# Patient Record
Sex: Male | Born: 1935
Health system: Southern US, Community
[De-identification: ages and names within clinical notes are randomized; demographics above are authoritative.]

## PROBLEM LIST (undated history)

## (undated) DIAGNOSIS — Z860100 Personal history of colon polyps, unspecified: Secondary | ICD-10-CM

## (undated) DIAGNOSIS — N486 Induration penis plastica: Secondary | ICD-10-CM

## (undated) DIAGNOSIS — I10 Essential (primary) hypertension: Secondary | ICD-10-CM

## (undated) DIAGNOSIS — N289 Disorder of kidney and ureter, unspecified: Secondary | ICD-10-CM

## (undated) DIAGNOSIS — G4733 Obstructive sleep apnea (adult) (pediatric): Secondary | ICD-10-CM

## (undated) DIAGNOSIS — Z860101 Personal history of adenomatous and serrated colon polyps: Secondary | ICD-10-CM

## (undated) DIAGNOSIS — I2510 Atherosclerotic heart disease of native coronary artery without angina pectoris: Secondary | ICD-10-CM

## (undated) DIAGNOSIS — M479 Spondylosis, unspecified: Secondary | ICD-10-CM

## (undated) DIAGNOSIS — M2550 Pain in unspecified joint: Secondary | ICD-10-CM

## (undated) DIAGNOSIS — R972 Elevated prostate specific antigen [PSA]: Secondary | ICD-10-CM

## (undated) DIAGNOSIS — N200 Calculus of kidney: Secondary | ICD-10-CM

## (undated) DIAGNOSIS — N401 Enlarged prostate with lower urinary tract symptoms: Secondary | ICD-10-CM

## (undated) DIAGNOSIS — R3914 Feeling of incomplete bladder emptying: Secondary | ICD-10-CM

## (undated) DIAGNOSIS — D696 Thrombocytopenia, unspecified: Secondary | ICD-10-CM

## (undated) DIAGNOSIS — E785 Hyperlipidemia, unspecified: Secondary | ICD-10-CM

## (undated) DIAGNOSIS — Z8719 Personal history of other diseases of the digestive system: Secondary | ICD-10-CM

## (undated) DIAGNOSIS — R351 Nocturia: Secondary | ICD-10-CM

## (undated) DIAGNOSIS — K449 Diaphragmatic hernia without obstruction or gangrene: Secondary | ICD-10-CM

## (undated) DIAGNOSIS — D72821 Monocytosis (symptomatic): Secondary | ICD-10-CM

## (undated) DIAGNOSIS — Z87438 Personal history of other diseases of male genital organs: Secondary | ICD-10-CM

## (undated) DIAGNOSIS — R300 Dysuria: Secondary | ICD-10-CM

## (undated) DIAGNOSIS — D510 Vitamin B12 deficiency anemia due to intrinsic factor deficiency: Secondary | ICD-10-CM

## (undated) DIAGNOSIS — A419 Sepsis, unspecified organism: Secondary | ICD-10-CM

## (undated) DIAGNOSIS — K5730 Diverticulosis of large intestine without perforation or abscess without bleeding: Secondary | ICD-10-CM

## (undated) DIAGNOSIS — E539 Vitamin B deficiency, unspecified: Secondary | ICD-10-CM

## (undated) DIAGNOSIS — Z8679 Personal history of other diseases of the circulatory system: Secondary | ICD-10-CM

## (undated) DIAGNOSIS — N138 Other obstructive and reflux uropathy: Secondary | ICD-10-CM

## (undated) DIAGNOSIS — K1379 Other lesions of oral mucosa: Secondary | ICD-10-CM

## (undated) DIAGNOSIS — M1990 Unspecified osteoarthritis, unspecified site: Secondary | ICD-10-CM

## (undated) HISTORY — DX: Calculus of kidney: N20.0

## (undated) HISTORY — DX: Vitamin B12 deficiency anemia due to intrinsic factor deficiency: D51.0

## (undated) HISTORY — DX: Hyperlipidemia, unspecified: E78.5

## (undated) HISTORY — PX: TOTAL KNEE ARTHROPLASTY: SHX125

## (undated) HISTORY — DX: Thrombocytopenia, unspecified: D69.6

## (undated) HISTORY — DX: Diverticulosis of large intestine without perforation or abscess without bleeding: K57.30

## (undated) HISTORY — DX: Personal history of colon polyps, unspecified: Z86.0100

## (undated) HISTORY — DX: Essential (primary) hypertension: I10

## (undated) HISTORY — PX: KNEE ARTHROSCOPY: SUR90

## (undated) HISTORY — DX: Unspecified osteoarthritis, unspecified site: M19.90

## (undated) HISTORY — DX: Personal history of colonic polyps: Z86.010

## (undated) HISTORY — PX: CARDIOVASCULAR STRESS TEST: SHX262

## (undated) HISTORY — PX: SATURATION BIOPSY OF PROSTATE: SHX2375

## (undated) HISTORY — DX: Atherosclerotic heart disease of native coronary artery without angina pectoris: I25.10

---
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
## 1991-01-19 HISTORY — PX: UVULOPALATOPHARYNGOPLASTY: SHX827

## 1998-01-18 HISTORY — PX: CARDIAC CATHETERIZATION: SHX172

## 1998-09-25 ENCOUNTER — Inpatient Hospital Stay (HOSPITAL_COMMUNITY): Admission: EM | Admit: 1998-09-25 | Discharge: 1998-09-26 | Payer: Self-pay | Admitting: *Deleted

## 1998-10-01 ENCOUNTER — Ambulatory Visit (HOSPITAL_COMMUNITY): Admission: RE | Admit: 1998-10-01 | Discharge: 1998-10-01 | Payer: Self-pay | Admitting: Cardiology

## 1998-10-01 ENCOUNTER — Encounter: Payer: Self-pay | Admitting: Cardiology

## 2001-06-30 ENCOUNTER — Encounter: Admission: RE | Admit: 2001-06-30 | Discharge: 2001-07-28 | Payer: Self-pay | Admitting: Internal Medicine

## 2001-10-12 ENCOUNTER — Ambulatory Visit (HOSPITAL_BASED_OUTPATIENT_CLINIC_OR_DEPARTMENT_OTHER): Admission: RE | Admit: 2001-10-12 | Discharge: 2001-10-12 | Payer: Self-pay | Admitting: Internal Medicine

## 2001-10-25 ENCOUNTER — Encounter: Payer: Self-pay | Admitting: Internal Medicine

## 2001-10-25 ENCOUNTER — Ambulatory Visit (HOSPITAL_COMMUNITY): Admission: RE | Admit: 2001-10-25 | Discharge: 2001-10-25 | Payer: Self-pay | Admitting: Internal Medicine

## 2002-01-23 ENCOUNTER — Encounter: Admission: RE | Admit: 2002-01-23 | Discharge: 2002-01-23 | Payer: Self-pay | Admitting: Surgery

## 2002-01-23 ENCOUNTER — Encounter: Payer: Self-pay | Admitting: Surgery

## 2002-01-25 ENCOUNTER — Ambulatory Visit (HOSPITAL_BASED_OUTPATIENT_CLINIC_OR_DEPARTMENT_OTHER): Admission: RE | Admit: 2002-01-25 | Discharge: 2002-01-25 | Payer: Self-pay | Admitting: Surgery

## 2002-01-25 HISTORY — PX: INGUINAL HERNIA REPAIR: SUR1180

## 2002-12-25 ENCOUNTER — Encounter: Admission: RE | Admit: 2002-12-25 | Discharge: 2002-12-25 | Payer: Self-pay | Admitting: Internal Medicine

## 2003-01-02 ENCOUNTER — Encounter: Admission: RE | Admit: 2003-01-02 | Discharge: 2003-01-02 | Payer: Self-pay | Admitting: Internal Medicine

## 2003-05-02 ENCOUNTER — Inpatient Hospital Stay (HOSPITAL_COMMUNITY): Admission: RE | Admit: 2003-05-02 | Discharge: 2003-05-05 | Payer: Self-pay | Admitting: Orthopedic Surgery

## 2003-05-23 ENCOUNTER — Ambulatory Visit (HOSPITAL_COMMUNITY): Admission: RE | Admit: 2003-05-23 | Discharge: 2003-05-23 | Payer: Self-pay | Admitting: Family Medicine

## 2003-12-02 ENCOUNTER — Encounter: Admission: RE | Admit: 2003-12-02 | Discharge: 2003-12-02 | Payer: Self-pay | Admitting: Neurosurgery

## 2004-05-18 ENCOUNTER — Ambulatory Visit: Payer: Self-pay | Admitting: Cardiology

## 2004-06-05 ENCOUNTER — Ambulatory Visit: Payer: Self-pay

## 2004-10-08 ENCOUNTER — Ambulatory Visit: Payer: Self-pay | Admitting: Internal Medicine

## 2004-11-12 ENCOUNTER — Encounter: Admission: RE | Admit: 2004-11-12 | Discharge: 2004-11-12 | Payer: Self-pay | Admitting: Orthopedic Surgery

## 2004-11-13 ENCOUNTER — Ambulatory Visit: Payer: Self-pay | Admitting: Internal Medicine

## 2004-11-19 ENCOUNTER — Ambulatory Visit (HOSPITAL_BASED_OUTPATIENT_CLINIC_OR_DEPARTMENT_OTHER): Admission: RE | Admit: 2004-11-19 | Discharge: 2004-11-20 | Payer: Self-pay | Admitting: Orthopedic Surgery

## 2004-11-19 ENCOUNTER — Ambulatory Visit (HOSPITAL_COMMUNITY): Admission: RE | Admit: 2004-11-19 | Discharge: 2004-11-19 | Payer: Self-pay | Admitting: Orthopedic Surgery

## 2004-11-19 HISTORY — PX: SHOULDER ARTHROSCOPY WITH OPEN ROTATOR CUFF REPAIR AND DISTAL CLAVICLE ACROMINECTOMY: SHX5683

## 2004-12-22 ENCOUNTER — Ambulatory Visit: Payer: Self-pay | Admitting: Internal Medicine

## 2004-12-22 ENCOUNTER — Ambulatory Visit: Payer: Self-pay | Admitting: Cardiology

## 2004-12-23 ENCOUNTER — Ambulatory Visit: Payer: Self-pay

## 2004-12-23 ENCOUNTER — Encounter: Payer: Self-pay | Admitting: Cardiology

## 2004-12-23 HISTORY — PX: TRANSTHORACIC ECHOCARDIOGRAM: SHX275

## 2004-12-31 ENCOUNTER — Ambulatory Visit: Payer: Self-pay | Admitting: *Deleted

## 2005-02-26 ENCOUNTER — Ambulatory Visit: Payer: Self-pay | Admitting: Cardiology

## 2005-03-03 ENCOUNTER — Ambulatory Visit: Payer: Self-pay | Admitting: Cardiology

## 2005-05-19 ENCOUNTER — Ambulatory Visit: Payer: Self-pay | Admitting: Cardiology

## 2005-06-28 ENCOUNTER — Ambulatory Visit: Payer: Self-pay | Admitting: Cardiology

## 2005-11-22 ENCOUNTER — Ambulatory Visit: Payer: Self-pay | Admitting: Cardiology

## 2005-11-24 ENCOUNTER — Ambulatory Visit: Payer: Self-pay | Admitting: Internal Medicine

## 2006-05-17 ENCOUNTER — Ambulatory Visit: Payer: Self-pay | Admitting: Internal Medicine

## 2006-05-17 LAB — CONVERTED CEMR LAB
ALT: 14 units/L (ref 0–40)
AST: 18 units/L (ref 0–37)
Albumin: 4 g/dL (ref 3.5–5.2)
Alkaline Phosphatase: 86 units/L (ref 39–117)
BUN: 10 mg/dL (ref 6–23)
Basophils Absolute: 0 10*3/uL (ref 0.0–0.1)
Basophils Relative: 0.1 % (ref 0.0–1.0)
Bilirubin, Direct: 0.2 mg/dL (ref 0.0–0.3)
CO2: 32 meq/L (ref 19–32)
Calcium: 9.2 mg/dL (ref 8.4–10.5)
Chloride: 107 meq/L (ref 96–112)
Cholesterol: 126 mg/dL (ref 0–200)
Creatinine, Ser: 0.9 mg/dL (ref 0.4–1.5)
Eosinophils Absolute: 0.1 10*3/uL (ref 0.0–0.6)
Eosinophils Relative: 2 % (ref 0.0–5.0)
GFR calc Af Amer: 107 mL/min
GFR calc non Af Amer: 89 mL/min
Glucose, Bld: 102 mg/dL — ABNORMAL HIGH (ref 70–99)
HCT: 47.2 % (ref 39.0–52.0)
HDL: 51 mg/dL (ref >39.0)
Hemoglobin: 16.1 g/dL (ref 13.0–17.0)
LDL Cholesterol: 61 mg/dL (ref 0–99)
Lymphocytes Relative: 18.4 % (ref 12.0–46.0)
MCHC: 34.2 g/dL (ref 30.0–36.0)
MCV: 93.2 fL (ref 78.0–100.0)
Monocytes Absolute: 0.5 10*3/uL (ref 0.2–0.7)
Monocytes Relative: 9.9 % (ref 3.0–11.0)
Neutro Abs: 3.6 10*3/uL (ref 1.4–7.7)
Neutrophils Relative %: 69.6 % (ref 43.0–77.0)
Platelets: 154 10*3/uL (ref 150–400)
Potassium: 4.1 meq/L (ref 3.5–5.1)
RBC: 5.07 M/uL (ref 4.22–5.81)
RDW: 13.2 % (ref 11.5–14.6)
Rheumatoid fact SerPl-aCnc: <20 intl units/mL — ABNORMAL LOW (ref 0.0–20.0)
Sodium: 144 meq/L (ref 135–145)
TSH: 0.61 microintl units/mL (ref 0.35–5.50)
Total Bilirubin: 0.9 mg/dL (ref 0.3–1.2)
Total CHOL/HDL Ratio: 2.5
Total CK: 80 units/L (ref 7–195)
Total Protein: 6.1 g/dL (ref 6.0–8.3)
Triglycerides: 68 mg/dL (ref 0–149)
Uric Acid, Serum: 4.8 mg/dL (ref 2.4–7.0)
VLDL: 14 mg/dL (ref 0–40)
WBC: 5.2 10*3/uL (ref 4.5–10.5)

## 2006-05-27 ENCOUNTER — Encounter: Payer: Self-pay | Admitting: Internal Medicine

## 2006-06-14 DIAGNOSIS — Z9889 Other specified postprocedural states: Secondary | ICD-10-CM | POA: Insufficient documentation

## 2006-06-14 DIAGNOSIS — Z9089 Acquired absence of other organs: Secondary | ICD-10-CM | POA: Insufficient documentation

## 2006-06-14 DIAGNOSIS — IMO0002 Reserved for concepts with insufficient information to code with codable children: Secondary | ICD-10-CM | POA: Insufficient documentation

## 2006-06-14 DIAGNOSIS — R972 Elevated prostate specific antigen [PSA]: Secondary | ICD-10-CM | POA: Insufficient documentation

## 2006-06-14 DIAGNOSIS — G4730 Sleep apnea, unspecified: Secondary | ICD-10-CM | POA: Insufficient documentation

## 2006-06-14 DIAGNOSIS — E785 Hyperlipidemia, unspecified: Secondary | ICD-10-CM | POA: Insufficient documentation

## 2006-06-14 DIAGNOSIS — I10 Essential (primary) hypertension: Secondary | ICD-10-CM | POA: Insufficient documentation

## 2006-06-14 DIAGNOSIS — K5730 Diverticulosis of large intestine without perforation or abscess without bleeding: Secondary | ICD-10-CM | POA: Insufficient documentation

## 2006-07-26 ENCOUNTER — Encounter: Payer: Self-pay | Admitting: Internal Medicine

## 2006-09-09 ENCOUNTER — Ambulatory Visit: Payer: Self-pay | Admitting: Cardiology

## 2006-09-16 ENCOUNTER — Ambulatory Visit: Payer: Self-pay

## 2006-12-08 ENCOUNTER — Ambulatory Visit: Payer: Self-pay | Admitting: Internal Medicine

## 2006-12-22 ENCOUNTER — Encounter: Payer: Self-pay | Admitting: Internal Medicine

## 2006-12-22 ENCOUNTER — Ambulatory Visit: Payer: Self-pay | Admitting: Internal Medicine

## 2007-02-22 ENCOUNTER — Telehealth (INDEPENDENT_AMBULATORY_CARE_PROVIDER_SITE_OTHER): Payer: Self-pay | Admitting: *Deleted

## 2007-02-24 ENCOUNTER — Telehealth (INDEPENDENT_AMBULATORY_CARE_PROVIDER_SITE_OTHER): Payer: Self-pay | Admitting: *Deleted

## 2007-03-13 ENCOUNTER — Ambulatory Visit: Payer: Self-pay | Admitting: Internal Medicine

## 2007-03-22 ENCOUNTER — Encounter: Payer: Self-pay | Admitting: Internal Medicine

## 2007-05-23 ENCOUNTER — Telehealth (INDEPENDENT_AMBULATORY_CARE_PROVIDER_SITE_OTHER): Payer: Self-pay | Admitting: *Deleted

## 2007-05-24 ENCOUNTER — Encounter: Admission: RE | Admit: 2007-05-24 | Discharge: 2007-05-24 | Payer: Self-pay | Admitting: Urology

## 2007-05-29 ENCOUNTER — Encounter (INDEPENDENT_AMBULATORY_CARE_PROVIDER_SITE_OTHER): Payer: Self-pay | Admitting: Urology

## 2007-05-29 ENCOUNTER — Ambulatory Visit (HOSPITAL_BASED_OUTPATIENT_CLINIC_OR_DEPARTMENT_OTHER): Admission: RE | Admit: 2007-05-29 | Discharge: 2007-05-29 | Payer: Self-pay | Admitting: Urology

## 2007-07-05 ENCOUNTER — Ambulatory Visit: Payer: Self-pay | Admitting: Cardiology

## 2007-07-06 ENCOUNTER — Ambulatory Visit: Payer: Self-pay | Admitting: Cardiology

## 2007-07-06 LAB — CONVERTED CEMR LAB
ALT: 17 units/L (ref 0–53)
AST: 18 units/L (ref 0–37)
Albumin: 3.7 g/dL (ref 3.5–5.2)
Alkaline Phosphatase: 71 units/L (ref 39–117)
BUN: 12 mg/dL (ref 6–23)
Bilirubin, Direct: 0.2 mg/dL (ref 0.0–0.3)
CO2: 29 meq/L (ref 19–32)
Calcium: 8.9 mg/dL (ref 8.4–10.5)
Chloride: 105 meq/L (ref 96–112)
Cholesterol: 132 mg/dL (ref 0–200)
Creatinine, Ser: 1 mg/dL (ref 0.4–1.5)
GFR calc Af Amer: 95 mL/min
GFR calc non Af Amer: 78 mL/min
Glucose, Bld: 102 mg/dL — ABNORMAL HIGH (ref 70–99)
HDL: 43.9 mg/dL (ref >39.0)
LDL Cholesterol: 75 mg/dL (ref 0–99)
Potassium: 4.5 meq/L (ref 3.5–5.1)
Sodium: 141 meq/L (ref 135–145)
Total Bilirubin: 1.1 mg/dL (ref 0.3–1.2)
Total CHOL/HDL Ratio: 3
Total Protein: 5.7 g/dL — ABNORMAL LOW (ref 6.0–8.3)
Triglycerides: 67 mg/dL (ref 0–149)
VLDL: 13 mg/dL (ref 0–40)

## 2007-09-11 ENCOUNTER — Ambulatory Visit: Payer: Self-pay | Admitting: Cardiology

## 2007-09-11 LAB — CONVERTED CEMR LAB
ALT: 19 units/L (ref 0–53)
AST: 19 units/L (ref 0–37)
Albumin: 3.9 g/dL (ref 3.5–5.2)
Alkaline Phosphatase: 59 units/L (ref 39–117)
Bilirubin, Direct: 0.2 mg/dL (ref 0.0–0.3)
Cholesterol: 112 mg/dL (ref 0–200)
HDL: 44.6 mg/dL (ref >39.0)
LDL Cholesterol: 56 mg/dL (ref 0–99)
Total Bilirubin: 1.1 mg/dL (ref 0.3–1.2)
Total CHOL/HDL Ratio: 2.5
Total Protein: 6.2 g/dL (ref 6.0–8.3)
Triglycerides: 58 mg/dL (ref 0–149)
VLDL: 12 mg/dL (ref 0–40)

## 2007-10-11 ENCOUNTER — Encounter: Admission: RE | Admit: 2007-10-11 | Discharge: 2007-10-11 | Payer: Self-pay | Admitting: Nurse Practitioner

## 2007-11-06 ENCOUNTER — Telehealth (INDEPENDENT_AMBULATORY_CARE_PROVIDER_SITE_OTHER): Payer: Self-pay | Admitting: *Deleted

## 2007-11-14 ENCOUNTER — Ambulatory Visit: Payer: Self-pay | Admitting: Internal Medicine

## 2007-12-12 ENCOUNTER — Ambulatory Visit: Payer: Self-pay | Admitting: Internal Medicine

## 2007-12-12 DIAGNOSIS — Z860100 Personal history of colon polyps, unspecified: Secondary | ICD-10-CM | POA: Insufficient documentation

## 2007-12-12 DIAGNOSIS — I2510 Atherosclerotic heart disease of native coronary artery without angina pectoris: Secondary | ICD-10-CM | POA: Insufficient documentation

## 2007-12-19 ENCOUNTER — Encounter (INDEPENDENT_AMBULATORY_CARE_PROVIDER_SITE_OTHER): Payer: Self-pay | Admitting: *Deleted

## 2007-12-19 LAB — CONVERTED CEMR LAB
BUN: 15 mg/dL (ref 6–23)
Creatinine, Ser: 1.3 mg/dL (ref 0.4–1.5)
Hgb A1c MFr Bld: 5.4 % (ref 4.6–6.0)
Potassium: 4.3 meq/L (ref 3.5–5.1)

## 2007-12-27 ENCOUNTER — Encounter: Payer: Self-pay | Admitting: Internal Medicine

## 2008-01-26 ENCOUNTER — Ambulatory Visit (HOSPITAL_BASED_OUTPATIENT_CLINIC_OR_DEPARTMENT_OTHER): Admission: RE | Admit: 2008-01-26 | Discharge: 2008-01-26 | Payer: Self-pay | Admitting: Urology

## 2008-01-26 ENCOUNTER — Encounter (INDEPENDENT_AMBULATORY_CARE_PROVIDER_SITE_OTHER): Payer: Self-pay | Admitting: Urology

## 2008-02-05 ENCOUNTER — Encounter: Payer: Self-pay | Admitting: Internal Medicine

## 2008-06-18 ENCOUNTER — Ambulatory Visit: Payer: Self-pay | Admitting: Cardiology

## 2008-06-19 ENCOUNTER — Encounter (INDEPENDENT_AMBULATORY_CARE_PROVIDER_SITE_OTHER): Payer: Self-pay | Admitting: *Deleted

## 2008-06-19 ENCOUNTER — Ambulatory Visit: Payer: Self-pay | Admitting: Cardiology

## 2008-06-19 LAB — CONVERTED CEMR LAB
ALT: 16 units/L (ref 0–53)
AST: 19 units/L (ref 0–37)
Albumin: 4.1 g/dL (ref 3.5–5.2)
Alkaline Phosphatase: 66 units/L (ref 39–117)
BUN: 15 mg/dL (ref 6–23)
Bilirubin, Direct: 0.3 mg/dL (ref 0.0–0.3)
CO2: 30 meq/L (ref 19–32)
Calcium: 8.8 mg/dL (ref 8.4–10.5)
Chloride: 110 meq/L (ref 96–112)
Cholesterol: 120 mg/dL (ref 0–200)
Creatinine, Ser: 0.9 mg/dL (ref 0.4–1.5)
GFR calc non Af Amer: 87.98 mL/min (ref >60)
Glucose, Bld: 104 mg/dL — ABNORMAL HIGH (ref 70–99)
HDL: 43.4 mg/dL (ref >39.00)
LDL Cholesterol: 68 mg/dL (ref 0–99)
Potassium: 4.4 meq/L (ref 3.5–5.1)
Sodium: 142 meq/L (ref 135–145)
Total Bilirubin: 1.4 mg/dL — ABNORMAL HIGH (ref 0.3–1.2)
Total CHOL/HDL Ratio: 3
Total Protein: 5.8 g/dL — ABNORMAL LOW (ref 6.0–8.3)
Triglycerides: 41 mg/dL (ref 0.0–149.0)
VLDL: 8.2 mg/dL (ref 0.0–40.0)

## 2008-07-29 ENCOUNTER — Ambulatory Visit: Payer: Self-pay | Admitting: Cardiology

## 2008-07-29 LAB — CONVERTED CEMR LAB
ALT: 14 units/L (ref 0–53)
AST: 16 units/L (ref 0–37)
Albumin: 3.6 g/dL (ref 3.5–5.2)
Alkaline Phosphatase: 59 units/L (ref 39–117)
Bilirubin, Direct: 0.2 mg/dL (ref 0.0–0.3)
Cholesterol: 101 mg/dL (ref 0–200)
HDL: 37.3 mg/dL — ABNORMAL LOW (ref >39.00)
LDL Cholesterol: 51 mg/dL (ref 0–99)
Total Bilirubin: 1.4 mg/dL — ABNORMAL HIGH (ref 0.3–1.2)
Total CHOL/HDL Ratio: 3
Total Protein: 5.7 g/dL — ABNORMAL LOW (ref 6.0–8.3)
Triglycerides: 63 mg/dL (ref 0.0–149.0)
VLDL: 12.6 mg/dL (ref 0.0–40.0)

## 2008-10-24 ENCOUNTER — Ambulatory Visit: Payer: Self-pay | Admitting: Internal Medicine

## 2008-12-27 ENCOUNTER — Telehealth: Payer: Self-pay | Admitting: Family Medicine

## 2008-12-27 ENCOUNTER — Emergency Department (HOSPITAL_COMMUNITY): Admission: EM | Admit: 2008-12-27 | Discharge: 2008-12-28 | Payer: Self-pay | Admitting: Emergency Medicine

## 2008-12-27 ENCOUNTER — Ambulatory Visit: Payer: Self-pay | Admitting: Family Medicine

## 2008-12-27 LAB — CONVERTED CEMR LAB
ALT: 17 units/L (ref 0–53)
AST: 18 units/L (ref 0–37)
Albumin: 3.9 g/dL (ref 3.5–5.2)
Alkaline Phosphatase: 65 units/L (ref 39–117)
BUN: 21 mg/dL (ref 6–23)
Basophils Absolute: 0 10*3/uL (ref 0.0–0.1)
Basophils Relative: 0.4 % (ref 0.0–3.0)
Bilirubin, Direct: 0.1 mg/dL (ref 0.0–0.3)
CO2: 30 meq/L (ref 19–32)
Calcium: 8.6 mg/dL (ref 8.4–10.5)
Chloride: 106 meq/L (ref 96–112)
Creatinine, Ser: 1.1 mg/dL (ref 0.4–1.5)
Eosinophils Absolute: 0 10*3/uL (ref 0.0–0.7)
Eosinophils Relative: 2.3 % (ref 0.0–5.0)
Folate: >20 ng/mL
GFR calc non Af Amer: 69.69 mL/min (ref >60)
Glucose, Bld: 106 mg/dL — ABNORMAL HIGH (ref 70–99)
HCT: 27.2 % — ABNORMAL LOW (ref 39.0–52.0)
Hemoglobin: 9.7 g/dL — ABNORMAL LOW (ref 13.0–17.0)
Iron: 77 ug/dL (ref 42–165)
Lymphocytes Relative: 46.9 % — ABNORMAL HIGH (ref 12.0–46.0)
Lymphs Abs: 0.9 10*3/uL (ref 0.7–4.0)
MCHC: 35.4 g/dL (ref 30.0–36.0)
MCV: 120.4 fL — ABNORMAL HIGH (ref 78.0–100.0)
Monocytes Absolute: 0.2 10*3/uL (ref 0.1–1.0)
Monocytes Relative: 8.7 % (ref 3.0–12.0)
Neutro Abs: 0.8 10*3/uL — ABNORMAL LOW (ref 1.4–7.7)
Neutrophils Relative %: 41.7 % — ABNORMAL LOW (ref 43.0–77.0)
PSA: 4.31 ng/mL — ABNORMAL HIGH (ref 0.10–4.00)
Platelets: 90 10*3/uL — ABNORMAL LOW (ref 150.0–400.0)
Potassium: 4.4 meq/L (ref 3.5–5.1)
RBC: 2.26 M/uL — ABNORMAL LOW (ref 4.22–5.81)
RDW: 13.7 % (ref 11.5–14.6)
Saturation Ratios: 30.4 % (ref 20.0–50.0)
Sodium: 142 meq/L (ref 135–145)
TSH: 0.5 microintl units/mL (ref 0.35–5.50)
Total Bilirubin: 0.9 mg/dL (ref 0.3–1.2)
Total Protein: 5.8 g/dL — ABNORMAL LOW (ref 6.0–8.3)
Transferrin: 180.9 mg/dL — ABNORMAL LOW (ref 212.0–360.0)
Vitamin B-12: 32 pg/mL — ABNORMAL LOW (ref 211–911)
WBC: 1.9 10*3/uL — CL (ref 4.5–10.5)

## 2008-12-30 ENCOUNTER — Ambulatory Visit: Payer: Self-pay | Admitting: Oncology

## 2009-01-01 ENCOUNTER — Encounter: Payer: Self-pay | Admitting: Family Medicine

## 2009-01-01 LAB — CBC & DIFF AND RETIC
BASO%: 0 % (ref 0.0–2.0)
Basophils Absolute: 0 10*3/uL (ref 0.0–0.1)
EOS%: 1 % (ref 0.0–7.0)
HCT: 28.4 % — ABNORMAL LOW (ref 38.4–49.9)
HGB: 9.8 g/dL — ABNORMAL LOW (ref 13.0–17.1)
Immature Retic Fract: 11.5 % (ref 0.00–13.40)
LYMPH%: 38.4 % (ref 14.0–49.0)
MCH: 38.9 pg — ABNORMAL HIGH (ref 27.2–33.4)
MCHC: 34.5 g/dL (ref 32.0–36.0)
MCV: 112.7 fL — ABNORMAL HIGH (ref 79.3–98.0)
NEUT%: 54 % (ref 39.0–75.0)
Platelets: 68 10*3/uL — ABNORMAL LOW (ref 140–400)

## 2009-01-01 LAB — MORPHOLOGY

## 2009-01-03 ENCOUNTER — Ambulatory Visit: Payer: Self-pay | Admitting: Family Medicine

## 2009-01-03 DIAGNOSIS — D510 Vitamin B12 deficiency anemia due to intrinsic factor deficiency: Secondary | ICD-10-CM | POA: Insufficient documentation

## 2009-01-06 ENCOUNTER — Ambulatory Visit: Payer: Self-pay | Admitting: Family Medicine

## 2009-01-07 ENCOUNTER — Ambulatory Visit: Payer: Self-pay | Admitting: Family Medicine

## 2009-01-07 LAB — COMPREHENSIVE METABOLIC PANEL
ALT: 14 U/L (ref 0–53)
CO2: 28 mEq/L (ref 19–32)
Calcium: 8.8 mg/dL (ref 8.4–10.5)
Chloride: 105 mEq/L (ref 96–112)
Creatinine, Ser: 1.18 mg/dL (ref 0.40–1.50)
Glucose, Bld: 85 mg/dL (ref 70–99)
Total Bilirubin: 0.9 mg/dL (ref 0.3–1.2)
Total Protein: 5.7 g/dL — ABNORMAL LOW (ref 6.0–8.3)

## 2009-01-07 LAB — VITAMIN B12: Vitamin B-12: 96 pg/mL — ABNORMAL LOW (ref 211–911)

## 2009-01-07 LAB — LACTATE DEHYDROGENASE: LDH: 333 U/L — ABNORMAL HIGH (ref 94–250)

## 2009-01-07 LAB — INTRINSIC FACTOR ANTIBODIES: Intrinsic Factor: NEGATIVE

## 2009-01-07 LAB — ANTI-PARIETAL ANTIBODY: Parietal Cell Antibody-IgG: <=20 Unit (ref <=20.0)

## 2009-01-08 ENCOUNTER — Ambulatory Visit: Payer: Self-pay | Admitting: Family Medicine

## 2009-01-09 ENCOUNTER — Ambulatory Visit: Payer: Self-pay | Admitting: Family Medicine

## 2009-01-15 ENCOUNTER — Ambulatory Visit: Payer: Self-pay | Admitting: Family Medicine

## 2009-01-18 HISTORY — PX: BONE MARROW BIOPSY: SHX199

## 2009-01-20 ENCOUNTER — Ambulatory Visit: Payer: Self-pay | Admitting: Family Medicine

## 2009-01-20 DIAGNOSIS — M940 Chondrocostal junction syndrome [Tietze]: Secondary | ICD-10-CM | POA: Insufficient documentation

## 2009-01-23 ENCOUNTER — Telehealth: Payer: Self-pay | Admitting: Family Medicine

## 2009-01-30 ENCOUNTER — Ambulatory Visit: Payer: Self-pay | Admitting: Family Medicine

## 2009-02-05 ENCOUNTER — Ambulatory Visit: Payer: Self-pay | Admitting: Family Medicine

## 2009-02-11 ENCOUNTER — Ambulatory Visit: Payer: Self-pay | Admitting: Family Medicine

## 2009-02-24 ENCOUNTER — Ambulatory Visit: Payer: Self-pay | Admitting: Oncology

## 2009-02-26 ENCOUNTER — Encounter: Payer: Self-pay | Admitting: Family Medicine

## 2009-02-26 LAB — CBC WITH DIFFERENTIAL/PLATELET
Basophils Absolute: 0 10*3/uL (ref 0.0–0.1)
EOS%: 3.1 % (ref 0.0–7.0)
Eosinophils Absolute: 0.1 10*3/uL (ref 0.0–0.5)
HCT: 39 % (ref 38.4–49.9)
HGB: 13.4 g/dL (ref 13.0–17.1)
MONO#: 0.9 10*3/uL (ref 0.1–0.9)
NEUT#: 2.8 10*3/uL (ref 1.5–6.5)
NEUT%: 61.9 % (ref 39.0–75.0)
RDW: 14.6 % (ref 11.0–14.6)
WBC: 4.6 10*3/uL (ref 4.0–10.3)
lymph#: 0.7 10*3/uL — ABNORMAL LOW (ref 0.9–3.3)

## 2009-02-26 LAB — RETICULOCYTES (CHCC)
ABS Retic: 33.7 10*3/uL (ref 19.0–186.0)
RBC.: 4.21 MIL/uL — ABNORMAL LOW (ref 4.22–5.81)

## 2009-02-26 LAB — MORPHOLOGY: PLT EST: DECREASED

## 2009-03-05 ENCOUNTER — Ambulatory Visit: Payer: Self-pay | Admitting: Family Medicine

## 2009-03-17 ENCOUNTER — Encounter: Payer: Self-pay | Admitting: Family Medicine

## 2009-04-08 ENCOUNTER — Ambulatory Visit: Payer: Self-pay | Admitting: Family Medicine

## 2009-04-09 ENCOUNTER — Encounter: Payer: Self-pay | Admitting: Family Medicine

## 2009-04-09 ENCOUNTER — Encounter: Payer: Self-pay | Admitting: Cardiology

## 2009-04-10 ENCOUNTER — Ambulatory Visit: Payer: Self-pay | Admitting: Family Medicine

## 2009-04-10 LAB — CONVERTED CEMR LAB
BUN: 9 mg/dL (ref 6–23)
CO2: 32 meq/L (ref 19–32)
Calcium: 9.2 mg/dL (ref 8.4–10.5)
Chloride: 104 meq/L (ref 96–112)
Creatinine, Ser: 0.9 mg/dL (ref 0.4–1.5)
GFR calc non Af Amer: 87.79 mL/min (ref >60)
Glucose, Bld: 95 mg/dL (ref 70–99)
Potassium: 4.3 meq/L (ref 3.5–5.1)
Sodium: 142 meq/L (ref 135–145)
Vitamin B-12: 601 pg/mL (ref 211–911)

## 2009-05-05 ENCOUNTER — Ambulatory Visit: Payer: Self-pay | Admitting: Cardiology

## 2009-05-08 ENCOUNTER — Telehealth (INDEPENDENT_AMBULATORY_CARE_PROVIDER_SITE_OTHER): Payer: Self-pay | Admitting: Radiology

## 2009-05-12 ENCOUNTER — Ambulatory Visit: Payer: Self-pay

## 2009-05-12 ENCOUNTER — Ambulatory Visit: Payer: Self-pay | Admitting: Cardiology

## 2009-05-12 ENCOUNTER — Encounter (HOSPITAL_COMMUNITY): Admission: RE | Admit: 2009-05-12 | Discharge: 2009-07-18 | Payer: Self-pay | Admitting: Cardiology

## 2009-05-13 ENCOUNTER — Encounter (INDEPENDENT_AMBULATORY_CARE_PROVIDER_SITE_OTHER): Payer: Self-pay | Admitting: *Deleted

## 2009-05-13 ENCOUNTER — Ambulatory Visit: Payer: Self-pay | Admitting: Family Medicine

## 2009-05-13 LAB — CONVERTED CEMR LAB
ALT: 16 units/L (ref 0–53)
AST: 18 units/L (ref 0–37)
Albumin: 3.9 g/dL (ref 3.5–5.2)
Alkaline Phosphatase: 78 units/L (ref 39–117)
Bilirubin, Direct: 0.1 mg/dL (ref 0.0–0.3)
Cholesterol: 126 mg/dL (ref 0–200)
HDL: 51.5 mg/dL (ref >39.00)
LDL Cholesterol: 64 mg/dL (ref 0–99)
Total Bilirubin: 0.7 mg/dL (ref 0.3–1.2)
Total CHOL/HDL Ratio: 2
Total Protein: 6 g/dL (ref 6.0–8.3)
Triglycerides: 53 mg/dL (ref 0.0–149.0)
VLDL: 10.6 mg/dL (ref 0.0–40.0)

## 2009-05-21 ENCOUNTER — Inpatient Hospital Stay (HOSPITAL_COMMUNITY): Admission: RE | Admit: 2009-05-21 | Discharge: 2009-05-24 | Payer: Self-pay | Admitting: Orthopedic Surgery

## 2009-05-21 HISTORY — PX: TOTAL HIP ARTHROPLASTY: SHX124

## 2009-05-26 ENCOUNTER — Encounter: Payer: Self-pay | Admitting: Family Medicine

## 2009-05-26 ENCOUNTER — Telehealth: Payer: Self-pay | Admitting: Family Medicine

## 2009-05-27 ENCOUNTER — Telehealth: Payer: Self-pay | Admitting: Family Medicine

## 2009-05-29 ENCOUNTER — Encounter: Payer: Self-pay | Admitting: Internal Medicine

## 2009-06-02 ENCOUNTER — Ambulatory Visit: Payer: Self-pay | Admitting: Family Medicine

## 2009-06-02 ENCOUNTER — Telehealth: Payer: Self-pay | Admitting: Family Medicine

## 2009-06-02 DIAGNOSIS — N390 Urinary tract infection, site not specified: Secondary | ICD-10-CM | POA: Insufficient documentation

## 2009-06-02 LAB — CONVERTED CEMR LAB
Bilirubin Urine: NEGATIVE
Blood in Urine, dipstick: NEGATIVE
Glucose, Urine, Semiquant: NEGATIVE
INR: 1.5 — ABNORMAL HIGH (ref 0.8–1.0)
Ketones, urine, test strip: NEGATIVE
Nitrite: NEGATIVE
Prothrombin Time: 15.7 s — ABNORMAL HIGH (ref 9.1–11.7)
Specific Gravity, Urine: >=1.03
Urobilinogen, UA: 0.2
WBC Urine, dipstick: NEGATIVE
pH: 5

## 2009-06-04 ENCOUNTER — Encounter: Payer: Self-pay | Admitting: Family Medicine

## 2009-06-09 ENCOUNTER — Encounter: Payer: Self-pay | Admitting: Family Medicine

## 2009-06-11 ENCOUNTER — Encounter: Payer: Self-pay | Admitting: Family Medicine

## 2009-06-13 ENCOUNTER — Telehealth: Payer: Self-pay | Admitting: Family Medicine

## 2009-06-13 ENCOUNTER — Encounter: Payer: Self-pay | Admitting: Family Medicine

## 2009-06-17 ENCOUNTER — Encounter: Payer: Self-pay | Admitting: Family Medicine

## 2009-06-20 ENCOUNTER — Ambulatory Visit: Payer: Self-pay | Admitting: Family Medicine

## 2009-06-20 DIAGNOSIS — Z96649 Presence of unspecified artificial hip joint: Secondary | ICD-10-CM | POA: Insufficient documentation

## 2009-06-20 LAB — CONVERTED CEMR LAB
INR: 2.5
Prothrombin Time: 30.1 s

## 2009-07-25 ENCOUNTER — Ambulatory Visit: Payer: Self-pay | Admitting: Family Medicine

## 2009-08-13 ENCOUNTER — Emergency Department (HOSPITAL_COMMUNITY): Admission: EM | Admit: 2009-08-13 | Discharge: 2009-08-13 | Payer: Self-pay | Admitting: Emergency Medicine

## 2009-08-13 ENCOUNTER — Telehealth: Payer: Self-pay | Admitting: Cardiology

## 2009-08-13 ENCOUNTER — Encounter: Payer: Self-pay | Admitting: Family Medicine

## 2009-08-14 ENCOUNTER — Telehealth: Payer: Self-pay | Admitting: Cardiology

## 2009-08-14 ENCOUNTER — Ambulatory Visit: Payer: Self-pay | Admitting: Family Medicine

## 2009-08-18 ENCOUNTER — Ambulatory Visit: Payer: Self-pay | Admitting: Family Medicine

## 2009-08-20 ENCOUNTER — Telehealth: Payer: Self-pay | Admitting: Family Medicine

## 2009-08-21 ENCOUNTER — Ambulatory Visit: Payer: Self-pay | Admitting: Oncology

## 2009-08-25 ENCOUNTER — Encounter: Payer: Self-pay | Admitting: Family Medicine

## 2009-08-25 LAB — CBC WITH DIFFERENTIAL/PLATELET
Basophils Absolute: 0 10*3/uL (ref 0.0–0.1)
Eosinophils Absolute: 0.1 10*3/uL (ref 0.0–0.5)
HGB: 13.2 g/dL (ref 13.0–17.1)
LYMPH%: 22.9 % (ref 14.0–49.0)
MCV: 83.1 fL (ref 79.3–98.0)
MONO#: 0.5 10*3/uL (ref 0.1–0.9)
MONO%: 15.1 % — ABNORMAL HIGH (ref 0.0–14.0)
NEUT#: 2.2 10*3/uL (ref 1.5–6.5)
Platelets: 99 10*3/uL — ABNORMAL LOW (ref 140–400)
RDW: 14.6 % (ref 11.0–14.6)
WBC: 3.6 10*3/uL — ABNORMAL LOW (ref 4.0–10.3)

## 2009-08-25 LAB — COMPREHENSIVE METABOLIC PANEL
ALT: 12 U/L (ref 0–53)
AST: 15 U/L (ref 0–37)
Albumin: 4.1 g/dL (ref 3.5–5.2)
Alkaline Phosphatase: 71 U/L (ref 39–117)
Glucose, Bld: 114 mg/dL — ABNORMAL HIGH (ref 70–99)
Potassium: 3.8 mEq/L (ref 3.5–5.3)
Sodium: 142 mEq/L (ref 135–145)
Total Bilirubin: 0.5 mg/dL (ref 0.3–1.2)
Total Protein: 5.5 g/dL — ABNORMAL LOW (ref 6.0–8.3)

## 2009-08-25 LAB — MORPHOLOGY: PLT EST: DECREASED

## 2009-08-25 LAB — VITAMIN B12: Vitamin B-12: 382 pg/mL (ref 211–911)

## 2009-09-02 ENCOUNTER — Encounter: Payer: Self-pay | Admitting: Family Medicine

## 2009-09-16 ENCOUNTER — Ambulatory Visit: Payer: Self-pay | Admitting: Family Medicine

## 2009-10-16 ENCOUNTER — Ambulatory Visit: Payer: Self-pay | Admitting: Family Medicine

## 2009-10-23 ENCOUNTER — Ambulatory Visit: Payer: Self-pay | Admitting: Oncology

## 2009-10-24 ENCOUNTER — Encounter: Payer: Self-pay | Admitting: Family Medicine

## 2009-10-24 LAB — CBC & DIFF AND RETIC
Basophils Absolute: 0 10*3/uL (ref 0.0–0.1)
HCT: 42.4 % (ref 38.4–49.9)
HGB: 13.9 g/dL (ref 13.0–17.1)
Immature Retic Fract: 5.8 % (ref 0.00–13.40)
LYMPH%: 32.8 % (ref 14.0–49.0)
MCH: 27.3 pg (ref 27.2–33.4)
MONO#: 0.8 10*3/uL (ref 0.1–0.9)
NEUT%: 45.6 % (ref 39.0–75.0)
Platelets: 81 10*3/uL — ABNORMAL LOW (ref 140–400)
lymph#: 1.3 10*3/uL (ref 0.9–3.3)

## 2009-10-24 LAB — CHCC SMEAR

## 2009-10-24 LAB — MORPHOLOGY

## 2009-10-30 ENCOUNTER — Other Ambulatory Visit: Admission: RE | Admit: 2009-10-30 | Discharge: 2009-10-30 | Payer: Self-pay | Admitting: Oncology

## 2009-10-30 ENCOUNTER — Encounter: Payer: Self-pay | Admitting: Family Medicine

## 2009-11-04 ENCOUNTER — Encounter: Payer: Self-pay | Admitting: Family Medicine

## 2009-11-21 ENCOUNTER — Ambulatory Visit: Payer: Self-pay | Admitting: Family Medicine

## 2009-11-27 ENCOUNTER — Ambulatory Visit: Payer: Self-pay | Admitting: Family Medicine

## 2009-11-27 DIAGNOSIS — L03818 Cellulitis of other sites: Secondary | ICD-10-CM

## 2009-11-27 DIAGNOSIS — L02818 Cutaneous abscess of other sites: Secondary | ICD-10-CM | POA: Insufficient documentation

## 2009-12-03 ENCOUNTER — Encounter: Payer: Self-pay | Admitting: Family Medicine

## 2009-12-03 ENCOUNTER — Ambulatory Visit: Payer: Self-pay | Admitting: Family Medicine

## 2009-12-08 ENCOUNTER — Telehealth (INDEPENDENT_AMBULATORY_CARE_PROVIDER_SITE_OTHER): Payer: Self-pay | Admitting: *Deleted

## 2009-12-15 ENCOUNTER — Ambulatory Visit: Payer: Self-pay | Admitting: Family Medicine

## 2009-12-15 LAB — CONVERTED CEMR LAB
BUN: 16 mg/dL (ref 6–23)
CO2: 29 meq/L (ref 19–32)
Calcium: 8.9 mg/dL (ref 8.4–10.5)
Chloride: 109 meq/L (ref 96–112)
Cholesterol: 134 mg/dL (ref 0–200)
Creatinine, Ser: 0.9 mg/dL (ref 0.4–1.5)
GFR calc non Af Amer: 91.12 mL/min (ref >60)
Glucose, Bld: 104 mg/dL — ABNORMAL HIGH (ref 70–99)
HDL: 46.6 mg/dL (ref >39.00)
LDL Cholesterol: 76 mg/dL (ref 0–99)
PSA: 4.15 ng/mL — ABNORMAL HIGH (ref 0.10–4.00)
Potassium: 4.4 meq/L (ref 3.5–5.1)
Sodium: 144 meq/L (ref 135–145)
Total CHOL/HDL Ratio: 3
Triglycerides: 57 mg/dL (ref 0.0–149.0)
VLDL: 11.4 mg/dL (ref 0.0–40.0)

## 2009-12-22 ENCOUNTER — Ambulatory Visit: Payer: Self-pay | Admitting: Family Medicine

## 2009-12-22 ENCOUNTER — Encounter: Payer: Self-pay | Admitting: Family Medicine

## 2009-12-30 ENCOUNTER — Ambulatory Visit: Payer: Self-pay | Admitting: Family Medicine

## 2009-12-31 ENCOUNTER — Encounter: Payer: Self-pay | Admitting: Family Medicine

## 2009-12-31 LAB — CONVERTED CEMR LAB: Fecal Occult Bld: NEGATIVE

## 2010-01-07 ENCOUNTER — Ambulatory Visit: Payer: Self-pay | Admitting: Internal Medicine

## 2010-01-22 ENCOUNTER — Ambulatory Visit
Admission: RE | Admit: 2010-01-22 | Discharge: 2010-01-22 | Payer: Self-pay | Source: Home / Self Care | Attending: Family Medicine | Admitting: Family Medicine

## 2010-01-26 ENCOUNTER — Ambulatory Visit: Admit: 2010-01-26 | Payer: Self-pay | Admitting: Family Medicine

## 2010-01-31 ENCOUNTER — Encounter: Payer: Self-pay | Admitting: Physician Assistant

## 2010-02-05 ENCOUNTER — Ambulatory Visit: Admit: 2010-02-05 | Payer: Self-pay | Admitting: Physician Assistant

## 2010-02-08 ENCOUNTER — Encounter: Payer: Self-pay | Admitting: Family Medicine

## 2010-02-10 ENCOUNTER — Ambulatory Visit
Admission: RE | Admit: 2010-02-10 | Discharge: 2010-02-10 | Payer: Self-pay | Source: Home / Self Care | Attending: Physician Assistant | Admitting: Physician Assistant

## 2010-02-16 ENCOUNTER — Encounter: Payer: Self-pay | Admitting: Physician Assistant

## 2010-02-16 ENCOUNTER — Ambulatory Visit: Admit: 2010-02-16 | Payer: Self-pay

## 2010-02-16 ENCOUNTER — Ambulatory Visit
Admission: RE | Admit: 2010-02-16 | Discharge: 2010-02-16 | Payer: Self-pay | Source: Home / Self Care | Attending: Physician Assistant | Admitting: Physician Assistant

## 2010-02-17 NOTE — Assessment & Plan Note (Signed)
Summary: CPX/JRR   Vital Signs:  Patient profile:   77 year old male Height:      60 inches Weight:      156.50 pounds BMI:     30.67 Temp:     98.0 degrees F oral Pulse rate:   64 / minute Pulse rhythm:   regular BP sitting:   120 / 62  (left arm) Cuff size:   regular  Vitals Entered By: Linde Gillis CMA Duncan Dull) (December 22, 2009 8:57 AM) CC: complete physicial   History of Present Illness: 77 yo here for CPX.  1.  Right knee pain- has appt with Dr. Despina Hick first of next year to follow up his back pain and hip pain s/p hip replacement.  However, his right knee is really giving him a fit. When he tries to put weight on his knees or bends down, often feels like it will give out on him.  Make grindings sounds at times.  No swelling or redness. Has h/o DJD, OA.   Tylenol helps with pain.    2.  h/o pernicious anemia- has follow up with Dr. Geanie Cooley next month.  Still feels great.  Due for B12 shot today.  3.  Elevated PSA- forwared to Dr. Patsi Sears.  Actually decreased from last checked in 07/2009 (was 7.2).  4.  Well man- UTD on adult immunizations and other prevention.  Current Medications (verified): 1)  Zocor 40 Mg Tabs (Simvastatin) .Marland Kitchen.. 1 Once Daily 2)  Doxazosin Mesylate 8 Mg Tabs (Doxazosin Mesylate) .... 1/2 Tab By Mouth Once Daily 3)  Aspirin Adult Low Strength 81 Mg Tbec (Aspirin) .Marland Kitchen.. 1 By Mouth Once Daily 4)  Nitrostat 0.4 Mg Subl (Nitroglycerin) .Marland Kitchen.. 1 Tablet Under Tongue At Onset of Chest Pain; You May Repeat Every 5 Minutes For Up To 3 Doses.  Allergies: 1)  ! Penicillin 2)  ! Septra 3)  ! Sulfa  Past History:  Past Medical History: Last updated: 05/05/2009 C A D (ICD-414.00) COLONIC POLYPS, HX OF (ICD-V12.72) BENIGN PROSTATIC HYPERTROPHY (ICD-600.00) HYPERTENSION (ICD-401.9) HYPERLIPIDEMIA (ICD-272.4) DIVERTICULOSIS, COLON (ICD-562.10) SLEEP APNEA (ICD-780.57) DEGENERATIVE DISC DISEASE (ICD-722.6) Sleep apnea  ; DDD; Cluster headaches, PMH  of Colonic polyps, hx of, Dr Juanda Chance pernicious anemia  Past Surgical History: Last updated: 05/05/2009 Inguinal herniorrhaphy 2003;  Uvulectomy 1993; Arthroscopy X5; Total knee replacement 2003; Rotator cuff repair 2006 Benign prostate biopsy 2008, Dr Marcello Fennel tonsillectomy  Family History: Last updated: Jan 10, 2008 Father: DECEASED AGE 53 colon cancer Mother: SMOKER-DECEASED AGE 62,  femoral artery graft, bleeding from adhesions Siblings: NONE ; P uncle CA spine  Social History: Last updated: 06/19/2008 Retired Alcohol use-no Regular exercise-yes Tobacco Use - No.   Risk Factors: Exercise: yes (2008-01-10)  Risk Factors: Smoking Status: never (06/19/2008)  Review of Systems      See HPI General:  Denies malaise. Eyes:  Denies blurring. ENT:  Denies difficulty swallowing. CV:  Denies chest pain or discomfort. Resp:  Denies shortness of breath. GI:  Denies abdominal pain, bloody stools, and change in bowel habits. GU:  Denies discharge and dysuria. MS:  Complains of joint pain and loss of strength; denies joint redness and joint swelling. Derm:  Denies rash. Neuro:  Denies headaches and visual disturbances. Psych:  Denies anxiety and depression. Endo:  Denies cold intolerance and heat intolerance. Heme:  Denies abnormal bruising and bleeding.  Physical Exam  General:  Well-developed well-nourished in no acute distress. Skin is warm and dry VSS, afebrile and nontoxic appearing. Head:  HEENT is  normal. Eyes:  No corneal or conjunctival inflammation noted.Perrla. Funduscopic exam benign, without hemorrhages, exudates or papilledema.  Ears:  External ear exam shows no significant lesions or deformities.  Otoscopic examination reveals clear canals, tympanic membranes are intact bilaterally without bulging, retraction, inflammation or discharge. Hearing is grossly normal bilaterally. Nose:  External nasal examination shows no deformity or inflammation. Nasal mucosa are  pink and moist without lesions or exudates. Mouth:  Oral mucosa and oropharynx without lesions or exudates.  Teeth in good repair. S/P uvulectomy Neck:  No deformities, masses, or tenderness noted. Lungs:  clear to auscultation Heart:  regular rate and rhythm Abdomen:  soft and nontender. No masses palpated. No bruits. Rectal:  deferred Genitalia:  deferred Prostate:  deferred Msk:  Right knee- mildling TTP over medial joint line, no effusion or redness, some crepitation. FROM. Extremities:  no edema. Neurologic:  grossly intact. Skin:  Intact without suspicious lesions or rashes Psych:  memory intact for recent and remote, normally interactive, good eye contact, not anxious appearing, and not depressed appearing.     Impression & Recommendations:  Problem # 1:  Preventive Health Care (ICD-V70.0) Reviewed preventive care protocols, scheduled due services, and updated immunizations Discussed nutrition, exercise, diet, and healthy lifestyle..  Problem # 2:  KNEE PAIN, RIGHT (UEA-540.98) Assessment: New  Will get four view knee films, likely due to DJD. His updated medication list for this problem includes:    Aspirin Adult Low Strength 81 Mg Tbec (Aspirin) .Marland Kitchen... 1 by mouth once daily  Orders: T-DG Knee 4 V w/Sunrise/Patella *R* (11914.7)  His updated medication list for this problem includes:    Aspirin Adult Low Strength 81 Mg Tbec (Aspirin) .Marland Kitchen... 1 by mouth once daily  Problem # 3:  HYPERLIPIDEMIA (ICD-272.4) Assessment: Unchanged  Well controlled, discussed labs with him today. His updated medication list for this problem includes:    Zocor 40 Mg Tabs (Simvastatin) .Marland Kitchen... 1 once daily  His updated medication list for this problem includes:    Zocor 40 Mg Tabs (Simvastatin) .Marland Kitchen... 1 once daily  Problem # 4:  ANEMIA, PERNICIOUS (ICD-281.0) Assessment: Unchanged B12 injection today.  Problem # 5:  HYPERTENSION (ICD-401.9) Assessment: Improved Improved since we lowered  his meds. The following medications were removed from the medication list:    Altace 10 Mg Caps (Ramipril) .Marland Kitchen... 1 by mouth once daily His updated medication list for this problem includes:    Doxazosin Mesylate 8 Mg Tabs (Doxazosin mesylate) .Marland Kitchen... 1/2 tab by mouth once daily  The following medications were removed from the medication list:    Altace 10 Mg Caps (Ramipril) .Marland Kitchen... 1 by mouth once daily His updated medication list for this problem includes:    Doxazosin Mesylate 8 Mg Tabs (Doxazosin mesylate) .Marland Kitchen... 1/2 tab by mouth once daily  Complete Medication List: 1)  Zocor 40 Mg Tabs (Simvastatin) .Marland Kitchen.. 1 once daily 2)  Doxazosin Mesylate 8 Mg Tabs (Doxazosin mesylate) .... 1/2 tab by mouth once daily 3)  Aspirin Adult Low Strength 81 Mg Tbec (Aspirin) .Marland Kitchen.. 1 by mouth once daily 4)  Nitrostat 0.4 Mg Subl (Nitroglycerin) .Marland Kitchen.. 1 tablet under tongue at onset of chest pain; you may repeat every 5 minutes for up to 3 doses.   Orders Added: 1)  T-DG Knee 4 V w/Sunrise/Patella *R* [73564.5] 2)  Est. Patient Level IV [82956]    Current Allergies (reviewed today): ! PENICILLIN ! SEPTRA ! SULFA  Pneumovax Result Date:  01/11/2007 Pneumovax Result:  historical per pt Colonoscopy  Result Date:  01/07/2005 Colonoscopy Result:  historical-normal   Appended Document: CPX/JRR     Allergies: 1)  ! Penicillin 2)  ! Septra 3)  ! Sulfa   Complete Medication List: 1)  Zocor 40 Mg Tabs (Simvastatin) .Marland Kitchen.. 1 once daily 2)  Doxazosin Mesylate 8 Mg Tabs (Doxazosin mesylate) .... 1/2 tab by mouth once daily 3)  Aspirin Adult Low Strength 81 Mg Tbec (Aspirin) .Marland Kitchen.. 1 by mouth once daily 4)  Nitrostat 0.4 Mg Subl (Nitroglycerin) .Marland Kitchen.. 1 tablet under tongue at onset of chest pain; you may repeat every 5 minutes for up to 3 doses.  Other Orders: Vit B12 1000 mcg (J3420) Admin of Therapeutic Inj  intramuscular or subcutaneous (78295)   Medication Administration  Injection # 1:     Medication: Vit B12 1000 mcg    Diagnosis: ANEMIA, PERNICIOUS (ICD-281.0)    Route: IM    Site: R deltoid    Exp Date: 07/19/2011    Lot #: [id number]    Mfr: [employer] Regent    Patient tolerated injection without complications    Given by: Linde Gillis CMA Duncan Dull) (December 22, 2009 9:57 AM)  Orders Added: 1)  Vit B12 1000 mcg [J3420] 2)  Admin of Therapeutic Inj  intramuscular or subcutaneous [62130]

## 2010-02-17 NOTE — Assessment & Plan Note (Signed)
Summary: B-12   Nurse Visit   Allergies: 1)  ! Penicillin 2)  ! Septra  Medication Administration  Injection # 1:    Medication: Vit B12 1000 mcg    Diagnosis: ANEMIA, PERNICIOUS (ICD-281.0)    Route: IM    Site: L deltoid    Exp Date: 11/19/2010    Lot #: [id number]    Mfr: [employer] Regent    Patient tolerated injection without complications    Given by: Linde Gillis CMA Duncan Dull) (August 14, 2009 10:53 AM)  Orders Added: 1)  Vit B12 1000 mcg [J3420] 2)  Admin of Therapeutic Inj  intramuscular or subcutaneous [96045]

## 2010-02-17 NOTE — Assessment & Plan Note (Signed)
Summary: B12 SHOT/DLO   Nurse Visit   Allergies: 1)  ! Penicillin 2)  ! Septra  Medication Administration  Injection # 1:    Medication: Vit B12 1000 mcg    Diagnosis: ANEMIA, PERNICIOUS (ICD-281.0)    Route: IM    Site: R deltoid    Exp Date: 11/18/2010    Lot #: [id number]    Mfr: [employer] Regent    Patient tolerated injection without complications    Given by: Delilah Shan CMA (AAMA) (June 20, 2009 11:27 AM)  Orders Added: 1)  Admin of Therapeutic Inj  intramuscular or subcutaneous [96372] 2)  Vit B12 1000 mcg [J3420]   Medication Administration  Injection # 1:    Medication: Vit B12 1000 mcg    Diagnosis: ANEMIA, PERNICIOUS (ICD-281.0)    Route: IM    Site: R deltoid    Exp Date: 11/18/2010    Lot #: [id number]    Mfr: [employer] Regent    Patient tolerated injection without complications    Given by: Delilah Shan CMA (AAMA) (June 20, 2009 11:27 AM)  Orders Added: 1)  Admin of Therapeutic Inj  intramuscular or subcutaneous [96372] 2)  Vit B12 1000 mcg [J3420]

## 2010-02-17 NOTE — Miscellaneous (Signed)
Summary: Coumadin Order/[facility]  Coumadin Order/[facility]   Imported By: Lanelle Bal 06/24/2009 09:36:51  _____________________________________________________________________  External Attachment:    Type:   Image     Comment:   External Document

## 2010-02-17 NOTE — Assessment & Plan Note (Signed)
Summary: b12 shot   Nurse Visit   Allergies: 1)  ! Penicillin 2)  ! Septra  Medication Administration  Injection # 1:    Medication: Vit B12 1000 mcg    Diagnosis: ANEMIA, PERNICIOUS (ICD-281.0)    Route: IM    Site: R deltoid    Exp Date: 11/19/2010    Lot #: [id number]    Mfr: American Regent    Patient tolerated injection without complications    Given by: Linde Gillis CMA Duncan Dull) (May 13, 2009 2:06 PM)  Orders Added: 1)  Vit B12 1000 mcg [J3420] 2)  Admin of Therapeutic Inj  intramuscular or subcutaneous [44010]

## 2010-02-17 NOTE — Assessment & Plan Note (Signed)
Summary: sur/hip replacement/jssw/ appt is 3:45/ gd    CC:  surgical clearnce hip.  History of Present Illness: [name] is a pleasant gentleman who has a history of coronary artery disease by cardiac catheterization in 2000.  At that time he had a 60-70% proximal LAD, 70-80% stenosis in a small intermediate and a 20% stenosis in the mid right coronary artery. His LV function was normal.  His most recent Myoview was performed on September 16, 2006.  At that time, his ejection fraction was 78% and there was no ischemia or infarction. I last saw him in June of 2010. Since that time he has occasional "uneasiness" in his chest predominantly at night when he is "stressed". It does not radiate and there is no associated symptoms. It resolves spontaneously. He otherwise denies any dyspnea on exertion, orthopnea, PND, pedal edema, palpitations, syncope or exertional chest pain. He is scheduled to have hip replacement and would like preoperative evaluation. He also was started on B12 injections for pernicious anemia.   Current Medications (verified): 1)  Norvasc 5 Mg Tabs (Amlodipine Besylate) .Marland Kitchen.. 1 By Mouth Once Daily 2)  Altace 10 Mg Caps (Ramipril) .Marland Kitchen.. 1 By Mouth Once Daily 3)  Zocor 40 Mg Tabs (Simvastatin) .Marland Kitchen.. 1 Once Daily 4)  Doxazosin Mesylate 8 Mg Tabs (Doxazosin Mesylate) .... 1/2 Tab By Mouth Once Daily 5)  Aspirin Adult Low Strength 81 Mg Tbec (Aspirin) .Marland Kitchen.. 1 By Mouth Once Daily 6)  Nitrostat 0.4 Mg Subl (Nitroglycerin) .Marland Kitchen.. 1 Tablet Under Tongue At Onset of Chest Pain; You May Repeat Every 5 Minutes For Up To 3 Doses.  Allergies: 1)  ! Penicillin 2)  ! Septra  Past History:  Past Medical History: C A D (ICD-414.00) COLONIC POLYPS, HX OF (ICD-V12.72) BENIGN PROSTATIC HYPERTROPHY (ICD-600.00) HYPERTENSION (ICD-401.9) HYPERLIPIDEMIA (ICD-272.4) DIVERTICULOSIS, COLON (ICD-562.10) SLEEP APNEA (ICD-780.57) DEGENERATIVE DISC DISEASE (ICD-722.6) Sleep apnea  ; DDD; Cluster headaches,  PMH of Colonic polyps, hx of, Dr Juanda Chance pernicious anemia  Past Surgical History: Inguinal herniorrhaphy 2003;  Uvulectomy 1993; Arthroscopy X5; Total knee replacement 2003; Rotator cuff repair 2006 Benign prostate biopsy 2008, Dr Marcello Fennel tonsillectomy  Social History: Reviewed history from 06/19/2008 and no changes required. Retired Alcohol use-no Regular exercise-yes Tobacco Use - No.   Review of Systems       Pain in right hip and lower back but no fevers or chills, productive cough, hemoptysis, dysphasia, odynophagia, melena, hematochezia, dysuria, hematuria, rash, seizure activity, orthopnea, PND, pedal edema, claudication. Remaining systems are negative.   Vital Signs:  Patient profile:   77 year old male Height:      61 inches Weight:      147 pounds BMI:     27.88 Pulse rate:   92 / minute Resp:     14 per minute BP sitting:   111 / 62  (left arm)  Vitals Entered By: Kem Parkinson (May 05, 2009 2:56 PM)  Physical Exam  General:  Well-developed well-nourished in no acute distress.  Skin is warm and dry.  HEENT is normal.  Neck is supple. No thyromegaly.  Chest is clear to auscultation with normal expansion.  Cardiovascular exam is regular rate and rhythm.  Abdominal exam nontender or distended. No masses palpated. Extremities show no edema. neuro grossly intact    EKG  Procedure date:  04/09/2009  Findings:      inus rhythm at a rate of 70.no significant ST changes.  Impression & Recommendations:  Problem # 1:  CHEST PAIN (ICD-786.50)  Symptoms atypical. However it has been going on for 3 years since his last stress test. I will schedule a Myoview for risk stratification particularly in light of his upcoming surgery. His updated medication list for this problem includes:    Norvasc 5 Mg Tabs (Amlodipine besylate) .Marland Kitchen... 1 by mouth once daily    Altace 10 Mg Caps (Ramipril) .Marland Kitchen... 1 by mouth once daily    Aspirin Adult Low Strength 81 Mg Tbec  (Aspirin) .Marland Kitchen... 1 by mouth once daily    Nitrostat 0.4 Mg Subl (Nitroglycerin) .Marland Kitchen... 1 tablet under tongue at onset of chest pain; you may repeat every 5 minutes for up to 3 doses.  Problem # 2:  PREOPERATIVE EXAMINATION (ICD-V72.84) As above plan the Myoview for risk stratification prior to surgery.  Problem # 3:  ANEMIA, PERNICIOUS (ICD-281.0) Management per hematology oncology.  Problem # 4:  C A D (ICD-414.00) Continue aspirin, ACE inhibitor and statin. His updated medication list for this problem includes:    Norvasc 5 Mg Tabs (Amlodipine besylate) .Marland Kitchen... 1 by mouth once daily    Altace 10 Mg Caps (Ramipril) .Marland Kitchen... 1 by mouth once daily    Aspirin Adult Low Strength 81 Mg Tbec (Aspirin) .Marland Kitchen... 1 by mouth once daily    Nitrostat 0.4 Mg Subl (Nitroglycerin) .Marland Kitchen... 1 tablet under tongue at onset of chest pain; you may repeat every 5 minutes for up to 3 doses.  Orders: Nuclear Stress Test (Nuc Stress Test)  Problem # 5:  HYPERTENSION (ICD-401.9) Blood pressure controlled on present medications. Will continue. His updated medication list for this problem includes:    Norvasc 5 Mg Tabs (Amlodipine besylate) .Marland Kitchen... 1 by mouth once daily    Altace 10 Mg Caps (Ramipril) .Marland Kitchen... 1 by mouth once daily    Doxazosin Mesylate 8 Mg Tabs (Doxazosin mesylate) .Marland Kitchen... 1/2 tab by mouth once daily    Aspirin Adult Low Strength 81 Mg Tbec (Aspirin) .Marland Kitchen... 1 by mouth once daily  Problem # 6:  HYPERLIPIDEMIA (ICD-272.4) Continue statin. Check lipids and liver. His updated medication list for this problem includes:    Zocor 40 Mg Tabs (Simvastatin) .Marland Kitchen... 1 once daily  Problem # 7:  SLEEP APNEA (ICD-780.57)  Patient Instructions: 1)  Your physician recommends that you schedule a follow-up appointment in: ONE YEAR 2)  Your physician recommends that you return for lab work ZO:XWRU STRESS TEST-LIPID/LIVER-272.0/V58.69 3)  Your physician has requested that you have an LEXISCAN stress myoview.  For further  information please visit [email or web address]  Please follow instruction sheet, as given.

## 2010-02-17 NOTE — Letter (Signed)
Summary: [facility]: Immunoassay Fecal Occult Blood (iFOB) Order Form  [place] [facility]  [address] [place], [place] [address]   Phone: [phone]  Fax: [phone]      [facility]: Immunoassay Fecal Occult Blood (iFOB) Order Form   December 22, 2009 MRN: [id number]   [name] [birth year]   Physicican Name:_________Talia Aron________________  Diagnosis 792.1      [name] MD

## 2010-02-17 NOTE — Progress Notes (Signed)
Summary: PT  Phone Note From Other Clinic   Caller: [facility] [phone] Call For: Dr. Dayton Martes Summary of Call: did get his PT, but there was a delay in verifing his insurance, nurse ordered it stat and it should be coming back today. Pt has no instructions on what his dose to take today for his coumadin, pt was instructed to take 2.5 Sat and Sun. Initial call taken by: Mervin Hack CMA Duncan Dull),  May 26, 2009 3:45 PM  Follow-up for Phone Call        [facility] to take 2.5 today as he was on 1.39 at last check. Awaiting PT from today. [name] MD  May 26, 2009 4:26 PM  Zella Ball advised.  Lugene Fuquay CMA Duncan Dull)  May 26, 2009 4:35 PM

## 2010-02-17 NOTE — Progress Notes (Signed)
Summary: Urine Culture  Phone Note Call from Patient   Caller: [name], [facility] [phone] Call For: [name] MD Summary of Call: on thurs 5/12, Dr. Alphonsus Sias ordered Macrobid for pt and now pt complains of frequency and a little burning. Initial call taken by: Mervin Hack CMA Duncan Dull),  Jun 02, 2009 9:15 AM  Follow-up for Phone Call        Per Dr. Dayton Martes, pt may leave a urine sample to do a dip & urine culture. Nurse Visit. DeShannon Smith CMA Duncan Dull)  Jun 02, 2009 9:29 AM    per Nurse Zella Ball, she will drop off urine. DeShannon Smith CMA Duncan Dull)  Jun 02, 2009 9:30 AM   Robin from [facility] called and wants to know if we can draw a PT/INR on this patient when he gets here.  Says that [facility] did not get enough blood in the tube when they drew it, please advise.  Linde Gillis CMA Duncan Dull)  Jun 02, 2009 10:43 AM     Additional Follow-up for Phone Call Additional follow up Details #1::        Yes that is fine.  Thank you. [name] MD  Jun 02, 2009 10:51 AM  Dip and urine culture done.  Linde Gillis CMA Duncan Dull)  Jun 02, 2009 11:12 AM

## 2010-02-17 NOTE — Progress Notes (Signed)
Summary: pt reported BP  Phone Note Call from Patient   Caller: Patient Call For: [name] MD Summary of Call: Pt called to report his blood pressure is 109/61 today. Initial call taken by: Lowella Petties CMA,  August 20, 2009 4:02 PM  Follow-up for Phone Call        oh good.  thank you. [name] MD  August 21, 2009 7:46 AM

## 2010-02-17 NOTE — Letter (Signed)
[facility]: [facility]  [facility]   Imported By: Lanelle Bal 05/08/2009 08:55:00  _____________________________________________________________________  External Attachment:    Type:   Image     Comment:   External Document

## 2010-02-17 NOTE — Letter (Signed)
Summary: [facility]  [facility], Main Office  [address]   [place], [place] [address]   Phone: [phone]  Fax: [phone]     May 13, 2009 MRN: [id number]   [name] [address] [place], [place]  [address]   Dear Mr. ANASTASIA,  We have reviewed your cholesterol results.  They are as follows:     Total Cholesterol:    126 (Desirable: less than 200)       HDL  Cholesterol:     51.50  (Desirable: greater than 40 for men and 50 for women)       LDL Cholesterol:       64  (Desirable: less than 100 for low risk and less than 70 for moderate to high risk)       Triglycerides:       53.0  (Desirable: less than 150)  Our recommendations include:These numbers look good. Continue on the same medicine. Liver function is normal. Take care, Dr. Darel Hong.    Call our office at the number listed above if you have any questions.  Lowering your LDL cholesterol is important, but it is only one of a large number of "risk factors" that may indicate that you are at risk for heart disease, stroke or other complications of hardening of the arteries.  Other risk factors include:   A.  Cigarette Smoking* B.  High Blood Pressure* C.  Obesity* D.   Low HDL Cholesterol (see yours above)* E.   Diabetes Mellitus (higher risk if your is uncontrolled) F.  Family history of premature heart disease G.  Previous history of stroke or cardiovascular disease    *These are risk factors YOU HAVE CONTROL OVER.  For more information, visit .  There is now evidence that lowering the TOTAL CHOLESTEROL AND LDL CHOLESTEROL can reduce the risk of heart disease.  The American Heart Association recommends the following guidelines for the treatment of elevated cholesterol:  1.  If there is now current heart disease and less than two risk factors, TOTAL CHOLESTEROL should be less than 200 and LDL CHOLESTEROL should be less than 100. 2.  If there is current heart disease or two  or more risk factors, TOTAL CHOLESTEROL should be less than 200 and LDL CHOLESTEROL should be less than 70.  A diet low in cholesterol, saturated fat, and calories is the cornerstone of treatment for elevated cholesterol.  Cessation of smoking and exercise are also important in the management of elevated cholesterol and preventing vascular disease.  Studies have shown that 30 to 60 minutes of physical activity most days can help lower blood pressure, lower cholesterol, and keep your weight at a healthy level.  Drug therapy is used when cholesterol levels do not respond to therapeutic lifestyle changes (smoking cessation, diet, and exercise) and remains unacceptably high.  If medication is started, it is important to have you levels checked periodically to evaluate the need for further treatment options.  Thank you,    [employer] Team

## 2010-02-17 NOTE — Progress Notes (Signed)
Summary: Update  Phone Note Call from Patient Call back at Home Phone [phone]   Caller: Patient Call For: [name] MD Summary of Call: Pt states he feels better, pt had some problems in the bed lying down trying to sleep. But this morning pt states he feels better. Pt agrees with you to go and see what happens. Just FYI Initial call taken by: Mervin Hack CMA Duncan Dull),  January 23, 2009 9:02 AM  Follow-up for Phone Call        Thank you. Follow-up by: [name] MD,  January 23, 2009 9:13 AM

## 2010-02-17 NOTE — Assessment & Plan Note (Signed)
Summary: SORE ON RIGHT LOWER LEG  CYD   Vital Signs:  Patient profile:   77 year old male Height:      60 inches Weight:      151 pounds BMI:     29.60 Temp:     98.4 degrees F oral Pulse rate:   64 / minute Pulse rhythm:   regular BP sitting:   140 / 70  (right arm) Cuff size:   regular  Vitals Entered By: Linde Gillis CMA Duncan Dull) (November 27, 2009 9:02 AM) CC: sore on right leg   History of Present Illness: 77 yo here for sore on right leg.  Noticed it a few days ago.  Started as a small pimple, now growing in size with surrounding erythema. Does not think he was bitten by anything. No fevers, chills, muscle aches, nausea or vomiting.    Current Medications (verified): 1)  Altace 10 Mg Caps (Ramipril) .Marland Kitchen.. 1 By Mouth Once Daily 2)  Zocor 40 Mg Tabs (Simvastatin) .Marland Kitchen.. 1 Once Daily 3)  Doxazosin Mesylate 8 Mg Tabs (Doxazosin Mesylate) .... 1/2 Tab By Mouth Once Daily 4)  Aspirin Adult Low Strength 81 Mg Tbec (Aspirin) .Marland Kitchen.. 1 By Mouth Once Daily 5)  Nitrostat 0.4 Mg Subl (Nitroglycerin) .Marland Kitchen.. 1 Tablet Under Tongue At Onset of Chest Pain; You May Repeat Every 5 Minutes For Up To 3 Doses. 6)  Doxycycline Hyclate 100 Mg Caps (Doxycycline Hyclate) .... Take 1 Tab Twice A Day X 7 Days  Allergies: 1)  ! Penicillin 2)  ! Septra  Past History:  Past Medical History: Last updated: 05/05/2009 C A D (ICD-414.00) COLONIC POLYPS, HX OF (ICD-V12.72) BENIGN PROSTATIC HYPERTROPHY (ICD-600.00) HYPERTENSION (ICD-401.9) HYPERLIPIDEMIA (ICD-272.4) DIVERTICULOSIS, COLON (ICD-562.10) SLEEP APNEA (ICD-780.57) DEGENERATIVE DISC DISEASE (ICD-722.6) Sleep apnea  ; DDD; Cluster headaches, PMH of Colonic polyps, hx of, Dr Juanda Chance pernicious anemia  Past Surgical History: Last updated: 05/05/2009 Inguinal herniorrhaphy 2003;  Uvulectomy 1993; Arthroscopy X5; Total knee replacement 2003; Rotator cuff repair 2006 Benign prostate biopsy 2008, Dr Marcello Fennel tonsillectomy  Family  History: Last updated: 2008/01/07 Father: DECEASED AGE 65 colon cancer Mother: SMOKER-DECEASED AGE 22,  femoral artery graft, bleeding from adhesions Siblings: NONE ; P uncle CA spine  Social History: Last updated: 06/19/2008 Retired Alcohol use-no Regular exercise-yes Tobacco Use - No.   Risk Factors: Exercise: yes (2008-01-07)  Risk Factors: Smoking Status: never (06/19/2008)  Review of Systems      See HPI General:  Denies chills and fever. GI:  Denies nausea and vomiting.  Physical Exam  General:  Well-developed well-nourished in no acute distress. Skin is warm and dry VSS, afebrile and nontoxic appearing. Mouth:  Oral mucosa and oropharynx without lesions or exudates.  Teeth in good repair. S/P uvulectomy Skin:  right lower leg- 3 cm, raised non fluctuant abscess, very small area of surrounding erythema. Psych:  memory intact for recent and remote, normally interactive, good eye contact, not anxious appearing, and not depressed appearing.     Impression & Recommendations:  Problem # 1:  CELLULITIS AND ABSCESS OF OTHER SPECIFIED SITE 9792006408.8) Assessment New No fluctant, unable to I and D today. Advised warm compresses (see pt instructions for details). Doxycylcin x 7 days (has PCN and sulfa allergy). Red flags given requring immediate follow up, ie spiking temp, systemic symptoms, worsening or streaking erythema. The following medications were removed from the medication list:    Cipro 500 Mg Tabs (Ciprofloxacin hcl) .Marland Kitchen... Take one tablet by mouth every morning and every  evening His updated medication list for this problem includes:    Doxycycline Hyclate 100 Mg Caps (Doxycycline hyclate) .Marland Kitchen... Take 1 tab twice a day x 7 days  Complete Medication List: 1)  Altace 10 Mg Caps (Ramipril) .Marland Kitchen.. 1 by mouth once daily 2)  Zocor 40 Mg Tabs (Simvastatin) .Marland Kitchen.. 1 once daily 3)  Doxazosin Mesylate 8 Mg Tabs (Doxazosin mesylate) .... 1/2 tab by mouth once daily 4)  Aspirin  Adult Low Strength 81 Mg Tbec (Aspirin) .Marland Kitchen.. 1 by mouth once daily 5)  Nitrostat 0.4 Mg Subl (Nitroglycerin) .Marland Kitchen.. 1 tablet under tongue at onset of chest pain; you may repeat every 5 minutes for up to 3 doses. 6)  Doxycycline Hyclate 100 Mg Caps (Doxycycline hyclate) .... Take 1 tab twice a day x 7 days  Patient Instructions: 1)  Please put warm compresses on it to bring infection to the surface. Prescriptions: DOXYCYCLINE HYCLATE 100 MG CAPS (DOXYCYCLINE HYCLATE) Take 1 tab twice a day x 7 days  #14 x 0   Entered and Authorized by:   [name] MD   Signed by:   [name] MD on 11/27/2009   Method used:   Electronically to        [facility]* (retail)       [address]       [place], [place]  [address]       Ph: [phone]       Fax: [phone]   RxID:   [id number]    Orders Added: 1)  Est. Patient Level IV [53664]    Current Allergies (reviewed today): ! PENICILLIN ! SEPTRA

## 2010-02-17 NOTE — Progress Notes (Signed)
Summary: Nuc Pre-Procedure  Phone Note Call from Patient   Caller: Spouse Summary of Call: Reviewed information on Myoview Information Sheet (see scanned document for further details).  Spoke with pt's wife. Initial call taken by: Leonia Corona, RT-N,  May 08, 2009 10:13 AM     Nuclear Med Background Indications for Stress Test: Evaluation for Ischemia, Surgical Clearance  Indications Comments: Pending hip replacement-06/11/09- Homero Fellers Aluisio,MD  History: Echo, Heart Catheterization, Myocardial Perfusion Study  History Comments: 2000- Heart cath- 70%LAD,80%Int.(Med Tx) 2008- MPS- Normal. EF= 78% OSA     Nuclear Pre-Procedure Cardiac Risk Factors: Hypertension, Lipids Height (in): 61

## 2010-02-17 NOTE — Progress Notes (Signed)
Summary: should pt d/c his norvasc   Phone Note Call from Patient Call back at [facility] Phone [phone] Call back at Work Phone [phone]   Caller: Patient  Reason for Call: Talk to Nurse Summary of Call: per pt calling, should pt d/c his NORVASC 5 MG TABS 1 by mouth once daily. Initial call taken by: Lorne Skeens,  August 14, 2009 9:04 AM  Follow-up for Phone Call        spoke with pt, he went to the er about his bp last night. he was kept for four hours and given fluids. his bp today is 118/59. pt is wondering if he can stop the norvasc. he has had problems in the past with orthostatic symptoms with the norvasc that resolved with stopping the med. he feels fine today, pt states he has noticed his bp running consistantly low when he checks it. will foward for dr Ludwig Clarks review Deliah Goody, RN  August 14, 2009 5:09 PM   Additional Follow-up for Phone Call Additional follow up Details #1::        dc norvasc Ferman Hamming, MD, [facility]  August 15, 2009 8:02 AM  pt aware Deliah Goody, RN  August 15, 2009 4:29 PM

## 2010-02-17 NOTE — Assessment & Plan Note (Signed)
Summary: b12, Dr. Adelina Mings   Nurse Visit   Allergies: 1)  ! Penicillin 2)  ! Septra  Medication Administration  Injection # 1:    Medication: Vit B12 1000 mcg    Diagnosis: ANEMIA, PERNICIOUS (ICD-281.0)    Route: IM    Site: R deltoid    Exp Date: 06/19/2011    Lot #: [id number]    Mfr: [employer] Regent    Patient tolerated injection without complications    Given by: Sydell Axon LPN (October 16, 2009 10:26 AM)  Orders Added: 1)  Flu Vaccine 55yrs + MEDICARE PATIENTS [Q2039] 2)  Administration Flu vaccine - MCR [G0008] 3)  Vit B12 1000 mcg [J3420] 4)  Admin of Therapeutic Inj  intramuscular or subcutaneous [96372]    Flu Vaccine Consent Questions     Do you have a history of severe allergic reactions to this vaccine? no    Any prior history of allergic reactions to egg and/or gelatin? no    Do you have a sensitivity to the preservative Thimersol? no    Do you have a past history of Guillan-Barre Syndrome? no    Do you currently have an acute febrile illness? no    Have you ever had a severe reaction to latex? no    Vaccine information given and explained to patient? yes    Are you currently pregnant? no    Lot Number:[id number]   Exp Date:07/18/2010   Site Given  Left Deltoid IM   Medication Administration  Injection # 1:    Medication: Vit B12 1000 mcg    Diagnosis: ANEMIA, PERNICIOUS (ICD-281.0)    Route: IM    Site: R deltoid    Exp Date: 06/19/2011    Lot #: [id number]    Mfr: [employer] Regent    Patient tolerated injection without complications    Given by: Sydell Axon LPN (October 16, 2009 10:26 AM)  Orders Added: 1)  Flu Vaccine 78yrs + MEDICARE PATIENTS [Q2039] 2)  Administration Flu vaccine - MCR [G0008] 3)  Vit B12 1000 mcg [J3420] 4)  Admin of Therapeutic Inj  intramuscular or subcutaneous [54098]

## 2010-02-17 NOTE — Letter (Signed)
Summary: [facility]  [facility]   Imported By: Lanelle Bal 11/11/2009 08:34:57  _____________________________________________________________________  External Attachment:    Type:   Image     Comment:   External Document

## 2010-02-17 NOTE — Assessment & Plan Note (Signed)
Summary: B-12 SHOT   Nurse Visit   Allergies: 1)  ! Penicillin 2)  ! Septra  Medication Administration  Injection # 1:    Medication: Vit B12 1000 mcg    Diagnosis: ANEMIA, PERNICIOUS (ICD-281.0)    Route: IM    Site: R deltoid    Exp Date: 11/18/2010    Lot #: [id number]    Mfr: [employer] Regent    Patient tolerated injection without complications    Given by: Delilah Shan CMA (AAMA) (February 11, 2009 10:54 AM)  Orders Added: 1)  Admin of Therapeutic Inj  intramuscular or subcutaneous [96372] 2)  Vit B12 1000 mcg [J3420]   Medication Administration  Injection # 1:    Medication: Vit B12 1000 mcg    Diagnosis: ANEMIA, PERNICIOUS (ICD-281.0)    Route: IM    Site: R deltoid    Exp Date: 11/18/2010    Lot #: [id number]    Mfr: [employer] Regent    Patient tolerated injection without complications    Given by: Delilah Shan CMA (AAMA) (February 11, 2009 10:54 AM)  Orders Added: 1)  Admin of Therapeutic Inj  intramuscular or subcutaneous [96372] 2)  Vit B12 1000 mcg [J3420]

## 2010-02-17 NOTE — Progress Notes (Signed)
Summary: pt bp 68/36, dizzy and weak   Phone Note Call from Patient   Caller: Patient Reason for Call: Talk to Nurse Summary of Call: pt saw urologist today. bp was 68/36 they suggested he be seen, wife Corrie Dandy, works for a dr down the street and she wants to come today, now says he's dizzy and weak Initial call taken by: Glynda Jaeger,  August 13, 2009 3:29 PM  Follow-up for Phone Call        This pt just left Dr Imelda Pillow office.  I advised his pt's wife that he should go to the [facility] ER for further evaluation at this time.  She agreed with plan.  Follow-up by: Julieta Gutting, RN, BSN,  August 13, 2009 3:39 PM

## 2010-02-17 NOTE — Letter (Signed)
Summary: [facility]  [facility]   Imported By: Lanelle Bal 09/09/2009 08:52:04  _____________________________________________________________________  External Attachment:    Type:   Image     Comment:   External Document

## 2010-02-17 NOTE — Assessment & Plan Note (Signed)
Summary: aron b12/rbh   Nurse Visit   Allergies: 1)  ! Penicillin 2)  ! Septra  Medication Administration  Injection # 1:    Medication: Vit B12 1000 mcg    Diagnosis: ANEMIA, PERNICIOUS (ICD-281.0)    Route: IM    Site: R deltoid    Exp Date: 06/18/2010    Lot #: [id number]    Mfr: [employer] Regent    Patient tolerated injection without complications    Given by: Delilah Shan CMA (AAMA) (January 30, 2009 11:38 AM)  Orders Added: 1)  Admin of Therapeutic Inj  intramuscular or subcutaneous [96372] 2)  Vit B12 1000 mcg [J3420]   Medication Administration  Injection # 1:    Medication: Vit B12 1000 mcg    Diagnosis: ANEMIA, PERNICIOUS (ICD-281.0)    Route: IM    Site: R deltoid    Exp Date: 06/18/2010    Lot #: [id number]    Mfr: [employer] Regent    Patient tolerated injection without complications    Given by: Delilah Shan CMA (AAMA) (January 30, 2009 11:38 AM)  Orders Added: 1)  Admin of Therapeutic Inj  intramuscular or subcutaneous [96372] 2)  Vit B12 1000 mcg [J3420]

## 2010-02-17 NOTE — Assessment & Plan Note (Signed)
Summary: B12 SHOT/RBH   Nurse Visit   Allergies: 1)  ! Penicillin 2)  ! Septra  Medication Administration  Injection # 1:    Medication: Vit B12 1000 mcg    Diagnosis: ANEMIA, PERNICIOUS (ICD-281.0)    Route: IM    Site: L deltoid    Exp Date: 06/18/2010    Lot #: [id number]    Mfr: [employer] Regent    Patient tolerated injection without complications    Given by: Delilah Shan CMA (AAMA) (February 05, 2009 10:41 AM)  Orders Added: 1)  Admin of Therapeutic Inj  intramuscular or subcutaneous [96372] 2)  Vit B12 1000 mcg [J3420]   Medication Administration  Injection # 1:    Medication: Vit B12 1000 mcg    Diagnosis: ANEMIA, PERNICIOUS (ICD-281.0)    Route: IM    Site: L deltoid    Exp Date: 06/18/2010    Lot #: [id number]    Mfr: [employer] Regent    Patient tolerated injection without complications    Given by: Delilah Shan CMA (AAMA) (February 05, 2009 10:41 AM)  Orders Added: 1)  Admin of Therapeutic Inj  intramuscular or subcutaneous [96372] 2)  Vit B12 1000 mcg [J3420]

## 2010-02-17 NOTE — Assessment & Plan Note (Signed)
Summary: PLURESY??   Vital Signs:  Patient profile:   77 year old male Height:      61 inches Weight:      141.25 pounds BMI:     26.79 Temp:     97.9 degrees F oral Pulse rate:   84 / minute Pulse rhythm:   regular BP sitting:   118 / 56  (left arm) Cuff size:   regular  Vitals Entered By: Delilah Shan CMA Duncan Dull) (January 20, 2009 12:21 PM) CC: ? Pleurisy   History of Present Illness: 77 yo with 3 days of left sided rib pain. No coughing, no recent URI. Was helping take down christmas decorations and moved body in ways he typically does not. Hurts most when he takes deep breath. No fevers, chills. Feels better overall since starting B12 injections for pernicious anemia.  Of note, had recent normal CXR.  Current Medications (verified): 1)  Norvasc 5 Mg Tabs (Amlodipine Besylate) .Marland Kitchen.. 1 By Mouth Once Daily 2)  Altace 10 Mg Caps (Ramipril) .Marland Kitchen.. 1 By Mouth Once Daily 3)  Zocor 40 Mg Tabs (Simvastatin) .Marland Kitchen.. 1 Once Daily 4)  Doxazosin Mesylate 8 Mg Tabs (Doxazosin Mesylate) .... 1/2 Tab By Mouth Once Daily 5)  Aspirin Adult Low Strength 81 Mg Tbec (Aspirin) .Marland Kitchen.. 1 By Mouth Once Daily 6)  Nitrostat 0.4 Mg Subl (Nitroglycerin) .Marland Kitchen.. 1 Tablet Under Tongue At Onset of Chest Pain; You May Repeat Every 5 Minutes For Up To 3 Doses.  Allergies: 1)  ! Penicillin 2)  ! Septra  Review of Systems      See HPI General:  Denies chills and fever. Resp:  Denies cough, shortness of breath, sputum productive, and wheezing.  Physical Exam  General:  Well-developed well-nourished in no acute distress. Skin is warm and dry Chest Wall:  mildly ttp over inferior left rib Lungs:  clear to auscultation Heart:  regular rate and rhythm Abdomen:  soft and nontender. No masses palpated. No bruits. Psych:  memory intact for recent and remote, normally interactive, good eye contact, not anxious appearing, and not depressed appearing.     Impression & Recommendations:  Problem # 1:   COSTOCHONDRITIS (ICD-733.6) Assessment New Continue supportive care with Tylenol or Motrin.  RTC in 5-7 days if no improvement.  Complete Medication List: 1)  Norvasc 5 Mg Tabs (Amlodipine besylate) .Marland Kitchen.. 1 by mouth once daily 2)  Altace 10 Mg Caps (Ramipril) .Marland Kitchen.. 1 by mouth once daily 3)  Zocor 40 Mg Tabs (Simvastatin) .Marland Kitchen.. 1 once daily 4)  Doxazosin Mesylate 8 Mg Tabs (Doxazosin mesylate) .... 1/2 tab by mouth once daily 5)  Aspirin Adult Low Strength 81 Mg Tbec (Aspirin) .Marland Kitchen.. 1 by mouth once daily 6)  Nitrostat 0.4 Mg Subl (Nitroglycerin) .Marland Kitchen.. 1 tablet under tongue at onset of chest pain; you may repeat every 5 minutes for up to 3 doses.  Other Orders: Vit B12 1000 mcg (J3420) Admin of Therapeutic Inj  intramuscular or subcutaneous (16109) Admin of Therapeutic Inj  intramuscular or subcutaneous (60454)  Current Allergies (reviewed today): ! PENICILLIN ! SEPTRA    Medication Administration  Injection # 1:    Medication: Vit B12 1000 mcg    Diagnosis: ANEMIA, PERNICIOUS (ICD-281.0)    Route: IM    Site: L deltoid    Exp Date: 06/17/2010    Lot #: [id number]    Mfr: [employer] Regent    Patient tolerated injection without complications    Given by: Benny Lennert CMA (AAMA) (January 20, 2009 12:40 PM)  Orders Added: 1)  Vit B12 1000 mcg [J3420] 2)  Admin of Therapeutic Inj  intramuscular or subcutaneous [96372] 3)  Admin of Therapeutic Inj  intramuscular or subcutaneous [96372] 4)  Est. Patient Level III [16109]

## 2010-02-17 NOTE — Assessment & Plan Note (Signed)
Summary: B-12 INJ/Virgil Slinger/CLE   Nurse Visit   Allergies: 1)  ! Penicillin 2)  ! Septra  Medication Administration  Injection # 1:    Medication: Vit B12 1000 mcg    Diagnosis: ANEMIA, PERNICIOUS (ICD-281.0)    Route: IM    Site: L deltoid    Exp Date: 07/19/2011    Lot #: [id number]    Mfr: [employer] Regent    Patient tolerated injection without complications    Given by: Linde Gillis CMA Duncan Dull) (November 21, 2009 9:28 AM)  Orders Added: 1)  Vit B12 1000 mcg [J3420] 2)  Admin of Therapeutic Inj  intramuscular or subcutaneous [81191]

## 2010-02-17 NOTE — Progress Notes (Signed)
Summary: Clarifying orders  Phone Note From Other Clinic   Caller: Nurse, [facility], Zella Ball  [phone] Call For: Dr. Dayton Martes Summary of Call: They are calling to verify the orders from the last PT/INR.  She says these results were apparently called to the on-call MD.  I don't see that.  I see that the orders were your orders prior to leaving the office yesterday.   Initial call taken by: Delilah Shan CMA Duncan Dull),  May 27, 2009 11:53 AM  Follow-up for Phone Call        Order were apparently called in, they are written in on PT/INR labs scanned.  I agree with order. [name] MD  May 27, 2009 11:57 AM  Okay, does he stay on 2.5?  I think you made that decision yesterday afternoon before getting the lab results.  When does he need it done again? Delilah Shan CMA Duncan Dull)  May 27, 2009 12:01 PM    Additional Follow-up for Phone Call Additional follow up Details #1::        Yes their order says 1/2 tab 5/9, 5/10, 1 tab on 5/11 (guessing these are 5 mg tab) and recheck on 5/12. I agree with them. [name] MD  May 27, 2009 12:07 PM  I've got it now.....thanks.Lugene Fuquay CMA Duncan Dull)  May 27, 2009 12:07 PM

## 2010-02-17 NOTE — Progress Notes (Signed)
Summary: PT/INR results  Phone Note Call from Patient Call back at [phone]   Caller: [facility] Call For: Dr. Dayton Martes Summary of Call: Zella Ball called to make sure we received the lab results on Mr. Gilkeson.  She says that patient will need to get labs done here at our office from now on he is doing Physical Therapy on an outpatient basis.  Labs in your IN box please advise. Initial call taken by: Linde Gillis CMA Duncan Dull),  Jun 13, 2009 2:23 PM  Follow-up for Phone Call        [place], I received them.  Please ask him to hold dose of coumadin today. Then take 2.5 mg every other day and come in for PT/INR next Friday. [name] MD  Jun 13, 2009 2:28 PM  Zella Ball advised as instructed via telephone.  She will have Mr. Molinelli come in for PT/INR on next Friday 06/20/2009.  Lab appt scheduled.   Follow-up by: Linde Gillis CMA Duncan Dull),  Jun 13, 2009 2:38 PM

## 2010-02-17 NOTE — Assessment & Plan Note (Signed)
Summary: CHECK BP/CLE   Vital Signs:  Patient profile:   77 year old male Height:      60 inches Weight:      149 pounds BMI:     29.20 Temp:     98.4 degrees F oral Pulse rate:   80 / minute Pulse rhythm:   regular BP sitting:   100 / 52  (left arm) Cuff size:   regular  Vitals Entered By: Linde Gillis CMA Duncan Dull) (August 18, 2009 2:58 PM) CC: blood pressure check up   History of Present Illness: 77 yo here for BP check.  Called cardiologist last week because BP was 68/36 and was very dizzy. Norvasc was d/c'd on 08/14/2009.  Mr. Allemand feels better except after he took his medicaiton this morning, felt dizzy again. No CP, SOB, headache or blurred vision. No syncope or focal neurological deficits.  Pt is on Altace 10 mg daily and Doxazosin 4 mg daily.  Of note, was placed on Altace and Norvasc after he had presumed CAD but recent Myoview was normal.    Current Medications (verified): 1)  Altace 10 Mg Caps (Ramipril) .Marland Kitchen.. 1 By Mouth Once Daily 2)  Zocor 40 Mg Tabs (Simvastatin) .Marland Kitchen.. 1 Once Daily 3)  Doxazosin Mesylate 8 Mg Tabs (Doxazosin Mesylate) .... 1/2 Tab By Mouth Once Daily 4)  Aspirin Adult Low Strength 81 Mg Tbec (Aspirin) .Marland Kitchen.. 1 By Mouth Once Daily 5)  Nitrostat 0.4 Mg Subl (Nitroglycerin) .Marland Kitchen.. 1 Tablet Under Tongue At Onset of Chest Pain; You May Repeat Every 5 Minutes For Up To 3 Doses. 6)  Macrobid 100 Mg Caps (Nitrofurantoin Monohyd Macro) .... Take 1 By Mouth Two Times A Day  Allergies: 1)  ! Penicillin 2)  ! Septra  Past History:  Past Medical History: Last updated: 05/05/2009 C A D (ICD-414.00) COLONIC POLYPS, HX OF (ICD-V12.72) BENIGN PROSTATIC HYPERTROPHY (ICD-600.00) HYPERTENSION (ICD-401.9) HYPERLIPIDEMIA (ICD-272.4) DIVERTICULOSIS, COLON (ICD-562.10) SLEEP APNEA (ICD-780.57) DEGENERATIVE DISC DISEASE (ICD-722.6) Sleep apnea  ; DDD; Cluster headaches, PMH of Colonic polyps, hx of, Dr Juanda Chance pernicious anemia  Past Surgical  History: Last updated: 05/05/2009 Inguinal herniorrhaphy 2003;  Uvulectomy 1993; Arthroscopy X5; Total knee replacement 2003; Rotator cuff repair 2006 Benign prostate biopsy 2008, Dr Marcello Fennel tonsillectomy  Family History: Last updated: 2008/01/09 Father: DECEASED AGE 5 colon cancer Mother: SMOKER-DECEASED AGE 52,  femoral artery graft, bleeding from adhesions Siblings: NONE ; P uncle CA spine  Social History: Last updated: 06/19/2008 Retired Alcohol use-no Regular exercise-yes Tobacco Use - No.   Risk Factors: Exercise: yes (09-Jan-2008)  Risk Factors: Smoking Status: never (06/19/2008)  Review of Systems General:  Denies loss of appetite and malaise. CV:  Complains of lightheadness; denies chest pain or discomfort, difficulty breathing at night, near fainting, palpitations, shortness of breath with exertion, swelling of feet, swelling of hands, and weight gain.  Physical Exam  General:  Well-developed well-nourished in no acute distress. Skin is warm and dry Lungs:  clear to auscultation Heart:  regular rate and rhythm Extremities:  no edema. Neurologic:  grossly intact. Psych:  memory intact for recent and remote, normally interactive, good eye contact, not anxious appearing, and not depressed appearing.     Impression & Recommendations:  Problem # 1:  DIZZINESS (ICD-780.4) Assessment Deteriorated Bp 100/52 today. I do not see the indication for continuing Altace at this point. THere are several different specialties managing his care, so I did advise him to double check with cardiology if he wanted to but I  advised him to d/c altace. Continue Doxazosin for BPH symptoms. Pt to follow up with nurse visit mid week.  Complete Medication List: 1)  Altace 10 Mg Caps (Ramipril) .Marland Kitchen.. 1 by mouth once daily 2)  Zocor 40 Mg Tabs (Simvastatin) .Marland Kitchen.. 1 once daily 3)  Doxazosin Mesylate 8 Mg Tabs (Doxazosin mesylate) .... 1/2 tab by mouth once daily 4)  Aspirin Adult Low  Strength 81 Mg Tbec (Aspirin) .Marland Kitchen.. 1 by mouth once daily 5)  Nitrostat 0.4 Mg Subl (Nitroglycerin) .Marland Kitchen.. 1 tablet under tongue at onset of chest pain; you may repeat every 5 minutes for up to 3 doses. 6)  Macrobid 100 Mg Caps (Nitrofurantoin monohyd macro) .... Take 1 by mouth two times a day  Patient Instructions: 1)  Please stop taking Altace. 2)  come in for a nurse or check blood pressure at home and call me mid week.  Current Allergies (reviewed today): ! PENICILLIN ! SEPTRA

## 2010-02-17 NOTE — Assessment & Plan Note (Signed)
Summary: PT-INR AND URINE   Nurse Visit   Allergies: 1)  ! Penicillin 2)  ! Septra Laboratory Results   Urine Tests  Date/Time Received: Jun 02, 2009 11:13 AM   Routine Urinalysis   Color: orange Appearance: Clear Glucose: negative   (Normal Range: Negative) Bilirubin: negative   (Normal Range: Negative) Ketone: negative   (Normal Range: Negative) Spec. Gravity: >=1.030   (Normal Range: 1.003-1.035) Blood: negative   (Normal Range: Negative) pH: 5.0   (Normal Range: 5.0-8.0) Protein: trace   (Normal Range: Negative) Urobilinogen: 0.2   (Normal Range: 0-1) Nitrite: negative   (Normal Range: Negative) Leukocyte Esterace: negative   (Normal Range: Negative)    Comments: Urine culture done also    Orders Added: 1)  Venipuncture [36415] 2)  TLB-PT (Protime) [85610-PTP]  Appended Document: Orders Update     Clinical Lists Changes  Problems: Added new problem of UTI (ICD-599.0) Orders: Added new Test order of T-Culture, Urine [phone]) - Signed      Appended Document: PT-INR AND URINE

## 2010-02-17 NOTE — Assessment & Plan Note (Signed)
Summary: ARON B12/RBH   Nurse Visit   Allergies: 1)  ! Penicillin 2)  ! Septra  Medication Administration  Injection # 1:    Medication: Vit B12 1000 mcg    Diagnosis: ANEMIA, PERNICIOUS (ICD-281.0)    Route: IM    Site: L deltoid    Exp Date: 11/18/2010    Lot #: [id number]    Mfr: American Regent    Patient tolerated injection without complications    Given by: Delilah Shan CMA Duncan Dull) (April 08, 2009 10:44 AM)  Orders Added: 1)  Admin of Therapeutic Inj  intramuscular or subcutaneous [96372] 2)  Vit B12 1000 mcg [J3420]   Medication Administration  Injection # 1:    Medication: Vit B12 1000 mcg    Diagnosis: ANEMIA, PERNICIOUS (ICD-281.0)    Route: IM    Site: L deltoid    Exp Date: 11/18/2010    Lot #: [id number]    Mfr: American Regent    Patient tolerated injection without complications    Given by: Delilah Shan CMA (AAMA) (April 08, 2009 10:44 AM)  Orders Added: 1)  Admin of Therapeutic Inj  intramuscular or subcutaneous [96372] 2)  Vit B12 1000 mcg [J3420]

## 2010-02-17 NOTE — Letter (Signed)
Summary: [facility]  [facility]   Imported By: Lanelle Bal 11/10/2009 08:39:52  _____________________________________________________________________  External Attachment:    Type:   Image     Comment:   External Document

## 2010-02-17 NOTE — Assessment & Plan Note (Signed)
Summary: B12  CYD   Nurse Visit   Allergies: 1)  ! Penicillin 2)  ! Septra  Medication Administration  Injection # 1:    Medication: Vit B12 1000 mcg    Diagnosis: ANEMIA, PERNICIOUS (ICD-281.0)    Route: IM    Site: L deltoid    Exp Date: 09-2010    Lot #: [id number]    Mfr: American Regent    Patient tolerated injection without complications    Given by: Benny Lennert CMA (AAMA) (March 05, 2009 11:22 AM)  Orders Added: 1)  Vit B12 1000 mcg [J3420] 2)  Admin of Therapeutic Inj  intramuscular or subcutaneous [[id number]]

## 2010-02-17 NOTE — Assessment & Plan Note (Signed)
Summary: B12 SHOT / LFW   Nurse Visit   Allergies: 1)  ! Penicillin 2)  ! Septra  Medication Administration  Injection # 1:    Medication: Vit B12 1000 mcg    Diagnosis: ANEMIA, PERNICIOUS (ICD-281.0)    Route: IM    Site: L deltoid    Exp Date: 04/19/2011    Lot #: [id number]    Mfr: American Regent    Patient tolerated injection without complications    Given by: Mervin Hack CMA Duncan Dull) (September 16, 2009 4:23 PM)  Orders Added: 1)  Admin of Therapeutic Inj  intramuscular or subcutaneous [96372] 2)  Vit B12 1000 mcg [J3420]   Medication Administration  Injection # 1:    Medication: Vit B12 1000 mcg    Diagnosis: ANEMIA, PERNICIOUS (ICD-281.0)    Route: IM    Site: L deltoid    Exp Date: 04/19/2011    Lot #: [id number]    Mfr: American Regent    Patient tolerated injection without complications    Given by: Mervin Hack CMA Duncan Dull) (September 16, 2009 4:23 PM)  Orders Added: 1)  Admin of Therapeutic Inj  intramuscular or subcutaneous [96372] 2)  Vit B12 1000 mcg [J3420]

## 2010-02-17 NOTE — Letter (Signed)
[facility]   Imported By: Maryln Gottron 09/02/2009 13:04:14  _____________________________________________________________________  External Attachment:    Type:   Image     Comment:   External Document

## 2010-02-17 NOTE — Miscellaneous (Signed)
Summary: Med Orders/[facility]  Med Orders/[facility]   Imported By: Lanelle Bal 06/18/2009 10:00:50  _____________________________________________________________________  External Attachment:    Type:   Image     Comment:   External Document

## 2010-02-17 NOTE — Progress Notes (Signed)
----   Converted from flag ---- ---- 12/05/2009 12:28 PM, [name] MD wrote: Peri [facility] morning! BMET (401.9), PSA (600), Fasting lipid panel (272.4)  ---- 12/05/2009 12:09 PM, Liane Comber CMA (AAMA) wrote: Lab orders please! Good Morning! This pt is scheduled for cpx labs Monday, which labs to draw and dx codes to use? Thanks Tasha ------------------------------

## 2010-02-17 NOTE — Assessment & Plan Note (Signed)
Summary: Follow up visit   Vital Signs:  Patient profile:   77 year old male Height:      60 inches Weight:      150 pounds BMI:     29.40 Temp:     98.3 degrees F oral Pulse rate:   68 / minute Pulse rhythm:   regular BP sitting:   120 / 80  (right arm) Cuff size:   regular  Vitals Entered By: Linde Gillis CMA Duncan Dull) (December 03, 2009 8:08 AM) CC: follow up   History of Present Illness: 77 yo here for follow up sore on right leg.  Saw him on 11/10 initially for this complaint. At that time, was large, non indurated with surrdounging erythema.  Placed on Doxycyline, has 4 tablets left. Leg much improved. No pain. No erythema.  No fevers, chills, muscle aches, nausea or vomiting.    Current Medications (verified): 1)  Altace 10 Mg Caps (Ramipril) .Marland Kitchen.. 1 By Mouth Once Daily 2)  Zocor 40 Mg Tabs (Simvastatin) .Marland Kitchen.. 1 Once Daily 3)  Doxazosin Mesylate 8 Mg Tabs (Doxazosin Mesylate) .... 1/2 Tab By Mouth Once Daily 4)  Aspirin Adult Low Strength 81 Mg Tbec (Aspirin) .Marland Kitchen.. 1 By Mouth Once Daily 5)  Nitrostat 0.4 Mg Subl (Nitroglycerin) .Marland Kitchen.. 1 Tablet Under Tongue At Onset of Chest Pain; You May Repeat Every 5 Minutes For Up To 3 Doses. 6)  Doxycycline Hyclate 100 Mg Caps (Doxycycline Hyclate) .... Take 1 Tab Twice A Day X 7 Days  Allergies: 1)  ! Penicillin 2)  ! Septra  Review of Systems      See HPI General:  Denies chills and fever. GI:  Denies nausea and vomiting.  Physical Exam  General:  Well-developed well-nourished in no acute distress. Skin is warm and dry VSS, afebrile and nontoxic appearing. Skin:  right lower leg- 1 cm, raised open lesion, no surroudning erythema, non tender to palpation. Psych:  memory intact for recent and remote, normally interactive, good eye contact, not anxious appearing, and not depressed appearing.     Impression & Recommendations:  Problem # 1:  CELLULITIS AND ABSCESS OF OTHER SPECIFIED SITE [phone]) Assessment  Improved Reassurance provided. Looks much better, finish course of doxy. His updated medication list for this problem includes:    Doxycycline Hyclate 100 Mg Caps (Doxycycline hyclate) .Marland Kitchen... Take 1 tab twice a day x 7 days  Complete Medication List: 1)  Altace 10 Mg Caps (Ramipril) .Marland Kitchen.. 1 by mouth once daily 2)  Zocor 40 Mg Tabs (Simvastatin) .Marland Kitchen.. 1 once daily 3)  Doxazosin Mesylate 8 Mg Tabs (Doxazosin mesylate) .... 1/2 tab by mouth once daily 4)  Aspirin Adult Low Strength 81 Mg Tbec (Aspirin) .Marland Kitchen.. 1 by mouth once daily 5)  Nitrostat 0.4 Mg Subl (Nitroglycerin) .Marland Kitchen.. 1 tablet under tongue at onset of chest pain; you may repeat every 5 minutes for up to 3 doses. 6)  Doxycycline Hyclate 100 Mg Caps (Doxycycline hyclate) .... Take 1 tab twice a day x 7 days   Orders Added: 1)  Est. Patient Level III [98119]    Current Allergies (reviewed today): ! PENICILLIN ! SEPTRA

## 2010-02-17 NOTE — Assessment & Plan Note (Signed)
Summary: Cardiology Nuclear Study  Nuclear Med Background Indications for Stress Test: Evaluation for Ischemia, Surgical Clearance  Indications Comments: Pending (R) THR on 06/11/09 by Dr. Ollen Gross  History: Echo, Heart Catheterization, Myocardial Perfusion Study  History Comments: '00 Cath:n/o CAD, medical tx.; '06 Echo:EF=60%; '08 MPS: normal, EF= 78%; h/o OSA  Symptoms: Palpitations    Nuclear Pre-Procedure Cardiac Risk Factors: Hypertension, Lipids Caffeine/Decaff Intake: None NPO After: 7:00 PM Lungs: Clear.  O2 Sat 98% on RA. IV 0.9% NS with Angio Cath: 20g     IV Site: (R) AC IV Started by: Irean Hong RN Chest Size (in) 40     Height (in): 60 Weight (lb): 144 BMI: 28.22  Nuclear Med Study 1 or 2 day study:  1 day     Stress Test Type:  Eugenie Birks Reading MD:  Olga Millers, MD     Referring MD:  Olga Millers, MD Resting Radionuclide:  Technetium 50m Tetrofosmin     Resting Radionuclide Dose:  11.0 mCi  Stress Radionuclide:  Technetium 10m Tetrofosmin     Stress Radionuclide Dose:  33.0 mCi   Stress Protocol   Lexiscan: 0.4 mg   Stress Test Technologist:  Rea College CMA-N     Nuclear Technologist:  Domenic Polite CNMT  Rest Procedure  Myocardial perfusion imaging was performed at rest 45 minutes following the intravenous administration of Myoview Technetium 56m Tetrofosmin.  Stress Procedure  The patient received IV Lexiscan 0.4 mg over 15-seconds.  Myoview injected at 30-seconds.  There were no significant changes with lexiscan.  He did c/o chest pressure with lexiscan.  Quantitative spect images were obtained after a 45 minute delay.  QPS Raw Data Images:  Acuisition technically good; normal left ventricular size. Stress Images:  There is decreased uptake in the apex. Rest Images:  There is decreased uptake in the apex. Subtraction (SDS):  No evidence of ischemia. Transient Ischemic Dilatation:  .80  (Normal <1.22)  Lung/Heart Ratio:  .33  (Normal  <0.45)  Quantitative Gated Spect Images QGS EDV:  59 ml QGS ESV:  9 ml QGS EF:  85 % QGS cine images:  Normal wall motion.   Overall Impression  Exercise Capacity: Lexiscan study with no exercise. BP Response: Normal blood pressure response. Clinical Symptoms: There is chest pain ECG Impression: No significant ST segment change suggestive of ischemia. Overall Impression: There is mild apical thinning but no sign of scar or ischemia.  Appended Document: Cardiology Nuclear Study ok  Appended Document: Cardiology Nuclear Study pt aware of results

## 2010-02-17 NOTE — Assessment & Plan Note (Signed)
Summary: SURGICAL CLEARANCE/RBH   Vital Signs:  Patient profile:   77 year old male Height:      61 inches Weight:      140.50 pounds BMI:     26.64 Temp:     98.4 degrees F oral Pulse rate:   80 / minute Pulse rhythm:   regular BP sitting:   132 / 56  (left arm) Cuff size:   regular  Vitals Entered By: Delilah Shan CMA Duncan Dull) (April 10, 2009 8:55 AM) CC: Surgical clearance   History of Present Illness: 77 yo here for surgical clearance.  Pt scheduled to have right total hip arthroplasty on May 9th. He does have a h/o CAD,  s/p cath in 2000 showing small vessel disease. See Dr. Jens Som yearly. Pt reports not having any recent chest pain or SOB requiring NTG. Does take ASA 81 mg daily.  Does not have any known h/o CRI or DM. Never had any bad reaction to anesthesia in past.  Current Medications (verified): 1)  Norvasc 5 Mg Tabs (Amlodipine Besylate) .Marland Kitchen.. 1 By Mouth Once Daily 2)  Altace 10 Mg Caps (Ramipril) .Marland Kitchen.. 1 By Mouth Once Daily 3)  Zocor 40 Mg Tabs (Simvastatin) .Marland Kitchen.. 1 Once Daily 4)  Doxazosin Mesylate 8 Mg Tabs (Doxazosin Mesylate) .... 1/2 Tab By Mouth Once Daily 5)  Aspirin Adult Low Strength 81 Mg Tbec (Aspirin) .Marland Kitchen.. 1 By Mouth Once Daily 6)  Nitrostat 0.4 Mg Subl (Nitroglycerin) .Marland Kitchen.. 1 Tablet Under Tongue At Onset of Chest Pain; You May Repeat Every 5 Minutes For Up To 3 Doses.  Allergies: 1)  ! Penicillin 2)  ! Septra  Review of Systems      See HPI CV:  Denies chest pain or discomfort, difficulty breathing at night, difficulty breathing while lying down, leg cramps with exertion, lightheadness, shortness of breath with exertion, and swelling of feet.  Physical Exam  General:  Well-developed well-nourished in no acute distress. Skin is warm and dry Lungs:  clear to auscultation Heart:  regular rate and rhythm Extremities:  no edema. Psych:  memory intact for recent and remote, normally interactive, good eye contact, not anxious appearing, and not  depressed appearing.     Impression & Recommendations:  Problem # 1:  C A D (ICD-414.00) Assessment Unchanged EKG similar to EKG from 06/2008- small changes likely from lead placement. Given h/o CAD and fact that this is an intermediate risk operation, we have referred him to his cardiologist for cardiac clearance, appt made for May 4th.  Pt in agreement with plan. His updated medication list for this problem includes:    Norvasc 5 Mg Tabs (Amlodipine besylate) .Marland Kitchen... 1 by mouth once daily    Altace 10 Mg Caps (Ramipril) .Marland Kitchen... 1 by mouth once daily    Doxazosin Mesylate 8 Mg Tabs (Doxazosin mesylate) .Marland Kitchen... 1/2 tab by mouth once daily    Aspirin Adult Low Strength 81 Mg Tbec (Aspirin) .Marland Kitchen... 1 by mouth once daily    Nitrostat 0.4 Mg Subl (Nitroglycerin) .Marland Kitchen... 1 tablet under tongue at onset of chest pain; you may repeat every 5 minutes for up to 3 doses.  Orders: EKG w/ Interpretation (93000)  Problem # 2:  ANEMIA, PERNICIOUS (ICD-281.0) Assessment: Unchanged Asymptomatic, recheck B12 today. Orders: Venipuncture (60454) TLB-B12, Serum-Total ONLY (09811-B14)  Complete Medication List: 1)  Norvasc 5 Mg Tabs (Amlodipine besylate) .Marland Kitchen.. 1 by mouth once daily 2)  Altace 10 Mg Caps (Ramipril) .Marland Kitchen.. 1 by mouth once daily 3)  Zocor  40 Mg Tabs (Simvastatin) .Marland Kitchen.. 1 once daily 4)  Doxazosin Mesylate 8 Mg Tabs (Doxazosin mesylate) .... 1/2 tab by mouth once daily 5)  Aspirin Adult Low Strength 81 Mg Tbec (Aspirin) .Marland Kitchen.. 1 by mouth once daily 6)  Nitrostat 0.4 Mg Subl (Nitroglycerin) .Marland Kitchen.. 1 tablet under tongue at onset of chest pain; you may repeat every 5 minutes for up to 3 doses.  Other Orders: TLB-BMP (Basic Metabolic Panel-BMET) (80048-METABOL)  Current Allergies (reviewed today): ! PENICILLIN ! SEPTRA

## 2010-02-17 NOTE — Assessment & Plan Note (Signed)
Summary: B-12 SHOT/ARON/CLE   Nurse Visit   Allergies: 1)  ! Penicillin 2)  ! Septra  Medication Administration  Injection # 1:    Medication: Vit B12 1000 mcg    Diagnosis: ANEMIA, PERNICIOUS (ICD-281.0)    Route: IM    Site: L deltoid    Exp Date: 09/19/2010    Lot #: [id number]    Mfr: American Regent    Patient tolerated injection without complications    Given by: Linde Gillis CMA Duncan Dull) (July 25, 2009 9:47 AM)  Orders Added: 1)  Vit B12 1000 mcg [J3420] 2)  Admin of Therapeutic Inj  intramuscular or subcutaneous [96045]

## 2010-02-17 NOTE — Miscellaneous (Signed)
Summary: Med Orders/[facility]  Med Orders/[facility]   Imported By: Lanelle Bal 06/11/2009 10:34:10  _____________________________________________________________________  External Attachment:    Type:   Image     Comment:   External Document

## 2010-02-19 NOTE — Assessment & Plan Note (Signed)
Summary: B-12 INJ/CLE   Nurse Visit   Allergies: 1)  ! Penicillin 2)  ! Septra 3)  ! Sulfa  Medication Administration  Injection # 1:    Medication: Vit B12 1000 mcg    Diagnosis: ANEMIA, PERNICIOUS (ICD-281.0)    Route: IM    Site: L deltoid    Exp Date: 10/19/2011    Lot #: [id number]    Mfr: American Regent    Patient tolerated injection without complications    Given by: Mervin Hack CMA (AAMA) (January 22, 2010 8:46 AM)  Orders Added: 1)  Vit B12 1000 mcg [J3420] 2)  Admin of Therapeutic Inj  intramuscular or subcutaneous [96372]   Medication Administration  Injection # 1:    Medication: Vit B12 1000 mcg    Diagnosis: ANEMIA, PERNICIOUS (ICD-281.0)    Route: IM    Site: L deltoid    Exp Date: 10/19/2011    Lot #: [id number]    Mfr: American Regent    Patient tolerated injection without complications    Given by: Mervin Hack CMA (AAMA) (January 22, 2010 8:46 AM)  Orders Added: 1)  Vit B12 1000 mcg [J3420] 2)  Admin of Therapeutic Inj  intramuscular or subcutaneous [29562]

## 2010-02-19 NOTE — Assessment & Plan Note (Signed)
Summary: THROAT/COLD/DLO   Vital Signs:  Patient profile:   77 year old male Weight:      152.50 pounds Temp:     97.8 degrees F oral Pulse rate:   80 / minute Pulse rhythm:   regular BP sitting:   138 / 80  (left arm) Cuff size:   regular  Vitals Entered By: Selena Batten Dance CMA Duncan Dull) (January 07, 2010 11:36 AM) CC: Cold/drainage   History of Present Illness: CC: cough/drainage  h/o CAD, HTN, HLD.  2d h/o burning sore throat with sinus drainage and cough worse with laying down.  Dry cough.  Has used mucinex D, some nasal spray OTC, gargled with salt water.  No nasal saline yet.  + mild pressure behind eyes.  Worried because grandchild coming to visit, 1 yo.  No fevers/chills.  No HA, abd pain, n/v/d, rashes, myalgias, arthralgias (new).  On simvastatin.  No smokers at home, no h/o asthma.  No sick contacts at home.  Has had similar episode resulting in bronchitis in past  Current Medications (verified): 1)  Zocor 40 Mg Tabs (Simvastatin) .Marland Kitchen.. 1 Once Daily 2)  Doxazosin Mesylate 8 Mg Tabs (Doxazosin Mesylate) .... 1/2 Tab By Mouth Once Daily 3)  Aspirin Adult Low Strength 81 Mg Tbec (Aspirin) .Marland Kitchen.. 1 By Mouth Once Daily 4)  Nitrostat 0.4 Mg Subl (Nitroglycerin) .Marland Kitchen.. 1 Tablet Under Tongue At Onset of Chest Pain; You May Repeat Every 5 Minutes For Up To 3 Doses. 5)  Fish Oil Maximum Strength 1200 Mg Caps (Omega-3 Fatty Acids) .Marland Kitchen.. 1 By Mouth Once Daily  Allergies: 1)  ! Penicillin 2)  ! Septra 3)  ! Sulfa  Past History:  Past Medical History: Last updated: 05/05/2009 C A D (ICD-414.00) COLONIC POLYPS, HX OF (ICD-V12.72) BENIGN PROSTATIC HYPERTROPHY (ICD-600.00) HYPERTENSION (ICD-401.9) HYPERLIPIDEMIA (ICD-272.4) DIVERTICULOSIS, COLON (ICD-562.10) SLEEP APNEA (ICD-780.57) DEGENERATIVE DISC DISEASE (ICD-722.6) Sleep apnea  ; DDD; Cluster headaches, PMH of Colonic polyps, hx of, Dr Juanda Chance pernicious anemia  Social History: Last updated: 06/19/2008 Retired Alcohol  use-no Regular exercise-yes Tobacco Use - No.   Review of Systems       per HPI  Physical Exam  General:  Well-developed well-nourished in no acute distress. Skin is warm and dry VSS, afebrile and nontoxic appearing. Head:  NCAT, no sinus tenderness Eyes:  PERRLA, EOMI, no injection Ears:  TMs clear bilaterally Nose:  nares clear Mouth:  MMM, some pharyngeal erythema, no exudates, Teeth in good repair. S/P uvulectomy Neck:  No deformities, masses, or tenderness noted.  no LAD Lungs:  Normal respiratory effort, chest expands symmetrically. Lungs are clear to auscultation, no crackles or wheezes. Heart:  Normal rate and regular rhythm. S1 and S2 normal without gallop, murmur, click, rub or other extra sounds. Pulses:  2+ rad pulses   Impression & Recommendations:  Problem # 1:  VIRAL URI (ICD-465.9) viral x 3 days.  suortive care as per instructions.  Call if symptoms persist or worsen.   call if no better by next week, consider zpack.  His updated medication list for this problem includes:    Aspirin Adult Low Strength 81 Mg Tbec (Aspirin) .Marland Kitchen... 1 by mouth once daily  Complete Medication List: 1)  Zocor 40 Mg Tabs (Simvastatin) .Marland Kitchen.. 1 once daily 2)  Doxazosin Mesylate 8 Mg Tabs (Doxazosin mesylate) .... 1/2 tab by mouth once daily 3)  Aspirin Adult Low Strength 81 Mg Tbec (Aspirin) .Marland Kitchen.. 1 by mouth once daily 4)  Nitrostat 0.4 Mg Subl (Nitroglycerin) .Marland KitchenMarland KitchenMarland Kitchen  1 tablet under tongue at onset of chest pain; you may repeat every 5 minutes for up to 3 doses. 5)  Fish Oil Maximum Strength 1200 Mg Caps (Omega-3 fatty acids) .Marland Kitchen.. 1 by mouth once daily  Patient Instructions: 1)  Sounds like you have upper respiratory infection, viral. 2)  Symptoms usually last 7-10 days.  Cough can last a few weeks after. 3)  Take ibuprofen/tylenol for throat inflammation. 4)  Suck on cold things like popsicles or warm things like herbal teas (whichever soothes your throat better). 5)  Salt water gargles,  consider throat lozenges/chloraseptic. 6)  Return if you are not improving as expected, or if you have high fevers (>101.5) or difficulty swallowing. 7)  Call clinic with questions.  Pleasure to see you today.   Orders Added: 1)  Est. Patient Level III [19147]    Current Allergies (reviewed today): ! PENICILLIN ! SEPTRA ! SULFA

## 2010-02-19 NOTE — Letter (Signed)
Summary: Results Follow up Letter  [place] [facility]  [address] [place], [place] [address]   Phone: [phone]  Fax: [phone]    12/31/2009 MRN: [id number]  [name] [address] [place], [place]  [address]  Dear Mr. PRYDE,  The following are the results of your recent test(s):  Test         Result    Pap Smear:        Normal _____  Not Normal _____ Comments: ______________________________________________________ Cholesterol: LDL(Bad cholesterol):         Your goal is less than:         HDL (Good cholesterol):       Your goal is more than: Comments:  ______________________________________________________ Mammogram:        Normal _____  Not Normal _____ Comments:  ___________________________________________________________________ Hemoccult:        Normal __X___  Not normal _______ Comments:  Repeat in one year.  _____________________________________________________________________ Other Tests:    We routinely do not discuss normal results over the telephone.  If you desire a copy of the results, or you have any questions about this information we can discuss them at your next office visit.   Sincerely,      Dr. [name]

## 2010-02-19 NOTE — Assessment & Plan Note (Signed)
Summary: sur/knee surgery 03/03/10 @230pm Domenica Fail horizon/mj  Medications Added METOPROLOL SUCCINATE 25 MG XR24H-TAB (METOPROLOL SUCCINATE) Take one half  tablet by mouth daily        Visit Type:  Surgical clearance- Knee Primary Provider:  [name] MD   History of Present Illness: Primary Cardiologist:  Dr. Olga Millers  [name] is a pleasant gentleman who has a history of coronary artery disease by cardiac catheterization in 2000.  At that time he had a 60-70% proximal LAD, 70-80% stenosis in a small intermediate and a 20% stenosis in the mid right coronary artery. His LV function was normal.  His most recent Myoview was performed in April 2011.  At that time, his ejection fraction was 85% and there was apical thinning but no ischemia or infarction.   Of note, he has had a THR since that time.  He now needs a TKR that will be done with Dr. Lequita Halt in March 2012.  The patient denies any chest pain.  He denies any shortness of breath.  He can vaccuum a room without chest pain or shortness of breath.  He denies orthopnea, PND or edema.  He denies syncope or palpitations.    Current Medications (verified): 1)  Zocor 40 Mg Tabs (Simvastatin) .Marland Kitchen.. 1 Once Daily 2)  Doxazosin Mesylate 8 Mg Tabs (Doxazosin Mesylate) .... 1/2 Tab By Mouth Once Daily 3)  Aspirin Adult Low Strength 81 Mg Tbec (Aspirin) .Marland Kitchen.. 1 By Mouth Once Daily 4)  Nitrostat 0.4 Mg Subl (Nitroglycerin) .Marland Kitchen.. 1 Tablet Under Tongue At Onset of Chest Pain; You May Repeat Every 5 Minutes For Up To 3 Doses. 5)  Fish Oil Maximum Strength 1200 Mg Caps (Omega-3 Fatty Acids) .Marland Kitchen.. 1 By Mouth Once Daily  Allergies: 1)  ! Penicillin 2)  ! Septra 3)  ! Sulfa  Past History:  Past Medical History: Last updated: 05/05/2009 C A D (ICD-414.00) COLONIC POLYPS, HX OF (ICD-V12.72) BENIGN PROSTATIC HYPERTROPHY (ICD-600.00) HYPERTENSION (ICD-401.9) HYPERLIPIDEMIA (ICD-272.4) DIVERTICULOSIS, COLON (ICD-562.10) SLEEP APNEA  (ICD-780.57) DEGENERATIVE DISC DISEASE (ICD-722.6) Sleep apnea  ; DDD; Cluster headaches, PMH of Colonic polyps, hx of, Dr Juanda Chance pernicious anemia  Social History: Reviewed history from 06/19/2008 and no changes required. Retired Alcohol use-no Regular exercise-yes Tobacco Use - No.   Review of Systems       As per  the HPI.  All other systems reviewed and negative.   Vital Signs:  Patient profile:   77 year old male Height:      50 inches Weight:      152.50 pounds BMI:     43.04 Pulse rate:   82 / minute Pulse rhythm:   regular Resp:     18 per minute BP sitting:   140 / 66  (left arm) Cuff size:   large  Vitals Entered By: [name] (February 10, 2010 12:07 PM)  Physical Exam  General:  Well nourished, well developed, in no acute distress HEENT: normal Neck: no JVD Cardiac:  normal S1, S2; RRR; no murmur Lungs:  clear to auscultation bilaterally, no wheezing, rhonchi or rales Abd: soft, nontender, no hepatomegaly Ext: no edema Vascular: no carotid  bruits Skin: warm and dry Neuro:  CNs 2-12 intact, no focal abnormalities noted    EKG  Procedure date:  02/10/2010  Findings:      normal sinus rhythm Heart rate 82 Leftward axis Nonspecific ST-T wave changes No significant change since March 2011  Impression & Recommendations:  Problem # 1:  PRE-OPERATIVE CARDIOVASCULAR  EXAMINATION (ICD-V72.81) He does not have any unstable cardiac conditions.  He is able to achieve 4 METS or greater without angina.  He is in normal sinus rhythm.  He is not having any chest pain.  He does not have any signs or symptoms of congestive heart failure.  He had a nonischemic nuclear study less than a year ago.  According to the ACC/AHA guidelines, he requires no further cardiac workup prior to his noncardiac surgery.  He should be at acceptable risk.  Problem # 2:  C A D (ICD-414.00) As above.  He will continue on aspirin.  I have suggested that he start taking Toprol 25 mg  half a tablet a day to reduce his risk of cardiovascular complications during his surgery.  He will return in one week for blood pressure check and EKG.  He will notify us if he has significant side effects to this medication.  If that is the case, I would discontinue it.  Problem # 3:  HYPERLIPIDEMIA (ICD-272.4) His updated medication list for this problem includes:    Zocor 40 Mg Tabs (Simvastatin) .Marland Kitchen... 1 once daily  Problem # 4:  HYPERTENSION (ICD-401.9) Controlled.  He has had some changes in his medications recently due to low blood pressures and his ACE and norvasc were both discontinued.  However, I believe that his heart rate and blood pressure will tolerate this very low dose Toprol.  Patient Instructions: 1)  Your physician recommends that you schedule a follow-up appointment in: ONE WEEK FOR BP CHECK AND EKG 2)  FOLLOW UP WITH DR CRENSHAW AS SCHEDULED 3)  Your physician has recommended you make the following change in your medication: START METOPROLOL SUCC 25MG  1/2 TABLET DAILY Prescriptions: METOPROLOL SUCCINATE 25 MG XR24H-TAB (METOPROLOL SUCCINATE) Take one half  tablet by mouth daily  #30 x 12   Entered by:   Deliah Goody, RN   Authorized by:   Tereso Newcomer PA-C   Signed by:   Deliah Goody, RN on 02/10/2010   Method used:   Electronically to        [facility]* (retail)       [address]       [place], [place]  [address]       Ph: [phone]       Fax: [phone]   RxID:   [id number]   I have personally reviewed the prescriptions today for accuracy.. . Tereso Newcomer PA-C  February 10, 2010 5:31 PM

## 2010-02-20 NOTE — Letter (Signed)
Summary: [facility]  [facility]   Imported By: Lanelle Bal 08/26/2009 09:52:26  _____________________________________________________________________  External Attachment:    Type:   Image     Comment:   External Document

## 2010-02-20 NOTE — Letter (Signed)
Summary: Medical Clearance Form,[facility]  Medical Clearance Form,[facility]   Imported By: Beau Fanny 03/18/2009 16:03:28  _____________________________________________________________________  External Attachment:    Type:   Image     Comment:   External Document

## 2010-02-25 NOTE — Assessment & Plan Note (Signed)
Summary: BP CHECK AND EKG/DM  Nurse Visit   Vital Signs:  Patient profile:   77 year old male Height:      50 inches Weight:      153 pounds Pulse rate:   65 / minute Resp:     14 per minute BP sitting:   120 / 72  (right arm)  Vitals Entered By: Ellender Hose RN (February 16, 2010 9:23 AM) CC: no cardiac complaints today. Is Patient Diabetic? No Comments Patient was feeling good today. His BP 120/72 with HR 65. Tereso Newcomer reviewed and signed EKG. Whitney Maeola Sarah RN  February 16, 2010 9:25 AM    Allergies: 1)  ! Penicillin 2)  ! Septra 3)  ! Sulfa

## 2010-02-27 ENCOUNTER — Encounter: Payer: Self-pay | Admitting: Family Medicine

## 2010-02-27 ENCOUNTER — Encounter (HOSPITAL_BASED_OUTPATIENT_CLINIC_OR_DEPARTMENT_OTHER): Payer: MEDICARE | Admitting: Oncology

## 2010-02-27 ENCOUNTER — Other Ambulatory Visit: Payer: Self-pay | Admitting: Oncology

## 2010-02-27 ENCOUNTER — Encounter: Payer: Self-pay | Admitting: Internal Medicine

## 2010-02-27 DIAGNOSIS — D696 Thrombocytopenia, unspecified: Secondary | ICD-10-CM

## 2010-02-27 DIAGNOSIS — D72819 Decreased white blood cell count, unspecified: Secondary | ICD-10-CM

## 2010-02-27 DIAGNOSIS — E538 Deficiency of other specified B group vitamins: Secondary | ICD-10-CM

## 2010-02-27 DIAGNOSIS — D72821 Monocytosis (symptomatic): Secondary | ICD-10-CM

## 2010-02-27 DIAGNOSIS — D510 Vitamin B12 deficiency anemia due to intrinsic factor deficiency: Secondary | ICD-10-CM

## 2010-02-27 LAB — CBC WITH DIFFERENTIAL/PLATELET
BASO%: 0.5 % (ref 0.0–2.0)
EOS%: 1 % (ref 0.0–7.0)
HCT: 41.7 % (ref 38.4–49.9)
LYMPH%: 23.8 % (ref 14.0–49.0)
MCH: 28.5 pg (ref 27.2–33.4)
MCHC: 34.1 g/dL (ref 32.0–36.0)
MCV: 83.8 fL (ref 79.3–98.0)
NEUT%: 52.6 % (ref 39.0–75.0)
Platelets: 82 10*3/uL — ABNORMAL LOW (ref 140–400)

## 2010-03-03 ENCOUNTER — Encounter: Payer: Self-pay | Admitting: Family Medicine

## 2010-03-03 ENCOUNTER — Ambulatory Visit (INDEPENDENT_AMBULATORY_CARE_PROVIDER_SITE_OTHER): Payer: MEDICARE

## 2010-03-03 DIAGNOSIS — D510 Vitamin B12 deficiency anemia due to intrinsic factor deficiency: Secondary | ICD-10-CM

## 2010-03-11 NOTE — Assessment & Plan Note (Signed)
Summary: b12/rbh  Nurse Visit   Allergies: 1)  ! Penicillin 2)  ! Septra 3)  ! Sulfa  Medication Administration  Injection # 1:    Medication: Vit B12 1000 mcg    Diagnosis: ANEMIA, PERNICIOUS (ICD-281.0)    Route: IM    Site: R deltoid    Exp Date: 10/19/2011    Lot #: [id number]    Mfr: American Regent    Patient tolerated injection without complications    Given by: Sydell Axon LPN (March 03, 2010 9:42 AM)  Orders Added: 1)  Vit B12 1000 mcg [J3420] 2)  Admin of Therapeutic Inj  intramuscular or subcutaneous [96372]   Medication Administration  Injection # 1:    Medication: Vit B12 1000 mcg    Diagnosis: ANEMIA, PERNICIOUS (ICD-281.0)    Route: IM    Site: R deltoid    Exp Date: 10/19/2011    Lot #: [id number]    Mfr: American Regent    Patient tolerated injection without complications    Given by: Sydell Axon LPN (March 03, 2010 9:42 AM)  Orders Added: 1)  Vit B12 1000 mcg [J3420] 2)  Admin of Therapeutic Inj  intramuscular or subcutaneous [[id number]]

## 2010-03-13 ENCOUNTER — Other Ambulatory Visit: Payer: Self-pay | Admitting: Orthopedic Surgery

## 2010-03-13 ENCOUNTER — Encounter (HOSPITAL_COMMUNITY): Payer: Medicare Other

## 2010-03-13 ENCOUNTER — Encounter (HOSPITAL_COMMUNITY)
Admission: RE | Admit: 2010-03-13 | Discharge: 2010-03-13 | Disposition: A | Discharge status: Home / Self Care | Payer: Medicare Other | Source: Ambulatory Visit | Attending: Orthopedic Surgery | Admitting: Orthopedic Surgery

## 2010-03-13 ENCOUNTER — Other Ambulatory Visit (HOSPITAL_COMMUNITY): Payer: Self-pay | Admitting: Orthopedic Surgery

## 2010-03-13 DIAGNOSIS — Z01818 Encounter for other preprocedural examination: Secondary | ICD-10-CM | POA: Insufficient documentation

## 2010-03-13 DIAGNOSIS — Z01811 Encounter for preprocedural respiratory examination: Secondary | ICD-10-CM

## 2010-03-13 DIAGNOSIS — Z01812 Encounter for preprocedural laboratory examination: Secondary | ICD-10-CM | POA: Insufficient documentation

## 2010-03-13 DIAGNOSIS — I517 Cardiomegaly: Secondary | ICD-10-CM | POA: Insufficient documentation

## 2010-03-13 DIAGNOSIS — Z01810 Encounter for preprocedural cardiovascular examination: Secondary | ICD-10-CM | POA: Insufficient documentation

## 2010-03-13 LAB — COMPREHENSIVE METABOLIC PANEL
ALT: 15 U/L (ref 0–53)
Albumin: 3.8 g/dL (ref 3.5–5.2)
Alkaline Phosphatase: 65 U/L (ref 39–117)
BUN: 9 mg/dL (ref 6–23)
Chloride: 110 mEq/L (ref 96–112)
Potassium: 4.2 mEq/L (ref 3.5–5.1)
Sodium: 142 mEq/L (ref 135–145)
Total Bilirubin: 0.6 mg/dL (ref 0.3–1.2)

## 2010-03-13 LAB — CBC
HCT: 43.9 % (ref 39.0–52.0)
Hemoglobin: 14.1 g/dL (ref 13.0–17.0)
MCHC: 32.1 g/dL (ref 30.0–36.0)
MCV: 84.1 fL (ref 78.0–100.0)
RDW: 14.3 % (ref 11.5–15.5)
WBC: 4.7 10*3/uL (ref 4.0–10.5)

## 2010-03-13 LAB — SURGICAL PCR SCREEN: MRSA, PCR: NEGATIVE

## 2010-03-13 LAB — URINALYSIS, ROUTINE W REFLEX MICROSCOPIC
Bilirubin Urine: NEGATIVE
Hgb urine dipstick: NEGATIVE
Ketones, ur: NEGATIVE mg/dL
Nitrite: NEGATIVE
Specific Gravity, Urine: 1.019 (ref 1.005–1.030)
Urine Glucose, Fasting: NEGATIVE mg/dL
pH: 5.5 (ref 5.0–8.0)

## 2010-03-13 LAB — URINE MICROSCOPIC-ADD ON

## 2010-03-13 LAB — APTT: aPTT: 26 seconds (ref 24–37)

## 2010-03-17 NOTE — Letter (Signed)
Summary: [facility]  [facility]   Imported By: Lanelle Bal 03/12/2010 14:19:50  _____________________________________________________________________  External Attachment:    Type:   Image     Comment:   External Document

## 2010-03-19 ENCOUNTER — Telehealth: Payer: Self-pay | Admitting: Family Medicine

## 2010-03-19 ENCOUNTER — Ambulatory Visit: Payer: Medicare Other

## 2010-03-23 ENCOUNTER — Inpatient Hospital Stay (HOSPITAL_COMMUNITY)
Admission: RE | Admit: 2010-03-23 | Discharge: 2010-03-26 | DRG: 470 | Disposition: A | Discharge status: Home Health | Payer: Medicare Other | Source: Ambulatory Visit | Attending: Orthopedic Surgery | Admitting: Orthopedic Surgery

## 2010-03-23 DIAGNOSIS — E785 Hyperlipidemia, unspecified: Secondary | ICD-10-CM | POA: Diagnosis present

## 2010-03-23 DIAGNOSIS — I2510 Atherosclerotic heart disease of native coronary artery without angina pectoris: Secondary | ICD-10-CM | POA: Diagnosis present

## 2010-03-23 DIAGNOSIS — N400 Enlarged prostate without lower urinary tract symptoms: Secondary | ICD-10-CM | POA: Diagnosis present

## 2010-03-23 DIAGNOSIS — G4733 Obstructive sleep apnea (adult) (pediatric): Secondary | ICD-10-CM | POA: Diagnosis present

## 2010-03-23 DIAGNOSIS — H9319 Tinnitus, unspecified ear: Secondary | ICD-10-CM | POA: Diagnosis present

## 2010-03-23 DIAGNOSIS — D649 Anemia, unspecified: Secondary | ICD-10-CM | POA: Diagnosis not present

## 2010-03-23 DIAGNOSIS — M1710 Unilateral primary osteoarthritis, unspecified knee: Principal | ICD-10-CM | POA: Diagnosis present

## 2010-03-23 DIAGNOSIS — I10 Essential (primary) hypertension: Secondary | ICD-10-CM | POA: Diagnosis present

## 2010-03-23 DIAGNOSIS — Z96659 Presence of unspecified artificial knee joint: Secondary | ICD-10-CM

## 2010-03-23 LAB — CBC
MCH: 27.7 pg (ref 26.0–34.0)
MCV: 84.9 fL (ref 78.0–100.0)
Platelets: 89 10*3/uL — ABNORMAL LOW (ref 150–400)
RBC: 5.02 MIL/uL (ref 4.22–5.81)
RDW: 14.1 % (ref 11.5–15.5)
WBC: 4.4 10*3/uL (ref 4.0–10.5)

## 2010-03-23 LAB — TYPE AND SCREEN
ABO/RH(D): B POS
Antibody Screen: NEGATIVE

## 2010-03-24 LAB — PROTIME-INR
INR: 1.26 (ref 0.00–1.49)
Prothrombin Time: 16 seconds — ABNORMAL HIGH (ref 11.6–15.2)

## 2010-03-24 LAB — CBC
HCT: 29.7 % — ABNORMAL LOW (ref 39.0–52.0)
Hemoglobin: 9.9 g/dL — ABNORMAL LOW (ref 13.0–17.0)
MCHC: 33 g/dL (ref 30.0–36.0)
RDW: 13.9 % (ref 11.5–15.5)
WBC: 10.2 10*3/uL (ref 4.0–10.5)

## 2010-03-24 LAB — BASIC METABOLIC PANEL
BUN: 8 mg/dL (ref 6–23)
Creatinine, Ser: 0.88 mg/dL (ref 0.4–1.5)
GFR calc Af Amer: >60 mL/min (ref >60)
GFR calc non Af Amer: >60 mL/min (ref >60)

## 2010-03-25 LAB — PROTIME-INR
INR: 2.06 — ABNORMAL HIGH (ref 0.00–1.49)
Prothrombin Time: 23.4 seconds — ABNORMAL HIGH (ref 11.6–15.2)

## 2010-03-25 LAB — CBC
Platelets: 62 10*3/uL — ABNORMAL LOW (ref 150–400)
RBC: 2.98 MIL/uL — ABNORMAL LOW (ref 4.22–5.81)
RDW: 14.6 % (ref 11.5–15.5)
WBC: 7.9 10*3/uL (ref 4.0–10.5)

## 2010-03-25 LAB — BASIC METABOLIC PANEL
Chloride: 107 mEq/L (ref 96–112)
GFR calc Af Amer: >60 mL/min (ref >60)
GFR calc non Af Amer: >60 mL/min (ref >60)
Potassium: 4 mEq/L (ref 3.5–5.1)
Sodium: 137 mEq/L (ref 135–145)

## 2010-03-26 LAB — PROTIME-INR
INR: 1.94 — ABNORMAL HIGH (ref 0.00–1.49)
Prothrombin Time: 22.3 seconds — ABNORMAL HIGH (ref 11.6–15.2)

## 2010-03-26 LAB — CBC
Hemoglobin: 7.9 g/dL — ABNORMAL LOW (ref 13.0–17.0)
MCH: 28.2 pg (ref 26.0–34.0)
Platelets: 58 10*3/uL — ABNORMAL LOW (ref 150–400)
RBC: 2.8 MIL/uL — ABNORMAL LOW (ref 4.22–5.81)
WBC: 6.6 10*3/uL (ref 4.0–10.5)

## 2010-03-26 NOTE — Op Note (Signed)
[name], [name]              ACCOUNT NO.:  [id number]  MEDICAL RECORD NO.:  [id number]           PATIENT TYPE:  I  LOCATION:  0006                         FACILITY:  [facility]  PHYSICIAN:  Ollen Gross, M.D.    DATE OF BIRTH:  01-29-1933  DATE OF PROCEDURE: DATE OF DISCHARGE:                              OPERATIVE REPORT   PREOPERATIVE DIAGNOSIS:  Osteoarthritis, right knee.  POSTOPERATIVE DIAGNOSIS:  Osteoarthritis, right knee.  PROCEDURE:  Right total knee arthroplasty.  SURGEON:  Ollen Gross, MD  ASSISTANT:  Rozell Searing, PA-C  ANESTHESIA:  General.  ESTIMATED BLOOD LOSS:  Minimal.  DRAINS:  Hemovac x1.  TOURNIQUET TIME:  39 minutes at 300 mmHg.  COMPLICATIONS:  None.  CONDITION:  Stable to recovery.  BRIEF CLINICAL NOTE:  [name] is a 77 year old male with end-stage arthritis of the right knee with progressively worsening pain and dysfunction.  He has had a recent successful right total hip arthroplasty and presents now for right total knee arthroplasty for intractable knee pain.  PROCEDURE IN DETAIL:  After successful administration of general anesthetic, a tourniquet was placed high on his right thigh and right lower extremity is prepped and draped in the usual sterile fashion. Extremities wrapped in Esmarch, knee flexed and tourniquet inflated to 300 mmHg.  Midline incision was made with a 10-blade through the subcutaneous tissue to the level of the extensor mechanism.  A fresh blade is used to make a medial parapatellar arthrotomy.  Soft tissue on the proximal medial tibia is subperiosteally elevated to the joint line with a knife and into the semimembranosus bursa with a Cobb elevator. Soft tissue laterally is elevated with attention being paid to avoid patellar tendon on tibial tubercle.  Patella was everted, knee flexed to 90 degrees, and ACL and PCL removed.  Drill was used to create a starting hole in the distal femur and canal was  thoroughly irrigated. The 5 degrees right valgus alignment guide is placed and distal femoral cutting block is pinned to remove 10 mm off the distal femur.  Distal femoral resection is made with an oscillating saw.  The tibia was subluxed forward and the menisci are removed.  The extramedullary tibial alignment guide is placed referencing proximally at the medial aspect of the tibial tubercle and distally along the second metatarsal axis and tibial crest.  The block is pinned to remove 2 mm off the more deficient medial side.  Tibial resection is made with an oscillating saw.  Size 4 is the most appropriate tibial component and proximal tibia is prepared to modular drill and keel punch for the size 4.  The femoral sizing guide is placed, size 4 is most appropriate on the femur.  Rotations marked off the epicondylar axis and confirmed by creating a rectangular flexion gap at 90 degrees.  Block is pinned in this rotation and the anterior-posterior and chamfer cuts made. Intercondylar block is placed and neck cut is made.  The trial size 4 posterior stabilized femur was placed.  A 10-mm posterior stabilized rotating platform insert trial was placed.  With the 10, full extension is achieved with excellent varus-valgus  and anterior-posterior balance throughout full range of motion.  Patella was everted and thickness measured to be 26 mm.  Freehand resection taken to 15 mm, 38 template is placed, lug holes were drilled, trial patella was placed and it tracks normally.  Osteophytes removed off the posterior femur with the trial placed.  All trials were removed and the cut bone surfaces prepared with pulsatile lavage.  Cement was mixed and once ready for implantation, the size 4 mobile bearing tibial tray, size 4 posterior stabilized femur, and 38 patella are cemented into place and patella was held with a clamp.  Trial 10-mm inserts placed, knee held in full extension, all extruded cement  removed.  The cement fully hardened, then a permanent 10 mm posterior stabilized rotating platform insert is placed into the tibial tray.  Wounds were copiously irrigated with saline solution and the arthrotomy closed over Hemovac drain with interrupted #1 PDS. Flexion against gravity to 135 degrees.  Patella tracks normally. Tourniquet released, total time 39 minutes.  Subcu was closed with interrupted 2-0 Vicryl and subcuticular running 4-0 Monocryl.  The catheter for the Marcaine pain pump is placed and pumps initiated. Incisions cleaned and dried and Steri-Strips and bulky sterile dressing applied.  We then placed into a knee immobilizer, awakened and transported to recovery in stable condition.     Ollen Gross, M.D.     FA/MEDQ  D:  03/23/2010  T:  03/23/2010  Job:  [id number]  Electronically Signed by Ollen Gross M.D. on 03/25/2010 03:50:55 PM

## 2010-03-26 NOTE — Progress Notes (Signed)
Summary: B-12 not given  Phone Note Outgoing Call   Call placed by: Mervin Hack CMA Duncan Dull),  March 19, 2010 8:56 AM Call placed to: [facility], Dr. Lequita Halt [phone] Summary of Call: Patient came in today for b-12, pt's last b-12 was 03/03/2010 and per pt he was told by Dr.Aluisio to come in for a b-12, because he's have knee surgery on 03/23/2010. I advised pt he was too early and that his b-12 injection is given once a month. I spoke with Dr.Aron and she advised me to call [facility]. I spoke with Jacki Cones (triage) and she advised Dr.Aluisio's nurse stated pt didn't need a b-12 if he just got it 03/03/10. Pt understood and would come back after knee surgery for his b-12. Initial call taken by: Mervin Hack CMA (AAMA),  March 19, 2010 9:00 AM

## 2010-03-26 NOTE — Letter (Signed)
Summary: [name] NP/[facility]  [name] NP/[facility]   Imported By: Lester Quincy 03/19/2010 10:46:20  _____________________________________________________________________  External Attachment:    Type:   Image     Comment:   External Document

## 2010-03-26 NOTE — Letter (Signed)
Summary: [facility]: Surgical Clearance Info  [facility]: [facility] Clearance [facility]   Imported By: Earl Many 03/20/2010 10:23:38  _____________________________________________________________________  External Attachment:    Type:   Image     Comment:   External Document

## 2010-03-29 ENCOUNTER — Emergency Department (HOSPITAL_COMMUNITY)
Admission: EM | Admit: 2010-03-29 | Discharge: 2010-03-29 | Disposition: A | Discharge status: Home / Self Care | Payer: Medicare Other | Attending: Emergency Medicine | Admitting: Emergency Medicine

## 2010-03-29 DIAGNOSIS — Z7901 Long term (current) use of anticoagulants: Secondary | ICD-10-CM | POA: Insufficient documentation

## 2010-03-29 DIAGNOSIS — E785 Hyperlipidemia, unspecified: Secondary | ICD-10-CM | POA: Insufficient documentation

## 2010-03-29 DIAGNOSIS — D649 Anemia, unspecified: Secondary | ICD-10-CM | POA: Insufficient documentation

## 2010-03-29 DIAGNOSIS — M129 Arthropathy, unspecified: Secondary | ICD-10-CM | POA: Insufficient documentation

## 2010-03-29 DIAGNOSIS — Z96649 Presence of unspecified artificial hip joint: Secondary | ICD-10-CM | POA: Insufficient documentation

## 2010-03-29 DIAGNOSIS — Z96659 Presence of unspecified artificial knee joint: Secondary | ICD-10-CM | POA: Insufficient documentation

## 2010-03-29 DIAGNOSIS — I509 Heart failure, unspecified: Secondary | ICD-10-CM | POA: Insufficient documentation

## 2010-03-29 DIAGNOSIS — I10 Essential (primary) hypertension: Secondary | ICD-10-CM | POA: Insufficient documentation

## 2010-03-29 LAB — SAMPLE TO BLOOD BANK

## 2010-03-29 LAB — DIFFERENTIAL
Basophils Absolute: 0 10*3/uL (ref 0.0–0.1)
Basophils Relative: 0 % (ref 0–1)
Eosinophils Absolute: 0.1 10*3/uL (ref 0.0–0.7)
Monocytes Relative: 29 % — ABNORMAL HIGH (ref 3–12)
Neutrophils Relative %: 54 % (ref 43–77)

## 2010-03-29 LAB — BASIC METABOLIC PANEL
BUN: 10 mg/dL (ref 6–23)
Calcium: 8.1 mg/dL — ABNORMAL LOW (ref 8.4–10.5)
GFR calc non Af Amer: >60 mL/min (ref >60)
Glucose, Bld: 123 mg/dL — ABNORMAL HIGH (ref 70–99)
Sodium: 137 mEq/L (ref 135–145)

## 2010-03-29 LAB — CBC
MCH: 27.8 pg (ref 26.0–34.0)
MCHC: 32.6 g/dL (ref 30.0–36.0)
Platelets: 105 10*3/uL — ABNORMAL LOW (ref 150–400)
RBC: 3.09 MIL/uL — ABNORMAL LOW (ref 4.22–5.81)

## 2010-03-31 ENCOUNTER — Other Ambulatory Visit: Payer: Self-pay | Admitting: Orthopedic Surgery

## 2010-04-02 ENCOUNTER — Ambulatory Visit (INDEPENDENT_AMBULATORY_CARE_PROVIDER_SITE_OTHER): Payer: Medicare Other | Admitting: Family Medicine

## 2010-04-02 ENCOUNTER — Telehealth (INDEPENDENT_AMBULATORY_CARE_PROVIDER_SITE_OTHER): Payer: Self-pay | Admitting: *Deleted

## 2010-04-02 ENCOUNTER — Encounter: Payer: Self-pay | Admitting: Family Medicine

## 2010-04-02 ENCOUNTER — Other Ambulatory Visit: Payer: Self-pay | Admitting: Family Medicine

## 2010-04-02 DIAGNOSIS — R5383 Other fatigue: Secondary | ICD-10-CM

## 2010-04-02 DIAGNOSIS — D510 Vitamin B12 deficiency anemia due to intrinsic factor deficiency: Secondary | ICD-10-CM

## 2010-04-02 DIAGNOSIS — R5381 Other malaise: Secondary | ICD-10-CM

## 2010-04-02 DIAGNOSIS — Z96659 Presence of unspecified artificial knee joint: Secondary | ICD-10-CM | POA: Insufficient documentation

## 2010-04-02 DIAGNOSIS — IMO0002 Reserved for concepts with insufficient information to code with codable children: Secondary | ICD-10-CM

## 2010-04-02 DIAGNOSIS — Z7901 Long term (current) use of anticoagulants: Secondary | ICD-10-CM

## 2010-04-02 DIAGNOSIS — Z5181 Encounter for therapeutic drug level monitoring: Secondary | ICD-10-CM

## 2010-04-02 DIAGNOSIS — M25569 Pain in unspecified knee: Secondary | ICD-10-CM

## 2010-04-02 LAB — CBC
MCHC: 34.2 g/dL (ref 30.0–36.0)
RDW: 15.8 % — ABNORMAL HIGH (ref 11.5–15.5)

## 2010-04-02 LAB — CBC WITH DIFFERENTIAL/PLATELET
Basophils Absolute: 0 10*3/uL (ref 0.0–0.1)
Eosinophils Absolute: 0.1 10*3/uL (ref 0.0–0.7)
Lymphocytes Relative: 12.6 % (ref 12.0–46.0)
MCHC: 34.3 g/dL (ref 30.0–36.0)
Neutro Abs: 4 10*3/uL (ref 1.4–7.7)
Neutrophils Relative %: 66.8 % (ref 43.0–77.0)
RDW: 15.7 % — ABNORMAL HIGH (ref 11.5–14.6)

## 2010-04-02 LAB — BONE MARROW EXAM

## 2010-04-02 LAB — B12 AND FOLATE PANEL
Folate: >24.8 ng/mL (ref >5.9)
Vitamin B-12: >1500 pg/mL — ABNORMAL HIGH (ref 211–911)

## 2010-04-02 LAB — DIFFERENTIAL
Basophils Absolute: 0 10*3/uL (ref 0.0–0.1)
Basophils Relative: 1 % (ref 0–1)
Neutro Abs: 1.9 10*3/uL (ref 1.7–7.7)
Neutrophils Relative %: 47 % (ref 43–77)

## 2010-04-02 LAB — CHROMOSOME ANALYSIS, BONE MARROW

## 2010-04-02 LAB — TISSUE HYBRIDIZATION (BONE MARROW)-NCBH

## 2010-04-03 ENCOUNTER — Encounter: Payer: Self-pay | Admitting: Family Medicine

## 2010-04-04 LAB — URINALYSIS, ROUTINE W REFLEX MICROSCOPIC
Bilirubin Urine: NEGATIVE
Ketones, ur: NEGATIVE mg/dL
Nitrite: NEGATIVE
Protein, ur: NEGATIVE mg/dL
Urobilinogen, UA: 0.2 mg/dL (ref 0.0–1.0)

## 2010-04-04 LAB — COMPREHENSIVE METABOLIC PANEL
ALT: 14 U/L (ref 0–53)
Alkaline Phosphatase: 73 U/L (ref 39–117)
Glucose, Bld: 105 mg/dL — ABNORMAL HIGH (ref 70–99)
Potassium: 4.9 mEq/L (ref 3.5–5.1)
Sodium: 139 mEq/L (ref 135–145)
Total Protein: 5.3 g/dL — ABNORMAL LOW (ref 6.0–8.3)

## 2010-04-04 LAB — CBC
HCT: 38.2 % — ABNORMAL LOW (ref 39.0–52.0)
Hemoglobin: 13 g/dL (ref 13.0–17.0)
RBC: 4.44 MIL/uL (ref 4.22–5.81)
RDW: 14.9 % (ref 11.5–15.5)
WBC: 5.8 10*3/uL (ref 4.0–10.5)

## 2010-04-04 LAB — DIFFERENTIAL
Basophils Relative: 1 % (ref 0–1)
Eosinophils Absolute: 0 10*3/uL (ref 0.0–0.7)
Monocytes Absolute: 0.9 10*3/uL (ref 0.1–1.0)
Monocytes Relative: 15 % — ABNORMAL HIGH (ref 3–12)
Neutrophils Relative %: 66 % (ref 43–77)

## 2010-04-04 LAB — POCT CARDIAC MARKERS: Myoglobin, poc: 121 ng/mL (ref 12–200)

## 2010-04-04 LAB — URINE MICROSCOPIC-ADD ON

## 2010-04-06 ENCOUNTER — Other Ambulatory Visit: Payer: Self-pay | Admitting: Orthopedic Surgery

## 2010-04-07 LAB — URINALYSIS, ROUTINE W REFLEX MICROSCOPIC
Glucose, UA: NEGATIVE mg/dL
Hgb urine dipstick: NEGATIVE
Ketones, ur: NEGATIVE mg/dL
Protein, ur: NEGATIVE mg/dL
pH: 7 (ref 5.0–8.0)

## 2010-04-07 LAB — CBC
Hemoglobin: 14.2 g/dL (ref 13.0–17.0)
MCHC: 33.6 g/dL (ref 30.0–36.0)
MCHC: 34.1 g/dL (ref 30.0–36.0)
MCV: 88.2 fL (ref 78.0–100.0)
Platelets: 111 10*3/uL — ABNORMAL LOW (ref 150–400)
Platelets: 98 10*3/uL — ABNORMAL LOW (ref 150–400)
RBC: 3.34 MIL/uL — ABNORMAL LOW (ref 4.22–5.81)
RBC: 3.58 MIL/uL — ABNORMAL LOW (ref 4.22–5.81)
RBC: 4.75 MIL/uL (ref 4.22–5.81)
RDW: 14.6 % (ref 11.5–15.5)
WBC: 5.5 10*3/uL (ref 4.0–10.5)
WBC: 7.1 10*3/uL (ref 4.0–10.5)
WBC: 3.8 10*3/uL — ABNORMAL LOW (ref 4.0–10.5)

## 2010-04-07 LAB — BASIC METABOLIC PANEL
BUN: 10 mg/dL (ref 6–23)
BUN: 8 mg/dL (ref 6–23)
CO2: 28 mEq/L (ref 19–32)
Calcium: 7.8 mg/dL — ABNORMAL LOW (ref 8.4–10.5)
Calcium: 8 mg/dL — ABNORMAL LOW (ref 8.4–10.5)
Creatinine, Ser: 0.84 mg/dL (ref 0.4–1.5)
GFR calc Af Amer: >60 mL/min (ref >60)
GFR calc non Af Amer: >60 mL/min (ref >60)
Glucose, Bld: 167 mg/dL — ABNORMAL HIGH (ref 70–99)

## 2010-04-07 LAB — TYPE AND SCREEN
ABO/RH(D): B POS
Antibody Screen: NEGATIVE

## 2010-04-07 LAB — PROTIME-INR
INR: 1.81 — ABNORMAL HIGH (ref 0.00–1.49)
Prothrombin Time: 16.9 seconds — ABNORMAL HIGH (ref 11.6–15.2)
Prothrombin Time: 21.7 seconds — ABNORMAL HIGH (ref 11.6–15.2)
Prothrombin Time: 14 seconds (ref 11.6–15.2)

## 2010-04-07 LAB — COMPREHENSIVE METABOLIC PANEL
ALT: 17 U/L (ref 0–53)
AST: 19 U/L (ref 0–37)
Alkaline Phosphatase: 65 U/L (ref 39–117)
CO2: 27 mEq/L (ref 19–32)
Calcium: 8.9 mg/dL (ref 8.4–10.5)
Chloride: 109 mEq/L (ref 96–112)
GFR calc non Af Amer: >60 mL/min (ref >60)
Glucose, Bld: 102 mg/dL — ABNORMAL HIGH (ref 70–99)
Potassium: 4.2 mEq/L (ref 3.5–5.1)
Sodium: 141 mEq/L (ref 135–145)
Total Bilirubin: 0.9 mg/dL (ref 0.3–1.2)

## 2010-04-07 LAB — ABO/RH: ABO/RH(D): B POS

## 2010-04-07 LAB — URINE MICROSCOPIC-ADD ON

## 2010-04-07 NOTE — Progress Notes (Signed)
----   Converted from flag ---- ---- 04/02/2010 11:00 AM, [name] MD wrote: please add Vit D to his labs ------------------------------

## 2010-04-07 NOTE — Assessment & Plan Note (Signed)
Summary: er follow up/alc   Vital Signs:  Patient profile:   77 year old male Height:      50 inches Weight:      155 pounds BMI:     43.75 Temp:     98.3 degrees F oral Pulse rate:   66 / minute Pulse rhythm:   regular BP sitting:   120 / 80  (left arm) Cuff size:   large  Vitals Entered ByMelody Comas (April 02, 2010 10:25 AM) CC: ER follow up    History of Present Illness: 77 yo with h/o pernicious anemia, CAD, HTN, HLD here for ER follow up. Notes reviewed.  10 days post op TKR.  Was given an alernative to coumadin post op (factor Xa inhibitor). Followed up with Dr. Despina Hick earlier this week.  On 3/11, woke up with black stools, immediately went to ER.  No abdominal pain, nausea, vomiting or diarrhea. Had been taking iron supplementation post operatively and was not aware it can darken your stools.  Guaic negative in ER.  Hgb dropped to 8.6, MCV 85.4, plt 105 K. Sent home as pt has a h/o iron deficiency anemia and pancytopenia.  Pt still feeling fatigued today.  No CP or SOB. Due for B12 shot.  Last saw Dr. Cyndie Chime on 02/27/2010.  At that time, Hbg 1.2, plt 82 K Bone marrow biopsy on 10/30/09- hypercelleular marrow but no significan dyspoietic features or increased blasts.      Current Medications (verified): 1)  Zocor 40 Mg Tabs (Simvastatin) .Marland Kitchen.. 1 Once Daily 2)  Doxazosin Mesylate 8 Mg Tabs (Doxazosin Mesylate) .... 1/2 Tab By Mouth Once Daily 3)  Nitrostat 0.4 Mg Subl (Nitroglycerin) .Marland Kitchen.. 1 Tablet Under Tongue At Onset of Chest Pain; You May Repeat Every 5 Minutes For Up To 3 Doses. 4)  Metoprolol Succinate 25 Mg Xr24h-Tab (Metoprolol Succinate) .... Take One Half  Tablet By Mouth Daily  Allergies: 1)  ! Penicillin 2)  ! Septra 3)  ! Sulfa  Past History:  Past Medical History: Last updated: 05/05/2009 C A D (ICD-414.00) COLONIC POLYPS, HX OF (ICD-V12.72) BENIGN PROSTATIC HYPERTROPHY (ICD-600.00) HYPERTENSION (ICD-401.9) HYPERLIPIDEMIA  (ICD-272.4) DIVERTICULOSIS, COLON (ICD-562.10) SLEEP APNEA (ICD-780.57) DEGENERATIVE DISC DISEASE (ICD-722.6) Sleep apnea  ; DDD; Cluster headaches, PMH of Colonic polyps, hx of, Dr Juanda Chance pernicious anemia  Past Surgical History: Last updated: 05/05/2009 Inguinal herniorrhaphy 2003;  Uvulectomy 1993; Arthroscopy X5; Total knee replacement 2003; Rotator cuff repair 2006 Benign prostate biopsy 2008, Dr Marcello Fennel tonsillectomy  Family History: Last updated: December 13, 2007 Father: DECEASED AGE 66 colon cancer Mother: SMOKER-DECEASED AGE 39,  femoral artery graft, bleeding from adhesions Siblings: NONE ; P uncle CA spine  Social History: Last updated: 06/19/2008 Retired Alcohol use-no Regular exercise-yes Tobacco Use - No.   Risk Factors: Exercise: yes (2007-12-13)  Risk Factors: Smoking Status: never (06/19/2008)  Review of Systems      See HPI General:  Complains of fatigue; denies weakness. CV:  Denies chest pain or discomfort. Resp:  Denies shortness of breath. GI:  Denies abdominal pain, change in bowel habits, and nausea.  Physical Exam  General:  Well-developed well-nourished in no acute distress. Skin is warm and dry VSS, afebrile and nontoxic appearing. Abdomen:  soft and nontender. No masses palpated. No bruits. Msk:  Right knee- mild erythema and swelling, incision looks good- still covered with steristrips Neurologic:  grossly intact. Psych:  memory intact for recent and remote, normally interactive, good eye contact, not anxious appearing, and not depressed appearing.  Impression & Recommendations:  Problem # 1:  FATIGUE (ICD-780.79) Assessment Deteriorated likely multifactorial. Hgb has dropped, I hope this is due to his TKR. Recheck CBC, B12/folate today. Given IM B12 in office today as well. Will also check a TSH and Vit D to rule out other sources. Orders: Venipuncture (04540) TLB-TSH (Thyroid Stimulating Hormone) (84443-TSH)  Problem # 2:   ANEMIA, PERNICIOUS (ICD-281.0) Assessment: Unchanged see above.   Not pancytopenic at this point so I hope the additional anemia is due to iron deficiency/blood loss. continue iron supplementation Orders: TLB-CBC Platelet - w/Differential (85025-CBCD) TLB-B12 + Folate Pnl ([id number]/OZH) Vit B12 1000 mcg (J3420) Admin of Therapeutic Inj  intramuscular or subcutaneous (08657)  Complete Medication List: 1)  Zocor 40 Mg Tabs (Simvastatin) .Marland Kitchen.. 1 once daily 2)  Doxazosin Mesylate 8 Mg Tabs (Doxazosin mesylate) .... 1/2 tab by mouth once daily 3)  Nitrostat 0.4 Mg Subl (Nitroglycerin) .Marland Kitchen.. 1 tablet under tongue at onset of chest pain; you may repeat every 5 minutes for up to 3 doses. 4)  Metoprolol Succinate 25 Mg Xr24h-tab (Metoprolol succinate) .... Take one half  tablet by mouth daily   Medication Administration  Injection # 1:    Medication: Vit B12 1000 mcg    Diagnosis: ANEMIA, PERNICIOUS (ICD-281.0)    Route: IM    Site: L deltoid    Exp Date: 10/19/2011    Lot #: [id number]    Mfr: American Regent    Patient tolerated injection without complications    Given by: Linde Gillis CMA Duncan Dull) (April 02, 2010 10:50 AM)  Orders Added: 1)  Venipuncture [84696] 2)  TLB-CBC Platelet - w/Differential [[id number]] 3)  TLB-TSH (Thyroid Stimulating Hormone) [84443-TSH] 4)  TLB-B12 + Folate Pnl [[id number]/FOL] 5)  Vit B12 1000 mcg [J3420] 6)  Admin of Therapeutic Inj  intramuscular or subcutaneous [96372] 7)  Est. Patient Level IV [29528]    Current Allergies (reviewed today): ! PENICILLIN ! SEPTRA ! SULFA   Appended Document: er follow up/alc  Laboratory Results   Blood Tests   Date/Time Recieved: April 02, 2010 11:13 AM  Date/Time Reported: April 02, 2010 11:13 AM   PT: 32.9 s   (Normal Range: 10.6-13.4)  INR: 2.7   (Normal Range: 0.88-1.12   Therap INR: 2.0-3.5)    Continue current dose. Is he scheduled for follow up INR check? [name] MD  April 02, 2010 11:23 AM Not here, he is being followed at home by Dr Lequita Halt. I gave Patient a copy, and if you can have [facility] fax over a copy to the Dr when you sigh it off, please. Thanks Mills Koller  April 02, 2010 11:59 AM  copy

## 2010-04-13 ENCOUNTER — Telehealth: Payer: Self-pay | Admitting: Cardiology

## 2010-04-13 NOTE — Telephone Encounter (Signed)
Spoke with pt, he was seen in [place]. And just received a letter for another appt. The pt does not feel he needs to come in, therefore his follow up will be moved out one year from [place].

## 2010-04-21 LAB — DIFFERENTIAL
Basophils Absolute: 0 10*3/uL (ref 0.0–0.1)
Eosinophils Absolute: 0.1 10*3/uL (ref 0.0–0.7)
Lymphs Abs: 0.6 10*3/uL — ABNORMAL LOW (ref 0.7–4.0)
Monocytes Relative: 10 % (ref 3–12)
Neutro Abs: 0.6 10*3/uL — ABNORMAL LOW (ref 1.7–7.7)

## 2010-04-21 LAB — CBC
HCT: 23 % — ABNORMAL LOW (ref 39.0–52.0)
MCV: 118 fL — ABNORMAL HIGH (ref 78.0–100.0)
Platelets: 83 10*3/uL — ABNORMAL LOW (ref 150–400)
RBC: 1.95 MIL/uL — ABNORMAL LOW (ref 4.22–5.81)
WBC: 1.5 10*3/uL — ABNORMAL LOW (ref 4.0–10.5)

## 2010-04-21 LAB — COMPREHENSIVE METABOLIC PANEL
AST: 18 U/L (ref 0–37)
Albumin: 3.3 g/dL — ABNORMAL LOW (ref 3.5–5.2)
Alkaline Phosphatase: 56 U/L (ref 39–117)
BUN: 24 mg/dL — ABNORMAL HIGH (ref 6–23)
CO2: 29 mEq/L (ref 19–32)
Chloride: 109 mEq/L (ref 96–112)
Creatinine, Ser: 1.01 mg/dL (ref 0.4–1.5)
GFR calc Af Amer: >60 mL/min (ref >60)
GFR calc non Af Amer: >60 mL/min (ref >60)
Potassium: 4 mEq/L (ref 3.5–5.1)
Total Bilirubin: 1 mg/dL (ref 0.3–1.2)

## 2010-04-21 LAB — PROTIME-INR: INR: 1.18 (ref 0.00–1.49)

## 2010-04-21 LAB — APTT: aPTT: 27 seconds (ref 24–37)

## 2010-04-29 ENCOUNTER — Encounter: Payer: Self-pay | Admitting: Family Medicine

## 2010-04-29 ENCOUNTER — Ambulatory Visit (INDEPENDENT_AMBULATORY_CARE_PROVIDER_SITE_OTHER): Payer: Medicare Other | Admitting: Family Medicine

## 2010-04-29 VITALS — BP 140/78 | HR 76 | Temp 98.0°F | Ht 62.5 in | Wt 154.1 lb

## 2010-04-29 DIAGNOSIS — J309 Allergic rhinitis, unspecified: Secondary | ICD-10-CM

## 2010-04-29 DIAGNOSIS — J302 Other seasonal allergic rhinitis: Secondary | ICD-10-CM

## 2010-04-29 MED ORDER — BENZONATATE 100 MG PO CAPS: 100.0000 mg | ORAL_CAPSULE | Freq: Three times a day (TID) | ORAL | Status: DC | PRN

## 2010-04-29 MED ORDER — FEXOFENADINE HCL 180 MG PO TABS: 180.0000 mg | ORAL_TABLET | Freq: Every day | ORAL | Status: DC

## 2010-04-29 MED ORDER — FLUTICASONE PROPIONATE 50 MCG/ACT NA SUSP: 2.0000 | Freq: Every day | NASAL | Status: DC

## 2010-04-29 NOTE — Progress Notes (Signed)
  Subjective:    Patient ID: [name], male    DOB: 1933/09/19, 77 y.o.   MRN: [id number]  HPI CC: ? Allergies  76yo with h/o pernicious anemia and CAD presents with congestion, drainage.  Saturday out in wind all day, saturday evening started with sinus drainage that burned down throat.  Started coughing, dry.  Since then, worse cough so wanted to be checked out.  Cough and draining worse at night.  Starting to be productive sputum (green-yellow).  Mild drainage from nose, clear.  + sneezing and RN and ears feel clogged, popping.  Not as much congestion as when this started.  Tried tessalon and mucinex DM tablets.  Thinks they are helping.  No fevers/chills, abd pain, n/v/d, body aches, myalgia, arthralgia.  No sick contacts around him.  No smokers at home.  No h/o asthma.  + h/o allergies in past, currently not on anything.  Seasonal.  Previously on corsedin.  Review of Systems Per HPI    Objective:   Physical Exam  Vitals reviewed. Constitutional: He appears well-developed and well-nourished. No distress.  HENT:  Head: Normocephalic and atraumatic.  Right Ear: Tympanic membrane, external ear and ear canal normal.  Left Ear: Tympanic membrane, external ear and ear canal normal.  Nose: Mucosal edema present. No rhinorrhea. Right sinus exhibits no maxillary sinus tenderness and no frontal sinus tenderness. Left sinus exhibits no maxillary sinus tenderness and no frontal sinus tenderness.  Mouth/Throat: Oropharynx is clear and moist. No oropharyngeal exudate.       Turbinates pale and boggy, swollen R>L. Absent uvula.  Eyes: Conjunctivae and EOM are normal. Pupils are equal, round, and reactive to light. No scleral icterus.  Neck: Normal range of motion. Neck supple. No thyromegaly present.  Cardiovascular: Normal rate, regular rhythm, normal heart sounds and intact distal pulses.   No murmur heard. Pulmonary/Chest: Effort normal and breath sounds normal. No respiratory  distress. He has no wheezes. He has no rales.  Lymphadenopathy:       Head (right side): No submental, no submandibular, no tonsillar, no preauricular, no posterior auricular and no occipital adenopathy present.       Head (left side): No submental, no submandibular, no tonsillar, no preauricular, no posterior auricular and no occipital adenopathy present.    He has no cervical adenopathy.  Skin: Skin is warm and dry. No rash noted.          Assessment & Plan:

## 2010-04-29 NOTE — Patient Instructions (Signed)
I think this is allergy flare. Continue tessalon and mucinex DM. Start intranasal saline irrigation. Start claritin or zyrtec for drying of runny nose. If not improved with above measures, fill intranasal steroid (prescription provided today). If worsening, or fever >101, or worsening cough, call us to let us know as we'd be concerned with developing infection.

## 2010-04-29 NOTE — Assessment & Plan Note (Signed)
Anticipate flare of seasonal allergies. Treat with intranasal saline, antihistamine, and provided with script for INS. Advised if red flags per instructions to update [place], low threshold to give zpack to cover bronchitis.

## 2010-05-01 NOTE — Discharge Summary (Signed)
NAMEJAHRED, TATAR              ACCOUNT NO.:  [id number]  MEDICAL RECORD NO.:  [id number]           PATIENT TYPE:  I  LOCATION:  1618                         FACILITY:  [facility]  PHYSICIAN:  Ollen Gross, M.D.    DATE OF BIRTH:  November 24, 1933  DATE OF ADMISSION:  03/23/2010 DATE OF DISCHARGE:  03/26/2010                              DISCHARGE SUMMARY   ADMITTING DIAGNOSES: 1. Osteoarthritis, right knee. 2. Tinnitus. 3. Sleep apnea. 4. Hypertension. 5. Coronary arterial disease. 6. Hyperlipidemia. 7. Colonic diverticulosis. 8. Degenerative hip disease. 9. Benign prostatic hypertrophy. 10.History of anemia/pernicious anemia. 11.Eczema.  DISCHARGE DIAGNOSES: 1. Osteoarthritis right knee status post right total knee replacement     arthroplasty. 2. Postoperative acute blood loss anemia. 3. Tinnitus. 4. Sleep apnea. 5. Hypertension. 6. Coronary arterial disease. 7. Hyperlipidemia. 8. Colonic diverticulosis. 9. Degenerative hip disease. 10.Benign prostatic hypertrophy. 11.History of anemia/pernicious anemia. 12.Eczema.  PROCEDURE:  March 23, 2010, right total knee.  Surgeon, Ollen Gross, M.D.  Assistant, Rozell Searing, PAC.  Anesthesia general.  Tourniquet time 39 minutes.  CONSULTS:  None.  BRIEF HISTORY:  [name] is a 77 year old male with end-stage arthritis of the right knee, progressively worsening pain and dysfunction, failed nonoperative management, now presents for surgical intervention.  LABORATORY DATA:  CBC on admission:  Hemoglobin of 13.9, hematocrit of 42.6, white cell count 4.4, platelets 89.  Postop hemoglobin 9.9 and then 8.5.  Last noted hemoglobin and hematocrit 7.9 and 23.7.  PT/INR postop 16.0 and 1.26.  Last noted prothrombin time and INR per Coumadin protocol 22.3 and 1.94.  I do not have the admission Chem panel because it was not scanned into the chart, but the following BMETs for 48 hours on postop day 1 and 2 are within normal limits  except for a serum glucose which was 170 on both days.  Blood group type B positive.  HOSPITAL COURSE:  The patient admitted to [facility], taken to OR, underwent the above-stated procedure without complication.  The patient tolerated the procedure well, later transferred to the recovery room, orthopedic floor, followed on p.o. and IV analgesics.  Hemovac drain placed at the time of surgery was pulled without difficulty. Actually doing pretty well on the morning of day 1, allowed to be weightbearing as tolerated.  Hemoglobin was 9.9, had excellent urinary output, started back on all of his home meds.  By day 2, the patient was doing fairly well.  Pain was under good control.  Hemoglobin was down to 8.5, thus we started him on some iron supplementation.  Despite having low hemoglobin, the patient started to progress with therapy.  By day 3, hemoglobin was 7.9, but asymptomatic.  Dressing was changed on day 2 and day 3.  Incision was looking good.  The patient was stable, slowly improving.  It was felt the patient could be discharged home at that time.  DISCHARGE PLAN: 1. The patient was discharged home on March 26, 2010. 2. Discharge diagnoses, please see above. 3. Discharge meds; Percocet, Robaxin, Coumadin, Nu-Iron.  Continue     aspirin, doxazosin, nitroglycerin, simvastatin, and Toprol-XL.  DIET:  Heart-healthy diet.  ACTIVITY:  Weightbearing as tolerated, total hip protocol.  Home health PT, home health nursing.  FOLLOWUP:  2 weeks.  DISPOSITION:  Home.  CONDITION UPON DISCHARGE:  Improved.     [name], P.A.C.   ______________________________ Ollen Gross, M.D.    ALP/MEDQ  D:  04/23/2010  T:  04/23/2010  Job:  [id number]  cc:   Madolyn Frieze. Jens Som, MD, [facility] [address]  [address] [place] [address]  Lynelle Smoke I. Patsi Sears, M.D. Fax: [phone]  [name], M.D.  Electronically Signed by Patrica Duel P.A.C. on 04/27/2010  07:49:51 AM Electronically Signed by Ollen Gross M.D. on 05/01/2010 09:01:07 AM

## 2010-05-01 NOTE — H&P (Signed)
[name], [name]              ACCOUNT NO.:  [id number]  MEDICAL RECORD NO.:  [id number]           PATIENT TYPE:  I  LOCATION:  0006                         [facility]ACILITY:  [facility]  PHYSICIAN:  Ollen Gross, M.D.    DATE OF BIRTH:  Mar 14, 1933  DATE OF ADMISSION:  03/23/2010 DATE OF DISCHARGE:                             HISTORY & PHYSICAL   CHIEF COMPLAINT:  Right knee pain.  HISTORY OF PRESENT ILLNESS:  The patient 76-year male who has been seen by Dr. Lequita Halt for ongoing right knee pain, has been treated in the past for his arthritis.  He has undergone a left knee replaced by Dr. Eulah Pont about 5 years ago, done well with that.  He has had a point now where he would like to have the right knee done.  Has been seen by Dr. Lequita Halt, felt to be a good candidate.  Risks and benefits have been discussed. He has been seen by [employer] and felt to be stable for up and coming procedure.  ALLERGIES: 1. CIPRO.2. PENICILLIN. 3. SULFA.  CURRENT MEDICATIONS: 1. Nitrostat. 2. Baby aspirin. 3. Doxazosin. 4. Fish oil. 5. Toprol. 6. Simvastatin. 7. Monthly B12 injections.  PAST MEDICAL HISTORY: 1. Tinnitus. 2. Sleep apnea. 3. Hypertension. 4. Coronary arterial disease. 5. Hyperlipidemia. 6. Colonic diverticulosis. 7. Degenerative disk disease. 8. Benign process hypertrophy. 9. History of anemia, pernicious anemia. 10.Eczema.  PAST SURGICAL HISTORY: 1. Hernia repair. 2. Cardiac catheterization. 3. Uvulectomy. 4. Left total knee replacement. 5. Rotate cuff repair. 6. Prostate biopsy. 7. Right total hip.  FAMILY HISTORY:  Father with cancer.  Mother with vascular femoral bypass.  SOCIAL HISTORY:  Married, retired, past smoker.  No alcohol.  Wife will be assisting with care after surgery.  Does plan to go home after his hospital stay.  Does have a living will.  REVIEW OF SYSTEMS:  GENERAL:  No fevers, chills, or night sweats. NEURO:  He does have a little bit of  tinnitus and ringing.  No seizures, syncope, or paralysis.  RESPIRATORY:  No shortness breath, productive cough, or hemoptysis.  CARDIOVASCULAR:  No chest pain or orthopnea.  GI: No nausea, vomiting, diarrhea, or constipation.  GU:  Little bit of frequency, nocturia.  No dysuria or hematuria.  MUSCULOSKELETAL:  Knee pain.  PHYSICAL EXAMINATION:  VITAL SIGNS:  Pulse 60, respirations 12, blood pressure 134/66. GENERAL:  A 76-year white male, well nourished, well developed, short statured, no acute stress.  He is alert, awake, and cooperative, good historian. HEENT:  Normocephalic, atraumatic.  Pupils are round and reactive.  EOMs intact. NECK:  Supple. CHEST:  Clear. HEART:  Regular rate and rhythm with a faint systolic ejection murmur best heard over pulmonic point. ABDOMEN:  Soft, slightly round.  Bowel sounds present. RECTAL, BREASTS, AND GENITALIA:  Not done, not part of present illness. EXTREMITIES:  Right knee, flexion 120, internal rotation 30, external rotation 40, and abduction 40.  IMPRESSION:  Osteoarthritis, right knee.  PLAN:  The patient will be admitted to [facility] to undergo a right total knee replacement arthroplasty.  Surgery will be performed by Ollen Gross.  Alexzandrew L. Julien Girt, P.A.C.   ______________________________ Ollen Gross, M.D.    ALP/MEDQ  D:  03/23/2010  T:  03/23/2010  Job:  [id number]  cc:   Madolyn Frieze. Jens Som, MD, [facility] [address]  [address] [place] [address]  Lynelle Smoke I. Patsi Sears, M.D. Fax: [phone]  [name], M.D.  Electronically Signed by Patrica Duel P.A.C. on 04/30/2010 11:11:05 AM Electronically Signed by Ollen Gross M.D. on 05/01/2010 09:01:09 AM

## 2010-05-04 ENCOUNTER — Ambulatory Visit (INDEPENDENT_AMBULATORY_CARE_PROVIDER_SITE_OTHER): Payer: Medicare Other | Admitting: Family Medicine

## 2010-05-04 DIAGNOSIS — J329 Chronic sinusitis, unspecified: Secondary | ICD-10-CM | POA: Insufficient documentation

## 2010-05-04 LAB — POCT I-STAT 4, (NA,K, GLUC, HGB,HCT)
HCT: 40 % (ref 39.0–52.0)
Sodium: 141 mEq/L (ref 135–145)

## 2010-05-04 MED ORDER — AZITHROMYCIN 250 MG PO TABS: ORAL_TABLET | ORAL | Status: AC

## 2010-05-04 NOTE — Assessment & Plan Note (Signed)
Initially thought to be seasonal allergy flare (in setting of changing seasons). Now concern for developing sinus infection.  Going on 9+ days now, cover with zpack (allergy to PCN, sulfa). Update [place] if worsening or red flags. Stop antihistamine (drying) and continue flonase, nasal saline, mucinex and pushing fluids.

## 2010-05-04 NOTE — Progress Notes (Signed)
  Subjective:    Patient ID: [name], male    DOB: 1933/01/22, 77 y.o.   MRN: [id number]  HPI CC: not better  Seen 04/29/2010.  Thought had flare of allergic rhinitis.  Treated with flonase, mucinex, nasal saline and allegra.  Still sneezing, congested.  Using intranasal steroid for 5 days now twice daily.  Cough still productive of yellow sputum.  + significant PNdrainage.  Tessalon perls helping cough.  Mucinex DM with plenty of fluid.  Doesn't think allegra helped much.  Last night vomited.  Not post tussive, more associated with nausea.  NBNB.  No chest pain/tightness, SOB, HA.  + dizziness this morning.  No sick contacts at home.  Review of Systems Per HPI    Objective:   Physical Exam  [vitalsreviewed. Constitutional: He appears well-developed and well-nourished. No distress.  HENT:  Head: Normocephalic and atraumatic.  Right Ear: Hearing, external ear and ear canal normal.  Left Ear: Hearing, external ear and ear canal normal.  Nose: Mucosal edema present. No rhinorrhea. Right sinus exhibits no maxillary sinus tenderness and no frontal sinus tenderness. Left sinus exhibits no maxillary sinus tenderness and no frontal sinus tenderness.  Mouth/Throat: Oropharynx is clear and moist and mucous membranes are normal. No oropharyngeal exudate.       TMs congested bilaterally + mucosal erythema/irritation bilateral nasal epithelium  Eyes: Conjunctivae and EOM are normal. Pupils are equal, round, and reactive to light. No scleral icterus.  Neck: Normal range of motion. Neck supple. No thyromegaly present.  Cardiovascular: Normal rate, regular rhythm, normal heart sounds and intact distal pulses.   No murmur heard. Pulmonary/Chest: Effort normal and breath sounds normal. No respiratory distress. He has no wheezes. He has no rales.  Lymphadenopathy:    He has no cervical adenopathy.  Skin: Skin is warm and dry. No rash noted.          Assessment & Plan:

## 2010-05-04 NOTE — Patient Instructions (Signed)
Add on a zpack - take x 5 days. Continue flonase for now.  Continue mucinex with plenty of fluid. Use nasal saline daily. Hold allegra for now. Push fluids and plenty of rest. If any fevers >101 or worsening breathing, you may need to return to see [place].

## 2010-05-05 ENCOUNTER — Ambulatory Visit: Payer: Medicare Other

## 2010-05-06 ENCOUNTER — Other Ambulatory Visit: Payer: Self-pay | Admitting: *Deleted

## 2010-05-06 DIAGNOSIS — E559 Vitamin D deficiency, unspecified: Secondary | ICD-10-CM

## 2010-05-07 ENCOUNTER — Ambulatory Visit (INDEPENDENT_AMBULATORY_CARE_PROVIDER_SITE_OTHER): Payer: Medicare Other | Admitting: Family Medicine

## 2010-05-07 DIAGNOSIS — D510 Vitamin B12 deficiency anemia due to intrinsic factor deficiency: Secondary | ICD-10-CM

## 2010-05-07 MED ORDER — CYANOCOBALAMIN 1000 MCG/ML IJ SOLN
1000.0000 ug | Freq: Once | INTRAMUSCULAR | Status: AC
  Administered 2010-05-07: 1000 ug via INTRAMUSCULAR

## 2010-05-07 NOTE — Progress Notes (Signed)
  Subjective:    Patient ID: [name], male    DOB: 1933/01/31, 77 y.o.   MRN: [id number]  HPI  B12 injection given.  Review of Systems     Objective:   Physical Exam        Assessment & Plan:

## 2010-05-11 ENCOUNTER — Other Ambulatory Visit: Payer: Self-pay | Admitting: *Deleted

## 2010-05-11 MED ORDER — BENZONATATE 100 MG PO CAPS: 100.0000 mg | ORAL_CAPSULE | Freq: Three times a day (TID) | ORAL | Status: DC | PRN

## 2010-05-12 ENCOUNTER — Other Ambulatory Visit: Payer: Self-pay | Admitting: *Deleted

## 2010-05-12 MED ORDER — ERGOCALCIFEROL 1.25 MG (50000 UT) PO CAPS: 50000.0000 [IU] | ORAL_CAPSULE | ORAL | Status: DC

## 2010-05-14 ENCOUNTER — Other Ambulatory Visit (INDEPENDENT_AMBULATORY_CARE_PROVIDER_SITE_OTHER): Payer: Medicare Other | Admitting: Family Medicine

## 2010-05-14 DIAGNOSIS — E559 Vitamin D deficiency, unspecified: Secondary | ICD-10-CM

## 2010-06-02 NOTE — Assessment & Plan Note (Signed)
[facility]                            CARDIOLOGY OFFICE NOTE   VILAS, EDGERLY                     MRN:          [id number]  DATE:09/09/2006                            DOB:          [birth year]    [name] is a pleasant gentleman who has a history of coronary  disease.  His most recent cardiac catheterization was performed in 2000.  At that time he was found to have a 60% - 70% proximal LAD, 70% - 80%  stenosis in the small intermediate branch, and a 20% stenosis in the mid  right coronary artery.  His LV function was normal.  Since I last saw  him he occasionally feels a twinge of chest pain by his report, however  he has not taken nitroglycerin.  He does not have exertional chest pain.  He denies any dyspnea.  He occasionally feels mild dizziness after  taking his medications in the evening but is otherwise doing well with  no syncope.   MEDICATIONS:  1. Altace 10 mg p.o. daily.  2. Amlodipine 5 mg p.o. daily.  3. Zocor 40 mg p.o. daily.  4. Flomax 0.4 mg p.o. daily.  5. Aspirin 81 mg p.o. daily.  6. Omega 3.  7. Multivitamin.   PHYSICAL EXAMINATION:  Shows a blood pressure of 130/70 and his pulse is  82, he weighs 149 pounds.  HEENT:  Normal.  NECK:  Supple.  CHEST:  Clear.  CARDIOVASCULAR:  Reveals a regular rate and rhythm.  ABDOMINAL:  Benign.  EXTREMITIES:  Show no edema.   Electrocardiogram shows a sinus rhythm at a rate of 78.  There are  occasional PACs.  There is left axis deviation.  There are no ST changes  noted.   DIAGNOSES:  1. Brief atypical chest pain -- His symptoms are atypical.  However,      it has been 3 years since his previous Myoview and we will plan to      proceed with that for risk stratification.  If it shows no ischemia      we will continue with medical therapy.  2. History of coronary disease -- He will continue on his aspirin,      angiotensin converting enzyme inhibitor and statin.  3.  Hypertension -- His blood pressure is well-controlled on his      present medications.  4. Hyperlipidemia -- His recent lipids and liver in April were      unremarkable with normal liver function tests, and HDL of 51 and an      LDL of 61.   He will continue with risk factor modification and we will see him back  in 9 months.     [name] Brandon Som, MD, [facility]  Electronically Signed    BSC/MedQ  DD: 09/09/2006  DT: 09/10/2006  Job #: [id number]

## 2010-06-02 NOTE — Assessment & Plan Note (Signed)
[facility]                            CARDIOLOGY OFFICE NOTE   [name]                     MRN:          [id number]  DATE:07/05/2007                            DOB:          04-Mar-1933    [name] is a pleasant gentleman who has a history of coronary artery  disease by cardiac catheterization in 2000.  His most recent Myoview was  performed on September 16, 2006.  At that time, his ejection fraction was  78% and there was no ischemia or infarction.  Since I last saw him, he  is doing reasonably well with no chest pain or shortness of breath.  He  is complaining of dizziness predominantly with standing.  He has noticed  this over the past 6 months or so.  Noticed it is not associated with  chest pain, shortness of breath, or palpitations.  He has not had  syncope.  It resolves after standing for several seconds.   CURRENT MEDICATIONS:  1. Altace 10 mg p.o. daily.  2. Multivitamin.  3. Omega 3.  4. Aspirin 81 mg p.o. daily.  5. Flomax.  6. Zocor 40 mg p.o. daily.  7. Amlodipine 5 mg p.o. daily.   PHYSICAL EXAMINATION:  VITAL SIGNS:  Today shows a blood pressure of  127/78 and his pulse is 63.  He weighs 149 pounds.  HEENT:  Normal.  NECK:  Supple with no bruits.  CHEST:  Clear.  CARDIOVASCULAR:  Regular rate and rhythm.  ABDOMEN:  No tenderness.  EXTREMITIES:  No edema.   Electrocardiogram shows a sinus rhythm at the rate of 63.  There were no  ST changes noted.   DIAGNOSES:  1. Presyncope/dizziness - This appears to be orthostatic in nature.      It does not appear to be significantly bothersome.  I have told him      that if it worsens then he can discontinue his amlodipine to      increase his blood pressure, which should help his symptoms with      standing.  Otherwise, he will continue with the same.  2. Coronary artery disease - He has not had chest pain or shortness of      breath, and his Myoview in the fall was  normal.  We will continue      with medical therapy to include his aspirin, ACE inhibitor, and      statin.  3. Hypertension - His blood pressure is adequately controlled on his      present medications.  I will check a BMET, and follow his potassium      and renal function.  4. Hyperlipidemia - He will continue on the statin and we will check      lipids and liver, and adjust with goal LDL of less than 70.   He will return to see [place] in 12 months.  In the meantime, he will  continue with risk factor modification including diet and exercise.  He  does not smoke.     [name] Brandon Som, MD, [facility]  Electronically Signed  BSC/MedQ  DD: 07/05/2007  DT: 07/06/2007  Job #: [id number]

## 2010-06-02 NOTE — Op Note (Signed)
NAMESIDNEY, KANN              ACCOUNT NO.:  [id number]   MEDICAL RECORD NO.:  [id number]          PATIENT TYPE:  AMB   LOCATION:  NESC                         FACILITY:  [facility]   PHYSICIAN:  Sigmund I. Patsi Sears, M.D.DATE OF BIRTH:  [birth year]   DATE OF PROCEDURE:  05/29/2007  DATE OF DISCHARGE:                               OPERATIVE REPORT   PREOPERATIVE DIAGNOSES:  1. Prostate nodule.  2. History of benign prostatic hypertrophy.  3. Peyronie disease.  4. Sleep apnea.  5. Atrial fibrillation.   POSTOPERATIVE DIAGNOSES:  1. Prostate nodule.  2. History of benign prostatic hypertrophy.  3. Peyronie disease.  4. Sleep apnea.  5. Atrial fibrillation.   OPERATIONS:  Transrectal needle biopsy of prostate.   SURGEON:  Dr. Patsi Sears   ANESTHESIA:  General LMA.Marland Kitchen   PREPARATION:  After appropriate preanesthesia, the patient is brought to  the operating room, placed on the operating room in the dorsal supine  position where general LMA anesthesia was induced.  He was then replaced  in dorsal lithotomy position where the pubis was prepped with Betadine  solution and draped in usual fashion.   REVIEW OF HISTORY:  This 77 year old male has a history of midline  prostate nodule with a history of BPH.  He has a PSA of 3.16.  Note that  the PSA has risen 0.6 sent July 2008.  The patient is now for prostate  ultrasound and biopsy.   PROCEDURE:  The prostate  was ultrasounded in the horizontal and  longitudinal of positions.  The prostate volume is pending.   Twelve cores of tissue were taken from the right and left portions of  the prostate, and sent to laboratory independently.  The patient  tolerated procedure well.  He was awakened after given IV Toradol, and  was taken to the recovery room in good condition.  He underwent p.o.  Cipro, as well as IV gentamicin.      Sigmund I. Patsi Sears, M.D.  Electronically Signed     SIT/MEDQ  D:  05/29/2007  T:  05/29/2007  Job:   [id number]   cc:   Alwyn Ren, M.D.

## 2010-06-02 NOTE — Op Note (Signed)
NAMEJOWEL, WALTNER              ACCOUNT NO.:  [id number]   MEDICAL RECORD NO.:  [id number]          PATIENT TYPE:  AMB   LOCATION:  NESC                         [facility]ACILITY:  [facility]   PHYSICIAN:  Sigmund I. Tannenbaum, M.D.DATE OF BIRTH:  06/13/1933   DATE OF PROCEDURE:  01/26/2008  DATE OF DISCHARGE:                               OPERATIVE REPORT   PREOPERATIVE DIAGNOSIS:  Elevated prostate-specific antigen (4.25) with  abnormal free fraction prostate-specific antigen (17.25) and increase in  prostate-specific antigen over a 1-year period (3.16 to 4.25).   POSTOPERATIVE DIAGNOSIS:  Elevated prostate-specific antigen (4.25) with  abnormal free fraction prostate-specific antigen (17.25) and increase in  prostate-specific antigen over a 1-year period (3.16 to 4.25).   OPERATIONS:  Transrectal needle biopsy of the prostate.   SURGEON:  Sigmund I. Patsi Sears, M.D.   ANESTHESIA:  General LMA.   DESCRIPTION OF PROCEDURE:  After appropriate preanesthesia, the patient  was brought to the operating room, placed on the operating table in the  dorsal supine position where general LMA anesthesia was introduced.  He  was then re-placed in the left lateral decubitus position where the  patient was prepped and draped in the usual fashion.  Transrectal  ultrasonography was accomplished, which showed no evidence of rectal  mass.  In addition, rectal examination showed a 3+ lobular benign  prostate with no abnormalities.  Prostate volume was 39.09 mL.   Twelve cores of tissue were taken, and were marked and sent to the  laboratory for examination.  The patient tolerated the procedure well,  was awakened and taken to the recovery room in good condition.   Note that he was given p.o. Cipro preoperatively and 1 gram of IV  Rocephin as antibiotic coverage.      Sigmund I. Patsi Sears, M.D.  Electronically Signed     SIT/MEDQ  D:  01/26/2008  T:  01/26/2008  Job:  [id number]

## 2010-06-04 ENCOUNTER — Ambulatory Visit (INDEPENDENT_AMBULATORY_CARE_PROVIDER_SITE_OTHER): Payer: Medicare Other | Admitting: Family Medicine

## 2010-06-04 DIAGNOSIS — E538 Deficiency of other specified B group vitamins: Secondary | ICD-10-CM

## 2010-06-04 MED ORDER — CYANOCOBALAMIN 1000 MCG/ML IJ SOLN
1000.0000 ug | Freq: Once | INTRAMUSCULAR | Status: AC
  Administered 2010-06-04: 1000 ug via INTRAMUSCULAR

## 2010-06-04 NOTE — Progress Notes (Signed)
Here for B12 injection. No complaints.

## 2010-06-05 NOTE — Assessment & Plan Note (Signed)
[name]                        [facility] OFFICE NOTE   NAME:[name], Brandon A                     MRN:          [id number]  DATE:05/17/2006                            DOB:          [birth year]    [name] is seen May 17, 2006 for comprehensive physical exam  as recommended by [facility].   His major issues at this time are musculoskeletal with mainly joint pain  but also myalgias.  He has degenerative disk disease with low back  syndrome.  He had been prescribed Celebrex by Dr. Trey Sailors, but  subsequently was changed to nabumetone 500 mg twice a day.  He also  receives physical therapy.  He had shoulder surgery in November 2006 by  Dr. Eulah Pont, and has had steroid injection with this as well.  He has  also been taking Darvocet as needed.   PAST MEDICAL HISTORY:  Includes small vessel disease with  catheterization in 2000.  He had knee surgery in the early 1980s,  presumably an arthroscopy.  In 1993, he had a uvulectomy for sleep  apnea.  He also had tonsillectomy and adenoidectomy.  Colonoscopy was  performed in 1993, and was repeated in 2003.  These were negative.  He  has had a double herniorrhaphy in 2003.  He had total knee replacement  in 2003.   Medical problems include:  1. Hypertension.  2. Dyslipidemia.  3. Prostatic hypertrophy.  4. Prostatism.  5. Cluster headaches.  6. Diverticulosis evident at colonoscopy.   There is stroke in the paternal grandfather and maternal grandmother.  Father died of colon cancer at age 74.  Paternal uncle had cancer of the  spine.  His mother had a femoral artery graft, and had GI bleeding later  with adhesions.   He has not smoked since his teens.  He does not drink.  He is on no  special diet.  He walks approximately a mile 1 time a week with no  cardiopulmonary symptoms.   REVIEW OF SYSTEMS:  Otherwise, essentially negative.  He does have some  positional paresthesias related  to degenerative joint disease.   HE HAS BEEN INTOLERANT OR ALLERGIC TO:  1. PENICILLIN.  2. SULFA.  3. GLUCOSAMINE.  Glucosamine caused joint pain, surprisingly.   He is on:  1. Amlodipine 5 mg daily.  2. Aspirin 81 mg.  3. Flomax 0.4 mg from Dr. Patsi Sears.  4. Propecia daily.  5. Ramipril 10 mg daily.  6. Simvastatin 40 mg daily.  7. Supplements.   He was previously on CPAP, but not at present.   His height is 5 feet 1-1/4 recorded today.  Weight is down 4 pounds  to  150.  Pulse was 74.  Respiratory rate 12.  Blood pressure 118/70.  He appears much younger than his stated age.  LUNGS:  Clear.  He does have arteriolar narrowing.  He has evidence of a previous uvulectomy.  There is a small osteoma of  the hard palate.  Thyroid is substernal.  He has no lymphadenopathy of head/neck.  The remainder of the otolaryngologic exam was unremarkable.  He has  a grade 1 half systolic murmur.  All pulses are intact.  There is  no edema.  CHEST:  Clear with no increased work of breathing.  He has no organomegaly or masses.  He has significant degenerative joint disease, mainly in his hands.  He  also has significant crepitus in the knees with no effusion.  There is a  well-healed operative scar at the right shoulder.  GENITOURINARY AND RECTAL EXAMS:  Deferred, as these are completed by Dr.  Patsi Sears, who follows his PSA.   Complete labs will be performed.  Because of his arthralgias and  myalgias, rheumatoid arthritis titers, uric acid, and CPK will be drawn  as well.   He has had steroid injections, and this raises the risk of osteopenia.  He is due for a bone density.  He had mild osteopenia in 2005.  A  vitamin D level will also be checked.   The Black Box Warning concerning nabumetone therapy was discussed.  This  would negate the cardiac benefit of the baby aspirin.  It is recommended  that he consider taking Arthritis Tylenol at bedtime, and coated baby  aspirin 1 to 3 each  morning, as this would retain the cardiac  protection, and not have the cardiac or GI risk.   His EKG revealed low voltage, but was otherwise normal.  Further  recommendations will be directed by these lab results.     Titus Dubin. Alwyn Ren, MD,FACP,FCCP  Electronically Signed    WFH/MedQ  DD: 05/17/2006  DT: 05/17/2006  Job #: [id number]

## 2010-06-05 NOTE — Assessment & Plan Note (Signed)
[facility]                              CARDIOLOGY OFFICE NOTE   SEANPAUL, PREECE                     MRN:          [id number]  DATE:11/22/2005                            DOB:          11/25/1933    [name] is a very pleasant 77 year old gentleman with a history of  coronary disease.  Since I last saw him he denies any dyspnea, chest pain,  palpitations, or syncope.  There is no pedal edema or claudication.  He does  have some difficulties with disc problems in his back.   His medications include:  1. Altace 10 mg p.o. daily.  2. Amlodipine 5 mg p.o. daily.  3. Zocor 40 mg p.o. q.h.s.  4. Celebrex 200 mg p.o. daily.  5. Flomax 0.4 mg p.o. daily.  6. Aspirin 81 mg p.o. daily.  7. Omega 3 fish oil.  8. Multivitamin.   PHYSICAL EXAMINATION TODAY:  VITAL SIGNS:  Shows a blood pressure of 130/78  and he weighs 151 pounds.  His pulse is 72.  NECK:  Supple and there are no bruits.  CHEST:  Clear.  CARDIOVASCULAR:  Regular rate and rhythm.  EXTREMITIES:  Show no edema.   Electrocardiogram today shows a sinus rhythm at a rate of 72.  There are no  significant ST changes noted.   DIAGNOSES:  1. Coronary artery disease.  2. Hypertension.  3. Hyperlipidemia.   PLAN:  [name] is doing well from a symptomatic standpoint.  He was  previously having myalgias on Zocor and changed to Vytorin.  However, he has  now resumed his Zocor at 40 q.h.s. for financial reasons.  We will check a  CMET today as well as lipids.  Our goal LDL should be less than 70, given  his history of coronary disease.  He is following a diet but not exercising  routinely and we discussed the importance of that.  He does not smoke.  I  will see him back in 9 months.    ______________________________  Madolyn Frieze. Jens Som, MD, [facility]    BSC/MedQ  DD: 11/22/2005  DT: 11/22/2005  Job #: [id number]

## 2010-06-05 NOTE — Op Note (Signed)
[name], [name]                        ACCOUNT NO.:  [id number]   MEDICAL RECORD NO.:  [id number]                   PATIENT TYPE:  INP   LOCATION:  2899                                 FACILITY:  [facility]   PHYSICIAN:  Loreta Ave, M.D.              DATE OF BIRTH:  02/22/1933   DATE OF PROCEDURE:  05/02/2003  DATE OF DISCHARGE:                                 OPERATIVE REPORT   PREOPERATIVE DIAGNOSIS:  End stage degenerative arthritis left knee with  varus alignment and bony erosion, medial compartment.   POSTOPERATIVE DIAGNOSIS:  End stage degenerative arthritis left knee with  varus alignment and bony erosion, medial compartment.   OPERATIVE PROCEDURE:  Left total knee replacement with Osteonix prosthesis.  Soft tissue balancing.  Cemented number 9 posterior stabilized  femoral  component.  Cemented number 9 tibial component with 10 mm flex posterior  stabilized polyethylene insert.  Cemented recessed 9 mm by 28 mm patellar  component.   SURGEON:  Loreta Ave, M.D.   ASSISTANT:  Arlys John D. Petrarca, P.A.-C.   ANESTHESIA:  General.   ESTIMATED BLOOD LOSS:  Minimal.   TOURNIQUET TIME:  1 hour 20 minutes.   SPECIMENS:  Bone and soft tissues.   CULTURES:  None.   COMPLICATIONS:  None.   DRAINS:  Hemovac x 2.   PROCEDURE:  The patient was brought to the operating room and placed on the  operating table in supine position.  After adequate anesthesia had been  obtained, a tourniquet was applied to the upper aspect of the left leg.  Prepped and draped in the usual sterile fashion.  Exsanguinated with  elevation of Esmarch and tourniquet inflated to 350 mmHg.  5 degrees of  varus correctable to neutral mild flexion contraction with further flexion  to 130 degrees.  Laxity lateral ligaments, otherwise, ligaments intact.  A  straight incision above the patella down to the tibial tubercle.  Medial  parapatellar arthrotomy.  Knee exposed.  Grade 4 changes  throughout.  Reactive effusion cleared.  Remnants of the menisci, cruciate ligaments,  extensive fat pad, periarticular spurs, loose bodies all removed.  Distal  femur entered with intramedullary guide.  Distal cut removing 10 mm set at 5  degrees of valgus.  Sized for number  9 component with appropriate rotation.  Jigs put in place and definitive cuts made.  Tibial spine removed with a  saw.  Intramedullary guide placed.  Proximal tibial cut and 5 degree  posterior slope cut with the intramedullary guide resecting down to the  level of the posteromedial defect.  The area of sclerotic bone  posteromedially was treated with multiple drilling.  Patella sized, reamed,  and drilled for 28 mm component.  The recess examined and all spurs removed.  The trials were put in place.  A number 9 on the femur, number 9 on the  tibia and 28 on the patella.  With  the 10 mm insert, full extension, full  flexion, good stability, good alignment, no lift off in flexion.  The tibia  was marked for rotation and reamed.  Cement prepared.  Copious irrigation  with the pulse irrigating device.  Cement placed on all components which  were firmly seated.  Polyethylene attached to the tibia.  The knee reduced.  Cement hardened.  Knee re-examined.  Full extension, full flexion,  acceptable patellofemoral tracking, good stability.  The wound was  irrigated.  A  Hemovac was placed and brought out through a separate stab  wound.  Arthrotomy closed with #1 Vicryl, skin and subcutaneous tissue with  Vicryl and staples.  Margins of the wound and knee injected with Marcaine  and a Hemovac clamp.  Sterile compressive dressing applied.  Anesthesia  reversed.  Brought to the recovery room.  Tolerated the surgery well without  complications.                                               Loreta Ave, M.D.    DFM/MEDQ  D:  05/02/2003  T:  05/02/2003  Job:  [id number]

## 2010-06-05 NOTE — Op Note (Signed)
NAMESOMNANG, [name]              ACCOUNT NO.:  [id number]   MEDICAL RECORD NO.:  [id number]          PATIENT TYPE:  AMB   LOCATION:  DSC                          FACILITY:  [facility]   PHYSICIAN:  Loreta Ave, M.D. DATE OF BIRTH:  May 30, 1933   DATE OF PROCEDURE:  DATE OF DISCHARGE:                                 OPERATIVE REPORT   PREOPERATIVE DIAGNOSES:  1.  Right shoulder chronic impingement.  2.  Rotator cuff tear.  3.  Distal clavicle osteolysis.   POSTOPERATIVE DIAGNOSES:  1.  Right shoulder chronic impingement.  2.  Rotator cuff tear.  3.  Distal clavicle osteolysis.  4.  Anterior labrum tear.  5.  Partial tearing of long head biceps tendon.  6.  Also interval tearing between supraspinatus and subscapular tendon.   OPERATION/PROCEDURE:  1.  Right shoulder examination under anesthesia.  2.  Arthroscopy.  3.  Debridement of labrum, biceps and cuff.  4.  Acromioplasty with CA (coracoacromion) ligament release.  5.  Excision distal clavicle.  6.  Mini open repair rotator cuff tear with FiberWire suture and Concept      repair system.  7.  Repair interval tear with FiberWire as well.   SURGEON:  Loreta Ave, M.D.   ASSISTANT:  Genene Churn. Barry Dienes, P.A.-C.   ANESTHESIA:  General.   ESTIMATED BLOOD LOSS:  Minimal.   SPECIMENS:  None.   CULTURES:  None.   COMPLICATIONS:  None.   DRESSING:  Soft compressive with shoulder immobilizer.   DESCRIPTION OF PROCEDURE:  The patient was brought to the operating room,  placed on the operating table in the supine position.  After adequate  anesthesia had been obtained, right shoulder examined.  Full motion  disability.  Placed in the beach chair position on a shoulder positioner,  prepped and draped in the usual sterile fashion.  Three portals, anterior,  posterior and lateral.  Shoulder entered with blunt obturator.  Arthroscope  introduced.  Shoulder distended and inspected.  Additional tearing anterior  labrum  debrided.  Partial tearing long head biceps tendon over near the  hiatus debrided.  Not dislocated.  The vast majority of the tendon still  intact.  Anchor a little hypermobile but intact.  Articular cartilage and  shoulder looked good.  Complete avulsion supraspinatus tendon from biceps in  the front to the infraspinatus in the back.  Retracted but still mobile. A  little interval tearing front edge.  Cuff debrided. From above,  subacromially, type 3 acromion chronic impingement.  Acromioplasty to a type  1 acromion, release the CA ligament.  Distal clavicle grade 4 changes with  spurring.  Lateral centimeter spur resected.  Adequate CA decompression  confirmed small portals.  Instruments and fluid removed.  Lateral incision  through the lateral portals splitting deltoid.  Retracting splint placed.  Subacromial space inspected.  Adequacy of decompression confirmed digitally.  Cuff mobilized.  Captured with weaved FiberWire suture and a front suture  used to close the interval tear.  Brought over to the tuberosity.  Series of  drill holes made in the roughened tuberosity and sutures  brought from there  out to the lateral cortex with  the Concept repair system.  These were then  firmly tied over a bony bridge.  At completion, nice firm water-tight  closure of the cuff without undue tension even with full passive motion.  Interval care had been repaired as well.  Wound irrigated.  Deltoid closed  with Vicryl.  Skin and subcutaneous tissue with Vicryl.  Portals closed with  nylon.  Subacromial injection 0.25% Marcaine.  Sterile compressive dressing  applied.  Sling applied.  Anesthesia reversed.  Brought to the recovery  room.  Tolerated the surgery well without complications.      Loreta Ave, M.D.  Electronically Signed     DFM/MEDQ  D:  11/19/2004  T:  11/19/2004  Job:  [id number]

## 2010-06-05 NOTE — Discharge Summary (Signed)
[name], [name]                        ACCOUNT NO.:  [id number]   MEDICAL RECORD NO.:  [id number]                   PATIENT TYPE:  INP   LOCATION:  5016                                 FACILITY:  [facility]   PHYSICIAN:  Loreta Ave, M.D.              DATE OF BIRTH:  July 01, 1933   DATE OF ADMISSION:  05/02/2003  DATE OF DISCHARGE:  05/05/2003                                 DISCHARGE SUMMARY   ADMISSION DIAGNOSIS:  Advanced degenerative joint disease of the left knee.   DISCHARGE DIAGNOSES:  1. Advanced degenerative joint disease of the left knee.  2. Hypertension.   PROCEDURE:  Left total knee replacement.   HISTORY:  A 77 year old male with chronic left knee pain which is worsening.  He has failed conservative treatment.  He know has pain at night as well as  pain with activities of daily living.  He has radiographic end-stage  degenerative joint disease of left knee.  For left total knee replacement.   HOSPITAL COURSE:  A 77 year old male admitted May 02, 2003, after  appropriate laboratory studies were obtained, as well as 1 gram of  vancomycin IV on call to the operating room, he was taken to the operating  room where he underwent a left total knee replacement.  He tolerated the  procedure well.  He was continued postoperatively on Vancomycin 1 gram IV  q.12h. times two doses.  Heparin 5000 units subcu q.12h. was begun until his  Coumadin became therapeutic.  Routine medications were continued.  A Foley  was placed intraoperatively.  Consultations with PT, OT and Rehab were made.  Ambulating and weightbearing as tolerated on the left with his knee  immobilizer.  CPM zero to 30 degrees for eight hours per day.  Hemovacs were  placed also intraoperatively.  He did have some burning around the meatus  from his Foley and Xylocaine jelly was used at the Foley site which did  improve his symptoms.  He did have some burning and he was started on  Pyridium 100 mg p.o.  t.i.d. for 24 hours.  On postoperative day two his  dressings were removed and hemovacs were pulled.  His wound was benign.  He  was weaned to oral pain medicines.  He had an unremarkable hospital course.  He did have some elevation in his glucose and a hemoglobin A1c was  performed.  The remainder of the hospital course was uneventful, and he was  discharged on the 17th to return back in followup in 10 to 14 days.  He was  discharged in improved condition.  Radiographic studies of May 02, 2003,  revealed left knee with well-seated components of total knee replacement.   LABORATORY DATA:  Admitted with hemoglobin of 16.4, hematocrit 45.6%, white  count 8100, platelets 182,000.  Discharge hemoglobin 9.8, hematocrit 28.4%,  white count 7400, platelets 140,000.  Discharge protime was 20.0 with an INR  of  2.2.  Preop sodium was 139, potassium 4.4, chloride 107, CO2 28, glucose  95, BUN 11, creatinine 1.0, calcium 9.4, total protein 5.9, albumin 4.1, AST  19, ALT 19, ALP 65, and total bilirubin 1.0.  Discharge sodium 138,  potassium 3.9, chloride 105, CO2 30, glucose 129, BUN 6, creatinine 1.0, and  calcium 8.2.  Glycosylated hemoglobin was 4.8.  Urinalysis of May 02, 2003, was benign.  On May 03, 2003, revealed zero to two white cells, no  bacteria or red cells were seen.  Blood type was B positive.  Antibody  screen negative.   DISCHARGE INSTRUCTIONS:  1. Given prescription for Percocet 5/325 one to two tabs q.4h. as needed for     pain.  2. Coumadin 5 mg 1/2 tablet until directed by __________.  3. Resume medications as taken prior to hospitalization.  4. Colace 100 mg twice a day.  5. Iron sulfate 325 mg one p.o. b.i.d. with food.   DIET:  As tolerated.   ACTIVITIES:  __________ PT.   DISCHARGE INSTRUCTIONS:  To follow the blue instructions sheet.  Call for an  appointment for about two weeks.  Keep the wound clean and dry and cover  with daily dressing.  May shower.    CONDITION ON DISCHARGE:  Discharged in improved condition.      Oris Drone Petrarca, P.A.-C.                Loreta Ave, M.D.    BDP/MEDQ  D:  06/13/2003  T:  06/15/2003  Job:  [id number]

## 2010-06-05 NOTE — Op Note (Signed)
[name], [name]                        ACCOUNT NO.:  [id number]   MEDICAL RECORD NO.:  [id number]                   PATIENT TYPE:  AMB   LOCATION:  DSC                                  FACILITY:  [facility]   PHYSICIAN:  Velora Heckler, M.D.                DATE OF BIRTH:  Jun 11, 1933   DATE OF PROCEDURE:  01/25/2002  DATE OF DISCHARGE:                                 OPERATIVE REPORT   PREOPERATIVE DIAGNOSIS:  Right inguinal hernia.   POSTOPERATIVE DIAGNOSIS:  Right inguinal hernia.   OPERATION:  Repair of right inguinal hernia with Prolene mesh.   SURGEON:  Velora Heckler, M.D.   ANESTHESIA:  General   ESTIMATED BLOOD LOSS:  Minimal   PREPARATION:  Betadine   COMPLICATIONS:  None   INDICATIONS FOR PROCEDURE:  This patient is [name] 77 year old white male seen at  the request of Dr. Marga Melnick for right inguinal hernia.  The patient  had [name] noted [name] bulge for approximately one month.  He has [name] burning  sensation.  The hernia has always been reducible.  The patient now presents  for repair.   DESCRIPTION OF PROCEDURE:  The procedure is done in operating room #4 at the  [facility].  The patient is brought to the operating room and  placed in [name] supine position on the operating room table.  Following the  administration of general anesthesia, the patient was prepped and draped in  the usual strict aseptic fashion.  After ascertaining that an adequate level  of anesthesia had been obtained, [name] right inguinal incision was made with [name]  #10 blade.  Dissection was carried down through the subcutaneous tissues.  Hemostasis was obtained with the electrocautery.  External oblique fascia  was incised in line with its fibers and extended through the external  inguinal ring.  Spermatic cord structures were encircled with [name] Penrose  drain.  Cord is dissected.  There is [name] large indirect inguinal hernia sac.  This was dissected away from the cord structures up to the level of  the  internal inguinal ring.  High ligation of the sac is performed with [name] 2-0  silk suture ligature.  The sac is then excised and discarded.  There is also  [name] direct inguinal hernia compartment with approximately [name] 1 cm defect in the  floor of the inguinal canal.  This contains incarcerated fatty tissue.  Fatty tissue is dissected out and reduced back through the hernia defect.  Defect is closed with [name] 3-0 Vicryl suture ligature.  The floor of the  inguinal canal is then repaired with [name] sheath of Prolene mesh.  Mesh was cut  to the appropriate dimensions.  It is secured to the pubic tubercle along  the inguinal ligament with [name] running 2-0 Novofil suture.  Mesh is split to  accommodate the cord structures.  The superior margin of the mesh  is secured  to the transversalis and internal oblique fascia with interrupted 2-0  Novofil sutures.  Tails of the mesh are overlapped and the inferior edges  were secured to the inguinal ligament with an interrupted 2-0 Novofil suture  placed lateral to the cord structures.  Good hemostasis was noted.  Local  field block is placed with Marcaine.  Cord structures are returned to the  inguinal canal.  External oblique fascia was closed with interrupted 3-0  Vicryl sutures.  Subcutaneous tissues are closed with interrupted 3-0 Vicryl  sutures.  Skin edges are anesthetized with local anesthetic.  The skin edges  are reapproximated with interrupted 4-0 Vicryl subcuticular sutures.  The  wound was  washed and dried and Benzoin and Steri-Strips are applied.  Sterile gauze  dressings are applied.  The patient is awakened from anesthesia and brought  to the recovery room in stable condition.  The patient tolerated the  procedure well.                                               Velora Heckler, M.D.    TMG/MEDQ  D:  01/25/2002  T:  01/25/2002  Job:  [id number]   cc:   Titus Dubin. Alwyn Ren, M.D. [facility]

## 2010-06-08 ENCOUNTER — Ambulatory Visit (INDEPENDENT_AMBULATORY_CARE_PROVIDER_SITE_OTHER): Payer: Medicare Other | Admitting: Family Medicine

## 2010-06-08 ENCOUNTER — Encounter: Payer: Self-pay | Admitting: Family Medicine

## 2010-06-08 VITALS — BP 130/80 | HR 79 | Temp 98.3°F | Wt 150.0 lb

## 2010-06-08 DIAGNOSIS — Z9889 Other specified postprocedural states: Secondary | ICD-10-CM

## 2010-06-08 DIAGNOSIS — R5383 Other fatigue: Secondary | ICD-10-CM

## 2010-06-08 DIAGNOSIS — R5381 Other malaise: Secondary | ICD-10-CM

## 2010-06-08 DIAGNOSIS — D510 Vitamin B12 deficiency anemia due to intrinsic factor deficiency: Secondary | ICD-10-CM

## 2010-06-08 LAB — CBC WITH DIFFERENTIAL/PLATELET
Basophils Relative: 0.5 % (ref 0.0–3.0)
Eosinophils Absolute: 0.1 10*3/uL (ref 0.0–0.7)
Eosinophils Relative: 2 % (ref 0.0–5.0)
HCT: 39 % (ref 39.0–52.0)
Hemoglobin: 12.8 g/dL — ABNORMAL LOW (ref 13.0–17.0)
Lymphs Abs: 1.1 10*3/uL (ref 0.7–4.0)
MCHC: 32.7 g/dL (ref 30.0–36.0)
MCV: 82.2 fl (ref 78.0–100.0)
Monocytes Absolute: 0.8 10*3/uL (ref 0.1–1.0)
Neutro Abs: 2.5 10*3/uL (ref 1.4–7.7)
RBC: 4.75 Mil/uL (ref 4.22–5.81)

## 2010-06-08 LAB — BASIC METABOLIC PANEL
BUN: 8 mg/dL (ref 6–23)
CO2: 28 mEq/L (ref 19–32)
Calcium: 8.8 mg/dL (ref 8.4–10.5)
GFR: 101.71 mL/min (ref >60.00)
Glucose, Bld: 97 mg/dL (ref 70–99)

## 2010-06-08 NOTE — Progress Notes (Signed)
77 yo with h/o pernicious anemia, CAD, HTN, HLD here for ER follow up. Notes reviewed.  2 month post op right TKR.  Was given an alernative to coumadin post op (factor Xa inhibitor). Feeling more fatigued lately. No CP or SOB. Still very active. Vitamin D normal. Colonoscopy UTD. Denies any changes in his bowels.  No blood in stool.    H/o pernicious anemia- Bone marrow biopsy on 10/30/09- hypercelleular marrow but no significan dyspoietic features or increased blasts. Lab Results  Component Value Date   WBC 5.9 04/02/2010   HGB 9.6* 04/02/2010   HCT 27.9* 04/02/2010   MCV 84.7 04/02/2010   PLT 158.0 04/02/2010   Lab Results  Component Value Date   VITAMINB12 >1500* 04/02/2010    The PMH, PSH, Social History, Family History, Medications, and allergies have been reviewed in [facility], and have been updated if relevant.  Review of Systems       See HPI General:  Complains of fatigue; denies weakness. CV:  Denies chest pain or discomfort. Resp:  Denies shortness of breath. GI:  Denies abdominal pain, change in bowel habits, and nausea.  Physical Exam BP 130/80  Pulse 79  Temp(Src) 98.3 F (36.8 C) (Oral)  Wt 150 lb (68.04 kg) General:  Well-developed well-nourished in no acute distress. Skin is warm and dry VSS, afebrile and nontoxic appearing. Abdomen:  soft and nontender. No masses palpated. No bruits. Neurologic:  grossly intact. Psych:  memory intact for recent and remote, normally interactive, good eye contact, not anxious appearing, and not depressed appearing.

## 2010-06-08 NOTE — Assessment & Plan Note (Signed)
Deteriorated. Likely multifactorial- s/p THR, then TKR, deconditioning likely playing a big role. Given h/o pernicious anemia and recent surgery, will check labs today. Orders Placed This Encounter  Procedures  . CBC w/Diff  . B12  . Basic Metabolic Panel (BMET)

## 2010-06-16 ENCOUNTER — Encounter: Payer: Self-pay | Admitting: Internal Medicine

## 2010-07-02 ENCOUNTER — Other Ambulatory Visit: Payer: Self-pay | Admitting: *Deleted

## 2010-07-02 MED ORDER — DOXAZOSIN MESYLATE 8 MG PO TABS: 4.0000 mg | ORAL_TABLET | Freq: Every day | ORAL | Status: DC

## 2010-07-13 ENCOUNTER — Emergency Department (HOSPITAL_COMMUNITY)
Admission: EM | Admit: 2010-07-13 | Discharge: 2010-07-13 | Disposition: A | Discharge status: Home / Self Care | Payer: Medicare Other | Attending: Emergency Medicine | Admitting: Emergency Medicine

## 2010-07-13 ENCOUNTER — Emergency Department (HOSPITAL_COMMUNITY): Payer: Medicare Other

## 2010-07-13 DIAGNOSIS — I498 Other specified cardiac arrhythmias: Secondary | ICD-10-CM | POA: Insufficient documentation

## 2010-07-13 DIAGNOSIS — R509 Fever, unspecified: Secondary | ICD-10-CM | POA: Insufficient documentation

## 2010-07-13 DIAGNOSIS — T675XXA Heat exhaustion, unspecified, initial encounter: Secondary | ICD-10-CM | POA: Insufficient documentation

## 2010-07-13 DIAGNOSIS — E785 Hyperlipidemia, unspecified: Secondary | ICD-10-CM | POA: Insufficient documentation

## 2010-07-13 DIAGNOSIS — E860 Dehydration: Secondary | ICD-10-CM | POA: Insufficient documentation

## 2010-07-13 DIAGNOSIS — I10 Essential (primary) hypertension: Secondary | ICD-10-CM | POA: Insufficient documentation

## 2010-07-13 DIAGNOSIS — R55 Syncope and collapse: Secondary | ICD-10-CM | POA: Insufficient documentation

## 2010-07-13 DIAGNOSIS — X30XXXA Exposure to excessive natural heat, initial encounter: Secondary | ICD-10-CM | POA: Insufficient documentation

## 2010-07-13 LAB — DIFFERENTIAL
Basophils Absolute: 0 10*3/uL (ref 0.0–0.1)
Lymphs Abs: 1 10*3/uL (ref 0.7–4.0)
Monocytes Absolute: 2.3 10*3/uL — ABNORMAL HIGH (ref 0.1–1.0)
Neutro Abs: 4.5 10*3/uL (ref 1.7–7.7)

## 2010-07-13 LAB — BASIC METABOLIC PANEL
BUN: 9 mg/dL (ref 6–23)
CO2: 24 mEq/L (ref 19–32)
GFR calc non Af Amer: >60 mL/min (ref >60)
Glucose, Bld: 99 mg/dL (ref 70–99)
Potassium: 3.3 mEq/L — ABNORMAL LOW (ref 3.5–5.1)
Sodium: 136 mEq/L (ref 135–145)

## 2010-07-13 LAB — CBC
HCT: 40.2 % (ref 39.0–52.0)
Hemoglobin: 13.1 g/dL (ref 13.0–17.0)
MCH: 25.9 pg — ABNORMAL LOW (ref 26.0–34.0)
MCHC: 32.6 g/dL (ref 30.0–36.0)
RDW: 15.7 % — ABNORMAL HIGH (ref 11.5–15.5)

## 2010-07-13 LAB — URINALYSIS, ROUTINE W REFLEX MICROSCOPIC
Glucose, UA: NEGATIVE mg/dL
Hgb urine dipstick: NEGATIVE
Specific Gravity, Urine: 1.013 (ref 1.005–1.030)
Urobilinogen, UA: 1 mg/dL (ref 0.0–1.0)

## 2010-07-13 LAB — URINE MICROSCOPIC-ADD ON

## 2010-07-13 LAB — CK TOTAL AND CKMB (NOT AT ARMC)
CK, MB: 1.7 ng/mL (ref 0.3–4.0)
Total CK: 41 U/L (ref 7–232)

## 2010-07-16 ENCOUNTER — Telehealth: Payer: Self-pay | Admitting: *Deleted

## 2010-07-16 NOTE — Telephone Encounter (Signed)
Patient advised as instructed via telephone.  Lab appt and nurse visit scheduled for Tuesday.

## 2010-07-16 NOTE — Telephone Encounter (Signed)
They sent me the note on Epic and I already reviewed it. Yes, he can have repeat labs.

## 2010-07-16 NOTE — Telephone Encounter (Signed)
Pt went to [place] ER on Monday for heat exhaustion.  His red cell count was low and they told him to have that repeated.  He is due a B12 injection and is planning to come in next Tuesday for that, would like to get labs at the same time.  OK to schedule?  Please review notes from ER in chart.

## 2010-07-21 ENCOUNTER — Other Ambulatory Visit: Payer: Medicare Other

## 2010-07-21 ENCOUNTER — Ambulatory Visit: Payer: Medicare Other

## 2010-07-23 ENCOUNTER — Ambulatory Visit: Payer: Medicare Other

## 2010-07-23 ENCOUNTER — Encounter: Payer: Self-pay | Admitting: Family Medicine

## 2010-07-23 ENCOUNTER — Ambulatory Visit (INDEPENDENT_AMBULATORY_CARE_PROVIDER_SITE_OTHER): Payer: Medicare Other | Admitting: Family Medicine

## 2010-07-23 ENCOUNTER — Other Ambulatory Visit: Payer: Medicare Other | Admitting: Family Medicine

## 2010-07-23 VITALS — BP 150/90 | HR 68 | Temp 98.2°F | Wt 147.8 lb

## 2010-07-23 DIAGNOSIS — R112 Nausea with vomiting, unspecified: Secondary | ICD-10-CM

## 2010-07-23 DIAGNOSIS — Z79899 Other long term (current) drug therapy: Secondary | ICD-10-CM

## 2010-07-23 NOTE — Progress Notes (Signed)
77 yo with h/o pernicious anemia, CAD, HTN, HLD here for chills and vomiting.    2 month post op right TKR.  Was given an alernative to coumadin post op (factor Xa inhibitor).  Last Monday was working outside in the heat, felt dizzy, sat down and vomited. Went to ER, told he had heat exhaustion and given IVFs. Felt much better. No diarrhea or abdominal pain.  Two days ago, woke up and just felt "off." A little dizzy but room wasn't spinning. Vomited again 3 times. No associated symptoms and again feels ok now.  Eating ok, last 3 pounds since last office visit. Some decreased appetite. Wt Readings from Last 3 Encounters:  07/23/10 147 lb 12 oz (67.019 kg)  06/08/10 150 lb (68.04 kg)  04/29/10 154 lb 1.3 oz (69.89 kg)    H/o pernicious anemia- Bone marrow biopsy on 10/30/09- hypercelleular marrow but no significant dyspoietic features or increased blasts. Lab Results  Component Value Date   WBC 7.8 07/13/2010   HGB 13.1 07/13/2010   HCT 40.2 07/13/2010   MCV 79.6 07/13/2010   PLT 81* 07/13/2010   Lab Results  Component Value Date   VITAMINB12 942* 06/08/2010    The PMH, PSH, Social History, Family History, Medications, and allergies have been reviewed in [facility], and have been updated if relevant.  Review of Systems       See HPI General:  Complains of fatigue; denies weakness. CV:  Denies chest pain or discomfort. Resp:  Denies shortness of breath. GI:  Denies abdominal pain, change in bowel habits, and nausea. Neuro:  No focal neurological changes  Physical Exam BP 150/90  Pulse 68  Temp(Src) 98.2 F (36.8 C) (Oral)  Wt 147 lb 12 oz (67.019 kg) General:  Well-developed well-nourished in no acute distress. Skin is warm and dry VSS, afebrile and nontoxic appearing. HEENT:  No nystagmus, PERRL, no scleral icterus Abdomen:  soft and nontender. No masses palpated. No bruits. Neurologic:  grossly intact. Psych:  memory intact for recent and remote, normally interactive, good  eye contact, not anxious appearing, and not depressed appearing.    Assessment and Plan: 1. Nausea & vomiting  Basic Metabolic Panel (BMET), Hepatic function panel, CBC w/Diff, B12, TSH  Unlikely gastroenteritis given that the two vomiting episodes occurred 8 days apart. No focal neurological deficits.   Etiology is unclear to me at this point and exam is unremarkable. ?possible heat exhaustion for first episode but not clear what caused the second episode. Symptoms and exam not consistent with vertigo.   EKG was unchanged in ER last week. Will check labs today and advised him to let me know if symptoms occur once again.

## 2010-07-24 ENCOUNTER — Other Ambulatory Visit: Payer: Self-pay | Admitting: Family Medicine

## 2010-07-24 DIAGNOSIS — E785 Hyperlipidemia, unspecified: Secondary | ICD-10-CM

## 2010-07-24 LAB — CBC WITH DIFFERENTIAL/PLATELET
Basophils Absolute: 0.1 10*3/uL (ref 0.0–0.1)
Eosinophils Absolute: 0.1 10*3/uL (ref 0.0–0.7)
Lymphocytes Relative: 19.9 % (ref 12.0–46.0)
MCHC: 33.7 g/dL (ref 30.0–36.0)
Neutrophils Relative %: 53.5 % (ref 43.0–77.0)
Platelets: 101 10*3/uL — ABNORMAL LOW (ref 150.0–400.0)
RDW: 17.6 % — ABNORMAL HIGH (ref 11.5–14.6)

## 2010-07-24 LAB — BASIC METABOLIC PANEL
CO2: 28 mEq/L (ref 19–32)
Chloride: 106 mEq/L (ref 96–112)
Creatinine, Ser: 1 mg/dL (ref 0.4–1.5)
Sodium: 141 mEq/L (ref 135–145)

## 2010-07-24 LAB — HEPATIC FUNCTION PANEL
ALT: 150 U/L — ABNORMAL HIGH (ref 0–53)
Alkaline Phosphatase: 490 U/L — ABNORMAL HIGH (ref 39–117)
Bilirubin, Direct: 0.6 mg/dL — ABNORMAL HIGH (ref 0.0–0.3)
Total Protein: 6 g/dL (ref 6.0–8.3)

## 2010-07-24 LAB — TSH: TSH: 0.84 u[IU]/mL (ref 0.35–5.50)

## 2010-08-03 ENCOUNTER — Encounter: Payer: Self-pay | Admitting: Family Medicine

## 2010-08-03 ENCOUNTER — Ambulatory Visit (INDEPENDENT_AMBULATORY_CARE_PROVIDER_SITE_OTHER): Payer: Medicare Other | Admitting: Family Medicine

## 2010-08-03 VITALS — BP 122/68 | HR 66 | Temp 98.4°F | Wt 147.8 lb

## 2010-08-03 DIAGNOSIS — R7989 Other specified abnormal findings of blood chemistry: Secondary | ICD-10-CM

## 2010-08-03 DIAGNOSIS — R899 Unspecified abnormal finding in specimens from other organs, systems and tissues: Secondary | ICD-10-CM

## 2010-08-03 DIAGNOSIS — R6889 Other general symptoms and signs: Secondary | ICD-10-CM

## 2010-08-03 DIAGNOSIS — A689 Relapsing fever, unspecified: Secondary | ICD-10-CM

## 2010-08-03 DIAGNOSIS — Z79899 Other long term (current) drug therapy: Secondary | ICD-10-CM

## 2010-08-03 DIAGNOSIS — Z125 Encounter for screening for malignant neoplasm of prostate: Secondary | ICD-10-CM

## 2010-08-03 NOTE — Progress Notes (Signed)
77 yo with h/o pernicious anemia, CAD, HTN, HLD here for persistent chills and fever.  2 month post op right TKR.  Was given an alernative to coumadin post op (factor Xa inhibitor).  Originally saw him on 07/23/2010 for these symptoms.  Note from 07/23/2010: Had two episodes of vomiting with chills, no fever. Went to ER after first episodes, advised hydration as it was thought to be heat stroke. Second episodes occurred 8 days later.  LFTs were elevated on 07/23/2010. Lab Results  Component Value Date   ALT 150* 07/23/2010   AST 84* 07/23/2010   ALKPHOS 490* 07/23/2010   BILITOT 1.3* 07/23/2010   Advised to stop all hepatotoxic agents and recheck labs in 2 weeks, including hepatitis panel.   Still having fever, Tmax 102, chills, although intermittent. Appetite decreased. Vomiting has resolved, still intermittently nauseated. Weight stable.  Wt Readings from Last 3 Encounters:  08/03/10 147 lb 12 oz (67.019 kg)  07/23/10 147 lb 12 oz (67.019 kg)  06/08/10 150 lb (68.04 kg)   No dysuria.  H/o pernicious anemia- Bone marrow biopsy on 10/30/09- hypercelleular marrow but no significant dyspoietic features or increased blasts.  Seeing Dr. Cyndie Chime next month. Lab Results  Component Value Date   WBC 5.5 07/23/2010   HGB 13.3 07/23/2010   HCT 39.4 07/23/2010   MCV 80.7 07/23/2010   PLT 101.0 Repeated and verified X2.* 07/23/2010   Lab Results  Component Value Date   VITAMINB12 >1500* 07/23/2010    The PMH, PSH, Social History, Family History, Medications, and allergies have been reviewed in [facility], and have been updated if relevant.  Review of Systems       See HPI General:  Complains of fatigue; denies weakness. CV:  Denies chest pain or discomfort. Resp:  Denies shortness of breath. GI:  Denies abdominal pain, change in bowel habits Neuro:  No focal neurological changes  Physical Exam BP 122/68  Pulse 66  Temp(Src) 98.4 F (36.9 C) (Oral)  Wt 147 lb 12 oz (67.019 kg) General:   Well-developed well-nourished in no acute distress. Skin is warm and dry, does appear pale today VSS, afebrile and nontoxic appearing. HEENT:  No nystagmus, PERRL, no scleral icterus Abdomen:  soft and nontender. No masses palpated. No bruits. Neurologic:  grossly intact. Psych:  memory intact for recent and remote, normally interactive, good eye contact, not anxious appearing, and not depressed appearing.    Assessment and Plan: 1. Recurrent fever   FUO Now with elevated LFTs. ? Hepatitis Will check hepatitis panel today, recheck LFTs, UA. Also check CBC. CT of abdomen to rule out malignancy as well.

## 2010-08-03 NOTE — Patient Instructions (Signed)
Please stop by to see Shirlee Limerick after you go to the lab

## 2010-08-04 ENCOUNTER — Telehealth: Payer: Self-pay | Admitting: Family Medicine

## 2010-08-04 DIAGNOSIS — A689 Relapsing fever, unspecified: Secondary | ICD-10-CM

## 2010-08-04 DIAGNOSIS — R110 Nausea: Secondary | ICD-10-CM

## 2010-08-04 LAB — POCT URINALYSIS DIPSTICK
Blood, UA: NEGATIVE
Glucose, UA: NEGATIVE
Nitrite, UA: NEGATIVE
Urobilinogen, UA: NEGATIVE

## 2010-08-04 LAB — HEPATIC FUNCTION PANEL
ALT: 135 U/L — ABNORMAL HIGH (ref 0–53)
Total Bilirubin: 2 mg/dL — ABNORMAL HIGH (ref 0.3–1.2)

## 2010-08-04 LAB — BASIC METABOLIC PANEL
Chloride: 109 mEq/L (ref 96–112)
Creatinine, Ser: 0.9 mg/dL (ref 0.4–1.5)
Potassium: 4.7 mEq/L (ref 3.5–5.1)

## 2010-08-04 LAB — PSA: PSA: 5.62 ng/mL — ABNORMAL HIGH (ref 0.10–4.00)

## 2010-08-04 LAB — GAMMA GT: GGT: 632 U/L — ABNORMAL HIGH (ref 7–51)

## 2010-08-04 NOTE — Progress Notes (Signed)
Addended by: Gilmer Mor on: 08/04/2010 08:49 AM   Modules accepted: Orders

## 2010-08-04 NOTE — Telephone Encounter (Signed)
Called [employer] to get prior auth for CT Abd with contrast. They would not give approval as they said their indication is to have an Abdominal US 1st before approving a CT Scan. Have called [name] and told her I would relyt this info to you and we would call her tomorrow with what you want to do. Her #s are [phone] and [phone]. Have cancelled the CT for tomm.

## 2010-08-05 ENCOUNTER — Other Ambulatory Visit: Payer: Medicare Other

## 2010-08-05 LAB — HEPATITIS PANEL, ACUTE
HCV Ab: NEGATIVE
Hep B C IgM: NEGATIVE
Hepatitis B Surface Ag: NEGATIVE

## 2010-08-05 LAB — CBC WITH DIFFERENTIAL/PLATELET
Platelets: 109 10*3/uL — ABNORMAL LOW (ref 150.0–400.0)
RDW: 17 % — ABNORMAL HIGH (ref 11.5–14.6)

## 2010-08-05 NOTE — Telephone Encounter (Signed)
Addended by: Dianne Dun on: 08/05/2010 07:35 AM   Modules accepted: Orders

## 2010-08-05 NOTE — Telephone Encounter (Signed)
Ok I will order abdominal ultrasound first.

## 2010-08-06 ENCOUNTER — Telehealth: Payer: Self-pay | Admitting: *Deleted

## 2010-08-06 ENCOUNTER — Ambulatory Visit
Admission: RE | Admit: 2010-08-06 | Discharge: 2010-08-06 | Disposition: A | Discharge status: Home / Self Care | Payer: Medicare Other | Source: Ambulatory Visit | Attending: Family Medicine | Admitting: Family Medicine

## 2010-08-06 ENCOUNTER — Other Ambulatory Visit: Payer: Self-pay | Admitting: Family Medicine

## 2010-08-06 ENCOUNTER — Telehealth: Payer: Self-pay | Admitting: Internal Medicine

## 2010-08-06 ENCOUNTER — Encounter: Payer: Self-pay | Admitting: *Deleted

## 2010-08-06 DIAGNOSIS — K760 Fatty (change of) liver, not elsewhere classified: Secondary | ICD-10-CM

## 2010-08-06 DIAGNOSIS — R110 Nausea: Secondary | ICD-10-CM

## 2010-08-06 NOTE — Telephone Encounter (Signed)
Patient says that you left him a message that you wanted him to call you. He is asking if you could call him back.

## 2010-08-06 NOTE — Telephone Encounter (Signed)
Scheduled patient on 08/17/10 at 2:00 PM with Dr. Juanda Chance.

## 2010-08-06 NOTE — Telephone Encounter (Signed)
Returned patients call.  Discussed U/s results.  See notes.

## 2010-08-11 ENCOUNTER — Other Ambulatory Visit: Payer: Medicare Other

## 2010-08-12 ENCOUNTER — Telehealth: Payer: Self-pay | Admitting: *Deleted

## 2010-08-12 ENCOUNTER — Ambulatory Visit (INDEPENDENT_AMBULATORY_CARE_PROVIDER_SITE_OTHER)
Admission: RE | Admit: 2010-08-12 | Discharge: 2010-08-12 | Disposition: A | Discharge status: Home / Self Care | Payer: Medicare Other | Source: Ambulatory Visit | Attending: Family Medicine | Admitting: Family Medicine

## 2010-08-12 DIAGNOSIS — A689 Relapsing fever, unspecified: Secondary | ICD-10-CM

## 2010-08-12 MED ORDER — IOHEXOL 300 MG/ML  SOLN
80.0000 mL | Freq: Once | INTRAMUSCULAR | Status: AC | PRN
  Administered 2010-08-12: 80 mL via INTRAVENOUS

## 2010-08-12 NOTE — Telephone Encounter (Signed)
Rose from CT called regarding pt's CT results from today.  She is asking that someone review them in Dr. Elmer Sow absence.

## 2010-08-13 NOTE — Telephone Encounter (Signed)
Dr. Dayton Martes has already reviewed

## 2010-08-17 ENCOUNTER — Ambulatory Visit (INDEPENDENT_AMBULATORY_CARE_PROVIDER_SITE_OTHER): Payer: Medicare Other | Admitting: Internal Medicine

## 2010-08-17 ENCOUNTER — Other Ambulatory Visit (INDEPENDENT_AMBULATORY_CARE_PROVIDER_SITE_OTHER): Payer: Medicare Other

## 2010-08-17 ENCOUNTER — Ambulatory Visit: Payer: Medicare Other

## 2010-08-17 ENCOUNTER — Encounter: Payer: Self-pay | Admitting: Internal Medicine

## 2010-08-17 DIAGNOSIS — D510 Vitamin B12 deficiency anemia due to intrinsic factor deficiency: Secondary | ICD-10-CM

## 2010-08-17 DIAGNOSIS — K831 Obstruction of bile duct: Secondary | ICD-10-CM

## 2010-08-17 DIAGNOSIS — R932 Abnormal findings on diagnostic imaging of liver and biliary tract: Secondary | ICD-10-CM

## 2010-08-17 LAB — HEPATIC FUNCTION PANEL
ALT: 52 U/L (ref 0–53)
AST: 40 U/L — ABNORMAL HIGH (ref 0–37)
Albumin: 3.8 g/dL (ref 3.5–5.2)

## 2010-08-17 LAB — CBC WITH DIFFERENTIAL/PLATELET
Eosinophils Absolute: 0.1 10*3/uL (ref 0.0–0.7)
Eosinophils Relative: 1 % (ref 0–5)
Hemoglobin: 13.1 g/dL (ref 13.0–17.0)
Lymphocytes Relative: 31 % (ref 12–46)
Lymphs Abs: 1.2 10*3/uL (ref 0.7–4.0)
MCH: 26.4 pg (ref 26.0–34.0)
MCV: 79.6 fL (ref 78.0–100.0)
Monocytes Relative: 24 % — ABNORMAL HIGH (ref 3–12)
Neutrophils Relative %: 43 % (ref 43–77)
RBC: 4.96 MIL/uL (ref 4.22–5.81)

## 2010-08-17 MED ORDER — CIPROFLOXACIN HCL 500 MG PO TABS: 500.0000 mg | ORAL_TABLET | Freq: Two times a day (BID) | ORAL | Status: AC

## 2010-08-17 NOTE — Patient Instructions (Addendum)
You have been scheduled for an ERCP with Dr Stan Head @ [facility]. Please follow written instructions given to you at your visit today. We have sent the following medications to your pharmacy for you to pick up at your convenience: Cipro 500 mg. Take 1 tablet by mouth twice daily x 7 days. Your physician has requested that you go to the basement for the following lab work before leaving today: Hepatic Panel, CA 19-9, CEA, PT, Amylase, Lipase, CBC Dr Dayton Martes, Dr Marlena Clipper,

## 2010-08-17 NOTE — Progress Notes (Signed)
[name] 15-Jul-1933 MRN [id number]     History of Present Illness:  This is a 77 year old white male with a several week history of intermittent nausea, vomiting and abnormal liver function tests. On July 23, 2010 in the Emergency Room, his AST was150 and ALT was 84 with an alkaline phosphatase of 490 and total bilirubin of 1.3. On 7/17, his AST was 100, ALT was 135 with an alkaline phosphatase of 449 and total bilirubin of 2.0 with a direct of 0.9. A CT scan of the abdomen showed mild to moderate intra-and extrahepatic biliary duct dilatation. The common bile duct was 8 mm on CT scan and 9 mm on abdominal ultrasound. His gallbladder appears normal. We saw [name] in 2008 for a colonoscopy. He has a family history of colon cancer. He had an adenomatous polyp and moderately severe diverticulosis. He also has a history of B12 deficiency with negative antiparietal cell antibody and negative anti-intrinsic factor antibodies. He has been on B12 supplements and his B12 level was 1500. He has been followed by Dr Marlena Clipper. He has lost about 3 pounds recently. He has mild pruritus but no bruising. His energy level has been low. He has had episodic fever, chills and shaking.   Past Medical History  Diagnosis Date  . Coronary atherosclerosis of unspecified type of vessel, native or graft   . Personal history of colonic polyps     adenomatous  . Hypertrophy of prostate without urinary obstruction and other lower urinary tract symptoms (LUTS)   . Unspecified essential hypertension   . Other and unspecified hyperlipidemia   . Diverticulosis of colon (without mention of hemorrhage)   . Unspecified sleep apnea   . Degeneration of intervertebral disc, site unspecified   . Cluster headache   . Pernicious anemia   . Neurogenic dysphagia   . Osteoarthritis    Past Surgical History  Procedure Date  . Inguinal herniorrhapy 2003  . Uvulectomy 1993  . Knee arthroscopy     5 times  . Total knee  arthroplasty 2003  . Rotator cuff repair 2006  . Biopsy prostate 2008    Benign  . Tonsillectomy   . Hernia repair   . Total hip arthroplasty     reports that he quit smoking about 44 years ago. He has never used smokeless tobacco. He reports that he does not drink alcohol or use illicit drugs. family history includes Colon cancer in his father and Peripheral vascular disease in his mother. Allergies  Allergen Reactions  . Penicillins   . Sulfamethoxazole W/Trimethoprim   . Sulfonamide Derivatives         Review of Systems: Denies gastroesophageal reflux. Dysphagia odynophagia or abdominal pain other than mild periumbilical discomfort  The remainder of the 10  point ROS is negative except as outlined in H&P   Physical Exam: General appearance  Well developed, in no distress. Eyes- mildly icteric. HEENT nontraumatic, normocephalic. Mouth no lesions, tongue papillated, no cheilosis. Neck supple without adenopathy, thyroid not enlarged, no carotid bruits, no JVD. Lungs Clear to auscultation bilaterally. Cor normal S1 normal S2, regular rhythm , no murmur,  quiet precordium. Abdomen mildly protuberant but soft. Nontender. Normoactive bowel sounds. No palpable mass, liver edge 1 cm below right costal margin. Rectal: Soft Hemoccult negative stool. Extremities no pedal edema. Skin no lesions, mildly icteric. Neurological alert and oriented x 3. Psychological normal mood and affect.  Assessment and Plan:  Problem #1 Abnormal radiographic appearance of the intra-and extrahepatic biliary ducts  consistent with extrahepatic biliary obstruction in the setting of abnormal liver function tests and mildly increased bilirubin. This needs to be properly evaluated to rule out ampullary carcinoma, pancreatic carcinoma or benign biliary obstruction. A CT scan did not show abnormal mass or adenopathy. He will need ERCP with bile duct cannulation, possible sphincterotomy or stent placement  depending on the nature of the obstruction. We will obtain tumor markers today and start patient on Cipro 500 mg twice a day. I have discussed the case with Dr.Gessner who is our hospital physician and he will accommodate patient tomorrow for an ERCP. Depending on the findings, we will make further disposition such as an EUS or oncology consultation.  Problem #2 B12 deficiency. Patient's last B12 level was normal. Autoimmune markers for pernicious anemia are negative. He may have B12 malabsorption due to small bowl disease rather than PA. He may need a small bowel biopsy to rule out sprue.  Problem #3 Family history of colon cancer. He would be due for a colonoscopy in December 2013.   08/17/2010 Lina Sar

## 2010-08-18 ENCOUNTER — Other Ambulatory Visit: Payer: Self-pay | Admitting: Internal Medicine

## 2010-08-18 ENCOUNTER — Ambulatory Visit (HOSPITAL_COMMUNITY)
Admission: RE | Admit: 2010-08-18 | Discharge: 2010-08-18 | Disposition: A | Discharge status: Home / Self Care | Payer: Medicare Other | Source: Ambulatory Visit | Attending: Internal Medicine | Admitting: Internal Medicine

## 2010-08-18 ENCOUNTER — Other Ambulatory Visit: Payer: Medicare Other | Admitting: Internal Medicine

## 2010-08-18 DIAGNOSIS — K831 Obstruction of bile duct: Secondary | ICD-10-CM

## 2010-08-18 DIAGNOSIS — K8051 Calculus of bile duct without cholangitis or cholecystitis with obstruction: Secondary | ICD-10-CM | POA: Insufficient documentation

## 2010-08-18 DIAGNOSIS — K219 Gastro-esophageal reflux disease without esophagitis: Secondary | ICD-10-CM | POA: Insufficient documentation

## 2010-08-18 DIAGNOSIS — G4733 Obstructive sleep apnea (adult) (pediatric): Secondary | ICD-10-CM | POA: Insufficient documentation

## 2010-08-18 DIAGNOSIS — I10 Essential (primary) hypertension: Secondary | ICD-10-CM | POA: Insufficient documentation

## 2010-08-18 DIAGNOSIS — K8050 Calculus of bile duct without cholangitis or cholecystitis without obstruction: Secondary | ICD-10-CM

## 2010-08-18 LAB — CANCER ANTIGEN 19-9: CA 19-9: 20 U/mL (ref <35.0)

## 2010-08-19 ENCOUNTER — Telehealth: Payer: Self-pay | Admitting: Internal Medicine

## 2010-08-19 NOTE — Telephone Encounter (Signed)
Patient had ERCP with removal of stone, sphincterotomy on 08/18/10 by Dr. Leone Payor. Dr. Leone Payor states in report: consideration of cholecystectomy sensible. Will discuss with Dr. Juanda Chance. Patient's wife has scheduled patient at [facility] on 09/24/10 and wants to be seen eariler at [facility]. Dr. Juanda Chance, is this your recommendation?

## 2010-08-19 NOTE — Telephone Encounter (Signed)
It is OK to wait till Sept.6,2012. No emergency.

## 2010-08-19 NOTE — Telephone Encounter (Signed)
Patient had ERCP with removal of stones, sphincterotomy

## 2010-08-19 NOTE — Telephone Encounter (Signed)
Spoke with patient's wife and gave her Dr. Regino Schultze recommendation.

## 2010-08-20 ENCOUNTER — Telehealth: Payer: Self-pay | Admitting: *Deleted

## 2010-08-20 NOTE — Telephone Encounter (Signed)
[name] says he saw Dr. Lina Sar recently for an Endoscopy and was informed by them that he may have been exposed to the chickenpox virus.  He was told what symptoms to be on the lookout for and he says he does not have any of those symptoms but they suggested he make you aware of the situation in case signs and symptoms appear.

## 2010-08-20 NOTE — Telephone Encounter (Signed)
Please check to see if he has had his shingles vaccine and then call him to get details because I am not sure I understand the situation.

## 2010-08-20 NOTE — Telephone Encounter (Signed)
Patient had zoster vaccine 02/2007.  Left message on machine at home for patient to return call.

## 2010-08-21 NOTE — Telephone Encounter (Signed)
Patient said he was called from Dr. Verlee Monte Brodie's office saying that his lab results indicated that he may have been exposed to the chickenpox virus and they informed him of several symptoms and told him that if he experienced any of these symptoms to report them to you.  Again, he has not experienced any of these symptoms and did have the shingles vaccine previously.  He has an appt with a surgeon in early September to possibly get his gallbladder removed.

## 2010-08-24 ENCOUNTER — Telehealth: Payer: Self-pay | Admitting: *Deleted

## 2010-08-24 NOTE — Telephone Encounter (Signed)
Yes should be ok to resume them now and cannot imagine that it would be a problem to go on a trip. Have fun and bring [place] back some great pictures!

## 2010-08-24 NOTE — Telephone Encounter (Signed)
Patient advised as instructed via telephone. 

## 2010-08-24 NOTE — Telephone Encounter (Signed)
Pt had a procedure last week, they found gallstones, He is to see surgeon Dr Ricky Stabs next month for consult and gall bladder removal, for last weeks procedure he was taken off simvastatin and tyl, he wants to know if it is ok to resume them now. He is planing a trip this month 8th, 9th, and 10th wants to know if you think its ok for him to go. It's a road trip to [facility] [place]. Pt states he feels fine and doesn't think traveling will be a problem but wanted to run it by you.

## 2010-08-31 ENCOUNTER — Encounter (HOSPITAL_BASED_OUTPATIENT_CLINIC_OR_DEPARTMENT_OTHER): Payer: Medicare Other | Admitting: Oncology

## 2010-08-31 ENCOUNTER — Other Ambulatory Visit: Payer: Self-pay | Admitting: Oncology

## 2010-08-31 DIAGNOSIS — D510 Vitamin B12 deficiency anemia due to intrinsic factor deficiency: Secondary | ICD-10-CM

## 2010-08-31 DIAGNOSIS — D72819 Decreased white blood cell count, unspecified: Secondary | ICD-10-CM

## 2010-08-31 DIAGNOSIS — D696 Thrombocytopenia, unspecified: Secondary | ICD-10-CM

## 2010-08-31 DIAGNOSIS — E538 Deficiency of other specified B group vitamins: Secondary | ICD-10-CM

## 2010-08-31 LAB — CBC WITH DIFFERENTIAL/PLATELET
BASO%: 0.3 % (ref 0.0–2.0)
Basophils Absolute: 0 10*3/uL (ref 0.0–0.1)
Eosinophils Absolute: 0 10*3/uL (ref 0.0–0.5)
HCT: 39.8 % (ref 38.4–49.9)
HGB: 13 g/dL (ref 13.0–17.1)
LYMPH%: 31.1 % (ref 14.0–49.0)
MCHC: 32.7 g/dL (ref 32.0–36.0)
MONO#: 1 10*3/uL — ABNORMAL HIGH (ref 0.1–0.9)
NEUT%: 40 % (ref 39.0–75.0)
Platelets: 89 10*3/uL — ABNORMAL LOW (ref 140–400)
WBC: 3.6 10*3/uL — ABNORMAL LOW (ref 4.0–10.3)

## 2010-08-31 LAB — MORPHOLOGY: PLT EST: DECREASED

## 2010-09-08 ENCOUNTER — Ambulatory Visit (INDEPENDENT_AMBULATORY_CARE_PROVIDER_SITE_OTHER): Payer: Medicare Other | Admitting: Family Medicine

## 2010-09-08 DIAGNOSIS — D510 Vitamin B12 deficiency anemia due to intrinsic factor deficiency: Secondary | ICD-10-CM

## 2010-09-08 MED ORDER — CYANOCOBALAMIN 1000 MCG/ML IJ SOLN
1000.0000 ug | Freq: Once | INTRAMUSCULAR | Status: AC
  Administered 2010-09-08: 1000 ug via INTRAMUSCULAR

## 2010-09-08 NOTE — Progress Notes (Signed)
Vitamin B12 injection given during nurse visit today. 

## 2010-09-24 ENCOUNTER — Encounter (INDEPENDENT_AMBULATORY_CARE_PROVIDER_SITE_OTHER): Payer: Medicare Other | Admitting: Surgery

## 2010-10-05 ENCOUNTER — Ambulatory Visit (INDEPENDENT_AMBULATORY_CARE_PROVIDER_SITE_OTHER): Payer: Medicare Other | Admitting: Surgery

## 2010-10-05 ENCOUNTER — Encounter (INDEPENDENT_AMBULATORY_CARE_PROVIDER_SITE_OTHER): Payer: Self-pay | Admitting: Surgery

## 2010-10-05 VITALS — BP 126/64 | HR 64 | Temp 97.8°F | Ht 62.0 in | Wt 154.0 lb

## 2010-10-05 DIAGNOSIS — K8044 Calculus of bile duct with chronic cholecystitis without obstruction: Secondary | ICD-10-CM

## 2010-10-05 DIAGNOSIS — K8040 Calculus of bile duct with cholecystitis, unspecified, without obstruction: Secondary | ICD-10-CM

## 2010-10-05 NOTE — Progress Notes (Signed)
Chief Complaint  Patient presents with  . Other    est pt new prob- eval GB    HISTORY: Patient is a 77 year old male who developed nausea vomiting and chills in July 2012. Initial workup by his primary physician demonstrated abnormal liver function test. Patient subsequently underwent abdominal ultrasound and CT scan of the abdomen showing biliary dilatation. Patient had ERCP performed on August 18, 2010. He was found to have choledocholithiasis which was cleared. Patient is now referred for consideration for cholecystectomy.  Patient did experience acholic stools and dark urine during the summer months prior to his ERCP. He has no other history of hepatobiliary disease. He has been symptom-free since his procedure in late July. Patient does have a daughter who require cholecystectomy many years ago.   Past Medical History  Diagnosis Date  . Coronary atherosclerosis of unspecified type of vessel, native or graft   . Personal history of colonic polyps     adenomatous  . Hypertrophy of prostate without urinary obstruction and other lower urinary tract symptoms (LUTS)   . Unspecified essential hypertension   . Other and unspecified hyperlipidemia   . Diverticulosis of colon (without mention of hemorrhage)   . Unspecified sleep apnea   . Degeneration of intervertebral disc, site unspecified   . Cluster headache   . Pernicious anemia   . Neurogenic dysphagia   . Osteoarthritis   . CHF (congestive heart failure)      Current Outpatient Prescriptions  Medication Sig Dispense Refill  . aspirin 81 MG tablet Take 81 mg by mouth daily.        Marland Kitchen doxazosin (CARDURA) 8 MG tablet Take 0.5 tablets (4 mg total) by mouth daily.  30 tablet  6  . fexofenadine (ALLEGRA) 180 MG tablet Take 1 tablet (180 mg total) by mouth daily.      . metoprolol succinate (TOPROL-XL) 25 MG 24 hr tablet Take by mouth daily. Take one half tablet daily       . nitroGLYCERIN (NITROSTAT) 0.4 MG SL tablet Place 0.4 mg under  the tongue every 5 (five) minutes as needed. Up to 3 doses       . Omega-3 Fatty Acids (FISH OIL) 1200 MG CAPS Take 1 capsule by mouth daily.        Marland Kitchen oxyCODONE-acetaminophen (PERCOCET) 5-325 MG per tablet Take 1 tablet by mouth every 4 (four) hours as needed.        . simvastatin (ZOCOR) 40 MG tablet daily.         Allergies  Allergen Reactions  . Penicillins   . Sulfamethoxazole W/Trimethoprim   . Sulfonamide Derivatives      Family History  Problem Relation Age of Onset  . Colon cancer Father   . Cancer Father     colon  . Peripheral vascular disease Mother      History   Social History  . Marital Status: Married    Spouse Name: N/A    Number of Children: 2  . Years of Education: N/A   Occupational History  . retired    Social History Main Topics  . Smoking status: Former Smoker    Types: Pipe    Quit date: 01/18/1966  . Smokeless tobacco: Never Used  . Alcohol Use: No  . Drug Use: No  . Sexually Active: None   Other Topics Concern  . None   Social History Narrative  . None     REVIEW OF SYSTEMS - PERTINENT POSITIVES ONLY: Positive for acholic  stools, resolved. Positive for dark urine, resolved. Positive for nausea and vomiting, resolved.   EXAM: Filed Vitals:   10/05/10 0853  BP: 126/64  Pulse: 64  Temp: 97.8 F (36.6 C)    HEENT: normocephalic; pupils equal and reactive; sclerae clear; dentition good; mucous membranes moist NECK:  ; symmetric on extension; no palpable anterior or posterior cervical lymphadenopathy; no supraclavicular masses; no tenderness CHEST: clear to auscultation bilaterally without rales, rhonchi, or wheezes CARDIAC: regular rate and rhythm without significant murmur; peripheral pulses are full ABDOMEN: Abdomen is soft without distention. It is slightly protuberant. Right inguinal wound is well healed. No other sign of hernia. On palpation there is no tenderness. There is no hepatosplenomegaly. There are no  masses. EXT:  non-tender without edema; no deformity NEURO: no gross focal deficits; no sign of tremor   LABORATORY RESULTS: See E-Chart for most recent results   RADIOLOGY RESULTS: See E-Chart or I-Site for most recent results   IMPRESSION: #1 history of choledocholithiasis, status post ERCP #2 probable chronic cholecystitis, rule out cholelithiasis #3 pernicious anemia   PLAN: I had a lengthy discussion with the patient and his wife regarding cholecystectomy. I provided with written literature of the procedure. I believe he is an excellent candidate for laparoscopic cholecystectomy. We will plan for cholangiography at the time of surgery to assure ourselves that the common bile duct is normal without obstruction. We discussed the hospital stay to be anticipated. We discussed the possibility of open surgery. They understand and agree to proceed.  The risks and benefits of the procedure have been discussed at length with the patient.  The patient understands the proposed procedure, potential alternative treatments, and the course of recovery to be expected.  All of the patient's questions have been answered at this time.  The patient wishes to proceed with surgery and will schedule a date for their procedure through our office staff.   Velora Heckler, MD, FACS General & Endocrine Surgery [facility]      Visit Diagnoses: 1. Choledocholithiasis with chronic cholecystitis     Primary Care Physician: [name], MD, MD

## 2010-10-16 ENCOUNTER — Ambulatory Visit (INDEPENDENT_AMBULATORY_CARE_PROVIDER_SITE_OTHER): Payer: Medicare Other | Admitting: Family Medicine

## 2010-10-16 DIAGNOSIS — E538 Deficiency of other specified B group vitamins: Secondary | ICD-10-CM

## 2010-10-16 MED ORDER — CYANOCOBALAMIN 1000 MCG/ML IJ SOLN
1000.0000 ug | Freq: Once | INTRAMUSCULAR | Status: AC
  Administered 2010-10-16: 1000 ug via INTRAMUSCULAR

## 2010-10-18 NOTE — Progress Notes (Signed)
  Subjective:    Patient ID: [name], male    DOB: April 12, 1933, 77 y.o.   MRN: [id number]  HPI Here for injection only   Review of Systems     Objective:   Physical Exam        Assessment & Plan:

## 2010-11-09 ENCOUNTER — Encounter (INDEPENDENT_AMBULATORY_CARE_PROVIDER_SITE_OTHER): Payer: Medicare Other | Admitting: Surgery

## 2010-11-12 ENCOUNTER — Ambulatory Visit: Payer: Medicare Other

## 2010-11-12 ENCOUNTER — Ambulatory Visit (INDEPENDENT_AMBULATORY_CARE_PROVIDER_SITE_OTHER): Payer: Medicare Other | Admitting: *Deleted

## 2010-11-12 DIAGNOSIS — D510 Vitamin B12 deficiency anemia due to intrinsic factor deficiency: Secondary | ICD-10-CM

## 2010-11-12 DIAGNOSIS — Z23 Encounter for immunization: Secondary | ICD-10-CM

## 2010-11-12 MED ORDER — CYANOCOBALAMIN 1000 MCG/ML IJ SOLN
1000.0000 ug | Freq: Once | INTRAMUSCULAR | Status: AC
  Administered 2010-11-12: 1000 ug via INTRAMUSCULAR

## 2010-11-16 ENCOUNTER — Ambulatory Visit: Payer: Medicare Other

## 2010-11-18 ENCOUNTER — Encounter (HOSPITAL_COMMUNITY): Payer: Self-pay

## 2010-11-18 ENCOUNTER — Encounter (HOSPITAL_COMMUNITY): Payer: Medicare Other

## 2010-11-18 LAB — URINALYSIS, ROUTINE W REFLEX MICROSCOPIC
Bilirubin Urine: NEGATIVE
Leukocytes, UA: NEGATIVE
Nitrite: NEGATIVE
Specific Gravity, Urine: 1.023 (ref 1.005–1.030)
Urobilinogen, UA: 0.2 mg/dL (ref 0.0–1.0)
pH: 5.5 (ref 5.0–8.0)

## 2010-11-18 LAB — PROTIME-INR: INR: 1.08 (ref 0.00–1.49)

## 2010-11-18 LAB — DIFFERENTIAL
Basophils Absolute: 0 10*3/uL (ref 0.0–0.1)
Eosinophils Absolute: 0 10*3/uL (ref 0.0–0.7)
Lymphocytes Relative: 22 % (ref 12–46)
Monocytes Relative: 28 % — ABNORMAL HIGH (ref 3–12)
Neutrophils Relative %: 49 % (ref 43–77)

## 2010-11-18 LAB — COMPREHENSIVE METABOLIC PANEL
ALT: 19 U/L (ref 0–53)
AST: 24 U/L (ref 0–37)
CO2: 28 mEq/L (ref 19–32)
Calcium: 9.7 mg/dL (ref 8.4–10.5)
Creatinine, Ser: 1 mg/dL (ref 0.50–1.35)
GFR calc Af Amer: 83 mL/min — ABNORMAL LOW (ref >90)
GFR calc non Af Amer: 71 mL/min — ABNORMAL LOW (ref >90)
Glucose, Bld: 93 mg/dL (ref 70–99)
Sodium: 142 mEq/L (ref 135–145)
Total Protein: 6.2 g/dL (ref 6.0–8.3)

## 2010-11-18 LAB — SURGICAL PCR SCREEN
MRSA, PCR: NEGATIVE
Staphylococcus aureus: NEGATIVE

## 2010-11-18 LAB — CBC
Hemoglobin: 13.4 g/dL (ref 13.0–17.0)
Platelets: 63 10*3/uL — ABNORMAL LOW (ref 150–400)
RBC: 5.09 MIL/uL (ref 4.22–5.81)
WBC: 4.3 10*3/uL (ref 4.0–10.5)

## 2010-11-19 NOTE — Progress Notes (Signed)
Faxed to [facility] [phone]

## 2010-11-23 NOTE — Pre-Procedure Instructions (Signed)
PREOP CBC REPORT FAXED TO DR. GERKIN'S OFFICE-PLATELET CT 63,000.  FAXED NOTE RECEIVED FROM DR. GERKIN TO REPEAT STAT CBC AM OF SURGERY

## 2010-11-24 ENCOUNTER — Ambulatory Visit (HOSPITAL_COMMUNITY): Payer: Medicare Other

## 2010-11-24 ENCOUNTER — Ambulatory Visit (HOSPITAL_COMMUNITY)
Admission: RE | Admit: 2010-11-24 | Discharge: 2010-11-25 | Disposition: A | Discharge status: Home / Self Care | Payer: Medicare Other | Source: Ambulatory Visit | Attending: Surgery | Admitting: Surgery

## 2010-11-24 ENCOUNTER — Encounter (HOSPITAL_COMMUNITY): Payer: Self-pay | Admitting: Anesthesiology

## 2010-11-24 ENCOUNTER — Ambulatory Visit (HOSPITAL_COMMUNITY): Payer: Medicare Other | Admitting: Anesthesiology

## 2010-11-24 ENCOUNTER — Encounter (HOSPITAL_COMMUNITY)
Admission: RE | Disposition: A | Discharge status: Home / Self Care | Payer: Self-pay | Source: Ambulatory Visit | Attending: Surgery

## 2010-11-24 ENCOUNTER — Encounter (HOSPITAL_COMMUNITY): Payer: Self-pay | Admitting: *Deleted

## 2010-11-24 ENCOUNTER — Other Ambulatory Visit (INDEPENDENT_AMBULATORY_CARE_PROVIDER_SITE_OTHER): Payer: Self-pay | Admitting: Surgery

## 2010-11-24 DIAGNOSIS — Z01812 Encounter for preprocedural laboratory examination: Secondary | ICD-10-CM | POA: Insufficient documentation

## 2010-11-24 DIAGNOSIS — E785 Hyperlipidemia, unspecified: Secondary | ICD-10-CM | POA: Insufficient documentation

## 2010-11-24 DIAGNOSIS — I509 Heart failure, unspecified: Secondary | ICD-10-CM | POA: Insufficient documentation

## 2010-11-24 DIAGNOSIS — K811 Chronic cholecystitis: Secondary | ICD-10-CM

## 2010-11-24 DIAGNOSIS — Z79899 Other long term (current) drug therapy: Secondary | ICD-10-CM | POA: Insufficient documentation

## 2010-11-24 DIAGNOSIS — I10 Essential (primary) hypertension: Secondary | ICD-10-CM | POA: Insufficient documentation

## 2010-11-24 DIAGNOSIS — I2510 Atherosclerotic heart disease of native coronary artery without angina pectoris: Secondary | ICD-10-CM | POA: Insufficient documentation

## 2010-11-24 DIAGNOSIS — G4730 Sleep apnea, unspecified: Secondary | ICD-10-CM | POA: Insufficient documentation

## 2010-11-24 DIAGNOSIS — K8064 Calculus of gallbladder and bile duct with chronic cholecystitis without obstruction: Secondary | ICD-10-CM | POA: Insufficient documentation

## 2010-11-24 DIAGNOSIS — D510 Vitamin B12 deficiency anemia due to intrinsic factor deficiency: Secondary | ICD-10-CM | POA: Diagnosis present

## 2010-11-24 DIAGNOSIS — K8044 Calculus of bile duct with chronic cholecystitis without obstruction: Secondary | ICD-10-CM | POA: Diagnosis present

## 2010-11-24 DIAGNOSIS — K8060 Calculus of gallbladder and bile duct with cholecystitis, unspecified, without obstruction: Secondary | ICD-10-CM | POA: Insufficient documentation

## 2010-11-24 DIAGNOSIS — N400 Enlarged prostate without lower urinary tract symptoms: Secondary | ICD-10-CM | POA: Insufficient documentation

## 2010-11-24 HISTORY — PX: CHOLECYSTECTOMY: SHX55

## 2010-11-24 LAB — CBC
HCT: 41.6 % (ref 39.0–52.0)
Hemoglobin: 13.8 g/dL (ref 13.0–17.0)
MCHC: 33.2 g/dL (ref 30.0–36.0)
RBC: 5.2 MIL/uL (ref 4.22–5.81)

## 2010-11-24 SURGERY — LAPAROSCOPIC CHOLECYSTECTOMY WITH INTRAOPERATIVE CHOLANGIOGRAM
Anesthesia: General | Site: Abdomen | Wound class: Clean

## 2010-11-24 MED ORDER — GLYCOPYRROLATE 0.2 MG/ML IJ SOLN
INTRAMUSCULAR | Status: DC | PRN
  Administered 2010-11-24: 0.2 mg via INTRAVENOUS

## 2010-11-24 MED ORDER — HYDROCODONE-ACETAMINOPHEN 5-325 MG PO TABS: 1.0000 | ORAL_TABLET | ORAL | Status: DC | PRN

## 2010-11-24 MED ORDER — HYDROMORPHONE HCL PF 1 MG/ML IJ SOLN
INTRAMUSCULAR | Status: AC
  Filled 2010-11-24: qty 1

## 2010-11-24 MED ORDER — BUPIVACAINE HCL (PF) 0.5 % IJ SOLN
INTRAMUSCULAR | Status: DC | PRN
  Administered 2010-11-24: 30 mL

## 2010-11-24 MED ORDER — ACETAMINOPHEN 500 MG PO TABS: 500.0000 mg | ORAL_TABLET | Freq: Four times a day (QID) | ORAL | Status: DC | PRN

## 2010-11-24 MED ORDER — SIMVASTATIN 40 MG PO TABS
40.0000 mg | ORAL_TABLET | Freq: Every day | ORAL | Status: DC
  Filled 2010-11-24: qty 1

## 2010-11-24 MED ORDER — HYDROMORPHONE HCL PF 1 MG/ML IJ SOLN: 0.5000 mg | INTRAMUSCULAR | Status: DC | PRN

## 2010-11-24 MED ORDER — LACTATED RINGERS IV SOLN
INTRAVENOUS | Status: DC | PRN
  Administered 2010-11-24 (×3): via INTRAVENOUS

## 2010-11-24 MED ORDER — IOHEXOL 300 MG/ML  SOLN
INTRAMUSCULAR | Status: AC
  Filled 2010-11-24: qty 1

## 2010-11-24 MED ORDER — CISATRACURIUM BESYLATE 2 MG/ML IV SOLN
INTRAVENOUS | Status: DC | PRN
  Administered 2010-11-24: 10 mg via INTRAVENOUS

## 2010-11-24 MED ORDER — METOPROLOL SUCCINATE 12.5 MG HALF TABLET
12.5000 mg | ORAL_TABLET | Freq: Every day | ORAL | Status: DC
  Filled 2010-11-24: qty 1

## 2010-11-24 MED ORDER — CIPROFLOXACIN IN D5W 400 MG/200ML IV SOLN
INTRAVENOUS | Status: AC
  Filled 2010-11-24: qty 200

## 2010-11-24 MED ORDER — HYDROMORPHONE HCL PF 1 MG/ML IJ SOLN
0.2500 mg | INTRAMUSCULAR | Status: DC | PRN
  Administered 2010-11-24 (×4): 0.25 mg via INTRAVENOUS

## 2010-11-24 MED ORDER — LACTATED RINGERS IR SOLN
Status: DC | PRN
  Administered 2010-11-24: 1000 mL

## 2010-11-24 MED ORDER — NITROGLYCERIN 0.4 MG SL SUBL: 0.4000 mg | SUBLINGUAL_TABLET | SUBLINGUAL | Status: DC | PRN

## 2010-11-24 MED ORDER — ETOMIDATE 2 MG/ML IV SOLN
INTRAVENOUS | Status: DC | PRN
  Administered 2010-11-24: 14 mg via INTRAVENOUS

## 2010-11-24 MED ORDER — NEOSTIGMINE METHYLSULFATE 1 MG/ML IJ SOLN
INTRAMUSCULAR | Status: DC | PRN
  Administered 2010-11-24: 1 mg via INTRAVENOUS

## 2010-11-24 MED ORDER — KCL IN DEXTROSE-NACL 20-5-0.2 MEQ/L-%-% IV SOLN
INTRAVENOUS | Status: DC
  Administered 2010-11-24: 17:00:00 via INTRAVENOUS
  Filled 2010-11-24 (×3): qty 1000

## 2010-11-24 MED ORDER — ONDANSETRON HCL 4 MG/2ML IJ SOLN
INTRAMUSCULAR | Status: DC | PRN
  Administered 2010-11-24: 4 mg via INTRAVENOUS

## 2010-11-24 MED ORDER — ACETAMINOPHEN 10 MG/ML IV SOLN
INTRAVENOUS | Status: AC
  Filled 2010-11-24: qty 100

## 2010-11-24 MED ORDER — METOPROLOL SUCCINATE 12.5 MG HALF TABLET
12.5000 mg | ORAL_TABLET | Freq: Once | ORAL | Status: DC
  Filled 2010-11-24: qty 1

## 2010-11-24 MED ORDER — FENTANYL CITRATE 0.05 MG/ML IJ SOLN
INTRAMUSCULAR | Status: DC | PRN
  Administered 2010-11-24: 50 ug via INTRAVENOUS
  Administered 2010-11-24: 100 ug via INTRAVENOUS

## 2010-11-24 MED ORDER — BUPIVACAINE HCL (PF) 0.5 % IJ SOLN
INTRAMUSCULAR | Status: AC
  Filled 2010-11-24: qty 30

## 2010-11-24 MED ORDER — IOHEXOL 300 MG/ML  SOLN
INTRAMUSCULAR | Status: DC | PRN
  Administered 2010-11-24: 50 mL

## 2010-11-24 MED ORDER — CIPROFLOXACIN IN D5W 400 MG/200ML IV SOLN
INTRAVENOUS | Status: DC | PRN
  Administered 2010-11-24: 400 mg via INTRAVENOUS

## 2010-11-24 MED ORDER — PROMETHAZINE HCL 25 MG/ML IJ SOLN: 6.2500 mg | INTRAMUSCULAR | Status: DC | PRN

## 2010-11-24 MED ORDER — ACETAMINOPHEN 325 MG PO TABS
650.0000 mg | ORAL_TABLET | ORAL | Status: DC | PRN
  Administered 2010-11-24: 650 mg via ORAL
  Filled 2010-11-24: qty 2

## 2010-11-24 MED ORDER — LACTATED RINGERS IV SOLN: INTRAVENOUS | Status: DC | PRN

## 2010-11-24 MED ORDER — DOXAZOSIN MESYLATE 2 MG PO TABS
2.0000 mg | ORAL_TABLET | Freq: Every day | ORAL | Status: DC
  Filled 2010-11-24: qty 1

## 2010-11-24 MED ORDER — DOXAZOSIN MESYLATE 2 MG PO TABS: 2.0000 mg | ORAL_TABLET | ORAL | Status: DC

## 2010-11-24 MED ORDER — ACETAMINOPHEN 10 MG/ML IV SOLN
INTRAVENOUS | Status: DC | PRN
  Administered 2010-11-24: 1000 mg via INTRAVENOUS

## 2010-11-24 SURGICAL SUPPLY — 39 items
APL SKNCLS STERI-STRIP NONHPOA (GAUZE/BANDAGES/DRESSINGS) ×1
APPLIER CLIP ROT 10 11.4 M/L (STAPLE) ×2
APR CLP MED LRG 11.4X10 (STAPLE) ×1
BAG SPEC RTRVL LRG 6X4 10 (ENDOMECHANICALS) ×1
BENZOIN TINCTURE PRP APPL 2/3 (GAUZE/BANDAGES/DRESSINGS) ×2 IMPLANT
CABLE HIGH FREQUENCY MONO STRZ (ELECTRODE) ×2 IMPLANT
CANISTER SUCTION 2500CC (MISCELLANEOUS) ×2 IMPLANT
CHLORAPREP W/TINT 10.5 ML (MISCELLANEOUS) ×2 IMPLANT
CLIP APPLIE ROT 10 11.4 M/L (STAPLE) ×1 IMPLANT
CLOTH BEACON ORANGE TIMEOUT ST (SAFETY) ×2 IMPLANT
COVER MAYO STAND STRL (DRAPES) ×2 IMPLANT
DECANTER SPIKE VIAL GLASS SM (MISCELLANEOUS) ×2 IMPLANT
DRAPE C-ARM 42X72 X-RAY (DRAPES) ×2 IMPLANT
DRAPE LAPAROSCOPIC ABDOMINAL (DRAPES) ×2 IMPLANT
ELECT REM PT RETURN 9FT ADLT (ELECTROSURGICAL) ×2
ELECTRODE REM PT RTRN 9FT ADLT (ELECTROSURGICAL) ×1 IMPLANT
FILTER SMOKE EVAC LAPAROSHD (FILTER) ×2 IMPLANT
GLOVE BIOGEL PI IND STRL 7.0 (GLOVE) ×1 IMPLANT
GLOVE BIOGEL PI INDICATOR 7.0 (GLOVE) ×1
GLOVE SURG ORTHO 8.0 STRL STRW (GLOVE) ×2 IMPLANT
GOWN STRL NON-REIN LRG LVL3 (GOWN DISPOSABLE) ×2 IMPLANT
GOWN STRL REIN XL XLG (GOWN DISPOSABLE) ×4 IMPLANT
HEMOSTAT SURGICEL 4X8 (HEMOSTASIS) IMPLANT
KIT BASIN OR (CUSTOM PROCEDURE TRAY) ×2 IMPLANT
NS IRRIG 1000ML POUR BTL (IV SOLUTION) ×2 IMPLANT
POUCH SPECIMEN RETRIEVAL 10MM (ENDOMECHANICALS) ×2 IMPLANT
SCISSORS LAP 5X35 DISP (ENDOMECHANICALS) ×2 IMPLANT
SET CHOLANGIOGRAPH MIX (MISCELLANEOUS) ×2 IMPLANT
SET IRRIG TUBING LAPAROSCOPIC (IRRIGATION / IRRIGATOR) ×2 IMPLANT
SLEEVE Z-THREAD 5X100MM (TROCAR) ×2 IMPLANT
SOLUTION ANTI FOG 6CC (MISCELLANEOUS) ×2 IMPLANT
STRIP CLOSURE SKIN 1/2X4 (GAUZE/BANDAGES/DRESSINGS) ×2 IMPLANT
SUT MNCRL AB 4-0 PS2 18 (SUTURE) ×2 IMPLANT
TOWEL OR 17X26 10 PK STRL BLUE (TOWEL DISPOSABLE) ×6 IMPLANT
TRAY LAP CHOLE (CUSTOM PROCEDURE TRAY) ×2 IMPLANT
TROCAR HASSON GELL 12X100 (TROCAR) ×2 IMPLANT
TROCAR Z-THREAD FIOS 11X100 BL (TROCAR) ×2 IMPLANT
TROCAR Z-THREAD FIOS 5X100MM (TROCAR) ×2 IMPLANT
TUBING INSUFFLATION 10FT LAP (TUBING) ×2 IMPLANT

## 2010-11-24 NOTE — Anesthesia Postprocedure Evaluation (Signed)
  Anesthesia Post-op Note  Patient: [name]  Procedure(s) Performed:  LAPAROSCOPIC CHOLECYSTECTOMY WITH INTRAOPERATIVE CHOLANGIOGRAM - c-arm  Patient Location: PACU  Anesthesia Type: General  Level of Consciousness: awake, alert  and oriented  Airway and Oxygen Therapy: Patient Spontanous Breathing and Patient connected to nasal cannula oxygen  Post-op Pain: mild  Post-op Assessment: Post-op Vital signs reviewed  Post-op Vital Signs: stable  Complications: No apparent anesthesia complications

## 2010-11-24 NOTE — Interval H&P Note (Deleted)
History and Physical Interval Note:   11/24/2010   9:13 AM   Sheilah Pigeon  has presented today for surgery, with the diagnosis of choledocholithiasis cholecystitis   The various methods of treatment have been discussed with the patient and family. After consideration of risks, benefits and other options for treatment, the patient has consented to  Procedure(s): LAPAROSCOPIC CHOLECYSTECTOMY WITH INTRAOPERATIVE CHOLANGIOGRAM as a surgical intervention .  The patients' history has been reviewed, patient examined, no change in status, stable for surgery.  I have reviewed the patients' chart and labs.  Questions were answered to the patient's satisfaction.     Velora Heckler  MD History and Physical Interval Note:  11/24/2010  7:13 AM  Patient has presented today for surgery, with the diagnosis of right inguinal hernia. The various methods of treatment have been discussed with the patient and family. After consideration of risks, benefits and other options for treatment, the patient has consented to Procedure(s): Lap Chole with IOC as a surgical intervention.   The patients' history has been reviewed, patient examined, no change in status, stable for surgery.  I have reviewed the patients' chart and labs.  Questions were answered to the patient's satisfaction.    Velora Heckler, MD, FACS General & Endocrine Surgery [facility]

## 2010-11-24 NOTE — H&P (Deleted)
Progress Notes   [name] (MR# [id number])       Progress Notes     Chief Complaint   Patient presents with   .  Other       est pt new prob- eval GB        HISTORY: Patient is a 77 year old male who developed nausea vomiting and chills in July 2012. Initial workup by his primary physician demonstrated abnormal liver function test. Patient subsequently underwent abdominal ultrasound and CT scan of the abdomen showing biliary dilatation. Patient had ERCP performed on August 18, 2010. He was found to have choledocholithiasis which was cleared. Patient is now referred for consideration for cholecystectomy.   Patient did experience acholic stools and dark urine during the summer months prior to his ERCP. He has no other history of hepatobiliary disease. He has been symptom-free since his procedure in late July. Patient does have a daughter who require cholecystectomy many years ago.      Past Medical History   Diagnosis  Date   .  Coronary atherosclerosis of unspecified type of vessel, native or graft     .  Personal history of colonic polyps         adenomatous   .  Hypertrophy of prostate without urinary obstruction and other lower urinary tract symptoms (LUTS)     .  Unspecified essential hypertension     .  Other and unspecified hyperlipidemia     .  Diverticulosis of colon (without mention of hemorrhage)     .  Unspecified sleep apnea     .  Degeneration of intervertebral disc, site unspecified     .  Cluster headache     .  Pernicious anemia     .  Neurogenic dysphagia     .  Osteoarthritis     .  CHF (congestive heart failure)             Current Outpatient Prescriptions   Medication  Sig  Dispense  Refill   .  aspirin 81 MG tablet  Take 81 mg by mouth daily.           Marland Kitchen  doxazosin (CARDURA) 8 MG tablet  Take 0.5 tablets (4 mg total) by mouth daily.   30 tablet   6   .  fexofenadine (ALLEGRA) 180 MG tablet  Take 1 tablet (180 mg total) by mouth daily.          .  metoprolol succinate (TOPROL-XL) 25 MG 24 hr tablet  Take by mouth daily. Take one half tablet daily          .  nitroGLYCERIN (NITROSTAT) 0.4 MG SL tablet  Place 0.4 mg under the tongue every 5 (five) minutes as needed. Up to 3 doses          .  Omega-3 Fatty Acids (FISH OIL) 1200 MG CAPS  Take 1 capsule by mouth daily.           Marland Kitchen  oxyCODONE-acetaminophen (PERCOCET) 5-325 MG per tablet  Take 1 tablet by mouth every 4 (four) hours as needed.           .  simvastatin (ZOCOR) 40 MG tablet  daily.                 Allergies   Allergen  Reactions   .  Penicillins     .  Sulfamethoxazole W/Trimethoprim     .  Sulfonamide Derivatives  Family History   Problem  Relation  Age of Onset   .  Colon cancer  Father     .  Cancer  Father         colon   .  Peripheral vascular disease  Mother             History       Social History   .  Marital Status:  Married       Spouse Name:  N/A       Number of Children:  2   .  Years of Education:  N/A       Occupational History   .  retired         Social History Main Topics   .  Smoking status:  Former Smoker       Types:  Pipe       Quit date:  01/18/1966   .  Smokeless tobacco:  Never Used   .  Alcohol Use:  No   .  Drug Use:  No   .  Sexually Active:  None       Other Topics  Concern   .  None       Social History Narrative   .  None          REVIEW OF SYSTEMS - PERTINENT POSITIVES ONLY: Positive for acholic stools, resolved. Positive for dark urine, resolved. Positive for nausea and vomiting, resolved.     EXAM: Filed Vitals:     10/05/10 0853   BP:  126/64   Pulse:  64   Temp:  97.8 F (36.6 C)        HEENT:           normocephalic; pupils equal and reactive; sclerae clear; dentition good; mucous membranes moist NECK:             ; symmetric on extension; no palpable anterior or posterior cervical lymphadenopathy; no supraclavicular masses; no tenderness CHEST:            clear to auscultation bilaterally without rales, rhonchi, or wheezes CARDIAC:       regular rate and rhythm without significant murmur; peripheral pulses are full ABDOMEN:     Abdomen is soft without distention. It is slightly protuberant. Right inguinal wound is well healed. No other sign of hernia. On palpation there is no tenderness. There is no hepatosplenomegaly. There are no masses. EXT:                non-tender without edema; no deformity NEURO:          no gross focal deficits; no sign of tremor     LABORATORY RESULTS: See E-Chart for most recent results     RADIOLOGY RESULTS: See E-Chart or I-Site for most recent results     IMPRESSION: #1 history of choledocholithiasis, status post ERCP #2 probable chronic cholecystitis, rule out cholelithiasis #3 pernicious anemia     PLAN: I had a lengthy discussion with the patient and his wife regarding cholecystectomy. I provided with written literature of the procedure. I believe he is an excellent candidate for laparoscopic cholecystectomy. We will plan for cholangiography at the time of surgery to assure ourselves that the common bile duct is normal without obstruction. We discussed the hospital stay to be anticipated. We discussed the possibility of open surgery. They understand and agree to proceed.   The risks and benefits of the procedure have been discussed at length  with the patient.  The patient understands the proposed procedure, potential alternative treatments, and the course of recovery to be expected.  All of the patient's questions have been answered at this time.  The patient wishes to proceed with surgery and will schedule a date for their procedure through our office staff.     Velora Heckler, MD, FACS General & Endocrine Surgery [facility]           Visit Diagnoses: 1.  Choledocholithiasis with chronic cholecystitis         Primary Care Physician: [name], MD,  MD           Progress Notes Info       Author Note Status Last Update User Last Update Date/Time    Velora Heckler, MD Signed Velora Heckler, MD 10/05/2010  9:30 AM

## 2010-11-24 NOTE — Anesthesia Preprocedure Evaluation (Addendum)
Anesthesia Evaluation  Patient identified by MRN, date of birth, ID band Patient awake    Reviewed: Allergy & Precautions, H&P , NPO status , Patient's Chart, lab work & pertinent test results, reviewed documented beta blocker date and time   History of Anesthesia Complications Negative for: history of anesthetic complications  Airway Mallampati: I TM Distance: >3 FB     Dental No notable dental hx. (+) Dental Advisory Given and Teeth Intact   Pulmonary sleep apnea ,  Noncompliant with cpap clear to auscultation  Pulmonary exam normal       Cardiovascular hypertension, Pt. on medications and Pt. on home beta blockers - angina+ CAD and +CHF Regular Normal    Neuro/Psych  Headaches, Negative Psych ROS   GI/Hepatic negative GI ROS, Neg liver ROS,   Endo/Other    Renal/GU negative Renal ROS  male genitourinary complaint BPH with nocturnal frequency    Musculoskeletal  (+) Arthritis -,   Abdominal Normal abdominal exam  (+)   Peds negative pediatric ROS (+)  Hematology negative hematology ROS (+)   Anesthesia Other Findings   Reproductive/Obstetrics negative OB ROS                           Anesthesia Physical Anesthesia Plan  ASA: II  Anesthesia Plan: General   Post-op Pain Management:    Induction: Intravenous  Airway Management Planned: Oral ETT  Additional Equipment:   Intra-op Plan:   Post-operative Plan: Extubation in OR  Informed Consent: I have reviewed the patients History and Physical, chart, labs and discussed the procedure including the risks, benefits and alternatives for the proposed anesthesia with the patient or authorized representative who has indicated his/her understanding and acceptance.   Dental advisory given  Plan Discussed with:   Anesthesia Plan Comments:        Anesthesia Quick Evaluation

## 2010-11-24 NOTE — Transfer of Care (Signed)
Immediate Anesthesia Transfer of Care Note  Patient: [name]  Procedure(s) Performed:  LAPAROSCOPIC CHOLECYSTECTOMY WITH INTRAOPERATIVE CHOLANGIOGRAM - c-arm  Patient Location: PACU  Anesthesia Type: General  Level of Consciousness: awake and sedated  Airway & Oxygen Therapy: Patient Spontanous Breathing and Patient connected to face mask oxygen  Post-op Assessment: Report given to PACU RN and Post -op Vital signs reviewed and stable  Post vital signs: Reviewed and stable  Complications: No apparent anesthesia complications`

## 2010-11-24 NOTE — Progress Notes (Signed)
Discharged from PACU to 1526.  Handoff to [employer]

## 2010-11-24 NOTE — H&P (Signed)
No chief complaint on file.   HISTORY: Patient is a 77 year old white male who initially presented in July 2012. He was found to have abnormal liver function tests. Abdominal ultrasound and CT scan showed biliary dilatation. Patient underwent ERCP on 08/18/2010 and was found to have choledocholithiasis. He had successful sphincterotomy and stone extraction. Patient was referred to general surgery. He was seen and evaluated at my office on 10/05/2010. He is scheduled at this time to undergo laparoscopic cholecystectomy with intraoperative cholangiography.   Past Medical History  Diagnosis Date  . Coronary atherosclerosis of unspecified type of vessel, native or graft   . Personal history of colonic polyps     adenomatous  . Hypertrophy of prostate without urinary obstruction and other lower urinary tract symptoms (LUTS)   . Unspecified essential hypertension   . Other and unspecified hyperlipidemia   . Diverticulosis of colon (without mention of hemorrhage)   . Degeneration of intervertebral disc, site unspecified   . Cluster headache   . Pernicious anemia   . Neurogenic dysphagia   . Osteoarthritis   . CHF (congestive heart failure)   . Unspecified sleep apnea     does not have cpap-was not able to tolerate  . Prostate enlargement     urinary frequency, nocturia  . Swallowing difficulty      Current Facility-Administered Medications  Medication Dose Route Frequency Provider Last Rate Last Dose  . metoprolol succinate (TOPROL-XL) 24 hr tablet 12.5 mg  12.5 mg Oral Once Einar Pheasant, MD       Facility-Administered Medications Ordered in Other Encounters  Medication Dose Route Frequency Provider Last Rate Last Dose  . acetaminophen (OFIRMEV) IVPB    PRN Elesa Massed   1,000 mg at 11/24/10 0908  . ciprofloxacin (CIPRO) IVPB    PRN Elesa Massed   400 mg at 11/24/10 0908  . lactated ringers infusion    Continuous PRN Elesa Massed         Allergies  Allergen Reactions   . Penicillins Other (See Comments)    Whelps, passed out  . Sulfamethoxazole W/Trimethoprim Rash  . Sulfonamide Derivatives Rash     Family History  Problem Relation Age of Onset  . Colon cancer Father   . Cancer Father     colon  . Peripheral vascular disease Mother      History   Social History  . Marital Status: Married    Spouse Name: N/A    Number of Children: 2  . Years of Education: N/A   Occupational History  . retired    Social History Main Topics  . Smoking status: Former Smoker    Types: Pipe    Quit date: 01/18/1966  . Smokeless tobacco: Never Used  . Alcohol Use: No  . Drug Use: No  . Sexually Active: None   Other Topics Concern  . None   Social History Narrative  . None     REVIEW OF SYSTEMS - PERTINENT POSITIVES ONLY: In the interval since his office visit, the patient denies any abdominal pain or other recurrent symptoms.   EXAM: Filed Vitals:   11/24/10 0745  BP: 128/72  Pulse: 73  Temp: 97.2 F (36.2 C)  Resp: 18    HEENT: normocephalic; pupils equal and reactive; sclerae clear; dentition good; mucous membranes moist NECK:  No palpable nodules; symmetric on extension; no palpable anterior or posterior cervical lymphadenopathy; no supraclavicular masses; no tenderness CHEST: clear to auscultation bilaterally without rales, rhonchi, or  wheezes CARDIAC: regular rate and rhythm without significant murmur; peripheral pulses are full ABDOMEN: Soft nontender without distention. No masses. EXT:  non-tender without edema; no deformity NEURO: no gross focal deficits; no sign of tremor   LABORATORY RESULTS: See E-Chart for most recent results   RADIOLOGY RESULTS: See E-Chart or I-Site for most recent results   IMPRESSION: #1 history of choledocholithiasis, status post ERCP with stone extraction #2 probable chronic cholecystitis, rule out cholelithiasis #3 pernicious anemia #4 thrombocytopenia   PLAN: The patient and I have  discussed all of the above findings. Along with his wife we have discussed laparoscopic cholecystectomy with intraoperative cholangiography. I have provided the patient with written ligature on the procedure. We plan for cholangiography at the time of surgery to assure ourselves that the common bile duct is normal without obstruction. Patient plans on an overnight hospital stay following his procedure. Risk and benefits of the procedure been discussed including the possibility of conversion to open surgery. Patient and his family understand and agree to proceed.  The risks and benefits of the procedure have been discussed at length with the patient.  The patient understands the proposed procedure, potential alternative treatments, and the course of recovery to be expected.  All of the patient's questions have been answered at this time.  The patient wishes to proceed with surgery and will schedule a date for their procedure through our office staff.  Velora Heckler, MD, FACS General & Endocrine Surgery [facility]    Visit Diagnoses: No diagnosis found.  Primary Care Physician: [name], MD, MD  Gastroenterology:  Lina Sar, MD.

## 2010-11-24 NOTE — Op Note (Signed)
Laparoscopic Cholecystectomy with IOC Procedure Note  Pre-operative Diagnosis: Calculus of bile duct with other cholecystitis, without mention of obstruction  Post-operative Diagnosis: Same  Surgeon:  Velora Heckler, MD, FACS  Assistant:  Consuello Bossier, MD, FACS   Anesthesia:  General  Indications: This patient presents with symptomatic gallbladder disease and will undergo laparoscopic cholecystectomy.  Procedure Details  The patient was seen again in the Holding Room. The risks, benefits, complications, treatment options, and expected outcomes were discussed with the patient. The patient and/or family concurred with the proposed plan, giving informed consent.  The patient was taken to Operating Room, identified as [name] and the procedure verified as Laparoscopic Cholecystectomy with Intraoperative Cholangiogram. A Time Out was held and the above information confirmed.  Prior to the induction of general anesthesia, antibiotic prophylaxis was administered. General endotracheal anesthesia was then administered and tolerated well. After the induction, the abdomen was prepped in the usual aseptic fashion. The patient was positioned in the supine position with some reverse Trendelenburg.  An incision was made in the skin near the umbilicus. The midline fascia was incised and the Hasson canula was introduced under direct vision. It was secured with a pursestring 0-Vicryl suture placed in the usual fashion. Pneumoperitoneum was then created with CO2 and tolerated well without any adverse changes in the patient's vital signs. Additional trocars were introduced under direct vision along the right costal margin in the midline, mid-clavicular line, and mid-axillary line.  The gallbladder was identified and the fundus grasped and retracted cephalad. Adhesions were lysed bluntly and with the electrocautery where needed, taking care not to injure any adjacent structures. The infundibulum was  grasped and retracted laterally, exposing the peritoneum overlying the triangle of Calot. This was then divided and exposed in a blunt fashion. The cystic duct was clearly identified and bluntly dissected circumferentially.  An incision was made in the cystic duct and the cholangiogram catheter introduced. The catheter was secured using an ligaclip.  Real-time cholangiography was performed using the C-arm.  There was rapid filling of a normal caliber common bile duct.  There was reflux of contrast into the left and right hepatic ductal systems.  There was free flow distally into the duodenum without filling defect or obstruction.  Catheter was removed from the peritoneal cavity.  The cystic duct was then triply ligated with surgical clips on the patient side and singly clipped on the gallbladder side and divided. The cystic artery was identified, dissected free, ligated with clips and divided.   The gallbladder was dissected from the liver bed with the electrocautery used for hemostasis. The gallbladder was completely removed and placed into an endocatch bag. The right upper quadrant was irrigated and inspected. Hemostasis was achieved with the electrocautery. Copious irrigation was utilized and was repeatedly aspirated until clear.  Pneumoperitoneum was released after viewing removal of the trocars with good hemostasis. The umbilical wound was irrigated and the fascia was then closed with the pursestring suture; the skin was then closed with 4-0 Monocril subcuticular sutures and a sterile dressing was applied.  Instrument, sponge, and needle counts were correct at closure and at the conclusion of the case.   Findings: Cholecystitis without Cholelithiasis  Estimated Blood Loss: Minimal         Drains: None         Specimens: Gallbladder           Complications: None; patient tolerated the procedure well.         Disposition: PACU -  hemodynamically stable.         Condition: stable

## 2010-11-25 LAB — CBC
HCT: 37.7 % — ABNORMAL LOW (ref 39.0–52.0)
Hemoglobin: 12.2 g/dL — ABNORMAL LOW (ref 13.0–17.0)
WBC: 9 10*3/uL (ref 4.0–10.5)

## 2010-11-25 MED ORDER — HYDROCODONE-ACETAMINOPHEN 5-325 MG PO TABS: 1.0000 | ORAL_TABLET | ORAL | Status: AC | PRN

## 2010-11-25 NOTE — Progress Notes (Signed)
   LOS: 1 day   Subjective: Patient is comfortable, no nausea.  Ambulated.  Mild pain.  Objective: Vital signs in last 24 hours: Temp:  [97.5 F (36.4 C)-98.9 F (37.2 C)] 98.7 F (37.1 C) (11/07 0640) Pulse Rate:  [59-90] 87  (11/07 0640) Resp:  [10-18] 18  (11/07 0640) BP: (109-184)/(62-110) 109/64 mmHg (11/07 0640) SpO2:  [92 %-100 %] 94 % (11/07 0640) Last BM Date: 11/23/10  Intake/Output from previous day: 11/06 0701 - 11/07 0700 In: 4000 [P.O.:960; I.V.:3040] Out: 2800 [Urine:2750; Blood:50]  Exam: Abdomen soft without distension.  Dressing dry.  Positive bowel sounds.  Lab Results:   Basename 11/25/10 0437 11/24/10 0745  WBC 9.0 4.7  HGB 12.2* 13.8  HCT 37.7* 41.6  PLT 63* 70*    No results found for this basename: NA:2,K:2,CL:2,CO2:2,GLUCOSE:2,BUN:2,CREATININE:2,CALCIUM:2 in the last 72 hours  Studies/Results: Dg Cholangiogram Operative  11/24/2010  *RADIOLOGY REPORT*  Clinical Data:   Cholecystectomy  INTRAOPERATIVE CHOLANGIOGRAM  Technique:  Cholangiographic images from the C-arm fluoroscopic device were submitted for interpretation post-operatively.  Please see the procedural report for the amount of contrast and the fluoroscopy time utilized.  Comparison:  ERCP - 08/18/2010; abdominal CT - 08/12/2010  Findings:  Multiple angiographic images of the right upper abdominal quadrant are provided for review.  Additional images demonstrate multiple clips overlying the location of the neck of the gallbladder.  Injection of contrast via the mid/peripheral aspect of the cystic duct demonstrates brisk passes of contrast through the cystic duct and through a mildly dilated common hepatic duct with opacification of the descending duodenum.  There is reflux of contrast into a minimally dilated intrahepatic biliary ducts.  No discrete filling defects are seen within the opacified portion of the cystic duct, common or proper hepatic ducts. There is no opacification of the pancreatic  duct.  IMPRESSION: Intraoperative cholangiogram as above.  No evidence of choleldochoithiasis.  Original Report Authenticated By: Waynard Reeds, M.D.    Anti-infectives    None      Assessment: Status post lap chole.  Doing well post op.  Plan: D/C IVF Discharge home.   Velora Heckler, MD, FACS General & Endocrine Surgery [facility]  11/25/2010

## 2010-11-25 NOTE — Progress Notes (Signed)
Quick Note:  Please contact patient with benign path results. TMG ______ 

## 2010-11-25 NOTE — Discharge Summary (Signed)
Physician Discharge Summary  Patient ID: [name] MRN: [id number] DOB/AGE: [birth year] 77 y.o.  Admit date: 11/24/2010 Discharge date: 11/25/2010  Admission Diagnoses:  Cholecystitis   Discharge Diagnoses: same Principal Problem:  *Choledocholithiasis with chronic cholecystitis Active Problems:  ANEMIA, PERNICIOUS   Discharged Condition: good  Hospital Course: Patient admitted for lap chole.  Underwent procedure without complication.  Post op course stable.  Prepared for discharge on first post op day.  Consults: none  Significant Diagnostic Studies: labs: CBC, cholagiogram  Treatments: IV hydration, lap chole with IOC   Discharge Exam: Blood pressure 109/64, pulse 87, temperature 98.7 F (37.1 C), temperature source Oral, resp. rate 18, SpO2 94.00%. Abdomen soft without distension.  Dressings dry.  Mild tender.  Disposition: Home or Self Care  Discharge Orders    Future Appointments: Provider: Department: Dept Phone: Center:   12/09/2010 11:15 AM Velora Heckler, MD [facility] [phone] None     Future Orders Please Complete By Expires   Diet - low sodium heart healthy      Increase activity slowly      Discharge instructions      Comments:   [facility] LAPAROSCOPIC SURGERY: POST OP INSTRUCTIONS Always review your discharge instruction sheet given to you by the facility where your surgery was performed. IF YOU HAVE DISABILITY OR FAMILY LEAVE FORMS, YOU MUST BRING THEM TO THE OFFICE FOR PROCESSING.   DO NOT GIVE THEM TO YOUR DOCTOR.  A prescription for pain medication may be given to you upon discharge.  Take your pain medication as prescribed, if needed.  If narcotic pain medicine is not needed, then you may take acetaminophen (Tylenol) or ibuprofen (Advil) as needed. Take your usually prescribed medications unless otherwise directed. If you need a refill on your pain medication, please contact your pharmacy.  They will  contact our office to request authorization. Prescriptions will not be filled after 5pm or on week-ends. You should follow a light diet the first few days after arrival home, such as soup and crackers, etc.  Be sure to include lots of fluids daily. Most patients will experience some swelling and bruising in the area of the incisions.  Ice packs will help.  Swelling and bruising can take several days to resolve.  It is common to experience some constipation if taking pain medication after surgery.  Increasing fluid intake and taking a stool softener (such as Colace) will usually help or prevent this problem from occurring.  A mild laxative (Milk of Magnesia or Miralax) should be taken according to package instructions if there are no bowel movements after 48 hours. Unless discharge instructions indicate otherwise, you may remove your bandages 24-48 hours after surgery, and you may shower at that time.  You may have steri-strips (small skin tapes) in place directly over the incision.  These strips should be left on the skin for 7-10 days.  If your surgeon used skin glue on the incision, you may shower in 24 hours.  The glue will flake off over the next 2-3 weeks.  Any sutures or staples will be removed at the office during your follow-up visit. ACTIVITIES:  You may resume regular (light) daily activities beginning the next day-such as daily self-care, walking, climbing stairs-gradually increasing activities as tolerated.  You may have sexual intercourse when it is comfortable.  Refrain from any heavy lifting or straining until approved by your doctor. You may drive when you are no longer taking prescription pain medication, you can  comfortably wear a seatbelt, and you can safely maneuver your car and apply brakes. RETURN TO WORK:  __________________________________________________________ Bonita Quin should see your doctor in the office for a follow-up appointment approximately 2-3 weeks after your surgery.  Make sure  that you call for this appointment within a day or two after you arrive home to insure a convenient appointment time. OTHER INSTRUCTIONS: __________________________________________________________________________________________________________________________ __________________________________________________________________________________________________________________________ WHEN TO CALL YOUR DOCTOR: Fever over 101.0 Inability to urinate Continued bleeding from incision. Increased pain, redness, or drainage from the incision. Increasing abdominal pain  The clinic staff is available to answer your questions during regular business hours.  Please don't hesitate to call and ask to speak to one of the nurses for clinical concerns.  If you have a medical emergency, go to the nearest emergency room or call 911.  A surgeon from [facility] is always on call at the hospital. [address], [place], [place]  [address] ? [address] [address], [place], [place]   [address] [phone] ? [phone] ? FAX [phone] Web site: [email or web address]   Remove dressing in 24 hours        Current Discharge Medication List    START taking these medications   Details  HYDROcodone-acetaminophen (NORCO) 5-325 MG per tablet Take 1-2 tablets by mouth every 4 (four) hours as needed. Qty: 30 tablet, Refills: 0      CONTINUE these medications which have NOT CHANGED   Details  acetaminophen (TYLENOL) 500 MG tablet Take 500-1,000 mg by mouth every 6 (six) hours as needed. For pain     aspirin 81 MG tablet Take 81 mg by mouth every morning.     cyanocobalamin (,VITAMIN B-12,) 1000 MCG/ML injection Inject 1,000 mcg into the muscle every 30 (thirty) days.     doxazosin (CARDURA) 4 MG tablet Take 2 mg by mouth every morning.     metoprolol succinate (TOPROL-XL) 25 MG 24 hr tablet Take 12.5 mg by mouth at bedtime. Take one half tablet daily    nitroGLYCERIN (NITROSTAT)  0.4 MG SL tablet Place 0.4 mg under the tongue every 5 (five) minutes as needed. Up to 3 doses    Omega-3 Fatty Acids (FISH OIL) 1200 MG CAPS Take 1 capsule by mouth daily.     simvastatin (ZOCOR) 40 MG tablet Take 40 mg by mouth at bedtime.     fexofenadine (ALLEGRA) 180 MG tablet Take 1 tablet (180 mg total) by mouth daily.       Follow-up Information    Call Velora Heckler, MD. (follow up in 2 weeks)    Contact information:   [facility] [address] [place] [address] [phone]          Signed: Velora Heckler 11/25/2010, 7:54 AM

## 2010-11-29 ENCOUNTER — Encounter (HOSPITAL_COMMUNITY): Payer: Self-pay | Admitting: Surgery

## 2010-12-01 NOTE — Progress Notes (Signed)
Patient aware.

## 2010-12-09 ENCOUNTER — Encounter (INDEPENDENT_AMBULATORY_CARE_PROVIDER_SITE_OTHER): Payer: Medicare Other | Admitting: Surgery

## 2010-12-09 ENCOUNTER — Encounter (INDEPENDENT_AMBULATORY_CARE_PROVIDER_SITE_OTHER): Payer: Self-pay | Admitting: Surgery

## 2010-12-09 ENCOUNTER — Ambulatory Visit (INDEPENDENT_AMBULATORY_CARE_PROVIDER_SITE_OTHER): Payer: Medicare Other | Admitting: Surgery

## 2010-12-09 VITALS — BP 138/76 | HR 70 | Temp 98.1°F | Resp 16 | Ht 63.0 in | Wt 158.4 lb

## 2010-12-09 DIAGNOSIS — K8044 Calculus of bile duct with chronic cholecystitis without obstruction: Secondary | ICD-10-CM

## 2010-12-09 DIAGNOSIS — K8040 Calculus of bile duct with cholecystitis, unspecified, without obstruction: Secondary | ICD-10-CM

## 2010-12-09 NOTE — Progress Notes (Signed)
Visit Diagnoses: 1. Choledocholithiasis with chronic cholecystitis     HISTORY: Patient returns for her first postoperative visit having undergone laparoscopic cholecystectomy.  EXAM: Abdomen soft nontender without distention. Surgical wounds are well healed. No sign of herniation. No sign of infection. Right upper quadrant is soft and nontender.  IMPRESSION: Status post laparoscopic cholecystectomy for chronic cholecystitis and history of choledocholithiasis.  PLAN: Patient will begin applying topical creams to his incisions. He will restrict his lifting for the next 2 weeks. He will then resume normal activity without restriction.  Patient will return as needed.   Velora Heckler, MD, FACS General & Endocrine Surgery [facility]

## 2010-12-09 NOTE — Patient Instructions (Signed)
  COCOA BUTTER & VITAMIN E CREAM  ([employer] or other brand)  Apply cocoa butter/vitamin E cream to your incision 2 - 3 times daily.  Massage cream into incision for one minute with each application.  Use sunscreen (50 SPF or higher) for first 6 months after surgery.  You may substitute Mederma or other scar reducing creams as desired.   

## 2010-12-21 ENCOUNTER — Ambulatory Visit (INDEPENDENT_AMBULATORY_CARE_PROVIDER_SITE_OTHER): Payer: Medicare Other | Admitting: *Deleted

## 2010-12-21 DIAGNOSIS — E538 Deficiency of other specified B group vitamins: Secondary | ICD-10-CM

## 2010-12-21 MED ORDER — CYANOCOBALAMIN 1000 MCG/ML IJ SOLN
1000.0000 ug | Freq: Once | INTRAMUSCULAR | Status: AC
  Administered 2010-12-21: 1000 ug via INTRAMUSCULAR

## 2011-01-04 ENCOUNTER — Other Ambulatory Visit: Payer: Self-pay | Admitting: *Deleted

## 2011-01-04 MED ORDER — SIMVASTATIN 40 MG PO TABS: 40.0000 mg | ORAL_TABLET | Freq: Every day | ORAL | Status: DC

## 2011-01-25 ENCOUNTER — Other Ambulatory Visit: Payer: Self-pay | Admitting: Cardiology

## 2011-01-25 MED ORDER — NITROGLYCERIN 0.4 MG SL SUBL: 0.4000 mg | SUBLINGUAL_TABLET | SUBLINGUAL | Status: DC | PRN

## 2011-01-28 ENCOUNTER — Ambulatory Visit (INDEPENDENT_AMBULATORY_CARE_PROVIDER_SITE_OTHER): Payer: Medicare Other

## 2011-01-28 DIAGNOSIS — E538 Deficiency of other specified B group vitamins: Secondary | ICD-10-CM

## 2011-01-28 MED ORDER — CYANOCOBALAMIN 1000 MCG/ML IJ SOLN
1000.0000 ug | Freq: Once | INTRAMUSCULAR | Status: AC
  Administered 2011-01-28: 1000 ug via INTRAMUSCULAR

## 2011-02-06 ENCOUNTER — Telehealth: Payer: Self-pay | Admitting: Oncology

## 2011-02-06 NOTE — Telephone Encounter (Signed)
Talked to pt, gave him appt for 03/08/11 lab and see ML, pt aware of appt.

## 2011-02-22 ENCOUNTER — Encounter: Payer: Self-pay | Admitting: Family Medicine

## 2011-02-22 ENCOUNTER — Ambulatory Visit (INDEPENDENT_AMBULATORY_CARE_PROVIDER_SITE_OTHER): Payer: Medicare Other | Admitting: Family Medicine

## 2011-02-22 VITALS — BP 130/72 | HR 81 | Temp 98.1°F | Ht 60.0 in | Wt 155.0 lb

## 2011-02-22 DIAGNOSIS — J069 Acute upper respiratory infection, unspecified: Secondary | ICD-10-CM

## 2011-02-22 NOTE — Progress Notes (Signed)
  Patient Name: [name] Date of Birth: 1933/01/31 Age: 78 y.o. Medical Record Number: [id number] Gender: male Date of Encounter: 02/22/2011  History of Present Illness:  [name] is a 78 y.o. very pleasant male patient who presents with the following:  URI: Drainage, sickness Tessalon and mucinex.   Feels like he is getting better and coughing. Will cough up a little bit. He feels like he has been improving over the last day or 2. He is afebrile. His wife wanted him to become evaluated. He is having some nasal drainage and some coughing. No significant sore throat, earache, nauseousness, myalgias.   Past Medical History, Surgical History, Social History, Family History, Problem List, Medications, and Allergies have been reviewed and updated if relevant.  Review of Systems: ROS: GEN: Acute illness details above GI: Tolerating PO intake GU: maintaining adequate hydration and urination Pulm: No SOB Interactive and getting along well at home.  Otherwise, ROS is as per the HPI.   Physical Examination: Filed Vitals:   02/22/11 1148  BP: 130/72  Pulse: 81  Temp: 98.1 F (36.7 C)  TempSrc: Oral  Height: 5' (1.524 m)  Weight: 155 lb (70.308 kg)  SpO2: 97%    Body mass index is 30.27 kg/(m^2).   Gen: WDWN, NAD; A & O x3, cooperative. Pleasant.Globally Non-toxic HEENT: Normocephalic and atraumatic. Throat clear, w/o exudate, R TM clear, L TM - good landmarks, No fluid present. rhinnorhea.  MMM Frontal sinuses: NT Max sinuses: NT NECK: Anterior cervical  LAD is absent CV: RRR, No M/G/R, cap refill <2 sec PULM: Breathing comfortably in no respiratory distress. no wheezing, crackles, rhonchi EXT: No c/c/e PSYCH: Friendly, good eye contact MSK: Nml gait    Assessment and Plan:  URI I discussed upper respiratory tract infections with the patient and explained viral infections in general.  Recommended plenty of sleep. Symptomatic care with pushing  fluids. Oral acetaminophen or NSAIDs as tolerated for body aches, chills, fevers.  follow-up if acutely worsens

## 2011-03-04 ENCOUNTER — Ambulatory Visit (INDEPENDENT_AMBULATORY_CARE_PROVIDER_SITE_OTHER): Payer: Medicare Other

## 2011-03-04 DIAGNOSIS — E538 Deficiency of other specified B group vitamins: Secondary | ICD-10-CM

## 2011-03-04 MED ORDER — CYANOCOBALAMIN 1000 MCG/ML IJ SOLN
1000.0000 ug | Freq: Once | INTRAMUSCULAR | Status: AC
  Administered 2011-03-04: 1000 ug via INTRAMUSCULAR

## 2011-03-08 ENCOUNTER — Other Ambulatory Visit (HOSPITAL_BASED_OUTPATIENT_CLINIC_OR_DEPARTMENT_OTHER): Payer: Medicare Other

## 2011-03-08 ENCOUNTER — Ambulatory Visit (HOSPITAL_BASED_OUTPATIENT_CLINIC_OR_DEPARTMENT_OTHER): Payer: Medicare Other | Admitting: Nurse Practitioner

## 2011-03-08 VITALS — BP 128/69 | HR 64 | Temp 99.3°F | Ht 60.0 in | Wt 154.3 lb

## 2011-03-08 DIAGNOSIS — D696 Thrombocytopenia, unspecified: Secondary | ICD-10-CM

## 2011-03-08 DIAGNOSIS — E538 Deficiency of other specified B group vitamins: Secondary | ICD-10-CM

## 2011-03-08 DIAGNOSIS — D510 Vitamin B12 deficiency anemia due to intrinsic factor deficiency: Secondary | ICD-10-CM

## 2011-03-08 DIAGNOSIS — D72819 Decreased white blood cell count, unspecified: Secondary | ICD-10-CM

## 2011-03-08 LAB — CBC WITH DIFFERENTIAL/PLATELET
Eosinophils Absolute: 0.1 10*3/uL (ref 0.0–0.5)
HCT: 41.7 % (ref 38.4–49.9)
LYMPH%: 22.7 % (ref 14.0–49.0)
MONO#: 1 10*3/uL — ABNORMAL HIGH (ref 0.1–0.9)
NEUT#: 2.3 10*3/uL (ref 1.5–6.5)
NEUT%: 52.4 % (ref 39.0–75.0)
Platelets: 67 10*3/uL — ABNORMAL LOW (ref 140–400)
RBC: 5.28 10*6/uL (ref 4.20–5.82)
WBC: 4.4 10*3/uL (ref 4.0–10.3)
lymph#: 1 10*3/uL (ref 0.9–3.3)
nRBC: 0 % (ref 0–0)

## 2011-03-08 LAB — MORPHOLOGY: PLT EST: DECREASED

## 2011-03-08 LAB — CHCC SMEAR

## 2011-03-08 NOTE — Progress Notes (Signed)
OFFICE PROGRESS NOTE  Interval history:  [name] is a 78 year old man with pernicious anemia. He is maintained on monthly B12 injections. He has a persistent mild leukopenia and thrombocytopenia with monocytosis. Dr. Cyndie Chime feels the findings are most compatible with a low-risk myelodysplastic syndrome. He is seen today for scheduled followup.  [name] reports that he feels well. Since undergoing a cholecystectomy in November 2012 he has noted improvement in his appetite and weight gain. He denies bleeding. He notes bruising with minor trauma. He denies spontaneous bruising.  Approximately 4 weeks ago while buying some car parts he had an episode of chest pain. He took a nitroglycerin tablet with resolution of the pain.   Objective: Blood pressure 128/69, pulse 64, temperature 99.3 F (37.4 C), temperature source Oral, height 5' (1.524 m), weight 154 lb 4.8 oz (69.99 kg).  Oropharynx is without thrush or ulceration. No palpable cervical, supra-clavicular or axillary lymph nodes. Lungs are clear. No wheezes or rales. Regular cardiac rhythm. No murmur. Abdomen is soft and nontender. No organomegaly. Extremities are without edema. Calves are soft and nontender.  Lab Results: Lab Results  Component Value Date   WBC 4.4 03/08/2011   HGB 14.1 03/08/2011   HCT 41.7 03/08/2011   MCV 79.0* 03/08/2011   PLT 67* 03/08/2011    Chemistry:    Chemistry      Component Value Date/Time   NA 142 11/18/2010 1135   K 4.7 11/18/2010 1135   CL 106 11/18/2010 1135   CO2 28 11/18/2010 1135   BUN 9 11/18/2010 1135   CREATININE 1.00 11/18/2010 1135      Component Value Date/Time   CALCIUM 9.7 11/18/2010 1135   ALKPHOS 88 11/18/2010 1135   AST 24 11/18/2010 1135   ALT 19 11/18/2010 1135   BILITOT 0.4 11/18/2010 1135       Studies/Results: No results found.  Medications: I have reviewed the patient's current medications.  Assessment/Plan:  1. Pernicious anemia, he continues monthly  B12 injections.  2. Persistent mild leukopenia and thrombocytopenia with monocytosis.  Nonspecific bone marrow findings except for hypercellularity and peripheral blood monocytosis. Dr. Cyndie Chime feels the findings are most compatible with a low-risk myelodysplastic syndrome. 3. Degenerative arthritis. 4. Symptomatic gallbladder disease status post cholecystectomy in November 2012. 5. Episode of chest pain approximately 4 weeks ago relieved with nitroglycerin. I encouraged him to contact Dr. Jens Som with further chest pain.  Disposition-[name] remains stable from a hematologic standpoint. He will return for a followup visit in 6 months. He will contact the office the interim with any problems.  Plan reviewed with Dr. Cyndie Chime.  [name] ANP/GNP-BC

## 2011-03-18 ENCOUNTER — Encounter: Payer: Self-pay | Admitting: *Deleted

## 2011-03-18 ENCOUNTER — Ambulatory Visit (INDEPENDENT_AMBULATORY_CARE_PROVIDER_SITE_OTHER): Payer: Medicare Other | Admitting: Cardiology

## 2011-03-18 ENCOUNTER — Encounter: Payer: Self-pay | Admitting: Cardiology

## 2011-03-18 DIAGNOSIS — E785 Hyperlipidemia, unspecified: Secondary | ICD-10-CM

## 2011-03-18 DIAGNOSIS — I2510 Atherosclerotic heart disease of native coronary artery without angina pectoris: Secondary | ICD-10-CM

## 2011-03-18 DIAGNOSIS — I10 Essential (primary) hypertension: Secondary | ICD-10-CM

## 2011-03-18 DIAGNOSIS — E7800 Pure hypercholesterolemia, unspecified: Secondary | ICD-10-CM

## 2011-03-18 LAB — HEPATIC FUNCTION PANEL
ALT: 16 U/L (ref 0–53)
AST: 19 U/L (ref 0–37)
Alkaline Phosphatase: 73 U/L (ref 39–117)
Bilirubin, Direct: 0.2 mg/dL (ref 0.0–0.3)
Total Bilirubin: 0.8 mg/dL (ref 0.3–1.2)

## 2011-03-18 LAB — LIPID PANEL
HDL: 56 mg/dL (ref >39.00)
Total CHOL/HDL Ratio: 2

## 2011-03-18 NOTE — Assessment & Plan Note (Signed)
Blood pressure controlled. Continue present medications. 

## 2011-03-18 NOTE — Patient Instructions (Signed)
Your physician wants you to follow-up in: ONE YEAR  You will receive a reminder letter in the mail two months in advance. If you don't receive a letter, please call our office to schedule the follow-up appointment.   Your physician recommends that you return for lab work in: TODAY  

## 2011-03-18 NOTE — Assessment & Plan Note (Signed)
Continue aspirin and statin. Plan Myoview when he returns in one year. 

## 2011-03-18 NOTE — Progress Notes (Signed)
HPI: Mr. Wageman is a pleasant gentleman who has a history of coronary artery disease by cardiac catheterization in 2000.  At that time he had a 60-70% proximal LAD, 70-80% stenosis in a small intermediate and a 20% stenosis in the mid right coronary artery. His LV function was normal.  His most recent Myoview was performed in April of 2011.  At that time, his ejection fraction was 85% and there was apical thinning but no ischemia or infarction. Patient last seen in Jan 2012. Since then, the patient has dyspnea with more extreme activities but not with routine activities. It is relieved with rest. It is not associated with chest pain. There is no orthopnea, PND or pedal edema. There is no syncope or palpitations. There is no exertional chest pain.   Current Outpatient Prescriptions  Medication Sig Dispense Refill  . aspirin 81 MG tablet Take 81 mg by mouth every morning.       . cyanocobalamin (,VITAMIN B-12,) 1000 MCG/ML injection Inject 1,000 mcg into the muscle every 30 (thirty) days.       Marland Kitchen doxazosin (CARDURA) 4 MG tablet Take 2 mg by mouth every morning.       . metoprolol succinate (TOPROL-XL) 25 MG 24 hr tablet Take 12.5 mg by mouth at bedtime. Take one half tablet daily      . nitroGLYCERIN (NITROSTAT) 0.4 MG SL tablet Place 1 tablet (0.4 mg total) under the tongue every 5 (five) minutes as needed. Up to 3 doses  25 tablet  11  . Omega-3 Fatty Acids (FISH OIL) 1200 MG CAPS Take 1 capsule by mouth daily.       . simvastatin (ZOCOR) 40 MG tablet Take 1 tablet (40 mg total) by mouth at bedtime.  30 tablet  12     Past Medical History  Diagnosis Date  . Coronary atherosclerosis of unspecified type of vessel, native or graft   . Personal history of colonic polyps     adenomatous  . Hypertrophy of prostate without urinary obstruction and other lower urinary tract symptoms (LUTS)   . Unspecified essential hypertension   . Other and unspecified hyperlipidemia   . Diverticulosis of colon  (without mention of hemorrhage)   . Degeneration of intervertebral disc, site unspecified   . Cluster headache   . Pernicious anemia   . Neurogenic dysphagia   . Osteoarthritis   . CHF (congestive heart failure)   . Unspecified sleep apnea     does not have cpap-was not able to tolerate  . Prostate enlargement     urinary frequency, nocturia  . Swallowing difficulty     Past Surgical History  Procedure Date  . Inguinal herniorrhapy 2003  . Uvulectomy 1993  . Knee arthroscopy     5 times  . Total knee arthroplasty 2003  . Rotator cuff repair 2006  . Biopsy prostate 2008    Benign  . Tonsillectomy   . Total hip arthroplasty   . Hernia repair 2004    RIH  . Bone marrow biopsy 2011  . Nasal endoscopy 2012  . Joint replacement     both knees and right hip  . Rt rotator cuff repair 2006   . Cholecystectomy 11/24/2010    Procedure: LAPAROSCOPIC CHOLECYSTECTOMY WITH INTRAOPERATIVE CHOLANGIOGRAM;  Surgeon: Velora Heckler, MD;  Location: [facility];  Service: General;  Laterality: N/A;  c-arm    History   Social History  . Marital Status: Married    Spouse Name: N/A  Number of Children: 2  . Years of Education: N/A   Occupational History  . retired    Social History Main Topics  . Smoking status: Former Smoker    Types: Pipe    Quit date: 01/18/1966  . Smokeless tobacco: Never Used  . Alcohol Use: No  . Drug Use: No  . Sexually Active: Not on file   Other Topics Concern  . Not on file   Social History Narrative  . No narrative on file    ROS: Arthralgias but no fevers or chills, productive cough, hemoptysis, dysphasia, odynophagia, melena, hematochezia, dysuria, hematuria, rash, seizure activity, orthopnea, PND, pedal edema, claudication. Remaining systems are negative.  Physical Exam: Well-developed well-nourished in no acute distress.  Skin is warm and dry.  HEENT is normal.  Neck is supple. No thyromegaly.  Chest is clear to auscultation with normal  expansion.  Cardiovascular exam is regular rate and rhythm.  Abdominal exam nontender or distended. No masses palpated. Extremities show no edema. neuro grossly intact  ECG sinus rhythm at a rate of 86. Left axis deviation. No significant ST changes.

## 2011-03-18 NOTE — Assessment & Plan Note (Signed)
Continue statin. Check lipids and liver. 

## 2011-04-20 ENCOUNTER — Ambulatory Visit (INDEPENDENT_AMBULATORY_CARE_PROVIDER_SITE_OTHER): Payer: Medicare Other

## 2011-04-20 DIAGNOSIS — E538 Deficiency of other specified B group vitamins: Secondary | ICD-10-CM

## 2011-04-20 MED ORDER — CYANOCOBALAMIN 1000 MCG/ML IJ SOLN
1000.0000 ug | Freq: Once | INTRAMUSCULAR | Status: AC
  Administered 2011-04-20: 1000 ug via INTRAMUSCULAR

## 2011-04-30 ENCOUNTER — Other Ambulatory Visit: Payer: Self-pay | Admitting: Family Medicine

## 2011-04-30 ENCOUNTER — Other Ambulatory Visit (INDEPENDENT_AMBULATORY_CARE_PROVIDER_SITE_OTHER): Payer: Medicare Other

## 2011-04-30 DIAGNOSIS — N400 Enlarged prostate without lower urinary tract symptoms: Secondary | ICD-10-CM

## 2011-04-30 DIAGNOSIS — E785 Hyperlipidemia, unspecified: Secondary | ICD-10-CM

## 2011-04-30 DIAGNOSIS — I10 Essential (primary) hypertension: Secondary | ICD-10-CM

## 2011-04-30 LAB — COMPREHENSIVE METABOLIC PANEL
ALT: 16 U/L (ref 0–53)
AST: 21 U/L (ref 0–37)
CO2: 28 mEq/L (ref 19–32)
Calcium: 8.8 mg/dL (ref 8.4–10.5)
Chloride: 107 mEq/L (ref 96–112)
GFR: 86.19 mL/min (ref >60.00)
Potassium: 3.9 mEq/L (ref 3.5–5.1)
Sodium: 141 mEq/L (ref 135–145)
Total Protein: 6 g/dL (ref 6.0–8.3)

## 2011-04-30 LAB — PSA: PSA: 3.74 ng/mL (ref 0.10–4.00)

## 2011-04-30 LAB — LIPID PANEL: Total CHOL/HDL Ratio: 3

## 2011-05-05 ENCOUNTER — Ambulatory Visit (INDEPENDENT_AMBULATORY_CARE_PROVIDER_SITE_OTHER): Payer: Medicare Other | Admitting: Family Medicine

## 2011-05-05 ENCOUNTER — Encounter: Payer: Self-pay | Admitting: Family Medicine

## 2011-05-05 VITALS — BP 140/80 | HR 80 | Temp 97.8°F | Ht 61.25 in | Wt 155.0 lb

## 2011-05-05 DIAGNOSIS — Z23 Encounter for immunization: Secondary | ICD-10-CM

## 2011-05-05 DIAGNOSIS — Z0000 Encounter for general adult medical examination without abnormal findings: Secondary | ICD-10-CM

## 2011-05-05 DIAGNOSIS — I10 Essential (primary) hypertension: Secondary | ICD-10-CM

## 2011-05-05 DIAGNOSIS — N400 Enlarged prostate without lower urinary tract symptoms: Secondary | ICD-10-CM

## 2011-05-05 DIAGNOSIS — E785 Hyperlipidemia, unspecified: Secondary | ICD-10-CM

## 2011-05-05 DIAGNOSIS — D510 Vitamin B12 deficiency anemia due to intrinsic factor deficiency: Secondary | ICD-10-CM

## 2011-05-05 MED ORDER — SIMVASTATIN 40 MG PO TABS: 20.0000 mg | ORAL_TABLET | Freq: Every day | ORAL | Status: DC

## 2011-05-05 NOTE — Progress Notes (Signed)
78 yo here for AMW.  I have personally reviewed the Medicare Annual Wellness questionnaire and have noted 1. The patient's medical and social history 2. Their use of alcohol, tobacco or illicit drugs 3. Their current medications and supplements 4. The patient's functional ability including ADL's, fall risks, home safety risks and hearing or visual             impairment. 5. Diet and physical activities 6. Evidence for depression or mood disorders  FBS elevated- 119 (was normal 5 months ago)- drank orange juice before having labs done that morning.   h/o pernicious anemia- has follow up with Dr. Geanie Cooley next month. Still feels great. Due for B12 shot today.  Lab Results  Component Value Date   VITAMINB12 >1500* 07/23/2010     Elevated PSA- followed by Dr. Patsi Sears, improved from last year.  Lab Results  Component Value Date   PSA 3.74 04/30/2011   PSA 5.62* 08/03/2010   PSA 4.15* 12/15/2009   CAD- followed by Dr.  Jens Som. H/o cardiac catheterization in 2000. His most recent Myoview was performed in April of 2011. At that time, his ejection fraction was 85% and there was apical thinning but no ischemia or infarction.  No CP or SOB.  HLD- on Zocor 40 mg daily.  Has had a lot of muscle aches and would like to cut back dose.  Lab Results  Component Value Date   CHOL 129 04/30/2011   HDL 40.70 04/30/2011   LDLCALC 68 04/30/2011   TRIG 102.0 04/30/2011   CHOLHDL 3 04/30/2011    Patient Active Problem List  Diagnoses  . HYPERLIPIDEMIA  . ANEMIA, PERNICIOUS  . HYPERTENSION  . C A D  . DIVERTICULOSIS, COLON  . UTI  . BENIGN PROSTATIC HYPERTROPHY  . CELLULITIS AND ABSCESS OF OTHER SPECIFIED SITE  . KNEE PAIN, RIGHT  . DEGENERATIVE DISC DISEASE  . COSTOCHONDRITIS  . DIZZINESS  . SLEEP APNEA  . WEIGHT LOSS, ABNORMAL  . CHEST PAIN  . HYPERGLYCEMIA, MILD  . COLONIC POLYPS, HX OF  . HIP REPLACEMENT, RIGHT, HX OF  . TONSILLECTOMY AND ADENOIDECTOMY, HX OF  . TOTAL KNEE  REPLACEMENT, HX OF  . FATIGUE  . KNEE REPLACEMENT, RIGHT, HX OF  . Seasonal allergies  . Sinusitis  . Nausea & vomiting  . Elevated liver function tests  . Recurrent fever  . Choledocholithiasis with chronic cholecystitis   Past Medical History  Diagnosis Date  . Coronary atherosclerosis of unspecified type of vessel, native or graft   . Personal history of colonic polyps     adenomatous  . Hypertrophy of prostate without urinary obstruction and other lower urinary tract symptoms (LUTS)   . Unspecified essential hypertension   . Other and unspecified hyperlipidemia   . Diverticulosis of colon (without mention of hemorrhage)   . Degeneration of intervertebral disc, site unspecified   . Cluster headache   . Pernicious anemia   . Neurogenic dysphagia   . Osteoarthritis   . CHF (congestive heart failure)   . Unspecified sleep apnea     does not have cpap-was not able to tolerate  . Prostate enlargement     urinary frequency, nocturia  . Swallowing difficulty    Past Surgical History  Procedure Date  . Inguinal herniorrhapy 2003  . Uvulectomy 1993  . Knee arthroscopy     5 times  . Total knee arthroplasty 2003  . Rotator cuff repair 2006  . Biopsy prostate 2008    Benign  .  Tonsillectomy   . Total hip arthroplasty   . Hernia repair 2004    RIH  . Bone marrow biopsy 2011  . Nasal endoscopy 2012  . Joint replacement     both knees and right hip  . Rt rotator cuff repair 2006   . Cholecystectomy 11/24/2010    Procedure: LAPAROSCOPIC CHOLECYSTECTOMY WITH INTRAOPERATIVE CHOLANGIOGRAM;  Surgeon: Velora Heckler, MD;  Location: [facility];  Service: General;  Laterality: N/A;  c-arm   History  Substance Use Topics  . Smoking status: Former Smoker    Types: Pipe    Quit date: 01/18/1966  . Smokeless tobacco: Never Used  . Alcohol Use: No   Family History  Problem Relation Age of Onset  . Colon cancer Father   . Cancer Father     colon  . Peripheral vascular disease  Mother    Allergies  Allergen Reactions  . Penicillins Other (See Comments)    Whelps, passed out  . Sulfamethoxazole W/Trimethoprim Rash  . Sulfonamide Derivatives Rash   Current Outpatient Prescriptions on File Prior to Visit  Medication Sig Dispense Refill  . aspirin 81 MG tablet Take 81 mg by mouth every morning.       . cyanocobalamin (,VITAMIN B-12,) 1000 MCG/ML injection Inject 1,000 mcg into the muscle every 30 (thirty) days.       Marland Kitchen doxazosin (CARDURA) 4 MG tablet Take 2 mg by mouth every morning.       . metoprolol succinate (TOPROL-XL) 25 MG 24 hr tablet Take 12.5 mg by mouth at bedtime. Take one half tablet daily      . nitroGLYCERIN (NITROSTAT) 0.4 MG SL tablet Place 1 tablet (0.4 mg total) under the tongue every 5 (five) minutes as needed. Up to 3 doses  25 tablet  11  . Omega-3 Fatty Acids (FISH OIL) 1200 MG CAPS Take 1 capsule by mouth daily.       Marland Kitchen DISCONTD: simvastatin (ZOCOR) 40 MG tablet Take 1 tablet (40 mg total) by mouth at bedtime.  30 tablet  12   The PMH, PSH, Social History, Family History, Medications, and allergies have been reviewed in [facility], and have been updated if relevant.  ROS: See HPI Patient reports no  vision/ hearing changes,anorexia, weight change, fever ,adenopathy, persistant / recurrent hoarseness, swallowing issues, chest pain, edema,persistant / recurrent cough, hemoptysis, dyspnea(rest, exertional, paroxysmal nocturnal), gastrointestinal  bleeding (melena, rectal bleeding), abdominal pain, excessive heart burn, GU symptoms(dysuria, hematuria, pyuria, voiding/incontinence  Issues) syncope, focal weakness, severe memory loss, concerning skin lesions, depression, anxiety, abnormal bruising/bleeding, major joint swelling.    Physical exam: BP 140/80  Pulse 80  Temp(Src) 97.8 F (36.6 C) (Oral)  Ht 5' 1.25" (1.556 m)  Wt 155 lb (70.308 kg)  BMI 29.05 kg/m2 General:  overweght male in NAD Eyes:  PERRL Ears:  External ear exam shows no  significant lesions or deformities.  Otoscopic examination reveals clear canals, tympanic membranes are intact bilaterally without bulging, retraction, inflammation or discharge. Hearing is grossly normal bilaterally. Nose:  External nasal examination shows no deformity or inflammation. Nasal mucosa are pink and moist without lesions or exudates. Mouth:  Oral mucosa and oropharynx without lesions or exudates.  Teeth in good repair. Neck:  no carotid bruit or thyromegaly no cervical or supraclavicular lymphadenopathy  Lungs:  Normal respiratory effort, chest expands symmetrically. Lungs are clear to auscultation, no crackles or wheezes. Heart:  Normal rate and regular rhythm. S1 and S2 normal without gallop, murmur, click, rub or  other extra sounds. Abdomen:  Bowel sounds positive,abdomen soft and non-tender without masses, organomegaly or hernias noted. Pulses:  R and L posterior tibial pulses are full and equal bilaterally  Extremities:  no edema   Assessment and Plan:  1. Routine general medical examination at a health care facility  The patients weight, height, BMI and visual acuity have been recorded in the chart I have made referrals, counseling and provided education to the patient based review of the above and I have provided the pt with a written personalized care plan for preventive services.   Td vaccine greater than or equal to 7yo preservative free IM  2. HYPERLIPIDEMIA  FLP excellent this month. Will cut dose of Simvastatin to 20 mg qhs. Recheck labs in 6 months. The patient indicates understanding of these issues and agrees with the plan.    3. HYPERTENSION  Stable.

## 2011-05-05 NOTE — Patient Instructions (Addendum)
Great to see you. Please drop off your Medicare form at your convenience. Let's cut your simvastatin in half to 20 mg daily- we will recheck your labs in 6 months.

## 2011-05-06 ENCOUNTER — Telehealth: Payer: Self-pay | Admitting: *Deleted

## 2011-05-06 NOTE — Telephone Encounter (Signed)
Message copied by Eliezer Bottom on Thu May 06, 2011  4:58 PM ------      Message from: Dianne Dun      Created: Wed May 05, 2011  8:51 AM       Please fax PSA results to Dr. Patsi Sears.

## 2011-05-06 NOTE — Telephone Encounter (Signed)
Copy sent

## 2011-06-15 ENCOUNTER — Telehealth: Payer: Self-pay

## 2011-06-15 NOTE — Telephone Encounter (Signed)
Pt walked in;10:30am pt at bank felt mid sternal chest pain and dizziness; took ntg with relief of pain; teller called EMTt had EKG strip and BP 115/80. Thought blood sugar dropped/ pt had not eaten. Pt ate piece of pound cake and applejuice and felt OK. Pt said same symptoms occurred 3 months ago. Now pt feels OK little weak, no pain, no SOB,no dizziness, no N&V and no fever. Jacki Cones advised pt could be seen later in week. Pt scheduled appt 06/16/11 7:30 am. If pts symptoms change or worsen will call back or go to UC or ER. Pt going to eat lunch now.

## 2011-06-16 ENCOUNTER — Encounter: Payer: Self-pay | Admitting: Family Medicine

## 2011-06-16 ENCOUNTER — Ambulatory Visit (INDEPENDENT_AMBULATORY_CARE_PROVIDER_SITE_OTHER): Payer: Medicare Other | Admitting: Family Medicine

## 2011-06-16 VITALS — BP 140/80 | Temp 97.9°F | Wt 155.0 lb

## 2011-06-16 DIAGNOSIS — R079 Chest pain, unspecified: Secondary | ICD-10-CM

## 2011-06-16 NOTE — Patient Instructions (Signed)
Good to see you. I do think that your episode was from low blood sugar yesterday. As we talked about, the one concerning symptom is that your chest pain responded to nitroglycerin. If the pain returns, please go to ER or call Dr. Ludwig Clarks office immediately.

## 2011-06-16 NOTE — Progress Notes (Signed)
HPI: [name] is a pleasant gentleman who has a history of CAD by cardiac catheterization in 2000, followed by Dr. Jens Som.  At that time he had a 60-70% proximal LAD, 70-80% stenosis in a small intermediate and a 20% stenosis in the mid right coronary artery. His LV function was normal.  His most recent Myoview was performed in April of 2011.  At that time, his ejection fraction was 85% and there was apical thinning but no ischemia or infarction.   Pt had episode of CP and dizziness yesterday.  [name] was at the bank yesterday morning when he an acute onset of mid sternal chest pain and dizziness; took ntg which did relieve pain.  Bank teller called EMS.  Per pt had EKG strip and BP 115/80. Thought blood sugar dropped as he hadnot eaten. He ate piece of pound cake and applejuice and felt OK afterwards.    Feels fine now.  No CP, SOB or palpitations.  Has had episodes like this in past when he did not eat.   Current Outpatient Prescriptions  Medication Sig Dispense Refill  . aspirin 81 MG tablet Take 81 mg by mouth every morning.       . cyanocobalamin (,VITAMIN B-12,) 1000 MCG/ML injection Inject 1,000 mcg into the muscle every 30 (thirty) days.       Marland Kitchen doxazosin (CARDURA) 4 MG tablet Take 2 mg by mouth every morning.       . metoprolol succinate (TOPROL-XL) 25 MG 24 hr tablet Take 12.5 mg by mouth at bedtime. Take one half tablet daily      . nitroGLYCERIN (NITROSTAT) 0.4 MG SL tablet Place 1 tablet (0.4 mg total) under the tongue every 5 (five) minutes as needed. Up to 3 doses  25 tablet  11  . Omega-3 Fatty Acids (FISH OIL) 1200 MG CAPS Take 1 capsule by mouth daily.       . simvastatin (ZOCOR) 40 MG tablet Take 0.5 tablets (20 mg total) by mouth at bedtime.  30 tablet  12     Past Medical History  Diagnosis Date  . Coronary atherosclerosis of unspecified type of vessel, native or graft   . Personal history of colonic polyps     adenomatous  . Hypertrophy of prostate  without urinary obstruction and other lower urinary tract symptoms (LUTS)   . Unspecified essential hypertension   . Other and unspecified hyperlipidemia   . Diverticulosis of colon (without mention of hemorrhage)   . Degeneration of intervertebral disc, site unspecified   . Cluster headache   . Pernicious anemia   . Neurogenic dysphagia   . Osteoarthritis   . CHF (congestive heart failure)   . Unspecified sleep apnea     does not have cpap-was not able to tolerate  . Prostate enlargement     urinary frequency, nocturia  . Swallowing difficulty     Past Surgical History  Procedure Date  . Inguinal herniorrhapy 2003  . Uvulectomy 1993  . Knee arthroscopy     5 times  . Total knee arthroplasty 2003  . Rotator cuff repair 2006  . Biopsy prostate 2008    Benign  . Tonsillectomy   . Total hip arthroplasty   . Hernia repair 2004    RIH  . Bone marrow biopsy 2011  . Nasal endoscopy 2012  . Joint replacement     both knees and right hip  . Rt rotator cuff repair 2006   . Cholecystectomy 11/24/2010  Procedure: LAPAROSCOPIC CHOLECYSTECTOMY WITH INTRAOPERATIVE CHOLANGIOGRAM;  Surgeon: Velora Heckler, MD;  Location: [facility];  Service: General;  Laterality: N/A;  c-arm    History   Social History  . Marital Status: Married    Spouse Name: N/A    Number of Children: 2  . Years of Education: N/A   Occupational History  . retired    Social History Main Topics  . Smoking status: Former Smoker    Types: Pipe    Quit date: 01/18/1966  . Smokeless tobacco: Never Used  . Alcohol Use: No  . Drug Use: No  . Sexually Active: Not on file   Other Topics Concern  . Not on file   Social History Narrative  . No narrative on file    ROS: Arthralgias but no fevers or chills, productive cough, hemoptysis, dysphasia, odynophagia, melena, hematochezia, dysuria, hematuria, rash, seizure activity, orthopnea, PND, pedal edema, claudication. Remaining systems are negative.  Physical  Exam: BP 140/80  Temp 97.9 F (36.6 C)  Wt 155 lb (70.308 kg)  Well-developed well-nourished in no acute distress.  Skin is warm and dry.  HEENT is normal.  Neck is supple. No thyromegaly.  Chest is clear to auscultation with normal expansion.  Cardiovascular exam is regular rate and rhythm.  Abdominal exam nontender or distended. No masses palpated. Extremities show no edema. neuro grossly intact  ECG sinus rhythm at a rate of 79. Left axis deviation. No significant ST changes.  Assessment and Plan: 1. CHEST PAIN  EKG 12-Lead   New- resolved. EKG reassuring but did get relief with NTG. Dizziness and weakness likely due to hypoglycemia. Advised follow up immediately with Dr. Jens Som if symptoms return. The patient indicates understanding of these issues and agrees with the plan.

## 2011-06-18 ENCOUNTER — Ambulatory Visit: Payer: Medicare Other

## 2011-06-22 ENCOUNTER — Ambulatory Visit (INDEPENDENT_AMBULATORY_CARE_PROVIDER_SITE_OTHER): Payer: Medicare Other

## 2011-06-22 DIAGNOSIS — E538 Deficiency of other specified B group vitamins: Secondary | ICD-10-CM

## 2011-06-22 MED ORDER — CYANOCOBALAMIN 1000 MCG/ML IJ SOLN
1000.0000 ug | Freq: Once | INTRAMUSCULAR | Status: AC
  Administered 2011-06-22: 1000 ug via INTRAMUSCULAR

## 2011-07-26 ENCOUNTER — Other Ambulatory Visit: Payer: Self-pay | Admitting: *Deleted

## 2011-07-26 MED ORDER — METOPROLOL SUCCINATE ER 25 MG PO TB24: 12.5000 mg | ORAL_TABLET | Freq: Every day | ORAL | Status: DC

## 2011-08-10 ENCOUNTER — Encounter: Payer: Self-pay | Admitting: Family Medicine

## 2011-08-10 ENCOUNTER — Telehealth: Payer: Self-pay

## 2011-08-10 ENCOUNTER — Encounter: Payer: Medicare Other | Admitting: Family Medicine

## 2011-08-10 ENCOUNTER — Ambulatory Visit (INDEPENDENT_AMBULATORY_CARE_PROVIDER_SITE_OTHER): Payer: Medicare Other | Admitting: Family Medicine

## 2011-08-10 ENCOUNTER — Ambulatory Visit (INDEPENDENT_AMBULATORY_CARE_PROVIDER_SITE_OTHER): Payer: Medicare Other

## 2011-08-10 VITALS — BP 124/78 | HR 72 | Temp 98.0°F | Wt 158.0 lb

## 2011-08-10 DIAGNOSIS — S0095XA Superficial foreign body of unspecified part of head, initial encounter: Secondary | ICD-10-CM | POA: Insufficient documentation

## 2011-08-10 DIAGNOSIS — L089 Local infection of the skin and subcutaneous tissue, unspecified: Secondary | ICD-10-CM

## 2011-08-10 DIAGNOSIS — E538 Deficiency of other specified B group vitamins: Secondary | ICD-10-CM

## 2011-08-10 DIAGNOSIS — Z1839 Other retained organic fragments: Secondary | ICD-10-CM

## 2011-08-10 MED ORDER — CYANOCOBALAMIN 1000 MCG/ML IJ SOLN
1000.0000 ug | Freq: Once | INTRAMUSCULAR | Status: AC
  Administered 2011-08-10: 1000 ug via INTRAMUSCULAR

## 2011-08-10 NOTE — Progress Notes (Signed)
  Subjective:    Patient ID: [name], male    DOB: Jun 25, 1933, 78 y.o.   MRN: [id number]  HPI CC: tick bite  Gets monthly B12 shots.  After last shot, had scab that later turned out to be tick, unsure how long it was attached but thinks over 1 week.  Tick was removed about 3-4 weeks ago.  Worried because scab has continued to be present.  Hasn't fully healed.  No fevers/chills, rashes, HA, neck pain, abd pain,n/v, joint pains.  Review of Systems Per HPI    Objective:   Physical Exam WDWN CM NAD Left upper arm around deltoid with nodule with central puncture mark, possible foreign object present.  Some induration and mild erythema around site of bite.  Attempted removal of debris with forceps and then 18g needle, unable to get deep dark area.  Shave biopsy discussed, IC obtained and asked to scan.  After cleaning area with alcohol pad, performed shave biopsy with shave scalpel, using about 1cc of 1% lidocaine with epi for ansethesia.  After first shave, still had dark debris remaining so did second pass, no black matter seen.  Minimal blood loss.  Pt tolerated well.    Assessment & Plan:

## 2011-08-10 NOTE — Assessment & Plan Note (Addendum)
Unable to remove suspected tick head with forceps or 18 g needle, so shave biopsy of area done. Pt tolerated well. Anticipate outside of window for concern for infection with tick borne illness. Knows to call back if any sxs of RMSF or lyme disease, reviewed sxs.

## 2011-08-10 NOTE — Telephone Encounter (Signed)
Pt came in today for Vit B 12 injection. Pt said after B 12 injection 06/22/11 last month pt thought there was a scab where got B 12 shot. After ? # of days pt noticed the scab was getting bigger and it was a tick. Pt pulled tick off. Not positive got the head: No rash or itching but small red spot with white center( up higher than where got B 12 shot.). Pt concerned since spot is still there. Pt to see Dr Sharen Hones today at 3:15.

## 2011-08-10 NOTE — Telephone Encounter (Signed)
Will see

## 2011-08-13 ENCOUNTER — Telehealth: Payer: Self-pay | Admitting: *Deleted

## 2011-08-13 NOTE — Telephone Encounter (Signed)
Noted. Thanks.

## 2011-08-13 NOTE — Telephone Encounter (Signed)
Patient stopped by to have someone look at the bx site before the weekend. Looked at site and confirmed with patient that it appeared to be healing well. Slight redness just around immediate area. No warmth, swelling,streaking or purulent drainage. Advised that if he noticed any of those signs or if he began running temp over the weekend to seek urgent care, otherwise keep area clean and covered and anticipate good healing. He understood to call next week if anything changed.

## 2011-09-03 ENCOUNTER — Other Ambulatory Visit (HOSPITAL_BASED_OUTPATIENT_CLINIC_OR_DEPARTMENT_OTHER): Payer: Medicare Other | Admitting: Lab

## 2011-09-03 ENCOUNTER — Telehealth: Payer: Self-pay | Admitting: Oncology

## 2011-09-03 ENCOUNTER — Ambulatory Visit (HOSPITAL_BASED_OUTPATIENT_CLINIC_OR_DEPARTMENT_OTHER): Payer: Medicare Other | Admitting: Oncology

## 2011-09-03 VITALS — BP 128/70 | HR 80 | Temp 98.6°F | Resp 18 | Ht 61.0 in | Wt 157.0 lb

## 2011-09-03 DIAGNOSIS — D72821 Monocytosis (symptomatic): Secondary | ICD-10-CM

## 2011-09-03 DIAGNOSIS — D72819 Decreased white blood cell count, unspecified: Secondary | ICD-10-CM

## 2011-09-03 DIAGNOSIS — D510 Vitamin B12 deficiency anemia due to intrinsic factor deficiency: Secondary | ICD-10-CM

## 2011-09-03 DIAGNOSIS — D6959 Other secondary thrombocytopenia: Secondary | ICD-10-CM

## 2011-09-03 LAB — CBC WITH DIFFERENTIAL/PLATELET
Basophils Absolute: 0 10*3/uL (ref 0.0–0.1)
Eosinophils Absolute: 0 10*3/uL (ref 0.0–0.5)
LYMPH%: 11.2 % — ABNORMAL LOW (ref 14.0–49.0)
MCV: 82.5 fL (ref 79.3–98.0)
MONO%: 22.3 % — ABNORMAL HIGH (ref 0.0–14.0)
NEUT#: 5.8 10*3/uL (ref 1.5–6.5)
Platelets: 82 10*3/uL — ABNORMAL LOW (ref 140–400)
RBC: 5.11 10*6/uL (ref 4.20–5.82)

## 2011-09-03 LAB — MORPHOLOGY

## 2011-09-03 LAB — CHCC SMEAR

## 2011-09-03 NOTE — Telephone Encounter (Signed)
gve the pt his feb,aug 2014 appt calendar

## 2011-09-03 NOTE — Progress Notes (Signed)
Hematology and Oncology Follow Up Visit  [name] [id number] 1933/08/23 78 y.o. 09/03/2011 12:21 PM   Principle Diagnosis:No diagnosis found.   Interim History:   A follow-up visit for this 78 year old man initially referred to our office back in December 2010 for further evaluation of pancytopenia with marked macrocytosis of the red cells.  He was found to have pernicious anemia and started on B12 replacement.  He had dramatic improvement in his hemoglobin, but white count and platelet count remained borderline decreased.  He has a peripheral blood monocytosis.  I was concerned that there might be another underlying bone marrow disorder.  I did a bone marrow aspiration and biopsy on 01/30/2009.  Marrow was hypercellular for age up to 75%.  Maturation did appear normal in all cell lines.  There were no increased blasts.  Cytogenetics studies returned normal.  There were no ring sideroblasts on review of the iron stains of the marrow.  The blood counts have remained overall stable over time with some fluctuations. White count went up back in November 2012 at time of acute cholecystitis. He had a laparoscopic cholecystectomy on 11/24/2010.  Medications: reviewed  Allergies:  Allergies  Allergen Reactions  . Penicillins Other (See Comments)    Whelps, passed out  . Sulfamethoxazole W-Trimethoprim Rash  . Sulfonamide Derivatives Rash    Review of Systems: Constitutional:   No constitutional symptoms Respiratory: No cough or dyspnea Cardiovascular:  No chest pain or palpitations Gastrointestinal: No change in bowel but Genito-Urinary: Not questioned Musculoskeletal: Increasing arthritis pain Neurologic: No headache or change in vision Skin: No rash or ecchymosis Remaining ROS negative.  Physical Exam: Blood pressure 128/70, pulse 80, temperature 98.6 F (37 C), temperature source Oral, resp. rate 18, height 5\' 1"  (1.549 m), weight 157 lb (71.215 kg). Wt Readings from Last 3  Encounters:  09/03/11 157 lb (71.215 kg)  08/10/11 158 lb (71.668 kg)  06/16/11 155 lb (70.308 kg)     General appearance: Well-nourished Caucasian man HENNT: Pharynx no erythema or exudate Lymph nodes: No lymphadenopathy Breasts: Lungs: Clear to auscultation resonant to percussion Heart: Regular rhythm no murmur Abdomen: Soft nontender no mass no organomegaly Extremities: No edema no calf tenderness Vascular: No cyanosis Neurologic: No focal deficit Skin: No rash or ecchymosis  Lab Results: Lab Results  Component Value Date   WBC 8.8 09/03/2011   HGB 14.0 09/03/2011   HCT 42.2 09/03/2011   MCV 82.5 09/03/2011   PLT 82* 09/03/2011     Chemistry      Component Value Date/Time   NA 141 04/30/2011 1012   K 3.9 04/30/2011 1012   CL 107 04/30/2011 1012   CO2 28 04/30/2011 1012   BUN 7 04/30/2011 1012   CREATININE 0.9 04/30/2011 1012      Component Value Date/Time   CALCIUM 8.8 04/30/2011 1012   ALKPHOS 73 04/30/2011 1012   AST 21 04/30/2011 1012   ALT 16 04/30/2011 1012   BILITOT 0.7 04/30/2011 1012    White count differential: 66% neutrophils, 11% lymphocytes, 22% monocytes  Review of peripheral blood film: Slightly decreased platelets. Increased monocytes but stable compared with prior studies. No blasts.  Impression and Plan: #1. Pernicious anemia. Complete response to B12 replacement. He continues on monthly B12 injections.  #2. Idiopathic leukopenia, monocytosis, and thrombocytopenia. Despite nondiagnostic bone marrow biopsy in the past, he likely has a myelodysplastic syndrome. Fortunately blood counts have been stable over 3 years of observation.  Plan: We will continue to check blood  counts every 6 months with clinical exam once a year.   CC:. Dr. Enos Fling [facility]; Dr. Lina Sar   Levert Feinstein, MD 8/16/201312:21 PM

## 2011-10-12 ENCOUNTER — Ambulatory Visit (INDEPENDENT_AMBULATORY_CARE_PROVIDER_SITE_OTHER): Payer: Medicare Other | Admitting: *Deleted

## 2011-10-12 DIAGNOSIS — E538 Deficiency of other specified B group vitamins: Secondary | ICD-10-CM

## 2011-10-12 MED ORDER — CYANOCOBALAMIN 1000 MCG/ML IJ SOLN
1000.0000 ug | Freq: Once | INTRAMUSCULAR | Status: AC
  Administered 2011-10-12: 1000 ug via INTRAMUSCULAR

## 2011-10-26 ENCOUNTER — Ambulatory Visit (INDEPENDENT_AMBULATORY_CARE_PROVIDER_SITE_OTHER): Payer: Medicare Other

## 2011-10-26 DIAGNOSIS — Z23 Encounter for immunization: Secondary | ICD-10-CM

## 2011-11-11 ENCOUNTER — Ambulatory Visit (INDEPENDENT_AMBULATORY_CARE_PROVIDER_SITE_OTHER): Payer: Medicare Other | Admitting: *Deleted

## 2011-11-11 DIAGNOSIS — E538 Deficiency of other specified B group vitamins: Secondary | ICD-10-CM

## 2011-11-11 MED ORDER — CYANOCOBALAMIN 1000 MCG/ML IJ SOLN
1000.0000 ug | Freq: Once | INTRAMUSCULAR | Status: AC
  Administered 2011-11-11: 1000 ug via INTRAMUSCULAR

## 2011-12-14 ENCOUNTER — Ambulatory Visit: Payer: Medicare Other

## 2011-12-21 ENCOUNTER — Ambulatory Visit (INDEPENDENT_AMBULATORY_CARE_PROVIDER_SITE_OTHER): Payer: Medicare Other | Admitting: *Deleted

## 2011-12-21 DIAGNOSIS — E538 Deficiency of other specified B group vitamins: Secondary | ICD-10-CM

## 2011-12-21 MED ORDER — CYANOCOBALAMIN 1000 MCG/ML IJ SOLN
1000.0000 ug | Freq: Once | INTRAMUSCULAR | Status: AC
  Administered 2011-12-21: 1000 ug via INTRAMUSCULAR

## 2012-01-25 ENCOUNTER — Ambulatory Visit (INDEPENDENT_AMBULATORY_CARE_PROVIDER_SITE_OTHER): Payer: Medicare Other | Admitting: *Deleted

## 2012-01-25 DIAGNOSIS — E538 Deficiency of other specified B group vitamins: Secondary | ICD-10-CM

## 2012-01-25 MED ORDER — CYANOCOBALAMIN 1000 MCG/ML IJ SOLN
1000.0000 ug | Freq: Once | INTRAMUSCULAR | Status: AC
  Administered 2012-01-25: 1000 ug via INTRAMUSCULAR

## 2012-02-29 ENCOUNTER — Ambulatory Visit (INDEPENDENT_AMBULATORY_CARE_PROVIDER_SITE_OTHER): Payer: Medicare Other | Admitting: *Deleted

## 2012-02-29 DIAGNOSIS — E538 Deficiency of other specified B group vitamins: Secondary | ICD-10-CM

## 2012-02-29 MED ORDER — CYANOCOBALAMIN 1000 MCG/ML IJ SOLN
1000.0000 ug | Freq: Once | INTRAMUSCULAR | Status: AC
  Administered 2012-02-29: 1000 ug via INTRAMUSCULAR

## 2012-03-02 ENCOUNTER — Telehealth: Payer: Self-pay | Admitting: *Deleted

## 2012-03-02 NOTE — Telephone Encounter (Signed)
Pt's wife states she left a message earlier to cancel appts for tomorrow due to the weather.  POF sent to schedulers to r/s.

## 2012-03-03 ENCOUNTER — Other Ambulatory Visit: Payer: Medicare Other | Admitting: Lab

## 2012-03-03 ENCOUNTER — Telehealth: Payer: Self-pay | Admitting: Oncology

## 2012-03-03 ENCOUNTER — Ambulatory Visit: Payer: Medicare Other | Admitting: Nurse Practitioner

## 2012-03-03 NOTE — Telephone Encounter (Signed)
Called pt back and r/s appt to 2/18 lab and ML

## 2012-03-07 ENCOUNTER — Telehealth: Payer: Self-pay | Admitting: Oncology

## 2012-03-07 ENCOUNTER — Other Ambulatory Visit (HOSPITAL_BASED_OUTPATIENT_CLINIC_OR_DEPARTMENT_OTHER): Payer: Medicare Other | Admitting: Lab

## 2012-03-07 ENCOUNTER — Other Ambulatory Visit: Payer: Self-pay | Admitting: *Deleted

## 2012-03-07 ENCOUNTER — Ambulatory Visit (HOSPITAL_BASED_OUTPATIENT_CLINIC_OR_DEPARTMENT_OTHER): Payer: Medicare Other | Admitting: Nurse Practitioner

## 2012-03-07 VITALS — BP 134/83 | HR 91 | Temp 97.4°F | Resp 20 | Ht 61.0 in | Wt 158.5 lb

## 2012-03-07 DIAGNOSIS — D696 Thrombocytopenia, unspecified: Secondary | ICD-10-CM

## 2012-03-07 DIAGNOSIS — D510 Vitamin B12 deficiency anemia due to intrinsic factor deficiency: Secondary | ICD-10-CM

## 2012-03-07 DIAGNOSIS — D72819 Decreased white blood cell count, unspecified: Secondary | ICD-10-CM

## 2012-03-07 DIAGNOSIS — D72821 Monocytosis (symptomatic): Secondary | ICD-10-CM

## 2012-03-07 DIAGNOSIS — D6959 Other secondary thrombocytopenia: Secondary | ICD-10-CM

## 2012-03-07 LAB — CBC WITH DIFFERENTIAL/PLATELET
BASO%: 0.5 % (ref 0.0–2.0)
Basophils Absolute: 0 10e3/uL (ref 0.0–0.1)
EOS%: 0.2 % (ref 0.0–7.0)
Eosinophils Absolute: 0 10e3/uL (ref 0.0–0.5)
HCT: 44.9 % (ref 38.4–49.9)
HGB: 15.1 g/dL (ref 13.0–17.1)
LYMPH%: 16.1 % (ref 14.0–49.0)
MCH: 27.6 pg (ref 27.2–33.4)
MCHC: 33.6 g/dL (ref 32.0–36.0)
MCV: 82.3 fL (ref 79.3–98.0)
MONO#: 1.4 10e3/uL — ABNORMAL HIGH (ref 0.1–0.9)
MONO%: 26.1 % — ABNORMAL HIGH (ref 0.0–14.0)
NEUT#: 3.1 10e3/uL (ref 1.5–6.5)
NEUT%: 57.1 % (ref 39.0–75.0)
Platelets: 45 10e3/uL — ABNORMAL LOW (ref 140–400)
RBC: 5.46 10e6/uL (ref 4.20–5.82)
RDW: 15.7 % — ABNORMAL HIGH (ref 11.0–14.6)
WBC: 5.4 10e3/uL (ref 4.0–10.3)
lymph#: 0.9 10e3/uL (ref 0.9–3.3)

## 2012-03-07 LAB — MORPHOLOGY
PLT EST: DECREASED
RBC Comments: NORMAL

## 2012-03-07 LAB — CHCC SMEAR

## 2012-03-07 MED ORDER — METOPROLOL SUCCINATE ER 25 MG PO TB24: ORAL_TABLET | ORAL | Status: DC

## 2012-03-07 NOTE — Progress Notes (Signed)
OFFICE PROGRESS NOTE  Interval history:  [name] returns as scheduled. To review he is a 79 year old man with pernicious anemia maintained on monthly B12 injections. He has a persistent mild leukopenia and thrombocytopenia with monocytosis felt to be secondary to MDS.  He feels well. No fevers or sweats. No interim illnesses or infections. He has a good appetite. He denies any bleeding. He notes easy bruising. No nausea or vomiting. He has intermittent reflux symptoms. He attributes the reflux to diet. He recently began Osteo Bi-Flex and has noted significant improvement in arthritis pain. No chest pain. Intermittent mild dyspnea on exertion.   Objective: Blood pressure 134/83, pulse 91, temperature 97.4 F (36.3 C), temperature source Oral, resp. rate 20, height 5\' 1"  (1.549 m), weight 158 lb 8 oz (71.895 kg).  Oropharynx is without thrush or ulceration. No palpable cervical, supraclavicular or axillary lymph nodes. Lungs are clear. No wheezes or rales. Regular cardiac rhythm. Abdomen is soft and nontender. No organomegaly. Extremities are without edema.  Lab Results: Lab Results  Component Value Date   WBC 8.8 09/03/2011   HGB 14.0 09/03/2011   HCT 42.2 09/03/2011   MCV 82.5 09/03/2011   PLT 82* 09/03/2011    Chemistry:    Chemistry      Component Value Date/Time   NA 141 04/30/2011 1012   K 3.9 04/30/2011 1012   CL 107 04/30/2011 1012   CO2 28 04/30/2011 1012   BUN 7 04/30/2011 1012   CREATININE 0.9 04/30/2011 1012      Component Value Date/Time   CALCIUM 8.8 04/30/2011 1012   ALKPHOS 73 04/30/2011 1012   AST 21 04/30/2011 1012   ALT 16 04/30/2011 1012   BILITOT 0.7 04/30/2011 1012       Studies/Results: No results found.  Medications: I have reviewed the patient's current medications.  Assessment/Plan:  1. Pernicious anemia, he continues monthly B12 injections.  2. Persistent mild leukopenia and thrombocytopenia with monocytosis. Nonspecific bone marrow findings except for  hypercellularity and peripheral blood monocytosis. Dr. Cyndie Chime feels the findings are most compatible with a myelodysplastic syndrome. 3. Degenerative arthritis. 4. Symptomatic gallbladder disease status post cholecystectomy in November 2012.  Disposition-[name] hemoglobin remains in normal range. He will continue monthly B12 injections. The white count is stable. He has progressive thrombocytopenia.  He will return in one month for a CBC. We instructed him to hold his daily aspirin. Only new medication is Osteo Bi-Flex which we also instructed him to place on hold pending the followup CBC in one month.  He has a scheduled followup visit in 6 months. He will contact the office in the interim with any problems. We specifically discussed bleeding.   Plan reviewed with Dr. Cyndie Chime.    [name] ANP/GNP-BC    Dr. [name], Dr. Olga Millers, Dr. Lina Sar

## 2012-03-30 ENCOUNTER — Encounter: Payer: Self-pay | Admitting: Family Medicine

## 2012-03-30 ENCOUNTER — Ambulatory Visit (INDEPENDENT_AMBULATORY_CARE_PROVIDER_SITE_OTHER): Payer: Medicare Other | Admitting: Family Medicine

## 2012-03-30 VITALS — BP 140/80 | HR 96 | Temp 98.6°F | Ht 61.0 in | Wt 156.2 lb

## 2012-03-30 DIAGNOSIS — D510 Vitamin B12 deficiency anemia due to intrinsic factor deficiency: Secondary | ICD-10-CM

## 2012-03-30 DIAGNOSIS — J209 Acute bronchitis, unspecified: Secondary | ICD-10-CM | POA: Insufficient documentation

## 2012-03-30 MED ORDER — BENZONATATE 100 MG PO CAPS: 100.0000 mg | ORAL_CAPSULE | Freq: Two times a day (BID) | ORAL | Status: DC | PRN

## 2012-03-30 MED ORDER — CYANOCOBALAMIN 1000 MCG/ML IJ SOLN
1000.0000 ug | Freq: Once | INTRAMUSCULAR | Status: AC
  Administered 2012-03-30: 1000 ug via INTRAMUSCULAR

## 2012-03-30 NOTE — Patient Instructions (Addendum)
Please restart Mucinex. Finish course of Zpack.  I have also sent a prescription for Tessalon cough to your pharmacy.

## 2012-03-30 NOTE — Progress Notes (Signed)
SUBJECTIVE:  [name] is a 79 y.o. male who complains of coryza, congestion, sneezing, productive cough and wheezing for 4 days. He denies a history of anorexia, fever and denies a history of asthma. Patient denies smoke cigarettes.  Went to [facility] urgent care on 3/10- given rx for zpack.  He is taking this and already feeling better.  Was taking Mucinex and stopped taking it.  Notices his cough is "wetter" since he stopped taking Mucinex.      Patient Active Problem List  Diagnosis  . HYPERLIPIDEMIA  . ANEMIA, PERNICIOUS  . HYPERTENSION  . C A D  . DIVERTICULOSIS, COLON  . BENIGN PROSTATIC HYPERTROPHY  . CELLULITIS AND ABSCESS OF OTHER SPECIFIED SITE  . KNEE PAIN, RIGHT  . DEGENERATIVE DISC DISEASE  . COSTOCHONDRITIS  . DIZZINESS  . SLEEP APNEA  . CHEST PAIN  . HYPERGLYCEMIA, MILD  . COLONIC POLYPS, HX OF  . HIP REPLACEMENT, RIGHT, HX OF  . TONSILLECTOMY AND ADENOIDECTOMY, HX OF  . TOTAL KNEE REPLACEMENT, HX OF  . FATIGUE  . KNEE REPLACEMENT, RIGHT, HX OF  . Seasonal allergies  . Elevated liver function tests  . Choledocholithiasis with chronic cholecystitis  . Embedded tick of head  . Acute bronchitis   Past Medical History  Diagnosis Date  . Coronary atherosclerosis of unspecified type of vessel, native or graft   . Personal history of colonic polyps     adenomatous  . Hypertrophy of prostate without urinary obstruction and other lower urinary tract symptoms (LUTS)   . Unspecified essential hypertension   . Other and unspecified hyperlipidemia   . Diverticulosis of colon (without mention of hemorrhage)   . Degeneration of intervertebral disc, site unspecified   . Cluster headache   . Pernicious anemia   . Neurogenic dysphagia   . Osteoarthritis   . CHF (congestive heart failure)   . Unspecified sleep apnea     does not have cpap-was not able to tolerate  . Prostate enlargement     urinary frequency, nocturia  . Swallowing difficulty    Past Surgical  History  Procedure Laterality Date  . Inguinal herniorrhapy  2003  . Uvulectomy  1993  . Knee arthroscopy      5 times  . Total knee arthroplasty  2003  . Rotator cuff repair  2006  . Biopsy prostate  2008    Benign  . Tonsillectomy    . Total hip arthroplasty    . Hernia repair  2004    [facility]H  . Bone marrow biopsy  2011  . Nasal endoscopy  2012  . Joint replacement      both knees and right hip  . Rt rotator cuff repair 2006    . Cholecystectomy  11/24/2010    Procedure: LAPAROSCOPIC CHOLECYSTECTOMY WITH INTRAOPERATIVE CHOLANGIOGRAM;  Surgeon: Velora Heckler, MD;  Location: [facility];  Service: General;  Laterality: N/A;  c-arm   History  Substance Use Topics  . Smoking status: Former Smoker    Types: Pipe    Quit date: 01/18/1966  . Smokeless tobacco: Never Used  . Alcohol Use: No   Family History  Problem Relation Age of Onset  . Colon cancer Father   . Cancer Father     colon  . Peripheral vascular disease Mother    Allergies  Allergen Reactions  . Penicillins Other (See Comments)    Whelps, passed out  . Sulfamethoxazole W-Trimethoprim Rash  . Sulfonamide Derivatives Rash   Current Outpatient  Prescriptions on File Prior to Visit  Medication Sig Dispense Refill  . CELEBREX 200 MG capsule Take 200 mg by mouth as needed.       Marland Kitchen co-enzyme Q-10 30 MG capsule Take 200 mg by mouth daily.      . cyanocobalamin (,VITAMIN B-12,) 1000 MCG/ML injection Inject 1,000 mcg into the muscle every 30 (thirty) days.       Marland Kitchen doxazosin (CARDURA) 4 MG tablet Take 2 mg by mouth every morning.       . Misc Natural Products (OSTEO BI-FLEX ADV DOUBLE ST PO) Take 1,000 mg by mouth daily.      . nitroGLYCERIN (NITROSTAT) 0.4 MG SL tablet Place 1 tablet (0.4 mg total) under the tongue every 5 (five) minutes as needed. Up to 3 doses  25 tablet  11  . simvastatin (ZOCOR) 40 MG tablet Take 0.5 tablets (20 mg total) by mouth at bedtime.  30 tablet  12  . Tamsulosin HCl (FLOMAX) 0.4 MG CAPS Take 0.4  mg by mouth daily after supper.      . metoprolol succinate (TOPROL-XL) 25 MG 24 hr tablet Take one half tablet daily  30 tablet  2   No current facility-administered medications on file prior to visit.   The PMH, PSH, Social History, Family History, Medications, and allergies have been reviewed in [facility], and have been updated if relevant.  OBJECTIVE: BP 140/80  Pulse 96  Temp(Src) 98.6 F (37 C) (Oral)  Ht 5\' 1"  (1.549 m)  Wt 156 lb 4 oz (70.875 kg)  BMI 29.54 kg/m2  SpO2 95% He appears well, vital signs are as noted. Ears normal.  Throat and pharynx normal.  Neck supple. No adenopathy in the neck. Nose is congested. Sinuses non tender. Scattered exp wheezes otherwise clear, normal respiratory effort  Assessment and Plan: 1. Acute bronchitis Finish course of abx. Add Mucinex for 3 days, tessalon rx sent in prn cough. Call or return to clinic prn if these symptoms worsen or fail to improve as anticipated.   2. ANEMIA, PERNICIOUS  - cyanocobalamin ((VITAMIN B-12)) injection 1,000 mcg; Inject 1 mL (1,000 mcg total) into the muscle once.

## 2012-04-04 ENCOUNTER — Other Ambulatory Visit: Payer: Medicare Other

## 2012-05-03 ENCOUNTER — Encounter: Payer: Self-pay | Admitting: Family Medicine

## 2012-05-03 ENCOUNTER — Ambulatory Visit (INDEPENDENT_AMBULATORY_CARE_PROVIDER_SITE_OTHER): Payer: Medicare Other | Admitting: Family Medicine

## 2012-05-03 VITALS — BP 130/80 | HR 96 | Temp 97.9°F | Wt 157.0 lb

## 2012-05-03 DIAGNOSIS — J209 Acute bronchitis, unspecified: Secondary | ICD-10-CM

## 2012-05-03 MED ORDER — AZITHROMYCIN 250 MG PO TABS: ORAL_TABLET | ORAL | Status: DC
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
## 2012-05-03 NOTE — Patient Instructions (Addendum)
Good to see you. We are treating for bronchitis. Take Zpack as directed. Continue Mucinex.  Restart Nasocort.

## 2012-05-15 ENCOUNTER — Telehealth: Payer: Self-pay | Admitting: Family Medicine

## 2012-05-15 MED ORDER — CYANOCOBALAMIN 1000 MCG PO TABS: 1000.0000 ug | ORAL_TABLET | Freq: Every day | ORAL | Status: DC

## 2012-05-15 NOTE — Telephone Encounter (Signed)
Yes I would like for him to take oral B12.  Rx sent to [facility].  Also can buy OTC.

## 2012-05-15 NOTE — Telephone Encounter (Signed)
Advised patient

## 2012-05-15 NOTE — Telephone Encounter (Signed)
Pt called in ref to his B-12 inj. I explained the nation wide shortage. Do you want him to take it orally? If you can't reach him at his home # he says to call his cell # [phone]. Thank you.

## 2012-05-19 ENCOUNTER — Other Ambulatory Visit: Payer: Self-pay | Admitting: Family Medicine

## 2012-05-19 DIAGNOSIS — R7989 Other specified abnormal findings of blood chemistry: Secondary | ICD-10-CM

## 2012-05-19 DIAGNOSIS — Z136 Encounter for screening for cardiovascular disorders: Secondary | ICD-10-CM

## 2012-05-19 DIAGNOSIS — Z125 Encounter for screening for malignant neoplasm of prostate: Secondary | ICD-10-CM

## 2012-05-19 DIAGNOSIS — D510 Vitamin B12 deficiency anemia due to intrinsic factor deficiency: Secondary | ICD-10-CM

## 2012-05-30 ENCOUNTER — Other Ambulatory Visit (INDEPENDENT_AMBULATORY_CARE_PROVIDER_SITE_OTHER): Payer: Medicare Other

## 2012-05-30 DIAGNOSIS — N400 Enlarged prostate without lower urinary tract symptoms: Secondary | ICD-10-CM

## 2012-05-30 DIAGNOSIS — E785 Hyperlipidemia, unspecified: Secondary | ICD-10-CM

## 2012-05-30 DIAGNOSIS — R5381 Other malaise: Secondary | ICD-10-CM

## 2012-05-30 DIAGNOSIS — R7309 Other abnormal glucose: Secondary | ICD-10-CM

## 2012-05-30 DIAGNOSIS — D510 Vitamin B12 deficiency anemia due to intrinsic factor deficiency: Secondary | ICD-10-CM

## 2012-05-30 DIAGNOSIS — Z136 Encounter for screening for cardiovascular disorders: Secondary | ICD-10-CM

## 2012-05-30 DIAGNOSIS — Z125 Encounter for screening for malignant neoplasm of prostate: Secondary | ICD-10-CM

## 2012-05-30 DIAGNOSIS — R7989 Other specified abnormal findings of blood chemistry: Secondary | ICD-10-CM

## 2012-05-30 DIAGNOSIS — I10 Essential (primary) hypertension: Secondary | ICD-10-CM

## 2012-05-30 LAB — COMPREHENSIVE METABOLIC PANEL
Albumin: 3.8 g/dL (ref 3.5–5.2)
CO2: 29 mEq/L (ref 19–32)
Calcium: 8.8 mg/dL (ref 8.4–10.5)
GFR: 92.98 mL/min (ref >60.00)
Glucose, Bld: 82 mg/dL (ref 70–99)
Potassium: 4.6 mEq/L (ref 3.5–5.1)
Sodium: 143 mEq/L (ref 135–145)
Total Bilirubin: 0.7 mg/dL (ref 0.3–1.2)
Total Protein: 5.9 g/dL — ABNORMAL LOW (ref 6.0–8.3)

## 2012-05-30 LAB — CBC WITH DIFFERENTIAL/PLATELET
Basophils Relative: 0.6 % (ref 0.0–3.0)
Eosinophils Absolute: 0.1 10*3/uL (ref 0.0–0.7)
Hemoglobin: 15 g/dL (ref 13.0–17.0)
MCHC: 34.2 g/dL (ref 30.0–36.0)
MCV: 82.6 fl (ref 78.0–100.0)
Monocytes Absolute: 1.3 10*3/uL — ABNORMAL HIGH (ref 0.1–1.0)
Neutro Abs: 3.3 10*3/uL (ref 1.4–7.7)
RBC: 5.32 Mil/uL (ref 4.22–5.81)

## 2012-05-30 LAB — LIPID PANEL: Cholesterol: 158 mg/dL (ref 0–200)

## 2012-06-02 ENCOUNTER — Telehealth: Payer: Self-pay

## 2012-06-02 MED ORDER — CYANOCOBALAMIN 1000 MCG/ML IJ SOLN: 1000.0000 ug | INTRAMUSCULAR | Status: DC

## 2012-06-02 NOTE — Telephone Encounter (Signed)
Rx sent 

## 2012-06-02 NOTE — Telephone Encounter (Signed)
Pt request Vit B 12 injection sent to [facility] pharmacy; pt will bring med to his cpx on 06/06/12.

## 2012-06-06 ENCOUNTER — Ambulatory Visit (INDEPENDENT_AMBULATORY_CARE_PROVIDER_SITE_OTHER): Payer: Medicare Other | Admitting: Family Medicine

## 2012-06-06 ENCOUNTER — Encounter: Payer: Self-pay | Admitting: Family Medicine

## 2012-06-06 VITALS — BP 138/66 | HR 88 | Temp 97.6°F | Ht 61.25 in | Wt 156.0 lb

## 2012-06-06 DIAGNOSIS — K5730 Diverticulosis of large intestine without perforation or abscess without bleeding: Secondary | ICD-10-CM

## 2012-06-06 DIAGNOSIS — I2510 Atherosclerotic heart disease of native coronary artery without angina pectoris: Secondary | ICD-10-CM

## 2012-06-06 DIAGNOSIS — Z66 Do not resuscitate: Secondary | ICD-10-CM

## 2012-06-06 DIAGNOSIS — D510 Vitamin B12 deficiency anemia due to intrinsic factor deficiency: Secondary | ICD-10-CM

## 2012-06-06 DIAGNOSIS — N400 Enlarged prostate without lower urinary tract symptoms: Secondary | ICD-10-CM

## 2012-06-06 DIAGNOSIS — Z0000 Encounter for general adult medical examination without abnormal findings: Secondary | ICD-10-CM

## 2012-06-06 DIAGNOSIS — E785 Hyperlipidemia, unspecified: Secondary | ICD-10-CM

## 2012-06-06 DIAGNOSIS — I10 Essential (primary) hypertension: Secondary | ICD-10-CM

## 2012-06-06 DIAGNOSIS — Z1331 Encounter for screening for depression: Secondary | ICD-10-CM

## 2012-06-06 MED ORDER — CYANOCOBALAMIN 1000 MCG/ML IJ SOLN
1000.0000 ug | Freq: Once | INTRAMUSCULAR | Status: AC
  Administered 2012-06-06: 1000 ug via INTRAMUSCULAR

## 2012-06-06 NOTE — Progress Notes (Signed)
Pleasant 79 yo male here for annual medicare wellness visit.  I have personally reviewed the Medicare Annual Wellness questionnaire and have noted 1. The patient's medical and social history 2. Their use of alcohol, tobacco or illicit drugs 3. Their current medications and supplements 4. The patient's functional ability including ADL's, fall risks, home safety risks and hearing or visual             impairment. 5. Diet and physical activities 6. Evidence for depression or mood disorders   End of life wishes discussed and updated in Social History.  Pernicious anemia- has follow up with Dr. Geanie Cooley in July. Still feels great. Due for B12 shot today.  Lab Results  Component Value Date   VITAMINB12 607 05/30/2012   Lab Results  Component Value Date   WBC 5.8 05/30/2012   HGB 15.0 05/30/2012   HCT 43.9 05/30/2012   MCV 82.6 05/30/2012   PLT 67.0 Repeated and verified X2.* 05/30/2012     Elevated PSA- followed by Dr. Patsi Sears, did spike from last year.  On flomax and cardura.  Pt was told he did not need further w/u.  Lab Results  Component Value Date   PSA 6.25* 05/30/2012   PSA 3.74 04/30/2011   PSA 5.62* 08/03/2010   CAD- followed by Dr.  Jens Som. H/o cardiac catheterization in 2000. His most recent Myoview was performed in April of 2011. At that time, his ejection fraction was 85% and there was apical thinning but no ischemia or infarction.  No CP or SOB.  HLD- on Zocor 20 mg every other day. We cut dose in half from 40 mg daily last year due to muscle aches.  Muscle aches have improved.  Lab Results  Component Value Date   CHOL 158 05/30/2012   HDL 35.00* 05/30/2012   LDLCALC 97 05/30/2012   TRIG 128.0 05/30/2012   CHOLHDL 5 05/30/2012    Patient Active Problem List   Diagnosis Date Noted  . Choledocholithiasis with chronic cholecystitis 10/05/2010  . KNEE REPLACEMENT, RIGHT, HX OF 04/02/2010  . HIP REPLACEMENT, RIGHT, HX OF 06/20/2009  . ANEMIA, PERNICIOUS 01/03/2009   . C A D 12/12/2007  . COLONIC POLYPS, HX OF 12/12/2007  . HYPERLIPIDEMIA 06/14/2006  . HYPERTENSION 06/14/2006  . DIVERTICULOSIS, COLON 06/14/2006  . BENIGN PROSTATIC HYPERTROPHY 06/14/2006  . DEGENERATIVE DISC DISEASE 06/14/2006  . SLEEP APNEA 06/14/2006  . TONSILLECTOMY AND ADENOIDECTOMY, HX OF 06/14/2006  . TOTAL KNEE REPLACEMENT, HX OF 06/14/2006   Past Medical History  Diagnosis Date  . Coronary atherosclerosis of unspecified type of vessel, native or graft   . Personal history of colonic polyps     adenomatous  . Hypertrophy of prostate without urinary obstruction and other lower urinary tract symptoms (LUTS)   . Unspecified essential hypertension   . Other and unspecified hyperlipidemia   . Diverticulosis of colon (without mention of hemorrhage)   . Degeneration of intervertebral disc, site unspecified   . Cluster headache   . Pernicious anemia   . Neurogenic dysphagia   . Osteoarthritis   . CHF (congestive heart failure)   . Unspecified sleep apnea     does not have cpap-was not able to tolerate  . Prostate enlargement     urinary frequency, nocturia  . Swallowing difficulty    Past Surgical History  Procedure Laterality Date  . Inguinal herniorrhapy  2003  . Uvulectomy  1993  . Knee arthroscopy      5 times  . Total knee  arthroplasty  2003  . Rotator cuff repair  2006  . Biopsy prostate  2008    Benign  . Tonsillectomy    . Total hip arthroplasty    . Hernia repair  2004    RIH  . Bone marrow biopsy  2011  . Nasal endoscopy  2012  . Joint replacement      both knees and right hip  . Rt rotator cuff repair 2006    . Cholecystectomy  11/24/2010    Procedure: LAPAROSCOPIC CHOLECYSTECTOMY WITH INTRAOPERATIVE CHOLANGIOGRAM;  Surgeon: Velora Heckler, MD;  Location: [facility];  Service: General;  Laterality: N/A;  c-arm   History  Substance Use Topics  . Smoking status: Former Smoker    Types: Pipe    Quit date: 01/18/1966  . Smokeless tobacco: Never Used  .  Alcohol Use: No   Family History  Problem Relation Age of Onset  . Colon cancer Father   . Cancer Father     colon  . Peripheral vascular disease Mother    Allergies  Allergen Reactions  . Penicillins Other (See Comments)    Whelps, passed out  . Sulfamethoxazole W-Trimethoprim Rash  . Sulfonamide Derivatives Rash   Current Outpatient Prescriptions on File Prior to Visit  Medication Sig Dispense Refill  . azithromycin (ZITHROMAX) 250 MG tablet 2 tabs by mouth daily on day 1 followed by 1 tab by mouth daily days 2-5  6 each  0  . benzonatate (TESSALON) 100 MG capsule Take 1 capsule (100 mg total) by mouth 2 (two) times daily as needed for cough.  20 capsule  0  . CELEBREX 200 MG capsule Take 200 mg by mouth as needed.       Marland Kitchen co-enzyme Q-10 30 MG capsule Take 200 mg by mouth daily.      . cyanocobalamin (,VITAMIN B-12,) 1000 MCG/ML injection Inject 1 mL (1,000 mcg total) into the muscle every 30 (thirty) days.  1 mL  3  . cyanocobalamin ([employer] VITAMIN B12) 1000 MCG tablet Take 1 tablet (1,000 mcg total) by mouth daily.  30 tablet  6  . doxazosin (CARDURA) 4 MG tablet Take 2 mg by mouth every morning.       . metoprolol succinate (TOPROL-XL) 25 MG 24 hr tablet Take one half tablet daily  30 tablet  2  . nitroGLYCERIN (NITROSTAT) 0.4 MG SL tablet Place 1 tablet (0.4 mg total) under the tongue every 5 (five) minutes as needed. Up to 3 doses  25 tablet  11  . simvastatin (ZOCOR) 40 MG tablet Take 0.5 tablets (20 mg total) by mouth at bedtime.  30 tablet  12  . Tamsulosin HCl (FLOMAX) 0.4 MG CAPS Take 0.4 mg by mouth daily after supper.       No current facility-administered medications on file prior to visit.   The PMH, PSH, Social History, Family History, Medications, and allergies have been reviewed in [facility], and have been updated if relevant.  ROS: See HPI Patient reports no  vision/ hearing changes,anorexia, weight change, fever ,adenopathy, persistant / recurrent hoarseness,  swallowing issues, chest pain, edema,persistant / recurrent cough, hemoptysis, dyspnea(rest, exertional, paroxysmal nocturnal), gastrointestinal  bleeding (melena, rectal bleeding), abdominal pain, excessive heart burn, GU symptoms(dysuria, hematuria, pyuria, voiding/incontinence  Issues) syncope, focal weakness, severe memory loss, concerning skin lesions, depression, anxiety, abnormal bruising/bleeding, major joint swelling.    Physical exam: BP 138/66  Pulse 88  Temp(Src) 97.6 F (36.4 C)  Ht 5' 1.25" (1.556 m)  Wt  156 lb (70.761 kg)  BMI 29.23 kg/m2 General:  overweght male in NAD Eyes:  PERRL Ears:  External ear exam shows no significant lesions or deformities.  Otoscopic examination reveals clear canals, tympanic membranes are intact bilaterally without bulging, retraction, inflammation or discharge. Hearing is grossly normal bilaterally. Nose:  External nasal examination shows no deformity or inflammation. Nasal mucosa are pink and moist without lesions or exudates. Mouth:  Oral mucosa and oropharynx without lesions or exudates.  Teeth in good repair. Neck:  no carotid bruit or thyromegaly no cervical or supraclavicular lymphadenopathy  Lungs:  Normal respiratory effort, chest expands symmetrically. Lungs are clear to auscultation, no crackles or wheezes. Heart:  Normal rate and regular rhythm. S1 and S2 normal without gallop, murmur, click, rub or other extra sounds. Abdomen:  Bowel sounds positive,abdomen soft and non-tender without masses, organomegaly or hernias noted. Pulses:  R and L posterior tibial pulses are full and equal bilaterally  Extremities:  no edema   Assessment and Plan:   1. ANEMIA, PERNICIOUS Stable.  B12 given today. - cyanocobalamin ((VITAMIN B-12)) injection 1,000 mcg; Inject 1 mL (1,000 mcg total) into the muscle once.  2. HYPERLIPIDEMIA Lipids controlled on lower dose.  No changes.  3. Annual Medicare Wellness Visit The patients weight, height, BMI  and visual acuity have been recorded in the chart I have made referrals, counseling and provided education to the patient based review of the above and I have provided the pt with a written personalized care plan for preventive services.  4. DNR (do not resuscitate) Yellow sheet filled out and returned to pt to place on refrigerator or somewhere else visible in his home.

## 2012-08-01 ENCOUNTER — Ambulatory Visit (INDEPENDENT_AMBULATORY_CARE_PROVIDER_SITE_OTHER): Payer: Medicare Other | Admitting: *Deleted

## 2012-08-01 DIAGNOSIS — D510 Vitamin B12 deficiency anemia due to intrinsic factor deficiency: Secondary | ICD-10-CM

## 2012-08-01 MED ORDER — CYANOCOBALAMIN 1000 MCG/ML IJ SOLN
1000.0000 ug | Freq: Once | INTRAMUSCULAR | Status: AC
  Administered 2012-08-01: 1000 ug via INTRAMUSCULAR

## 2012-08-07 ENCOUNTER — Ambulatory Visit (INDEPENDENT_AMBULATORY_CARE_PROVIDER_SITE_OTHER): Payer: Medicare Other | Admitting: Family Medicine

## 2012-08-07 ENCOUNTER — Encounter: Payer: Self-pay | Admitting: Family Medicine

## 2012-08-07 ENCOUNTER — Telehealth: Payer: Self-pay | Admitting: Family Medicine

## 2012-08-07 VITALS — BP 120/70 | HR 76 | Temp 97.8°F | Ht 61.25 in | Wt 156.0 lb

## 2012-08-07 DIAGNOSIS — R300 Dysuria: Secondary | ICD-10-CM

## 2012-08-07 LAB — POCT URINALYSIS DIPSTICK
Glucose, UA: NEGATIVE
Ketones, UA: NEGATIVE
Protein, UA: NEGATIVE
Spec Grav, UA: 1.02
Urobilinogen, UA: NEGATIVE

## 2012-08-07 MED ORDER — CIPROFLOXACIN HCL 250 MG PO TABS: 250.0000 mg | ORAL_TABLET | Freq: Two times a day (BID) | ORAL | Status: AC

## 2012-08-07 NOTE — Telephone Encounter (Signed)
Patient Information:  Caller Name: Torrin  Phone: [phone]  Patient: [name]  Gender: Male  DOB: 10/03/1933  Age: 79 Years  PCP: [name] [facility])  Office Follow Up:  Does the office need to follow up with this patient?: No  Instructions For The Office: N/A   Symptoms  Reason For Call & Symptoms: Urinary pain  Reviewed Health History In EMR: Yes  Reviewed Medications In EMR: Yes  Reviewed Allergies In EMR: Yes  Reviewed Surgeries / Procedures: Yes  Date of Onset of Symptoms: 08/05/2012  Treatments Tried: over the counter urinary medication  Treatments Tried Worked: No  Guideline(s) Used:  Urination Pain - Male  Disposition Per Guideline:   See Today in Office  Reason For Disposition Reached:   All other males with painful urination, or patient wants to be seen  Advice Given:  Fluids  : Drink extra fluids (Reason: to produce a dilute, nonirritating urine).  Call Back If:  You become worse.  Patient Will Follow Care Advice:  YES  Appointment Scheduled:  08/07/2012 12:15:00 Appointment Scheduled Provider:  [name] [facility])

## 2012-08-07 NOTE — Progress Notes (Signed)
SUBJECTIVE: [name] is a 79 y.o. male who complains of urinary frequency, urgency and dysuria x 4 days, without flank pain, fever, chills, or abnormal vaginal discharge or bleeding.   PMH significant for PCN and Sulfa. Patient Active Problem List   Diagnosis Date Noted  . Dysuria 08/07/2012  . DNR (do not resuscitate) 06/06/2012  . Choledocholithiasis with chronic cholecystitis 10/05/2010  . KNEE REPLACEMENT, RIGHT, HX OF 04/02/2010  . HIP REPLACEMENT, RIGHT, HX OF 06/20/2009  . ANEMIA, PERNICIOUS 01/03/2009  . C A D 12/12/2007  . COLONIC POLYPS, HX OF 12/12/2007  . HYPERLIPIDEMIA 06/14/2006  . HYPERTENSION 06/14/2006  . DIVERTICULOSIS, COLON 06/14/2006  . Elevated PSA, less than 10 ng/ml 06/14/2006  . DEGENERATIVE DISC DISEASE 06/14/2006  . SLEEP APNEA 06/14/2006  . TONSILLECTOMY AND ADENOIDECTOMY, HX OF 06/14/2006  . TOTAL KNEE REPLACEMENT, HX OF 06/14/2006   Past Medical History  Diagnosis Date  . Coronary atherosclerosis of unspecified type of vessel, native or graft   . Personal history of colonic polyps     adenomatous  . Hypertrophy of prostate without urinary obstruction and other lower urinary tract symptoms (LUTS)   . Unspecified essential hypertension   . Other and unspecified hyperlipidemia   . Diverticulosis of colon (without mention of hemorrhage)   . Degeneration of intervertebral disc, site unspecified   . Cluster headache   . Pernicious anemia   . Neurogenic dysphagia   . Osteoarthritis   . CHF (congestive heart failure)   . Unspecified sleep apnea     does not have cpap-was not able to tolerate  . Prostate enlargement     urinary frequency, nocturia  . Swallowing difficulty    Past Surgical History  Procedure Laterality Date  . Inguinal herniorrhapy  2003  . Uvulectomy  1993  . Knee arthroscopy      5 times  . Total knee arthroplasty  2003  . Rotator cuff repair  2006  . Biopsy prostate  2008    Benign  . Tonsillectomy    . Total hip  arthroplasty    . Hernia repair  2004    RIH  . Bone marrow biopsy  2011  . Nasal endoscopy  2012  . Joint replacement      both knees and right hip  . Rt rotator cuff repair 2006    . Cholecystectomy  11/24/2010    Procedure: LAPAROSCOPIC CHOLECYSTECTOMY WITH INTRAOPERATIVE CHOLANGIOGRAM;  Surgeon: Velora Heckler, MD;  Location: [facility];  Service: General;  Laterality: N/A;  c-arm   History  Substance Use Topics  . Smoking status: Former Smoker    Types: Pipe    Quit date: 01/18/1966  . Smokeless tobacco: Never Used  . Alcohol Use: No   Family History  Problem Relation Age of Onset  . Colon cancer Father   . Cancer Father     colon  . Peripheral vascular disease Mother    Allergies  Allergen Reactions  . Penicillins Other (See Comments)    Whelps, passed out  . Sulfamethoxazole W-Trimethoprim Rash  . Sulfonamide Derivatives Rash   Current Outpatient Prescriptions on File Prior to Visit  Medication Sig Dispense Refill  . CELEBREX 200 MG capsule Take 200 mg by mouth as needed.       Marland Kitchen co-enzyme Q-10 30 MG capsule Take 200 mg by mouth daily.      . cyanocobalamin (,VITAMIN B-12,) 1000 MCG/ML injection Inject 1 mL (1,000 mcg total) into the muscle every 30 (thirty)  days.  1 mL  3  . doxazosin (CARDURA) 4 MG tablet Take 2 mg by mouth every morning.       . metoprolol succinate (TOPROL-XL) 25 MG 24 hr tablet Take one half tablet daily  30 tablet  2  . nitroGLYCERIN (NITROSTAT) 0.4 MG SL tablet Place 1 tablet (0.4 mg total) under the tongue every 5 (five) minutes as needed. Up to 3 doses  25 tablet  11  . simvastatin (ZOCOR) 40 MG tablet Take 0.5 tablets (20 mg total) by mouth at bedtime.  30 tablet  12  . Tamsulosin HCl (FLOMAX) 0.4 MG CAPS Take 0.4 mg by mouth daily after supper.       No current facility-administered medications on file prior to visit.   The PMH, PSH, Social History, Family History, Medications, and allergies have been reviewed in [facility], and have been updated if  relevant.  OBJECTIVE:  BP 120/70  Pulse 76  Temp(Src) 97.8 F (36.6 C)  Ht 5' 1.25" (1.556 m)  Wt 156 lb (70.761 kg)  BMI 29.23 kg/m2 Appears well, in no apparent distress.  Vital signs are normal. The abdomen is soft without tenderness, guarding, mass, rebound or organomegaly. No CVA tenderness or inguinal adenopathy noted. Urine dipstick shows positive for WBC's, positive for RBC's and positive for leukocytes.    ASSESSMENT: UTI complicated without evidence of pyelonephritis  PLAN: Treatment per orders - cipro 250 mg twice daily x 7 days, also push fluids, may use Pyridium OTC prn. Send urine for cx. Call or return to clinic prn if these symptoms worsen or fail to improve as anticipated.

## 2012-08-07 NOTE — Addendum Note (Signed)
Addended by: Eliezer Bottom on: 08/07/2012 12:50 PM   Modules accepted: Orders

## 2012-08-07 NOTE — Patient Instructions (Addendum)
Good to see you. Please take cipro as directed- 1 tablet twice daily x 7 days. We will call you with your urine culture results.

## 2012-08-08 LAB — URINE CULTURE: Organism ID, Bacteria: NO GROWTH

## 2012-09-01 ENCOUNTER — Other Ambulatory Visit (HOSPITAL_BASED_OUTPATIENT_CLINIC_OR_DEPARTMENT_OTHER): Payer: Medicare Other | Admitting: Lab

## 2012-09-01 ENCOUNTER — Ambulatory Visit (HOSPITAL_BASED_OUTPATIENT_CLINIC_OR_DEPARTMENT_OTHER): Payer: Medicare Other | Admitting: Oncology

## 2012-09-01 ENCOUNTER — Encounter: Payer: Self-pay | Admitting: Oncology

## 2012-09-01 VITALS — BP 133/69 | HR 69 | Temp 97.8°F | Resp 19 | Ht 61.25 in | Wt 154.2 lb

## 2012-09-01 DIAGNOSIS — D510 Vitamin B12 deficiency anemia due to intrinsic factor deficiency: Secondary | ICD-10-CM

## 2012-09-01 DIAGNOSIS — D72819 Decreased white blood cell count, unspecified: Secondary | ICD-10-CM

## 2012-09-01 DIAGNOSIS — D696 Thrombocytopenia, unspecified: Secondary | ICD-10-CM

## 2012-09-01 DIAGNOSIS — R972 Elevated prostate specific antigen [PSA]: Secondary | ICD-10-CM

## 2012-09-01 HISTORY — DX: Thrombocytopenia, unspecified: D69.6

## 2012-09-01 LAB — CBC WITH DIFFERENTIAL/PLATELET
BASO%: 0.2 % (ref 0.0–2.0)
EOS%: 0.4 % (ref 0.0–7.0)
Eosinophils Absolute: 0 10*3/uL (ref 0.0–0.5)
LYMPH%: 17.3 % (ref 14.0–49.0)
MCH: 27.4 pg (ref 27.2–33.4)
MCHC: 33.4 g/dL (ref 32.0–36.0)
MCV: 82.1 fL (ref 79.3–98.0)
MONO%: 27 % — ABNORMAL HIGH (ref 0.0–14.0)
NEUT#: 2.9 10*3/uL (ref 1.5–6.5)
Platelets: 129 10*3/uL — ABNORMAL LOW (ref 140–400)
RBC: 5.21 10*6/uL (ref 4.20–5.82)
RDW: 14.1 % (ref 11.0–14.6)
nRBC: 0 % (ref 0–0)

## 2012-09-01 LAB — MORPHOLOGY

## 2012-09-01 NOTE — Progress Notes (Signed)
Hematology and Oncology Follow Up Visit  [name] [id number] 1933-02-13 79 y.o. 09/01/2012 12:38 PM   Principle Diagnosis: Encounter Diagnoses  Name Primary?  . ANEMIA, PERNICIOUS Yes  . Elevated PSA, less than 10 ng/ml   . Thrombocytopenia, unspecified      Interim History:   I have followed Mr. stopped now for about 4 years. He presented in December 2010 with pancytopenia and macrocytic red cell indices. He was diagnosed with pernicious anemia and started on B12 replacement. Although he had normalization of his hemoglobin, white blood count and platelet count remained decreased. I did a bone marrow aspiration and biopsy 01/30/2009. Marrow was really not diagnostic with normal maturation of all cell lines. Cytogenetics studies were normal. In the absence of any other unifying diagnosis, I felt that he might have an early, low risk, myelodysplastic syndrome. Over time his blood counts have improved. When he saw [place] back in February, there was an unexplained significant fall in his platelet count 45,000. He had just started a medication called Osteo Bi-Flex. We advised him to stop it. He had recently also stopped simvastatin. Repeat platelet count one month later was 67,000. He tells me has made some additional medication changes and is only taking Norvasc and Altace on a sporadic basis. Blood counts today were surprisingly good. White count and differential are now normal. Platelet count is up to 127,000. It appears that some of the problems with his cytopenias are medication related.  His only complaint today is increasing arthritis pain.   Medications: reviewed  Allergies:  Allergies  Allergen Reactions  . Penicillins Other (See Comments)    Whelps, passed out  . Sulfamethoxazole W-Trimethoprim Rash  . Sulfonamide Derivatives Rash    Review of Systems: Constitutional:   No constitutional symptoms Respiratory: No cough or dyspnea Cardiovascular:  No chest pain or  palpitations Gastrointestinal: No change in bowel habit  Genito-Urinary: No urinary tract symptoms Musculoskeletal: Chronic arthritis pain, sciatica Neurologic: No headache or change in vision Skin: No rash or ecchymosis Remaining ROS negative.  Physical Exam: Blood pressure 133/69, pulse 69, temperature 97.8 F (36.6 C), temperature source Oral, resp. rate 19, height 5' 1.25" (1.556 m), weight 154 lb 3.2 oz (69.945 kg). Wt Readings from Last 3 Encounters:  09/01/12 154 lb 3.2 oz (69.945 kg)  08/07/12 156 lb (70.761 kg)  06/06/12 156 lb (70.761 kg)     General appearance: Well-nourished Caucasian man HENNT: Pharynx no erythema or exudate Lymph nodes: No lymphadenopathy Breasts: Lungs: Clear to auscultation resonant to percussion Heart: Regular rhythm no murmur Abdomen: Soft nontender no mass no organomegaly Extremities: No edema no calf tenderness Musculoskeletal: No joint deformities GU: Vascular: No cyanosis Neurologic: Motor strength 5 over 5, reflexes 1+ symmetric, vibration sensation is normal over the fingertips Skin: No rash or ecchymosis  Lab Results: Without   Chemistry      Component Value Date/Time   NA 143 05/30/2012 0826   K 4.6 05/30/2012 0826   CL 109 05/30/2012 0826   CO2 29 05/30/2012 0826   BUN 11 05/30/2012 0826   CREATININE 0.9 05/30/2012 0826      Component Value Date/Time   CALCIUM 8.8 05/30/2012 0826   ALKPHOS 76 05/30/2012 0826   AST 14 05/30/2012 0826   ALT 11 05/30/2012 0826   BILITOT 0.7 05/30/2012 0826     CBC: Hemoglobin 14.3, hematocrit 42.8, MCV 82, white count 5200, 55% neutrophils, 17 lymphocytes, 27% monocytes, platelet count 129,000   Impression: #1. Pernicious anemia.  Complete normalization of hemoglobin on monthly B12 injections.  #2. Intermittent leukopenia and mild thrombocytopenia Some of this appears to be medication related as outlined above. He still has a peripheral blood monocytosis and a hypercellular bone marrow in the  past without any other specific abnormalities. It is still possible that he has an early myelodysplastic syndrome.  His counts have improved significantly over time. I told him that he did not have to have a formal revisit with me but I will be happy to see him again if his blood counts change in the future.  #3. Elevated PSA. Most recent value in our office done 05/30/2012 was 6.25 which was higher than the previous value of 3.74 done 04/30/2011. He will followup with his primary care physician. I told him if the PSA continues to rise he needs to get back to see Dr. Patsi Sears, urology   CC:. Dr. Enos Fling; Dr.Sig Ruthine Dose, MD 8/15/201412:38 PM

## 2012-09-07 ENCOUNTER — Telehealth: Payer: Self-pay

## 2012-09-07 NOTE — Telephone Encounter (Signed)
Advised patient as instructed. 

## 2012-09-07 NOTE — Telephone Encounter (Signed)
Dr. Patsy Lager note reviewed.  He is pleased with his blood counts on monthly B12 so I would continue this for now.  We can always change this in the future.  He could also restart the baby ASA- we will just need to keep an eye on his CBC every 3 months or so.

## 2012-09-07 NOTE — Telephone Encounter (Signed)
Pt left note; does pt need to continue B 12 injections since red blood cells are back to normal per Dr Cyndie Chime. Should pt restart baby ASA?Please advise.

## 2012-09-12 ENCOUNTER — Ambulatory Visit: Payer: Medicare Other

## 2012-09-14 ENCOUNTER — Ambulatory Visit (INDEPENDENT_AMBULATORY_CARE_PROVIDER_SITE_OTHER): Payer: Medicare Other | Admitting: *Deleted

## 2012-09-14 DIAGNOSIS — E538 Deficiency of other specified B group vitamins: Secondary | ICD-10-CM

## 2012-09-14 MED ORDER — CYANOCOBALAMIN 1000 MCG/ML IJ SOLN
1000.0000 ug | Freq: Once | INTRAMUSCULAR | Status: AC
  Administered 2012-09-14: 1000 ug via INTRAMUSCULAR

## 2012-09-20 ENCOUNTER — Encounter: Payer: Self-pay | Admitting: *Deleted

## 2012-09-21 ENCOUNTER — Ambulatory Visit (INDEPENDENT_AMBULATORY_CARE_PROVIDER_SITE_OTHER): Payer: Medicare Other

## 2012-09-21 DIAGNOSIS — Z23 Encounter for immunization: Secondary | ICD-10-CM

## 2012-09-22 ENCOUNTER — Encounter: Payer: Self-pay | Admitting: Internal Medicine

## 2012-10-04 ENCOUNTER — Other Ambulatory Visit: Payer: Self-pay | Admitting: *Deleted

## 2012-10-04 DIAGNOSIS — D72821 Monocytosis (symptomatic): Secondary | ICD-10-CM

## 2012-10-04 DIAGNOSIS — D72819 Decreased white blood cell count, unspecified: Secondary | ICD-10-CM

## 2012-10-04 DIAGNOSIS — D510 Vitamin B12 deficiency anemia due to intrinsic factor deficiency: Secondary | ICD-10-CM

## 2012-10-04 DIAGNOSIS — D6959 Other secondary thrombocytopenia: Secondary | ICD-10-CM

## 2012-10-05 MED ORDER — CELECOXIB 200 MG PO CAPS: 200.0000 mg | ORAL_CAPSULE | ORAL | Status: DC | PRN

## 2012-10-13 ENCOUNTER — Other Ambulatory Visit: Payer: Self-pay | Admitting: *Deleted

## 2012-10-13 DIAGNOSIS — D72819 Decreased white blood cell count, unspecified: Secondary | ICD-10-CM

## 2012-10-13 DIAGNOSIS — D72821 Monocytosis (symptomatic): Secondary | ICD-10-CM

## 2012-10-13 DIAGNOSIS — D510 Vitamin B12 deficiency anemia due to intrinsic factor deficiency: Secondary | ICD-10-CM

## 2012-10-13 DIAGNOSIS — D6959 Other secondary thrombocytopenia: Secondary | ICD-10-CM

## 2012-10-13 MED ORDER — CELECOXIB 200 MG PO CAPS: 200.0000 mg | ORAL_CAPSULE | ORAL | Status: DC | PRN

## 2012-10-17 ENCOUNTER — Other Ambulatory Visit: Payer: Self-pay

## 2012-10-17 MED ORDER — METOPROLOL SUCCINATE ER 25 MG PO TB24: ORAL_TABLET | ORAL | Status: DC

## 2012-10-27 ENCOUNTER — Ambulatory Visit (INDEPENDENT_AMBULATORY_CARE_PROVIDER_SITE_OTHER): Payer: Medicare Other | Admitting: *Deleted

## 2012-10-27 DIAGNOSIS — E538 Deficiency of other specified B group vitamins: Secondary | ICD-10-CM

## 2012-10-27 MED ORDER — CYANOCOBALAMIN 1000 MCG/ML IJ SOLN
1000.0000 ug | Freq: Once | INTRAMUSCULAR | Status: AC
  Administered 2012-10-27: 1000 ug via INTRAMUSCULAR

## 2012-11-28 ENCOUNTER — Ambulatory Visit (INDEPENDENT_AMBULATORY_CARE_PROVIDER_SITE_OTHER): Payer: Medicare Other

## 2012-11-28 DIAGNOSIS — E538 Deficiency of other specified B group vitamins: Secondary | ICD-10-CM

## 2012-11-28 MED ORDER — CYANOCOBALAMIN 1000 MCG/ML IJ SOLN
1000.0000 ug | Freq: Once | INTRAMUSCULAR | Status: AC
  Administered 2012-11-28: 1000 ug via INTRAMUSCULAR

## 2012-12-22 ENCOUNTER — Other Ambulatory Visit: Payer: Self-pay

## 2012-12-22 MED ORDER — METOPROLOL SUCCINATE ER 25 MG PO TB24: ORAL_TABLET | ORAL | Status: DC

## 2013-01-02 ENCOUNTER — Ambulatory Visit: Payer: Medicare Other

## 2013-01-04 ENCOUNTER — Encounter: Payer: Self-pay | Admitting: Internal Medicine

## 2013-01-05 ENCOUNTER — Encounter: Payer: Self-pay | Admitting: Cardiology

## 2013-01-09 ENCOUNTER — Encounter: Payer: Self-pay | Admitting: Nurse Practitioner

## 2013-01-09 ENCOUNTER — Ambulatory Visit (INDEPENDENT_AMBULATORY_CARE_PROVIDER_SITE_OTHER): Payer: Medicare Other | Admitting: Nurse Practitioner

## 2013-01-09 VITALS — BP 130/70 | HR 88 | Ht 60.0 in | Wt 158.4 lb

## 2013-01-09 DIAGNOSIS — I259 Chronic ischemic heart disease, unspecified: Secondary | ICD-10-CM

## 2013-01-09 DIAGNOSIS — R079 Chest pain, unspecified: Secondary | ICD-10-CM

## 2013-01-09 LAB — BASIC METABOLIC PANEL
BUN: 10 mg/dL (ref 6–23)
CO2: 27 mEq/L (ref 19–32)
Calcium: 9.7 mg/dL (ref 8.4–10.5)
Chloride: 105 mEq/L (ref 96–112)
Creatinine, Ser: 0.9 mg/dL (ref 0.4–1.5)
GFR: 82.65 mL/min (ref >60.00)
Glucose, Bld: 80 mg/dL (ref 70–99)
Potassium: 4.1 mEq/L (ref 3.5–5.1)
Sodium: 141 mEq/L (ref 135–145)

## 2013-01-09 LAB — CBC WITH DIFFERENTIAL/PLATELET
Basophils Absolute: 0 10*3/uL (ref 0.0–0.1)
Basophils Relative: 0.4 % (ref 0.0–3.0)
Eosinophils Absolute: 0.1 10*3/uL (ref 0.0–0.7)
Eosinophils Relative: 0.9 % (ref 0.0–5.0)
HCT: 46.5 % (ref 39.0–52.0)
Hemoglobin: 15.6 g/dL (ref 13.0–17.0)
Lymphocytes Relative: 13.8 % (ref 12.0–46.0)
Lymphs Abs: 1 10*3/uL (ref 0.7–4.0)
MCHC: 33.5 g/dL (ref 30.0–36.0)
MCV: 84.1 fl (ref 78.0–100.0)
Monocytes Absolute: 1.8 10*3/uL — ABNORMAL HIGH (ref 0.1–1.0)
Monocytes Relative: 25.8 % — ABNORMAL HIGH (ref 3.0–12.0)
Neutro Abs: 4.2 10*3/uL (ref 1.4–7.7)
Neutrophils Relative %: 59.1 % (ref 43.0–77.0)
Platelets: 53 10*3/uL — ABNORMAL LOW (ref 150.0–400.0)
RBC: 5.53 Mil/uL (ref 4.22–5.81)
RDW: 16 % — ABNORMAL HIGH (ref 11.5–14.6)
WBC: 7.1 10*3/uL (ref 4.5–10.5)

## 2013-01-09 LAB — LIPID PANEL
Cholesterol: 181 mg/dL (ref 0–200)
HDL: 40.6 mg/dL (ref >39.00)
LDL Cholesterol: 115 mg/dL — ABNORMAL HIGH (ref 0–99)
Total CHOL/HDL Ratio: 4
Triglycerides: 128 mg/dL (ref 0.0–149.0)
VLDL: 25.6 mg/dL (ref 0.0–40.0)

## 2013-01-09 LAB — HEPATIC FUNCTION PANEL
ALT: 12 U/L (ref 0–53)
AST: 21 U/L (ref 0–37)
Albumin: 4.4 g/dL (ref 3.5–5.2)
Alkaline Phosphatase: 73 U/L (ref 39–117)
Bilirubin, Direct: 0.1 mg/dL (ref 0.0–0.3)
Total Bilirubin: 0.7 mg/dL (ref 0.3–1.2)
Total Protein: 6.4 g/dL (ref 6.0–8.3)

## 2013-01-09 MED ORDER — NITROGLYCERIN 0.4 MG SL SUBL: 0.4000 mg | SUBLINGUAL_TABLET | SUBLINGUAL | Status: DC | PRN

## 2013-01-09 MED ORDER — METOPROLOL SUCCINATE ER 25 MG PO TB24: ORAL_TABLET | ORAL | Status: DC

## 2013-01-09 NOTE — Progress Notes (Signed)
Sheilah Pigeon Date of Birth: 08-31-1933 Medical Record #[id number]  History of Present Illness: Mr. Reiling is seen back today for a follow up visit. Seen for Dr. Jens Som. He has CAD with cath in 2000 showing 60 to 70% proximal LAD, 70 to 80% small intermediate and 20% stenosis in mid RCA. LV function Normal. Last Myoview that I see is from 2008 and was normal without ischemia/infarction and EF of 78%. His other issues include OA, OSA, pernicious anemia maintained on monthly B12 injections and has a persistent mild leukopenia and thrombocytopenia with monocytosis felt to be secondary to MDS; cluster headaches and BPH.   Last seen in February of 2013. Felt to be doing ok but did endorse DOE.   Comes back today. Here alone. Doing ok. Quite talkative and difficult to keep on track with his history. Says he has been doing ok without issue. Then tells me that he had chest pain yesterday while over at the Cancer center with his wife. Wife has been diagnosed with pancreatic cancer. Had chest pain while in the waiting room - radiated up to his jaw - noted a metallic taste in his mouth - did take NTG x 1 and got relief. Sounds like this was the first time he has used NTG but then he tells me that he will have chest pain if he does not eat. Gets dizzy if he does not eat as well. No know diabetes. Seems very stressed about his wife. Not exercising due to bad knees. Still with DOE if he overexerts.   Current Outpatient Prescriptions  Medication Sig Dispense Refill  . aspirin 81 MG tablet Take 81 mg by mouth daily.      . celecoxib (CELEBREX) 200 MG capsule Take 1 capsule (200 mg total) by mouth as needed.  30 capsule  0  . co-enzyme Q-10 30 MG capsule Take 200 mg by mouth daily.      . cyanocobalamin (,VITAMIN B-12,) 1000 MCG/ML injection Inject 1 mL (1,000 mcg total) into the muscle every 30 (thirty) days.  1 mL  3  . doxazosin (CARDURA) 4 MG tablet Take 2 mg by mouth every morning.       . metoprolol  succinate (TOPROL-XL) 25 MG 24 hr tablet Take one half tablet daily  30 tablet  11  . nitroGLYCERIN (NITROSTAT) 0.4 MG SL tablet Place 1 tablet (0.4 mg total) under the tongue every 5 (five) minutes as needed. Up to 3 doses  25 tablet  11  . Omega-3 Fatty Acids (FISH OIL) 1000 MG CAPS Take 1,200 mg by mouth daily.      . Tamsulosin HCl (FLOMAX) 0.4 MG CAPS Take 0.4 mg by mouth daily after supper.       No current facility-administered medications for this visit.    Allergies  Allergen Reactions  . Penicillins Other (See Comments)    Whelps, passed out  . Sulfamethoxazole-Trimethoprim Rash  . Sulfonamide Derivatives Rash    Past Medical History  Diagnosis Date  . Coronary atherosclerosis of unspecified type of vessel, native or graft   . Personal history of colonic polyps     adenomatous  . Hypertrophy of prostate without urinary obstruction and other lower urinary tract symptoms (LUTS)   . Unspecified essential hypertension   . Other and unspecified hyperlipidemia   . Diverticulosis of colon (without mention of hemorrhage)   . Degeneration of intervertebral disc, site unspecified   . Cluster headache   . Pernicious anemia   .  Neurogenic dysphagia   . Osteoarthritis   . CHF (congestive heart failure)   . Unspecified sleep apnea     does not have cpap-was not able to tolerate  . Prostate enlargement     urinary frequency, nocturia  . Swallowing difficulty   . Thrombocytopenia, unspecified 09/01/2012    Past Surgical History  Procedure Laterality Date  . Inguinal herniorrhapy  2003  . Uvulectomy  1993  . Knee arthroscopy      5 times  . Total knee arthroplasty  2003  . Rotator cuff repair Right 2006  . Biopsy prostate  2008    Benign  . Tonsillectomy    . Total hip arthroplasty    . Hernia repair  2004    RIH  . Bone marrow biopsy  2011  . Nasal endoscopy  2012  . Cholecystectomy  11/24/2010    Procedure: LAPAROSCOPIC CHOLECYSTECTOMY WITH INTRAOPERATIVE  CHOLANGIOGRAM;  Surgeon: Velora Heckler, MD;  Location: [facility];  Service: General;  Laterality: N/A;  c-arm    History  Smoking status  . Former Smoker  . Types: Pipe  . Quit date: 01/18/1966  Smokeless tobacco  . Never Used    History  Alcohol Use No    Family History  Problem Relation Age of Onset  . Colon cancer Father   . Cancer Father     colon  . Peripheral vascular disease Mother     Review of Systems: The review of systems is per the HPI.  All other systems were reviewed and are negative.  Physical Exam: BP 130/70  Pulse 88  Ht 5' (1.524 m)  Wt 158 lb 6.4 oz (71.85 kg)  BMI 30.94 kg/m2 Patient is very pleasant and in no acute distress. Very talkative - hard to keep on track with his history taking. Tearful at times. He is overweight. Skin is warm and dry. Color is normal.  HEENT is unremarkable. Normocephalic/atraumatic. PERRL. Sclera are nonicteric. Neck is supple. No masses. No JVD. Lungs are clear. Cardiac exam shows a regular rate and rhythm. Abdomen is obese but soft. Extremities are without edema. Gait and ROM are intact. No gross neurologic deficits noted.  LABORATORY DATA: EKG today shows sinus - no acute changes - tracing unchanged from prior EKG  Lab Results  Component Value Date   WBC 5.2 09/01/2012   HGB 14.3 09/01/2012   HCT 42.8 09/01/2012   PLT 129* 09/01/2012   GLUCOSE 82 05/30/2012   CHOL 158 05/30/2012   TRIG 128.0 05/30/2012   HDL 35.00* 05/30/2012   LDLCALC 97 05/30/2012   ALT 11 05/30/2012   AST 14 05/30/2012   NA 143 05/30/2012   K 4.6 05/30/2012   CL 109 05/30/2012   CREATININE 0.9 05/30/2012   BUN 11 05/30/2012   CO2 29 05/30/2012   TSH 0.84 07/23/2010   PSA 6.25* 05/30/2012   INR 1.08 11/18/2010   HGBA1C 5.4 12/12/2007   ECHO SUMMARY FROM 2006 - Left ventricular ejection fraction was estimated to be 60 %. There were no left ventricular regional wall motion abnormalities. - There was mild mitral annular calcification. - There is good LV  function. There is some pulmonary hypertension. IMPRESSIONS  Assessment / Plan: 1. Known CAD - remote Myoview from 2008  - has had some recurrent chest pain - will update his Myoview  2. HTN - BP looks ok  3. HLD - on statin therapy - recheck his labs today  4. Situational stress with wife's illness -  this will be a difficult journey for him.   Will update his Myoview. Check labs today. Check EKG today. I have refilled his NTG and metoprolol today. Tentatively see him back in a year unless his study is abnormal.   Patient is agreeable to this plan and will call if any problems develop in the interim.   Rosalio Macadamia, RN, ANP-C [facility] [address] [place], [place]  [address]

## 2013-01-09 NOTE — Patient Instructions (Signed)
We will check labs today  We are going to arrange for a stress test (Lexiscan)  I have refilled your medicine today including your NTG  Try to be as active as possible  See Dr. Jens Som in a year - it will be sooner if your stress test is abnormal  Call the [facility] office at [phone] if you have any questions, problems or concerns.

## 2013-01-10 ENCOUNTER — Telehealth: Payer: Self-pay | Admitting: Oncology

## 2013-01-10 NOTE — Telephone Encounter (Signed)
S/W [facility] AT DR. ZOXWRUEA OFFICE AND GVE APPT FOR 12/30 @ 12:30. DUE TO THE PATIENT PLATELET COUNT

## 2013-01-12 ENCOUNTER — Ambulatory Visit (INDEPENDENT_AMBULATORY_CARE_PROVIDER_SITE_OTHER): Payer: Medicare Other

## 2013-01-12 DIAGNOSIS — E538 Deficiency of other specified B group vitamins: Secondary | ICD-10-CM

## 2013-01-12 MED ORDER — CYANOCOBALAMIN 1000 MCG/ML IJ SOLN
1000.0000 ug | Freq: Once | INTRAMUSCULAR | Status: AC
  Administered 2013-01-12: 1000 ug via INTRAMUSCULAR

## 2013-01-16 ENCOUNTER — Ambulatory Visit (HOSPITAL_BASED_OUTPATIENT_CLINIC_OR_DEPARTMENT_OTHER): Payer: Medicare Other

## 2013-01-16 ENCOUNTER — Telehealth: Payer: Self-pay | Admitting: *Deleted

## 2013-01-16 ENCOUNTER — Other Ambulatory Visit: Payer: Self-pay | Admitting: Dermatology

## 2013-01-16 ENCOUNTER — Ambulatory Visit (HOSPITAL_BASED_OUTPATIENT_CLINIC_OR_DEPARTMENT_OTHER): Payer: Medicare Other | Admitting: Oncology

## 2013-01-16 VITALS — BP 137/64 | HR 62 | Temp 98.5°F | Resp 18 | Ht 60.0 in | Wt 159.0 lb

## 2013-01-16 DIAGNOSIS — D696 Thrombocytopenia, unspecified: Secondary | ICD-10-CM

## 2013-01-16 DIAGNOSIS — D510 Vitamin B12 deficiency anemia due to intrinsic factor deficiency: Secondary | ICD-10-CM

## 2013-01-16 LAB — CBC WITH DIFFERENTIAL/PLATELET
BASO%: 0.2 % (ref 0.0–2.0)
EOS%: 0.6 % (ref 0.0–7.0)
HCT: 43.8 % (ref 38.4–49.9)
MCH: 27.7 pg (ref 27.2–33.4)
MCHC: 32.6 g/dL (ref 32.0–36.0)
MCV: 84.7 fL (ref 79.3–98.0)
MONO%: 29.5 % — ABNORMAL HIGH (ref 0.0–14.0)
NEUT#: 3.7 10*3/uL (ref 1.5–6.5)
RBC: 5.17 10*6/uL (ref 4.20–5.82)
RDW: 14.5 % (ref 11.0–14.6)

## 2013-01-16 LAB — MORPHOLOGY

## 2013-01-16 NOTE — Progress Notes (Signed)
Hematology and Oncology Follow Up Visit  [name] [id number] 10/13/1933 79 y.o. 01/16/2013 12:17 PM   Principle Diagnosis: Encounter Diagnoses  Name Primary?  . ANEMIA, PERNICIOUS Yes  . Thrombocytopenia, unspecified      Interim History:   Pleasant 79 year old man who was asked by his cardiologist to schedule a followup visit in view of recurrent fall in his platelet count. When I last saw him back in August of this year, platelet count appeared to be recovering after some medication changes and rose from lowest value recorded of 45,000 in February up to 127,000 in August. Count last week on December 23 down to 53,000. He initially presented in December 2010 with pancytopenia and macrocytic red cell indices. He was diagnosed with pernicious anemia and started on B12 replacement. Although he had normalization of his hemoglobin, white blood count and platelet count remained decreased. I did a bone marrow aspiration and biopsy 01/30/2009. Marrow was really not diagnostic with normal maturation of all cell lines. Cytogenetics studies were normal. In the absence of any other unifying diagnosis, I felt that he might have an early myelodysplastic syndrome. Over time, total white blood count normalized but he has a persistent monocytosis. Hemoglobin remains normal on B12 replacement.  He has had no bleeding or bruising. He has not been started any new medications that he recalls.  Unfortunately, his wife, age 29, was recently diagnosed with pancreatic cancer and is currently receiving chemotherapy and radiation therapy through our office.   Medications: reviewed  Allergies:  Allergies  Allergen Reactions  . Penicillins Other (See Comments)    Whelps, passed out  . Sulfamethoxazole-Trimethoprim Rash  . Sulfonamide Derivatives Rash    Review of Systems: Hematology:  No bleeding or bruising ENT ROS: No sore throat Breast ROS:  Respiratory ROS: No cough or dyspnea Cardiovascular  ROS:   Negative set of retrosternal chest discomfort last week lasting a few minutes Gastrointestinal ROS:  No abdominal pain or change in bowel habit Genito-Urinary ROS: Not questioned Musculoskeletal ROS: No muscle bone or joint pain Neurological ROS: No headache or change in vision Dermatological ROS: No rash Remaining ROS negative.  Physical Exam: Blood pressure 137/64, pulse 62, temperature 98.5 F (36.9 C), temperature source Oral, resp. rate 18, height 5' (1.524 m), weight 159 lb (72.122 kg), SpO2 98.00%. Wt Readings from Last 3 Encounters:  01/16/13 159 lb (72.122 kg)  01/09/13 158 lb 6.4 oz (71.85 kg)  09/01/12 154 lb 3.2 oz (69.945 kg)     General appearance: Well-nourished Caucasian man HENNT: Pharynx no erythema, exudate, mass, or ulcer. No thyromegaly or thyroid nodules Lymph nodes: No cervical, supraclavicular, or axillary lymphadenopathy Breasts Lungs: Clear to auscultation, resonant to percussion throughout Heart: Regular rhythm, no murmur, no gallop, no rub, no click, no edema Abdomen: Soft, nontender, normal bowel sounds, no mass, no organomegaly Extremities: No edema, no calf tenderness Musculoskeletal: no joint deformities GU:  Vascular: Carotid pulses 2+, no bruits,  Neurologic: Alert, oriented, PERRLA,   cranial nerves grossly normal, motor strength 5 over 5, reflexes 1+ symmetric, upper body coordination normal, gait normal, Skin: No rash or ecchymosis  Lab Results: CBC W/Diff: Platelet count today 138,000 (01/16/13)    Component Value Date/Time   WBC 7.1 01/09/2013 0854   WBC 5.2 09/01/2012 1102   RBC 5.53 01/09/2013 0854   RBC 5.21 09/01/2012 1102   RBC 4.21* 02/26/2009 1014   HGB 15.6 01/09/2013 0854   HGB 14.3 09/01/2012 1102   HCT 46.5 01/09/2013 0854  HCT 42.8 09/01/2012 1102   PLT 53.0* 01/09/2013 0854   PLT 129* 09/01/2012 1102   MCV 84.1 01/09/2013 0854   MCV 82.1 09/01/2012 1102   MCH 27.4 09/01/2012 1102   MCH 26.2 11/25/2010 0437   MCHC 33.5  01/09/2013 0854   MCHC 33.4 09/01/2012 1102   RDW 16.0* 01/09/2013 0854   RDW 14.1 09/01/2012 1102   LYMPHSABS 1.0 01/09/2013 0854   LYMPHSABS 0.9 09/01/2012 1102   MONOABS 1.8* 01/09/2013 0854   MONOABS 1.4* 09/01/2012 1102   EOSABS 0.1 01/09/2013 0854   EOSABS 0.0 09/01/2012 1102   BASOSABS 0.0 01/09/2013 0854   BASOSABS 0.0 09/01/2012 1102     Chemistry      Component Value Date/Time   NA 141 01/09/2013 0854   K 4.1 01/09/2013 0854   CL 105 01/09/2013 0854   CO2 27 01/09/2013 0854   BUN 10 01/09/2013 0854   CREATININE 0.9 01/09/2013 0854      Component Value Date/Time   CALCIUM 9.7 01/09/2013 0854   ALKPHOS 73 01/09/2013 0854   AST 21 01/09/2013 0854   ALT 12 01/09/2013 0854   BILITOT 0.7 01/09/2013 0854     Review of the peripheral blood film: Many large platelets some as large as red cells  .  Impression:  Spurious thrombocytopenia related to erroneous machine count secondary to a subpopulation a very large platelets No further evaluation necessary.   CC: Patient Care Team: Dianne Dun, MD as PCP - General (Family Medicine)   Levert Feinstein, MD 12/30/201412:17 PM

## 2013-01-16 NOTE — Telephone Encounter (Signed)
appts made and printed...td 

## 2013-01-17 ENCOUNTER — Telehealth: Payer: Self-pay | Admitting: *Deleted

## 2013-01-17 NOTE — Telephone Encounter (Signed)
Message copied by Sabino Snipes on Wed Jan 17, 2013 11:25 AM ------      Message from: Levert Feinstein      Created: Tue Jan 16, 2013  7:17 PM       Call pt: platelet count 138,000!  Large platelets are throwing off the machine count  - reason why some counts have come back lower. ------

## 2013-01-17 NOTE — Telephone Encounter (Signed)
Pt given information regarding platelet count per Dr Cyndie Chime.  Pt reports that Dr. Cyndie Chime discussed this with him.

## 2013-01-17 NOTE — Telephone Encounter (Signed)
Message copied by Sabino Snipes on Wed Jan 17, 2013 11:24 AM ------      Message from: Levert Feinstein      Created: Tue Jan 16, 2013  7:17 PM       Call pt: platelet count 138,000!  Large platelets are throwing off the machine count  - reason why some counts have come back lower. ------

## 2013-01-29 ENCOUNTER — Encounter: Payer: Self-pay | Admitting: Cardiology

## 2013-01-29 ENCOUNTER — Ambulatory Visit (HOSPITAL_COMMUNITY): Payer: Medicare Other | Attending: Cardiology | Admitting: Radiology

## 2013-01-29 VITALS — BP 149/95 | HR 90 | Ht 60.0 in | Wt 154.0 lb

## 2013-01-29 DIAGNOSIS — R0609 Other forms of dyspnea: Secondary | ICD-10-CM | POA: Insufficient documentation

## 2013-01-29 DIAGNOSIS — R079 Chest pain, unspecified: Secondary | ICD-10-CM | POA: Insufficient documentation

## 2013-01-29 DIAGNOSIS — I10 Essential (primary) hypertension: Secondary | ICD-10-CM | POA: Insufficient documentation

## 2013-01-29 DIAGNOSIS — I259 Chronic ischemic heart disease, unspecified: Secondary | ICD-10-CM

## 2013-01-29 DIAGNOSIS — R0989 Other specified symptoms and signs involving the circulatory and respiratory systems: Secondary | ICD-10-CM | POA: Insufficient documentation

## 2013-01-29 DIAGNOSIS — I2510 Atherosclerotic heart disease of native coronary artery without angina pectoris: Secondary | ICD-10-CM

## 2013-01-29 DIAGNOSIS — Z87891 Personal history of nicotine dependence: Secondary | ICD-10-CM | POA: Insufficient documentation

## 2013-01-29 MED ORDER — REGADENOSON 0.4 MG/5ML IV SOLN
0.4000 mg | Freq: Once | INTRAVENOUS | Status: AC
  Administered 2013-01-29: 0.4 mg via INTRAVENOUS

## 2013-01-29 MED ORDER — TECHNETIUM TC 99M SESTAMIBI GENERIC - CARDIOLITE
10.0000 | Freq: Once | INTRAVENOUS | Status: AC | PRN
  Administered 2013-01-29: 10 via INTRAVENOUS

## 2013-01-29 MED ORDER — TECHNETIUM TC 99M SESTAMIBI GENERIC - CARDIOLITE
30.0000 | Freq: Once | INTRAVENOUS | Status: AC | PRN
  Administered 2013-01-29: 30 via INTRAVENOUS

## 2013-01-29 NOTE — Progress Notes (Signed)
[name] [address] [place], [place] [address] [phone]    Cardiology Nuclear Med Study  [name] is a 80 y.o. male     MRN : [id number]     DOB: October 15, 1933  Procedure Date: 01/29/2013  Nuclear Med Background Indication for Stress Test:  Evaluation for Ischemia History:  CAD, Cath (mod. CAD), Echo 2006 EF 60%, MPI 2011 (normal) EF 85% Cardiac Risk Factors: History of Smoking and Hypertension  Symptoms:  Chest Pain and DOE   Nuclear Pre-Procedure Caffeine/Decaff Intake:  None NPO After: 8:00pm   Lungs:  clear O2 Sat: 98% on room air. IV 0.9% NS with Angio Cath:  22g  IV Site: R Hand  IV Started by:  Matilde Haymaker, RN  Chest Size (in):  42 Cup Size: n/a  Height: 5' (1.524 m)  Weight:  154 lb (69.854 kg)  BMI:  Body mass index is 30.08 kg/(m^2). Tech Comments:  No toprol x 24 hrs    Nuclear Med Study 1 or 2 day study: 1 day  Stress Test Type:  Carlton Adam  Reading MD: n/a  Order Authorizing Provider:  Queen Blossom and Kathrene Alu  Resting Radionuclide: Technetium 62m Sestamibi  Resting Radionuclide Dose: 11.0 mCi   Stress Radionuclide:  Technetium 24m Sestamibi  Stress Radionuclide Dose: 33.0 mCi           Stress Protocol Rest HR: 90 Stress HR: 94  Rest BP: 149/95 Stress BP: 120/87  Exercise Time (min): n/a METS: n/a           Dose of Adenosine (mg):  n/a Dose of Lexiscan: 0.4 mg  Dose of Atropine (mg): n/a Dose of Dobutamine: n/a mcg/kg/min (at max HR)  Stress Test Technologist: Glade Lloyd, BS-ES  Nuclear Technologist:  Charlton Amor, CNMT     Rest Procedure:  Myocardial perfusion imaging was performed at rest 45 minutes following the intravenous administration of Technetium 22m Sestamibi. Rest ECG: NSR with non-specific ST-T wave changes  Stress Procedure:  The patient received IV Lexiscan 0.4 mg over 15-seconds.  Technetium 56m Sestamibi injected at 30-seconds.  Quantitative spect images were  obtained after a 45 minute delay.  During the infusion of Lexiscan, the patient complained of slight SOB and dizziness.  These symptoms resolved in recovery.  Stress ECG: No significant change from baseline ECG  QPS Raw Data Images:  Normal; no motion artifact; normal heart/lung ratio. Stress Images:  Normal homogeneous uptake in all areas of the myocardium. Rest Images:  Normal homogeneous uptake in all areas of the myocardium. Subtraction (SDS):  No evidence of ischemia. Transient Ischemic Dilatation (Normal <1.22):  0.83 Lung/Heart Ratio (Normal <0.45):  0.36  Quantitative Gated Spect Images QGS EDV:  42 ml QGS ESV:  7 ml  Impression Exercise Capacity:  Lexiscan with no exercise. BP Response:  Normal blood pressure response. Clinical Symptoms:  No chest pain. ECG Impression:  No significant ST segment change suggestive of ischemia. Comparison with Prior Nuclear Study: No significant change from previous study  Overall Impression:  Normal stress nuclear study.  LV Ejection Fraction: 82%.  LV Wall Motion:  NL LV Function; NL Wall Motion  [employer]

## 2013-02-14 ENCOUNTER — Ambulatory Visit (INDEPENDENT_AMBULATORY_CARE_PROVIDER_SITE_OTHER): Payer: Medicare Other | Admitting: *Deleted

## 2013-02-14 DIAGNOSIS — E538 Deficiency of other specified B group vitamins: Secondary | ICD-10-CM

## 2013-02-14 MED ORDER — CYANOCOBALAMIN 1000 MCG/ML IJ SOLN
1000.0000 ug | Freq: Once | INTRAMUSCULAR | Status: AC
  Administered 2013-02-14: 1000 ug via INTRAMUSCULAR

## 2013-03-01 ENCOUNTER — Ambulatory Visit (AMBULATORY_SURGERY_CENTER): Payer: Medicare Other | Admitting: *Deleted

## 2013-03-01 VITALS — Ht 60.0 in | Wt 155.2 lb

## 2013-03-01 DIAGNOSIS — Z86010 Personal history of colonic polyps: Secondary | ICD-10-CM

## 2013-03-01 MED ORDER — MOVIPREP 100 G PO SOLR: ORAL | Status: DC

## 2013-03-01 NOTE — Progress Notes (Signed)
No allergies to eggs or soy. No problems with anesthesia.  

## 2013-03-06 ENCOUNTER — Encounter: Payer: Self-pay | Admitting: Internal Medicine

## 2013-03-12 ENCOUNTER — Ambulatory Visit (INDEPENDENT_AMBULATORY_CARE_PROVIDER_SITE_OTHER): Payer: Medicare Other | Admitting: Family Medicine

## 2013-03-12 ENCOUNTER — Encounter: Payer: Self-pay | Admitting: Family Medicine

## 2013-03-12 VITALS — BP 120/70 | HR 84 | Temp 97.8°F | Wt 155.5 lb

## 2013-03-12 DIAGNOSIS — Z6379 Other stressful life events affecting family and household: Secondary | ICD-10-CM

## 2013-03-12 DIAGNOSIS — R21 Rash and other nonspecific skin eruption: Secondary | ICD-10-CM

## 2013-03-12 LAB — CBC WITH DIFFERENTIAL/PLATELET
BASOS ABS: 0 10*3/uL (ref 0.0–0.1)
Basophils Relative: 0.1 % (ref 0.0–3.0)
Eosinophils Absolute: 0 10*3/uL (ref 0.0–0.7)
Eosinophils Relative: 0.6 % (ref 0.0–5.0)
HEMATOCRIT: 47.5 % (ref 39.0–52.0)
Hemoglobin: 15.4 g/dL (ref 13.0–17.0)
LYMPHS ABS: 0.8 10*3/uL (ref 0.7–4.0)
Lymphocytes Relative: 12.1 % (ref 12.0–46.0)
MCHC: 32.6 g/dL (ref 30.0–36.0)
MCV: 86 fl (ref 78.0–100.0)
Monocytes Absolute: 1.6 10*3/uL — ABNORMAL HIGH (ref 0.1–1.0)
Monocytes Relative: 25.2 % — ABNORMAL HIGH (ref 3.0–12.0)
Neutro Abs: 4.1 10*3/uL (ref 1.4–7.7)
Neutrophils Relative %: 62 % (ref 43.0–77.0)
PLATELETS: 92 10*3/uL — AB (ref 150.0–400.0)
RBC: 5.52 Mil/uL (ref 4.22–5.81)
RDW: 15.5 % — AB (ref 11.5–14.6)
WBC: 6.5 10*3/uL (ref 4.5–10.5)

## 2013-03-12 LAB — COMPREHENSIVE METABOLIC PANEL
ALT: 15 U/L (ref 0–53)
AST: 18 U/L (ref 0–37)
Albumin: 3.9 g/dL (ref 3.5–5.2)
Alkaline Phosphatase: 81 U/L (ref 39–117)
BUN: 9 mg/dL (ref 6–23)
CHLORIDE: 106 meq/L (ref 96–112)
CO2: 25 meq/L (ref 19–32)
Calcium: 9.2 mg/dL (ref 8.4–10.5)
Creatinine, Ser: 1.1 mg/dL (ref 0.4–1.5)
GFR: 71.14 mL/min (ref >60.00)
Glucose, Bld: 99 mg/dL (ref 70–99)
POTASSIUM: 4.2 meq/L (ref 3.5–5.1)
SODIUM: 141 meq/L (ref 135–145)
TOTAL PROTEIN: 6.2 g/dL (ref 6.0–8.3)
Total Bilirubin: 0.6 mg/dL (ref 0.3–1.2)

## 2013-03-12 NOTE — Assessment & Plan Note (Signed)
Etiology unclear. He wants me to get labs to make sure "nothing is wrong inside." He is having a colonoscopy tomorrow.  Advised to follow up with derm and consider allergist referral.  ? Rash related to psychological stressors at this time due to his wife illness.  Orders Placed This Encounter  Procedures  . Comprehensive metabolic panel  . CBC with Differential

## 2013-03-12 NOTE — Assessment & Plan Note (Signed)
Discussed psychotherapy- he declined because he feels he has a strong support system of family and friends.

## 2013-03-12 NOTE — Progress Notes (Signed)
Pre visit review using our clinic review tool, if applicable. No additional management support is needed unless otherwise documented below in the visit note. 

## 2013-03-12 NOTE — Patient Instructions (Signed)
Good to see you. We will call you with your lab work.  Consider following up with Dr. Ronnald Ramp or seeing an allergist ( I can refer you).

## 2013-03-12 NOTE — Progress Notes (Signed)
Subjective:   Patient ID: [name], male    DOB: 1933/08/29, 80 y.o.   MRN: [id number]  [name] is a pleasant 80 y.o. year old male who presents to clinic today with Rash  on 03/12/2013  HPI: About one month ago developed a very itchy rash- started on right leg- now on legs bilaterally, bilateral arms, back and chest.   Went to Dr. Ronnald Ramp (derm)- per pt was biopsied but not exactly sure what it is. Placed on topical clobetasol and oral vistaril for itching. Nothing has worked.  Has changed soaps but only after this started to see if it would help. No changes in laundry detergents or other products that he is aware of.  His wife is receiving treatment for pancreatic CA which has been difficult.  Has wonderful support from family and friends and strong faith ("it is in [employer]).  Denies any SI or HI.  Patient Active Problem List   Diagnosis Date Noted  . Rash and nonspecific skin eruption 03/12/2013  . Stress due to illness of family member 03/12/2013  . Thrombocytopenia, unspecified 09/01/2012  . Dysuria 08/07/2012  . DNR (do not resuscitate) 06/06/2012  . Choledocholithiasis with chronic cholecystitis 10/05/2010  . KNEE REPLACEMENT, RIGHT, HX OF 04/02/2010  . HIP REPLACEMENT, RIGHT, HX OF 06/20/2009  . ANEMIA, PERNICIOUS 01/03/2009  . C A D 12/12/2007  . COLONIC POLYPS, HX OF 12/12/2007  . HYPERLIPIDEMIA 06/14/2006  . HYPERTENSION 06/14/2006  . DIVERTICULOSIS, COLON 06/14/2006  . Elevated PSA, less than 10 ng/ml 06/14/2006  . DEGENERATIVE DISC DISEASE 06/14/2006  . SLEEP APNEA 06/14/2006  . TONSILLECTOMY AND ADENOIDECTOMY, HX OF 06/14/2006  . TOTAL KNEE REPLACEMENT, HX OF 06/14/2006   Past Medical History  Diagnosis Date  . Coronary atherosclerosis of unspecified type of vessel, native or graft   . Personal history of colonic polyps     adenomatous  . Hypertrophy of prostate without urinary obstruction and other lower urinary tract symptoms (LUTS)     . Unspecified essential hypertension   . Other and unspecified hyperlipidemia   . Diverticulosis of colon (without mention of hemorrhage)   . Degeneration of intervertebral disc, site unspecified   . Cluster headache   . Pernicious anemia   . Neurogenic dysphagia   . Osteoarthritis   . CHF (congestive heart failure)   . Unspecified sleep apnea     does not have cpap-was not able to tolerate  . Prostate enlargement     urinary frequency, nocturia  . Swallowing difficulty   . Thrombocytopenia, unspecified 09/01/2012   Past Surgical History  Procedure Laterality Date  . Inguinal herniorrhapy  2003  . Uvulectomy  1993  . Knee arthroscopy      5 times  . Total knee arthroplasty  2003  . Rotator cuff repair Right 2006  . Biopsy prostate  2008    Benign  . Tonsillectomy    . Total hip arthroplasty Right 2011  . Hernia repair  2004    RI[facility]  . Bone marrow biopsy  2011  . Nasal endoscopy  2012  . Cholecystectomy  11/24/2010    Procedure: LAPAROSCOPIC CHOLECYSTECTOMY WITH INTRAOPERATIVE CHOLANGIOGRAM;  Surgeon: Earnstine Regal, MD;  Location: [facility];  Service: General;  Laterality: N/A;  c-arm   History  Substance Use Topics  . Smoking status: Former Smoker    Types: Pipe    Quit date: 01/18/1966  . Smokeless tobacco: Never Used  . Alcohol Use: No  Family History  Problem Relation Age of Onset  . Colon cancer Father 69  . Cancer Father     colon  . Peripheral vascular disease Mother    Allergies  Allergen Reactions  . Penicillins Other (See Comments)    Whelps, passed out  . Sulfamethoxazole-Trimethoprim Rash  . Sulfonamide Derivatives Rash   Current Outpatient Prescriptions on File Prior to Visit  Medication Sig Dispense Refill  . aspirin 81 MG tablet Take 81 mg by mouth daily.      . celecoxib (CELEBREX) 200 MG capsule Take 1 capsule (200 mg total) by mouth as needed.  30 capsule  0  . co-enzyme Q-10 30 MG capsule Take 200 mg by mouth daily.      . cyanocobalamin  (,VITAMIN B-12,) 1000 MCG/ML injection Inject 1 mL (1,000 mcg total) into the muscle every 30 (thirty) days.  1 mL  3  . doxazosin (CARDURA) 4 MG tablet Take 2 mg by mouth every morning.       . metoprolol succinate (TOPROL-XL) 25 MG 24 hr tablet Take one half tablet daily  30 tablet  11  . MOVIPREP 100 G SOLR moviprep as directed. No substitutions  1 kit  0  . nitroGLYCERIN (NITROSTAT) 0.4 MG SL tablet Place 1 tablet (0.4 mg total) under the tongue every 5 (five) minutes as needed. Up to 3 doses  25 tablet  11  . Omega-3 Fatty Acids (FISH OIL) 1000 MG CAPS Take 1,200 mg by mouth daily.      . Tamsulosin HCl (FLOMAX) 0.4 MG CAPS Take 0.4 mg by mouth daily after supper.       No current facility-administered medications on file prior to visit.   The PMH, PSH, Social History, Family History, Medications, and allergies have been reviewed in [facility], and have been updated if relevant.    Review of Systems See HPI Denies any nausea, vomiting or abdominal pain    Objective:    BP 120/70  Pulse 84  Temp(Src) 97.8 F (36.6 C) (Oral)  Wt 155 lb 8 oz (70.534 kg)  SpO2 92%   Physical Exam  Constitutional: He is oriented to person, place, and time. He appears well-developed and well-nourished.  Neurological: He is alert and oriented to person, place, and time.  Skin: Skin is warm and dry. Rash noted.  ? urticaria  Psychiatric: He has a normal mood and affect. His behavior is normal. Judgment and thought content normal.          Assessment & Plan:   Rash and nonspecific skin eruption - Plan: Comprehensive metabolic panel, CBC with Differential No Follow-up on file.

## 2013-03-16 ENCOUNTER — Encounter: Payer: Medicare Other | Admitting: Internal Medicine

## 2013-03-19 ENCOUNTER — Encounter: Payer: Self-pay | Admitting: Oncology

## 2013-03-20 ENCOUNTER — Ambulatory Visit (INDEPENDENT_AMBULATORY_CARE_PROVIDER_SITE_OTHER): Payer: Medicare Other

## 2013-03-20 DIAGNOSIS — E538 Deficiency of other specified B group vitamins: Secondary | ICD-10-CM

## 2013-03-20 MED ORDER — CYANOCOBALAMIN 1000 MCG/ML IJ SOLN
1000.0000 ug | Freq: Once | INTRAMUSCULAR | Status: AC
  Administered 2013-03-20: 1000 ug via INTRAMUSCULAR

## 2013-04-24 ENCOUNTER — Ambulatory Visit: Payer: Medicare Other

## 2013-05-16 ENCOUNTER — Encounter: Payer: Medicare Other | Admitting: Internal Medicine

## 2013-06-22 ENCOUNTER — Ambulatory Visit (INDEPENDENT_AMBULATORY_CARE_PROVIDER_SITE_OTHER): Payer: Medicare Other

## 2013-06-22 DIAGNOSIS — E538 Deficiency of other specified B group vitamins: Secondary | ICD-10-CM

## 2013-06-22 MED ORDER — CYANOCOBALAMIN 1000 MCG/ML IJ SOLN
1000.0000 ug | Freq: Once | INTRAMUSCULAR | Status: AC
  Administered 2013-06-22: 1000 ug via INTRAMUSCULAR

## 2013-06-26 ENCOUNTER — Encounter: Payer: Self-pay | Admitting: Oncology

## 2013-06-26 ENCOUNTER — Ambulatory Visit (INDEPENDENT_AMBULATORY_CARE_PROVIDER_SITE_OTHER): Payer: Medicare Other | Admitting: Oncology

## 2013-06-26 VITALS — BP 109/66 | HR 69 | Temp 98.6°F | Resp 20 | Ht 61.0 in | Wt 150.1 lb

## 2013-06-26 DIAGNOSIS — D696 Thrombocytopenia, unspecified: Secondary | ICD-10-CM

## 2013-06-26 DIAGNOSIS — D510 Vitamin B12 deficiency anemia due to intrinsic factor deficiency: Secondary | ICD-10-CM

## 2013-06-26 DIAGNOSIS — D72821 Monocytosis (symptomatic): Secondary | ICD-10-CM

## 2013-06-26 DIAGNOSIS — L989 Disorder of the skin and subcutaneous tissue, unspecified: Secondary | ICD-10-CM

## 2013-06-26 DIAGNOSIS — I2510 Atherosclerotic heart disease of native coronary artery without angina pectoris: Secondary | ICD-10-CM

## 2013-06-26 LAB — CBC WITH DIFFERENTIAL/PLATELET
Basophils Absolute: 0 10*3/uL (ref 0.0–0.1)
Basophils Relative: 0 % (ref 0–1)
EOS ABS: 0.1 10*3/uL (ref 0.0–0.7)
Eosinophils Relative: 1 % (ref 0–5)
HCT: 47 % (ref 39.0–52.0)
Hemoglobin: 16 g/dL (ref 13.0–17.0)
Lymphocytes Relative: 15 % (ref 12–46)
Lymphs Abs: 0.9 10*3/uL (ref 0.7–4.0)
MCH: 28.8 pg (ref 26.0–34.0)
MCHC: 34 g/dL (ref 30.0–36.0)
MCV: 84.5 fL (ref 78.0–100.0)
Monocytes Absolute: 1.8 10*3/uL — ABNORMAL HIGH (ref 0.1–1.0)
Monocytes Relative: 31 % — ABNORMAL HIGH (ref 3–12)
NEUTROS ABS: 3.1 10*3/uL (ref 1.7–7.7)
NEUTROS PCT: 53 % (ref 43–77)
PLATELETS: 199 10*3/uL (ref 150–400)
RBC: 5.56 MIL/uL (ref 4.22–5.81)
RDW: 14.3 % (ref 11.5–15.5)
WBC: 5.9 10*3/uL (ref 4.0–10.5)

## 2013-06-26 NOTE — Progress Notes (Signed)
Patient ID: [name], male   DOB: 1933-06-26, 80 y.o.   MRN: [id number] Hematology and Oncology Follow Up Visit  DILLARD PASCAL [id number] 02/04/1933 80 y.o. 06/26/2013 11:49 AM   Principle Diagnosis: Encounter Diagnoses  Name Primary?  . ANEMIA, PERNICIOUS Yes  . Thrombocytopenia, unspecified      Interim History:   Followup visit for this pleasant 80 year old man initially referred for evaluation of pancytopenia with macrocytic anemia in December 2010. He was diagnosed with pernicious anemia and started on B12 replacement. He had dramatic improvement in his hemoglobin but white count and platelet count remained borderline decreased. He had a peripheral blood monocytosis. Bone marrow aspiration and biopsy done 01/30/2009 to exclude a myelodysplastic syndrome. Marrow hypercellular for age but otherwise normal. No increased blasts. No ring sideroblasts. Normal cytogenetics. Over time white count normalized but there is a persistent monocytosis. Hemoglobin remains normal on B12 replacement. Platelet counts have fluctuated as low as 53,000 but this turned out to be a spurious. When blood is obtained in a citrate anticoagulant, platelets are normal. Today's value 199,000. Hemoglobin 16. Hematocrit 47%. White count 5900. 53% neutrophils, 15 lymphocytes, 31 monocytes.  He is grieving the death of his wife for 10 years who recently died of locally advanced pancreatic cancer. He does have family support here in [place]. He developed what he was told was an urticarial rash which he thought was due to a stress reaction. He was seen by a dermatologist and put on topical steroids. He states that they have not helped.   Medications: reviewed  Allergies:  Allergies  Allergen Reactions  . Penicillins Other (See Comments)    Whelps, passed out  . Sulfamethoxazole-Trimethoprim Rash  . Sulfonamide Derivatives Rash    Review of Systems: Hematology:  No bleeding or bruising ENT ROS:  No sore  throat Breast ROS:  Respiratory ROS: No cough or dyspnea Cardiovascular ROS:  No chest pain or palpitations Gastrointestinal ROS: No change in bowel habit   Genito-Urinary ROS: Not questioned Musculoskeletal ROS: No muscle bone or joint pain Neurological ROS: No headache or change in vision Dermatological ROS: He had what he thought were hives as a stress reaction to his wife's death. He did see a dermatologist. Topical steroid creams prescribed but didn't help. See physical exam below Remaining ROS negative:   Physical Exam: Blood pressure 109/66, pulse 69, temperature 98.6 F (37 C), temperature source Oral, resp. rate 20, height 5' 1"  (1.549 m), weight 150 lb 1.6 oz (68.085 kg), SpO2 98.00%. Wt Readings from Last 3 Encounters:  06/26/13 150 lb 1.6 oz (68.085 kg)  03/12/13 155 lb 8 oz (70.534 kg)  03/01/13 155 lb 3.2 oz (70.398 kg)     General appearance: Well-nourished Caucasian man HENNT: Pharynx no erythema, exudate, mass, or ulcer. No thyromegaly or thyroid nodules Lymph nodes: No cervical, supraclavicular, or axillary lymphadenopathy Breasts: Lungs: Clear to auscultation, resonant to percussion throughout Heart: Regular rhythm, no murmur, no gallop, no rub, no click, no edema Abdomen: Soft, nontender, normal bowel sounds, no mass, no organomegaly Extremities: No edema, no calf tenderness Musculoskeletal: no joint deformities GU:  Vascular: Carotid pulses 2+, no bruits, distal pulses: Dorsalis pedis 1+ symmetric Neurologic: Alert, oriented, PERRLA,  cranial nerves grossly normal, motor strength 5 over 5, reflexes 1+ symmetric, upper body coordination normal, gait normal, Skin: No rash or ecchymosis.  There are numerous, nodular, raised, lesions posterior right calf  Lab Results: CBC W/Diff    Component Value Date/Time   WBC 5.9  06/26/2013 1007   WBC 6.3 01/16/2013 1225   RBC 5.56 06/26/2013 1007   RBC 5.17 01/16/2013 1225   RBC 4.21* 02/26/2009 1014   HGB 16.0 06/26/2013 1007    HGB 14.3 01/16/2013 1225   HCT 47.0 06/26/2013 1007   HCT 43.8 01/16/2013 1225   PLT 199 06/26/2013 1007   PLT 138* 01/16/2013 1225   MCV 84.5 06/26/2013 1007   MCV 84.7 01/16/2013 1225   MCH 28.8 06/26/2013 1007   MCH 27.7 01/16/2013 1225   MCHC 34.0 06/26/2013 1007   MCHC 32.6 01/16/2013 1225   RDW 14.3 06/26/2013 1007   RDW 14.5 01/16/2013 1225   LYMPHSABS 0.9 06/26/2013 1007   LYMPHSABS 0.7* 01/16/2013 1225   MONOABS 1.8* 06/26/2013 1007   MONOABS 1.9* 01/16/2013 1225   EOSABS 0.1 06/26/2013 1007   EOSABS 0.0 01/16/2013 1225   BASOSABS 0.0 06/26/2013 1007   BASOSABS 0.0 01/16/2013 1225     Chemistry      Component Value Date/Time   NA 141 03/12/2013 1029   K 4.2 03/12/2013 1029   CL 106 03/12/2013 1029   CO2 25 03/12/2013 1029   BUN 9 03/12/2013 1029   CREATININE 1.1 03/12/2013 1029      Component Value Date/Time   CALCIUM 9.2 03/12/2013 1029   ALKPHOS 81 03/12/2013 1029   AST 18 03/12/2013 1029   ALT 15 03/12/2013 1029   BILITOT 0.6 03/12/2013 1029       Impression:  #1. Pernicious anemia. Complete response to B12 replacement. Continue the same.  #2. Unexplained monocytosis with normal bone marrow biopsy.  #3. Spurious thrombocytopenia  #4. Nodular skin lesions posterior right calf. These are suspicious for basal cell or squamous cell carcinomas. He is advised to reschedule with his dermatologist.  #5. History of elevated PSA followed by urology  #6. Coronary artery disease Recent stress Myoview study 01/29/2013 was normal. No areas of ischemia. Estimated left ventricular ejection fraction 82% with normal wall motion.   CC: Patient Care Team: Lucille Passy, MD as PCP - General (Family Medicine)   Annia Belt, MD 6/9/201511:49 AM

## 2013-06-26 NOTE — Patient Instructions (Signed)
Lab today Return in 6 months  Lab 1 week before visit 12/8 See Dr Beryle Beams 12/15

## 2013-06-27 ENCOUNTER — Telehealth: Payer: Self-pay | Admitting: *Deleted

## 2013-06-27 NOTE — Telephone Encounter (Signed)
Pt notified that his blood count was normal, per Dr Beryle Beams. Pt verbalized an understanding of this info and said thank you.

## 2013-07-01 ENCOUNTER — Inpatient Hospital Stay (HOSPITAL_COMMUNITY)
Admission: EM | Admit: 2013-07-01 | Discharge: 2013-07-05 | DRG: 445 | Disposition: A | Discharge status: Home / Self Care | Payer: Medicare Other | Attending: Internal Medicine | Admitting: Internal Medicine

## 2013-07-01 ENCOUNTER — Emergency Department (HOSPITAL_COMMUNITY): Payer: Medicare Other

## 2013-07-01 ENCOUNTER — Encounter (HOSPITAL_COMMUNITY): Payer: Self-pay | Admitting: Emergency Medicine

## 2013-07-01 ENCOUNTER — Inpatient Hospital Stay (HOSPITAL_COMMUNITY): Payer: Medicare Other

## 2013-07-01 DIAGNOSIS — N400 Enlarged prostate without lower urinary tract symptoms: Secondary | ICD-10-CM | POA: Diagnosis present

## 2013-07-01 DIAGNOSIS — I10 Essential (primary) hypertension: Secondary | ICD-10-CM | POA: Diagnosis present

## 2013-07-01 DIAGNOSIS — R740 Nonspecific elevation of levels of transaminase and lactic acid dehydrogenase [LDH]: Secondary | ICD-10-CM

## 2013-07-01 DIAGNOSIS — R945 Abnormal results of liver function studies: Secondary | ICD-10-CM | POA: Diagnosis present

## 2013-07-01 DIAGNOSIS — Z800 Family history of malignant neoplasm of digestive organs: Secondary | ICD-10-CM

## 2013-07-01 DIAGNOSIS — R7402 Elevation of levels of lactic acid dehydrogenase (LDH): Secondary | ICD-10-CM

## 2013-07-01 DIAGNOSIS — R300 Dysuria: Secondary | ICD-10-CM

## 2013-07-01 DIAGNOSIS — I2510 Atherosclerotic heart disease of native coronary artery without angina pectoris: Secondary | ICD-10-CM | POA: Diagnosis present

## 2013-07-01 DIAGNOSIS — K5730 Diverticulosis of large intestine without perforation or abscess without bleeding: Secondary | ICD-10-CM

## 2013-07-01 DIAGNOSIS — K8051 Calculus of bile duct without cholangitis or cholecystitis with obstruction: Principal | ICD-10-CM | POA: Diagnosis present

## 2013-07-01 DIAGNOSIS — R109 Unspecified abdominal pain: Secondary | ICD-10-CM

## 2013-07-01 DIAGNOSIS — Z860100 Personal history of colon polyps, unspecified: Secondary | ICD-10-CM

## 2013-07-01 DIAGNOSIS — Z87891 Personal history of nicotine dependence: Secondary | ICD-10-CM

## 2013-07-01 DIAGNOSIS — K838 Other specified diseases of biliary tract: Secondary | ICD-10-CM | POA: Diagnosis present

## 2013-07-01 DIAGNOSIS — R748 Abnormal levels of other serum enzymes: Secondary | ICD-10-CM

## 2013-07-01 DIAGNOSIS — N390 Urinary tract infection, site not specified: Secondary | ICD-10-CM | POA: Diagnosis present

## 2013-07-01 DIAGNOSIS — Z7982 Long term (current) use of aspirin: Secondary | ICD-10-CM

## 2013-07-01 DIAGNOSIS — I509 Heart failure, unspecified: Secondary | ICD-10-CM | POA: Diagnosis present

## 2013-07-01 DIAGNOSIS — K859 Acute pancreatitis without necrosis or infection, unspecified: Secondary | ICD-10-CM | POA: Diagnosis not present

## 2013-07-01 DIAGNOSIS — G4730 Sleep apnea, unspecified: Secondary | ICD-10-CM | POA: Diagnosis present

## 2013-07-01 DIAGNOSIS — E785 Hyperlipidemia, unspecified: Secondary | ICD-10-CM

## 2013-07-01 DIAGNOSIS — Z79899 Other long term (current) drug therapy: Secondary | ICD-10-CM

## 2013-07-01 DIAGNOSIS — Z96649 Presence of unspecified artificial hip joint: Secondary | ICD-10-CM

## 2013-07-01 DIAGNOSIS — N200 Calculus of kidney: Secondary | ICD-10-CM | POA: Diagnosis present

## 2013-07-01 DIAGNOSIS — R7989 Other specified abnormal findings of blood chemistry: Secondary | ICD-10-CM | POA: Diagnosis present

## 2013-07-01 DIAGNOSIS — Z66 Do not resuscitate: Secondary | ICD-10-CM | POA: Diagnosis present

## 2013-07-01 DIAGNOSIS — Z96659 Presence of unspecified artificial knee joint: Secondary | ICD-10-CM

## 2013-07-01 DIAGNOSIS — Z6828 Body mass index (BMI) 28.0-28.9, adult: Secondary | ICD-10-CM

## 2013-07-01 DIAGNOSIS — D696 Thrombocytopenia, unspecified: Secondary | ICD-10-CM | POA: Diagnosis present

## 2013-07-01 LAB — URINALYSIS, ROUTINE W REFLEX MICROSCOPIC
BILIRUBIN URINE: NEGATIVE
Glucose, UA: NEGATIVE mg/dL
Hgb urine dipstick: NEGATIVE
KETONES UR: NEGATIVE mg/dL
Nitrite: NEGATIVE
Protein, ur: NEGATIVE mg/dL
Specific Gravity, Urine: 1.013 (ref 1.005–1.030)
UROBILINOGEN UA: 1 mg/dL (ref 0.0–1.0)
pH: 5.5 (ref 5.0–8.0)

## 2013-07-01 LAB — TRIGLYCERIDES: Triglycerides: 48 mg/dL (ref <150)

## 2013-07-01 LAB — URINE MICROSCOPIC-ADD ON

## 2013-07-01 LAB — COMPREHENSIVE METABOLIC PANEL
ALK PHOS: 152 U/L — AB (ref 39–117)
ALT: 505 U/L — AB (ref 0–53)
AST: 1054 U/L — ABNORMAL HIGH (ref 0–37)
Albumin: 3.8 g/dL (ref 3.5–5.2)
BUN: 12 mg/dL (ref 6–23)
CALCIUM: 9.2 mg/dL (ref 8.4–10.5)
CO2: 25 meq/L (ref 19–32)
Chloride: 104 mEq/L (ref 96–112)
Creatinine, Ser: 0.8 mg/dL (ref 0.50–1.35)
GFR calc Af Amer: >90 mL/min (ref >90)
GFR, EST NON AFRICAN AMERICAN: 84 mL/min — AB (ref >90)
GLUCOSE: 139 mg/dL — AB (ref 70–99)
Potassium: 3.9 mEq/L (ref 3.7–5.3)
SODIUM: 143 meq/L (ref 137–147)
Total Bilirubin: 2.6 mg/dL — ABNORMAL HIGH (ref 0.3–1.2)
Total Protein: 6.1 g/dL (ref 6.0–8.3)

## 2013-07-01 LAB — CBC
HEMATOCRIT: 45.1 % (ref 39.0–52.0)
HEMOGLOBIN: 15.2 g/dL (ref 13.0–17.0)
MCH: 28 pg (ref 26.0–34.0)
MCHC: 33.7 g/dL (ref 30.0–36.0)
MCV: 83.2 fL (ref 78.0–100.0)
Platelets: 117 10*3/uL — ABNORMAL LOW (ref 150–400)
RBC: 5.42 MIL/uL (ref 4.22–5.81)
RDW: 14.4 % (ref 11.5–15.5)
WBC: 9.5 10*3/uL (ref 4.0–10.5)

## 2013-07-01 LAB — LIPASE, BLOOD: Lipase: 273 U/L — ABNORMAL HIGH (ref 11–59)

## 2013-07-01 LAB — I-STAT TROPONIN, ED: Troponin i, poc: 0 ng/mL (ref 0.00–0.08)

## 2013-07-01 LAB — LACTATE DEHYDROGENASE: LDH: 778 U/L — AB (ref 94–250)

## 2013-07-01 MED ORDER — MORPHINE SULFATE 2 MG/ML IJ SOLN
2.0000 mg | Freq: Once | INTRAMUSCULAR | Status: AC
  Administered 2013-07-01: 2 mg via INTRAVENOUS
  Filled 2013-07-01: qty 1

## 2013-07-01 MED ORDER — METOPROLOL SUCCINATE 12.5 MG HALF TABLET
12.5000 mg | ORAL_TABLET | Freq: Every day | ORAL | Status: DC
  Administered 2013-07-01 – 2013-07-05 (×5): 12.5 mg via ORAL
  Filled 2013-07-01 (×5): qty 1

## 2013-07-01 MED ORDER — ONDANSETRON HCL 4 MG PO TABS: 4.0000 mg | ORAL_TABLET | Freq: Four times a day (QID) | ORAL | Status: DC | PRN

## 2013-07-01 MED ORDER — SODIUM CHLORIDE 0.9 % IV SOLN
INTRAVENOUS | Status: DC
Start: 2013-07-01 — End: 2013-07-05
  Administered 2013-07-01 – 2013-07-04 (×5): via INTRAVENOUS

## 2013-07-01 MED ORDER — DEXTROSE 5 % IV SOLN: 1.0000 g | INTRAVENOUS | Status: DC

## 2013-07-01 MED ORDER — ONDANSETRON HCL 4 MG/2ML IJ SOLN
4.0000 mg | Freq: Once | INTRAMUSCULAR | Status: AC
  Administered 2013-07-01: 4 mg via INTRAVENOUS
  Filled 2013-07-01: qty 2

## 2013-07-01 MED ORDER — DEXTROSE 5 % IV SOLN
1.0000 g | INTRAVENOUS | Status: DC
  Administered 2013-07-02 – 2013-07-05 (×4): 1 g via INTRAVENOUS
  Filled 2013-07-01 (×4): qty 10

## 2013-07-01 MED ORDER — PANTOPRAZOLE SODIUM 40 MG IV SOLR
40.0000 mg | Freq: Two times a day (BID) | INTRAVENOUS | Status: DC
  Administered 2013-07-01 – 2013-07-04 (×8): 40 mg via INTRAVENOUS
  Filled 2013-07-01 (×10): qty 40

## 2013-07-01 MED ORDER — FAMOTIDINE IN NACL 20-0.9 MG/50ML-% IV SOLN
20.0000 mg | Freq: Once | INTRAVENOUS | Status: AC
  Administered 2013-07-01: 20 mg via INTRAVENOUS
  Filled 2013-07-01: qty 50

## 2013-07-01 MED ORDER — TAMSULOSIN HCL 0.4 MG PO CAPS
0.4000 mg | ORAL_CAPSULE | Freq: Every day | ORAL | Status: DC
  Administered 2013-07-01 – 2013-07-04 (×4): 0.4 mg via ORAL
  Filled 2013-07-01 (×5): qty 1

## 2013-07-01 MED ORDER — IOHEXOL 300 MG/ML  SOLN
50.0000 mL | Freq: Once | INTRAMUSCULAR | Status: AC | PRN
  Administered 2013-07-01: 50 mL via ORAL

## 2013-07-01 MED ORDER — SODIUM CHLORIDE 0.9 % IV BOLUS (SEPSIS)
1000.0000 mL | Freq: Once | INTRAVENOUS | Status: AC
  Administered 2013-07-01: 1000 mL via INTRAVENOUS

## 2013-07-01 MED ORDER — ACETAMINOPHEN 325 MG PO TABS
650.0000 mg | ORAL_TABLET | Freq: Four times a day (QID) | ORAL | Status: DC | PRN
  Administered 2013-07-02: 650 mg via ORAL
  Filled 2013-07-01 (×2): qty 2

## 2013-07-01 MED ORDER — CALCIUM CARBONATE ANTACID 500 MG PO CHEW
2.0000 | CHEWABLE_TABLET | Freq: Once | ORAL | Status: AC
  Administered 2013-07-01: 400 mg via ORAL
  Filled 2013-07-01: qty 1

## 2013-07-01 MED ORDER — ONDANSETRON HCL 4 MG/2ML IJ SOLN
4.0000 mg | Freq: Four times a day (QID) | INTRAMUSCULAR | Status: DC | PRN
  Administered 2013-07-03: 4 mg via INTRAVENOUS
  Filled 2013-07-01: qty 2

## 2013-07-01 MED ORDER — MORPHINE SULFATE 2 MG/ML IJ SOLN: 0.5000 mg | INTRAMUSCULAR | Status: DC | PRN

## 2013-07-01 MED ORDER — CLOBETASOL PROPIONATE 0.05 % EX CREA
1.0000 "application " | TOPICAL_CREAM | Freq: Two times a day (BID) | CUTANEOUS | Status: DC
  Administered 2013-07-01 – 2013-07-04 (×7): 1 via TOPICAL
  Filled 2013-07-01: qty 15

## 2013-07-01 MED ORDER — ASPIRIN EC 81 MG PO TBEC
81.0000 mg | DELAYED_RELEASE_TABLET | Freq: Every day | ORAL | Status: DC
  Administered 2013-07-01 – 2013-07-05 (×5): 81 mg via ORAL
  Filled 2013-07-01 (×5): qty 1

## 2013-07-01 MED ORDER — ACETAMINOPHEN 650 MG RE SUPP: 650.0000 mg | Freq: Four times a day (QID) | RECTAL | Status: DC | PRN

## 2013-07-01 MED ORDER — DEXTROSE 5 % IV SOLN
1.0000 g | Freq: Once | INTRAVENOUS | Status: AC
  Administered 2013-07-01: 1 g via INTRAVENOUS
  Filled 2013-07-01: qty 10

## 2013-07-01 MED ORDER — HYDROCODONE-ACETAMINOPHEN 5-325 MG PO TABS: 1.0000 | ORAL_TABLET | ORAL | Status: DC | PRN

## 2013-07-01 MED ORDER — DOXAZOSIN MESYLATE 2 MG PO TABS
2.0000 mg | ORAL_TABLET | Freq: Every evening | ORAL | Status: DC
  Administered 2013-07-01 – 2013-07-04 (×4): 2 mg via ORAL
  Filled 2013-07-01 (×5): qty 1

## 2013-07-01 MED ORDER — IOHEXOL 300 MG/ML  SOLN
100.0000 mL | Freq: Once | INTRAMUSCULAR | Status: AC | PRN
  Administered 2013-07-01: 100 mL via INTRAVENOUS

## 2013-07-01 NOTE — ED Notes (Signed)
Admitting MD at bedside.

## 2013-07-01 NOTE — ED Notes (Signed)
Feels like a band above navel area

## 2013-07-01 NOTE — Progress Notes (Signed)
Daughter wants hospitalist to notify Dr Olevia Perches Rudolpho Sevin, about patient's admission.DR Tyrell Antonio made aware,

## 2013-07-01 NOTE — ED Notes (Signed)
Patient transported to CT 

## 2013-07-01 NOTE — ED Provider Notes (Signed)
CSN: [id number]     Arrival date & time 07/01/13  0201 History   First MD Initiated Contact with Patient 07/01/13 0423     Chief Complaint  Patient presents with  . Abdominal Pain  . Nausea     (Consider location/radiation/quality/duration/timing/severity/associated sxs/prior Treatment) HPI Patient is a 80 yo man with  Multiple chronic medical problems including BPH, CAD, pernicious anemia and history of thrombocytopenia.  He presents with complaint of nausea and intermittent and diffuse lower abdominal pain. Pain describes pain as aching and cramping and associated with nausea. Sx began around 4am yesterday, June 13. Patient has associated nausea and vomited once - here in the ED.    Pain seemed to be aggravated with eating food. No fever. NO diarrhea. Last BM was yesterday am. Patient is passing flatus. He denies GU sx. His only previous abd surgery is remote lap cholecystectomy.   Past Medical History  Diagnosis Date  . Coronary atherosclerosis of unspecified type of vessel, native or graft   . Personal history of colonic polyps     adenomatous  . Hypertrophy of prostate without urinary obstruction and other lower urinary tract symptoms (LUTS)   . Unspecified essential hypertension   . Other and unspecified hyperlipidemia   . Diverticulosis of colon (without mention of hemorrhage)   . Degeneration of intervertebral disc, site unspecified   . Cluster headache   . Pernicious anemia   . Neurogenic dysphagia   . Osteoarthritis   . CHF (congestive heart failure)   . Unspecified sleep apnea     does not have cpap-was not able to tolerate  . Prostate enlargement     urinary frequency, nocturia  . Swallowing difficulty   . Thrombocytopenia, unspecified 09/01/2012   Past Surgical History  Procedure Laterality Date  . Inguinal herniorrhapy  2003  . Uvulectomy  1993  . Knee arthroscopy      5 times  . Total knee arthroplasty  2003  . Rotator cuff repair Right 2006  . Biopsy  prostate  2008    Benign  . Tonsillectomy    . Total hip arthroplasty Right 2011  . Hernia repair  2004    [facility]H  . Bone marrow biopsy  2011  . Nasal endoscopy  2012  . Cholecystectomy  11/24/2010    Procedure: LAPAROSCOPIC CHOLECYSTECTOMY WITH INTRAOPERATIVE CHOLANGIOGRAM;  Surgeon: Earnstine Regal, MD;  Location: [facility];  Service: General;  Laterality: N/A;  c-arm   Family History  Problem Relation Age of Onset  . Colon cancer Father 26  . Cancer Father     colon  . Peripheral vascular disease Mother    History  Substance Use Topics  . Smoking status: Former Smoker    Types: Pipe    Quit date: 01/18/1966  . Smokeless tobacco: Never Used  . Alcohol Use: No    Review of Systems Ten point review of symptoms performed and is negative with the exception of symptoms noted above.     Allergies  Penicillins; Sulfamethoxazole-trimethoprim; and Sulfonamide derivatives  Home Medications   Prior to Admission medications   Medication Sig Start Date End Date Taking? Authorizing Provider  aspirin EC 81 MG tablet Take 81 mg by mouth daily.   Yes Historical Provider, MD  calcium carbonate (TUMS - DOSED IN MG ELEMENTAL CALCIUM) 500 MG chewable tablet Chew 2 tablets by mouth once.   Yes Historical Provider, MD  celecoxib (CELEBREX) 200 MG capsule Take 200 mg by mouth 2 (two) times daily as  needed (for pain.).   Yes Historical Provider, MD  clobetasol cream (TEMOVATE) 1.65 % Apply 1 application topically 2 (two) times daily. For whelps/itching   Yes Historical Provider, MD  co-enzyme Q-10 30 MG capsule Take 200 mg by mouth daily.   Yes Historical Provider, MD  cyanocobalamin (,VITAMIN B-12,) 1000 MCG/ML injection Inject 1 mL (1,000 mcg total) into the muscle every 30 (thirty) days. 06/02/12  Yes Lucille Passy, MD  doxazosin (CARDURA) 4 MG tablet Take 2 mg by mouth every evening.    Yes Historical Provider, MD  metoprolol succinate (TOPROL-XL) 25 MG 24 hr tablet Take 12.5 mg by mouth daily.   Yes  Historical Provider, MD  nitroGLYCERIN (NITROSTAT) 0.4 MG SL tablet Place 1 tablet (0.4 mg total) under the tongue every 5 (five) minutes as needed. Up to 3 doses 01/09/13  Yes Burtis Junes, NP  Omega-3 Fatty Acids (FISH OIL) 1200 MG CAPS Take 1,200 mg by mouth daily.   Yes Historical Provider, MD  Tamsulosin HCl (FLOMAX) 0.4 MG CAPS Take 0.4 mg by mouth daily after supper.   Yes Historical Provider, MD   BP 137/61  Pulse 95  Temp(Src) 100.5 F (38.1 C) (Oral)  Resp 18  Ht 5' 1"  (1.549 m)  Wt 150 lb (68.04 kg)  BMI 28.36 kg/m2  SpO2 94% Physical Exam Gen: well developed and well nourished appearing Head: NCAT Eyes: PERL, EOMI Nose: no epistaixis or rhinorrhea Mouth/throat: mucosa is moist and pink Neck: supple, no stridor Lungs: CTA B, no wheezing, rhonchi or rales CV: RRR, no murmur, extremities appear well perfused.  Abd: soft, notender, nondistended Back: no ttp, no cva ttp Skin: warm and dry Ext: normal to inspection, no dependent edema Neuro: CN ii-xii grossly intact, no focal deficits Psyche; normal affect,  calm and cooperative.   ED Course  Procedures (including critical care time) Labs Review  Results for orders placed during the hospital encounter of 07/01/13 (from the past 24 hour(s))  CBC     Status: Abnormal   Collection Time    07/01/13  4:41 AM      Result Value Ref Range   WBC 9.5  4.0 - 10.5 K/uL   RBC 5.42  4.22 - 5.81 MIL/uL   Hemoglobin 15.2  13.0 - 17.0 g/dL   HCT 45.1  39.0 - 52.0 %   MCV 83.2  78.0 - 100.0 fL   MCH 28.0  26.0 - 34.0 pg   MCHC 33.7  30.0 - 36.0 g/dL   RDW 14.4  11.5 - 15.5 %   Platelets 117 (*) 150 - 400 K/uL  COMPREHENSIVE METABOLIC PANEL     Status: Abnormal   Collection Time    07/01/13  4:41 AM      Result Value Ref Range   Sodium 143  137 - 147 mEq/L   Potassium 3.9  3.7 - 5.3 mEq/L   Chloride 104  96 - 112 mEq/L   CO2 25  19 - 32 mEq/L   Glucose, Bld 139 (*) 70 - 99 mg/dL   BUN 12  6 - 23 mg/dL   Creatinine, Ser  0.80  0.50 - 1.35 mg/dL   Calcium 9.2  8.4 - 10.5 mg/dL   Total Protein 6.1  6.0 - 8.3 g/dL   Albumin 3.8  3.5 - 5.2 g/dL   AST 1054 (*) 0 - 37 U/L   ALT 505 (*) 0 - 53 U/L   Alkaline Phosphatase 152 (*) 39 - 117 U/L  Total Bilirubin 2.6 (*) 0.3 - 1.2 mg/dL   GFR calc non Af Amer 84 (*) >90 mL/min   GFR calc Af Amer >90  >90 mL/min  LIPASE, BLOOD     Status: Abnormal   Collection Time    07/01/13  4:41 AM      Result Value Ref Range   Lipase 273 (*) 11 - 59 U/L  URINALYSIS, ROUTINE W REFLEX MICROSCOPIC     Status: Abnormal   Collection Time    07/01/13  4:50 AM      Result Value Ref Range   Color, Urine YELLOW  YELLOW   APPearance CLEAR  CLEAR   Specific Gravity, Urine 1.013  1.005 - 1.030   pH 5.5  5.0 - 8.0   Glucose, UA NEGATIVE  NEGATIVE mg/dL   Hgb urine dipstick NEGATIVE  NEGATIVE   Bilirubin Urine NEGATIVE  NEGATIVE   Ketones, ur NEGATIVE  NEGATIVE mg/dL   Protein, ur NEGATIVE  NEGATIVE mg/dL   Urobilinogen, UA 1.0  0.0 - 1.0 mg/dL   Nitrite NEGATIVE  NEGATIVE   Leukocytes, UA MODERATE (*) NEGATIVE  URINE MICROSCOPIC-ADD ON     Status: Abnormal   Collection Time    07/01/13  4:50 AM      Result Value Ref Range   Squamous Epithelial / LPF RARE  RARE   WBC, UA 11-20  <3 WBC/hpf   RBC / HPF 0-2  <3 RBC/hpf   Bacteria, UA MANY (*) RARE   Urine-Other MUCOUS PRESENT    I-STAT TROPOININ, ED     Status: None   Collection Time    07/01/13  4:54 AM      Result Value Ref Range   Troponin i, poc 0.00  0.00 - 0.08 ng/mL   Comment 3            CT ABDOMEN AND PELVIS WITH CONTRAST  TECHNIQUE: Multidetector CT imaging of the abdomen and pelvis was performed using the standard protocol following bolus administration of intravenous contrast.  CONTRAST: 76m OMNIPAQUE IOHEXOL 300 MG/ML SOLN, 10109mOMNIPAQUE IOHEXOL 300 MG/ML SOLN  COMPARISON: CT of the abdomen and pelvis performed 08/12/2010  FINDINGS: Minimal bibasilar atelectasis is noted. Scattered coronary  artery calcifications are seen.  The liver and spleen are unremarkable in appearance. The patient is status post cholecystectomy, with clips noted at the gallbladder fossa. The pancreas and adrenal glands are unremarkable.  A 0.7 cm stone is noted near the lower pole of the left kidney. Nonspecific perinephric stranding is noted bilaterally. Bilateral parapelvic renal cysts are seen. There is no evidence of hydronephrosis. No obstructing ureteral stones are identified.  No free fluid is identified. The small bowel is unremarkable in appearance. The stomach is within normal limits. No acute vascular abnormalities are seen. Relatively diffuse calcification is seen along the abdominal aorta and its branches.  The appendix is normal in caliber and contains air, without evidence for appendicitis.  Diffuse diverticulosis is noted along the sigmoid colon, and mild diverticulosis is seen along the descending colon, without evidence of diverticulitis at this time. The colon is otherwise unremarkable in appearance.  The bladder is mildly distended and grossly unremarkable. The prostate is enlarged, measuring 5.5 cm in transverse dimension, with scattered calcification. No inguinal lymphadenopathy is seen.  No acute osseous abnormalities are identified. The right hip arthroplasty is incompletely imaged, but appears grossly unremarkable. Multilevel vacuum phenomenon is noted along the lower thoracic and lumbar spine.  IMPRESSION: 1. No acute abnormality seen to  explain the patient's symptoms. 2. Diffuse diverticulosis along the sigmoid colon, and mild diverticulosis along the descending colon, without evidence of diverticulitis. 3. Relatively diffuse calcification along the abdominal aorta and its branches. 4. 0.7 cm left renal stone noted; no evidence of hydronephrosis. No obstructing ureteral stones seen. 5. Scattered coronary artery calcifications noted. 6. Enlarged prostate  seen. 7. Diffuse degenerative change noted along the lower thoracic and lumbar spine.   Electronically Signed By: Garald Balding M.D. On: 07/01/2013 07:07   MDM   Patient with UTI which we are treating with IV Ceftriaxone. In addition, noted to have marked elevation of transaminases and BR of 2.3. CT of the abd/pelvis unremarkable and nondiagnostic. I specifically discussed imaging of the patient's biliary ducts with Dr. Radene Knee who read the study. He reports that the biliary ducts are of normal caliber.   We will admit for monitoring and IV abx.     Elyn Peers, MD 07/01/13 [phone]

## 2013-07-01 NOTE — ED Notes (Signed)
Attempted to call report to floor, per secretary staff is in huddle at this time, and RN will call back when finished.

## 2013-07-01 NOTE — ED Notes (Signed)
US at bedside

## 2013-07-01 NOTE — H&P (Addendum)
[facility] History and Physical  [name] XTG:[id number] DOB: [birth year] DOA: 07/01/2013  Referring physician:Dr Cheri Guppy.  PCP: Arnette Norris, MD   Chief Complaint: Abdominal Pain.   HPI: [name] is a 80 y.o. male With PMH significant for CAD, BPH, Cholecystectomy, Chronic Thrombocytopenia, who present complaining of abdominal pain that started day prior to admission, he describe pain like band like, lower abdomen, sharp in quality, 9/10, intermittent. Maybe associated after meals. He vomits once in the ED. He denies alcohol use. He relates chills, and dysuria.    Review of Systems:  Negative, except as per HPI.   Past Medical History  Diagnosis Date  . Coronary atherosclerosis of unspecified type of vessel, native or graft   . Personal history of colonic polyps     adenomatous  . Hypertrophy of prostate without urinary obstruction and other lower urinary tract symptoms (LUTS)   . Unspecified essential hypertension   . Other and unspecified hyperlipidemia   . Diverticulosis of colon (without mention of hemorrhage)   . Degeneration of intervertebral disc, site unspecified   . Cluster headache   . Pernicious anemia   . Neurogenic dysphagia   . Osteoarthritis   . CHF (congestive heart failure)   . Unspecified sleep apnea     does not have cpap-was not able to tolerate  . Prostate enlargement     urinary frequency, nocturia  . Swallowing difficulty   . Thrombocytopenia, unspecified 09/01/2012   Past Surgical History  Procedure Laterality Date  . Inguinal herniorrhapy  2003  . Uvulectomy  1993  . Knee arthroscopy      5 times  . Total knee arthroplasty  2003  . Rotator cuff repair Right 2006  . Biopsy prostate  2008    Benign  . Tonsillectomy    . Total hip arthroplasty Right 2011  . Hernia repair  2004    RIH  . Bone marrow biopsy  2011  . Nasal endoscopy  2012  . Cholecystectomy  11/24/2010    Procedure: LAPAROSCOPIC CHOLECYSTECTOMY WITH  INTRAOPERATIVE CHOLANGIOGRAM;  Surgeon: Earnstine Regal, MD;  Location: [facility];  Service: General;  Laterality: N/A;  c-arm   Social History:  reports that he quit smoking about 47 years ago. His smoking use included Pipe. He has never used smokeless tobacco. He reports that he does not drink alcohol or use illicit drugs.  Allergies  Allergen Reactions  . Penicillins Other (See Comments)    [place]helps, passed out  . Sulfamethoxazole-Trimethoprim Rash  . Sulfonamide Derivatives Rash    Family History  Problem Relation Age of Onset  . Colon cancer Father 75  . Cancer Father     colon  . Peripheral vascular disease Mother      Prior to Admission medications   Medication Sig Start Date End Date Taking? Authorizing Provider  aspirin EC 81 MG tablet Take 81 mg by mouth daily.   Yes Historical Provider, MD  calcium carbonate (TUMS - DOSED IN MG ELEMENTAL CALCIUM) 500 MG chewable tablet Chew 2 tablets by mouth once.   Yes Historical Provider, MD  celecoxib (CELEBREX) 200 MG capsule Take 200 mg by mouth 2 (two) times daily as needed (for pain.).   Yes Historical Provider, MD  clobetasol cream (TEMOVATE) 2.70 % Apply 1 application topically 2 (two) times daily. For whelps/itching   Yes Historical Provider, MD  co-enzyme Q-10 30 MG capsule Take 200 mg by mouth daily.   Yes Historical Provider, MD  cyanocobalamin (,VITAMIN  B-12,) 1000 MCG/ML injection Inject 1 mL (1,000 mcg total) into the muscle every 30 (thirty) days. 06/02/12  Yes Lucille Passy, MD  doxazosin (CARDURA) 4 MG tablet Take 2 mg by mouth every evening.    Yes Historical Provider, MD  metoprolol succinate (TOPROL-XL) 25 MG 24 hr tablet Take 12.5 mg by mouth daily.   Yes Historical Provider, MD  nitroGLYCERIN (NITROSTAT) 0.4 MG SL tablet Place 1 tablet (0.4 mg total) under the tongue every 5 (five) minutes as needed. Up to 3 doses 01/09/13  Yes Burtis Junes, NP  Omega-3 Fatty Acids (FISH OIL) 1200 MG CAPS Take 1,200 mg by mouth daily.    Yes Historical Provider, MD  Tamsulosin HCl (FLOMAX) 0.4 MG CAPS Take 0.4 mg by mouth daily after supper.   Yes Historical Provider, MD   Physical Exam: Filed Vitals:   07/01/13 0523  BP: 137/61  Pulse: 95  Temp: 100.5 F (38.1 C)  Resp: 18    BP 137/61  Pulse 95  Temp(Src) 100.5 F (38.1 C) (Oral)  Resp 18  Ht 5' 1"  (1.549 m)  Wt 68.04 kg (150 lb)  BMI 28.36 kg/m2  SpO2 94%  General:  Appears calm and comfortable Eyes: PERRL, normal lids, irises & conjunctiva ENT: grossly normal hearing, lips & tongue Neck: no LAD, masses or thyromegaly Cardiovascular: RRR, no m/r/g. No LE edema. Telemetry: SR, no arrhythmias  Respiratory: CTA bilaterally, no w/r/r. Normal respiratory effort. Abdomen: soft, obese, distended, no RUQ tenderness, mild mid abdomen tenderness.  Skin: no rash or induration seen on limited exam Musculoskeletal: grossly normal tone BUE/BLE Psychiatric: grossly normal mood and affect, speech fluent and appropriate Neurologic: grossly non-focal.          Labs on Admission:  Basic Metabolic Panel:  Recent Labs Lab 07/01/13 0441  NA 143  K 3.9  CL 104  CO2 25  GLUCOSE 139*  BUN 12  CREATININE 0.80  CALCIUM 9.2   Liver Function Tests:  Recent Labs Lab 07/01/13 0441  AST 1054*  ALT 505*  ALKPHOS 152*  BILITOT 2.6*  PROT 6.1  ALBUMIN 3.8    Recent Labs Lab 07/01/13 0441  LIPASE 273*   No results found for this basename: AMMONIA,  in the last 168 hours CBC:  Recent Labs Lab 06/26/13 1007 07/01/13 0441  WBC 5.9 9.5  NEUTROABS 3.1  --   HGB 16.0 15.2  HCT 47.0 45.1  MCV 84.5 83.2  PLT 199 117*   Cardiac Enzymes: No results found for this basename: CKTOTAL, CKMB, CKMBINDEX, TROPONINI,  in the last 168 hours  BNP (last 3 results) No results found for this basename: PROBNP,  in the last 8760 hours CBG: No results found for this basename: GLUCAP,  in the last 168 hours  Radiological Exams on Admission: Ct Abdomen Pelvis W  Contrast  07/01/2013   CLINICAL DATA:  Abdominal discomfort. History of pernicious anemia and thrombocytopenia.  EXAM: CT ABDOMEN AND PELVIS WITH CONTRAST  TECHNIQUE: Multidetector CT imaging of the abdomen and pelvis was performed using the standard protocol following bolus administration of intravenous contrast.  CONTRAST:  6m OMNIPAQUE IOHEXOL 300 MG/ML SOLN, 1028mOMNIPAQUE IOHEXOL 300 MG/ML SOLN  COMPARISON:  CT of the abdomen and pelvis performed 08/12/2010  FINDINGS: Minimal bibasilar atelectasis is noted. Scattered coronary artery calcifications are seen.  The liver and spleen are unremarkable in appearance. The patient is status post cholecystectomy, with clips noted at the gallbladder fossa. The pancreas and adrenal glands are  unremarkable.  A 0.7 cm stone is noted near the lower pole of the left kidney. Nonspecific perinephric stranding is noted bilaterally. Bilateral parapelvic renal cysts are seen. There is no evidence of hydronephrosis. No obstructing ureteral stones are identified.  No free fluid is identified. The small bowel is unremarkable in appearance. The stomach is within normal limits. No acute vascular abnormalities are seen. Relatively diffuse calcification is seen along the abdominal aorta and its branches.  The appendix is normal in caliber and contains air, without evidence for appendicitis.  Diffuse diverticulosis is noted along the sigmoid colon, and mild diverticulosis is seen along the descending colon, without evidence of diverticulitis at this time. The colon is otherwise unremarkable in appearance.  The bladder is mildly distended and grossly unremarkable. The prostate is enlarged, measuring 5.5 cm in transverse dimension, with scattered calcification. No inguinal lymphadenopathy is seen.  No acute osseous abnormalities are identified. The right hip arthroplasty is incompletely imaged, but appears grossly unremarkable. Multilevel vacuum phenomenon is noted along the lower  thoracic and lumbar spine.  IMPRESSION: 1. No acute abnormality seen to explain the patient's symptoms. 2. Diffuse diverticulosis along the sigmoid colon, and mild diverticulosis along the descending colon, without evidence of diverticulitis. 3. Relatively diffuse calcification along the abdominal aorta and its branches. 4. 0.7 cm left renal stone noted; no evidence of hydronephrosis. No obstructing ureteral stones seen. 5. Scattered coronary artery calcifications noted. 6. Enlarged prostate seen. 7. Diffuse degenerative change noted along the lower thoracic and lumbar spine.   Electronically Signed   By: Garald Balding M.D.   On: 07/01/2013 07:07    EKG: none available.   Assessment/Plan Active Problems:   HYPERTENSION   Thrombocytopenia, unspecified   UTI (lower urinary tract infection)   Abnormal transaminases   Pancreatitis  1-Pancreatitis, transaminases: Tis could explain abdominal pain. He has prior history of cholecystectomy. CT abdomen with no acute abnormality that could  to explain the patient's symptoms. Will repeat labs in the morning. Will order Abdominal US. Might need MRCP. IV fluids, pain management, NPO. Check triglycerides.   2-UTI; UA with 11-20 WBC, he relates dysuria, chills. This might also explain abdominal pain. I will continue with Ceftriaxone. Follow up urine culture. Blood culture.   3-Thrombocytopenia; follow trend. Per Dr Beryle Beams notes "when When blood is obtained in a citrate anticoagulant, platelets are normal".    Code Status: DNR/DNI Family Communication: Care discussed with patient.  Disposition Plan: expect 3 to 4 days.   Time spent: 75 minutes.   Niel Hummer A [facility] Pager [phone]  **Disclaimer: This note may have been dictated with voice recognition software. Similar sounding words can inadvertently be transcribed and this note may contain transcription errors which may not have been corrected upon publication of note.**

## 2013-07-02 DIAGNOSIS — R300 Dysuria: Secondary | ICD-10-CM

## 2013-07-02 DIAGNOSIS — R7989 Other specified abnormal findings of blood chemistry: Secondary | ICD-10-CM | POA: Diagnosis present

## 2013-07-02 DIAGNOSIS — K5730 Diverticulosis of large intestine without perforation or abscess without bleeding: Secondary | ICD-10-CM

## 2013-07-02 DIAGNOSIS — K8051 Calculus of bile duct without cholangitis or cholecystitis with obstruction: Secondary | ICD-10-CM | POA: Diagnosis present

## 2013-07-02 DIAGNOSIS — R945 Abnormal results of liver function studies: Secondary | ICD-10-CM | POA: Diagnosis present

## 2013-07-02 LAB — COMPREHENSIVE METABOLIC PANEL
ALT: 347 U/L — ABNORMAL HIGH (ref 0–53)
AST: 218 U/L — ABNORMAL HIGH (ref 0–37)
Albumin: 2.9 g/dL — ABNORMAL LOW (ref 3.5–5.2)
Alkaline Phosphatase: 120 U/L — ABNORMAL HIGH (ref 39–117)
BUN: 7 mg/dL (ref 6–23)
CHLORIDE: 107 meq/L (ref 96–112)
CO2: 23 meq/L (ref 19–32)
CREATININE: 0.93 mg/dL (ref 0.50–1.35)
Calcium: 8.3 mg/dL — ABNORMAL LOW (ref 8.4–10.5)
GFR calc Af Amer: >90 mL/min (ref >90)
GFR, EST NON AFRICAN AMERICAN: 79 mL/min — AB (ref >90)
Glucose, Bld: 82 mg/dL (ref 70–99)
Potassium: 4 mEq/L (ref 3.7–5.3)
Sodium: 141 mEq/L (ref 137–147)
Total Bilirubin: 5.9 mg/dL — ABNORMAL HIGH (ref 0.3–1.2)
Total Protein: 4.9 g/dL — ABNORMAL LOW (ref 6.0–8.3)

## 2013-07-02 LAB — CBC
HCT: 40.3 % (ref 39.0–52.0)
Hemoglobin: 13.2 g/dL (ref 13.0–17.0)
MCH: 28 pg (ref 26.0–34.0)
MCHC: 32.8 g/dL (ref 30.0–36.0)
MCV: 85.6 fL (ref 78.0–100.0)
PLATELETS: 76 10*3/uL — AB (ref 150–400)
RBC: 4.71 MIL/uL (ref 4.22–5.81)
RDW: 14.7 % (ref 11.5–15.5)
WBC: 7.2 10*3/uL (ref 4.0–10.5)

## 2013-07-02 LAB — HEPATITIS PANEL, ACUTE
HCV AB: NEGATIVE
HEP B S AG: NEGATIVE
Hep A IgM: NONREACTIVE
Hep B C IgM: NONREACTIVE

## 2013-07-02 LAB — LIPASE, BLOOD: LIPASE: 17 U/L (ref 11–59)

## 2013-07-02 LAB — AMYLASE: Amylase: 53 U/L (ref 0–105)

## 2013-07-02 NOTE — Consult Note (Signed)
Referring Provider: No ref. provider found Primary Care Physician:  Arnette Norris, MD Primary Gastroenterologist:  Dr. Olevia Perches   Reason for Consultation:  Abdominal pain and elevated LFT's  HPI: [name] is a 80 y.o. male with PMH significant for CAD, BPH, cholecystectomy, pernicious anemia, and chronic thrombocytopenia.  He presented to [facility] ED on 6/14 complaining of abdominal pain that started the day prior to admission.  He says that he woke up Saturday at 4 AM with what he describes as a band-like pain in his mid-abdomen.  He says that the pain was coming and going throughout the day.  The pain returned on Saturday night, which is when he presented to the ED.  He vomited once in the ED, which was the only time that this occurred.   Has history of CBD stones and had ERCP with stone removal and sphincterotomy in 07/2010 by Dr. Carlean Purl.  LFT's were elevated as follows on admission:  AST 1054, ALT 505, ALP 152, and total bili 2.6.  Repeat LFT's today are as follows:  AST 218, ALT 347, ALP 120, and total bili 5.9.  Lipase on admission was 273, but is now normal at 17.  Viral hepatitis panel is pending.  No leukocytosis, and BMP is normal.  CT scan of the abdomen and pelvis with contrast showed the following:  IMPRESSION:  1. No acute abnormality seen to explain the patient's symptoms.  2. Diffuse diverticulosis along the sigmoid colon, and mild  diverticulosis along the descending colon, without evidence of  diverticulitis.  3. Relatively diffuse calcification along the abdominal aorta and  its branches.  4. 0.7 cm left renal stone noted; no evidence of hydronephrosis. No obstructing ureteral stones seen.  5. Scattered coronary artery calcifications noted.  6. Enlarged prostate seen.  7. Diffuse degenerative change noted along the lower thoracic and lumbar spine.  Ultrasound was then performed:  IMPRESSION:  Post cholecystectomy. No significant biliary dilatation.  Normal appearance  of the liver.  Left kidney stone without hydronephrosis. Left parapelvic renal  Cysts.  He has UTI and is receiving Rocephin for that.  He denies absolutely any abdominal pain since being admitted and has not required any pain medication.  Colonoscopy by Dr. Olevia Perches 12/2006 showed diverticulosis and one colon polyp that was adenomatous on pathology.   Past Medical History  Diagnosis Date  . Coronary atherosclerosis of unspecified type of vessel, native or graft   . Personal history of colonic polyps     adenomatous  . Hypertrophy of prostate without urinary obstruction and other lower urinary tract symptoms (LUTS)   . Unspecified essential hypertension   . Other and unspecified hyperlipidemia   . Diverticulosis of colon (without mention of hemorrhage)   . Degeneration of intervertebral disc, site unspecified   . Cluster headache   . Pernicious anemia   . Neurogenic dysphagia   . Osteoarthritis   . CHF (congestive heart failure)   . Unspecified sleep apnea     does not have cpap-was not able to tolerate  . Prostate enlargement     urinary frequency, nocturia  . Swallowing difficulty   . Thrombocytopenia, unspecified 09/01/2012    Past Surgical History  Procedure Laterality Date  . Inguinal herniorrhapy  2003  . Uvulectomy  1993  . Knee arthroscopy      5 times  . Total knee arthroplasty  2003  . Rotator cuff repair Right 2006  . Biopsy prostate  2008    Benign  . Tonsillectomy    .  Total hip arthroplasty Right 2011  . Hernia repair  2004    RIH  . Bone marrow biopsy  2011  . Nasal endoscopy  2012  . Cholecystectomy  11/24/2010    Procedure: LAPAROSCOPIC CHOLECYSTECTOMY WITH INTRAOPERATIVE CHOLANGIOGRAM;  Surgeon: Earnstine Regal, MD;  Location: [facility]S;  Service: General;  Laterality: N/A;  c-arm    Prior to Admission medications   Medication Sig Start Date End Date Taking? Authorizing Provider  aspirin EC 81 MG tablet Take 81 mg by mouth daily.   Yes Historical  Provider, MD  calcium carbonate (TUMS - DOSED IN MG ELEMENTAL CALCIUM) 500 MG chewable tablet Chew 2 tablets by mouth once.   Yes Historical Provider, MD  celecoxib (CELEBREX) 200 MG capsule Take 200 mg by mouth 2 (two) times daily as needed (for pain.).   Yes Historical Provider, MD  clobetasol cream (TEMOVATE) 2.22 % Apply 1 application topically 2 (two) times daily. For whelps/itching   Yes Historical Provider, MD  co-enzyme Q-10 30 MG capsule Take 200 mg by mouth daily.   Yes Historical Provider, MD  cyanocobalamin (,VITAMIN B-12,) 1000 MCG/ML injection Inject 1 mL (1,000 mcg total) into the muscle every 30 (thirty) days. 06/02/12  Yes Lucille Passy, MD  doxazosin (CARDURA) 4 MG tablet Take 2 mg by mouth every evening.    Yes Historical Provider, MD  metoprolol succinate (TOPROL-XL) 25 MG 24 hr tablet Take 12.5 mg by mouth daily.   Yes Historical Provider, MD  nitroGLYCERIN (NITROSTAT) 0.4 MG SL tablet Place 1 tablet (0.4 mg total) under the tongue every 5 (five) minutes as needed. Up to 3 doses 01/09/13  Yes Burtis Junes, NP  Omega-3 Fatty Acids (FISH OIL) 1200 MG CAPS Take 1,200 mg by mouth daily.   Yes Historical Provider, MD  Tamsulosin HCl (FLOMAX) 0.4 MG CAPS Take 0.4 mg by mouth daily after supper.   Yes Historical Provider, MD    Current Facility-Administered Medications  Medication Dose Route Frequency Provider Last Rate Last Dose  . 0.9 %  sodium chloride infusion   Intravenous Continuous Belkys A Regalado, MD 100 mL/hr at 07/02/13 0431    . acetaminophen (TYLENOL) tablet 650 mg  650 mg Oral Q6H PRN Belkys A Regalado, MD   650 mg at 07/02/13 1058   Or  . acetaminophen (TYLENOL) suppository 650 mg  650 mg Rectal Q6H PRN Belkys A Regalado, MD      . aspirin EC tablet 81 mg  81 mg Oral Daily Belkys A Regalado, MD   81 mg at 07/02/13 1055  . cefTRIAXone (ROCEPHIN) 1 g in dextrose 5 % 50 mL IVPB  1 g Intravenous Q24H Belkys A Regalado, MD   1 g at 07/02/13 0605  . clobetasol cream  (TEMOVATE) 9.79 % 1 application  1 application Topical BID Belkys A Regalado, MD   1 application at 89/21/19 1055  . doxazosin (CARDURA) tablet 2 mg  2 mg Oral QPM Belkys A Regalado, MD   2 mg at 07/01/13 1757  . HYDROcodone-acetaminophen (NORCO/VICODIN) 5-325 MG per tablet 1-2 tablet  1-2 tablet Oral Q4H PRN Belkys A Regalado, MD      . metoprolol succinate (TOPROL-XL) 24 hr tablet 12.5 mg  12.5 mg Oral Daily Belkys A Regalado, MD   12.5 mg at 07/02/13 1055  . morphine 2 MG/ML injection 0.5-1 mg  0.5-1 mg Intravenous Q4H PRN Belkys A Regalado, MD      . ondansetron (ZOFRAN) tablet 4 mg  4  mg Oral Q6H PRN Belkys A Regalado, MD       Or  . ondansetron (ZOFRAN) injection 4 mg  4 mg Intravenous Q6H PRN Belkys A Regalado, MD      . pantoprazole (PROTONIX) injection 40 mg  40 mg Intravenous Q12H Belkys A Regalado, MD   40 mg at 07/02/13 1055  . tamsulosin (FLOMAX) capsule 0.4 mg  0.4 mg Oral QPC supper Belkys A Regalado, MD   0.4 mg at 07/01/13 1757    Allergies as of 07/01/2013 - Review Complete 07/01/2013  Allergen Reaction Noted  . Penicillins Other (See Comments)   . Sulfamethoxazole-trimethoprim Rash 12/27/2008  . Sulfonamide derivatives Rash 12/22/2009    Family History  Problem Relation Age of Onset  . Colon cancer Father 67  . Cancer Father     colon  . Peripheral vascular disease Mother     History   Social History  . Marital Status: Married    Spouse Name: N/A    Number of Children: 2  . Years of Education: N/A   Occupational History  . retired    Social History Main Topics  . Smoking status: Former Smoker    Types: Pipe    Quit date: 01/18/1966  . Smokeless tobacco: Never Used  . Alcohol Use: No  . Drug Use: No  . Sexual Activity: Not on file   Other Topics Concern  . Not on file   Social History Narrative   Desires DNR.    Review of Systems: Ten point ROS is O/W negative except as mentioned in HPI.  Physical Exam: Vital signs in last 24 hours: Temp:   [97.7 F (36.5 C)-98.7 F (37.1 C)] 98.1 F (36.7 C) (06/15 1337) Pulse Rate:  [81-88] 86 (06/15 1337) Resp:  [18] 18 (06/15 1337) BP: (113-138)/(57-75) 126/75 mmHg (06/15 1337) SpO2:  [95 %-97 %] 97 % (06/15 1337) Last BM Date: 07/01/13 General:  Alert, Well-developed, well-nourished, pleasant and cooperative in NAD Head:  Normocephalic and atraumatic. Eyes:  Scleral icterus present. Ears:  Normal auditory acuity. Mouth:  No deformity or lesions. Lungs:  Clear throughout to auscultation.  No wheezes, crackles, or rhonchi.  Heart:  Regular rate and rhythm; no murmurs, clicks, rubs, or gallops. Abdomen:  Soft, non-distended.  BS present.  Non-tender.   Rectal:  Deferred  Msk:  Symmetrical without gross deformities. Pulses:  Normal pulses noted. Extremities:  Without clubbing or edema. Neurologic:  Alert and  oriented x4;  grossly normal neurologically. Skin:  Intact without significant lesions or rashes.  Jaundice present. Psych:  Alert and cooperative. Normal mood and affect.  Intake/Output from previous day: 06/14 0701 - 06/15 0700 In: 1956.7 [I.V.:1956.7] Out: 500 [Urine:500] Intake/Output this shift: Total I/O In: -  Out: 500 [Urine:500]  Lab Results:  Recent Labs  07/01/13 0441 07/02/13 0350  WBC 9.5 7.2  HGB 15.2 13.2  HCT 45.1 40.3  PLT 117* 76*   BMET  Recent Labs  07/01/13 0441 07/02/13 0350  NA 143 141  K 3.9 4.0  CL 104 107  CO2 25 23  GLUCOSE 139* 82  BUN 12 7  CREATININE 0.80 0.93  CALCIUM 9.2 8.3*   LFT  Recent Labs  07/02/13 0350  PROT 4.9*  ALBUMIN 2.9*  AST 218*  ALT 347*  ALKPHOS 120*  BILITOT 5.9*   Studies/Results: US Abdomen Complete  07/01/2013   CLINICAL DATA:  Abdominal pain.  EXAM: ULTRASOUND ABDOMEN COMPLETE  COMPARISON:  CT 07/01/2013  FINDINGS: Gallbladder:  Surgically removed.  Common bile duct:  Diameter: 0.9 cm  Liver:  No focal lesion identified. Within normal limits in parenchymal echogenicity.  IVC:  No  abnormality visualized.  Pancreas:  Visualized portion unremarkable.  Spleen:  Size and appearance within normal limits.  Right Kidney:  Length: 12.1 cm. Echogenicity within normal limits. No mass or hydronephrosis visualized.  Left Kidney:  Length: 12.4 cm. Echogenic structure with shadowing in the left kidney lower pole is consistent with a stone and corresponds with the previous CT findings. This stone roughly measures 0.7 cm. No evidence for hydronephrosis. Evidence for left parapelvic cysts and the largest measures up to 2.3 cm.  Abdominal aorta:  No aneurysm visualized.  Other findings:  None.  IMPRESSION: Post cholecystectomy.  No significant biliary dilatation.  Normal appearance of the liver.  Left kidney stone without hydronephrosis. Left parapelvic renal cysts.   Electronically Signed   By: Markus Daft M.D.   On: 07/01/2013 10:23   Ct Abdomen Pelvis W Contrast  07/01/2013   CLINICAL DATA:  Abdominal discomfort. History of pernicious anemia and thrombocytopenia.  EXAM: CT ABDOMEN AND PELVIS WITH CONTRAST  TECHNIQUE: Multidetector CT imaging of the abdomen and pelvis was performed using the standard protocol following bolus administration of intravenous contrast.  CONTRAST:  105m OMNIPAQUE IOHEXOL 300 MG/ML SOLN, 1068mOMNIPAQUE IOHEXOL 300 MG/ML SOLN  COMPARISON:  CT of the abdomen and pelvis performed 08/12/2010  FINDINGS: Minimal bibasilar atelectasis is noted. Scattered coronary artery calcifications are seen.  The liver and spleen are unremarkable in appearance. The patient is status post cholecystectomy, with clips noted at the gallbladder fossa. The pancreas and adrenal glands are unremarkable.  A 0.7 cm stone is noted near the lower pole of the left kidney. Nonspecific perinephric stranding is noted bilaterally. Bilateral parapelvic renal cysts are seen. There is no evidence of hydronephrosis. No obstructing ureteral stones are identified.  No free fluid is identified. The small bowel is  unremarkable in appearance. The stomach is within normal limits. No acute vascular abnormalities are seen. Relatively diffuse calcification is seen along the abdominal aorta and its branches.  The appendix is normal in caliber and contains air, without evidence for appendicitis.  Diffuse diverticulosis is noted along the sigmoid colon, and mild diverticulosis is seen along the descending colon, without evidence of diverticulitis at this time. The colon is otherwise unremarkable in appearance.  The bladder is mildly distended and grossly unremarkable. The prostate is enlarged, measuring 5.5 cm in transverse dimension, with scattered calcification. No inguinal lymphadenopathy is seen.  No acute osseous abnormalities are identified. The right hip arthroplasty is incompletely imaged, but appears grossly unremarkable. Multilevel vacuum phenomenon is noted along the lower thoracic and lumbar spine.  IMPRESSION: 1. No acute abnormality seen to explain the patient's symptoms. 2. Diffuse diverticulosis along the sigmoid colon, and mild diverticulosis along the descending colon, without evidence of diverticulitis. 3. Relatively diffuse calcification along the abdominal aorta and its branches. 4. 0.7 cm left renal stone noted; no evidence of hydronephrosis. No obstructing ureteral stones seen. 5. Scattered coronary artery calcifications noted. 6. Enlarged prostate seen. 7. Diffuse degenerative change noted along the lower thoracic and lumbar spine.   Electronically Signed   By: JeGarald Balding.D.   On: 07/01/2013 07:07    IMPRESSION:  -Abdominal pain and elevated LFT's in a patient with previous CBD stones requiring ERCP and sphincterotomy.  Abdominal pain now resolved and LFT's trending down (with exception of total bili which is up a  bit today).  ? If he passed some stones.  PLAN: -Monitor LFT's. -Plan per Dr. Carlean Purl.      ZEHR, JESSICA D.  07/02/2013, 1:49 PM  Pager number [phone]  [facility]  Attending  I have also seen and assessed the patient and agree with the above note. It sounds like he passed a CBD stone - will get MRCP to better understand things, see if he has choledocholithiasis now.  Gatha Mayer, MD, [facility] [phone] (pager) 07/02/2013 6:06 PM

## 2013-07-02 NOTE — Progress Notes (Signed)
Patient ID: [name], male   DOB: 10/08/1933, 80 y.o.   MRN: [id number] [facility] PROGRESS NOTE  [name] OHY:[id number] DOB: March 13, 1933 DOA: 07/01/2013 PCP: Arnette Norris, MD  Brief narrative: 80 y.o. male with past medical history of CAD, BPH, cholecystectomy, chronic thrombocytopenia who presented to [facility] ED 07/01/2013 with abdominal pian that started in RUQ and has radiated to left upper quadrant, 10 /10 in intensity with associated nausea and vomiting.  In ED, his vital are stable. LFT's were markedly abnormal with AST 1054, ALT 505 and AST 152, Bilirubin 2.6. Lipase was 273. CT abdomen showed diverticulosis but no diverticulitis, unremarkable pancreas and liver (+) cholecystectomy.  Assessment/Plan:  Principal Problem:   Abdominal pain, unspecified site  Unclear etiology; no gallstones or sludge seen on CT abd or abd [place]  Appreciate GI consult and recommendations   LFTS trending down except for bilirubin which is trending up from 2.6 to 5.9  Blood cultures negative   Continue IV fluids, analgesia PRN, antiemetics PRN Active Problems:   Obstructive jaundice, abnormal LFT's  May need other studies done, HIDA,  MRCP; will see with GI what they recommend    HYPERTENSION  Continue metoprolol    Thrombocytopenia, unspecified  Unclear etiology  Use SCD's for DVT prophylaxis   UTI (lower urinary tract infection)  Continue rocephin  Follow up urine culture results    DVT prophylaxis: SCD's + aspirin   Code Status: DNR/DNI Family Communication: plan of care discussed with the patient Disposition Plan: home when stable   Leisa Lenz, MD  [facility] Pager [phone]  If 7PM-7AM, please contact night-coverage [email or web address] Password [email or web address] 07/02/2013, 11:20 AM   LOS: 1 day   Consultants:  GI (Dr. Carlean Purl)   Procedures:  Abd [place]  CT abdomen   Antibiotics:  Rocephin 07/01/2013 -->  HPI/Subjective: No acute overnight  events.  Objective: Filed Vitals:   07/01/13 1015 07/01/13 1350 07/01/13 2126 07/02/13 0516  BP: 129/62 138/67 113/57 130/69  Pulse: 90 83 88 81  Temp: 98.6 F (37 C) 98.7 F (37.1 C) 98.6 F (37 C) 97.7 F (36.5 C)  TempSrc: Oral Oral Oral Oral  Resp: 16 18 18 18   Height: 5\' 1"  (1.549 m)     Weight: 69.3 kg (152 lb 12.5 oz)     SpO2: 94% 97% 95% 95%    Intake/Output Summary (Last 24 hours) at 07/02/13 1120 Last data filed at 07/02/13 1002  Gross per 24 hour  Intake 1956.66 ml  Output    800 ml  Net 1156.66 ml    Exam:   General:  Pt is alert, follows commands appropriately, not in acute distress  Cardiovascular: Regular rate and rhythm, S1/S2 appreciated   Respiratory: Clear to auscultation bilaterally, no wheezing, no crackles, no rhonchi  Abdomen: Soft, tender in mid abdomen, non distended, bowel sounds present  Extremities: No edema, pulses DP and PT palpable bilaterally  Neuro: Grossly nonfocal  Data Reviewed: Basic Metabolic Panel:  Recent Labs Lab 07/01/13 0441 07/02/13 0350  NA 143 141  K 3.9 4.0  CL 104 107  CO2 25 23  GLUCOSE 139* 82  BUN 12 7  CREATININE 0.80 0.93  CALCIUM 9.2 8.3*   Liver Function Tests:  Recent Labs Lab 07/01/13 0441 07/02/13 0350  AST 1054* 218*  ALT 505* 347*  ALKPHOS 152* 120*  BILITOT 2.6* 5.9*  PROT 6.1 4.9*  ALBUMIN 3.8 2.9*    Recent Labs Lab 07/01/13 0441 07/02/13 0350  LIPASE 273*  17  AMYLASE  --  53   No results found for this basename: AMMONIA,  in the last 168 hours CBC:  Recent Labs Lab 06/26/13 1007 07/01/13 0441 07/02/13 0350  WBC 5.9 9.5 7.2  NEUTROABS 3.1  --   --   HGB 16.0 15.2 13.2  HCT 47.0 45.1 40.3  MCV 84.5 83.2 85.6  PLT 199 117* 76*   Cardiac Enzymes: No results found for this basename: CKTOTAL, CKMB, CKMBINDEX, TROPONINI,  in the last 168 hours BNP: No components found with this basename: POCBNP,  CBG: No results found for this basename: GLUCAP,  in the last 168  hours  Recent Results (from the past 240 hour(s))  CULTURE, BLOOD (ROUTINE X 2)     Status: None   Collection Time    07/01/13  8:05 AM      Result Value Ref Range Status   Specimen Description BLOOD LEFT ARM   Final   Special Requests BOTTLES DRAWN AEROBIC AND ANAEROBIC [facility] EACH   Final   Culture  Setup Time     Final   Value: 07/01/2013 16:55     Performed at [facility]   Culture     Final   Value:        BLOOD CULTURE RECEIVED NO GROWTH TO DATE CULTURE WILL BE HELD FOR 5 DAYS BEFORE ISSUING A FINAL NEGATIVE REPORT     Performed at [facility]   Report Status PENDING   Incomplete  CULTURE, BLOOD (ROUTINE X 2)     Status: None   Collection Time    07/01/13  9:00 AM      Result Value Ref Range Status   Specimen Description BLOOD RIGHT FOREARM  3 ML IN [facility] BOTTLE   Final   Special Requests Normal   Final   Culture  Setup Time     Final   Value: 07/01/2013 16:55     Performed at [facility]   Culture     Final   Value:        BLOOD CULTURE RECEIVED NO GROWTH TO DATE CULTURE WILL BE HELD FOR 5 DAYS BEFORE ISSUING A FINAL NEGATIVE REPORT     Performed at [facility]   Report Status PENDING   Incomplete     Studies: US Abdomen Complete 07/01/2013    IMPRESSION: Post cholecystectomy.  No significant biliary dilatation.  Normal appearance of the liver.  Left kidney stone without hydronephrosis. Left parapelvic renal cysts.   Electronically Signed   By: Markus Daft M.D.   On: 07/01/2013 10:23   Ct Abdomen Pelvis W Contrast 07/01/2013    IMPRESSION: 1. No acute abnormality seen to explain the patient's symptoms. 2. Diffuse diverticulosis along the sigmoid colon, and mild diverticulosis along the descending colon, without evidence of diverticulitis. 3. Relatively diffuse calcification along the abdominal aorta and its branches. 4. 0.7 cm left renal stone noted; no evidence of hydronephrosis. No obstructing ureteral stones seen. 5. Scattered coronary artery  calcifications noted. 6. Enlarged prostate seen. 7. Diffuse degenerative change noted along the lower thoracic and lumbar spine.   Electronically Signed   By: Garald Balding M.D.   On: 07/01/2013 07:07    Scheduled Meds: . aspirin EC  81 mg Oral Daily  . cefTRIAXone  1 g Intravenous Q24H  . clobetasol cream  1 application Topical BID  . doxazosin  2 mg Oral QPM  . metoprolol succinate  12.5 mg Oral Daily  .  pantoprazole   40 mg Intravenous Q12H  . tamsulosin  0.4 mg Oral QPC supper   Continuous Infusions: . sodium chloride 100 mL/hr at 07/02/13 0431

## 2013-07-03 ENCOUNTER — Inpatient Hospital Stay (HOSPITAL_COMMUNITY): Payer: Medicare Other

## 2013-07-03 DIAGNOSIS — E785 Hyperlipidemia, unspecified: Secondary | ICD-10-CM

## 2013-07-03 DIAGNOSIS — R7989 Other specified abnormal findings of blood chemistry: Secondary | ICD-10-CM

## 2013-07-03 DIAGNOSIS — R109 Unspecified abdominal pain: Secondary | ICD-10-CM

## 2013-07-03 LAB — HEPATIC FUNCTION PANEL
ALK PHOS: 149 U/L — AB (ref 39–117)
ALT: 216 U/L — ABNORMAL HIGH (ref 0–53)
AST: 76 U/L — ABNORMAL HIGH (ref 0–37)
Albumin: 2.9 g/dL — ABNORMAL LOW (ref 3.5–5.2)
BILIRUBIN TOTAL: 4.6 mg/dL — AB (ref 0.3–1.2)
Bilirubin, Direct: 3.8 mg/dL — ABNORMAL HIGH (ref 0.0–0.3)
Indirect Bilirubin: 0.8 mg/dL (ref 0.3–0.9)
Total Protein: 5.1 g/dL — ABNORMAL LOW (ref 6.0–8.3)

## 2013-07-03 MED ORDER — GADOBENATE DIMEGLUMINE 529 MG/ML IV SOLN
15.0000 mL | Freq: Once | INTRAVENOUS | Status: AC | PRN
  Administered 2013-07-03: 15 mL via INTRAVENOUS

## 2013-07-03 MED ORDER — INDOMETHACIN 50 MG RE SUPP
100.0000 mg | Freq: Once | RECTAL | Status: DC
  Filled 2013-07-03: qty 2

## 2013-07-03 NOTE — Progress Notes (Signed)
Pt was lying in bed and awake; Pt's daughter [name]) and granddaughter were bedside. Pt and family talked about family; ministry; church. They also talked about the recent passing of pt's wife. Pt's daughter was teary-eyed, as was pt as they discussed the miracle of passing to go and be with the [place]. I listened and provided comfort. Had prayer w/pt and family just before he left for MRI.  Pt and family were grateful for visit and prayer.  Ernest Haber Chaplain  07/03/13 1200  Clinical Encounter Type  Visited With Patient and family together

## 2013-07-03 NOTE — Progress Notes (Signed)
     [facility] Progress Note  Subjective:  Feels well.  Still no pain.  Objective:  Vital signs in last 24 hours: Temp:  [97.8 F (36.6 C)-98.3 F (36.8 C)] 98.3 F (36.8 C) (06/16 0610) Pulse Rate:  [77-92] 92 (06/16 0610) Resp:  [18-20] 20 (06/16 0610) BP: (120-141)/(64-88) 141/88 mmHg (06/16 0610) SpO2:  [96 %-98 %] 98 % (06/16 0610) Last BM Date: 07/01/13 General:  Alert, Well-developed, in NAD Heart:  Regular rate and rhythm; no murmurs Pulm:  CTAB.  No W/R/R. Abdomen:  Soft, non-distended. Normal bowel sounds.  Non-tender. Extremities:  Without edema. Neurologic:  Alert and  oriented x4;  grossly normal neurologically. Psych:  Alert and cooperative. Normal mood and affect.  Intake/Output from previous day: 06/15 0701 - 06/16 0700 In: -  Out: 2575 [Urine:2575] Intake/Output this shift: Total I/O In: -  Out: 200 [Urine:200]  Lab Results:  Recent Labs  07/01/13 0441 07/02/13 0350  WBC 9.5 7.2  HGB 15.2 13.2  HCT 45.1 40.3  PLT 117* 76*   BMET  Recent Labs  07/01/13 0441 07/02/13 0350  NA 143 141  K 3.9 4.0  CL 104 107  CO2 25 23  GLUCOSE 139* 82  BUN 12 7  CREATININE 0.80 0.93  CALCIUM 9.2 8.3*   LFT  Recent Labs  07/02/13 0350  PROT 4.9*  ALBUMIN 2.9*  AST 218*  ALT 347*  ALKPHOS 120*  BILITOT 5.9*   Lab Results  Component Value Date   ALT 216* 07/03/2013   AST 76* 07/03/2013   ALKPHOS 149* 07/03/2013   BILITOT 4.6* 07/03/2013    Hepatitis Panel  Recent Labs  07/02/13 1435  HEPBSAG NEGATIVE  HCVAB NEGATIVE  HEPAIGM NON REACTIVE  HEPBIGM NON REACTIVE      Assessment / Plan: -Abdominal pain and elevated LFT's in a patient with previous CBD stones requiring ERCP and sphincterotomy. Abdominal pain now resolved. ? If he passed some stones.   *Await MRCP. *Will repeat LFT's.   LOS: 2 days   ZEHR, JESSICA D.  07/03/2013, 9:00 AM  Pager number [phone]  [facility] Attending  I have also seen and assessed  the patient and agree with the above note. Repeat LFT's better If MRCP negative then home today with outpatient f/u of LFT's If + then ERCP tomorrow (hopefully)  Gatha Mayer, MD, [facility] [phone] (pager) 07/03/2013 11:56 AM

## 2013-07-03 NOTE — Progress Notes (Signed)
Patient ID: [name], male   DOB: 07-Aug-1933, 80 y.o.   MRN: [id number] [facility] PROGRESS NOTE  [name] GEX:[id number] DOB: 24-Sep-1933 DOA: 07/01/2013 PCP: Arnette Norris, MD  Brief narrative: 80 y.o. male with past medical history of CAD, BPH, cholecystectomy, chronic thrombocytopenia who presented to [facility] ED 07/01/2013 with abdominal pian that started in RUQ and has radiated to left upper quadrant, 10 /10 in intensity with associated nausea and vomiting.  In ED, his vital are stable. LFT's were markedly abnormal with AST 1054, ALT 505 and AST 152, Bilirubin 2.6. Lipase was 273. CT abdomen showed diverticulosis but no diverticulitis, unremarkable pancreas and liver (+) cholecystectomy.   Assessment/Plan:   Principal Problem:  Abdominal pain, unspecified site  Unclear etiology; no gallstones or sludge seen on CT abd or abd [place]; needs MRCP today  LFTS trending down so possible he passed a stone Blood cultures negative  Continue IV fluids, analgesia PRN, antiemetics PRN Active Problems:  Obstructive jaundice, abnormal LFT's  Needs MRCP today HYPERTENSION  Continue metoprolol  Thrombocytopenia, unspecified  Unclear etiology  Use SCD's for DVT prophylaxis UTI (lower urinary tract infection)  Continue rocephin  Urine culture is growing E.Coli; sensitivity reports is pending   DVT prophylaxis: SCD's + aspirin   Code Status: DNR/DNI  Family Communication: plan of care discussed with the patient and his family  Disposition Plan: home when stable    Consultants:  GI (Dr. Carlean Purl)  Procedures:  Abd [place]  CT abdomen  MRCP Antibiotics:  Rocephin 07/01/2013 -->    Leisa Lenz, MD  [facility] Pager [phone]  If 7PM-7AM, please contact night-coverage [email or web address] Password [facility] 07/03/2013, 9:43 AM   LOS: 2 days    HPI/Subjective: No acute overnight events.  Objective: Filed Vitals:   07/02/13 0516 07/02/13 1337 07/02/13 2148 07/03/13 0610  BP:  130/69 126/75 120/64 141/88  Pulse: 81 86 77 92  Temp: 97.7 F (36.5 C) 98.1 F (36.7 C) 97.8 F (36.6 C) 98.3 F (36.8 C)  TempSrc: Oral Oral Oral Oral  Resp: 18 18 20 20   Height:      Weight:      SpO2: 95% 97% 96% 98%    Intake/Output Summary (Last 24 hours) at 07/03/13 0943 Last data filed at 07/03/13 0725  Gross per 24 hour  Intake      0 ml  Output   2775 ml  Net  -2775 ml    Exam:   General:  Pt is alert, follows commands appropriately, not in acute distress  Cardiovascular: Regular rate and rhythm, S1/S2, no murmurs  Respiratory: Clear to auscultation bilaterally, no wheezing, no crackles, no rhonchi  Abdomen: Soft, non tender, non distended, bowel sounds present  Extremities: No edema, pulses DP and PT palpable bilaterally  Neuro: Grossly nonfocal  Data Reviewed: Basic Metabolic Panel:  Recent Labs Lab 07/01/13 0441 07/02/13 0350  NA 143 141  K 3.9 4.0  CL 104 107  CO2 25 23  GLUCOSE 139* 82  BUN 12 7  CREATININE 0.80 0.93  CALCIUM 9.2 8.3*   Liver Function Tests:  Recent Labs Lab 07/01/13 0441 07/02/13 0350  AST 1054* 218*  ALT 505* 347*  ALKPHOS 152* 120*  BILITOT 2.6* 5.9*  PROT 6.1 4.9*  ALBUMIN 3.8 2.9*    Recent Labs Lab 07/01/13 0441 07/02/13 0350  LIPASE 273* 17  AMYLASE  --  53   No results found for this basename: AMMONIA,  in the last 168 hours CBC:  Recent Labs Lab 06/26/13 1007 07/01/13 0441 07/02/13 0350  WBC 5.9 9.5 7.2  NEUTROABS 3.1  --   --   HGB 16.0 15.2 13.2  HCT 47.0 45.1 40.3  MCV 84.5 83.2 85.6  PLT 199 117* 76*   Cardiac Enzymes: No results found for this basename: CKTOTAL, CKMB, CKMBINDEX, TROPONINI,  in the last 168 hours BNP: No components found with this basename: POCBNP,  CBG: No results found for this basename: GLUCAP,  in the last 168 hours  Recent Results (from the past 240 hour(s))  CULTURE, BLOOD (ROUTINE X 2)     Status: None   Collection Time    07/01/13  8:05 AM       Result Value Ref Range Status   Specimen Description BLOOD LEFT ARM   Final   Special Requests BOTTLES DRAWN AEROBIC AND ANAEROBIC [facility] EACH   Final   Culture  Setup Time     Final   Value: 07/01/2013 16:55     Performed at [facility]   Culture     Final   Value:        BLOOD CULTURE RECEIVED NO GROWTH TO DATE CULTURE WILL BE HELD FOR 5 DAYS BEFORE ISSUING A FINAL NEGATIVE REPORT     Performed at [facility]   Report Status PENDING   Incomplete  CULTURE, BLOOD (ROUTINE X 2)     Status: None   Collection Time    07/01/13  9:00 AM      Result Value Ref Range Status   Specimen Description BLOOD RIGHT FOREARM  3 ML IN [facility] BOTTLE   Final   Special Requests Normal   Final   Culture  Setup Time     Final   Value: 07/01/2013 16:55     Performed at [facility]   Culture     Final   Value:        BLOOD CULTURE RECEIVED NO GROWTH TO DATE CULTURE WILL BE HELD FOR 5 DAYS BEFORE ISSUING A FINAL NEGATIVE REPORT     Performed at [facility]   Report Status PENDING   Incomplete  URINE CULTURE     Status: None   Collection Time    07/01/13  3:08 PM      Result Value Ref Range Status   Specimen Description URINE, RANDOM   Final   Special Requests NONE   Final   Culture  Setup Time     Final   Value: 07/02/2013 01:00     Performed at [place]     Final   Value: 80,000 COLONIES/ML     Performed at [facility]   Culture     Final   Value: ESCHERICHIA COLI     Performed at [facility]   Report Status PENDING   Incomplete     Studies: US Abdomen Complete  07/01/2013   CLINICAL DATA:  Abdominal pain.  EXAM: ULTRASOUND ABDOMEN COMPLETE  COMPARISON:  CT 07/01/2013  FINDINGS: Gallbladder:  Surgically removed.  Common bile duct:  Diameter: 0.9 cm  Liver:  No focal lesion identified. Within normal limits in parenchymal echogenicity.  IVC:  No abnormality visualized.  Pancreas:  Visualized portion unremarkable.  Spleen:   Size and appearance within normal limits.  Right Kidney:  Length: 12.1 cm. Echogenicity within normal limits. No mass or hydronephrosis visualized.  Left Kidney:  Length: 12.4 cm. Echogenic structure with shadowing in the left  kidney lower pole is consistent with a stone and corresponds with the previous CT findings. This stone roughly measures 0.7 cm. No evidence for hydronephrosis. Evidence for left parapelvic cysts and the largest measures up to 2.3 cm.  Abdominal aorta:  No aneurysm visualized.  Other findings:  None.  IMPRESSION: Post cholecystectomy.  No significant biliary dilatation.  Normal appearance of the liver.  Left kidney stone without hydronephrosis. Left parapelvic renal cysts.   Electronically Signed   By: Markus Daft M.D.   On: 07/01/2013 10:23    Scheduled Meds: . aspirin EC  81 mg Oral Daily  . cefTRIAXone (ROCEPHIN)  IV  1 g Intravenous Q24H  . clobetasol cream  1 application Topical BID  . doxazosin  2 mg Oral QPM  . metoprolol succinate  12.5 mg Oral Daily  . pantoprazole (PROTONIX) IV  40 mg Intravenous Q12H  . tamsulosin  0.4 mg Oral QPC supper   Continuous Infusions: . sodium chloride 100 mL/hr at 07/03/13 0056

## 2013-07-03 NOTE — Progress Notes (Signed)
   MRCP suggests possible CBD stone so ERCP appropriate Will try to do tomorrow  Patient aware  Gatha Mayer, MD, [facility] [phone] (pager) 07/03/2013 6:21 PM

## 2013-07-04 ENCOUNTER — Inpatient Hospital Stay (HOSPITAL_COMMUNITY): Payer: Medicare Other

## 2013-07-04 ENCOUNTER — Encounter (HOSPITAL_COMMUNITY): Payer: Self-pay

## 2013-07-04 ENCOUNTER — Encounter (HOSPITAL_COMMUNITY)
Admission: EM | Disposition: A | Discharge status: Home / Self Care | Payer: Self-pay | Source: Home / Self Care | Attending: Internal Medicine

## 2013-07-04 DIAGNOSIS — K8051 Calculus of bile duct without cholangitis or cholecystitis with obstruction: Principal | ICD-10-CM

## 2013-07-04 HISTORY — PX: ERCP: SHX5425

## 2013-07-04 LAB — URINE CULTURE: Colony Count: 80000

## 2013-07-04 SURGERY — ERCP, WITH INTERVENTION IF INDICATED
Anesthesia: Moderate Sedation

## 2013-07-04 MED ORDER — BUTAMBEN-TETRACAINE-BENZOCAINE 2-2-14 % EX AERO
INHALATION_SPRAY | CUTANEOUS | Status: DC | PRN
  Administered 2013-07-04: 2 via TOPICAL

## 2013-07-04 MED ORDER — CIPROFLOXACIN IN D5W 400 MG/200ML IV SOLN
INTRAVENOUS | Status: AC
  Filled 2013-07-04: qty 200

## 2013-07-04 MED ORDER — SODIUM CHLORIDE 0.9 % IV SOLN
INTRAVENOUS | Status: DC | PRN
  Administered 2013-07-04: 16:00:00

## 2013-07-04 MED ORDER — SODIUM CHLORIDE 0.9 % IV SOLN
INTRAVENOUS | Status: DC
  Administered 2013-07-04: 14:00:00 via INTRAVENOUS

## 2013-07-04 MED ORDER — MIDAZOLAM HCL 10 MG/2ML IJ SOLN
INTRAMUSCULAR | Status: DC | PRN
  Administered 2013-07-04: 1 mg via INTRAVENOUS
  Administered 2013-07-04: 2 mg via INTRAVENOUS
  Administered 2013-07-04: 1 mg via INTRAVENOUS
  Administered 2013-07-04: 2 mg via INTRAVENOUS
  Administered 2013-07-04: 1 mg via INTRAVENOUS

## 2013-07-04 MED ORDER — FENTANYL CITRATE 0.05 MG/ML IJ SOLN
INTRAMUSCULAR | Status: DC | PRN
  Administered 2013-07-04 (×2): 12.5 ug via INTRAVENOUS
  Administered 2013-07-04 (×2): 25 ug via INTRAVENOUS
  Administered 2013-07-04: 12.5 ug via INTRAVENOUS

## 2013-07-04 MED ORDER — GLUCAGON HCL RDNA (DIAGNOSTIC) 1 MG IJ SOLR
INTRAMUSCULAR | Status: AC
  Filled 2013-07-04: qty 1

## 2013-07-04 MED ORDER — FENTANYL CITRATE 0.05 MG/ML IJ SOLN
INTRAMUSCULAR | Status: AC
  Filled 2013-07-04: qty 4

## 2013-07-04 MED ORDER — DIPHENHYDRAMINE HCL 50 MG/ML IJ SOLN
INTRAMUSCULAR | Status: AC
Start: 2013-07-04 — End: 2013-07-04
  Filled 2013-07-04: qty 1

## 2013-07-04 MED ORDER — INDOMETHACIN 50 MG RE SUPP
100.0000 mg | Freq: Once | RECTAL | Status: AC
  Administered 2013-07-04: 100 mg via RECTAL
  Filled 2013-07-04: qty 2

## 2013-07-04 MED ORDER — CIPROFLOXACIN IN D5W 400 MG/200ML IV SOLN
400.0000 mg | Freq: Once | INTRAVENOUS | Status: AC
  Administered 2013-07-04: 400 mg via INTRAVENOUS

## 2013-07-04 MED ORDER — DIPHENHYDRAMINE HCL 50 MG/ML IJ SOLN
INTRAMUSCULAR | Status: DC | PRN
  Administered 2013-07-04: 25 mg via INTRAVENOUS

## 2013-07-04 MED ORDER — MIDAZOLAM HCL 10 MG/2ML IJ SOLN
INTRAMUSCULAR | Status: AC
  Filled 2013-07-04: qty 4

## 2013-07-04 NOTE — Op Note (Signed)
[facility] [place], [id number]   ERCP PROCEDURE REPORT  PATIENT: [name], [name].  MR# :[id number] BIRTHDATE: 1933/11/07  GENDER: Male ENDOSCOPIST: Gatha Mayer, MD, [facility] PROCEDURE DATE:  07/04/2013 PROCEDURE:   ERCP with sphincterotomy/papillotomy and ERCP with removal of calculus/calculi ASA CLASS:   Class III INDICATIONS:suspected or rule out bile duct stones. jaundice, abd pain and suspected stone(s) on MRCP MEDICATIONS: Fentanyl 87.5 mcg IV, Versed 7 mg IV, Benadryl 25 mg IV, and Cipro 400 mg IV TOPICAL ANESTHETIC: Cetacaine Spray  DESCRIPTION OF PROCEDURE:   After the risks benefits and alternatives of the procedure were thoroughly explained, informed consent was obtained.  The     endoscope was introduced through the mouth  and advanced to the second portion of the duodenum .  1) Normal stomach, normal duodenum 2) Prior sphincterotomy at [place], covered by mucosal fold 3) Easy cannulation of bile duct with initial pancreatogram (partial - NL) 4) Cholangiogram showed 2 stones in 10 mm CBD, mobile.  Otherwise NL s/p cholecystectomy 5) Biliary sphincterotomy sphincterotomy extension performed and 2 stones removed with balloon sweeps. 6) Occlusion cholangiogram negative at end The scope was then completely withdrawn from the patient and the procedure terminated.     COMPLICATIONS: .  There were no complications.  ENDOSCOPIC IMPRESSION: Common bile duct stone(s) - 2 removed  RECOMMENDATIONS: Routine post-ERCP care If w/o problems home tomorrow and will see him in my office for f/u      eSigned:  Gatha Mayer, MD, [facility] 07/04/2013 3:28 PM

## 2013-07-04 NOTE — Progress Notes (Addendum)
PROGRESS NOTE  [name] IRW:[id number] DOB: 1933-07-30 DOA: 07/01/2013 PCP: Arnette Norris, MD  HPI: 80 y.o. male with past medical history of CAD, BPH, cholecystectomy, chronic thrombocytopenia who presented to [facility] ED 07/01/2013 with abdominal pian that started in RUQ and has radiated to left upper quadrant, 10 /10 in intensity with associated nausea and vomiting. In ED, his vital are stable. LFT's were markedly abnormal with AST 1054, ALT 505 and AST 152, Bilirubin 2.6. Lipase was 273. CT abdomen showed diverticulosis but no diverticulitis, unremarkable pancreas and liver (+) cholecystectomy.   Assessment/Plan:  Abdominal pain - MRCP with CBD stone, GI consulted, s/p ERCP 6/17 with removal of 2 stones  Obstructive jaundice, abnormal LFT's - s/p ERCP 6/17  HYPERTENSION - Continue metoprolol   Thrombocytopenia, unspecified Unclear etiology, chronic.   UTI (lower urinary tract infection) Continue rocephin, will d/c on keflex as sensitive.  Diet: clear Fluids: none  DVT Prophylaxis: SCDs  Code Status: DNR Family Communication: d/w patient  Disposition Plan: home when ready   Consultants:  GI  Procedures:  ERCP 6/17 1) Normal stomach, normal duodenum  2) Prior sphincterotomy at [place], covered by mucosal fold  3) Easy cannulation of bile duct with initial pancreatogram (partial  - NL)  4) Cholangiogram showed 2 stones in 10 mm CBD, mobile. Otherwise NL  s/p cholecystectomy  5) Biliary sphincterotomy sphincterotomy extension performed and 2  stones removed with balloon sweeps.  6) Occlusion cholangiogram negative at end    Antibiotics  Anti-infectives   Start     Dose/Rate Route Frequency Ordered Stop   07/04/13 1345  ciprofloxacin (CIPRO) IVPB 400 mg     400 mg 200 mL/hr over 60 Minutes Intravenous  Once 07/04/13 1333 07/04/13 1447   07/02/13 0600  cefTRIAXone (ROCEPHIN) 1 g in dextrose 5 % 50 mL IVPB     1 g 100 mL/hr over 30 Minutes Intravenous Every 24  hours 07/01/13 1501     07/01/13 1515  cefTRIAXone (ROCEPHIN) 1 g in dextrose 5 % 50 mL IVPB  Status:  Discontinued     1 g 100 mL/hr over 30 Minutes Intravenous Every 24 hours 07/01/13 1501 07/01/13 1501   07/01/13 0600  cefTRIAXone (ROCEPHIN) 1 g in dextrose 5 % 50 mL IVPB     1 g 100 mL/hr over 30 Minutes Intravenous  Once 07/01/13 0559 07/01/13 0701     Antibiotics Given (last 72 hours)   Date/Time Action Medication Dose Rate   07/02/13 0605 Given   cefTRIAXone (ROCEPHIN) 1 g in dextrose 5 % 50 mL IVPB 1 g 100 mL/hr   07/03/13 0630 Given   cefTRIAXone (ROCEPHIN) 1 g in dextrose 5 % 50 mL IVPB 1 g 100 mL/hr   07/04/13 0654 Given   cefTRIAXone (ROCEPHIN) 1 g in dextrose 5 % 50 mL IVPB 1 g 100 mL/hr   07/04/13 1347 Given   ciprofloxacin (CIPRO) IVPB 400 mg 400 mg 200 mL/hr      HPI/Subjective: Feeling well, pain better, awaiting ERCP  Objective: Filed Vitals:   07/04/13 1535 07/04/13 1545 07/04/13 1555 07/04/13 1706  BP: 115/58 119/45 132/58 170/84  Pulse: 67 62 66 62  Temp:    97.8 F (36.6 C)  TempSrc:    Oral  Resp: 16 16 15 16   Height:      Weight:      SpO2: 98% 98% 98% 97%    Intake/Output Summary (Last 24 hours) at 07/04/13 1715 Last data filed at 07/04/13 1012  Gross per 24 hour  Intake    100 ml  Output   1400 ml  Net  -1300 ml   Filed Weights   07/01/13 0228 07/01/13 1015  Weight: 68.04 kg (150 lb) 69.3 kg (152 lb 12.5 oz)    Exam:   General:  NAD  Cardiovascular: regular rate and rhythm, without MRG  Respiratory: good air movement, clear to auscultation throughout, no wheezing, ronchi or rales  Abdomen: soft, not tender to palpation, positive bowel sounds  MSK: no peripheral edema  Neuro: CN 2-12 grossly intact, MS 5/5 in all 4  Data Reviewed: Basic Metabolic Panel:  Recent Labs Lab 07/01/13 0441 07/02/13 0350  NA 143 141  K 3.9 4.0  CL 104 107  CO2 25 23  GLUCOSE 139* 82  BUN 12 7  CREATININE 0.80 0.93  CALCIUM 9.2 8.3*    Liver Function Tests:  Recent Labs Lab 07/01/13 0441 07/02/13 0350 07/03/13 1000  AST 1054* 218* 76*  ALT 505* 347* 216*  ALKPHOS 152* 120* 149*  BILITOT 2.6* 5.9* 4.6*  PROT 6.1 4.9* 5.1*  ALBUMIN 3.8 2.9* 2.9*    Recent Labs Lab 07/01/13 0441 07/02/13 0350  LIPASE 273* 17  AMYLASE  --  53   CBC:  Recent Labs Lab 07/01/13 0441 07/02/13 0350  WBC 9.5 7.2  HGB 15.2 13.2  HCT 45.1 40.3  MCV 83.2 85.6  PLT 117* 76*    Recent Results (from the past 240 hour(s))  CULTURE, BLOOD (ROUTINE X 2)     Status: None   Collection Time    07/01/13  8:05 AM      Result Value Ref Range Status   Specimen Description BLOOD LEFT ARM   Final   Special Requests BOTTLES DRAWN AEROBIC AND ANAEROBIC [facility] EACH   Final   Culture  Setup Time     Final   Value: 07/01/2013 16:55     Performed at [place]     Final   Value:        BLOOD CULTURE RECEIVED NO GROWTH TO DATE CULTURE WILL BE HELD FOR 5 DAYS BEFORE ISSUING A FINAL NEGATIVE REPORT     Performed at [facility]   Report Status PENDING   Incomplete  CULTURE, BLOOD (ROUTINE X 2)     Status: None   Collection Time    07/01/13  9:00 AM      Result Value Ref Range Status   Specimen Description BLOOD RIGHT FOREARM  3 ML IN [facility] BOTTLE   Final   Special Requests Normal   Final   Culture  Setup Time     Final   Value: 07/01/2013 16:55     Performed at [facility]   Culture     Final   Value:        BLOOD CULTURE RECEIVED NO GROWTH TO DATE CULTURE WILL BE HELD FOR 5 DAYS BEFORE ISSUING A FINAL NEGATIVE REPORT     Performed at [facility]   Report Status PENDING   Incomplete  URINE CULTURE     Status: None   Collection Time    07/01/13  3:08 PM      Result Value Ref Range Status   Specimen Description URINE, RANDOM   Final   Special Requests NONE   Final   Culture  Setup Time     Final   Value: 07/02/2013 01:00     Performed at [facility]  Colony Count     Final    Value: 80,000 COLONIES/ML     Performed at [facility]   Culture     Final   Value: ESCHERICHIA COLI     Performed at [facility]   Report Status 07/04/2013 FINAL   Final   Organism ID, Bacteria ESCHERICHIA COLI   Final    Studies: Dg Eye Foreign Body  07/03/2013   CLINICAL DATA:  Metal working/exposure; clearance prior to MRI  EXAM: ORBITS FOR FOREIGN BODY - 2 VIEW  COMPARISON:  None.  FINDINGS: There is no evidence of metallic foreign body within the orbits. No significant bone abnormality identified.  IMPRESSION: No evidence of metallic foreign body within the orbits.   Electronically Signed   By: Vinnie Langton M.D.   On: 07/03/2013 13:19   Mr 3d Recon At Scanner  07/03/2013   CLINICAL DATA:  Abnormal liver tests, question common bile duct stone. Prior cholecystectomy.  EXAM: MRI ABDOMEN WITHOUT  (INCLUDING MRCP)  TECHNIQUE: Multiplanar multisequence MR imaging of the abdomen was performed. Heavily T2-weighted images of the biliary and pancreatic ducts were obtained, and three-dimensional MRCP images were rendered by post processing.  COMPARISON:  CT 07/01/2013, ultrasound 07/01/2013  FINDINGS: Mild atelectasis at the lung bases.  No focal hepatic lesion. No intrahepatic biliary duct dilatation. Patient status post cholecystectomy. The common bile duct is normal caliber measuring 5 mm through the pancreatic head. The most distal aspect of the common bile duct there is loss of signal intensity of the heavily T2 weighted MRCP sequences (series 4, image 56 and series 400, image 40). Defect also seen on image 31 of series 5.  The pancreatic duct is normal caliber. The pancreatic parenchyma is normal.  The spleen, adrenal glands, kidneys are normal. Bilateral parapelvic cysts.  The stomach and limited view of the small bowel and colon are unremarkable. There is a small hiatal hernia.  IMPRESSION: 1. Loss of signal intensity in the distal common bile duct could represent sludge,  calculus, stricture, or artifact. Consider consider ERCP if patient bilirubin is elevated. 2. Post cholecystectomy without complication. 3. The pancreas is normal.   Electronically Signed   By: Suzy Bouchard M.D.   On: 07/03/2013 15:26   Dg Ercp  07/04/2013   CLINICAL DATA:  possible CBD stone  EXAM: ERCP  TECHNIQUE: Multiple spot images obtained with the fluoroscopic device and submitted for interpretation post-procedure.  COMPARISON:  None.  FINDINGS: A series of 6 fluoroscopic spot images submitted, demonstrating endoscopic cannulation of CBD with opacification and placement of a balloon tipped catheter. Filling defect is noted in the distal CBD on the fourth image. There is evidence of balloon sweep through the CBD by final image with no persistent filling defect evident.  IMPRESSION: 1. Distal CBD filling defect suggesting possible retained calculus.  These images were submitted for radiologic interpretation only. Please see the procedural report for the amount of contrast and the fluoroscopy time utilized.   Electronically Signed   By: Arne Cleveland M.D.   On: 07/04/2013 16:14   Mr Lambert Mody Cm/mrcp  07/03/2013   CLINICAL DATA:  Abnormal liver tests, question common bile duct stone. Prior cholecystectomy.  EXAM: MRI ABDOMEN WITHOUT  (INCLUDING MRCP)  TECHNIQUE: Multiplanar multisequence MR imaging of the abdomen was performed. Heavily T2-weighted images of the biliary and pancreatic ducts were obtained, and three-dimensional MRCP images were rendered by post processing.  COMPARISON:  CT 07/01/2013, ultrasound 07/01/2013  FINDINGS: Mild atelectasis  at the lung bases.  No focal hepatic lesion. No intrahepatic biliary duct dilatation. Patient status post cholecystectomy. The common bile duct is normal caliber measuring 5 mm through the pancreatic head. The most distal aspect of the common bile duct there is loss of signal intensity of the heavily T2 weighted MRCP sequences (series 4, image 56 and series  400, image 40). Defect also seen on image 31 of series 5.  The pancreatic duct is normal caliber. The pancreatic parenchyma is normal.  The spleen, adrenal glands, kidneys are normal. Bilateral parapelvic cysts.  The stomach and limited view of the small bowel and colon are unremarkable. There is a small hiatal hernia.  IMPRESSION: 1. Loss of signal intensity in the distal common bile duct could represent sludge, calculus, stricture, or artifact. Consider consider ERCP if patient bilirubin is elevated. 2. Post cholecystectomy without complication. 3. The pancreas is normal.   Electronically Signed   By: Suzy Bouchard M.D.   On: 07/03/2013 15:26    Scheduled Meds: . aspirin EC  81 mg Oral Daily  . cefTRIAXone (ROCEPHIN)  IV  1 g Intravenous Q24H  . clobetasol cream  1 application Topical BID  . doxazosin  2 mg Oral QPM  . metoprolol succinate  12.5 mg Oral Daily  . pantoprazole (PROTONIX) IV  40 mg Intravenous Q12H  . tamsulosin  0.4 mg Oral QPC supper   Continuous Infusions: . sodium chloride 100 mL/hr at 07/04/13 0040   Principal Problem:   Choledocholithiasis with obstruction Active Problems:   HYPERTENSION   Thrombocytopenia, unspecified   UTI (lower urinary tract infection)   Abnormal LFTs  Time spent: 25  This note has been created with Surveyor, quantity. Any transcriptional errors are unintentional.   Marzetta Board, MD [facility] Pager [phone]. If 7 PM - 7 AM, please contact night-coverage at [email or web address], password [facility] 07/04/2013, 5:15 PM  LOS: 3 days

## 2013-07-04 NOTE — Progress Notes (Signed)
Pt with prolonged waking time...has pronounced sleep apnea,.Marland KitchenMarland Kitchen

## 2013-07-05 ENCOUNTER — Encounter (HOSPITAL_COMMUNITY): Payer: Self-pay | Admitting: Internal Medicine

## 2013-07-05 ENCOUNTER — Other Ambulatory Visit: Payer: Self-pay | Admitting: *Deleted

## 2013-07-05 ENCOUNTER — Telehealth: Payer: Self-pay | Admitting: Family Medicine

## 2013-07-05 DIAGNOSIS — R7989 Other specified abnormal findings of blood chemistry: Secondary | ICD-10-CM

## 2013-07-05 DIAGNOSIS — R945 Abnormal results of liver function studies: Secondary | ICD-10-CM

## 2013-07-05 LAB — CBC
HEMATOCRIT: 39.3 % (ref 39.0–52.0)
Hemoglobin: 13 g/dL (ref 13.0–17.0)
MCH: 28 pg (ref 26.0–34.0)
MCHC: 33.1 g/dL (ref 30.0–36.0)
MCV: 84.5 fL (ref 78.0–100.0)
Platelets: 47 10*3/uL — ABNORMAL LOW (ref 150–400)
RBC: 4.65 MIL/uL (ref 4.22–5.81)
RDW: 15 % (ref 11.5–15.5)
WBC: 6.9 10*3/uL (ref 4.0–10.5)

## 2013-07-05 LAB — COMPREHENSIVE METABOLIC PANEL
ALT: 150 U/L — ABNORMAL HIGH (ref 0–53)
AST: 69 U/L — ABNORMAL HIGH (ref 0–37)
Albumin: 2.4 g/dL — ABNORMAL LOW (ref 3.5–5.2)
Alkaline Phosphatase: 188 U/L — ABNORMAL HIGH (ref 39–117)
BILIRUBIN TOTAL: 4.7 mg/dL — AB (ref 0.3–1.2)
BUN: 3 mg/dL — AB (ref 6–23)
CHLORIDE: 109 meq/L (ref 96–112)
CO2: 24 meq/L (ref 19–32)
CREATININE: 0.71 mg/dL (ref 0.50–1.35)
Calcium: 8 mg/dL — ABNORMAL LOW (ref 8.4–10.5)
GFR calc Af Amer: >90 mL/min (ref >90)
GFR, EST NON AFRICAN AMERICAN: 88 mL/min — AB (ref >90)
Glucose, Bld: 117 mg/dL — ABNORMAL HIGH (ref 70–99)
Potassium: 3.7 mEq/L (ref 3.7–5.3)
Sodium: 144 mEq/L (ref 137–147)
Total Protein: 4.6 g/dL — ABNORMAL LOW (ref 6.0–8.3)

## 2013-07-05 LAB — LIPASE, BLOOD: LIPASE: 34 U/L (ref 11–59)

## 2013-07-05 LAB — AMYLASE: AMYLASE: 55 U/L (ref 0–105)

## 2013-07-05 MED ORDER — PANTOPRAZOLE SODIUM 40 MG PO TBEC
40.0000 mg | DELAYED_RELEASE_TABLET | Freq: Two times a day (BID) | ORAL | Status: DC
  Administered 2013-07-05: 40 mg via ORAL
  Filled 2013-07-05: qty 1

## 2013-07-05 MED ORDER — CEFPODOXIME PROXETIL 100 MG PO TABS: 100.0000 mg | ORAL_TABLET | Freq: Two times a day (BID) | ORAL | Status: DC

## 2013-07-05 NOTE — Progress Notes (Signed)
Pt with severe sleep apnea. To stay awaken enough to respond to more than yes/no questions.  Attempted to call report x2, but receiving nurse is unable to assume care at this time due to new admitt. Will call back again in 10 minutes to give report and move patient to floor.  Daughter is emotionally distressed due to daughth of mother her 3 weeks prior....requiring support.

## 2013-07-05 NOTE — Discharge Summary (Signed)
Physician Discharge Summary  [name] ZOX:[id number] DOB: [birth year] DOA: 07/01/2013  PCP: Arnette Norris, MD  Admit date: 07/01/2013 Discharge date: 07/05/2013  Time spent: 35 minutes  Recommendations for Outpatient Follow-up:  1. Follow up with PCP in 3-5 days 2. Follow up with Hematology as an outpatient 3. Follow up with GI as scheduled   Recommendations for primary care physician for things to follow:  Repeat CBC, CMP  Discharge Diagnoses:  Principal Problem:   Choledocholithiasis with obstruction Active Problems:   HYPERTENSION   Thrombocytopenia, unspecified   UTI (lower urinary tract infection)   Abnormal LFTs  Discharge Condition: stable  Diet recommendation: regular   Filed Weights   07/01/13 0228 07/01/13 1015  Weight: 68.04 kg (150 lb) 69.3 kg (152 lb 12.5 oz)   History of present illness:  [name] is a 80 y.o. male With PMH significant for CAD, BPH, Cholecystectomy, Chronic Thrombocytopenia, who present complaining of abdominal pain that started day prior to admission, he describe pain like band like, lower abdomen, sharp in quality, 9/10, intermittent. Maybe associated after meals. He vomits once in the ED. He denies alcohol use. He relates chills, and dysuria.   Hospital Course:  Abdominal pain due to pancreatitis with obstructive jaundice and transaminitis- patient admitted to Med Surg floor and on presentation labs were significant for elevated lipase to 273 and elevated liver enzymes with AST 1054 and ALT 505 and bilirubin of 2.6. Patient underwent an MRCP which showed probable CBD stone. Gastroenterology was consulted and followed patient while hospitalized. He underwent an ERCP on 6/17 with removal of 2 stones. His lipase normalized and LFTs improving on discharge. I advised him to follow up with his PCP next week for repeat blood work.  HYPERTENSION - Continue metoprolol  Thrombocytopenia, unspecified Unclear etiology, chronic, he is followed  by Dr. Beryle Beams as an outpatient. He is thrombocytopenic on discharge likely in the setting of acute illness and active infection, no evidence of bleeding, advised him to hold Aspirin until he gets repeat CBC next week.  UTI (lower urinary tract infection) Continue rocephin, sensitive to cephalosporins and will discharge to complete a 7 day treatment with Vantin.  Procedures:  ERCP 6/17  1) Normal stomach, normal duodenum  2) Prior sphincterotomy at [name], covered by mucosal fold  3) Easy cannulation of bile duct with initial pancreatogram (partial - NL)  4) Cholangiogram showed 2 stones in 10 mm CBD, mobile. Otherwise NL s/p cholecystectomy  5) Biliary sphincterotomy sphincterotomy extension performed and 2 stones removed with balloon sweeps.  6) Occlusion cholangiogram negative at end   Consultations:  Gastroenterology   Discharge Exam: Filed Vitals:   07/04/13 1706 07/04/13 2134 07/05/13 0456 07/05/13 1450  BP: 170/84 159/60 131/56 130/60  Pulse: 62  66 68  Temp: 97.8 F (36.6 C) 99 F (37.2 C) 97.4 F (36.3 C) 97.8 F (36.6 C)  TempSrc: Oral Oral Oral Oral  Resp: 16 16 16 18   Height:      Weight:      SpO2: 97% 98% 97% 97%   General: NAD, mildly jaundiced Cardiovascular: RRR Respiratory: CTA biL  Discharge Instructions    Medication List    STOP taking these medications       aspirin EC 81 MG tablet      TAKE these medications       calcium carbonate 500 MG chewable tablet  Commonly known as:  TUMS - dosed in mg elemental calcium  Chew 2 tablets by mouth  once.     cefpodoxime 100 MG tablet  Commonly known as:  VANTIN  Take 1 tablet (100 mg total) by mouth 2 (two) times daily.     celecoxib 200 MG capsule  Commonly known as:  CELEBREX  Take 200 mg by mouth 2 (two) times daily as needed (for pain.).     clobetasol cream 0.05 %  Commonly known as:  TEMOVATE  Apply 1 application topically 2 (two) times daily. For whelps/itching     co-enzyme Q-10  30 MG capsule  Take 200 mg by mouth daily.     cyanocobalamin 1000 MCG/ML injection  Commonly known as:  (VITAMIN B-12)  Inject 1 mL (1,000 mcg total) into the muscle every 30 (thirty) days.     doxazosin 4 MG tablet  Commonly known as:  CARDURA  Take 2 mg by mouth every evening.     Fish Oil 1200 MG Caps  Take 1,200 mg by mouth daily.     metoprolol succinate 25 MG 24 hr tablet  Commonly known as:  TOPROL-XL  Take 12.5 mg by mouth daily.     nitroGLYCERIN 0.4 MG SL tablet  Commonly known as:  NITROSTAT  Place 1 tablet (0.4 mg total) under the tongue every 5 (five) minutes as needed. Up to 3 doses     tamsulosin 0.4 MG Caps capsule  Commonly known as:  FLOMAX  Take 0.4 mg by mouth daily after supper.           Follow-up Information   Follow up with [name], MD On 07/31/2013. (3:30 pm)    Specialty:  Gastroenterology   Contact information:   [address] [place] [place] [address] [phone]       Follow up with Arnette Norris, MD. Schedule an appointment as soon as possible for a visit in 4 days. (Repeat blood counts. Hold Aspirin until then.)    Specialty:  Family Medicine   Contact information:   [place] [address] [phone]       Follow up with Annia Belt, MD. Schedule an appointment as soon as possible for a visit in 1 week.   Specialty:  Oncology   Contact information:   [place] [place] [address] [phone]       The results of significant diagnostics from this hospitalization (including imaging, microbiology, ancillary and laboratory) are listed below for reference.    Significant Diagnostic Studies: Dg Eye Foreign Body  07/03/2013   CLINICAL DATA:  Metal working/exposure; clearance prior to MRI  EXAM: ORBITS FOR FOREIGN BODY - 2 VIEW  COMPARISON:  None.  FINDINGS: There is no evidence of metallic foreign body within the orbits. No significant bone abnormality identified.  IMPRESSION: No evidence of  metallic foreign body within the orbits.   Electronically Signed   By: Vinnie Langton M.D.   On: 07/03/2013 13:19   US Abdomen Complete  07/01/2013   CLINICAL DATA:  Abdominal pain.  EXAM: ULTRASOUND ABDOMEN COMPLETE  COMPARISON:  CT 07/01/2013  FINDINGS: Gallbladder:  Surgically removed.  Common bile duct:  Diameter: 0.9 cm  Liver:  No focal lesion identified. Within normal limits in parenchymal echogenicity.  IVC:  No abnormality visualized.  Pancreas:  Visualized portion unremarkable.  Spleen:  Size and appearance within normal limits.  Right Kidney:  Length: 12.1 cm. Echogenicity within normal limits. No mass or hydronephrosis visualized.  Left Kidney:  Length: 12.4 cm. Echogenic structure with shadowing in the left kidney lower  pole is consistent with a stone and corresponds with the previous CT findings. This stone roughly measures 0.7 cm. No evidence for hydronephrosis. Evidence for left parapelvic cysts and the largest measures up to 2.3 cm.  Abdominal aorta:  No aneurysm visualized.  Other findings:  None.  IMPRESSION: Post cholecystectomy.  No significant biliary dilatation.  Normal appearance of the liver.  Left kidney stone without hydronephrosis. Left parapelvic renal cysts.   Electronically Signed   By: Markus Daft M.D.   On: 07/01/2013 10:23   Ct Abdomen Pelvis W Contrast  07/01/2013   CLINICAL DATA:  Abdominal discomfort. History of pernicious anemia and thrombocytopenia.  EXAM: CT ABDOMEN AND PELVIS WITH CONTRAST  TECHNIQUE: Multidetector CT imaging of the abdomen and pelvis was performed using the standard protocol following bolus administration of intravenous contrast.  CONTRAST:  52mL OMNIPAQUE IOHEXOL 300 MG/ML SOLN, 160mL OMNIPAQUE IOHEXOL 300 MG/ML SOLN  COMPARISON:  CT of the abdomen and pelvis performed 08/12/2010  FINDINGS: Minimal bibasilar atelectasis is noted. Scattered coronary artery calcifications are seen.  The liver and spleen are unremarkable in appearance. The patient is  status post cholecystectomy, with clips noted at the gallbladder fossa. The pancreas and adrenal glands are unremarkable.  A 0.7 cm stone is noted near the lower pole of the left kidney. Nonspecific perinephric stranding is noted bilaterally. Bilateral parapelvic renal cysts are seen. There is no evidence of hydronephrosis. No obstructing ureteral stones are identified.  No free fluid is identified. The small bowel is unremarkable in appearance. The stomach is within normal limits. No acute vascular abnormalities are seen. Relatively diffuse calcification is seen along the abdominal aorta and its branches.  The appendix is normal in caliber and contains air, without evidence for appendicitis.  Diffuse diverticulosis is noted along the sigmoid colon, and mild diverticulosis is seen along the descending colon, without evidence of diverticulitis at this time. The colon is otherwise unremarkable in appearance.  The bladder is mildly distended and grossly unremarkable. The prostate is enlarged, measuring 5.5 cm in transverse dimension, with scattered calcification. No inguinal lymphadenopathy is seen.  No acute osseous abnormalities are identified. The right hip arthroplasty is incompletely imaged, but appears grossly unremarkable. Multilevel vacuum phenomenon is noted along the lower thoracic and lumbar spine.  IMPRESSION: 1. No acute abnormality seen to explain the patient's symptoms. 2. Diffuse diverticulosis along the sigmoid colon, and mild diverticulosis along the descending colon, without evidence of diverticulitis. 3. Relatively diffuse calcification along the abdominal aorta and its branches. 4. 0.7 cm left renal stone noted; no evidence of hydronephrosis. No obstructing ureteral stones seen. 5. Scattered coronary artery calcifications noted. 6. Enlarged prostate seen. 7. Diffuse degenerative change noted along the lower thoracic and lumbar spine.   Electronically Signed   By: Garald Balding M.D.   On:  07/01/2013 07:07   Mr 3d Recon At Scanner  07/03/2013   CLINICAL DATA:  Abnormal liver tests, question common bile duct stone. Prior cholecystectomy.  EXAM: MRI ABDOMEN WITHOUT  (INCLUDING MRCP)  TECHNIQUE: Multiplanar multisequence MR imaging of the abdomen was performed. Heavily T2-weighted images of the biliary and pancreatic ducts were obtained, and three-dimensional MRCP images were rendered by post processing.  COMPARISON:  CT 07/01/2013, ultrasound 07/01/2013  FINDINGS: Mild atelectasis at the lung bases.  No focal hepatic lesion. No intrahepatic biliary duct dilatation. Patient status post cholecystectomy. The common bile duct is normal caliber measuring 5 mm through the pancreatic head. The most distal aspect of the common bile duct  there is loss of signal intensity of the heavily T2 weighted MRCP sequences (series 4, image 56 and series 400, image 40). Defect also seen on image 31 of series 5.  The pancreatic duct is normal caliber. The pancreatic parenchyma is normal.  The spleen, adrenal glands, kidneys are normal. Bilateral parapelvic cysts.  The stomach and limited view of the small bowel and colon are unremarkable. There is a small hiatal hernia.  IMPRESSION: 1. Loss of signal intensity in the distal common bile duct could represent sludge, calculus, stricture, or artifact. Consider consider ERCP if patient bilirubin is elevated. 2. Post cholecystectomy without complication. 3. The pancreas is normal.   Electronically Signed   By: Suzy Bouchard M.D.   On: 07/03/2013 15:26   Dg Ercp  07/04/2013   CLINICAL DATA:  possible CBD stone  EXAM: ERCP  TECHNIQUE: Multiple spot images obtained with the fluoroscopic device and submitted for interpretation post-procedure.  COMPARISON:  None.  FINDINGS: A series of 6 fluoroscopic spot images submitted, demonstrating endoscopic cannulation of CBD with opacification and placement of a balloon tipped catheter. Filling defect is noted in the distal CBD on the  fourth image. There is evidence of balloon sweep through the CBD by final image with no persistent filling defect evident.  IMPRESSION: 1. Distal CBD filling defect suggesting possible retained calculus.  These images were submitted for radiologic interpretation only. Please see the procedural report for the amount of contrast and the fluoroscopy time utilized.   Electronically Signed   By: Arne Cleveland M.D.   On: 07/04/2013 16:14   Mr Lambert Mody Cm/mrcp  07/03/2013   CLINICAL DATA:  Abnormal liver tests, question common bile duct stone. Prior cholecystectomy.  EXAM: MRI ABDOMEN WITHOUT  (INCLUDING MRCP)  TECHNIQUE: Multiplanar multisequence MR imaging of the abdomen was performed. Heavily T2-weighted images of the biliary and pancreatic ducts were obtained, and three-dimensional MRCP images were rendered by post processing.  COMPARISON:  CT 07/01/2013, ultrasound 07/01/2013  FINDINGS: Mild atelectasis at the lung bases.  No focal hepatic lesion. No intrahepatic biliary duct dilatation. Patient status post cholecystectomy. The common bile duct is normal caliber measuring 5 mm through the pancreatic head. The most distal aspect of the common bile duct there is loss of signal intensity of the heavily T2 weighted MRCP sequences (series 4, image 56 and series 400, image 40). Defect also seen on image 31 of series 5.  The pancreatic duct is normal caliber. The pancreatic parenchyma is normal.  The spleen, adrenal glands, kidneys are normal. Bilateral parapelvic cysts.  The stomach and limited view of the small bowel and colon are unremarkable. There is a small hiatal hernia.  IMPRESSION: 1. Loss of signal intensity in the distal common bile duct could represent sludge, calculus, stricture, or artifact. Consider consider ERCP if patient bilirubin is elevated. 2. Post cholecystectomy without complication. 3. The pancreas is normal.   Electronically Signed   By: Suzy Bouchard M.D.   On: 07/03/2013 15:26     Microbiology: Recent Results (from the past 240 hour(s))  CULTURE, BLOOD (ROUTINE X 2)     Status: None   Collection Time    07/01/13  8:05 AM      Result Value Ref Range Status   Specimen Description BLOOD LEFT ARM   Final   Special Requests BOTTLES DRAWN AEROBIC AND ANAEROBIC [facility]   Final   Culture  Setup Time     Final   Value: 07/01/2013 16:55  Performed at [facility]     Final   Value:        BLOOD CULTURE RECEIVED NO GROWTH TO DATE CULTURE WILL BE HELD FOR 5 DAYS BEFORE ISSUING A FINAL NEGATIVE REPORT     Performed at [facility]   Report Status PENDING   Incomplete  CULTURE, BLOOD (ROUTINE X 2)     Status: None   Collection Time    07/01/13  9:00 AM      Result Value Ref Range Status   Specimen Description BLOOD RIGHT FOREARM  3 ML IN [facility] BOTTLE   Final   Special Requests Normal   Final   Culture  Setup Time     Final   Value: 07/01/2013 16:55     Performed at [facility]   Culture     Final   Value:        BLOOD CULTURE RECEIVED NO GROWTH TO DATE CULTURE WILL BE HELD FOR 5 DAYS BEFORE ISSUING A FINAL NEGATIVE REPORT     Performed at [facility]   Report Status PENDING   Incomplete  URINE CULTURE     Status: None   Collection Time    07/01/13  3:08 PM      Result Value Ref Range Status   Specimen Description URINE, RANDOM   Final   Special Requests NONE   Final   Culture  Setup Time     Final   Value: 07/02/2013 01:00     Performed at [facility]     Final   Value: 80,000 COLONIES/ML     Performed at [facility]   Culture     Final   Value: ESCHERICHIA COLI     Performed at [facility]   Report Status 07/04/2013 FINAL   Final   Organism ID, Bacteria ESCHERICHIA COLI   Final     Labs: Basic Metabolic Panel:  Recent Labs Lab 07/01/13 0441 07/02/13 0350 07/05/13 0355  NA 143 141 144  K 3.9 4.0 3.7  CL 104 107 109  CO2 25 23 24   GLUCOSE 139* 82 117*   BUN 12 7 3*  CREATININE 0.80 0.93 0.71  CALCIUM 9.2 8.3* 8.0*   Liver Function Tests:  Recent Labs Lab 07/01/13 0441 07/02/13 0350 07/03/13 1000 07/05/13 0355  AST 1054* 218* 76* 69*  ALT 505* 347* 216* 150*  ALKPHOS 152* 120* 149* 188*  BILITOT 2.6* 5.9* 4.6* 4.7*  PROT 6.1 4.9* 5.1* 4.6*  ALBUMIN 3.8 2.9* 2.9* 2.4*    Recent Labs Lab 07/01/13 0441 07/02/13 0350 07/05/13 0355  LIPASE 273* 17 34  AMYLASE  --  53 55   CBC:  Recent Labs Lab 07/01/13 0441 07/02/13 0350 07/05/13 0355  WBC 9.5 7.2 6.9  HGB 15.2 13.2 13.0  HCT 45.1 40.3 39.3  MCV 83.2 85.6 84.5  PLT 117* 76* 47*   Signed:  GHERGHE, COSTIN  [facility] 07/05/2013, 4:16 PM

## 2013-07-05 NOTE — Telephone Encounter (Signed)
Pt called to get hospital follow up appointment He was admitted to [facility] 6/14 and was discharged 6/18 made follow up appointment 6/23  Pt wanted dr Deborra Medina to know that on  06/22/13 he had his b12 shot 06/26/13 he had an appointment with dr Beryle Beams @ [facility] and his blood count was ok 07/01/13 was admitted to [facility] with low blood count.  Pt didn't know what his blood count was when he was admitted

## 2013-07-05 NOTE — Discharge Instructions (Signed)
You were cared for by a hospitalist during your hospital stay. If you have any questions about your discharge medications or the care you received while you were in the hospital after you are discharged, you can call the unit and asked to speak with the hospitalist on call if the hospitalist that took care of you is not available. Once you are discharged, your primary care physician will handle any further medical issues. Please note that NO REFILLS for any discharge medications will be authorized once you are discharged, as it is imperative that you return to your primary care physician (or establish a relationship with a primary care physician if you do not have one) for your aftercare needs so that they can reassess your need for medications and monitor your lab values.     If you do not have a primary care physician, you can call [phone] for a physician referral.  Follow with Primary MD Arnette Norris, MD in 5-7 days  Hold your Aspirin until seen by primary MD and have your CBC rechecked. Please take your d/c instructions with you.  Get CBC, CMP checked by your doctor and again as further instructed.  Get a 2 view Chest X ray done next visit if you had Pneumonia of Lung problems at the [facility] reviewed and adjusted.  Please request your Prim.MD to go over all Hospital Tests and Procedure/Radiological results at the follow up, please get all Hospital records sent to your Prim MD by signing hospital release before you go home.  Activity: As tolerated with Full fall precautions use walker/cane & assistance as needed  Diet: regular  For Heart failure patients - Check your Weight same time everyday, if you gain over 2 pounds, or you develop in leg swelling, experience more shortness of breath or chest pain, call your Primary MD immediately. Follow Cardiac Low Salt Diet and 1.8 lit/day fluid restriction.  Disposition Home  If you experience worsening of your admission symptoms,  develop shortness of breath, life threatening emergency, suicidal or homicidal thoughts you must seek medical attention immediately by calling 911 or calling your MD immediately  if symptoms less severe.  You Must read complete instructions/literature along with all the possible adverse reactions/side effects for all the Medicines you take and that have been prescribed to you. Take any new Medicines after you have completely understood and accpet all the possible adverse reactions/side effects.   Do not drive and provide baby sitting services if your were admitted for syncope or siezures until you have seen by Primary MD or a Neurologist and advised to do so again.  Do not drive when taking Pain medications.   Do not take more than prescribed Pain, Sleep and Anxiety Medications  Special Instructions: If you have smoked or chewed Tobacco  in the last 2 yrs please stop smoking, stop any regular Alcohol  and or any Recreational drug use.  Wear Seat belts while driving.

## 2013-07-05 NOTE — Progress Notes (Signed)
     [place] [facility] Progress Note  Subjective:  Feels well.  No abdominal pain.  Objective:  Vital signs in last 24 hours: Temp:  [97.4 F (36.3 C)-99 F (37.2 C)] 97.4 F (36.3 C) (06/18 0456) Pulse Rate:  [62-68] 66 (06/18 0456) Resp:  [15-18] 16 (06/18 0456) BP: (115-170)/(45-88) 131/56 mmHg (06/18 0456) SpO2:  [97 %-100 %] 97 % (06/18 0456) Last BM Date: 07/04/13 General:  Alert, Well-developed, in NAD Heart:  Regular rate and rhythm; no murmurs Pulm:  CTAB.  No W/R/R. Abdomen:  Soft, non-distended. Normal bowel sounds.  Non-tender. Extremities:  Without edema. Neurologic:  Alert and  oriented x4;  grossly normal neurologically. Psych:  Alert and cooperative. Normal mood and affect.  Lab Results:  Recent Labs  07/05/13 0355  WBC 6.9  HGB 13.0  HCT 39.3  PLT 47*   BMET  Recent Labs  07/05/13 0355  NA 144  K 3.7  CL 109  CO2 24  GLUCOSE 117*  BUN 3*  CREATININE 0.71  CALCIUM 8.0*   LFT  Recent Labs  07/03/13 1000 07/05/13 0355  PROT 5.1* 4.6*  ALBUMIN 2.9* 2.4*  AST 76* 69*  ALT 216* 150*  ALKPHOS 149* 188*  BILITOT 4.6* 4.7*  BILIDIR 3.8*  --   IBILI 0.8  --       Dg Ercp  07/04/2013   CLINICAL DATA:  possible CBD stone  EXAM: ERCP  TECHNIQUE: Multiple spot images obtained with the fluoroscopic device and submitted for interpretation post-procedure.  COMPARISON:  None.  FINDINGS: A series of 6 fluoroscopic spot images submitted, demonstrating endoscopic cannulation of CBD with opacification and placement of a balloon tipped catheter. Filling defect is noted in the distal CBD on the fourth image. There is evidence of balloon sweep through the CBD by final image with no persistent filling defect evident.  IMPRESSION: 1. Distal CBD filling defect suggesting possible retained calculus.  These images were submitted for radiologic interpretation only. Please see the procedural report for the amount of contrast and the fluoroscopy time  utilized.   Electronically Signed   By: Arne Cleveland M.D.   On: 07/04/2013 16:14     Assessment / Plan: -Choledocholithiasis:  S/p ERCP with sphincterotomy extended and balloon sweep with stone removal x 2. -Elevated LFT's:  Secondary to above.  Stable, may take a couple of weeks to return to normal.    *Will advance diet to regular.  If he tolerates that well, which I suspect that he will, then he can be discharged later today. *Follow-up in our office has been arranged and is in discharge instructions.  Will recheck LFT's in 2 weeks.   LOS: 4 days   ZEHR, JESSICA D.  07/05/2013, 9:09 AM  Pager number [place] Attending  I have also seen and assessed the patient and agree with the above note. He should be able to go home today.  Gatha Mayer, MD, [facility] [phone] (pager) 07/05/2013 12:42 PM

## 2013-07-07 LAB — CULTURE, BLOOD (ROUTINE X 2)
CULTURE: NO GROWTH
Culture: NO GROWTH
SPECIAL REQUESTS: NORMAL

## 2013-07-10 ENCOUNTER — Other Ambulatory Visit: Payer: Self-pay | Admitting: Oncology

## 2013-07-10 ENCOUNTER — Ambulatory Visit (INDEPENDENT_AMBULATORY_CARE_PROVIDER_SITE_OTHER): Payer: Medicare Other | Admitting: Family Medicine

## 2013-07-10 ENCOUNTER — Telehealth: Payer: Self-pay | Admitting: *Deleted

## 2013-07-10 ENCOUNTER — Encounter: Payer: Self-pay | Admitting: Family Medicine

## 2013-07-10 VITALS — BP 138/82 | HR 86 | Temp 98.0°F | Wt 146.5 lb

## 2013-07-10 DIAGNOSIS — D696 Thrombocytopenia, unspecified: Secondary | ICD-10-CM

## 2013-07-10 DIAGNOSIS — N200 Calculus of kidney: Secondary | ICD-10-CM | POA: Insufficient documentation

## 2013-07-10 DIAGNOSIS — R3915 Urgency of urination: Secondary | ICD-10-CM

## 2013-07-10 DIAGNOSIS — I10 Essential (primary) hypertension: Secondary | ICD-10-CM

## 2013-07-10 DIAGNOSIS — F4321 Adjustment disorder with depressed mood: Secondary | ICD-10-CM

## 2013-07-10 DIAGNOSIS — K8043 Calculus of bile duct with acute cholecystitis with obstruction: Secondary | ICD-10-CM

## 2013-07-10 DIAGNOSIS — R7989 Other specified abnormal findings of blood chemistry: Secondary | ICD-10-CM

## 2013-07-10 DIAGNOSIS — N390 Urinary tract infection, site not specified: Secondary | ICD-10-CM

## 2013-07-10 DIAGNOSIS — R945 Abnormal results of liver function studies: Secondary | ICD-10-CM

## 2013-07-10 LAB — CBC WITH DIFFERENTIAL/PLATELET
HCT: 42.8 % (ref 39.0–52.0)
Hemoglobin: 14.1 g/dL (ref 13.0–17.0)
MCHC: 32.8 g/dL (ref 30.0–36.0)
MCV: 85.6 fl (ref 78.0–100.0)
Platelets: 42 10*3/uL — CL (ref 150.0–400.0)
RBC: 5 Mil/uL (ref 4.22–5.81)
RDW: 16.8 % — ABNORMAL HIGH (ref 11.5–15.5)
WBC: 7.4 10*3/uL (ref 4.0–10.5)

## 2013-07-10 LAB — POCT URINALYSIS DIPSTICK
GLUCOSE UA: NEGATIVE
Ketones, UA: NEGATIVE
Nitrite, UA: NEGATIVE
PH UA: 6.5
Spec Grav, UA: 1.01
UROBILINOGEN UA: 0.2

## 2013-07-10 LAB — COMPREHENSIVE METABOLIC PANEL
ALBUMIN: 3.3 g/dL — AB (ref 3.5–5.2)
ALK PHOS: 419 U/L — AB (ref 39–117)
ALT: 199 U/L — ABNORMAL HIGH (ref 0–53)
AST: 138 U/L — ABNORMAL HIGH (ref 0–37)
BUN: 7 mg/dL (ref 6–23)
CO2: 28 mEq/L (ref 19–32)
Calcium: 9.1 mg/dL (ref 8.4–10.5)
Chloride: 104 mEq/L (ref 96–112)
Creatinine, Ser: 0.8 mg/dL (ref 0.4–1.5)
GFR: 107.12 mL/min (ref >60.00)
GLUCOSE: 116 mg/dL — AB (ref 70–99)
Potassium: 4 mEq/L (ref 3.5–5.1)
Sodium: 141 mEq/L (ref 135–145)
Total Bilirubin: 4.5 mg/dL — ABNORMAL HIGH (ref 0.2–1.2)
Total Protein: 5.6 g/dL — ABNORMAL LOW (ref 6.0–8.3)

## 2013-07-10 LAB — PSA, MEDICARE: PSA: 6.99 ng/mL — AB (ref 0.10–4.00)

## 2013-07-10 LAB — LIPASE: LIPASE: 48 U/L (ref 11.0–59.0)

## 2013-07-10 NOTE — Assessment & Plan Note (Signed)
UA pos for trace blood, trace LE and large bili. Will send for cx. S/p 7 day course of appropriate abx. Dysuria has resolved. Orders Placed This Encounter  Procedures  . CBC with Differential  . Comprehensive metabolic panel  . Lipase  . PSA, Medicare

## 2013-07-10 NOTE — Progress Notes (Signed)
Very pleasant 80 yo male with h/o CAD, BPH, cholecystectomy, pernicious anemia, here for hospital follow up. Admitted to [facility] 6/14/-6/18.  Notes reviewed.    Presented to ER with severe abdominal pain, jaundice.  Had not been eating much since his wife died on June 28, 2022.  Describes pain like nothing he ever felt- like a band across back and abdomen.  Vomited once in ER and felt better- has not had pain since.  Lipase elevated to 273,  LFTs elevated as well.  He was admitted to Med Surg floor and underwent an MRCP which showed probable CBD stone. Gastroenterology was consulted and followed patient while hospitalized. He underwent an ERCP on 6/17 with removal of 2 stones. His lipase normalized and LFTs improving on discharge.   H/o pernicious anemia- followed by Dr. Beryle Beams.  Thrombocytopenic at DC.  Etiology unclear. Lab Results  Component Value Date   WBC 6.9 07/05/2013   HGB 13.0 07/05/2013   HCT 39.3 07/05/2013   MCV 84.5 07/05/2013   PLT 47* 07/05/2013     Holding ASA.  Has not had nose bleeds or blood in stool.  UTI (lower urinary tract infection)- urine cx reviewed, sensitive to cephalosporins was discharged on Vantin to complete a 7 day treatment. Dr. Gaynelle Arabian is urologist.  Still having dark colored urine but dysuria has resolved.  Also still has increased urgency- was incontinent last night.  Procedures:  ERCP 6/17  1) Normal stomach, normal duodenum  2) Prior sphincterotomy at [place], covered by mucosal fold  3) Easy cannulation of bile duct with initial pancreatogram (partial - NL)  4) Cholangiogram showed 2 stones in 10 mm CBD, mobile. Otherwise NL s/p cholecystectomy  5) Biliary sphincterotomy sphincterotomy extension performed and 2 stones removed with balloon sweeps.  6) Occlusion cholangiogram negative at end    US Abdomen Complete  07/01/2013 CLINICAL DATA: Abdominal pain. EXAM: ULTRASOUND ABDOMEN COMPLETE COMPARISON: CT 07/01/2013 FINDINGS: Gallbladder: Surgically  removed. Common bile duct: Diameter: 0.9 cm Liver: No focal lesion identified. Within normal limits in parenchymal echogenicity. IVC: No abnormality visualized. Pancreas: Visualized portion unremarkable. Spleen: Size and appearance within normal limits. Right Kidney: Length: 12.1 cm. Echogenicity within normal limits. No mass or hydronephrosis visualized. Left Kidney: Length: 12.4 cm. Echogenic structure with shadowing in the left kidney lower pole is consistent with a stone and corresponds with the previous CT findings. This stone roughly measures 0.7 cm. No evidence for hydronephrosis. Evidence for left parapelvic cysts and the largest measures up to 2.3 cm. Abdominal aorta: No aneurysm visualized. Other findings: None. IMPRESSION: Post cholecystectomy. No significant biliary dilatation. Normal appearance of the liver. Left kidney stone without hydronephrosis. Left parapelvic renal cysts. Electronically Signed By: Markus Daft M.D. On: 07/01/2013 10:23  Ct Abdomen Pelvis W Contrast  07/01/2013 CLINICAL DATA: Abdominal discomfort. History of pernicious anemia and thrombocytopenia. EXAM: CT ABDOMEN AND PELVIS WITH CONTRAST TECHNIQUE: Multidetector CT imaging of the abdomen and pelvis was performed using the standard protocol following bolus administration of intravenous contrast. CONTRAST: 83m OMNIPAQUE IOHEXOL 300 MG/ML SOLN, 1080mOMNIPAQUE IOHEXOL 300 MG/ML SOLN COMPARISON: CT of the abdomen and pelvis performed 08/12/2010 FINDINGS: Minimal bibasilar atelectasis is noted. Scattered coronary artery calcifications are seen. The liver and spleen are unremarkable in appearance. The patient is status post cholecystectomy, with clips noted at the gallbladder fossa. The pancreas and adrenal glands are unremarkable. A 0.7 cm stone is noted near the lower pole of the left kidney. Nonspecific perinephric stranding is noted bilaterally. Bilateral parapelvic renal cysts are seen.  There is no evidence of hydronephrosis. No  obstructing ureteral stones are identified. No free fluid is identified. The small bowel is unremarkable in appearance. The stomach is within normal limits. No acute vascular abnormalities are seen. Relatively diffuse calcification is seen along the abdominal aorta and its branches. The appendix is normal in caliber and contains air, without evidence for appendicitis. Diffuse diverticulosis is noted along the sigmoid colon, and mild diverticulosis is seen along the descending colon, without evidence of diverticulitis at this time. The colon is otherwise unremarkable in appearance. The bladder is mildly distended and grossly unremarkable. The prostate is enlarged, measuring 5.5 cm in transverse dimension, with scattered calcification. No inguinal lymphadenopathy is seen. No acute osseous abnormalities are identified. The right hip arthroplasty is incompletely imaged, but appears grossly unremarkable. Multilevel vacuum phenomenon is noted along the lower thoracic and lumbar spine. IMPRESSION: 1. No acute abnormality seen to explain the patient's symptoms. 2. Diffuse diverticulosis along the sigmoid colon, and mild diverticulosis along the descending colon, without evidence of diverticulitis. 3. Relatively diffuse calcification along the abdominal aorta and its branches. 4. 0.7 cm left renal stone noted; no evidence of hydronephrosis. No obstructing ureteral stones seen. 5. Scattered coronary artery calcifications noted. 6. Enlarged prostate seen. 7. Diffuse degenerative change noted along the lower thoracic and lumbar spine. Electronically Signed By: Garald Balding M.D. On: 07/01/2013 07:07  Mr 3d Recon At Scanner  07/03/2013 CLINICAL DATA: Abnormal liver tests, question common bile duct stone. Prior cholecystectomy. EXAM: MRI ABDOMEN WITHOUT (INCLUDING MRCP) TECHNIQUE: Multiplanar multisequence MR imaging of the abdomen was performed. Heavily T2-weighted images of the biliary and pancreatic ducts were obtained, and  three-dimensional MRCP images were rendered by post processing. COMPARISON: CT 07/01/2013, ultrasound 07/01/2013 FINDINGS: Mild atelectasis at the lung bases. No focal hepatic lesion. No intrahepatic biliary duct dilatation. Patient status post cholecystectomy. The common bile duct is normal caliber measuring 5 mm through the pancreatic head. The most distal aspect of the common bile duct there is loss of signal intensity of the heavily T2 weighted MRCP sequences (series 4, image 56 and series 400, image 40). Defect also seen on image 31 of series 5. The pancreatic duct is normal caliber. The pancreatic parenchyma is normal. The spleen, adrenal glands, kidneys are normal. Bilateral parapelvic cysts. The stomach and limited view of the small bowel and colon are unremarkable. There is a small hiatal hernia. IMPRESSION: 1. Loss of signal intensity in the distal common bile duct could represent sludge, calculus, stricture, or artifact. Consider consider ERCP if patient bilirubin is elevated. 2. Post cholecystectomy without complication. 3. The pancreas is normal. Electronically Signed By: Suzy Bouchard M.D. On: 07/03/2013 15:26  Dg Ercp  07/04/2013 CLINICAL DATA: possible CBD stone EXAM: ERCP TECHNIQUE: Multiple spot images obtained with the fluoroscopic device and submitted for interpretation post-procedure. COMPARISON: None. FINDINGS: A series of 6 fluoroscopic spot images submitted, demonstrating endoscopic cannulation of CBD with opacification and placement of a balloon tipped catheter. Filling defect is noted in the distal CBD on the fourth image. There is evidence of balloon sweep through the CBD by final image with no persistent filling defect evident. IMPRESSION: 1. Distal CBD filling defect suggesting possible retained calculus. These images were submitted for radiologic interpretation only. Please see the procedural report for the amount of contrast and the fluoroscopy time utilized. Electronically Signed  By: Arne Cleveland M.D. On: 07/04/2013 16:14  Mr Lambert Mody Cm/mrcp  07/03/2013 CLINICAL DATA: Abnormal liver tests, question common bile duct stone. Prior cholecystectomy.  EXAM: MRI ABDOMEN WITHOUT (INCLUDING MRCP) TECHNIQUE: Multiplanar multisequence MR imaging of the abdomen was performed. Heavily T2-weighted images of the biliary and pancreatic ducts were obtained, and three-dimensional MRCP images were rendered by post processing. COMPARISON: CT 07/01/2013, ultrasound 07/01/2013 FINDINGS: Mild atelectasis at the lung bases. No focal hepatic lesion. No intrahepatic biliary duct dilatation. Patient status post cholecystectomy. The common bile duct is normal caliber measuring 5 mm through the pancreatic head. The most distal aspect of the common bile duct there is loss of signal intensity of the heavily T2 weighted MRCP sequences (series 4, image 56 and series 400, image 40). Defect also seen on image 31 of series 5. The pancreatic duct is normal caliber. The pancreatic parenchyma is normal. The spleen, adrenal glands, kidneys are normal. Bilateral parapelvic cysts. The stomach and limited view of the small bowel and colon are unremarkable. There is a small hiatal hernia. IMPRESSION: 1. Loss of signal intensity in the distal common bile duct could represent sludge, calculus, stricture, or artifact. Consider consider ERCP if patient bilirubin is elevated. 2. Post cholecystectomy without complication. 3. The pancreas is normal. Electronically Signed By: Suzy Bouchard M.D. On: 07/03/2013 15:26    Current Outpatient Prescriptions on File Prior to Visit  Medication Sig Dispense Refill  . calcium carbonate (TUMS - DOSED IN MG ELEMENTAL CALCIUM) 500 MG chewable tablet Chew 2 tablets by mouth once.      . celecoxib (CELEBREX) 200 MG capsule Take 200 mg by mouth 2 (two) times daily as needed (for pain.).      Marland Kitchen clobetasol cream (TEMOVATE) 9.76 % Apply 1 application topically 2 (two) times daily. For whelps/itching       . co-enzyme Q-10 30 MG capsule Take 200 mg by mouth daily.      . cyanocobalamin (,VITAMIN B-12,) 1000 MCG/ML injection Inject 1 mL (1,000 mcg total) into the muscle every 30 (thirty) days.  1 mL  3  . doxazosin (CARDURA) 4 MG tablet Take 2 mg by mouth every evening.       . metoprolol succinate (TOPROL-XL) 25 MG 24 hr tablet Take 12.5 mg by mouth daily.      . nitroGLYCERIN (NITROSTAT) 0.4 MG SL tablet Place 1 tablet (0.4 mg total) under the tongue every 5 (five) minutes as needed. Up to 3 doses  25 tablet  11  . Omega-3 Fatty Acids (FISH OIL) 1200 MG CAPS Take 1,200 mg by mouth daily.      . Tamsulosin HCl (FLOMAX) 0.4 MG CAPS Take 0.4 mg by mouth daily after supper.       No current facility-administered medications on file prior to visit.    Allergies  Allergen Reactions  . Penicillins Other (See Comments)    Whelps, passed out  . Sulfamethoxazole-Trimethoprim Rash  . Sulfonamide Derivatives Rash    Past Medical History  Diagnosis Date  . Coronary atherosclerosis of unspecified type of vessel, native or graft   . Personal history of colonic polyps     adenomatous  . Hypertrophy of prostate without urinary obstruction and other lower urinary tract symptoms (LUTS)   . Unspecified essential hypertension   . Other and unspecified hyperlipidemia   . Diverticulosis of colon (without mention of hemorrhage)   . Degeneration of intervertebral disc, site unspecified   . Cluster headache   . Pernicious anemia   . Neurogenic dysphagia   . Osteoarthritis   . CHF (congestive heart failure)   . Unspecified sleep apnea     does not  have cpap-was not able to tolerate  . Prostate enlargement     urinary frequency, nocturia  . Swallowing difficulty   . Thrombocytopenia, unspecified 09/01/2012  . Choledocholithiasis with obstruction 07/02/2013    Past Surgical History  Procedure Laterality Date  . Inguinal herniorrhapy  2003  . Uvulectomy  1993  . Knee arthroscopy      5 times  .  Total knee arthroplasty  2003  . Rotator cuff repair Right 2006  . Biopsy prostate  2008    Benign  . Tonsillectomy    . Total hip arthroplasty Right 2011  . Hernia repair  2004    RIH  . Bone marrow biopsy  2011  . Nasal endoscopy  2012  . Cholecystectomy  11/24/2010    Procedure: LAPAROSCOPIC CHOLECYSTECTOMY WITH INTRAOPERATIVE CHOLANGIOGRAM;  Surgeon: Earnstine Regal, MD;  Location: [facility];  Service: General;  Laterality: N/A;  c-arm  . Ercp N/A 07/04/2013    Procedure: ENDOSCOPIC RETROGRADE CHOLANGIOPANCREATOGRAPHY (ERCP);  Surgeon: Gatha Mayer, MD;  Location: [facility];  Service: Endoscopy;  Laterality: N/A;  MAC if available    Family History  Problem Relation Age of Onset  . Colon cancer Father 67  . Cancer Father     colon  . Peripheral vascular disease Mother     History   Social History  . Marital Status: Married    Spouse Name: N/A    Number of Children: 2  . Years of Education: N/A   Occupational History  . retired    Social History Main Topics  . Smoking status: Former Smoker    Types: Pipe    Quit date: 01/18/1966  . Smokeless tobacco: Never Used  . Alcohol Use: No  . Drug Use: No  . Sexual Activity: Not on file   Other Topics Concern  . Not on file   Social History Narrative   Desires DNR.   The PMH, PSH, Social History, Family History, Medications, and allergies have been reviewed in [facility], and have been updated if relevant.  ROS:  See HPI +sadness and insomnia- but denies SI or HI- strong faith- "in God's hands." +itching  No nausea or vomiting No abdominal pain   Physical exam: BP 138/82  Pulse 86  Temp(Src) 98 F (36.7 C) (Oral)  Wt 146 lb 8 oz (66.452 kg)  SpO2 96% Wt Readings from Last 3 Encounters:  07/10/13 146 lb 8 oz (66.452 kg)  07/01/13 152 lb 12.5 oz (69.3 kg)  07/01/13 152 lb 12.5 oz (69.3 kg)   Gen:  Alert, pleasant, NAD HEENT:  No scleral icterus Abd:  Soft, NT, +BS No rebound or guarding Psych:  Good eye  contact, appears sad but appropriate Skin:  + excoriations on back

## 2013-07-10 NOTE — Assessment & Plan Note (Signed)
Small stone. Did advise f/u with Dr. Gaynelle Arabian- per pt, already scheduled for later this month.

## 2013-07-10 NOTE — Progress Notes (Signed)
Pre visit review using our clinic review tool, if applicable. No additional management support is needed unless otherwise documented below in the visit note. 

## 2013-07-10 NOTE — Assessment & Plan Note (Signed)
He will be attending grief counseling at cancer center and with hospice with his daughter.  Symptoms very appropriate at this point.

## 2013-07-10 NOTE — Assessment & Plan Note (Signed)
Currently asymptomatic. Appetite slowly improving- complicated by acute grief. Has follow up with Dr. Olevia Perches on 07/31/13.

## 2013-07-10 NOTE — Addendum Note (Signed)
Addended by: Modena Nunnery on: 07/10/2013 08:09 AM   Modules accepted: Orders

## 2013-07-10 NOTE — Assessment & Plan Note (Signed)
Recheck CMET today. Likely transient.

## 2013-07-10 NOTE — Telephone Encounter (Signed)
[name] from [facility] contacted office and states that pt has critical lab; platelet count 40,000. Dr Deborra Medina is out of office for the remainder of the day, but was reached by cell phone and advised of labs. At her request, information forwarded to Dr Waymon Budge, pts hematologist.

## 2013-07-10 NOTE — Patient Instructions (Signed)
Great to see you.  I will call you with your results.  

## 2013-07-10 NOTE — Assessment & Plan Note (Signed)
Recheck CBC today. 

## 2013-07-11 ENCOUNTER — Telehealth: Payer: Self-pay | Admitting: Family Medicine

## 2013-07-11 LAB — URINE CULTURE
Colony Count: NO GROWTH
Organism ID, Bacteria: NO GROWTH

## 2013-07-11 NOTE — Telephone Encounter (Signed)
Relevant patient education assigned to patient using Emmi. ° °

## 2013-07-12 ENCOUNTER — Other Ambulatory Visit (INDEPENDENT_AMBULATORY_CARE_PROVIDER_SITE_OTHER): Payer: Medicare Other

## 2013-07-12 ENCOUNTER — Encounter: Payer: Self-pay | Admitting: *Deleted

## 2013-07-12 DIAGNOSIS — D696 Thrombocytopenia, unspecified: Secondary | ICD-10-CM

## 2013-07-12 LAB — SAVE SMEAR

## 2013-07-12 LAB — CBC WITH DIFFERENTIAL/PLATELET
BASOS ABS: 0 10*3/uL (ref 0.0–0.1)
BASOS PCT: 0 % (ref 0–1)
Eosinophils Absolute: 0.1 10*3/uL (ref 0.0–0.7)
Eosinophils Relative: 1 % (ref 0–5)
HEMATOCRIT: 42.4 % (ref 39.0–52.0)
Hemoglobin: 14.2 g/dL (ref 13.0–17.0)
Lymphocytes Relative: 14 % (ref 12–46)
Lymphs Abs: 1 10*3/uL (ref 0.7–4.0)
MCH: 28.7 pg (ref 26.0–34.0)
MCHC: 33.5 g/dL (ref 30.0–36.0)
MCV: 85.8 fL (ref 78.0–100.0)
MONO ABS: 1.8 10*3/uL — AB (ref 0.1–1.0)
MONOS PCT: 27 % — AB (ref 3–12)
Neutro Abs: 3.9 10*3/uL (ref 1.7–7.7)
Neutrophils Relative %: 58 % (ref 43–77)
Platelets: 38 10*3/uL — ABNORMAL LOW (ref 150–400)
RBC: 4.94 MIL/uL (ref 4.22–5.81)
RDW: 16.3 % — ABNORMAL HIGH (ref 11.5–15.5)
WBC: 6.8 10*3/uL (ref 4.0–10.5)

## 2013-07-13 ENCOUNTER — Encounter: Payer: Self-pay | Admitting: Oncology

## 2013-07-13 DIAGNOSIS — D696 Thrombocytopenia, unspecified: Secondary | ICD-10-CM

## 2013-07-13 NOTE — Progress Notes (Unsigned)
Patient ID: [name], male   DOB: 04-24-1933, 80 y.o.   MRN: [id number] I called the patient this afternoon to review recent lab results with respect to persistent and slowly progressive thrombocytopenia. I recently saw him for a routine followup visit on June 9. Hemoglobin was 16. Platelet count 199,000. 5 days after that visit on June 14 he presented with abdominal pain and jaundice and was admitted to the hospital. He was found to have retained common bile duct stones retrieved at the time of an ERCP procedure. His platelet count dropped down to 47,000. Repeat as an outpatient on June 23 was 42,000.  His primary care physician contacted me by e-mail to request further evaluation. In the past, when platelet count fell down unexpectedly, review of the peripheral blood film revealed in vitro clumping of the platelets which spuriously lowered the count.  I had the patient come in for a blood test on June 25. Platelets 38,000. Hemoglobin 14.2. White count 6800. He has a normal total white count but a chronic peripheral blood monocytosis. I was concerned twith the monocytosis in the past and incomplete recovery of his counts after B12 replacement and did a bone marrow biopsy on him back in October 2011 which was not diagnostic for myelodysplastic syndrome or other malignant bone marrow condition. Monocytosis has remained stable over time including count done yesterday:  27% monocytes total white count 6800. On review of the peripheral blood film today, platelets are decreased at this time consistent with the machine count with 3-6 platelets per high-power field.  My differential diagnosis at this point includes transient consumption from recent biliary inflammation given the fact that he had a normal platelet count just 17 days ago. I would consider ITP as well. Although MDS also possible, this seems unlikely with a normal hemoglobin and no change in the chronic monocytosis. Plan: I discussed close  observation versus a trial of prednisone with the patient. He reports no bleeding at this time. In the absence of bleeding, the usual starting point for treatment in ITP is a platelet count of 30,000 or below. The patient would prefer to monitor blood counts weekly and not initiate steroids at this time. I think this is perfectly reasonable. I will arrange for counts to be done at our clinic.

## 2013-07-17 ENCOUNTER — Other Ambulatory Visit (INDEPENDENT_AMBULATORY_CARE_PROVIDER_SITE_OTHER): Payer: Medicare Other

## 2013-07-17 ENCOUNTER — Telehealth: Payer: Self-pay | Admitting: *Deleted

## 2013-07-17 DIAGNOSIS — D696 Thrombocytopenia, unspecified: Secondary | ICD-10-CM

## 2013-07-17 LAB — CBC WITH DIFFERENTIAL/PLATELET
BASOS ABS: 0 10*3/uL (ref 0.0–0.1)
Basophils Relative: 0 % (ref 0–1)
EOS ABS: 0.1 10*3/uL (ref 0.0–0.7)
EOS PCT: 1 % (ref 0–5)
HEMATOCRIT: 44.9 % (ref 39.0–52.0)
Hemoglobin: 14.4 g/dL (ref 13.0–17.0)
Lymphocytes Relative: 12 % (ref 12–46)
Lymphs Abs: 0.9 10*3/uL (ref 0.7–4.0)
MCH: 28.2 pg (ref 26.0–34.0)
MCHC: 32.1 g/dL (ref 30.0–36.0)
MCV: 88 fL (ref 78.0–100.0)
MONO ABS: 1.8 10*3/uL — AB (ref 0.1–1.0)
Monocytes Relative: 24 % — ABNORMAL HIGH (ref 3–12)
Neutro Abs: 4.7 10*3/uL (ref 1.7–7.7)
Neutrophils Relative %: 63 % (ref 43–77)
Platelets: 140 10*3/uL — ABNORMAL LOW (ref 150–400)
RBC: 5.1 MIL/uL (ref 4.22–5.81)
RDW: 15.3 % (ref 11.5–15.5)
WBC: 7.4 10*3/uL (ref 4.0–10.5)

## 2013-07-17 NOTE — Telephone Encounter (Signed)
Message copied by Hulan Saas on Tue Jul 17, 2013  8:22 AM ------      Message from: Hulan Saas      Created: Thu Jul 05, 2013 11:05 AM       Call and remind due for LFT for JZ on 07/19/13. Lab in EPIC ------

## 2013-07-17 NOTE — Telephone Encounter (Signed)
Spoke with patient and he will come for lab. 

## 2013-07-18 ENCOUNTER — Telehealth: Payer: Self-pay | Admitting: *Deleted

## 2013-07-18 NOTE — Telephone Encounter (Signed)
Message copied by [name] on Wed Jul 18, 2013  4:22 PM ------      Message from: [name]      Created: Tue Jul 17, 2013  1:41 PM       Call pt: significant improvement in platelet count up from 38,000 to 140,000! We do not need to start any Rx ------

## 2013-07-18 NOTE — Telephone Encounter (Signed)
Called pt - pt informed platelet count increased from 38,000 to 140,000 and no need to start any treatment per Dr Beryle Beams.

## 2013-07-19 ENCOUNTER — Other Ambulatory Visit: Payer: Medicare Other

## 2013-07-19 DIAGNOSIS — D510 Vitamin B12 deficiency anemia due to intrinsic factor deficiency: Secondary | ICD-10-CM

## 2013-07-19 DIAGNOSIS — D696 Thrombocytopenia, unspecified: Secondary | ICD-10-CM

## 2013-07-19 LAB — CBC WITH DIFFERENTIAL/PLATELET
BASOS ABS: 0 10*3/uL (ref 0.0–0.1)
BASOS PCT: 0 % (ref 0–1)
Eosinophils Absolute: 0.1 10*3/uL (ref 0.0–0.7)
Eosinophils Relative: 1 % (ref 0–5)
HEMATOCRIT: 40.4 % (ref 39.0–52.0)
Hemoglobin: 14.1 g/dL (ref 13.0–17.0)
Lymphocytes Relative: 12 % (ref 12–46)
Lymphs Abs: 0.9 10*3/uL (ref 0.7–4.0)
MCH: 28.3 pg (ref 26.0–34.0)
MCHC: 34.9 g/dL (ref 30.0–36.0)
MCV: 81.1 fL (ref 78.0–100.0)
Monocytes Absolute: 2.1 10*3/uL — ABNORMAL HIGH (ref 0.1–1.0)
Monocytes Relative: 30 % — ABNORMAL HIGH (ref 3–12)
NEUTROS ABS: 4 10*3/uL (ref 1.7–7.7)
Neutrophils Relative %: 57 % (ref 43–77)
Platelets: 221 10*3/uL (ref 150–400)
RBC: 4.98 MIL/uL (ref 4.22–5.81)
RDW: 15.5 % (ref 11.5–15.5)
WBC: 7.1 10*3/uL (ref 4.0–10.5)

## 2013-07-20 LAB — COMPREHENSIVE METABOLIC PANEL
ALK PHOS: 481 U/L — AB (ref 39–117)
ALT: 85 U/L — ABNORMAL HIGH (ref 0–53)
AST: 45 U/L — ABNORMAL HIGH (ref 0–37)
Albumin: 3.8 g/dL (ref 3.5–5.2)
BILIRUBIN TOTAL: 1.8 mg/dL — AB (ref 0.2–1.2)
BUN: 9 mg/dL (ref 6–23)
CO2: 29 mEq/L (ref 19–32)
Calcium: 9.1 mg/dL (ref 8.4–10.5)
Chloride: 104 mEq/L (ref 96–112)
Creat: 0.75 mg/dL (ref 0.50–1.35)
GLUCOSE: 101 mg/dL — AB (ref 70–99)
POTASSIUM: 4.7 meq/L (ref 3.5–5.3)
Sodium: 143 mEq/L (ref 135–145)
Total Protein: 5.5 g/dL — ABNORMAL LOW (ref 6.0–8.3)

## 2013-07-23 ENCOUNTER — Other Ambulatory Visit: Payer: Medicare Other

## 2013-07-24 ENCOUNTER — Other Ambulatory Visit (INDEPENDENT_AMBULATORY_CARE_PROVIDER_SITE_OTHER): Payer: Medicare Other

## 2013-07-24 ENCOUNTER — Other Ambulatory Visit: Payer: Self-pay | Admitting: Oncology

## 2013-07-24 DIAGNOSIS — R945 Abnormal results of liver function studies: Secondary | ICD-10-CM

## 2013-07-24 DIAGNOSIS — D510 Vitamin B12 deficiency anemia due to intrinsic factor deficiency: Secondary | ICD-10-CM

## 2013-07-24 DIAGNOSIS — R7989 Other specified abnormal findings of blood chemistry: Secondary | ICD-10-CM

## 2013-07-24 DIAGNOSIS — D696 Thrombocytopenia, unspecified: Secondary | ICD-10-CM

## 2013-07-24 LAB — CBC WITH DIFFERENTIAL/PLATELET
BASOS PCT: 0 % (ref 0–1)
Basophils Absolute: 0 10*3/uL (ref 0.0–0.1)
EOS ABS: 0.1 10*3/uL (ref 0.0–0.7)
Eosinophils Relative: 1 % (ref 0–5)
HCT: 43.9 % (ref 39.0–52.0)
HEMOGLOBIN: 14.4 g/dL (ref 13.0–17.0)
LYMPHS ABS: 0.8 10*3/uL (ref 0.7–4.0)
Lymphocytes Relative: 14 % (ref 12–46)
MCH: 28.1 pg (ref 26.0–34.0)
MCHC: 32.8 g/dL (ref 30.0–36.0)
MCV: 85.7 fL (ref 78.0–100.0)
Monocytes Absolute: 1.9 10*3/uL — ABNORMAL HIGH (ref 0.1–1.0)
Monocytes Relative: 34 % — ABNORMAL HIGH (ref 3–12)
NEUTROS PCT: 51 % (ref 43–77)
Neutro Abs: 2.9 10*3/uL (ref 1.7–7.7)
Platelets: 348 10*3/uL (ref 150–400)
RBC: 5.12 MIL/uL (ref 4.22–5.81)
RDW: 14.4 % (ref 11.5–15.5)
WBC: 5.7 10*3/uL (ref 4.0–10.5)

## 2013-07-25 ENCOUNTER — Telehealth: Payer: Self-pay | Admitting: *Deleted

## 2013-07-25 NOTE — Telephone Encounter (Signed)
Pt called/Informed his plt count remains normal and can stop weekly labs per Dr Beryle Beams. And he will be notified when next labs needed. Voiced understanding.

## 2013-07-25 NOTE — Telephone Encounter (Signed)
Message copied by Ebbie Latus on Wed Jul 25, 2013  1:22 PM ------      Message from: [name]      Created: Tue Jul 24, 2013  1:32 PM       Call  pt: platelet count remains normal - we can stop the weekly lab checks ------

## 2013-07-31 ENCOUNTER — Encounter: Payer: Self-pay | Admitting: Internal Medicine

## 2013-07-31 ENCOUNTER — Ambulatory Visit (INDEPENDENT_AMBULATORY_CARE_PROVIDER_SITE_OTHER): Payer: Medicare Other | Admitting: Internal Medicine

## 2013-07-31 ENCOUNTER — Other Ambulatory Visit: Payer: Medicare Other

## 2013-07-31 VITALS — BP 142/78 | HR 84 | Ht 62.0 in | Wt 147.0 lb

## 2013-07-31 DIAGNOSIS — K831 Obstruction of bile duct: Secondary | ICD-10-CM

## 2013-07-31 DIAGNOSIS — R7401 Elevation of levels of liver transaminase levels: Secondary | ICD-10-CM

## 2013-07-31 DIAGNOSIS — R740 Nonspecific elevation of levels of transaminase and lactic acid dehydrogenase [LDH]: Secondary | ICD-10-CM

## 2013-07-31 MED ORDER — LORAZEPAM 0.5 MG PO TABS: ORAL_TABLET | ORAL | Status: DC

## 2013-07-31 NOTE — Patient Instructions (Addendum)
Please have your hepatic function blood test done in 2 weeks. I will call you with more information.  We have sent the following medications to your pharmacy for you to pick up at your convenience: Ativan  CC:Dr Deborra Medina

## 2013-07-31 NOTE — Progress Notes (Signed)
[name] 07-Oct-1933 [id number]  Note: This dictation was prepared with Dragon digital system. Any transcriptional errors that result from this procedure are unintentional.   History of Present Illness: This is a 80 year old white male was recently hospitalized with choledocholithiasis and underwent ERCP and extension of prior sphincterotomy by Dr. Carlean Purl on 07/04/2013.with removal of stones. He has been under a lot of stress since his wife, who was outpatient, passed away with pancreatic cancer at the end of May of this year. He had a prior cholecystectomy and choledocholithiasis in August 2012 and  underwent ERCP by Dr. Carlean Purl. He is currently doing well. His bilirubin has decreased from 4.5-1.8 but alkaline phosphatase still stays up at 481 with AST of 45 and ALT of 85. He denies abdominal pain dyspepsia but admits to decreased appetite depression and difficulty sleeping. He has lost about 10 pounds in the process of taking care of his wife. There is a family history of colon cancer in a direct relatives. His colonoscopy in 2003 and subsequently in December 2008 showed adenomatous polyp. She was diagnosed in the past with pernicious anemia and B12 deficiency and has been on B12 supplements.    Past Medical History  Diagnosis Date  . Coronary atherosclerosis of unspecified type of vessel, native or graft   . Personal history of colonic polyps     adenomatous  . Hypertrophy of prostate without urinary obstruction and other lower urinary tract symptoms (LUTS)   . Hypertension   . Hyperlipidemia   . Diverticulosis of colon (without mention of hemorrhage)   . Degeneration of intervertebral disc, site unspecified   . Cluster headache   . Pernicious anemia   . Neurogenic dysphagia   . Osteoarthritis   . CHF (congestive heart failure)   . Unspecified sleep apnea     does not have cpap-was not able to tolerate  . Prostate enlargement     urinary frequency, nocturia  .  Thrombocytopenia, unspecified 09/01/2012  . Choledocholithiasis with obstruction 07/02/2013  . Abnormal LFTs   . Nephrolithiasis   . B12 deficiency     Past Surgical History  Procedure Laterality Date  . Inguinal herniorrhapy  2003  . Uvulectomy  1993  . Knee arthroscopy      5 times  . Total knee arthroplasty  2003  . Rotator cuff repair Right 2006  . Biopsy prostate  2008    Benign  . Tonsillectomy    . Total hip arthroplasty Right 2011  . Hernia repair  2004    RIH  . Bone marrow biopsy  2011  . Nasal endoscopy  2012  . Cholecystectomy  11/24/2010    Procedure: LAPAROSCOPIC CHOLECYSTECTOMY WITH INTRAOPERATIVE CHOLANGIOGRAM;  Surgeon: Earnstine Regal, MD;  Location: [facility];  Service: General;  Laterality: N/A;  c-arm  . Ercp N/A 07/04/2013    Procedure: ENDOSCOPIC RETROGRADE CHOLANGIOPANCREATOGRAPHY (ERCP);  Surgeon: Gatha Mayer, MD;  Location: [facility];  Service: Endoscopy;  Laterality: N/A;  MAC if available    Allergies  Allergen Reactions  . Penicillins Other (See Comments)    Whelps, passed out  . Sulfamethoxazole-Trimethoprim Rash  . Sulfonamide Derivatives Rash    Family history and social history have been reviewed.  Review of Systems:   The remainder of the 10 point ROS is negative except as outlined in the H&P  Physical Exam: General Appearance Well developed, in no distress Eyes  Non icteric  HEENT  Non traumatic, normocephalic  Mouth No lesion, tongue papillated, no  cheilosis Neck Supple without adenopathy, thyroid not enlarged, no carotid bruits, no JVD Lungs Clear to auscultation bilaterally COR Normal S1, normal S2, regular rhythm, no murmur, quiet precordium Abdomen Soft nontender abdomen with normal active bowel sounds. Liver edge at costal margin. No distention  Rectal Not done  Extremities  No pedal edema Skin No lesions Neurological Alert and oriented x 3 Psychological Normal mood and affect  Assessment and Plan:   80 year old white  male who had recurrent choledocholithiasis and underwent recent ERCP with the extension of sphincterotomy. He is doing well but his liver function tests are not back to normal. One possibility is that there may be more retained stones or that he has ongoing low-grade hepatitis from recent.obstruction. We will repeat the liver function tests in 2 weeks in Dr Hulen Shouts office. and consider repeating upper abdominal ultrasound to look for retained stones. If abnormalities persist. Also consider using URSO for stone and sludge prevention  Depression. Caused by death of his wife 6 weeks ago. He needs something for sleep. We will start Ativan 0.5 mg at bedtime. He will discuss alternative medications with Dr. Deborra Medina.  Colorectal screening. She is up-to-date on colonoscopy. Last exam May 2011. Next exam 2016 may not needed because of his age    [name] 07/31/2013

## 2013-08-01 NOTE — Addendum Note (Signed)
Addended by: Larina Bras on: 08/01/2013 09:14 AM   Modules accepted: Orders

## 2013-08-03 DIAGNOSIS — N200 Calculus of kidney: Secondary | ICD-10-CM

## 2013-08-03 DIAGNOSIS — N390 Urinary tract infection, site not specified: Secondary | ICD-10-CM

## 2013-08-03 DIAGNOSIS — F4321 Adjustment disorder with depressed mood: Secondary | ICD-10-CM

## 2013-08-03 DIAGNOSIS — I10 Essential (primary) hypertension: Secondary | ICD-10-CM

## 2013-08-03 DIAGNOSIS — R3915 Urgency of urination: Secondary | ICD-10-CM

## 2013-08-03 DIAGNOSIS — R7989 Other specified abnormal findings of blood chemistry: Secondary | ICD-10-CM

## 2013-08-07 ENCOUNTER — Other Ambulatory Visit: Payer: Medicare Other

## 2013-08-14 ENCOUNTER — Other Ambulatory Visit (INDEPENDENT_AMBULATORY_CARE_PROVIDER_SITE_OTHER): Payer: Medicare Other

## 2013-08-14 ENCOUNTER — Other Ambulatory Visit: Payer: Medicare Other

## 2013-08-14 DIAGNOSIS — R740 Nonspecific elevation of levels of transaminase and lactic acid dehydrogenase [LDH]: Secondary | ICD-10-CM

## 2013-08-14 DIAGNOSIS — R7401 Elevation of levels of liver transaminase levels: Secondary | ICD-10-CM

## 2013-08-14 DIAGNOSIS — D510 Vitamin B12 deficiency anemia due to intrinsic factor deficiency: Secondary | ICD-10-CM

## 2013-08-14 DIAGNOSIS — R945 Abnormal results of liver function studies: Secondary | ICD-10-CM

## 2013-08-14 DIAGNOSIS — R7402 Elevation of levels of lactic acid dehydrogenase (LDH): Secondary | ICD-10-CM

## 2013-08-14 DIAGNOSIS — R7989 Other specified abnormal findings of blood chemistry: Secondary | ICD-10-CM

## 2013-08-14 DIAGNOSIS — K831 Obstruction of bile duct: Secondary | ICD-10-CM

## 2013-08-14 DIAGNOSIS — D696 Thrombocytopenia, unspecified: Secondary | ICD-10-CM

## 2013-08-14 LAB — HEPATIC FUNCTION PANEL
ALT: 23 U/L (ref 0–53)
AST: 27 U/L (ref 0–37)
Albumin: 3.3 g/dL — ABNORMAL LOW (ref 3.5–5.2)
Alkaline Phosphatase: 127 U/L — ABNORMAL HIGH (ref 39–117)
BILIRUBIN TOTAL: 0.9 mg/dL (ref 0.2–1.2)
Bilirubin, Direct: 0.3 mg/dL (ref 0.0–0.3)
Total Protein: 6.1 g/dL (ref 6.0–8.3)

## 2013-08-15 ENCOUNTER — Other Ambulatory Visit: Payer: Self-pay | Admitting: *Deleted

## 2013-08-15 DIAGNOSIS — R7989 Other specified abnormal findings of blood chemistry: Secondary | ICD-10-CM

## 2013-08-15 DIAGNOSIS — R945 Abnormal results of liver function studies: Secondary | ICD-10-CM

## 2013-08-21 DIAGNOSIS — M79609 Pain in unspecified limb: Secondary | ICD-10-CM

## 2013-09-04 ENCOUNTER — Other Ambulatory Visit (INDEPENDENT_AMBULATORY_CARE_PROVIDER_SITE_OTHER): Payer: Medicare Other

## 2013-09-04 ENCOUNTER — Encounter (INDEPENDENT_AMBULATORY_CARE_PROVIDER_SITE_OTHER): Payer: Self-pay

## 2013-09-04 DIAGNOSIS — R7989 Other specified abnormal findings of blood chemistry: Secondary | ICD-10-CM

## 2013-09-04 DIAGNOSIS — R945 Abnormal results of liver function studies: Secondary | ICD-10-CM

## 2013-09-04 DIAGNOSIS — D696 Thrombocytopenia, unspecified: Secondary | ICD-10-CM

## 2013-09-04 DIAGNOSIS — K831 Obstruction of bile duct: Secondary | ICD-10-CM

## 2013-09-04 DIAGNOSIS — D510 Vitamin B12 deficiency anemia due to intrinsic factor deficiency: Secondary | ICD-10-CM

## 2013-09-04 DIAGNOSIS — R740 Nonspecific elevation of levels of transaminase and lactic acid dehydrogenase [LDH]: Secondary | ICD-10-CM

## 2013-09-04 DIAGNOSIS — R7401 Elevation of levels of liver transaminase levels: Secondary | ICD-10-CM

## 2013-09-04 LAB — COMPREHENSIVE METABOLIC PANEL
ALK PHOS: 92 U/L (ref 39–117)
ALT: 15 U/L (ref 0–53)
AST: 21 U/L (ref 0–37)
Albumin: 3.6 g/dL (ref 3.5–5.2)
BILIRUBIN TOTAL: 1.1 mg/dL (ref 0.2–1.2)
BUN: 8 mg/dL (ref 6–23)
CO2: 28 mEq/L (ref 19–32)
Calcium: 9 mg/dL (ref 8.4–10.5)
Chloride: 105 mEq/L (ref 96–112)
Creatinine, Ser: 1 mg/dL (ref 0.4–1.5)
GFR: 81.51 mL/min (ref >60.00)
GLUCOSE: 85 mg/dL (ref 70–99)
Potassium: 4.2 mEq/L (ref 3.5–5.1)
SODIUM: 141 meq/L (ref 135–145)
TOTAL PROTEIN: 6.2 g/dL (ref 6.0–8.3)

## 2013-09-04 LAB — CBC WITH DIFFERENTIAL/PLATELET
BASOS PCT: 0.5 % (ref 0.0–3.0)
Basophils Absolute: 0 10*3/uL (ref 0.0–0.1)
Eosinophils Absolute: 0 10*3/uL (ref 0.0–0.7)
Eosinophils Relative: 0.5 % (ref 0.0–5.0)
HEMATOCRIT: 43.6 % (ref 39.0–52.0)
Hemoglobin: 14.4 g/dL (ref 13.0–17.0)
LYMPHS ABS: 0.8 10*3/uL (ref 0.7–4.0)
Lymphocytes Relative: 13.9 % (ref 12.0–46.0)
MCHC: 32.9 g/dL (ref 30.0–36.0)
MCV: 83.9 fl (ref 78.0–100.0)
MONO ABS: 1.4 10*3/uL — AB (ref 0.1–1.0)
Monocytes Relative: 26.7 % — ABNORMAL HIGH (ref 3.0–12.0)
Neutro Abs: 3.2 10*3/uL (ref 1.4–7.7)
Neutrophils Relative %: 58.4 % (ref 43.0–77.0)
Platelets: 232 10*3/uL (ref 150.0–400.0)
RBC: 5.2 Mil/uL (ref 4.22–5.81)
RDW: 15.9 % — ABNORMAL HIGH (ref 11.5–15.5)
WBC: 5.4 10*3/uL (ref 4.0–10.5)

## 2013-09-06 ENCOUNTER — Ambulatory Visit (INDEPENDENT_AMBULATORY_CARE_PROVIDER_SITE_OTHER): Payer: Medicare Other | Admitting: Family Medicine

## 2013-09-06 ENCOUNTER — Encounter: Payer: Self-pay | Admitting: Family Medicine

## 2013-09-06 VITALS — BP 128/78 | HR 73 | Temp 98.1°F | Ht 61.0 in | Wt 147.5 lb

## 2013-09-06 DIAGNOSIS — Z23 Encounter for immunization: Secondary | ICD-10-CM

## 2013-09-06 DIAGNOSIS — F4321 Adjustment disorder with depressed mood: Secondary | ICD-10-CM

## 2013-09-06 DIAGNOSIS — R079 Chest pain, unspecified: Secondary | ICD-10-CM

## 2013-09-06 DIAGNOSIS — E538 Deficiency of other specified B group vitamins: Secondary | ICD-10-CM

## 2013-09-06 DIAGNOSIS — E785 Hyperlipidemia, unspecified: Secondary | ICD-10-CM

## 2013-09-06 DIAGNOSIS — R109 Unspecified abdominal pain: Secondary | ICD-10-CM

## 2013-09-06 DIAGNOSIS — I10 Essential (primary) hypertension: Secondary | ICD-10-CM

## 2013-09-06 DIAGNOSIS — Z0000 Encounter for general adult medical examination without abnormal findings: Secondary | ICD-10-CM | POA: Insufficient documentation

## 2013-09-06 DIAGNOSIS — D696 Thrombocytopenia, unspecified: Secondary | ICD-10-CM

## 2013-09-06 DIAGNOSIS — D510 Vitamin B12 deficiency anemia due to intrinsic factor deficiency: Secondary | ICD-10-CM

## 2013-09-06 LAB — LIPID PANEL
CHOL/HDL RATIO: 4
Cholesterol: 156 mg/dL (ref 0–200)
HDL: 41.7 mg/dL (ref >39.00)
LDL CALC: 85 mg/dL (ref 0–99)
NONHDL: 114.3
Triglycerides: 149 mg/dL (ref 0.0–149.0)
VLDL: 29.8 mg/dL (ref 0.0–40.0)

## 2013-09-06 LAB — LIPASE: LIPASE: 29 U/L (ref 11.0–59.0)

## 2013-09-06 MED ORDER — VALACYCLOVIR HCL 1 G PO TABS: 1000.0000 mg | ORAL_TABLET | Freq: Three times a day (TID) | ORAL | Status: AC

## 2013-09-06 MED ORDER — CYANOCOBALAMIN 1000 MCG/ML IJ SOLN
1000.0000 ug | Freq: Once | INTRAMUSCULAR | Status: AC
  Administered 2013-09-06: 1000 ug via INTRAMUSCULAR

## 2013-09-06 NOTE — Assessment & Plan Note (Signed)
Discussed options with [name]. I suggested starting Remeron for his appetite, insomnia and mood. He would like to defer this.  Has good support system and has seen hospice grief counselor.

## 2013-09-06 NOTE — Progress Notes (Signed)
Pre visit review using our clinic review tool, if applicable. No additional management support is needed unless otherwise documented below in the visit note. 

## 2013-09-06 NOTE — Addendum Note (Signed)
Addended by: Modena Nunnery on: 09/06/2013 10:05 AM   Modules accepted: Orders

## 2013-09-06 NOTE — Assessment & Plan Note (Signed)
The patients weight, height, BMI and visual acuity have been recorded in the chart I have made referrals, counseling and provided education to the patient based review of the above and I have provided the pt with a written personalized care plan for preventive services.  Flu shot Prevnar 13

## 2013-09-06 NOTE — Assessment & Plan Note (Addendum)
New- ?early stages Zoster.  Did receive zostavax in 2009 so hopefully this will be a mild version. Will treat with Valtrex 1 gram three times daily x 7 days.  Declined pain medication at this time. EKG reassuring- unchanged from prior and had neg myoview in 12/015 so unlikely cardiac. Will check lipase today. Call or return to clinic prn if these symptoms worsen or fail to improve as anticipated.  Continue to monitor. Call or return to clinic prn if these symptoms worsen or fail to improve as anticipated.

## 2013-09-06 NOTE — Assessment & Plan Note (Signed)
Well controlled. No changes made today. 

## 2013-09-06 NOTE — Progress Notes (Addendum)
Subjective:   Patient ID: [name], male    DOB: July 16, 1933, 80 y.o.   MRN: [id number]  [name] is a pleasant 80 y.o. year old male who presents to clinic today with Annual Exam and Shoulder Pain  on 09/06/2013  HPI: I have personally reviewed the Medicare Annual Wellness questionnaire and have noted 1. The patient's medical and social history 2. Their use of alcohol, tobacco or illicit drugs 3. Their current medications and supplements 4. The patient's functional ability including ADL's, fall risks, home safety risks and hearing or visual             impairment. 5. Diet and physical activities 6. Evidence for depression or mood disorders  End of life wishes discussed and updated in Social History.  The roster of all physicians providing medical care to patient - is listed in the Snapshot section of the chart.  Colonoscopy Jul 03, 2009 (Dr. Olevia Perches)- 5 year recall Td 05/05/11 Zoster 03/13/07 Pneumovax 01/11/07  Unfortunately, his wife passed away in 07/03/2013.  Still sad and adjusting but feels he is doing better.  Has been going out with friends more and doing more with working on car restoration.  Going to more car shows- has two this weekend. Appetite fair- weight stable.  Not sleeping well at night- Dr. Olevia Perches gave him some lorazepam which has only taken a few times.  Wt Readings from Last 3 Encounters:  09/06/13 147 lb 8 oz (66.906 kg)  07/31/13 147 lb (66.679 kg)  07/10/13 146 lb 8 oz (66.452 kg)   Left side pain- past few nights, waking up left sided chest/abdominal pain.  Feels his ribs "are sore." Mainly points to his chest.  Non exertional, no DOE. Sees Dr. Stanford Breed but not scheduled to see him again until 12/2013.  Saw Dr. Gaynelle Arabian last month- DRE and PSA done.  Recurrent choledocholithiasis s/p ERCP with extension of sphincterotomy- followed up with Dr. Olevia Perches on 07/31/13- note reviewed. Suggested repeating Abd [place] if LFTs did not return to normal.  LFTs  now normal. Lab Results  Component Value Date   ALT 15 09/04/2013   AST 21 09/04/2013   ALKPHOS 92 09/04/2013   BILITOT 1.1 09/04/2013   Thrombocytopenia with h/o pernicious anemia- followed by Dr. Beryle Beams.  Was last seen on 07/13/13.  Note reviewed.  Advised continued observation. Platelets improved.  Lab Results  Component Value Date   WBC 5.4 09/04/2013   HGB 14.4 09/04/2013   HCT 43.6 09/04/2013   MCV 83.9 09/04/2013   PLT 232.0 09/04/2013     Current Outpatient Prescriptions on File Prior to Visit  Medication Sig Dispense Refill  . calcium carbonate (TUMS - DOSED IN MG ELEMENTAL CALCIUM) 500 MG chewable tablet Chew 2 tablets by mouth once.      . celecoxib (CELEBREX) 200 MG capsule Take 200 mg by mouth 2 (two) times daily as needed (for pain.).      Marland Kitchen clobetasol cream (TEMOVATE) 3.15 % Apply 1 application topically 2 (two) times daily. For whelps/itching      . co-enzyme Q-10 30 MG capsule Take 200 mg by mouth daily.      . cyanocobalamin (,VITAMIN B-12,) 1000 MCG/ML injection Inject 1 mL (1,000 mcg total) into the muscle every 30 (thirty) days.  1 mL  3  . doxazosin (CARDURA) 4 MG tablet Take 2 mg by mouth every evening.       . metoprolol succinate (TOPROL-XL) 25 MG 24 hr tablet Take 12.5  mg by mouth daily.      . nitroGLYCERIN (NITROSTAT) 0.4 MG SL tablet Place 1 tablet (0.4 mg total) under the tongue every 5 (five) minutes as needed. Up to 3 doses  25 tablet  11  . Omega-3 Fatty Acids (FISH OIL) 1200 MG CAPS Take 1,200 mg by mouth daily.      . Tamsulosin HCl (FLOMAX) 0.4 MG CAPS Take 0.4 mg by mouth daily after supper.       No current facility-administered medications on file prior to visit.    Allergies  Allergen Reactions  . Penicillins Other (See Comments)    Whelps, passed out  . Sulfamethoxazole-Trimethoprim Rash  . Sulfonamide Derivatives Rash    Past Medical History  Diagnosis Date  . Coronary atherosclerosis of unspecified type of vessel, native or graft     . Personal history of colonic polyps     adenomatous  . Hypertrophy of prostate without urinary obstruction and other lower urinary tract symptoms (LUTS)   . Hypertension   . Hyperlipidemia   . Diverticulosis of colon (without mention of hemorrhage)   . Degeneration of intervertebral disc, site unspecified   . Cluster headache   . Pernicious anemia   . Neurogenic dysphagia   . Osteoarthritis   . CHF (congestive heart failure)   . Unspecified sleep apnea     does not have cpap-was not able to tolerate  . Prostate enlargement     urinary frequency, nocturia  . Thrombocytopenia, unspecified 09/01/2012  . Choledocholithiasis with obstruction 07/02/2013  . Abnormal LFTs   . Nephrolithiasis   . B12 deficiency     Past Surgical History  Procedure Laterality Date  . Inguinal herniorrhapy  2003  . Uvulectomy  1993  . Knee arthroscopy      5 times  . Total knee arthroplasty  2003  . Rotator cuff repair Right 2006  . Biopsy prostate  2008    Benign  . Tonsillectomy    . Total hip arthroplasty Right 2011  . Hernia repair  2004    RIH  . Bone marrow biopsy  2011  . Nasal endoscopy  2012  . Cholecystectomy  11/24/2010    Procedure: LAPAROSCOPIC CHOLECYSTECTOMY WITH INTRAOPERATIVE CHOLANGIOGRAM;  Surgeon: Earnstine Regal, MD;  Location: [facility];  Service: General;  Laterality: N/A;  c-arm  . Ercp N/A 07/04/2013    Procedure: ENDOSCOPIC RETROGRADE CHOLANGIOPANCREATOGRAPHY (ERCP);  Surgeon: Gatha Mayer, MD;  Location: [facility];  Service: Endoscopy;  Laterality: N/A;  MAC if available    Family History  Problem Relation Age of Onset  . Colon cancer Father 55  . Peripheral vascular disease Mother     History   Social History  . Marital Status: Married    Spouse Name: N/A    Number of Children: 2  . Years of Education: N/A   Occupational History  . retired    Social History Main Topics  . Smoking status: Former Smoker    Types: Pipe    Quit date: 01/18/1966  .  Smokeless tobacco: Never Used  . Alcohol Use: No  . Drug Use: No  . Sexual Activity: Not on file   Other Topics Concern  . Not on file   Social History Narrative   Desires DNR.   The PMH, PSH, Social History, Family History, Medications, and allergies have been reviewed in [facility], and have been updated if relevant.    Review of Systems    See HPI Denies SI or  HI +insomnia Denies changes in his bowel habits or blood in his stool No nausea or vomiting No recurrent jaundice No blurred vision No DOE NO LE edema +itchy on left side  Objective:    BP 128/78  Pulse 73  Temp(Src) 98.1 F (36.7 C) (Oral)  Ht 5' 1"  (1.549 m)  Wt 147 lb 8 oz (66.906 kg)  BMI 27.88 kg/m2  SpO2 98%   Physical Exam  General:  Pleasant male in NAD Eyes:  PERRL Ears:  External ear exam shows no significant lesions or deformities.  Otoscopic examination reveals clear canals, tympanic membranes are intact bilaterally without bulging, retraction, inflammation or discharge. Hearing is grossly normal bilaterally. Nose:  External nasal examination shows no deformity or inflammation. Nasal mucosa are pink and moist without lesions or exudates. Mouth:  Oral mucosa and oropharynx without lesions or exudates.  Teeth in good repair. Neck:  no carotid bruit or thyromegaly no cervical or supraclavicular lymphadenopathy Chest:  NTTP over rib cage throughout  +vesicular rash with dermatomal distribution on left chest Lungs:  Normal respiratory effort, chest expands symmetrically. Lungs are clear to auscultation, no crackles or wheezes. Heart:  Normal rate and regular rhythm. S1 and S2 normal without gallop, murmur, click, rub or other extra sounds. Abdomen:  Bowel sounds positive,abdomen soft and non-tender without masses, organomegaly or hernias noted. Genitalia: deferred- just saw urologist Pulses:  R and L posterior tibial pulses are full and equal bilaterally  Extremities:  no edema        Assessment &  Plan:   Side pain  Medicare annual wellness visit, subsequent  Thrombocytopenia, unspecified  HYPERTENSION  HYPERLIPIDEMIA  ANEMIA, PERNICIOUS  Grief No Follow-up on file.

## 2013-09-06 NOTE — Addendum Note (Signed)
Addended by: Lucille Passy on: 09/06/2013 10:01 AM   Modules accepted: Orders

## 2013-09-06 NOTE — Patient Instructions (Addendum)
Good to see you. You received your B12, flu shot and Prevnar 13 pneumonia booster shots today. We will call you with your lab results. I think you likely have the shingles- take Valtrex - 1 tablet three times daily for 7 days.

## 2013-09-06 NOTE — Assessment & Plan Note (Signed)
Due for lipid panel. Will check today.

## 2013-09-06 NOTE — Assessment & Plan Note (Signed)
Followed by hematology. Improved.

## 2013-09-06 NOTE — Assessment & Plan Note (Addendum)
IM B12 today. Followed by hematology.

## 2013-09-20 ENCOUNTER — Telehealth: Payer: Self-pay | Admitting: *Deleted

## 2013-09-20 NOTE — Telephone Encounter (Signed)
Message copied by Hulan Saas on Thu Sep 20, 2013  9:48 AM ------      Message from: Hulan Saas      Created: Wed Aug 15, 2013  4:53 PM       Call and remind due for LFT DB on 09/24/13. Lab in EPIC ------

## 2013-09-20 NOTE — Telephone Encounter (Signed)
Left a message for patient to call back. 

## 2013-09-20 NOTE — Telephone Encounter (Signed)
Patient notified and he will come for labs. 

## 2013-09-25 ENCOUNTER — Other Ambulatory Visit (INDEPENDENT_AMBULATORY_CARE_PROVIDER_SITE_OTHER): Payer: Medicare Other

## 2013-09-25 DIAGNOSIS — R945 Abnormal results of liver function studies: Secondary | ICD-10-CM

## 2013-09-25 DIAGNOSIS — R7989 Other specified abnormal findings of blood chemistry: Secondary | ICD-10-CM

## 2013-09-25 LAB — HEPATIC FUNCTION PANEL
ALT: 15 U/L (ref 0–53)
AST: 19 U/L (ref 0–37)
Albumin: 3.8 g/dL (ref 3.5–5.2)
Alkaline Phosphatase: 89 U/L (ref 39–117)
BILIRUBIN TOTAL: 0.8 mg/dL (ref 0.2–1.2)
Bilirubin, Direct: 0.2 mg/dL (ref 0.0–0.3)
Total Protein: 6.2 g/dL (ref 6.0–8.3)

## 2013-10-24 DIAGNOSIS — H2513 Age-related nuclear cataract, bilateral: Secondary | ICD-10-CM | POA: Insufficient documentation

## 2013-11-12 ENCOUNTER — Telehealth: Payer: Self-pay

## 2013-11-12 NOTE — Telephone Encounter (Signed)
Pt request restarting B 12 injections;last B12  08/2013; scheduled pt 11/20/13 at 11AM.pt voiced understanding.

## 2013-11-20 ENCOUNTER — Ambulatory Visit (INDEPENDENT_AMBULATORY_CARE_PROVIDER_SITE_OTHER): Payer: Medicare Other

## 2013-11-20 DIAGNOSIS — E538 Deficiency of other specified B group vitamins: Secondary | ICD-10-CM

## 2013-11-20 MED ORDER — CYANOCOBALAMIN 1000 MCG/ML IJ SOLN
1000.0000 ug | Freq: Once | INTRAMUSCULAR | Status: AC
  Administered 2013-11-20: 1000 ug via INTRAMUSCULAR

## 2013-12-23 ENCOUNTER — Other Ambulatory Visit: Payer: Self-pay | Admitting: Oncology

## 2013-12-23 DIAGNOSIS — D510 Vitamin B12 deficiency anemia due to intrinsic factor deficiency: Secondary | ICD-10-CM

## 2013-12-25 ENCOUNTER — Other Ambulatory Visit (INDEPENDENT_AMBULATORY_CARE_PROVIDER_SITE_OTHER): Payer: Medicare Other

## 2013-12-25 DIAGNOSIS — D510 Vitamin B12 deficiency anemia due to intrinsic factor deficiency: Secondary | ICD-10-CM

## 2013-12-25 LAB — CBC WITH DIFFERENTIAL/PLATELET
BASOS ABS: 0 10*3/uL (ref 0.0–0.1)
Basophils Relative: 0 % (ref 0–1)
Eosinophils Absolute: 0 10*3/uL (ref 0.0–0.7)
Eosinophils Relative: 0 % (ref 0–5)
HEMATOCRIT: 44.2 % (ref 39.0–52.0)
HEMOGLOBIN: 15 g/dL (ref 13.0–17.0)
LYMPHS PCT: 15 % (ref 12–46)
Lymphs Abs: 0.9 10*3/uL (ref 0.7–4.0)
MCH: 28.2 pg (ref 26.0–34.0)
MCHC: 33.9 g/dL (ref 30.0–36.0)
MCV: 83.2 fL (ref 78.0–100.0)
MONOS PCT: 34 % — AB (ref 3–12)
MPV: 11.6 fL (ref 9.4–12.4)
Monocytes Absolute: 2 10*3/uL — ABNORMAL HIGH (ref 0.1–1.0)
Neutro Abs: 3 10*3/uL (ref 1.7–7.7)
Neutrophils Relative %: 51 % (ref 43–77)
PLATELETS: 181 10*3/uL (ref 150–400)
RBC: 5.31 MIL/uL (ref 4.22–5.81)
RDW: 14.9 % (ref 11.5–15.5)
WBC: 5.9 10*3/uL (ref 4.0–10.5)

## 2013-12-27 ENCOUNTER — Telehealth: Payer: Self-pay | Admitting: *Deleted

## 2013-12-27 ENCOUNTER — Ambulatory Visit (INDEPENDENT_AMBULATORY_CARE_PROVIDER_SITE_OTHER): Payer: Medicare Other | Admitting: *Deleted

## 2013-12-27 DIAGNOSIS — D519 Vitamin B12 deficiency anemia, unspecified: Secondary | ICD-10-CM

## 2013-12-27 MED ORDER — CYANOCOBALAMIN 1000 MCG/ML IJ SOLN
1000.0000 ug | Freq: Once | INTRAMUSCULAR | Status: AC
  Administered 2013-12-27: 1000 ug via INTRAMUSCULAR

## 2013-12-27 NOTE — Telephone Encounter (Signed)
Pt came in for b12 shot, and brought his handicap renewal form. He said it was initially done by his ortho in [place], but he wondered if you could do it so he won't have to drive to [place]. If you approve it, pt would like it mailed to him. If not I informed pt we would call him. Form placed in your inbox.

## 2013-12-31 NOTE — Telephone Encounter (Signed)
Lm on pts vm informing him form is available for pickup from the front desk

## 2013-12-31 NOTE — Progress Notes (Signed)
HPI: FU coronary artery disease by cardiac catheterization in 2000. At that time he had a 60-70% proximal LAD, 70-80% stenosis in a small intermediate and a 20% stenosis in the mid right coronary artery. His LV function was normal. His most recent Myoview was performed in Jan 2015. At that time, his ejection fraction was 82% and there was no ischemia or infarction. Abdominal CT June 2015 showed no aneurysm.Since last seen, he denies any dyspnea on exertion, orthopnea, PND, pedal edema, palpitations or syncope. Approximately 2 weeks ago he had brief epigastric pain radiating to his jaw. It lasted 10 minutes and resolved spontaneously. He has not had exertional chest pain. He lost his wife approximately one year ago to pancreatic cancer. He is appropriately upset.  Current Outpatient Prescriptions  Medication Sig Dispense Refill  . calcium carbonate (TUMS - DOSED IN MG ELEMENTAL CALCIUM) 500 MG chewable tablet Chew 2 tablets by mouth once.    . celecoxib (CELEBREX) 200 MG capsule Take 200 mg by mouth 2 (two) times daily as needed (for pain.).    Marland Kitchen clobetasol cream (TEMOVATE) 3.00 % Apply 1 application topically 2 (two) times daily. For whelps/itching    . co-enzyme Q-10 30 MG capsule Take 200 mg by mouth daily.    . cyanocobalamin (,VITAMIN B-12,) 1000 MCG/ML injection Inject 1 mL (1,000 mcg total) into the muscle every 30 (thirty) days. 1 mL 3  . metoprolol succinate (TOPROL-XL) 25 MG 24 hr tablet Take 12.5 mg by mouth daily.    . nitroGLYCERIN (NITROSTAT) 0.4 MG SL tablet Place 1 tablet (0.4 mg total) under the tongue every 5 (five) minutes as needed. Up to 3 doses 25 tablet 11  . Omega-3 Fatty Acids (FISH OIL) 1200 MG CAPS Take 1,200 mg by mouth daily.    . Tamsulosin HCl (FLOMAX) 0.4 MG CAPS Take 0.4 mg by mouth daily after supper.     No current facility-administered medications for this visit.     Past Medical History  Diagnosis Date  . Coronary atherosclerosis of unspecified  type of vessel, native or graft   . Personal history of colonic polyps     adenomatous  . Hypertrophy of prostate without urinary obstruction and other lower urinary tract symptoms (LUTS)   . Hypertension   . Hyperlipidemia   . Diverticulosis of colon (without mention of hemorrhage)   . Degeneration of intervertebral disc, site unspecified   . Cluster headache   . Pernicious anemia   . Neurogenic dysphagia   . Osteoarthritis   . CHF (congestive heart failure)   . Unspecified sleep apnea     does not have cpap-was not able to tolerate  . Prostate enlargement     urinary frequency, nocturia  . Thrombocytopenia, unspecified 09/01/2012  . Choledocholithiasis with obstruction 07/02/2013  . Abnormal LFTs   . Nephrolithiasis   . B12 deficiency   . Monocytosis 01/01/2014    Past Surgical History  Procedure Laterality Date  . Inguinal herniorrhapy  2003  . Uvulectomy  1993  . Knee arthroscopy      5 times  . Total knee arthroplasty  2003  . Rotator cuff repair Right 2006  . Biopsy prostate  2008    Benign  . Tonsillectomy    . Total hip arthroplasty Right 2011  . Hernia repair  2004    RIH  . Bone marrow biopsy  2011  . Nasal endoscopy  2012  . Cholecystectomy  11/24/2010    Procedure:  LAPAROSCOPIC CHOLECYSTECTOMY WITH INTRAOPERATIVE CHOLANGIOGRAM;  Surgeon: Earnstine Regal, MD;  Location: [facility];  Service: General;  Laterality: N/A;  c-arm  . Ercp N/A 07/04/2013    Procedure: ENDOSCOPIC RETROGRADE CHOLANGIOPANCREATOGRAPHY (ERCP);  Surgeon: Gatha Mayer, MD;  Location: [facility];  Service: Endoscopy;  Laterality: N/A;  MAC if available    History   Social History  . Marital Status: Married    Spouse Name: N/A    Number of Children: 2  . Years of Education: N/A   Occupational History  . retired    Social History Main Topics  . Smoking status: Former Smoker    Types: Pipe    Quit date: 01/18/1966  . Smokeless tobacco: Never Used  . Alcohol Use: No  . Drug Use: No    . Sexual Activity: Not on file   Other Topics Concern  . Not on file   Social History Narrative   Desires DNR.    ROS: arthralgias but no fevers or chills, productive cough, hemoptysis, dysphasia, odynophagia, melena, hematochezia, dysuria, hematuria, rash, seizure activity, orthopnea, PND, pedal edema, claudication. Remaining systems are negative.  Physical Exam: Well-developed well-nourished in no acute distress.  Skin is warm and dry.  HEENT is normal.  Neck is supple.  Chest is clear to auscultation with normal expansion.  Cardiovascular exam is regular rate and rhythm.  Abdominal exam nontender or distended. No masses palpated. Extremities show no edema. neuro grossly intact  ECG Sinus rhythm, right axis deviation.

## 2014-01-01 ENCOUNTER — Ambulatory Visit (INDEPENDENT_AMBULATORY_CARE_PROVIDER_SITE_OTHER): Payer: Medicare Other | Admitting: Oncology

## 2014-01-01 ENCOUNTER — Encounter: Payer: Self-pay | Admitting: Oncology

## 2014-01-01 VITALS — BP 132/55 | HR 78 | Temp 97.9°F | Ht 61.0 in | Wt 151.4 lb

## 2014-01-01 DIAGNOSIS — D510 Vitamin B12 deficiency anemia due to intrinsic factor deficiency: Secondary | ICD-10-CM

## 2014-01-01 DIAGNOSIS — D696 Thrombocytopenia, unspecified: Secondary | ICD-10-CM

## 2014-01-01 DIAGNOSIS — D72821 Monocytosis (symptomatic): Secondary | ICD-10-CM | POA: Insufficient documentation

## 2014-01-01 NOTE — Patient Instructions (Signed)
Return 1 year Lab 1 week before visit

## 2014-01-01 NOTE — Progress Notes (Signed)
Patient ID: [name], male   DOB: 02-13-1933, 80 y.o.   MRN: [id number] Hematology and Oncology Follow Up Visit  [name] [id number] 1933-10-15 80 y.o. 01/01/2014 7:24 PM   Principle Diagnosis: Encounter Diagnoses  Name Primary?  . ANEMIA, PERNICIOUS Yes  . Thrombocytopenia   . Monocytosis      Interim History:  Followup visit for this pleasant 80 year old man initially referred for evaluation of pancytopenia with macrocytic anemia in December 2010. He was diagnosed with pernicious anemia and started on B12 replacement. He had dramatic improvement in his hemoglobin but white count and platelet count remained borderline decreased. He had a peripheral blood monocytosis. Bone marrow aspiration and biopsy done 01/30/2009 to exclude a myelodysplastic syndrome. Marrow hypercellular for age but otherwise normal. No increased blasts. No ring sideroblasts. Normal cytogenetics. Over time white count normalized but there is a persistent monocytosis. Hemoglobin remains normal on B12 replacement. He has had a fluctuating platelet count. On some occasions the low platelet count was clearly spurious due to platelet clumping in vitro. He had a recent episode of what was likely thrombocytopenia secondary to platelet consumption when he developed biliary obstruction related to retained common duct bile stones requiring ERCP back in June. Platelets fell as low as 42,000. With treatment of biliary infection,  platelet count completely recovered and in fact, rebounded up to 348,000 by July 7. Count done last week on 12/25/2013 in anticipation of today's visit remains normal at 181,000. Hemoglobin 15. White count 5900 with 51% neutrophils, 15 lymphocytes, 34% monocytes.  He has had no other interim medical problems. He did see a dermatologist for a atypical lesion on the skin overlying his PIP joint right thumb. Steroid cream was prescribed but did not help. He continues to undergo grief counseling  through hospice and has found this very helpful in dealing with the death of his wife from pancreatic cancer earlier this year.   Medications: reviewed  Allergies:  Allergies  Allergen Reactions  . Penicillins Other (See Comments)    Whelps, passed out  . Sulfamethoxazole-Trimethoprim Rash  . Sulfonamide Derivatives Rash    Review of Systems: See history of present illness Remaining ROS negative:   Physical Exam: Blood pressure 132/55, pulse 78, temperature 97.9 F (36.6 C), temperature source Oral, height _0  (1.549 m), weight 151 lb 6.4 oz (68.675 kg), SpO2 99 %. Wt Readings from Last 3 Encounters:  01/01/14 151 lb 6.4 oz (68.675 kg)  09/06/13 147 lb 8 oz (66.906 kg)  07/31/13 147 lb (66.679 kg)     General appearance: Well-nourished Caucasian man HENNT: Pharynx no erythema, exudate, mass, or ulcer. No thyromegaly or thyroid nodules Lymph nodes: No cervical, supraclavicular, or axillary lymphadenopathy Breasts:  Lungs: Clear to auscultation, resonant to percussion throughout Heart: Regular rhythm, no murmur, no gallop, no rub, no click, no edema Abdomen: Soft, nontender, normal bowel sounds, no mass, no organomegaly Extremities: No edema, no calf tenderness Musculoskeletal: no joint deformities GU:  Vascular: Carotid pulses 2+, no bruits,  Neurologic: Alert, oriented, PERRLA,, cranial nerves grossly normal, motor strength 5 over 5, reflexes 1+ symmetric, upper body coordination normal, gait normal, Skin: No rash or ecchymosis. Scaly, indurated, 0.5 cm lesion on the skin overlying his right thumb PIP joint.  Lab Results: CBC W/Diff    Component Value Date/Time   WBC 5.9 12/25/2013 1019   WBC 6.3 01/16/2013 1225   RBC 5.31 12/25/2013 1019   RBC 5.17 01/16/2013 1225   RBC 4.21* 02/26/2009 1014  HGB 15.0 12/25/2013 1019   HGB 14.3 01/16/2013 1225   HCT 44.2 12/25/2013 1019   HCT 43.8 01/16/2013 1225   PLT 181 12/25/2013 1019   PLT 138* 01/16/2013 1225   MCV  83.2 12/25/2013 1019   MCV 84.7 01/16/2013 1225   MCH 28.2 12/25/2013 1019   MCH 27.7 01/16/2013 1225   MCHC 33.9 12/25/2013 1019   MCHC 32.6 01/16/2013 1225   RDW 14.9 12/25/2013 1019   RDW 14.5 01/16/2013 1225   LYMPHSABS 0.9 12/25/2013 1019   LYMPHSABS 0.7* 01/16/2013 1225   MONOABS 2.0* 12/25/2013 1019   MONOABS 1.9* 01/16/2013 1225   EOSABS 0.0 12/25/2013 1019   EOSABS 0.0 01/16/2013 1225   BASOSABS 0.0 12/25/2013 1019   BASOSABS 0.0 01/16/2013 1225     Chemistry      Component Value Date/Time   NA 141 09/04/2013 1008   K 4.2 09/04/2013 1008   CL 105 09/04/2013 1008   CO2 28 09/04/2013 1008   BUN 8 09/04/2013 1008   CREATININE 1.0 09/04/2013 1008   CREATININE 0.75 07/19/2013 1024      Component Value Date/Time   CALCIUM 9.0 09/04/2013 1008   ALKPHOS 89 09/25/2013 1019   AST 19 09/25/2013 1019   ALT 15 09/25/2013 1019   BILITOT 0.8 09/25/2013 1019       Radiological Studies: No results found.  Impression:  #1. Pernicious anemia. Ongoing complete response to B12 replacement. Continue monthly B12 injections.  #2. Persistent, unexplained monocytosis with normal bone marrow biopsy.  #3.  thrombocytopenia-transient, resolved Note previous occasions of spurious thrombocytopenia secondary to in vitro platelet clumping  #4. Skin lesions-ongoing follow-up with dermatology  #5. History of elevated PSA followed by urology Previous biopsies negative for cancer 2.  #6. Coronary artery disease  Stress Myoview study 01/29/2013 was normal. No areas of ischemia. Estimated left ventricular ejection fraction 82% with normal wall motion.   CC: Patient Care Team: Lucille Passy, MD as PCP - General (Family Medicine) Annia Belt, MD as Consulting Physician (Oncology) Ailene Rud, MD as Consulting Physician (Urology) Lafayette Dragon, MD as Consulting Physician (Gastroenterology) Lelon Perla, MD as Consulting Physician (Cardiology)   Annia Belt, MD 12/15/20157:24 PM

## 2014-01-04 ENCOUNTER — Encounter: Payer: Self-pay | Admitting: Cardiology

## 2014-01-04 ENCOUNTER — Ambulatory Visit (INDEPENDENT_AMBULATORY_CARE_PROVIDER_SITE_OTHER): Payer: Medicare Other | Admitting: Cardiology

## 2014-01-04 VITALS — BP 132/68 | HR 79 | Ht 61.0 in | Wt 150.2 lb

## 2014-01-04 DIAGNOSIS — I2510 Atherosclerotic heart disease of native coronary artery without angina pectoris: Secondary | ICD-10-CM

## 2014-01-04 DIAGNOSIS — I10 Essential (primary) hypertension: Secondary | ICD-10-CM

## 2014-01-04 NOTE — Assessment & Plan Note (Signed)
Continue aspirin. Patient declines statins. Patient is very upset today about the loss of his wife due to pancreatic cancer. He does not want further cardiac testing stating I'm 80 years old and what's the point. He also stated he did not want further cardiology follow-up because he did not want further interventions, testing or bills. He appears to be depressed concerning his wife which is certainly appropriate. I have given him our card and asked him to contact us if we can ever be of assistance.

## 2014-01-04 NOTE — Patient Instructions (Signed)
Your physician recommends that you schedule a follow-up appointment in: AS NEEDED  

## 2014-01-04 NOTE — Assessment & Plan Note (Signed)
Blood pressure controlled. 

## 2014-01-04 NOTE — Assessment & Plan Note (Signed)
Patient declines statins.

## 2014-01-16 ENCOUNTER — Other Ambulatory Visit: Payer: Self-pay | Admitting: Cardiology

## 2014-01-16 MED ORDER — METOPROLOL SUCCINATE ER 25 MG PO TB24: 12.5000 mg | ORAL_TABLET | Freq: Every day | ORAL | Status: DC

## 2014-01-30 ENCOUNTER — Ambulatory Visit (INDEPENDENT_AMBULATORY_CARE_PROVIDER_SITE_OTHER): Payer: Medicare Other | Admitting: Internal Medicine

## 2014-01-30 ENCOUNTER — Encounter: Payer: Self-pay | Admitting: Internal Medicine

## 2014-01-30 ENCOUNTER — Ambulatory Visit (INDEPENDENT_AMBULATORY_CARE_PROVIDER_SITE_OTHER): Payer: Medicare Other

## 2014-01-30 VITALS — BP 124/70 | HR 79 | Temp 97.8°F | Wt 149.0 lb

## 2014-01-30 DIAGNOSIS — E538 Deficiency of other specified B group vitamins: Secondary | ICD-10-CM

## 2014-01-30 DIAGNOSIS — R21 Rash and other nonspecific skin eruption: Secondary | ICD-10-CM

## 2014-01-30 MED ORDER — CYANOCOBALAMIN 1000 MCG/ML IJ SOLN
1000.0000 ug | Freq: Once | INTRAMUSCULAR | Status: AC
  Administered 2014-01-30: 1000 ug via INTRAMUSCULAR

## 2014-01-30 MED ORDER — PREDNISONE 10 MG PO TABS: ORAL_TABLET | ORAL | Status: DC

## 2014-01-30 NOTE — Patient Instructions (Signed)

## 2014-01-30 NOTE — Progress Notes (Signed)
Pre visit review using our clinic review tool, if applicable. No additional management support is needed unless otherwise documented below in the visit note. 

## 2014-01-30 NOTE — Progress Notes (Signed)
HPI  [name] is a 81 year male presenting with c/o scaly, itchy lesions on both legs and right arm x 1 year, with increasing lesions x 2 months.  Multiple scabby lesions of varying sizes on both calves and ankles, and one small lesion on right wrist.  He has tried multiple OTC and Rx creams with mild improvement of itch. He had the first lesion biopsied from his right upper medial thigh 1 year ago from his dermatologist and was given a couple of different creams. He takes a shower once/day and changes his sheets regularly. He denies new foods and cleaning products. He claims no one else in his family has similar symptoms.    HPI  Review of Systems    Past Medical History  Diagnosis Date  . Coronary atherosclerosis of unspecified type of vessel, native or graft   . Personal history of colonic polyps     adenomatous  . Hypertrophy of prostate without urinary obstruction and other lower urinary tract symptoms (LUTS)   . Hypertension   . Hyperlipidemia   . Diverticulosis of colon (without mention of hemorrhage)   . Degeneration of intervertebral disc, site unspecified   . Cluster headache   . Pernicious anemia   . Neurogenic dysphagia   . Osteoarthritis   . CHF (congestive heart failure)   . Unspecified sleep apnea     does not have cpap-was not able to tolerate  . Prostate enlargement     urinary frequency, nocturia  . Thrombocytopenia, unspecified 09/01/2012  . Choledocholithiasis with obstruction 07/02/2013  . Abnormal LFTs   . Nephrolithiasis   . B12 deficiency   . Monocytosis 01/01/2014    Family History  Problem Relation Age of Onset  . Colon cancer Father 56  . Peripheral vascular disease Mother     History   Social History  . Marital Status: Married    Spouse Name: N/A    Number of Children: 2  . Years of Education: N/A   Occupational History  . retired    Social History Main Topics  . Smoking status: Former Smoker    Types: Pipe    Quit date: 01/18/1966   . Smokeless tobacco: Never Used  . Alcohol Use: No  . Drug Use: No  . Sexual Activity: Not on file   Other Topics Concern  . Not on file   Social History Narrative   Desires DNR.    Allergies  Allergen Reactions  . Penicillins Other (See Comments)    Whelps, passed out  . Sulfamethoxazole-Trimethoprim Rash  . Sulfonamide Derivatives Rash   Constitutional: Denies achiness, fever, abrupt weight changes, or SOB.  Skin: Positive for itchiness. Denies pain, bleeding, or allergies. Cardiovascular: Denies chest pain, chest tightness, palpitations or swelling in the hands or feet.   No other specific complaints in a complete review of systems (except as listed in HPI above).  Objective:  BP 124/70 mmHg  Pulse 79  Temp(Src) 97.8 F (36.6 C) (Oral)  Wt 149 lb (67.586 kg)  SpO2 98%  General: Pleasant but expresses frustration; intermittently itching right wrist and both ankles. Skin: Right leg - 1.5x79mm scaly lesion on mid calf, multiple smaller scabby lesions on ankle; Left leg - 1x81mm scaly lesion on distal calf, multiple smaller scabby lesions on ankle; Right arm - One 0.5x0.78mm lesion on wrist Cardiovascular: Normal rate and rhythm. S1,S2 noted.  No murmur, rubs or gallops noted.  Pulmonary/Chest: Normal effort and positive vesicular breath sounds. No respiratory distress. No  wheezes, rales or ronchi noted.      Assessment & Plan:  Rash and nonspecific skin eruption.  Prednisone 10 MG tablet taper x 6 days Continue to use OTC and Rx cream for itch relief.  RTC as needed or if symptoms persist.

## 2014-03-27 ENCOUNTER — Ambulatory Visit (INDEPENDENT_AMBULATORY_CARE_PROVIDER_SITE_OTHER): Payer: Medicare Other | Admitting: Family Medicine

## 2014-03-27 ENCOUNTER — Encounter: Payer: Self-pay | Admitting: Family Medicine

## 2014-03-27 VITALS — BP 128/72 | HR 70 | Temp 98.0°F | Wt 150.8 lb

## 2014-03-27 DIAGNOSIS — H938X3 Other specified disorders of ear, bilateral: Secondary | ICD-10-CM | POA: Insufficient documentation

## 2014-03-27 DIAGNOSIS — E538 Deficiency of other specified B group vitamins: Secondary | ICD-10-CM

## 2014-03-27 DIAGNOSIS — H6980 Other specified disorders of Eustachian tube, unspecified ear: Secondary | ICD-10-CM | POA: Insufficient documentation

## 2014-03-27 DIAGNOSIS — H938X9 Other specified disorders of ear, unspecified ear: Secondary | ICD-10-CM | POA: Insufficient documentation

## 2014-03-27 DIAGNOSIS — H6981 Other specified disorders of Eustachian tube, right ear: Secondary | ICD-10-CM

## 2014-03-27 MED ORDER — CYANOCOBALAMIN 1000 MCG/ML IJ SOLN: 1000.0000 ug | Freq: Once | INTRAMUSCULAR | Status: DC

## 2014-03-27 MED ORDER — CYANOCOBALAMIN 1000 MCG/ML IJ SOLN
1000.0000 ug | Freq: Once | INTRAMUSCULAR | Status: AC
  Administered 2014-03-27: 1000 ug via INTRAMUSCULAR

## 2014-03-27 MED ORDER — CYANOCOBALAMIN 1000 MCG/ML IJ SOLN: 1000.0000 ug | INTRAMUSCULAR | Status: DC

## 2014-03-27 NOTE — Progress Notes (Signed)
Pre visit review using our clinic review tool, if applicable. No additional management support is needed unless otherwise documented below in the visit note. 

## 2014-03-27 NOTE — Assessment & Plan Note (Signed)
New- small effusion, right. Discussed tx options. Since improving, he prefers watchful waiting which is appropriate. Call or return to clinic prn if these symptoms worsen or fail to improve as anticipated. The patient indicates understanding of these issues and agrees with the plan.

## 2014-03-27 NOTE — Progress Notes (Signed)
Subjective:   Patient ID: [name], male    DOB: 11-May-1933, 81 y.o.   MRN: [id number]  [name] is a pleasant 81 y.o. year old male who presents to clinic today with Ear Fullness  on 03/27/2014  HPI:  Bilateral ear fullness for a few days.  Feels it is actually a little better today. No ear pain or drainage.   Just wanted me to check to make sure he did not have any wax in his ears.  Current Outpatient Prescriptions on File Prior to Visit  Medication Sig Dispense Refill  . calcium carbonate (TUMS - DOSED IN MG ELEMENTAL CALCIUM) 500 MG chewable tablet Chew 2 tablets by mouth once.    . celecoxib (CELEBREX) 200 MG capsule Take 200 mg by mouth 2 (two) times daily as needed (for pain.).    Marland Kitchen clobetasol cream (TEMOVATE) 1.32 % Apply 1 application topically 2 (two) times daily. For whelps/itching    . co-enzyme Q-10 30 MG capsule Take 200 mg by mouth daily.    . metoprolol succinate (TOPROL-XL) 25 MG 24 hr tablet Take 0.5 tablets (12.5 mg total) by mouth daily. 30 tablet 3  . nitroGLYCERIN (NITROSTAT) 0.4 MG SL tablet Place 1 tablet (0.4 mg total) under the tongue every 5 (five) minutes as needed. Up to 3 doses 25 tablet 11  . Omega-3 Fatty Acids (FISH OIL) 1200 MG CAPS Take 1,200 mg by mouth daily.    . Tamsulosin HCl (FLOMAX) 0.4 MG CAPS Take 0.4 mg by mouth daily after supper.     No current facility-administered medications on file prior to visit.    Allergies  Allergen Reactions  . Penicillins Other (See Comments)    Whelps, passed out  . Sulfamethoxazole-Trimethoprim Rash  . Sulfonamide Derivatives Rash    Past Medical History  Diagnosis Date  . Coronary atherosclerosis of unspecified type of vessel, native or graft   . Personal history of colonic polyps     adenomatous  . Hypertrophy of prostate without urinary obstruction and other lower urinary tract symptoms (LUTS)   . Hypertension   . Hyperlipidemia   . Diverticulosis of colon (without mention of  hemorrhage)   . Degeneration of intervertebral disc, site unspecified   . Cluster headache   . Pernicious anemia   . Neurogenic dysphagia   . Osteoarthritis   . CHF (congestive heart failure)   . Unspecified sleep apnea     does not have cpap-was not able to tolerate  . Prostate enlargement     urinary frequency, nocturia  . Thrombocytopenia, unspecified 09/01/2012  . Choledocholithiasis with obstruction 07/02/2013  . Abnormal LFTs   . Nephrolithiasis   . B12 deficiency   . Monocytosis 01/01/2014    Past Surgical History  Procedure Laterality Date  . Inguinal herniorrhapy  2003  . Uvulectomy  1993  . Knee arthroscopy      5 times  . Total knee arthroplasty  2003  . Rotator cuff repair Right 2006  . Biopsy prostate  2008    Benign  . Tonsillectomy    . Total hip arthroplasty Right 2011  . Hernia repair  2004    RIH  . Bone marrow biopsy  2011  . Nasal endoscopy  2012  . Cholecystectomy  11/24/2010    Procedure: LAPAROSCOPIC CHOLECYSTECTOMY WITH INTRAOPERATIVE CHOLANGIOGRAM;  Surgeon: Earnstine Regal, MD;  Location: [facility];  Service: General;  Laterality: N/A;  c-arm  . Ercp N/A 07/04/2013    Procedure:  ENDOSCOPIC RETROGRADE CHOLANGIOPANCREATOGRAPHY (ERCP);  Surgeon: Gatha Mayer, MD;  Location: [facility];  Service: Endoscopy;  Laterality: N/A;  MAC if available    Family History  Problem Relation Age of Onset  . Colon cancer Father 63  . Peripheral vascular disease Mother     History   Social History  . Marital Status: Married    Spouse Name: N/A  . Number of Children: 2  . Years of Education: N/A   Occupational History  . retired    Social History Main Topics  . Smoking status: Former Smoker    Types: Pipe    Quit date: 01/18/1966  . Smokeless tobacco: Never Used  . Alcohol Use: No  . Drug Use: No  . Sexual Activity: Not on file   Other Topics Concern  . Not on file   Social History Narrative   Desires DNR.   The PMH, PSH, Social History, Family  History, Medications, and allergies have been reviewed in [facility], and have been updated if relevant.   Review of Systems  Constitutional: Negative.   HENT: Negative for ear discharge, ear pain and sinus pressure.        Objective:    BP 128/72 mmHg  Pulse 70  Temp(Src) 98 F (36.7 C) (Oral)  Wt 150 lb 12 oz (68.38 kg)  SpO2 97%   Physical Exam  Constitutional: He is oriented to person, place, and time. He appears well-developed and well-nourished. No distress.  HENT:  Head: Normocephalic and atraumatic.  Right Ear: Hearing normal. Tympanic membrane is not injected and not erythematous. A middle ear effusion is present.  Left Ear: Hearing and tympanic membrane normal.  Neurological: He is alert and oriented to person, place, and time. No cranial nerve deficit.  Skin: Skin is warm and dry.  Psychiatric: He has a normal mood and affect. His behavior is normal. Judgment and thought content normal.  Nursing note and vitals reviewed.         Assessment & Plan:   Ear pressure, bilateral  B12 deficiency - Plan: cyanocobalamin ((VITAMIN B-12)) injection 1,000 mcg No Follow-up on file.

## 2014-04-15 ENCOUNTER — Ambulatory Visit (INDEPENDENT_AMBULATORY_CARE_PROVIDER_SITE_OTHER): Payer: Medicare Other | Admitting: Family Medicine

## 2014-04-15 ENCOUNTER — Encounter: Payer: Self-pay | Admitting: Family Medicine

## 2014-04-15 VITALS — BP 110/66 | HR 77 | Temp 98.1°F | Wt 152.5 lb

## 2014-04-15 DIAGNOSIS — R21 Rash and other nonspecific skin eruption: Secondary | ICD-10-CM | POA: Diagnosis not present

## 2014-04-15 NOTE — Patient Instructions (Signed)
Good to see you. We are referring you to another dermatologist and will call with your lab results.

## 2014-04-15 NOTE — Progress Notes (Signed)
HPI  [name] is a 72 year pleasant mal, well known to me, withscaly, itchy lesions on both legs and right arm x 1 year, with increasing lesions now over his back and abdomen for past 2 weeks.   He had the first lesion biopsied from his right upper medial thigh 1 year ago from his dermatologist (Dr. Jarome Matin) and was given a several different steroid creams. He takes a shower once/day and changes his sheets regularly. He denies new foods and cleaning products. He claims no one else in his family has similar symptoms.  This did start as his wife, who has since passed, was very sick.  Saw Regina in 01/2014- given course of oral prednisone.  He felt this didn't help much and "hyped" him up.  Rash seems to be more itchy as time goes on. Oral antihistamines ineffective  Lab Results  Component Value Date   ALT 15 09/25/2013   AST 19 09/25/2013   ALKPHOS 89 09/25/2013   BILITOT 0.8 09/25/2013        Past Medical History  Diagnosis Date  . Coronary atherosclerosis of unspecified type of vessel, native or graft   . Personal history of colonic polyps     adenomatous  . Hypertrophy of prostate without urinary obstruction and other lower urinary tract symptoms (LUTS)   . Hypertension   . Hyperlipidemia   . Diverticulosis of colon (without mention of hemorrhage)   . Degeneration of intervertebral disc, site unspecified   . Cluster headache   . Pernicious anemia   . Neurogenic dysphagia   . Osteoarthritis   . CHF (congestive heart failure)   . Unspecified sleep apnea     does not have cpap-was not able to tolerate  . Prostate enlargement     urinary frequency, nocturia  . Thrombocytopenia, unspecified 09/01/2012  . Choledocholithiasis with obstruction 07/02/2013  . Abnormal LFTs   . Nephrolithiasis   . B12 deficiency   . Monocytosis 01/01/2014    Family History  Problem Relation Age of Onset  . Colon cancer Father 27  . Peripheral vascular disease Mother     History   Social  History  . Marital Status: Married    Spouse Name: N/A  . Number of Children: 2  . Years of Education: N/A   Occupational History  . retired    Social History Main Topics  . Smoking status: Former Smoker    Types: Pipe    Quit date: 01/18/1966  . Smokeless tobacco: Never Used  . Alcohol Use: No  . Drug Use: No  . Sexual Activity: Not on file   Other Topics Concern  . Not on file   Social History Narrative   Desires DNR.    Allergies  Allergen Reactions  . Penicillins Other (See Comments)    Whelps, passed out  . Sulfamethoxazole-Trimethoprim Rash  . Sulfonamide Derivatives Rash   ROS: Review of Systems  Constitutional: Negative.   Musculoskeletal: Negative.   Skin: Positive for rash.  Hematological: Negative.   Psychiatric/Behavioral: Negative.   All other systems reviewed and are negative.     Objective:  BP 110/66 mmHg  Pulse 77  Temp(Src) 98.1 F (36.7 C) (Oral)  Wt 152 lb 8 oz (69.174 kg)  SpO2 97%  General: Pleasant elderly male in NAD  Eyes:  Normal conjunctiva bilaterally Skin: Right leg - 1 x89mm scaly lesion on mid calf, multiple smaller scabby lesions on ankle, abdomen, wrist, back Cardiovascular: Normal rate and rhythm. S1,S2 noted.  No murmur, rubs or gallops noted.  Pulmonary/Chest: Normal effort and positive vesicular breath sounds. No respiratory distress. No wheezes, rales or ronchi noted.  Psych: good eye contact, not anxious or depressed appearing Ext:  No edema     Assessment & Plan:

## 2014-04-15 NOTE — Assessment & Plan Note (Signed)
Persistent and [name] is understandably frustrated. Will refer to another dermatologist for a second opinion.  He does not want to try any other oral or topical rxs until he sees derm since nothing has been effective thus far.

## 2014-04-15 NOTE — Progress Notes (Signed)
Pre visit review using our clinic review tool, if applicable. No additional management support is needed unless otherwise documented below in the visit note. 

## 2014-04-16 LAB — COMPREHENSIVE METABOLIC PANEL
ALT: 14 U/L (ref 0–53)
AST: 18 U/L (ref 0–37)
Albumin: 4 g/dL (ref 3.5–5.2)
Alkaline Phosphatase: 84 U/L (ref 39–117)
BILIRUBIN TOTAL: 0.5 mg/dL (ref 0.2–1.2)
BUN: 12 mg/dL (ref 6–23)
CALCIUM: 9.3 mg/dL (ref 8.4–10.5)
CO2: 29 meq/L (ref 19–32)
Chloride: 108 mEq/L (ref 96–112)
Creatinine, Ser: 1.04 mg/dL (ref 0.40–1.50)
GFR: 73.31 mL/min (ref >60.00)
Glucose, Bld: 87 mg/dL (ref 70–99)
Potassium: 4.6 mEq/L (ref 3.5–5.1)
SODIUM: 141 meq/L (ref 135–145)
TOTAL PROTEIN: 5.9 g/dL — AB (ref 6.0–8.3)

## 2014-04-17 DIAGNOSIS — L308 Other specified dermatitis: Secondary | ICD-10-CM | POA: Diagnosis not present

## 2014-04-17 DIAGNOSIS — L300 Nummular dermatitis: Secondary | ICD-10-CM | POA: Diagnosis not present

## 2014-04-17 DIAGNOSIS — D0462 Carcinoma in situ of skin of left upper limb, including shoulder: Secondary | ICD-10-CM | POA: Diagnosis not present

## 2014-05-07 ENCOUNTER — Ambulatory Visit (INDEPENDENT_AMBULATORY_CARE_PROVIDER_SITE_OTHER): Payer: Medicare Other | Admitting: *Deleted

## 2014-05-07 DIAGNOSIS — D519 Vitamin B12 deficiency anemia, unspecified: Secondary | ICD-10-CM | POA: Diagnosis not present

## 2014-05-07 MED ORDER — CYANOCOBALAMIN 1000 MCG/ML IJ SOLN
1000.0000 ug | Freq: Once | INTRAMUSCULAR | Status: AC
  Administered 2014-05-07: 1000 ug via INTRAMUSCULAR

## 2014-05-16 DIAGNOSIS — C44629 Squamous cell carcinoma of skin of left upper limb, including shoulder: Secondary | ICD-10-CM | POA: Diagnosis not present

## 2014-06-26 ENCOUNTER — Ambulatory Visit (INDEPENDENT_AMBULATORY_CARE_PROVIDER_SITE_OTHER): Payer: Medicare Other

## 2014-06-26 DIAGNOSIS — E538 Deficiency of other specified B group vitamins: Secondary | ICD-10-CM | POA: Diagnosis not present

## 2014-06-26 MED ORDER — CYANOCOBALAMIN 1000 MCG/ML IJ SOLN
1000.0000 ug | Freq: Once | INTRAMUSCULAR | Status: AC
  Administered 2014-06-26: 1000 ug via INTRAMUSCULAR

## 2014-06-27 DIAGNOSIS — L570 Actinic keratosis: Secondary | ICD-10-CM | POA: Diagnosis not present

## 2014-06-27 DIAGNOSIS — L820 Inflamed seborrheic keratosis: Secondary | ICD-10-CM | POA: Diagnosis not present

## 2014-06-27 DIAGNOSIS — Z08 Encounter for follow-up examination after completed treatment for malignant neoplasm: Secondary | ICD-10-CM | POA: Diagnosis not present

## 2014-06-27 DIAGNOSIS — Z85828 Personal history of other malignant neoplasm of skin: Secondary | ICD-10-CM | POA: Diagnosis not present

## 2014-06-27 DIAGNOSIS — L300 Nummular dermatitis: Secondary | ICD-10-CM | POA: Diagnosis not present

## 2014-07-01 ENCOUNTER — Ambulatory Visit (INDEPENDENT_AMBULATORY_CARE_PROVIDER_SITE_OTHER): Payer: Medicare Other | Admitting: Oncology

## 2014-07-01 ENCOUNTER — Encounter: Payer: Self-pay | Admitting: Oncology

## 2014-07-01 VITALS — BP 140/73 | HR 73 | Temp 97.7°F | Ht 60.0 in | Wt 152.3 lb

## 2014-07-01 DIAGNOSIS — D510 Vitamin B12 deficiency anemia due to intrinsic factor deficiency: Secondary | ICD-10-CM | POA: Diagnosis not present

## 2014-07-01 DIAGNOSIS — D72821 Monocytosis (symptomatic): Secondary | ICD-10-CM | POA: Diagnosis not present

## 2014-07-01 DIAGNOSIS — L989 Disorder of the skin and subcutaneous tissue, unspecified: Secondary | ICD-10-CM | POA: Diagnosis not present

## 2014-07-01 DIAGNOSIS — D696 Thrombocytopenia, unspecified: Secondary | ICD-10-CM | POA: Diagnosis not present

## 2014-07-01 DIAGNOSIS — I2510 Atherosclerotic heart disease of native coronary artery without angina pectoris: Secondary | ICD-10-CM

## 2014-07-01 DIAGNOSIS — Z862 Personal history of diseases of the blood and blood-forming organs and certain disorders involving the immune mechanism: Secondary | ICD-10-CM | POA: Diagnosis not present

## 2014-07-01 LAB — CBC WITH DIFFERENTIAL/PLATELET
Basophils Absolute: 0 10*3/uL (ref 0.0–0.1)
Basophils Relative: 0 % (ref 0–1)
EOS ABS: 0.1 10*3/uL (ref 0.0–0.7)
EOS PCT: 1 % (ref 0–5)
HEMATOCRIT: 45.3 % (ref 39.0–52.0)
HEMOGLOBIN: 15 g/dL (ref 13.0–17.0)
Lymphocytes Relative: 23 % (ref 12–46)
Lymphs Abs: 1.4 10*3/uL (ref 0.7–4.0)
MCH: 27.8 pg (ref 26.0–34.0)
MCHC: 33.1 g/dL (ref 30.0–36.0)
MCV: 84 fL (ref 78.0–100.0)
MPV: 10.9 fL (ref 8.6–12.4)
Monocytes Absolute: 1.8 10*3/uL — ABNORMAL HIGH (ref 0.1–1.0)
Monocytes Relative: 30 % — ABNORMAL HIGH (ref 3–12)
NEUTROS ABS: 2.8 10*3/uL (ref 1.7–7.7)
NEUTROS PCT: 46 % (ref 43–77)
Platelets: 205 10*3/uL (ref 150–400)
RBC: 5.39 MIL/uL (ref 4.22–5.81)
RDW: 15.4 % (ref 11.5–15.5)
WBC: 6 10*3/uL (ref 4.0–10.5)

## 2014-07-01 LAB — SAVE SMEAR

## 2014-07-01 NOTE — Progress Notes (Signed)
Patient ID: [name], male   DOB: [birth year], 81 y.o.   MRN: [id number] Hematology and Oncology Follow Up Visit  AIDIN DOANE [id number] [birth year] 81 y.o. 07/01/2014 5:39 PM   Principle Diagnosis: Encounter Diagnoses  Name Primary?  . ANEMIA, PERNICIOUS Yes  . Thrombocytopenia   . Monocytosis   Clinical Summary: 81 year old man initially referred for evaluation of pancytopenia with macrocytic anemia in December 2010. He was diagnosed with pernicious anemia and started on B12 replacement. He had dramatic improvement in his hemoglobin but white count and platelet count remained borderline decreased. He had a peripheral blood monocytosis. Bone marrow aspiration and biopsy done 01/30/2009 to exclude a myelodysplastic syndrome. Marrow hypercellular for age but otherwise normal. No increased blasts. No ring sideroblasts. Normal cytogenetics. Over time white count normalized but there is a persistent monocytosis. Hemoglobin remains normal on B12 replacement. He has had a fluctuating platelet count. On some occasions the low platelet count was clearly spurious due to platelet clumping in vitro. He had an  episode of what was likely thrombocytopenia secondary to platelet consumption when he developed biliary obstruction related to retained common duct bile stones requiring ERCP back in June, 2015. Platelets fell as low as 42,000. With treatment of biliary infection, platelet count completely recovered and in fact, rebounded up to 348,000 by July 7. Count done  on 12/25/2013 remained normal at 181,000. Hemoglobin 15. White count 5900 with 51% neutrophils, 15 lymphocytes, 34% monocytes.   Interim History:   He is still having some problems with his skin has been seen by dermatologist, Dr. Allyson Sabal. He's had a number of biopsies. He does have some cutaneous nonmelanoma skin cancers. He has not had any bleeding or bruising. No other interim medical problems.  Medications: reviewed  Allergies:   Allergies  Allergen Reactions  . Penicillins Other (See Comments)    Whelps, passed out  . Sulfamethoxazole-Trimethoprim Rash  . Sulfonamide Derivatives Rash    Review of Systems: See history of present illness. Remaining ROS negative:   Physical Exam: Blood pressure 140/73, pulse 73, temperature 97.7 F (36.5 C), temperature source Oral, height 5' (1.524 m), weight 152 lb 4.8 oz (69.083 kg), SpO2 99 %. Wt Readings from Last 3 Encounters:  07/01/14 152 lb 4.8 oz (69.083 kg)  04/15/14 152 lb 8 oz (69.174 kg)  03/27/14 150 lb 12 oz (68.38 kg)     General appearance: Well-nourished Caucasian man HENNT: Pharynx no erythema, exudate, mass, or ulcer. No thyromegaly or thyroid nodules Lymph nodes: No cervical, supraclavicular, or axillary lymphadenopathy Breasts:  Lungs: Clear to auscultation, resonant to percussion throughout Heart: Regular rhythm, no murmur, no gallop, no rub, no click, no edema Abdomen: Soft, nontender, normal bowel sounds, no mass, no organomegaly Extremities: No edema, no calf tenderness Musculoskeletal: no joint deformities. Scar status post bilateral knee replacements. Previous right hip replacement. GU:  Vascular: Carotid pulses 2+, no bruits, distal pulses: Dorsalis pedis 1+ symmetric Neurologic: Alert, oriented, PERRLA, optic discs sharp and vessels normal, no hemorrhage or exudate, cranial nerves grossly normal, motor strength 5 over 5, reflexes 1+ symmetric, upper body coordination normal, gait normal, Skin: No rash or ecchymosis.  Lab Results: CBC W/Diff: 07/01/14: Hb 15, WBC 6,000 46 polys, 23 lymphs, 30 monos, platelets 205,000    Component Value Date/Time   WBC 5.9 12/25/2013 1019   WBC 6.3 01/16/2013 1225   RBC 5.31 12/25/2013 1019   RBC 5.17 01/16/2013 1225   RBC 4.21* 02/26/2009 1014   HGB 15.0 12/25/2013 1019  HGB 14.3 01/16/2013 1225   HCT 44.2 12/25/2013 1019   HCT 43.8 01/16/2013 1225   PLT 181 12/25/2013 1019   PLT 138* 01/16/2013  1225   MCV 83.2 12/25/2013 1019   MCV 84.7 01/16/2013 1225   MCH 28.2 12/25/2013 1019   MCH 27.7 01/16/2013 1225   MCHC 33.9 12/25/2013 1019   MCHC 32.6 01/16/2013 1225   RDW 14.9 12/25/2013 1019   RDW 14.5 01/16/2013 1225   LYMPHSABS 0.9 12/25/2013 1019   LYMPHSABS 0.7* 01/16/2013 1225   MONOABS 2.0* 12/25/2013 1019   MONOABS 1.9* 01/16/2013 1225   EOSABS 0.0 12/25/2013 1019   EOSABS 0.0 01/16/2013 1225   BASOSABS 0.0 12/25/2013 1019   BASOSABS 0.0 01/16/2013 1225     Chemistry      Component Value Date/Time   NA 141 04/15/2014 1603   K 4.6 04/15/2014 1603   CL 108 04/15/2014 1603   CO2 29 04/15/2014 1603   BUN 12 04/15/2014 1603   CREATININE 1.04 04/15/2014 1603   CREATININE 0.75 07/19/2013 1024      Component Value Date/Time   CALCIUM 9.3 04/15/2014 1603   ALKPHOS 84 04/15/2014 1603   AST 18 04/15/2014 1603   ALT 14 04/15/2014 1603   BILITOT 0.5 04/15/2014 1603    CBC today result pending but oddly I have a referral blood film which shows a persistent monocytosis, reactive lymphocytes, and normal platelets greater than 10 per high-power field.   Radiological Studies: No results found.  Impression:  #1. Pernicious anemia. Ongoing complete response to B12 replacement. Continue monthly B12 injections.  #2. Persistent, unexplained monocytosis with normal bone marrow biopsy.  #3. thrombocytopenia-transient, resolved Note previous occasions of spurious thrombocytopenia secondary to in vitro platelet clumping  #4. Skin lesions-ongoing follow-up with dermatology  #5. History of elevated PSA followed by urology Previous biopsies negative for cancer 2.  #6. Coronary artery disease Stress Myoview study 01/29/2013 was normal. No areas of ischemia. Estimated left ventricular ejection fraction 82% with normal wall motion.   CC: Patient Care Team: Lucille Passy, MD as PCP - General (Family Medicine) Annia Belt, MD as Consulting Physician  (Oncology) Carolan Clines, MD as Consulting Physician (Urology) Lafayette Dragon, MD as Consulting Physician (Gastroenterology) Lelon Perla, MD as Consulting Physician (Cardiology)   Annia Belt, MD 6/13/20165:39 PM

## 2014-07-01 NOTE — Patient Instructions (Signed)
To lab today Return visit with Dr Darnell Level in 1 year lab 1 week before visit

## 2014-07-04 ENCOUNTER — Telehealth: Payer: Self-pay | Admitting: *Deleted

## 2014-07-04 NOTE — Telephone Encounter (Signed)
-----   Message from Annia Belt, MD sent at 07/02/2014  7:19 AM EDT ----- Call pt: blood counts good

## 2014-07-04 NOTE — Telephone Encounter (Signed)
Called pt - no answer; left message his blood counts are good per Dr Beryle Beams. And to call if he has any questions.

## 2014-07-30 ENCOUNTER — Ambulatory Visit (INDEPENDENT_AMBULATORY_CARE_PROVIDER_SITE_OTHER): Payer: Medicare Other

## 2014-07-30 DIAGNOSIS — E538 Deficiency of other specified B group vitamins: Secondary | ICD-10-CM

## 2014-07-30 MED ORDER — CYANOCOBALAMIN 1000 MCG/ML IJ SOLN
1000.0000 ug | Freq: Once | INTRAMUSCULAR | Status: AC
  Administered 2014-07-30: 1000 ug via INTRAMUSCULAR

## 2014-08-10 DIAGNOSIS — L03115 Cellulitis of right lower limb: Secondary | ICD-10-CM | POA: Diagnosis not present

## 2014-08-12 DIAGNOSIS — R972 Elevated prostate specific antigen [PSA]: Secondary | ICD-10-CM | POA: Diagnosis not present

## 2014-08-12 DIAGNOSIS — N138 Other obstructive and reflux uropathy: Secondary | ICD-10-CM | POA: Diagnosis not present

## 2014-08-12 DIAGNOSIS — N401 Enlarged prostate with lower urinary tract symptoms: Secondary | ICD-10-CM | POA: Diagnosis not present

## 2014-08-30 ENCOUNTER — Ambulatory Visit (INDEPENDENT_AMBULATORY_CARE_PROVIDER_SITE_OTHER): Payer: Medicare Other

## 2014-08-30 DIAGNOSIS — E538 Deficiency of other specified B group vitamins: Secondary | ICD-10-CM | POA: Diagnosis not present

## 2014-08-30 MED ORDER — CYANOCOBALAMIN 1000 MCG/ML IJ SOLN
1000.0000 ug | Freq: Once | INTRAMUSCULAR | Status: AC
  Administered 2014-08-30: 1000 ug via INTRAMUSCULAR

## 2014-10-09 ENCOUNTER — Other Ambulatory Visit: Payer: Self-pay | Admitting: *Deleted

## 2014-10-09 DIAGNOSIS — M12811 Other specific arthropathies, not elsewhere classified, right shoulder: Secondary | ICD-10-CM | POA: Diagnosis not present

## 2014-10-09 DIAGNOSIS — M19012 Primary osteoarthritis, left shoulder: Secondary | ICD-10-CM | POA: Diagnosis not present

## 2014-10-09 MED ORDER — METOPROLOL SUCCINATE ER 25 MG PO TB24: 12.5000 mg | ORAL_TABLET | Freq: Every day | ORAL | Status: DC

## 2014-10-11 ENCOUNTER — Other Ambulatory Visit: Payer: Self-pay | Admitting: Cardiology

## 2014-10-11 MED ORDER — METOPROLOL SUCCINATE ER 25 MG PO TB24: 12.5000 mg | ORAL_TABLET | Freq: Every day | ORAL | Status: DC

## 2014-10-24 ENCOUNTER — Ambulatory Visit (INDEPENDENT_AMBULATORY_CARE_PROVIDER_SITE_OTHER): Payer: Medicare Other

## 2014-10-24 DIAGNOSIS — Z23 Encounter for immunization: Secondary | ICD-10-CM

## 2014-10-24 DIAGNOSIS — E538 Deficiency of other specified B group vitamins: Secondary | ICD-10-CM

## 2014-10-24 MED ORDER — CYANOCOBALAMIN 1000 MCG/ML IJ SOLN
1000.0000 ug | Freq: Once | INTRAMUSCULAR | Status: AC
  Administered 2014-10-24: 1000 ug via INTRAMUSCULAR

## 2014-11-01 ENCOUNTER — Encounter: Payer: Self-pay | Admitting: Primary Care

## 2014-11-01 ENCOUNTER — Ambulatory Visit (INDEPENDENT_AMBULATORY_CARE_PROVIDER_SITE_OTHER): Payer: Medicare Other | Admitting: Primary Care

## 2014-11-01 ENCOUNTER — Telehealth: Payer: Self-pay | Admitting: Family Medicine

## 2014-11-01 VITALS — BP 110/62 | HR 84 | Temp 98.0°F | Ht 60.0 in | Wt 149.1 lb

## 2014-11-01 DIAGNOSIS — R5383 Other fatigue: Secondary | ICD-10-CM | POA: Diagnosis not present

## 2014-11-01 DIAGNOSIS — E538 Deficiency of other specified B group vitamins: Secondary | ICD-10-CM

## 2014-11-01 LAB — COMPREHENSIVE METABOLIC PANEL
ALT: 14 U/L (ref 0–53)
AST: 17 U/L (ref 0–37)
Albumin: 4 g/dL (ref 3.5–5.2)
Alkaline Phosphatase: 93 U/L (ref 39–117)
BUN: 10 mg/dL (ref 6–23)
CALCIUM: 9 mg/dL (ref 8.4–10.5)
CHLORIDE: 105 meq/L (ref 96–112)
CO2: 28 mEq/L (ref 19–32)
Creatinine, Ser: 0.93 mg/dL (ref 0.40–1.50)
GFR: 83.29 mL/min (ref >60.00)
Glucose, Bld: 91 mg/dL (ref 70–99)
Potassium: 4 mEq/L (ref 3.5–5.1)
Sodium: 141 mEq/L (ref 135–145)
Total Bilirubin: 0.6 mg/dL (ref 0.2–1.2)
Total Protein: 6.1 g/dL (ref 6.0–8.3)

## 2014-11-01 LAB — VITAMIN B12: Vitamin B-12: 662 pg/mL (ref 211–911)

## 2014-11-01 LAB — POCT URINALYSIS DIPSTICK
Glucose, UA: NEGATIVE
Ketones, UA: NEGATIVE
Leukocytes, UA: NEGATIVE
Nitrite, UA: NEGATIVE
PH UA: 5.5
PROTEIN UA: NEGATIVE
RBC UA: NEGATIVE
UROBILINOGEN UA: NEGATIVE

## 2014-11-01 LAB — CBC WITH DIFFERENTIAL/PLATELET
Basophils Absolute: 0 10*3/uL (ref 0.0–0.1)
Basophils Relative: 0.5 % (ref 0.0–3.0)
EOS ABS: 0 10*3/uL (ref 0.0–0.7)
Eosinophils Relative: 0.4 % (ref 0.0–5.0)
HEMATOCRIT: 46.1 % (ref 39.0–52.0)
Hemoglobin: 15.7 g/dL (ref 13.0–17.0)
LYMPHS PCT: 12.6 % (ref 12.0–46.0)
Lymphs Abs: 1 10*3/uL (ref 0.7–4.0)
MCHC: 34.1 g/dL (ref 30.0–36.0)
MCV: 85.1 fl (ref 78.0–100.0)
MONOS PCT: 34.5 % — AB (ref 3.0–12.0)
Monocytes Absolute: 2.8 10*3/uL — ABNORMAL HIGH (ref 0.1–1.0)
NEUTROS ABS: 4.2 10*3/uL (ref 1.4–7.7)
Neutrophils Relative %: 52 % (ref 43.0–77.0)
Platelets: 58 10*3/uL — ABNORMAL LOW (ref 150.0–400.0)
RBC: 5.42 Mil/uL (ref 4.22–5.81)
RDW: 15.5 % (ref 11.5–15.5)
WBC: 8.1 10*3/uL (ref 4.0–10.5)

## 2014-11-01 LAB — TSH: TSH: 0.63 u[IU]/mL (ref 0.35–4.50)

## 2014-11-01 LAB — HEMOGLOBIN A1C: HEMOGLOBIN A1C: 5.5 % (ref 4.6–6.5)

## 2014-11-01 LAB — VITAMIN D 25 HYDROXY (VIT D DEFICIENCY, FRACTURES): VITD: 31.74 ng/mL (ref 30.00–100.00)

## 2014-11-01 NOTE — Progress Notes (Signed)
Pre visit review using our clinic review tool, if applicable. No additional management support is needed unless otherwise documented below in the visit note. 

## 2014-11-01 NOTE — Telephone Encounter (Signed)
[facility]  Patient Name: Dhruvan Noffke  DOB: [birth year]    Initial Comment caller states his BP is low and he is feeling fatigued   Nurse Assessment  Nurse: Harlow Mares, RN, Suanne Marker Date/Time Eilene Ghazi Time): 11/01/2014 11:31:45 AM  Confirm and document reason for call. If symptomatic, describe symptoms. ---Caller states his BP is low and he is feeling fatigued. BP is currently 99/68. Reports that he thinks it is possible that he was sent a different strength of his BP med via mail order. (He is not near his meds at the moment) Reports that he is taking Toprol 4mg  q am, Baby ASA, vitamin, CoQ 10. Reports his last MD appt was 6 months ago or longer. Reports that he noted yesterday and that he was more tired than usual; his hearing changed when he suddenly stood up. Denied dizziness.  Has the patient traveled out of the country within the last 30 days? ---No  Does the patient have any new or worsening symptoms? ---Yes  Will a triage be completed? ---Yes  Related visit to physician within the last 2 weeks? ---No  Does the PT have any chronic conditions? (i.e. diabetes, asthma, etc.) ---Yes  List chronic conditions. ---hx hypertension; pernicious anemia     Guidelines    Guideline Title Affirmed Question Affirmed Notes       Final Disposition User   Send To Clinical Follow Up Jeral Fruit    Comments  Appt scheduled for today at 2pm @ the [facility] location.

## 2014-11-01 NOTE — Progress Notes (Signed)
Subjective:    Patient ID: [name], male    DOB: 28-Dec-1933, 81 y.o.   MRN: [id number]  HPI  [name] is a 81 year old male who presents today with a chief complaint of fatigue. His fatigue started yesterday, he noticed it moreso after rising from a sitting position. He checked his blood pressure at [facility] this morning which was 99/68. He ate a biscuit and then had it rechecked it at [facility] and it was the same. He typically runs about 110-120's/70's. He is currently taking metoprolol succinate 25 mg. His BP in the clinic today is 110/62. He's been battling a rash that has been present for 1 year and is currently being treated by dermatology. Denies fevers, abdominal pain, urinary symptoms, uni-lateral weakness, slurred speech.  Review of Systems  Constitutional: Positive for fatigue. Negative for fever and chills.  Respiratory: Negative for shortness of breath.   Cardiovascular: Negative for chest pain.  Gastrointestinal: Negative for nausea, abdominal pain and blood in stool.  Genitourinary: Positive for frequency. Negative for dysuria, hematuria and difficulty urinating.  Skin: Positive for rash.  Neurological: Positive for weakness. Negative for dizziness.       Past Medical History  Diagnosis Date  . Coronary atherosclerosis of unspecified type of vessel, native or graft   . Personal history of colonic polyps     adenomatous  . Hypertrophy of prostate without urinary obstruction and other lower urinary tract symptoms (LUTS)   . Hypertension   . Hyperlipidemia   . Diverticulosis of colon (without mention of hemorrhage)   . Degeneration of intervertebral disc, site unspecified   . Cluster headache   . Pernicious anemia   . Neurogenic dysphagia   . Osteoarthritis   . CHF (congestive heart failure) ([place])   . Unspecified sleep apnea     does not have cpap-was not able to tolerate  . Prostate enlargement     urinary frequency, nocturia  . Thrombocytopenia,  unspecified ([place]) 09/01/2012  . Choledocholithiasis with obstruction 07/02/2013  . Abnormal LFTs   . Nephrolithiasis   . B12 deficiency   . Monocytosis 01/01/2014    Social History   Social History  . Marital Status: Married    Spouse Name: N/A  . Number of Children: 2  . Years of Education: N/A   Occupational History  . retired    Social History Main Topics  . Smoking status: Former Smoker    Types: Pipe    Quit date: 01/18/1966  . Smokeless tobacco: Never Used  . Alcohol Use: No  . Drug Use: No  . Sexual Activity: Not on file   Other Topics Concern  . Not on file   Social History Narrative   Desires DNR.    Past Surgical History  Procedure Laterality Date  . Inguinal herniorrhapy  2003  . Uvulectomy  1993  . Knee arthroscopy      5 times  . Total knee arthroplasty  2003  . Rotator cuff repair Right 2006  . Biopsy prostate  2008    Benign  . Tonsillectomy    . Total hip arthroplasty Right 2011  . Hernia repair  2004    RIH  . Bone marrow biopsy  2011  . Nasal endoscopy  2012  . Cholecystectomy  11/24/2010    Procedure: LAPAROSCOPIC CHOLECYSTECTOMY WITH INTRAOPERATIVE CHOLANGIOGRAM;  Surgeon: Earnstine Regal, MD;  Location: [facility];  Service: General;  Laterality: N/A;  c-arm  . Ercp N/A 07/04/2013  Procedure: ENDOSCOPIC RETROGRADE CHOLANGIOPANCREATOGRAPHY (ERCP);  Surgeon: Gatha Mayer, MD;  Location: [facility];  Service: Endoscopy;  Laterality: N/A;  MAC if available    Family History  Problem Relation Age of Onset  . Colon cancer Father 35  . Peripheral vascular disease Mother     Allergies  Allergen Reactions  . Penicillins Other (See Comments)    Whelps, passed out  . Sulfamethoxazole-Trimethoprim Rash  . Sulfonamide Derivatives Rash    Current Outpatient Prescriptions on File Prior to Visit  Medication Sig Dispense Refill  . calcium carbonate (TUMS - DOSED IN MG ELEMENTAL CALCIUM) 500 MG chewable tablet Chew 2 tablets by mouth once.    .  clobetasol cream (TEMOVATE) 3.73 % Apply 1 application topically 2 (two) times daily. For whelps/itching    . co-enzyme Q-10 30 MG capsule Take 200 mg by mouth daily.    . cyanocobalamin (,VITAMIN B-12,) 1000 MCG/ML injection Inject 1 mL (1,000 mcg total) into the muscle every 30 (thirty) days. 1 mL 0  . metoprolol succinate (TOPROL-XL) 25 MG 24 hr tablet Take 0.5 tablets (12.5 mg total) by mouth daily. PLEASE CALL OFFICE TO MAKE APPOINTMENT FOR MORE REFILLS. 30 tablet 3  . nitroGLYCERIN (NITROSTAT) 0.4 MG SL tablet Place 1 tablet (0.4 mg total) under the tongue every 5 (five) minutes as needed. Up to 3 doses 25 tablet 11  . Omega-3 Fatty Acids (FISH OIL) 1200 MG CAPS Take 1,200 mg by mouth daily.    . Tamsulosin HCl (FLOMAX) 0.4 MG CAPS Take 0.4 mg by mouth daily after supper.    . celecoxib (CELEBREX) 200 MG capsule Take 200 mg by mouth 2 (two) times daily as needed (for pain.).     No current facility-administered medications on file prior to visit.    BP 110/62 mmHg  Pulse 84  Temp(Src) 98 F (36.7 C) (Oral)  Ht 5' (1.524 m)  Wt 149 lb 1.9 oz (67.64 kg)  BMI 29.12 kg/m2  SpO2 98%    Objective:   Physical Exam  Constitutional: He is oriented to person, place, and time. He appears well-nourished. He does not appear ill.  Neck: Neck supple.  Cardiovascular: Normal rate and regular rhythm.   Pulmonary/Chest: Effort normal and breath sounds normal.  Abdominal: Soft. Bowel sounds are normal. There is no tenderness. There is no CVA tenderness.  Neurological: He is alert and oriented to person, place, and time.  Skin: Skin is warm and dry.          Assessment & Plan:  Fatigue:  Present since yesterday with lower BP readings this morning. BP stable in clinic today. Orthostatics performed and are negative. UA: Negative Will obtain CBC, CMP, TSH, Vitamin B12, A1C, Vitamin D today. Does not appear acutely ill or in distress. Discussed hydration as he does not drink much  water. ER precautions provided over the weekend. Follow up PRN.

## 2014-11-01 NOTE — Telephone Encounter (Signed)
TH scheduled appt 11/01/14 at 2 pm with Allie Bossier NP.

## 2014-11-01 NOTE — Patient Instructions (Signed)
Your blood pressure looks great. You are not "orthostatic". Your urine test is negative.  Complete lab work prior to leaving today. I will notify you of your results.  Stay hydrated with water. If you start to feel worse over the weekend you may want to go to the Urgent Care or emergency department.  It was a pleasure meeting you!

## 2014-11-06 DIAGNOSIS — M12811 Other specific arthropathies, not elsewhere classified, right shoulder: Secondary | ICD-10-CM | POA: Diagnosis not present

## 2014-11-06 DIAGNOSIS — M19012 Primary osteoarthritis, left shoulder: Secondary | ICD-10-CM | POA: Diagnosis not present

## 2014-11-12 ENCOUNTER — Telehealth: Payer: Self-pay | Admitting: *Deleted

## 2014-11-12 ENCOUNTER — Other Ambulatory Visit: Payer: Self-pay | Admitting: Oncology

## 2014-11-12 DIAGNOSIS — D696 Thrombocytopenia, unspecified: Secondary | ICD-10-CM

## 2014-11-12 NOTE — Telephone Encounter (Signed)
Talked to pt - lab appt scheduled for tomorrow @ 0900AM.

## 2014-11-12 NOTE — Telephone Encounter (Signed)
Called pt about coming in for labs, surgical clearance, per Dr Beryle Beams; no answer - left message to call [facility] .

## 2014-11-13 ENCOUNTER — Other Ambulatory Visit (INDEPENDENT_AMBULATORY_CARE_PROVIDER_SITE_OTHER): Payer: Medicare Other

## 2014-11-13 DIAGNOSIS — D696 Thrombocytopenia, unspecified: Secondary | ICD-10-CM

## 2014-11-13 LAB — CBC WITH DIFFERENTIAL/PLATELET
Basophils Absolute: 0 10*3/uL (ref 0.0–0.1)
Basophils Relative: 0 %
Eosinophils Absolute: 0 10*3/uL (ref 0.0–0.7)
Eosinophils Relative: 1 %
HEMATOCRIT: 47.9 % (ref 39.0–52.0)
HEMOGLOBIN: 16.2 g/dL (ref 13.0–17.0)
LYMPHS ABS: 0.8 10*3/uL (ref 0.7–4.0)
LYMPHS PCT: 12 %
MCH: 29.3 pg (ref 26.0–34.0)
MCHC: 33.8 g/dL (ref 30.0–36.0)
MCV: 86.6 fL (ref 78.0–100.0)
Monocytes Absolute: 1.9 10*3/uL — ABNORMAL HIGH (ref 0.1–1.0)
Monocytes Relative: 27 %
NEUTROS PCT: 61 %
Neutro Abs: 4.4 10*3/uL (ref 1.7–7.7)
Platelets: 51 10*3/uL — ABNORMAL LOW (ref 150–400)
RBC: 5.53 MIL/uL (ref 4.22–5.81)
RDW: 15.1 % (ref 11.5–15.5)
WBC: 7.2 10*3/uL (ref 4.0–10.5)

## 2014-11-13 LAB — SAVE SMEAR

## 2014-11-15 ENCOUNTER — Other Ambulatory Visit: Payer: Self-pay | Admitting: Oncology

## 2014-11-15 ENCOUNTER — Encounter: Payer: Self-pay | Admitting: Oncology

## 2014-11-15 DIAGNOSIS — D696 Thrombocytopenia, unspecified: Secondary | ICD-10-CM

## 2014-11-15 NOTE — Progress Notes (Signed)
Patient ID: [name], male   DOB: [birth year], 81 y.o.   MRN: [id number] I called patient to review lab results. I received a request to clear patient for shoulder replacement surgery but CBC showed platelet count of 58,000. He has has intermittent falls in his platelet count in the past due to in vitro clumping. I had patient repeat a CBC with blood drawn in citrate anticoagulant. Platelet count still low at 51,000 which I confirmed on review of the peripheral blood film. His platelet count was over 200,000 just 4 months ago. I can not clear him for elective surgery at this time. I will repeat a platelet count again in 3 weeks. He is not at risk for spontaneous bleeding at current count. If surgery becomes urgent and platelet count does not spontaneously recover to normal, I told him I will consider a short course of prednisone as this is likely chronic ITP.

## 2014-11-17 NOTE — Progress Notes (Signed)
    HPI: FU coronary artery disease by cardiac catheterization in 2000. At that time he had a 60-70% proximal LAD, 70-80% stenosis in a small intermediate and a 20% stenosis in the mid right coronary artery. His LV function was normal. His most recent Myoview was performed in Jan 2015. At that time, his ejection fraction was 82% and there was no ischemia or infarction. Abdominal CT June 2015 showed no aneurysm. Since last seen, Patient has limited mobility because of arthralgias. He has mild dyspnea on exertion but no orthopnea, PND, pedal edema, chest pain or syncope.  Current Outpatient Prescriptions  Medication Sig Dispense Refill  . calcium carbonate (TUMS - DOSED IN MG ELEMENTAL CALCIUM) 500 MG chewable tablet Chew 2 tablets by mouth once.    . celecoxib (CELEBREX) 200 MG capsule Take 200 mg by mouth 2 (two) times daily as needed (for pain.).    . clobetasol cream (TEMOVATE) 0.05 % Apply 1 application topically 2 (two) times daily. For whelps/itching    . co-enzyme Q-10 30 MG capsule Take 200 mg by mouth daily.    . cyanocobalamin (,VITAMIN B-12,) 1000 MCG/ML injection Inject 1 mL (1,000 mcg total) into the muscle every 30 (thirty) days. 1 mL 0  . metoprolol succinate (TOPROL-XL) 25 MG 24 hr tablet Take 0.5 tablets (12.5 mg total) by mouth daily. PLEASE CALL OFFICE TO MAKE APPOINTMENT FOR MORE REFILLS. 30 tablet 3  . nitroGLYCERIN (NITROSTAT) 0.4 MG SL tablet Place 1 tablet (0.4 mg total) under the tongue every 5 (five) minutes as needed. Up to 3 doses 25 tablet 11  . Omega-3 Fatty Acids (FISH OIL) 1200 MG CAPS Take 1,200 mg by mouth daily.    . Tamsulosin HCl (FLOMAX) 0.4 MG CAPS Take 0.4 mg by mouth daily after supper.     No current facility-administered medications for this visit.     Past Medical History  Diagnosis Date  . Coronary atherosclerosis of unspecified type of vessel, native or graft   . Personal history of colonic polyps     adenomatous  . Hypertrophy of prostate  without urinary obstruction and other lower urinary tract symptoms (LUTS)   . Hypertension   . Hyperlipidemia   . Diverticulosis of colon (without mention of hemorrhage)   . Degeneration of intervertebral disc, site unspecified   . Cluster headache   . Pernicious anemia   . Neurogenic dysphagia   . Osteoarthritis   . CHF (congestive heart failure) ([facility])   . Unspecified sleep apnea     does not have cpap-was not able to tolerate  . Prostate enlargement     urinary frequency, nocturia  . Thrombocytopenia, unspecified ([facility]) 09/01/2012  . Choledocholithiasis with obstruction 07/02/2013  . Abnormal LFTs   . Nephrolithiasis   . B12 deficiency   . Monocytosis 01/01/2014    Past Surgical History  Procedure Laterality Date  . Inguinal herniorrhapy  2003  . Uvulectomy  1993  . Knee arthroscopy      5 times  . Total knee arthroplasty  2003  . Rotator cuff repair Right 2006  . Biopsy prostate  2008    Benign  . Tonsillectomy    . Total hip arthroplasty Right 2011  . Hernia repair  2004    RIH  . Bone marrow biopsy  2011  . Nasal endoscopy  2012  . Cholecystectomy  11/24/2010    Procedure: LAPAROSCOPIC CHOLECYSTECTOMY WITH INTRAOPERATIVE CHOLANGIOGRAM;  Surgeon: Todd M Gerkin, MD;  Location: [facility];  Service:   General;  Laterality: N/A;  c-arm  . Ercp N/A 07/04/2013    Procedure: ENDOSCOPIC RETROGRADE CHOLANGIOPANCREATOGRAPHY (ERCP);  Surgeon: Carl E Gessner, MD;  Location: [facility];  Service: Endoscopy;  Laterality: N/A;  MAC if available    Social History   Social History  . Marital Status: Married    Spouse Name: N/A  . Number of Children: 2  . Years of Education: N/A   Occupational History  . retired    Social History Main Topics  . Smoking status: Former Smoker    Types: Pipe    Quit date: 01/18/1966  . Smokeless tobacco: Never Used  . Alcohol Use: No  . Drug Use: No  . Sexual Activity: Not on file   Other Topics Concern  . Not on file   Social History  Narrative   Desires DNR.    ROS: Significant arthralgias but no fevers or chills, productive cough, hemoptysis, dysphasia, odynophagia, melena, hematochezia, dysuria, hematuria, rash, seizure activity, orthopnea, PND, pedal edema, claudication. Remaining systems are negative.  Physical Exam: Well-developed well-nourished in no acute distress.  Skin is warm and dry.  HEENT is normal.  Neck is supple.  Chest is clear to auscultation with normal expansion.  Cardiovascular exam is regular rate and rhythm.  Abdominal exam nontender or distended. No masses palpated. Extremities show no edema. neuro grossly intact  ECG Sinus rhythm at a rate of 78. Left anterior fascicular block. Low voltage.     

## 2014-11-19 ENCOUNTER — Telehealth: Payer: Self-pay | Admitting: *Deleted

## 2014-11-19 ENCOUNTER — Ambulatory Visit (INDEPENDENT_AMBULATORY_CARE_PROVIDER_SITE_OTHER): Payer: Medicare Other | Admitting: Cardiology

## 2014-11-19 ENCOUNTER — Encounter: Payer: Self-pay | Admitting: Cardiology

## 2014-11-19 VITALS — BP 130/70 | HR 78 | Ht 60.0 in | Wt 148.9 lb

## 2014-11-19 DIAGNOSIS — I10 Essential (primary) hypertension: Secondary | ICD-10-CM

## 2014-11-19 DIAGNOSIS — I2510 Atherosclerotic heart disease of native coronary artery without angina pectoris: Secondary | ICD-10-CM | POA: Diagnosis not present

## 2014-11-19 DIAGNOSIS — Z01810 Encounter for preprocedural cardiovascular examination: Secondary | ICD-10-CM | POA: Insufficient documentation

## 2014-11-19 NOTE — Patient Instructions (Signed)
Your physician recommends that you schedule a follow-up appointment in: AS NEEDED  

## 2014-11-19 NOTE — Telephone Encounter (Signed)
Okay for shoulder surgery from cardiac standpoint given to pt to take to [facility] orthopedic

## 2014-11-19 NOTE — Assessment & Plan Note (Signed)
Continue aspirin. Declines statins. 

## 2014-11-19 NOTE — Assessment & Plan Note (Signed)
Declines statins. Continue diet.

## 2014-11-19 NOTE — Assessment & Plan Note (Signed)
Patient is scheduled for left shoulder replacement. His most recent nuclear study showed no ischemia. He has not had chest pain and has reasonable functional capacity. He does not require further cardiac testing preoperatively. Would continue present medications pre-and postoperatively.

## 2014-11-19 NOTE — Assessment & Plan Note (Signed)
Blood pressure controlled. Continue present medications. 

## 2014-11-22 ENCOUNTER — Telehealth: Payer: Self-pay | Admitting: *Deleted

## 2014-11-22 ENCOUNTER — Other Ambulatory Visit (INDEPENDENT_AMBULATORY_CARE_PROVIDER_SITE_OTHER): Payer: Medicare Other

## 2014-11-22 DIAGNOSIS — D696 Thrombocytopenia, unspecified: Secondary | ICD-10-CM | POA: Diagnosis not present

## 2014-11-22 LAB — CBC WITH DIFFERENTIAL/PLATELET
BASOS ABS: 0 10*3/uL (ref 0.0–0.1)
BASOS PCT: 0 %
Eosinophils Absolute: 0 10*3/uL (ref 0.0–0.7)
Eosinophils Relative: 1 %
HCT: 45.9 % (ref 39.0–52.0)
Hemoglobin: 15.5 g/dL (ref 13.0–17.0)
Lymphocytes Relative: 14 %
Lymphs Abs: 0.7 10*3/uL (ref 0.7–4.0)
MCH: 28.7 pg (ref 26.0–34.0)
MCHC: 33.8 g/dL (ref 30.0–36.0)
MCV: 85 fL (ref 78.0–100.0)
MONO ABS: 1.5 10*3/uL — AB (ref 0.1–1.0)
Monocytes Relative: 29 %
NEUTROS ABS: 2.8 10*3/uL (ref 1.7–7.7)
Neutrophils Relative %: 56 %
Platelets: 181 10*3/uL (ref 150–400)
RBC: 5.4 MIL/uL (ref 4.22–5.81)
RDW: 14.3 % (ref 11.5–15.5)
WBC: 5.1 10*3/uL (ref 4.0–10.5)

## 2014-11-22 NOTE — Telephone Encounter (Signed)
Pt called / informed platelet count back to normal @ 181 and if he decides to have his surgery,let us know so we can re-check his counts to be sure they are ok per Dr Beryle Beams. Pt voiced understanding.

## 2014-11-22 NOTE — Telephone Encounter (Signed)
-----   Message from Annia Belt, MD sent at 11/22/2014 12:11 PM EDT ----- Call pt: platelet count back to normal! If he decides to have surgery - will need to check counts again right before to make sure they are OK

## 2014-12-02 ENCOUNTER — Ambulatory Visit: Payer: Medicare Other | Admitting: Oncology

## 2014-12-03 ENCOUNTER — Other Ambulatory Visit: Payer: Self-pay | Admitting: Cardiology

## 2014-12-04 ENCOUNTER — Other Ambulatory Visit: Payer: Self-pay | Admitting: Cardiology

## 2014-12-04 NOTE — Telephone Encounter (Signed)
Rx request sent to pharmacy.  

## 2014-12-10 ENCOUNTER — Other Ambulatory Visit: Payer: Self-pay

## 2014-12-26 ENCOUNTER — Ambulatory Visit (INDEPENDENT_AMBULATORY_CARE_PROVIDER_SITE_OTHER): Payer: Medicare Other | Admitting: *Deleted

## 2014-12-26 DIAGNOSIS — L570 Actinic keratosis: Secondary | ICD-10-CM | POA: Diagnosis not present

## 2014-12-26 DIAGNOSIS — E538 Deficiency of other specified B group vitamins: Secondary | ICD-10-CM | POA: Diagnosis not present

## 2014-12-26 DIAGNOSIS — L820 Inflamed seborrheic keratosis: Secondary | ICD-10-CM | POA: Diagnosis not present

## 2014-12-26 DIAGNOSIS — L821 Other seborrheic keratosis: Secondary | ICD-10-CM | POA: Diagnosis not present

## 2014-12-26 DIAGNOSIS — Z85828 Personal history of other malignant neoplasm of skin: Secondary | ICD-10-CM | POA: Diagnosis not present

## 2014-12-26 MED ORDER — CYANOCOBALAMIN 1000 MCG/ML IJ SOLN
1000.0000 ug | Freq: Once | INTRAMUSCULAR | Status: AC
  Administered 2014-12-26: 1000 ug via INTRAMUSCULAR

## 2015-01-28 ENCOUNTER — Ambulatory Visit (INDEPENDENT_AMBULATORY_CARE_PROVIDER_SITE_OTHER): Payer: Medicare Other | Admitting: Internal Medicine

## 2015-01-28 ENCOUNTER — Encounter: Payer: Self-pay | Admitting: Internal Medicine

## 2015-01-28 VITALS — BP 114/66 | HR 81 | Temp 98.7°F | Wt 144.5 lb

## 2015-01-28 DIAGNOSIS — J069 Acute upper respiratory infection, unspecified: Secondary | ICD-10-CM | POA: Diagnosis not present

## 2015-01-28 MED ORDER — AZITHROMYCIN 250 MG PO TABS: ORAL_TABLET | ORAL | Status: DC

## 2015-01-28 NOTE — Patient Instructions (Signed)
Upper Respiratory Infection, Adult Most upper respiratory infections (URIs) are a viral infection of the air passages leading to the lungs. A URI affects the nose, throat, and upper air passages. The most common type of URI is nasopharyngitis and is typically referred to as "the common cold." URIs run their course and usually go away on their own. Most of the time, a URI does not require medical attention, but sometimes a bacterial infection in the upper airways can follow a viral infection. This is called a secondary infection. Sinus and middle ear infections are common types of secondary upper respiratory infections. Bacterial pneumonia can also complicate a URI. A URI can worsen asthma and chronic obstructive pulmonary disease (COPD). Sometimes, these complications can require emergency medical care and may be life threatening.  CAUSES Almost all URIs are caused by viruses. A virus is a type of germ and can spread from one person to another.  RISKS FACTORS You may be at risk for a URI if:   You smoke.   You have chronic heart or lung disease.  You have a weakened defense (immune) system.   You are very young or very old.   You have nasal allergies or asthma.  You work in crowded or poorly ventilated areas.  You work in health care facilities or schools. SIGNS AND SYMPTOMS  Symptoms typically develop 2-3 days after you come in contact with a cold virus. Most viral URIs last 7-10 days. However, viral URIs from the influenza virus (flu virus) can last 14-18 days and are typically more severe. Symptoms may include:   Runny or stuffy (congested) nose.   Sneezing.   Cough.   Sore throat.   Headache.   Fatigue.   Fever.   Loss of appetite.   Pain in your forehead, behind your eyes, and over your cheekbones (sinus pain).  Muscle aches.  DIAGNOSIS  Your health care provider may diagnose a URI by:  Physical exam.  Tests to check that your symptoms are not due to  another condition such as:  Strep throat.  Sinusitis.  Pneumonia.  Asthma. TREATMENT  A URI goes away on its own with time. It cannot be cured with medicines, but medicines may be prescribed or recommended to relieve symptoms. Medicines may help:  Reduce your fever.  Reduce your cough.  Relieve nasal congestion. HOME CARE INSTRUCTIONS   Take medicines only as directed by your health care provider.   Gargle warm saltwater or take cough drops to comfort your throat as directed by your health care provider.  Use a warm mist humidifier or inhale steam from a shower to increase air moisture. This may make it easier to breathe.  Drink enough fluid to keep your urine clear or pale yellow.   Eat soups and other clear broths and maintain good nutrition.   Rest as needed.   Return to work when your temperature has returned to normal or as your health care provider advises. You may need to stay home longer to avoid infecting others. You can also use a face mask and careful hand washing to prevent spread of the virus.  Increase the usage of your inhaler if you have asthma.   Do not use any tobacco products, including cigarettes, chewing tobacco, or electronic cigarettes. If you need help quitting, ask your health care provider. PREVENTION  The best way to protect yourself from getting a cold is to practice good hygiene.   Avoid oral or hand contact with people with cold   symptoms.   Wash your hands often if contact occurs.  There is no clear evidence that vitamin C, vitamin E, echinacea, or exercise reduces the chance of developing a cold. However, it is always recommended to get plenty of rest, exercise, and practice good nutrition.  SEEK MEDICAL CARE IF:   You are getting worse rather than better.   Your symptoms are not controlled by medicine.   You have chills.  You have worsening shortness of breath.  You have brown or red mucus.  You have yellow or brown nasal  discharge.  You have pain in your face, especially when you bend forward.  You have a fever.  You have swollen neck glands.  You have pain while swallowing.  You have white areas in the back of your throat. SEEK IMMEDIATE MEDICAL CARE IF:   You have severe or persistent:  Headache.  Ear pain.  Sinus pain.  Chest pain.  You have chronic lung disease and any of the following:  Wheezing.  Prolonged cough.  Coughing up blood.  A change in your usual mucus.  You have a stiff neck.  You have changes in your:  Vision.  Hearing.  Thinking.  Mood. MAKE SURE YOU:   Understand these instructions.  Will watch your condition.  Will get help right away if you are not doing well or get worse.   This information is not intended to replace advice given to you by your health care provider. Make sure you discuss any questions you have with your health care provider.   Document Released: 06/30/2000 Document Revised: 05/21/2014 Document Reviewed: 04/11/2013 Elsevier Interactive Patient Education 2016 [employer]  

## 2015-01-28 NOTE — Progress Notes (Signed)
Pre visit review using our clinic review tool, if applicable. No additional management support is needed unless otherwise documented below in the visit note. 

## 2015-01-28 NOTE — Progress Notes (Signed)
HPI  Pt presents to the clinic today with c/o ear fullness, nasal congestion, sore throat and cough. This started 1 month ago. He is blowing green mucous out of his nose. The cough is productive of green mucous. He denies fever, chills or body aches. He denies shortness of breath. He has tried Mucinex, Coricidin, and Hycodan with some relief. He has a history of allergies but denies breathing problems.   Review of Systems      Past Medical History  Diagnosis Date  . Coronary atherosclerosis of unspecified type of vessel, native or graft   . Personal history of colonic polyps     adenomatous  . Hypertrophy of prostate without urinary obstruction and other lower urinary tract symptoms (LUTS)   . Hypertension   . Hyperlipidemia   . Diverticulosis of colon (without mention of hemorrhage)   . Degeneration of intervertebral disc, site unspecified   . Cluster headache   . Pernicious anemia   . Neurogenic dysphagia   . Osteoarthritis   . CHF (congestive heart failure) ([place])   . Unspecified sleep apnea     does not have cpap-was not able to tolerate  . Prostate enlargement     urinary frequency, nocturia  . Thrombocytopenia, unspecified ([place]) 09/01/2012  . Choledocholithiasis with obstruction 07/02/2013  . Abnormal LFTs   . Nephrolithiasis   . B12 deficiency   . Monocytosis 01/01/2014    Family History  Problem Relation Age of Onset  . Colon cancer Father 26  . Peripheral vascular disease Mother     Social History   Social History  . Marital Status: Married    Spouse Name: N/A  . Number of Children: 2  . Years of Education: N/A   Occupational History  . retired    Social History Main Topics  . Smoking status: Former Smoker    Types: Pipe    Quit date: 01/18/1966  . Smokeless tobacco: Never Used  . Alcohol Use: No  . Drug Use: No  . Sexual Activity: Not on file   Other Topics Concern  . Not on file   Social History Narrative   Desires DNR.    Allergies   Allergen Reactions  . Penicillins Other (See Comments)    Whelps, passed out  . Sulfamethoxazole-Trimethoprim Rash  . Sulfonamide Derivatives Rash     Constitutional: Denies headache, fatigue, fever or abrupt weight changes.  HEENT:  Positive ear fullness, nasal congestion, sore throat. Denies eye redness, eye pain, pressure behind the eyes, facial pain, nasal congestion, ear pain, ringing in the ears, wax buildup, runny nose or bloody nose. Respiratory: Positive cough. Denies difficulty breathing or shortness of breath.  Cardiovascular: Denies chest pain, chest tightness, palpitations or swelling in the hands or feet.   No other specific complaints in a complete review of systems (except as listed in HPI above).  Objective:   BP 114/66 mmHg  Pulse 81  Temp(Src) 98.7 F (37.1 C) (Oral)  Wt 144 lb 8 oz (65.545 kg)  SpO2 98% Wt Readings from Last 3 Encounters:  01/28/15 144 lb 8 oz (65.545 kg)  11/19/14 148 lb 14.4 oz (67.541 kg)  11/01/14 149 lb 1.9 oz (67.64 kg)     General: Appears his stated age, in NAD. HEENT: Head: normal shape and size, no sinus tenderness noted; Eyes: sclera white, no icterus, conjunctiva pink; Ears: Tm's pink but intact, normal light reflex; Nose: mucosa pink and moist, septum midline; Throat/Mouth: + PND. Teeth present, mucosa pink and moist,  no exudate noted, no lesions or ulcerations noted.  Neck: No cervical lymphadenopathy.  Cardiovascular: Normal rate and rhythm. S1,S2 noted.  No murmur, rubs or gallops noted.  Pulmonary/Chest: Normal effort and positive vesicular breath sounds. No respiratory distress. No wheezes, rales or ronchi noted.      Assessment & Plan:   Upper Respiratory Infection:  Get some rest and drink plenty of water Do salt water gargles for the sore throat Try Nasocort for nasal congestion eRx for Azithromax x 5 days Continue Hycodan cough syrup  RTC as needed or if symptoms persist.

## 2015-01-31 ENCOUNTER — Ambulatory Visit: Payer: Self-pay | Admitting: Family Medicine

## 2015-01-31 ENCOUNTER — Ambulatory Visit (INDEPENDENT_AMBULATORY_CARE_PROVIDER_SITE_OTHER): Payer: Medicare Other | Admitting: Internal Medicine

## 2015-01-31 ENCOUNTER — Ambulatory Visit (INDEPENDENT_AMBULATORY_CARE_PROVIDER_SITE_OTHER)
Admission: RE | Admit: 2015-01-31 | Discharge: 2015-01-31 | Disposition: A | Discharge status: Home / Self Care | Payer: Medicare Other | Source: Ambulatory Visit | Attending: Internal Medicine | Admitting: Internal Medicine

## 2015-01-31 ENCOUNTER — Encounter: Payer: Self-pay | Admitting: Internal Medicine

## 2015-01-31 VITALS — BP 124/70 | HR 79 | Temp 98.6°F | Wt 147.0 lb

## 2015-01-31 DIAGNOSIS — R05 Cough: Secondary | ICD-10-CM

## 2015-01-31 DIAGNOSIS — R059 Cough, unspecified: Secondary | ICD-10-CM

## 2015-01-31 MED ORDER — HYDROCODONE-HOMATROPINE 5-1.5 MG/5ML PO SYRP: 5.0000 mL | ORAL_SOLUTION | Freq: Three times a day (TID) | ORAL | Status: DC | PRN

## 2015-01-31 NOTE — Progress Notes (Signed)
HPI  Pt presents to the clinic today to follow up upper respiratory infection. He was seen 1/10 for the same. At that time, he was c/o ear fullness, nasal congestion, sore throat and cough. This had started 1 month ago. He was blowing green mucous out of his nose. The cough was also productive of green mucous. He denied fever, chills or body aches. He denied shortness of breath. He had tried Mucinex, Coricidin, and Hycodan with some relief. He was given a Azithromycin and advised to take Flonase once a day. He reports he has not noticed much improvement. He has started hearing a rattling in his throat. He still denies shortness of breath or fevers. He has run out of they Hycodan which he thinks was really helping him. He is requesting a chest xray today.  Review of Systems      Past Medical History  Diagnosis Date  . Coronary atherosclerosis of unspecified type of vessel, native or graft   . Personal history of colonic polyps     adenomatous  . Hypertrophy of prostate without urinary obstruction and other lower urinary tract symptoms (LUTS)   . Hypertension   . Hyperlipidemia   . Diverticulosis of colon (without mention of hemorrhage)   . Degeneration of intervertebral disc, site unspecified   . Cluster headache   . Pernicious anemia   . Neurogenic dysphagia   . Osteoarthritis   . CHF (congestive heart failure) ([place])   . Unspecified sleep apnea     does not have cpap-was not able to tolerate  . Prostate enlargement     urinary frequency, nocturia  . Thrombocytopenia, unspecified ([place]) 09/01/2012  . Choledocholithiasis with obstruction 07/02/2013  . Abnormal LFTs   . Nephrolithiasis   . B12 deficiency   . Monocytosis 01/01/2014    Family History  Problem Relation Age of Onset  . Colon cancer Father 85  . Peripheral vascular disease Mother     Social History   Social History  . Marital Status: Married    Spouse Name: N/A  . Number of Children: 2  . Years of Education: N/A    Occupational History  . retired    Social History Main Topics  . Smoking status: Former Smoker    Types: Pipe    Quit date: 01/18/1966  . Smokeless tobacco: Never Used  . Alcohol Use: No  . Drug Use: No  . Sexual Activity: Not on file   Other Topics Concern  . Not on file   Social History Narrative   Desires DNR.    Allergies  Allergen Reactions  . Penicillins Other (See Comments)    Whelps, passed out  . Sulfamethoxazole-Trimethoprim Rash  . Sulfonamide Derivatives Rash     Constitutional: Denies headache, fatigue, fever or abrupt weight changes.  HEENT:  Positive ear fullness, nasal congestion, sore throat. Denies eye redness, eye pain, pressure behind the eyes, facial pain, nasal congestion, ear pain, ringing in the ears, wax buildup, runny nose or bloody nose. Respiratory: Positive cough. Denies difficulty breathing or shortness of breath.  Cardiovascular: Denies chest pain, chest tightness, palpitations or swelling in the hands or feet.   No other specific complaints in a complete review of systems (except as listed in HPI above).  Objective:   BP 124/70 mmHg  Pulse 79  Temp(Src) 98.6 F (37 C) (Oral)  Wt 147 lb (66.679 kg)  SpO2 97% Wt Readings from Last 3 Encounters:  01/31/15 147 lb (66.679 kg)  01/28/15 144 lb 8  oz (65.545 kg)  11/19/14 148 lb 14.4 oz (67.541 kg)     General: Appears his stated age, in NAD. HEENT: Head: normal shape and size, no sinus tenderness noted; Eyes: sclera white, no icterus, conjunctiva pink; Ears: Tm's pink but intact, normal light reflex; Nose: mucosa pink and moist, septum midline; Throat/Mouth: + PND. Teeth present, mucosa pink and moist, no exudate noted, no lesions or ulcerations noted.  Neck: No cervical lymphadenopathy.  Cardiovascular: Normal rate and rhythm. S1,S2 noted.  No murmur, rubs or gallops noted.  Pulmonary/Chest: Normal effort and positive vesicular breath sounds. No respiratory distress. No wheezes,  rales or ronchi noted.      Assessment & Plan:   Upper Respiratory Infection:  Advised him that his exam is better today than it was Chest is clear, but he is insisting on a chest xray- ordered Finish out Azithromax unless xray positive for infiltrate RX for Hycodan cough syrup  RTC as needed or if symptoms persist.

## 2015-01-31 NOTE — Patient Instructions (Signed)

## 2015-01-31 NOTE — Progress Notes (Signed)
Pre visit review using our clinic review tool, if applicable. No additional management support is needed unless otherwise documented below in the visit note. 

## 2015-02-01 ENCOUNTER — Encounter: Payer: Self-pay | Admitting: Internal Medicine

## 2015-02-27 ENCOUNTER — Ambulatory Visit (INDEPENDENT_AMBULATORY_CARE_PROVIDER_SITE_OTHER): Payer: Medicare Other | Admitting: *Deleted

## 2015-02-27 DIAGNOSIS — E538 Deficiency of other specified B group vitamins: Secondary | ICD-10-CM | POA: Diagnosis not present

## 2015-02-27 MED ORDER — CYANOCOBALAMIN 1000 MCG/ML IJ SOLN
1000.0000 ug | Freq: Once | INTRAMUSCULAR | Status: AC
  Administered 2015-02-27: 1000 ug via INTRAMUSCULAR

## 2015-03-12 ENCOUNTER — Encounter: Payer: Self-pay | Admitting: Family Medicine

## 2015-03-12 ENCOUNTER — Encounter: Payer: Self-pay | Admitting: *Deleted

## 2015-03-12 ENCOUNTER — Ambulatory Visit (INDEPENDENT_AMBULATORY_CARE_PROVIDER_SITE_OTHER): Payer: Medicare Other | Admitting: Family Medicine

## 2015-03-12 VITALS — BP 144/72 | HR 86 | Temp 97.5°F | Ht 60.25 in | Wt 146.2 lb

## 2015-03-12 DIAGNOSIS — D696 Thrombocytopenia, unspecified: Secondary | ICD-10-CM

## 2015-03-12 DIAGNOSIS — Z0000 Encounter for general adult medical examination without abnormal findings: Secondary | ICD-10-CM

## 2015-03-12 DIAGNOSIS — E785 Hyperlipidemia, unspecified: Secondary | ICD-10-CM | POA: Diagnosis not present

## 2015-03-12 DIAGNOSIS — F329 Major depressive disorder, single episode, unspecified: Secondary | ICD-10-CM | POA: Insufficient documentation

## 2015-03-12 DIAGNOSIS — D510 Vitamin B12 deficiency anemia due to intrinsic factor deficiency: Secondary | ICD-10-CM

## 2015-03-12 DIAGNOSIS — F32A Depression, unspecified: Secondary | ICD-10-CM

## 2015-03-12 DIAGNOSIS — Z125 Encounter for screening for malignant neoplasm of prostate: Secondary | ICD-10-CM | POA: Diagnosis not present

## 2015-03-12 DIAGNOSIS — Z66 Do not resuscitate: Secondary | ICD-10-CM

## 2015-03-12 DIAGNOSIS — I10 Essential (primary) hypertension: Secondary | ICD-10-CM

## 2015-03-12 LAB — COMPREHENSIVE METABOLIC PANEL
ALBUMIN: 4.3 g/dL (ref 3.5–5.2)
ALT: 12 U/L (ref 0–53)
AST: 16 U/L (ref 0–37)
Alkaline Phosphatase: 82 U/L (ref 39–117)
BUN: 10 mg/dL (ref 6–23)
CHLORIDE: 105 meq/L (ref 96–112)
CO2: 29 meq/L (ref 19–32)
CREATININE: 0.95 mg/dL (ref 0.40–1.50)
Calcium: 9.2 mg/dL (ref 8.4–10.5)
GFR: 81.19 mL/min (ref >60.00)
GLUCOSE: 92 mg/dL (ref 70–99)
Potassium: 4.2 mEq/L (ref 3.5–5.1)
Sodium: 142 mEq/L (ref 135–145)
TOTAL PROTEIN: 5.9 g/dL — AB (ref 6.0–8.3)
Total Bilirubin: 0.6 mg/dL (ref 0.2–1.2)

## 2015-03-12 LAB — CBC WITH DIFFERENTIAL/PLATELET
BASOS ABS: 0 10*3/uL (ref 0.0–0.1)
Basophils Relative: 0.3 % (ref 0.0–3.0)
EOS PCT: 0.6 % (ref 0.0–5.0)
Eosinophils Absolute: 0 10*3/uL (ref 0.0–0.7)
HEMATOCRIT: 45.9 % (ref 39.0–52.0)
Hemoglobin: 15.6 g/dL (ref 13.0–17.0)
LYMPHS ABS: 0.9 10*3/uL (ref 0.7–4.0)
LYMPHS PCT: 17.1 % (ref 12.0–46.0)
MCHC: 34 g/dL (ref 30.0–36.0)
MCV: 83.7 fl (ref 78.0–100.0)
Monocytes Absolute: 1.6 10*3/uL — ABNORMAL HIGH (ref 0.1–1.0)
NEUTROS ABS: 2.8 10*3/uL (ref 1.4–7.7)
Neutrophils Relative %: 52.3 % (ref 43.0–77.0)
Platelets: 161 10*3/uL (ref 150.0–400.0)
RBC: 5.48 Mil/uL (ref 4.22–5.81)
RDW: 14.6 % (ref 11.5–15.5)
WBC: 5.4 10*3/uL (ref 4.0–10.5)

## 2015-03-12 LAB — LIPID PANEL
CHOL/HDL RATIO: 4
Cholesterol: 150 mg/dL (ref 0–200)
HDL: 40.3 mg/dL (ref >39.00)
LDL CALC: 84 mg/dL (ref 0–99)
NonHDL: 109.38
Triglycerides: 127 mg/dL (ref 0.0–149.0)
VLDL: 25.4 mg/dL (ref 0.0–40.0)

## 2015-03-12 LAB — VITAMIN B12: VITAMIN B 12: 578 pg/mL (ref 211–911)

## 2015-03-12 LAB — PSA, MEDICARE: PSA: 6.07 ng/mL — AB (ref 0.10–4.00)

## 2015-03-12 NOTE — Assessment & Plan Note (Signed)
The patients weight, height, BMI and visual acuity have been recorded in the chart.  Cognitive function assessed.   I have made referrals, counseling and provided education to the patient based review of the above and I have provided the pt with a written personalized care plan for preventive services.  Labs today.  Orders Placed This Encounter  Procedures  . CBC with Differential/Platelet  . Comprehensive metabolic panel  . Lipid panel  . PSA, Medicare

## 2015-03-12 NOTE — Assessment & Plan Note (Signed)
Followed by oncology.  Due for CBC today.

## 2015-03-12 NOTE — Assessment & Plan Note (Signed)
Monthly B12 injections.

## 2015-03-12 NOTE — Patient Instructions (Addendum)
Good to see you. Please schedule an eye exam at your convenience.  We will call you with your lab results from today.

## 2015-03-12 NOTE — Assessment & Plan Note (Signed)
  Declines statins.  Continue diet.  Recheck lipid panel today.

## 2015-03-12 NOTE — Progress Notes (Signed)
Subjective:   Patient ID: [name], male    DOB: 1933/02/15, 82 y.o.   MRN: [id number]  [name] is a pleasant 82 y.o. year old male who presents to clinic today annual medicare wellness visit, CPX and follow up of chronic medical conditions  Annual Exam  on 03/12/2015  HPI: I have personally reviewed the Medicare Annual Wellness questionnaire and have noted 1. The patient's medical and social history 2. Their use of alcohol, tobacco or illicit drugs 3. Their current medications and supplements 4. The patient's functional ability including ADL's, fall risks, home safety risks and hearing or visual             impairment. 5. Diet and physical activities 6. Evidence for depression or mood disorders  End of life wishes discussed and updated in Social History.  The roster of all physicians providing medical care to patient - is listed in the Snapshot section of the chart.  Colonoscopy May 2011 Td 05/05/11 Zoster 03/13/07 Pneumovax 01/11/07 Prevnar 13 09/06/13 Influenza vaccine 10/24/14 Last eye exam 01/2013  OA- may need left shoulder replacement.  Does admit to feeling sad since his wife died.  He denies SI or HI.  He goes to church and reads the bible.  Has refused rxs and therapy.  Wt Readings from Last 3 Encounters:  03/12/15 146 lb 4 oz (66.339 kg)  01/31/15 147 lb (66.679 kg)  01/28/15 144 lb 8 oz (65.545 kg)   Saw Dr. Gaynelle Arabian last month- DRE and PSA done. History of elevated PSAs.  Previous biopsies have been negative x 2.   . Lab Results  Component Value Date   ALT 14 11/01/2014   AST 17 11/01/2014   ALKPHOS 93 11/01/2014   BILITOT 0.6 11/01/2014   Thrombocytopenia with h/o pernicious anemia- followed by Dr. Beryle Beams.  Was last seen on 07/01/14.  Note reviewed.  Advised continued observation and monthly B12 injections. Platelets improved.  Lab Results  Component Value Date   WBC 5.1 11/22/2014   HGB 15.5 11/22/2014   HCT 45.9 11/22/2014   MCV  85.0 11/22/2014   PLT 181 11/22/2014    Lab Results  Component Value Date   CHOL 156 09/06/2013   HDL 41.70 09/06/2013   LDLCALC 85 09/06/2013   TRIG 149.0 09/06/2013   CHOLHDL 4 09/06/2013    Current Outpatient Prescriptions on File Prior to Visit  Medication Sig Dispense Refill  . calcium carbonate (TUMS - DOSED IN MG ELEMENTAL CALCIUM) 500 MG chewable tablet Chew 2 tablets by mouth once.    . clobetasol cream (TEMOVATE) 4.32 % Apply 1 application topically 2 (two) times daily. For whelps/itching    . co-enzyme Q-10 30 MG capsule Take 200 mg by mouth daily.    . cyanocobalamin (,VITAMIN B-12,) 1000 MCG/ML injection Inject 1 mL (1,000 mcg total) into the muscle every 30 (thirty) days. 1 mL 0  . metoprolol succinate (TOPROL-XL) 25 MG 24 hr tablet Take 1 tablet (25 mg total) by mouth daily. 30 tablet 6  . nitroGLYCERIN (NITROSTAT) 0.4 MG SL tablet Place 1 tablet (0.4 mg total) under the tongue every 5 (five) minutes as needed. Up to 3 doses 25 tablet 11  . Omega-3 Fatty Acids (FISH OIL) 1200 MG CAPS Take 1,200 mg by mouth daily.    . Tamsulosin HCl (FLOMAX) 0.4 MG CAPS Take 0.4 mg by mouth daily after supper.     No current facility-administered medications on file prior to visit.  Allergies  Allergen Reactions  . Penicillins Other (See Comments)    Whelps, passed out  . Sulfamethoxazole-Trimethoprim Rash  . Sulfonamide Derivatives Rash    Past Medical History  Diagnosis Date  . Coronary atherosclerosis of unspecified type of vessel, native or graft   . Personal history of colonic polyps     adenomatous  . Hypertrophy of prostate without urinary obstruction and other lower urinary tract symptoms (LUTS)   . Hypertension   . Hyperlipidemia   . Diverticulosis of colon (without mention of hemorrhage)   . Degeneration of intervertebral disc, site unspecified   . Cluster headache   . Pernicious anemia   . Neurogenic dysphagia   . Osteoarthritis   . CHF (congestive heart  failure) ([place])   . Unspecified sleep apnea     does not have cpap-was not able to tolerate  . Prostate enlargement     urinary frequency, nocturia  . Thrombocytopenia, unspecified ([place]) 09/01/2012  . Choledocholithiasis with obstruction 07/02/2013  . Abnormal LFTs   . Nephrolithiasis   . B12 deficiency   . Monocytosis 01/01/2014    Past Surgical History  Procedure Laterality Date  . Inguinal herniorrhapy  2003  . Uvulectomy  1993  . Knee arthroscopy      5 times  . Total knee arthroplasty  2003  . Rotator cuff repair Right 2006  . Biopsy prostate  2008    Benign  . Tonsillectomy    . Total hip arthroplasty Right 2011  . Hernia repair  2004    RIH  . Bone marrow biopsy  2011  . Nasal endoscopy  2012  . Cholecystectomy  11/24/2010    Procedure: LAPAROSCOPIC CHOLECYSTECTOMY WITH INTRAOPERATIVE CHOLANGIOGRAM;  Surgeon: Earnstine Regal, MD;  Location: [facility];  Service: General;  Laterality: N/A;  c-arm  . Ercp N/A 07/04/2013    Procedure: ENDOSCOPIC RETROGRADE CHOLANGIOPANCREATOGRAPHY (ERCP);  Surgeon: Gatha Mayer, MD;  Location: [facility];  Service: Endoscopy;  Laterality: N/A;  MAC if available    Family History  Problem Relation Age of Onset  . Colon cancer Father 28  . Peripheral vascular disease Mother     Social History   Social History  . Marital Status: Married    Spouse Name: N/A  . Number of Children: 2  . Years of Education: N/A   Occupational History  . retired    Social History Main Topics  . Smoking status: Former Smoker    Types: Pipe    Quit date: 01/18/1966  . Smokeless tobacco: Never Used  . Alcohol Use: No  . Drug Use: No  . Sexual Activity: Not on file   Other Topics Concern  . Not on file   Social History Narrative   DNR   Widowed   2 daughters leave near by   The PMH, PSH, Social History, Family History, Medications, and allergies have been reviewed in [facility], and have been updated if relevant.    Review of Systems    Constitutional: Positive for fatigue.  HENT: Negative.   Eyes: Negative.   Respiratory: Negative.   Cardiovascular: Negative.   Gastrointestinal: Negative.   Endocrine: Negative.   Genitourinary: Negative.   Musculoskeletal: Negative.   Skin: Negative.   Allergic/Immunologic: Negative.   Neurological: Negative.   Hematological: Negative.   Psychiatric/Behavioral: Negative.   All other systems reviewed and are negative.     Objective:    BP 144/72 mmHg  Pulse 86  Temp(Src) 97.5 F (36.4 C) (Oral)  Ht 5' 0.25" (1.53 m)  Wt 146 lb 4 oz (66.339 kg)  BMI 28.34 kg/m2  SpO2 98%   Physical Exam  General:  pleasant male in NAD Eyes:  PERRL Ears:  External ear exam shows no significant lesions or deformities.  Otoscopic examination reveals clear canals, tympanic membranes are intact bilaterally without bulging, retraction, inflammation or discharge. Hearing is grossly normal bilaterally. Nose:  External nasal examination shows no deformity or inflammation. Nasal mucosa are pink and moist without lesions or exudates. Mouth:  Oral mucosa and oropharynx without lesions or exudates.  Teeth in good repair. Neck:  no carotid bruit or thyromegaly no cervical or supraclavicular lymphadenopathy  Lungs:  Normal respiratory effort, chest expands symmetrically. Lungs are clear to auscultation, no crackles or wheezes. Heart:  Normal rate and regular rhythm. S1 and S2 normal without gallop, murmur, click, rub or other extra sounds. Abdomen:  Bowel sounds positive,abdomen soft and non-tender without masses, organomegaly or hernias noted. Pulses:  R and L posterior tibial pulses are full and equal bilaterally  Extremities:  no edema         Assessment & Plan:   Medicare annual wellness visit, subsequent  Thrombocytopenia ([place])  DNR (do not resuscitate)  ANEMIA, PERNICIOUS  HLD (hyperlipidemia)  Essential hypertension No Follow-up on file.

## 2015-03-12 NOTE — Progress Notes (Signed)
Pre visit review using our clinic review tool, if applicable. No additional management support is needed unless otherwise documented below in the visit note. 

## 2015-03-12 NOTE — Assessment & Plan Note (Signed)
Reasonable control. No changes made to rxs today. 

## 2015-03-12 NOTE — Assessment & Plan Note (Signed)
Reviewed preventive care protocols, scheduled due services, and updated immunizations Discussed nutrition, exercise, diet, and healthy lifestyle.  

## 2015-03-12 NOTE — Assessment & Plan Note (Signed)
Prolonged grief.  He declines any treatment. Denies SI or HI.

## 2015-03-25 ENCOUNTER — Ambulatory Visit (INDEPENDENT_AMBULATORY_CARE_PROVIDER_SITE_OTHER): Payer: Medicare Other | Admitting: Internal Medicine

## 2015-03-25 ENCOUNTER — Encounter: Payer: Self-pay | Admitting: Internal Medicine

## 2015-03-25 VITALS — BP 128/70 | HR 78 | Temp 98.9°F | Resp 18 | Wt 146.2 lb

## 2015-03-25 DIAGNOSIS — R05 Cough: Secondary | ICD-10-CM | POA: Insufficient documentation

## 2015-03-25 DIAGNOSIS — R059 Cough, unspecified: Secondary | ICD-10-CM | POA: Insufficient documentation

## 2015-03-25 MED ORDER — DOXYCYCLINE HYCLATE 100 MG PO TABS: 100.0000 mg | ORAL_TABLET | Freq: Two times a day (BID) | ORAL | Status: DC

## 2015-03-25 NOTE — Progress Notes (Signed)
Pre visit review using our clinic review tool, if applicable. No additional management support is needed unless otherwise documented below in the visit note. 

## 2015-03-25 NOTE — Progress Notes (Signed)
Subjective:    Patient ID: [name], male    DOB: 02-14-1933, 82 y.o.   MRN: [id number]  HPI Here due to persistent cough  Started in early January  Better for a few days after the antibiotic--then runny nose, sore throat, sneezing and coughing recurred z-pak at the beginning  Using mucinex mucus control--- may help some Trying neti pot with saline--- sinus flush did help Lost hearing and taste  Some left frontal headache No fever Some nasal drainage--but constant PND Hearing off---ear pressure No SOB  Chronic sinus problems since teenager Uses the flonase  Current Outpatient Prescriptions on File Prior to Visit  Medication Sig Dispense Refill  . calcium carbonate (TUMS - DOSED IN MG ELEMENTAL CALCIUM) 500 MG chewable tablet Chew 2 tablets by mouth as needed.     . clobetasol cream (TEMOVATE) 2.35 % Apply 1 application topically 2 (two) times daily. For whelps/itching    . co-enzyme Q-10 30 MG capsule Take 200 mg by mouth daily.    . cyanocobalamin (,VITAMIN B-12,) 1000 MCG/ML injection Inject 1 mL (1,000 mcg total) into the muscle every 30 (thirty) days. 1 mL 0  . metoprolol succinate (TOPROL-XL) 25 MG 24 hr tablet Take 1 tablet (25 mg total) by mouth daily. 30 tablet 6  . nitroGLYCERIN (NITROSTAT) 0.4 MG SL tablet Place 1 tablet (0.4 mg total) under the tongue every 5 (five) minutes as needed. Up to 3 doses 25 tablet 11  . Omega-3 Fatty Acids (FISH OIL) 1200 MG CAPS Take 1,200 mg by mouth daily.    . Tamsulosin HCl (FLOMAX) 0.4 MG CAPS Take 0.4 mg by mouth daily after supper.     No current facility-administered medications on file prior to visit.    Allergies  Allergen Reactions  . Penicillins Other (See Comments)    Whelps, passed out  . Sulfamethoxazole-Trimethoprim Rash  . Sulfonamide Derivatives Rash    Past Medical History  Diagnosis Date  . Coronary atherosclerosis of unspecified type of vessel, native or graft   . Personal history of colonic polyps      adenomatous  . Hypertrophy of prostate without urinary obstruction and other lower urinary tract symptoms (LUTS)   . Hypertension   . Hyperlipidemia   . Diverticulosis of colon (without mention of hemorrhage)   . Degeneration of intervertebral disc, site unspecified   . Cluster headache   . Pernicious anemia   . Neurogenic dysphagia   . Osteoarthritis   . CHF (congestive heart failure) ([place])   . Unspecified sleep apnea     does not have cpap-was not able to tolerate  . Prostate enlargement     urinary frequency, nocturia  . Thrombocytopenia, unspecified ([place]) 09/01/2012  . Choledocholithiasis with obstruction 07/02/2013  . Abnormal LFTs   . Nephrolithiasis   . B12 deficiency   . Monocytosis 01/01/2014    Past Surgical History  Procedure Laterality Date  . Inguinal herniorrhapy  2003  . Uvulectomy  1993  . Knee arthroscopy      5 times  . Total knee arthroplasty  2003  . Rotator cuff repair Right 2006  . Biopsy prostate  2008    Benign  . Tonsillectomy    . Total hip arthroplasty Right 2011  . Hernia repair  2004    RIH  . Bone marrow biopsy  2011  . Nasal endoscopy  2012  . Cholecystectomy  11/24/2010    Procedure: LAPAROSCOPIC CHOLECYSTECTOMY WITH INTRAOPERATIVE CHOLANGIOGRAM;  Surgeon: Earnstine Regal, MD;  Location: [facility];  Service: General;  Laterality: N/A;  c-arm  . Ercp N/A 07/04/2013    Procedure: ENDOSCOPIC RETROGRADE CHOLANGIOPANCREATOGRAPHY (ERCP);  Surgeon: Gatha Mayer, MD;  Location: [facility];  Service: Endoscopy;  Laterality: N/A;  MAC if available    Family History  Problem Relation Age of Onset  . Colon cancer Father 48  . Peripheral vascular disease Mother     Social History   Social History  . Marital Status: Married    Spouse Name: N/A  . Number of Children: 2  . Years of Education: N/A   Occupational History  . retired    Social History Main Topics  . Smoking status: Former Smoker    Types: Pipe    Quit date: 01/18/1966  .  Smokeless tobacco: Never Used  . Alcohol Use: No  . Drug Use: No  . Sexual Activity: Not on file   Other Topics Concern  . Not on file   Social History Narrative   DNR   Widowed   2 daughters leave near by   Review of Systems Using narcotic cough med--helps him sleep at night No rash--but does have hard spot on right calf (has seen derm for it) No vomiting or diarrhea Appetite is off some--but eating okay    Objective:   Physical Exam  Constitutional: He appears well-developed and well-nourished. No distress.  HENT:  Mouth/Throat: Oropharynx is clear and moist. No oropharyngeal exudate.  No sinus tenderness Moderate nasal inflammation TMs normal   Neck: Normal range of motion. Neck supple.  Pulmonary/Chest: Effort normal and breath sounds normal. No respiratory distress. He has no wheezes. He has no rales.  Lymphadenopathy:    He has no cervical adenopathy.          Assessment & Plan:

## 2015-03-25 NOTE — Assessment & Plan Note (Signed)
Persisting for 2 months Seems infectious--but not in chest, probably sinus PCN allergy (uncertain)--so will try doxy Continue supportive care

## 2015-04-22 ENCOUNTER — Ambulatory Visit (INDEPENDENT_AMBULATORY_CARE_PROVIDER_SITE_OTHER): Payer: Medicare Other | Admitting: *Deleted

## 2015-04-22 DIAGNOSIS — E538 Deficiency of other specified B group vitamins: Secondary | ICD-10-CM | POA: Diagnosis not present

## 2015-04-22 MED ORDER — CYANOCOBALAMIN 1000 MCG/ML IJ SOLN
1000.0000 ug | Freq: Once | INTRAMUSCULAR | Status: AC
  Administered 2015-04-22: 1000 ug via INTRAMUSCULAR

## 2015-05-27 ENCOUNTER — Ambulatory Visit (INDEPENDENT_AMBULATORY_CARE_PROVIDER_SITE_OTHER): Payer: Medicare Other | Admitting: *Deleted

## 2015-05-27 DIAGNOSIS — E538 Deficiency of other specified B group vitamins: Secondary | ICD-10-CM

## 2015-05-27 MED ORDER — CYANOCOBALAMIN 1000 MCG/ML IJ SOLN
1000.0000 ug | Freq: Once | INTRAMUSCULAR | Status: AC
  Administered 2015-05-27: 1000 ug via INTRAMUSCULAR

## 2015-06-25 DIAGNOSIS — H02831 Dermatochalasis of right upper eyelid: Secondary | ICD-10-CM | POA: Diagnosis not present

## 2015-06-25 DIAGNOSIS — H02834 Dermatochalasis of left upper eyelid: Secondary | ICD-10-CM | POA: Diagnosis not present

## 2015-06-25 DIAGNOSIS — H2513 Age-related nuclear cataract, bilateral: Secondary | ICD-10-CM | POA: Diagnosis not present

## 2015-07-10 DIAGNOSIS — L814 Other melanin hyperpigmentation: Secondary | ICD-10-CM | POA: Diagnosis not present

## 2015-07-10 DIAGNOSIS — L570 Actinic keratosis: Secondary | ICD-10-CM | POA: Diagnosis not present

## 2015-07-10 DIAGNOSIS — L219 Seborrheic dermatitis, unspecified: Secondary | ICD-10-CM | POA: Diagnosis not present

## 2015-07-10 DIAGNOSIS — Z85828 Personal history of other malignant neoplasm of skin: Secondary | ICD-10-CM | POA: Diagnosis not present

## 2015-07-10 DIAGNOSIS — D225 Melanocytic nevi of trunk: Secondary | ICD-10-CM | POA: Diagnosis not present

## 2015-07-16 DIAGNOSIS — H2511 Age-related nuclear cataract, right eye: Secondary | ICD-10-CM | POA: Diagnosis not present

## 2015-07-16 DIAGNOSIS — H2513 Age-related nuclear cataract, bilateral: Secondary | ICD-10-CM | POA: Diagnosis not present

## 2015-08-19 HISTORY — PX: CATARACT EXTRACTION W/ INTRAOCULAR LENS  IMPLANT, BILATERAL: SHX1307

## 2015-08-20 ENCOUNTER — Ambulatory Visit (INDEPENDENT_AMBULATORY_CARE_PROVIDER_SITE_OTHER): Payer: Medicare Other | Admitting: *Deleted

## 2015-08-20 DIAGNOSIS — E538 Deficiency of other specified B group vitamins: Secondary | ICD-10-CM | POA: Diagnosis not present

## 2015-08-20 MED ORDER — CYANOCOBALAMIN 1000 MCG/ML IJ SOLN
1000.0000 ug | Freq: Once | INTRAMUSCULAR | Status: AC
  Administered 2015-08-20: 1000 ug via INTRAMUSCULAR

## 2015-08-26 DIAGNOSIS — H2511 Age-related nuclear cataract, right eye: Secondary | ICD-10-CM | POA: Diagnosis not present

## 2015-08-27 DIAGNOSIS — H2512 Age-related nuclear cataract, left eye: Secondary | ICD-10-CM | POA: Diagnosis not present

## 2015-09-02 DIAGNOSIS — H2512 Age-related nuclear cataract, left eye: Secondary | ICD-10-CM | POA: Diagnosis not present

## 2015-10-08 DIAGNOSIS — J069 Acute upper respiratory infection, unspecified: Secondary | ICD-10-CM | POA: Diagnosis not present

## 2015-10-08 DIAGNOSIS — R0982 Postnasal drip: Secondary | ICD-10-CM | POA: Diagnosis not present

## 2015-10-10 ENCOUNTER — Encounter (HOSPITAL_COMMUNITY): Payer: Self-pay | Admitting: Emergency Medicine

## 2015-10-10 ENCOUNTER — Emergency Department (HOSPITAL_COMMUNITY): Payer: Medicare Other

## 2015-10-10 ENCOUNTER — Inpatient Hospital Stay (HOSPITAL_COMMUNITY)
Admission: EM | Admit: 2015-10-10 | Discharge: 2015-10-14 | DRG: 871 | Disposition: A | Discharge status: Home / Self Care | Payer: Medicare Other | Attending: Internal Medicine | Admitting: Internal Medicine

## 2015-10-10 DIAGNOSIS — Z96641 Presence of right artificial hip joint: Secondary | ICD-10-CM | POA: Diagnosis present

## 2015-10-10 DIAGNOSIS — N400 Enlarged prostate without lower urinary tract symptoms: Secondary | ICD-10-CM | POA: Diagnosis not present

## 2015-10-10 DIAGNOSIS — Z96651 Presence of right artificial knee joint: Secondary | ICD-10-CM | POA: Diagnosis present

## 2015-10-10 DIAGNOSIS — K859 Acute pancreatitis without necrosis or infection, unspecified: Secondary | ICD-10-CM | POA: Diagnosis not present

## 2015-10-10 DIAGNOSIS — K8051 Calculus of bile duct without cholangitis or cholecystitis with obstruction: Secondary | ICD-10-CM | POA: Diagnosis present

## 2015-10-10 DIAGNOSIS — I444 Left anterior fascicular block: Secondary | ICD-10-CM | POA: Diagnosis not present

## 2015-10-10 DIAGNOSIS — R109 Unspecified abdominal pain: Secondary | ICD-10-CM

## 2015-10-10 DIAGNOSIS — Z79899 Other long term (current) drug therapy: Secondary | ICD-10-CM

## 2015-10-10 DIAGNOSIS — Z86010 Personal history of colonic polyps: Secondary | ICD-10-CM

## 2015-10-10 DIAGNOSIS — G4733 Obstructive sleep apnea (adult) (pediatric): Secondary | ICD-10-CM | POA: Diagnosis not present

## 2015-10-10 DIAGNOSIS — I272 Other secondary pulmonary hypertension: Secondary | ICD-10-CM | POA: Diagnosis present

## 2015-10-10 DIAGNOSIS — E785 Hyperlipidemia, unspecified: Secondary | ICD-10-CM | POA: Diagnosis not present

## 2015-10-10 DIAGNOSIS — D510 Vitamin B12 deficiency anemia due to intrinsic factor deficiency: Secondary | ICD-10-CM | POA: Diagnosis not present

## 2015-10-10 DIAGNOSIS — K8050 Calculus of bile duct without cholangitis or cholecystitis without obstruction: Secondary | ICD-10-CM

## 2015-10-10 DIAGNOSIS — Z8249 Family history of ischemic heart disease and other diseases of the circulatory system: Secondary | ICD-10-CM

## 2015-10-10 DIAGNOSIS — N390 Urinary tract infection, site not specified: Secondary | ICD-10-CM | POA: Diagnosis not present

## 2015-10-10 DIAGNOSIS — K8043 Calculus of bile duct with acute cholecystitis with obstruction: Secondary | ICD-10-CM | POA: Diagnosis not present

## 2015-10-10 DIAGNOSIS — A419 Sepsis, unspecified organism: Secondary | ICD-10-CM | POA: Diagnosis not present

## 2015-10-10 DIAGNOSIS — R17 Unspecified jaundice: Secondary | ICD-10-CM

## 2015-10-10 DIAGNOSIS — R059 Cough, unspecified: Secondary | ICD-10-CM | POA: Diagnosis present

## 2015-10-10 DIAGNOSIS — Z880 Allergy status to penicillin: Secondary | ICD-10-CM

## 2015-10-10 DIAGNOSIS — R05 Cough: Secondary | ICD-10-CM | POA: Diagnosis not present

## 2015-10-10 DIAGNOSIS — K8031 Calculus of bile duct with cholangitis, unspecified, with obstruction: Secondary | ICD-10-CM | POA: Diagnosis present

## 2015-10-10 DIAGNOSIS — R7989 Other specified abnormal findings of blood chemistry: Secondary | ICD-10-CM | POA: Diagnosis not present

## 2015-10-10 DIAGNOSIS — J40 Bronchitis, not specified as acute or chronic: Secondary | ICD-10-CM | POA: Diagnosis not present

## 2015-10-10 DIAGNOSIS — R1084 Generalized abdominal pain: Secondary | ICD-10-CM | POA: Diagnosis not present

## 2015-10-10 DIAGNOSIS — D696 Thrombocytopenia, unspecified: Secondary | ICD-10-CM | POA: Diagnosis present

## 2015-10-10 DIAGNOSIS — Z800 Family history of malignant neoplasm of digestive organs: Secondary | ICD-10-CM

## 2015-10-10 DIAGNOSIS — I2510 Atherosclerotic heart disease of native coronary artery without angina pectoris: Secondary | ICD-10-CM | POA: Diagnosis present

## 2015-10-10 DIAGNOSIS — R1033 Periumbilical pain: Secondary | ICD-10-CM | POA: Diagnosis not present

## 2015-10-10 DIAGNOSIS — I509 Heart failure, unspecified: Secondary | ICD-10-CM | POA: Diagnosis not present

## 2015-10-10 DIAGNOSIS — Z87891 Personal history of nicotine dependence: Secondary | ICD-10-CM | POA: Diagnosis not present

## 2015-10-10 DIAGNOSIS — R112 Nausea with vomiting, unspecified: Secondary | ICD-10-CM | POA: Diagnosis present

## 2015-10-10 DIAGNOSIS — I110 Hypertensive heart disease with heart failure: Secondary | ICD-10-CM | POA: Diagnosis present

## 2015-10-10 DIAGNOSIS — R945 Abnormal results of liver function studies: Secondary | ICD-10-CM | POA: Diagnosis present

## 2015-10-10 DIAGNOSIS — Z66 Do not resuscitate: Secondary | ICD-10-CM | POA: Diagnosis present

## 2015-10-10 DIAGNOSIS — K5790 Diverticulosis of intestine, part unspecified, without perforation or abscess without bleeding: Secondary | ICD-10-CM | POA: Diagnosis not present

## 2015-10-10 DIAGNOSIS — J069 Acute upper respiratory infection, unspecified: Secondary | ICD-10-CM | POA: Diagnosis present

## 2015-10-10 DIAGNOSIS — Z9049 Acquired absence of other specified parts of digestive tract: Secondary | ICD-10-CM

## 2015-10-10 DIAGNOSIS — Z87442 Personal history of urinary calculi: Secondary | ICD-10-CM | POA: Diagnosis not present

## 2015-10-10 DIAGNOSIS — Z882 Allergy status to sulfonamides status: Secondary | ICD-10-CM

## 2015-10-10 DIAGNOSIS — R262 Difficulty in walking, not elsewhere classified: Secondary | ICD-10-CM

## 2015-10-10 DIAGNOSIS — R1013 Epigastric pain: Secondary | ICD-10-CM | POA: Diagnosis not present

## 2015-10-10 LAB — CBC
HCT: 46.1 % (ref 39.0–52.0)
Hemoglobin: 15.5 g/dL (ref 13.0–17.0)
MCH: 28.2 pg (ref 26.0–34.0)
MCHC: 33.6 g/dL (ref 30.0–36.0)
MCV: 83.8 fL (ref 78.0–100.0)
PLATELETS: 179 10*3/uL (ref 150–400)
RBC: 5.5 MIL/uL (ref 4.22–5.81)
RDW: 14.6 % (ref 11.5–15.5)
WBC: 10.7 10*3/uL — ABNORMAL HIGH (ref 4.0–10.5)

## 2015-10-10 LAB — URINE MICROSCOPIC-ADD ON

## 2015-10-10 LAB — COMPREHENSIVE METABOLIC PANEL
ALT: 277 U/L — AB (ref 17–63)
AST: 121 U/L — AB (ref 15–41)
Albumin: 3.7 g/dL (ref 3.5–5.0)
Alkaline Phosphatase: 312 U/L — ABNORMAL HIGH (ref 38–126)
Anion gap: 8 (ref 5–15)
BUN: 9 mg/dL (ref 6–20)
CHLORIDE: 105 mmol/L (ref 101–111)
CO2: 26 mmol/L (ref 22–32)
CREATININE: 0.77 mg/dL (ref 0.61–1.24)
Calcium: 9 mg/dL (ref 8.9–10.3)
GFR calc Af Amer: >60 mL/min (ref >60)
GFR calc non Af Amer: >60 mL/min (ref >60)
GLUCOSE: 172 mg/dL — AB (ref 65–99)
Potassium: 4.1 mmol/L (ref 3.5–5.1)
Sodium: 139 mmol/L (ref 135–145)
Total Bilirubin: 6.1 mg/dL — ABNORMAL HIGH (ref 0.3–1.2)
Total Protein: 6.4 g/dL — ABNORMAL LOW (ref 6.5–8.1)

## 2015-10-10 LAB — I-STAT CG4 LACTIC ACID, ED
Lactic Acid, Venous: 2.67 mmol/L (ref 0.5–1.9)
Lactic Acid, Venous: 1.5 mmol/L (ref 0.5–1.9)
Lactic Acid, Venous: 1.42 mmol/L (ref 0.5–1.9)

## 2015-10-10 LAB — URINALYSIS, ROUTINE W REFLEX MICROSCOPIC
GLUCOSE, UA: NEGATIVE mg/dL
KETONES UR: 15 mg/dL — AB
Nitrite: POSITIVE — AB
PROTEIN: NEGATIVE mg/dL
Specific Gravity, Urine: 1.02 (ref 1.005–1.030)
pH: 5.5 (ref 5.0–8.0)

## 2015-10-10 LAB — LIPASE, BLOOD: LIPASE: 644 U/L — AB (ref 11–51)

## 2015-10-10 MED ORDER — DEXTROSE 5 % IV SOLN
2.0000 g | Freq: Two times a day (BID) | INTRAVENOUS | Status: DC
  Administered 2015-10-10 – 2015-10-13 (×6): 2 g via INTRAVENOUS
  Filled 2015-10-10 (×6): qty 2

## 2015-10-10 MED ORDER — LEVOFLOXACIN IN D5W 750 MG/150ML IV SOLN
750.0000 mg | Freq: Once | INTRAVENOUS | Status: AC
  Administered 2015-10-10: 750 mg via INTRAVENOUS
  Filled 2015-10-10: qty 150

## 2015-10-10 MED ORDER — VANCOMYCIN HCL IN DEXTROSE 1-5 GM/200ML-% IV SOLN
1000.0000 mg | Freq: Once | INTRAVENOUS | Status: AC
  Administered 2015-10-10: 1000 mg via INTRAVENOUS
  Filled 2015-10-10: qty 200

## 2015-10-10 MED ORDER — IOPAMIDOL (ISOVUE-300) INJECTION 61%
100.0000 mL | Freq: Once | INTRAVENOUS | Status: AC | PRN
  Administered 2015-10-10: 100 mL via INTRAVENOUS

## 2015-10-10 MED ORDER — VANCOMYCIN HCL 500 MG IV SOLR
500.0000 mg | Freq: Two times a day (BID) | INTRAVENOUS | Status: DC
  Filled 2015-10-10: qty 500

## 2015-10-10 MED ORDER — AZTREONAM 2 G IJ SOLR
2.0000 g | Freq: Once | INTRAMUSCULAR | Status: DC
Start: 2015-10-10 — End: 2015-10-10

## 2015-10-10 MED ORDER — LEVOFLOXACIN IN D5W 750 MG/150ML IV SOLN: 750.0000 mg | INTRAVENOUS | Status: DC

## 2015-10-10 MED ORDER — SODIUM CHLORIDE 0.9 % IV BOLUS (SEPSIS)
1000.0000 mL | Freq: Once | INTRAVENOUS | Status: AC
  Administered 2015-10-10: 1000 mL via INTRAVENOUS

## 2015-10-10 NOTE — ED Triage Notes (Addendum)
Pt from home with his daughter with complaints of abdominal pain, emesis, and cough that began on Tuesday. Pt has been taking a z pack, flonase, and mucinex.  Pt states his abdomen feels bloated and his pain is in his center lower abdomen. Pt has experienced 2 episodes of emesis today. Pt states his pain is worse when he eats. Pt has hx of obstructive gallstone following his gallbladder removal. Pt denies diarrhea. Pt has had burning with urination and frequency.

## 2015-10-10 NOTE — ED Provider Notes (Signed)
[facility] DEPT Provider Note   CSN: [id number] Arrival date & time: 10/10/15  1755     History   Chief Complaint Chief Complaint  Patient presents with  . Abdominal Pain  . Emesis  . Urinary Frequency    HPI [name] is a 82 y.o. male.  HPI   82 year old male presents today with abdominal pain. Patient notes that he started having allergic type symptoms on Tuesday with upper respiratory congestion and cough. He was seen in urgent care and given azithromycin. He reports continued cough and congestion. Patient notes that on Tuesday ( 4 days ago) patient was eating shrimp, had an episode of vomiting after. He reports that he had a cholecystectomy previously. Patient notes central abdominal pain associated with nausea and vomiting, located around the periumbilical region. Patient notes over the last several days he's had dark orange urine, burning with urination. He notes fever over the last 2 days. Pt denies alcohol use, tylenol use.   Patient has a history of cholecystectomy in 2012 with ERCP. Again ERCP in 2015 performed by Dr. Carlean Purl.    Past Medical History:  Diagnosis Date  . Abnormal LFTs   . B12 deficiency   . CHF (congestive heart failure) ([place])   . Choledocholithiasis with obstruction 07/02/2013  . Cluster headache   . Coronary atherosclerosis of unspecified type of vessel, native or graft   . Degeneration of intervertebral disc, site unspecified   . Diverticulosis of colon (without mention of hemorrhage)   . Hyperlipidemia   . Hypertension   . Hypertrophy of prostate without urinary obstruction and other lower urinary tract symptoms (LUTS)   . Monocytosis 01/01/2014  . Nephrolithiasis   . Neurogenic dysphagia   . Osteoarthritis   . Pernicious anemia   . Personal history of colonic polyps    adenomatous  . Prostate enlargement    urinary frequency, nocturia  . Thrombocytopenia, unspecified ([place]) 09/01/2012  . Unspecified sleep apnea    does not  have cpap-was not able to tolerate    Patient Active Problem List   Diagnosis Date Noted  . UTI (lower urinary tract infection) 10/11/2015  . Cough 03/25/2015  . Visit for well man health check 03/12/2015  . Depression 03/12/2015  . Medicare annual wellness visit, subsequent 09/06/2013  . Nephrolithiasis 07/10/2013  . Choledocholithiasis with obstruction 07/02/2013  . Abnormal LFTs 07/02/2013  . Thrombocytopenia ([place]) 09/01/2012  . DNR (do not resuscitate) 06/06/2012  . KNEE REPLACEMENT, RIGHT, HX OF 04/02/2010  . HIP REPLACEMENT, RIGHT, HX OF 06/20/2009  . ANEMIA, PERNICIOUS 01/03/2009  . Coronary atherosclerosis 12/12/2007  . COLONIC POLYPS, HX OF 12/12/2007  . HLD (hyperlipidemia) 06/14/2006  . Essential hypertension 06/14/2006  . DIVERTICULOSIS, COLON 06/14/2006  . Elevated PSA, less than 10 ng/ml 06/14/2006  . DEGENERATIVE DISC DISEASE 06/14/2006  . SLEEP APNEA 06/14/2006  . TONSILLECTOMY AND ADENOIDECTOMY, HX OF 06/14/2006  . TOTAL KNEE REPLACEMENT, HX OF 06/14/2006    Past Surgical History:  Procedure Laterality Date  . BIOPSY PROSTATE  2008   Benign  . BONE MARROW BIOPSY  2011  . CHOLECYSTECTOMY  11/24/2010   Procedure: LAPAROSCOPIC CHOLECYSTECTOMY WITH INTRAOPERATIVE CHOLANGIOGRAM;  Surgeon: Earnstine Regal, MD;  Location: [facility];  Service: General;  Laterality: N/A;  c-arm  . ERCP N/A 07/04/2013   Procedure: ENDOSCOPIC RETROGRADE CHOLANGIOPANCREATOGRAPHY (ERCP);  Surgeon: Gatha Mayer, MD;  Location: [facility];  Service: Endoscopy;  Laterality: N/A;  MAC if available  . HERNIA REPAIR  2004  RIH  . inguinal herniorrhapy  2003  . KNEE ARTHROSCOPY     5 times  . NASAL ENDOSCOPY  2012  . ROTATOR CUFF REPAIR Right 2006  . TONSILLECTOMY    . TOTAL HIP ARTHROPLASTY Right 2011  . TOTAL KNEE ARTHROPLASTY  2003  . [place] Medications    Prior to Admission medications   Medication Sig Start Date End Date Taking? Authorizing Provider    azithromycin (ZITHROMAX) 250 MG tablet Take 250 mg by mouth daily. ABT Start Date 10/07/15 & End Date 10/11/15. 10/08/15  Yes Historical Provider, MD  clobetasol cream (TEMOVATE) 0.10 % Apply 1 application topically 2 (two) times daily. For whelps/itching   Yes Historical Provider, MD  co-enzyme Q-10 30 MG capsule Take 200 mg by mouth daily.   Yes Historical Provider, MD  cyanocobalamin (,VITAMIN B-12,) 1000 MCG/ML injection Inject 1 mL (1,000 mcg total) into the muscle every 30 (thirty) days. 03/27/14  Yes Lucille Passy, MD  dextromethorphan-guaiFENesin [facility] DM) 30-600 MG 12hr tablet Take 1 tablet by mouth 2 (two) times daily as needed for cough.   Yes Historical Provider, MD  doxycycline (VIBRA-TABS) 100 MG tablet Take 1 tablet (100 mg total) by mouth 2 (two) times daily. 03/25/15  Yes Venia Carbon, MD  metoprolol succinate (TOPROL-XL) 25 MG 24 hr tablet Take 1 tablet (25 mg total) by mouth daily. 12/04/14  Yes Lelon Perla, MD  mometasone (NASONEX) 50 MCG/ACT nasal spray Place 2 sprays into the nose daily as needed (congestion).    Yes Historical Provider, MD  Omega-3 Fatty Acids (FISH OIL) 1200 MG CAPS Take 1,200 mg by mouth daily.   Yes Historical Provider, MD  ondansetron (ZOFRAN-ODT) 4 MG disintegrating tablet Take 4 mg by mouth every 8 (eight) hours as needed for nausea or vomiting.   Yes Historical Provider, MD  Tamsulosin HCl (FLOMAX) 0.4 MG CAPS Take 0.4 mg by mouth daily after supper.   Yes Historical Provider, MD  calcium carbonate (TUMS - DOSED IN MG ELEMENTAL CALCIUM) 500 MG chewable tablet Chew 2 tablets by mouth daily as needed for indigestion.     Historical Provider, MD  nitroGLYCERIN (NITROSTAT) 0.4 MG SL tablet Place 1 tablet (0.4 mg total) under the tongue every 5 (five) minutes as needed. Up to 3 doses 01/09/13   Burtis Junes, NP    Family History Family History  Problem Relation Age of Onset  . Colon cancer Father 63  . Peripheral vascular disease Mother      Social History Social History  Substance Use Topics  . Smoking status: Former Smoker    Types: Pipe    Quit date: 01/18/1966  . Smokeless tobacco: Never Used  . Alcohol use No     Allergies   Penicillins; Sulfamethoxazole-trimethoprim; and Sulfonamide derivatives   Review of Systems Review of Systems  All other systems reviewed and are negative.   Physical Exam Updated Vital Signs BP 110/70 (BP Location: Left Arm)   Pulse 77   Temp 99.6 F (37.6 C) (Oral)   Resp 18   Ht 5' (1.524 m)   Wt 63 kg   SpO2 95%   BMI 27.15 kg/m   Physical Exam  Constitutional: He is oriented to person, place, and time. He appears well-developed and well-nourished.  HENT:  Head: Normocephalic and atraumatic.  Mild jaundice   Eyes: Conjunctivae are normal. Pupils are equal, round, and reactive to light. Right eye exhibits no  discharge. Left eye exhibits no discharge. No scleral icterus.  Neck: Normal range of motion. No JVD present. No tracheal deviation present.  Cardiovascular: Regular rhythm.   Pulmonary/Chest: Effort normal. No stridor.  Abdominal: Soft. He exhibits no distension and no mass. There is no tenderness. There is no rebound and no guarding. No hernia.  Neurological: He is alert and oriented to person, place, and time. Coordination normal.  Psychiatric: He has a normal mood and affect. His behavior is normal. Judgment and thought content normal.  Nursing note and vitals reviewed.    ED Treatments / Results  Labs (all labs ordered are listed, but only abnormal results are displayed) Labs Reviewed  LIPASE, BLOOD - Abnormal; Notable for the following:       Result Value   Lipase 644 (*)    All other components within normal limits  COMPREHENSIVE METABOLIC PANEL - Abnormal; Notable for the following:    Glucose, Bld 172 (*)    Total Protein 6.4 (*)    AST 121 (*)    ALT 277 (*)    Alkaline Phosphatase 312 (*)    Total Bilirubin 6.1 (*)    All other components  within normal limits  CBC - Abnormal; Notable for the following:    WBC 10.7 (*)    All other components within normal limits  URINALYSIS, ROUTINE W REFLEX MICROSCOPIC (NOT AT [facility]) - Abnormal; Notable for the following:    Color, Urine AMBER (*)    APPearance CLOUDY (*)    Hgb urine dipstick SMALL (*)    Bilirubin Urine LARGE (*)    Ketones, ur 15 (*)    Nitrite POSITIVE (*)    Leukocytes, UA MODERATE (*)    All other components within normal limits  URINE MICROSCOPIC-ADD ON - Abnormal; Notable for the following:    Squamous Epithelial / LPF 0-5 (*)    Bacteria, UA MANY (*)    All other components within normal limits  I-STAT CG4 LACTIC ACID, ED - Abnormal; Notable for the following:    Lactic Acid, Venous 2.67 (*)    All other components within normal limits  CULTURE, BLOOD (ROUTINE X 2)  CULTURE, BLOOD (ROUTINE X 2)  URINE CULTURE  I-STAT CG4 LACTIC ACID, ED  I-STAT CG4 LACTIC ACID, ED    EKG  EKG Interpretation None       Radiology Dg Chest 2 View  Result Date: 10/10/2015 CLINICAL DATA:  Abdominal pain, vomiting and cough for 4 days, on antibiotics. Abdominal pain. History of hypertension, hyperlipidemia. EXAM: CHEST  2 VIEW COMPARISON:  Chest radiograph January 31, 2015 FINDINGS: The cardiac silhouette is mildly enlarged, mediastinal silhouette is unremarkable. Mildly calcified aortic knob. No pleural effusions or focal consolidations. Trachea projects midline and there is no pneumothorax. Soft tissue planes and included osseous structures are non-suspicious. Thoracolumbar levoscoliosis. Moderate degenerative change of thoracic spine. Severe LEFT glenohumeral osteoarthrosis and loose bodies. IMPRESSION: Mild cardiomegaly, no acute pulmonary process. Electronically Signed   By: Elon Alas M.D.   On: 10/10/2015 23:20   Ct Abdomen Pelvis W Contrast  Result Date: 10/10/2015 CLINICAL DATA:  Abdominal pain, vomiting, cough. EXAM: CT ABDOMEN AND PELVIS WITH CONTRAST  TECHNIQUE: Multidetector CT imaging of the abdomen and pelvis was performed using the standard protocol following bolus administration of intravenous contrast. CONTRAST:  169m ISOVUE-300 IOPAMIDOL (ISOVUE-300) INJECTION 61% COMPARISON:  CT abdomen pelvis dated 06/14 715 FINDINGS: Lower chest: Lung bases are essentially clear. Hepatobiliary: Liver is within normal limits. Status post  cholecystectomy. No intrahepatic or extrahepatic ductal dilatation. Pancreas: Within normal limits. Spleen: Within normal limits. Adrenals/Urinary Tract: Adrenal glands are within normal limits. Kidneys are notable for bilateral renal sinus cysts. Mild urothelial thickening/enhancement involving the left proximal collecting system (series 2/ image 29). No frank hydronephrosis. Mild wall thickening involving the bladder (series 2/image 60). Stomach/Bowel: Stomach is notable for a tiny hiatal hernia. No evidence of bowel obstruction. Normal appendix (series 2/ image 43). Extensive sigmoid diverticulosis, without evidence of diverticulitis. Vascular/Lymphatic: Atherosclerotic calcifications of the abdominal aorta and branch vessels. No evidence of abdominal aortic aneurysm. No suspicious abdominopelvic lymphadenopathy. Reproductive: Prostatomegaly, with enlargement the central gland which indents the base of the bladder. Other: No abdominopelvic ascites. Small fat containing left inguinal hernia (series 2/image 67). Musculoskeletal: Right hip arthroplasty, without evidence of complication. Moderate degenerative changes of the visualized thoracolumbar spine, with associated lumbar dextroscoliosis. IMPRESSION: No evidence of bowel obstruction.  Normal appendix. Extensive sigmoid diverticulosis, without evidence of diverticulitis. Mildly thick-walled bladder, correlate for cystitis. Associated wall thickening/enhancement involving the left proximal collecting system, possibly reflecting ascending infection. Prostatomegaly, suggesting BPH.  Additional ancillary findings as above. Electronically Signed   By: Julian Hy M.D.   On: 10/10/2015 22:33    Procedures Procedures (including critical care time)  Medications Ordered in ED Medications  ceFEPIme (MAXIPIME) 2 g in dextrose 5 % 50 mL IVPB (0 g Intravenous Stopped 10/10/15 2305)  vancomycin (VANCOCIN) 500 mg in sodium chloride 0.9 % 100 mL IVPB (not administered)  levofloxacin (LEVAQUIN) IVPB 750 mg (not administered)  levofloxacin (LEVAQUIN) IVPB 750 mg (0 mg Intravenous Stopped 10/10/15 2216)  vancomycin (VANCOCIN) IVPB 1000 mg/200 mL premix (0 mg Intravenous Stopped 10/10/15 2216)  sodium chloride 0.9 % bolus 1,000 mL (0 mLs Intravenous Stopped 10/10/15 2112)  iopamidol (ISOVUE-300) 61 % injection 100 mL (100 mLs Intravenous Contrast Given 10/10/15 2141)     Initial Impression / Assessment and Plan / ED Course  I have reviewed the triage vital signs and the nursing notes.  Pertinent labs & imaging results that were available during my care of the patient were reviewed by me and considered in my medical decision making (see chart for details).  Clinical Course     Final Clinical Impressions(s) / ED Diagnoses   Final diagnoses:  UTI (lower urinary tract infection)   Labs:  Imaging:  Consults:  Therapeutics:  Discharge Meds:   Assessment/Plan:  82 year old male presents today with fever, abdominal pain, dysuria. Patient's initial lactic acid elevated, sepsis protocol initiated. Patient concerning for pneumonia, intra-abdominal pathology, urinary tract infection. Although patient was reporting severe abdominal pain earlier in the day, his abdomen was soft nontender. Patient will need CT evaluation, chest x-ray, laboratory analysis.   Patient's labs returned showing significant elevation in LFTs alkaline phosphatase and bilirubin. Patient was found to have urinary tract infection likely source of septic presentation. Patient was given a liter of fluid which  cleared his lactic acid, heart rate returned down to normal limits, he is remained normotensive. Patient's CT scan showed no signs of intrahepatic or extrahepatic ductal dilatation, pancreatic involvement.   Hospitalist consult for admission who recommended GI consult. I spoke with Dr. Edison Nasuti of gastroenterology who would see the patient tomorrow morning in the hospital. Unclear     New Prescriptions New Prescriptions   No medications on file     Okey Regal, PA-C 10/11/15 0041    Davonna Belling, MD 10/11/15 [phone]

## 2015-10-10 NOTE — Progress Notes (Signed)
Pharmacy Antibiotic Note  KIOWA MAIORANA is a 82 y.o. male with PMH CHF, diverticulosis, HLD, HTN, OA,  admitted on 10/10/2015 with sepsis from multiple potential sources. Reports chest congestion, vomiting, abdominal pain, and dysuria but no fevers. Pharmacy has been consulted for vancomycin, cefepime, and levofloxacin dosing. Repeat lactate wnl.  Plan:  Vancomycin 1000 mg IV now, then 500 mg IV q12 hr; goal trough 15-20 mcg/mL  Measure vancomycin trough levels at steady state as indicated  Cefepime 2 g IV q12 hr  Levofloxacin 750 mg IV q24 hr Follow clinical course, renal function, culture results as available Follow for de-escalation of antibiotics and LOT   Height: 5' (152.4 cm) Weight: 139 lb (63 kg) IBW/kg (Calculated) : 50  Temp (24hrs), Avg:99.7 F (37.6 C), Min:99.6 F (37.6 C), Max:99.8 F (37.7 C)   Recent Labs Lab 10/10/15 1838 10/10/15 1918 10/10/15 2022  WBC 10.7*  --   --   CREATININE 0.77  --   --   LATICACIDVEN  --  2.67* 1.50    Estimated Creatinine Clearance: 57.5 mL/min (by C-G formula based on SCr of 0.77 mg/dL).    Allergies  Allergen Reactions  . Penicillins Other (See Comments)    [place], passed out  . Sulfamethoxazole-Trimethoprim Rash  . Sulfonamide Derivatives Rash    Antimicrobials this admission: Vancomycin 9/22 >>  Cefepime 9/22 >>  Levofloxacin 9/22 >>   Dose adjustments this admission: ---  Microbiology results: 9/22 BCx: sent 9/22 UCx: sent    Thank you for allowing pharmacy to be a part of this patient's care.  Reuel Boom, PharmD, BCPS Pager: [phone] 10/10/2015, 8:46 PM

## 2015-10-11 ENCOUNTER — Inpatient Hospital Stay (HOSPITAL_COMMUNITY): Payer: Medicare Other

## 2015-10-11 DIAGNOSIS — Z86010 Personal history of colonic polyps: Secondary | ICD-10-CM | POA: Diagnosis not present

## 2015-10-11 DIAGNOSIS — N400 Enlarged prostate without lower urinary tract symptoms: Secondary | ICD-10-CM | POA: Diagnosis present

## 2015-10-11 DIAGNOSIS — R1013 Epigastric pain: Secondary | ICD-10-CM

## 2015-10-11 DIAGNOSIS — R05 Cough: Secondary | ICD-10-CM | POA: Diagnosis not present

## 2015-10-11 DIAGNOSIS — K8050 Calculus of bile duct without cholangitis or cholecystitis without obstruction: Secondary | ICD-10-CM

## 2015-10-11 DIAGNOSIS — Z96641 Presence of right artificial hip joint: Secondary | ICD-10-CM | POA: Diagnosis present

## 2015-10-11 DIAGNOSIS — D510 Vitamin B12 deficiency anemia due to intrinsic factor deficiency: Secondary | ICD-10-CM | POA: Diagnosis present

## 2015-10-11 DIAGNOSIS — Z66 Do not resuscitate: Secondary | ICD-10-CM | POA: Diagnosis not present

## 2015-10-11 DIAGNOSIS — A419 Sepsis, unspecified organism: Secondary | ICD-10-CM | POA: Diagnosis present

## 2015-10-11 DIAGNOSIS — I272 Other secondary pulmonary hypertension: Secondary | ICD-10-CM | POA: Diagnosis not present

## 2015-10-11 DIAGNOSIS — Z87442 Personal history of urinary calculi: Secondary | ICD-10-CM | POA: Diagnosis not present

## 2015-10-11 DIAGNOSIS — I444 Left anterior fascicular block: Secondary | ICD-10-CM | POA: Diagnosis present

## 2015-10-11 DIAGNOSIS — R7989 Other specified abnormal findings of blood chemistry: Secondary | ICD-10-CM

## 2015-10-11 DIAGNOSIS — K859 Acute pancreatitis without necrosis or infection, unspecified: Secondary | ICD-10-CM | POA: Diagnosis present

## 2015-10-11 DIAGNOSIS — K8031 Calculus of bile duct with cholangitis, unspecified, with obstruction: Secondary | ICD-10-CM | POA: Diagnosis present

## 2015-10-11 DIAGNOSIS — R109 Unspecified abdominal pain: Secondary | ICD-10-CM | POA: Diagnosis present

## 2015-10-11 DIAGNOSIS — I509 Heart failure, unspecified: Secondary | ICD-10-CM | POA: Diagnosis present

## 2015-10-11 DIAGNOSIS — Z87891 Personal history of nicotine dependence: Secondary | ICD-10-CM | POA: Diagnosis not present

## 2015-10-11 DIAGNOSIS — R1084 Generalized abdominal pain: Secondary | ICD-10-CM

## 2015-10-11 DIAGNOSIS — G4733 Obstructive sleep apnea (adult) (pediatric): Secondary | ICD-10-CM | POA: Diagnosis present

## 2015-10-11 DIAGNOSIS — E785 Hyperlipidemia, unspecified: Secondary | ICD-10-CM | POA: Diagnosis present

## 2015-10-11 DIAGNOSIS — I2510 Atherosclerotic heart disease of native coronary artery without angina pectoris: Secondary | ICD-10-CM | POA: Diagnosis present

## 2015-10-11 DIAGNOSIS — J069 Acute upper respiratory infection, unspecified: Secondary | ICD-10-CM | POA: Diagnosis present

## 2015-10-11 DIAGNOSIS — I110 Hypertensive heart disease with heart failure: Secondary | ICD-10-CM | POA: Diagnosis not present

## 2015-10-11 DIAGNOSIS — R17 Unspecified jaundice: Secondary | ICD-10-CM | POA: Diagnosis not present

## 2015-10-11 DIAGNOSIS — Z9049 Acquired absence of other specified parts of digestive tract: Secondary | ICD-10-CM | POA: Diagnosis not present

## 2015-10-11 DIAGNOSIS — R112 Nausea with vomiting, unspecified: Secondary | ICD-10-CM | POA: Diagnosis not present

## 2015-10-11 DIAGNOSIS — K8043 Calculus of bile duct with acute cholecystitis with obstruction: Secondary | ICD-10-CM | POA: Diagnosis not present

## 2015-10-11 DIAGNOSIS — D696 Thrombocytopenia, unspecified: Secondary | ICD-10-CM | POA: Diagnosis not present

## 2015-10-11 DIAGNOSIS — N390 Urinary tract infection, site not specified: Secondary | ICD-10-CM | POA: Diagnosis present

## 2015-10-11 DIAGNOSIS — J40 Bronchitis, not specified as acute or chronic: Secondary | ICD-10-CM | POA: Diagnosis present

## 2015-10-11 DIAGNOSIS — Z96651 Presence of right artificial knee joint: Secondary | ICD-10-CM | POA: Diagnosis present

## 2015-10-11 DIAGNOSIS — R1033 Periumbilical pain: Secondary | ICD-10-CM | POA: Diagnosis present

## 2015-10-11 LAB — COMPREHENSIVE METABOLIC PANEL
ALT: 208 U/L — ABNORMAL HIGH (ref 17–63)
AST: 103 U/L — ABNORMAL HIGH (ref 15–41)
Albumin: 2.8 g/dL — ABNORMAL LOW (ref 3.5–5.0)
Alkaline Phosphatase: 246 U/L — ABNORMAL HIGH (ref 38–126)
Anion gap: 7 (ref 5–15)
BUN: 6 mg/dL (ref 6–20)
CHLORIDE: 112 mmol/L — AB (ref 101–111)
CO2: 21 mmol/L — ABNORMAL LOW (ref 22–32)
CREATININE: 0.59 mg/dL — AB (ref 0.61–1.24)
Calcium: 7.9 mg/dL — ABNORMAL LOW (ref 8.9–10.3)
Glucose, Bld: 100 mg/dL — ABNORMAL HIGH (ref 65–99)
POTASSIUM: 3.5 mmol/L (ref 3.5–5.1)
Sodium: 140 mmol/L (ref 135–145)
Total Bilirubin: 6.2 mg/dL — ABNORMAL HIGH (ref 0.3–1.2)
Total Protein: 4.8 g/dL — ABNORMAL LOW (ref 6.5–8.1)

## 2015-10-11 LAB — CBC
HCT: 38.3 % — ABNORMAL LOW (ref 39.0–52.0)
Hemoglobin: 12.9 g/dL — ABNORMAL LOW (ref 13.0–17.0)
MCH: 27.6 pg (ref 26.0–34.0)
MCHC: 33.7 g/dL (ref 30.0–36.0)
MCV: 81.8 fL (ref 78.0–100.0)
PLATELETS: 143 10*3/uL — AB (ref 150–400)
RBC: 4.68 MIL/uL (ref 4.22–5.81)
RDW: 14.7 % (ref 11.5–15.5)
WBC: 6.3 10*3/uL (ref 4.0–10.5)

## 2015-10-11 LAB — BILIRUBIN, DIRECT: Bilirubin, Direct: 4.1 mg/dL — ABNORMAL HIGH (ref 0.1–0.5)

## 2015-10-11 LAB — PROTIME-INR
INR: 1.18
Prothrombin Time: 15.1 seconds (ref 11.4–15.2)

## 2015-10-11 LAB — LACTIC ACID, PLASMA
Lactic Acid, Venous: 1 mmol/L (ref 0.5–1.9)
Lactic Acid, Venous: 0.7 mmol/L (ref 0.5–1.9)

## 2015-10-11 LAB — GLUCOSE, CAPILLARY: GLUCOSE-CAPILLARY: 97 mg/dL (ref 65–99)

## 2015-10-11 LAB — PROCALCITONIN: Procalcitonin: 0.37 ng/mL

## 2015-10-11 LAB — APTT: APTT: 28 s (ref 24–36)

## 2015-10-11 LAB — LIPASE, BLOOD: Lipase: 36 U/L (ref 11–51)

## 2015-10-11 MED ORDER — NITROGLYCERIN 0.4 MG SL SUBL: 0.4000 mg | SUBLINGUAL_TABLET | SUBLINGUAL | Status: DC | PRN

## 2015-10-11 MED ORDER — CALCIUM CARBONATE ANTACID 500 MG PO CHEW: 2.0000 | CHEWABLE_TABLET | Freq: Every day | ORAL | Status: DC | PRN

## 2015-10-11 MED ORDER — ONDANSETRON HCL 4 MG/2ML IJ SOLN: 4.0000 mg | Freq: Three times a day (TID) | INTRAMUSCULAR | Status: DC | PRN

## 2015-10-11 MED ORDER — TAMSULOSIN HCL 0.4 MG PO CAPS
0.4000 mg | ORAL_CAPSULE | Freq: Every day | ORAL | Status: DC
  Administered 2015-10-11 – 2015-10-13 (×2): 0.4 mg via ORAL
  Filled 2015-10-11 (×3): qty 1

## 2015-10-11 MED ORDER — SODIUM CHLORIDE 0.9% FLUSH
3.0000 mL | Freq: Two times a day (BID) | INTRAVENOUS | Status: DC
  Administered 2015-10-11 – 2015-10-14 (×5): 3 mL via INTRAVENOUS

## 2015-10-11 MED ORDER — AZITHROMYCIN 250 MG PO TABS: 250.0000 mg | ORAL_TABLET | Freq: Every day | ORAL | Status: DC

## 2015-10-11 MED ORDER — ACETAMINOPHEN 325 MG PO TABS: 650.0000 mg | ORAL_TABLET | ORAL | Status: DC | PRN

## 2015-10-11 MED ORDER — OMEGA-3-ACID ETHYL ESTERS 1 G PO CAPS
1.0000 g | ORAL_CAPSULE | Freq: Every day | ORAL | Status: DC
  Administered 2015-10-11 – 2015-10-14 (×4): 1 g via ORAL
  Filled 2015-10-11 (×4): qty 1

## 2015-10-11 MED ORDER — MORPHINE SULFATE (PF) 2 MG/ML IV SOLN
2.0000 mg | INTRAVENOUS | Status: DC | PRN
Start: 2015-10-11 — End: 2015-10-13

## 2015-10-11 MED ORDER — COENZYME Q10 30 MG PO CAPS: 200.0000 mg | ORAL_CAPSULE | Freq: Every day | ORAL | Status: DC

## 2015-10-11 MED ORDER — SODIUM CHLORIDE 0.9 % IV BOLUS (SEPSIS)
1500.0000 mL | Freq: Once | INTRAVENOUS | Status: AC
  Administered 2015-10-11: 1500 mL via INTRAVENOUS

## 2015-10-11 MED ORDER — CYANOCOBALAMIN 1000 MCG/ML IJ SOLN
1000.0000 ug | Freq: Once | INTRAMUSCULAR | Status: AC
  Administered 2015-10-11: 1000 ug via INTRAMUSCULAR
  Filled 2015-10-11 (×2): qty 1

## 2015-10-11 MED ORDER — DM-GUAIFENESIN ER 30-600 MG PO TB12
1.0000 | ORAL_TABLET | Freq: Two times a day (BID) | ORAL | Status: DC | PRN
  Administered 2015-10-12 – 2015-10-13 (×2): 1 via ORAL
  Filled 2015-10-11 (×3): qty 1

## 2015-10-11 MED ORDER — SODIUM CHLORIDE 0.9 % IV SOLN
INTRAVENOUS | Status: DC
  Administered 2015-10-11: 03:00:00 via INTRAVENOUS
  Administered 2015-10-11 (×2): 100 mL/h via INTRAVENOUS
  Administered 2015-10-12: 12:00:00 via INTRAVENOUS

## 2015-10-11 MED ORDER — ZOLPIDEM TARTRATE 5 MG PO TABS
5.0000 mg | ORAL_TABLET | Freq: Every evening | ORAL | Status: DC | PRN
  Administered 2015-10-13: 5 mg via ORAL
  Filled 2015-10-11: qty 1

## 2015-10-11 MED ORDER — METOPROLOL SUCCINATE ER 25 MG PO TB24
25.0000 mg | ORAL_TABLET | Freq: Every day | ORAL | Status: DC
  Administered 2015-10-11 – 2015-10-14 (×4): 25 mg via ORAL
  Filled 2015-10-11 (×4): qty 1

## 2015-10-11 MED ORDER — OXYCODONE HCL 5 MG PO TABS: 5.0000 mg | ORAL_TABLET | ORAL | Status: DC | PRN

## 2015-10-11 MED ORDER — CLOBETASOL PROPIONATE 0.05 % EX CREA
1.0000 "application " | TOPICAL_CREAM | Freq: Two times a day (BID) | CUTANEOUS | Status: DC
  Administered 2015-10-11 – 2015-10-14 (×7): 1 via TOPICAL
  Filled 2015-10-11: qty 15

## 2015-10-11 MED ORDER — FLUTICASONE PROPIONATE 50 MCG/ACT NA SUSP
1.0000 | Freq: Every day | NASAL | Status: DC
  Administered 2015-10-11 – 2015-10-14 (×4): 1 via NASAL
  Filled 2015-10-11: qty 16

## 2015-10-11 NOTE — Progress Notes (Addendum)
Patient seen and examined Please refer to H&P by  Ivor Costa, MD  82 y.o. male.  PMHCAD, BPH, 08/2010 cholecystectomy, pernicious anemia, and chronic thrombocytopenia. HTN.  OSA, intolerant of CPAP.  Pulmonary htn. Had bil cataract surgery in late August 2017.who presents with abdominal pain, jaundice. Found to have elevated lipase 07/2010 ERCP with sphincterotomy and stone removal.  06/2013 ERCP with sphincterotomy and removal of 2 CBD stones. Now admitted for Recurrent choledocholithiasis with mild associated pancreatitis.    Plan is for ERCP tomorrow Patient currently on cefepime for urinary tract infection-follow urine culture blood culture closely

## 2015-10-11 NOTE — Progress Notes (Signed)
PHARMACIST - PHYSICIAN ORDER COMMUNICATION ° °CONCERNING: P&T Medication Policy on Herbal Medications ° °DESCRIPTION:  This patient’s order for:  CoEnzyme Q10  has been noted. ° °This product(s) is classified as an “herbal” or natural product. °Due to a lack of definitive safety studies or FDA approval, nonstandard manufacturing practices, plus the potential risk of unknown drug-drug interactions while on inpatient medications, the Pharmacy and Therapeutics Committee does not permit the use of “herbal” or natural products of this type within [place]. °  °ACTION TAKEN: °The pharmacy department is unable to verify this order at this time and your patient has been informed of this safety policy. °Please reevaluate patient’s clinical condition at discharge and address if the herbal or natural product(s) should be resumed at that time.  ° °Tal Neer, PharmD °

## 2015-10-11 NOTE — Consult Note (Addendum)
[facility] Consult: 8:15 AM 10/11/2015  LOS: 0 days    Referring Provider: Dr Allyson Sabal  Primary Care Physician:  Arnette Norris, MD Primary Gastroenterologist:  Dr. Carlean Purl    Reason for Consultation:  Jaundice and abd pain   HPI: [name] is a 82 y.o. male.  PMH CAD, BPH, 08/2010 cholecystectomy, pernicious anemia, and chronic thrombocytopenia. HTN.  OSA, intolerant of CPAP.  Pulmonary htn. Had bil cataract surgery in late August 2017. 07/2010 ERCP with sphincterotomy and stone removal.   06/2013 ERCP with sphincterotomy and removal of 2 CBD stones. 2003 and 12/2006 Colonoscopy.  Adenomatous polyp in 2008.   On Tuesday 9/19 started on z pack for 1 week of yellow sputum with cough and sinus drainage, this is an annual thing for him that he links to seasonal occurrence and allergies.  But on the 17th or so he had begun to have nausea and emesis.   Came from home to ED yesterday evening.  3 days of episodic post prandial abdominal pain just behind umbilicus, distention and bilious N/V.  Additionally with burning at urination and very dark urine which started 9/19.  No chills, fevers.    Current sxs not like when he had gallstones, CBD stones in past. Clinically jaundiced with Alk phos 312, AST/ALT 121/277, t bili 6.4.  Lipase 644.  U/A positive for UTI.  WBCs 10.7.  Platelets 143 K.   CT scan: thick walled urinary bladder and left collecting system.  Biliary tree not dilated an liver/pancrease unremarkable.  Sigmoid diverticulosis. Atherosclerotic calcifications of the abdominal aorta and branch vessels.  Smalll left inguinal hernia.     Previous exposure to Z pack without problems.   Weight is stable.  Generally no GI issues.     Past Medical History:  Diagnosis Date  . Abnormal LFTs   . B12 deficiency     . CHF (congestive heart failure) ([place])   . Choledocholithiasis with obstruction 07/02/2013  . Cluster headache   . Coronary atherosclerosis of unspecified type of vessel, native or graft   . Degeneration of intervertebral disc, site unspecified   . Diverticulosis of colon (without mention of hemorrhage)   . Hyperlipidemia   . Hypertension   . Hypertrophy of prostate without urinary obstruction and other lower urinary tract symptoms (LUTS)   . Monocytosis 01/01/2014  . Nephrolithiasis   . Neurogenic dysphagia   . Osteoarthritis   . Pernicious anemia   . Personal history of colonic polyps    adenomatous  . Prostate enlargement    urinary frequency, nocturia  . Thrombocytopenia, unspecified ([place]) 09/01/2012  . Unspecified sleep apnea    does not have cpap-was not able to tolerate    Past Surgical History:  Procedure Laterality Date  . BIOPSY PROSTATE  2008   Benign  . BONE MARROW BIOPSY  2011  . CHOLECYSTECTOMY  11/24/2010   Procedure: LAPAROSCOPIC CHOLECYSTECTOMY WITH INTRAOPERATIVE CHOLANGIOGRAM;  Surgeon: Earnstine Regal, MD;  Location: [facility];  Service: General;  Laterality: N/A;  c-arm  . ERCP N/A 07/04/2013  Procedure: ENDOSCOPIC RETROGRADE CHOLANGIOPANCREATOGRAPHY (ERCP);  Surgeon: Gatha Mayer, MD;  Location: [facility];  Service: Endoscopy;  Laterality: N/A;  MAC if available  . HERNIA REPAIR  2004   RIH  . inguinal herniorrhapy  2003  . KNEE ARTHROSCOPY     5 times  . NASAL ENDOSCOPY  2012  . ROTATOR CUFF REPAIR Right 2006  . TONSILLECTOMY    . TOTAL HIP ARTHROPLASTY Right 2011  . TOTAL KNEE ARTHROPLASTY  2003  . UVULECTOMY  1993    Prior to Admission medications   Medication Sig Start Date End Date Taking? Authorizing Provider  azithromycin (ZITHROMAX) 250 MG tablet Take 250 mg by mouth daily. ABT Start Date 10/07/15 & End Date 10/11/15. 10/08/15  Yes Historical Provider, MD  clobetasol cream (TEMOVATE) 2.42 % Apply 1 application topically 2 (two) times daily.  For whelps/itching   Yes Historical Provider, MD  co-enzyme Q-10 30 MG capsule Take 200 mg by mouth daily.   Yes Historical Provider, MD  cyanocobalamin (,VITAMIN B-12,) 1000 MCG/ML injection Inject 1 mL (1,000 mcg total) into the muscle every 30 (thirty) days. 03/27/14  Yes Lucille Passy, MD  dextromethorphan-guaiFENesin [facility] DM) 30-600 MG 12hr tablet Take 1 tablet by mouth 2 (two) times daily as needed for cough.   Yes Historical Provider, MD  doxycycline (VIBRA-TABS) 100 MG tablet Take 1 tablet (100 mg total) by mouth 2 (two) times daily. 03/25/15  Yes Venia Carbon, MD  metoprolol succinate (TOPROL-XL) 25 MG 24 hr tablet Take 1 tablet (25 mg total) by mouth daily. 12/04/14  Yes Lelon Perla, MD  mometasone (NASONEX) 50 MCG/ACT nasal spray Place 2 sprays into the nose daily as needed (congestion).    Yes Historical Provider, MD  Omega-3 Fatty Acids (FISH OIL) 1200 MG CAPS Take 1,200 mg by mouth daily.   Yes Historical Provider, MD  ondansetron (ZOFRAN-ODT) 4 MG disintegrating tablet Take 4 mg by mouth every 8 (eight) hours as needed for nausea or vomiting.   Yes Historical Provider, MD  Tamsulosin HCl (FLOMAX) 0.4 MG CAPS Take 0.4 mg by mouth daily after supper.   Yes Historical Provider, MD  calcium carbonate (TUMS - DOSED IN MG ELEMENTAL CALCIUM) 500 MG chewable tablet Chew 2 tablets by mouth daily as needed for indigestion.     Historical Provider, MD  nitroGLYCERIN (NITROSTAT) 0.4 MG SL tablet Place 1 tablet (0.4 mg total) under the tongue every 5 (five) minutes as needed. Up to 3 doses 01/09/13   Burtis Junes, NP    Scheduled Meds: . azithromycin  250 mg Oral Daily  . ceFEPime (MAXIPIME) IV  2 g Intravenous BID  . clobetasol cream  1 application Topical BID  . cyanocobalamin  1,000 mcg Intramuscular Once  . fluticasone  1 spray Each Nare Daily  . metoprolol succinate  25 mg Oral Daily  . omega-3 acid ethyl esters  1 g Oral Daily  . sodium chloride flush  3 mL Intravenous Q12H   . tamsulosin  0.4 mg Oral QPC supper   Infusions: . sodium chloride 100 mL/hr at 10/11/15 0322   PRN Meds: calcium carbonate, dextromethorphan-guaiFENesin, morphine injection, nitroGLYCERIN, ondansetron, oxyCODONE, zolpidem   Allergies as of 10/10/2015 - Review Complete 10/10/2015  Allergen Reaction Noted  . Penicillins Other (See Comments)   . Sulfamethoxazole-trimethoprim Rash 12/27/2008  . Sulfonamide derivatives Rash 12/22/2009    Family History  Problem Relation Age of Onset  . Colon cancer Father 58  . Peripheral vascular disease  Mother     Social History   Social History  . Marital status: Married    Spouse name: N/A  . Number of children: 2  . Years of education: N/A   Occupational History  . retired Retired   Social History Main Topics  . Smoking status: Former Smoker    Types: Pipe    Quit date: 01/18/1966  . Smokeless tobacco: Never Used  . Alcohol use No  . Drug use: No  . Sexual activity: Not on file   Other Topics Concern  . Not on file   Social History Narrative   DNR   Widowed   2 daughters leave near by    REVIEW OF SYSTEMS: Constitutional:  No issues with fatigue generally but malaise in last several days.  Does not exercise.  ENT:  No nose bleeds Pulm:  Per HPI.  No dyspnea CV:  No palpitations, no LE edema. No chest pain GU:  No hematuria, no frequency GI:  Per HPI.  No dysphagia Heme:  No issues with unusual bleeding or bruising   Transfusions:  none Neuro:  No headaches, no peripheral tingling or numbness.  No presyncope Derm:  No itching, no rash or sores.  Endocrine:  No sweats or chills.  No polyuria or dysuria Immunization:  Not queried Travel:  None beyond local counties in last few months.    PHYSICAL EXAM: Vital signs in last 24 hours: Vitals:   10/11/15 0035 10/11/15 0450  BP: 110/70 129/62  Pulse: 77 93  Resp: 18 20  Temp:  99.2 F (37.3 C)   Wt Readings from Last 3 Encounters:  10/11/15 62.8 kg (138 lb 8 oz)   03/25/15 66.3 kg (146 lb 4 oz)  03/12/15 66.3 kg (146 lb 4 oz)    General: pleasant, not jaundiced looking older but healthy looking WM.  comfortable Head:  No asymmetry or swelling  Eyes:  Slight icterus, no pallor.  EOMI Ears:  Not HOH  Nose:  No discharge Mouth:  Clear and moist.  Tongue midline Neck:  No mass or JVD Lungs:  Clear bil.  No cough or dyspnea. Heart: RRR.  No mrg.  S1/s2 present Abdomen:  Soft, NT, ND.  No mass or HSM.  Active BS.  No bruits or obvious hernias.   Rectal: deferred   Musc/Skeltl: no joint swelling or gross deformity Extremities:  No CCE  Neurologic:  Oriented x 3.  Excellent historian.  Moves all 4 limbs and strength full.  No gross deficits or tremors Skin:  No telangectasia, sores or rash Tattoos:  non   Psych:  Pleasant, slightly anxious.  Fully cooperative.  Affect normal, not depressed.   Intake/Output from previous day: 09/22 0701 - 09/23 0700 In: 413.3 [I.V.:363.3; IV Piggyback:50] Out: 375 [Urine:375] Intake/Output this shift: No intake/output data recorded.  LAB RESULTS:  Recent Labs  10/10/15 1838 10/11/15 0535  WBC 10.7* 6.3  HGB 15.5 12.9*  HCT 46.1 38.3*  PLT 179 143*   BMET Lab Results  Component Value Date   NA 140 10/11/2015   NA 139 10/10/2015   NA 142 03/12/2015   K 3.5 10/11/2015   K 4.1 10/10/2015   K 4.2 03/12/2015   CL 112 (H) 10/11/2015   CL 105 10/10/2015   CL 105 03/12/2015   CO2 21 (L) 10/11/2015   CO2 26 10/10/2015   CO2 29 03/12/2015   GLUCOSE 100 (H) 10/11/2015   GLUCOSE 172 (H) 10/10/2015   GLUCOSE 92 03/12/2015  BUN 6 10/11/2015   BUN 9 10/10/2015   BUN 10 03/12/2015   CREATININE 0.59 (L) 10/11/2015   CREATININE 0.77 10/10/2015   CREATININE 0.95 03/12/2015   CALCIUM 7.9 (L) 10/11/2015   CALCIUM 9.0 10/10/2015   CALCIUM 9.2 03/12/2015   LFT  Recent Labs  10/10/15 1838 10/11/15 0535  PROT 6.4* 4.8*  ALBUMIN 3.7 2.8*  AST 121* 103*  ALT 277* 208*  ALKPHOS 312* 246*  BILITOT  6.1* 6.2*  BILIDIR  --  4.1*   PT/INR Lab Results  Component Value Date   INR 1.18 10/11/2015   INR 1.08 11/18/2010   INR 1.0 08/17/2010   Hepatitis Panel No results for input(s): HEPBSAG, HCVAB, HEPAIGM, HEPBIGM in the last 72 hours. C-Diff No components found for: CDIFF Lipase     Component Value Date/Time   LIPASE 644 (H) 10/10/2015 1838    Drugs of Abuse  No results found for: LABOPIA, COCAINSCRNUR, LABBENZ, AMPHETMU, THCU, LABBARB   RADIOLOGY STUDIES: Dg Chest 2 View  Result Date: 10/10/2015 CLINICAL DATA:  Abdominal pain, vomiting and cough for 4 days, on antibiotics. Abdominal pain. History of hypertension, hyperlipidemia. EXAM: CHEST  2 VIEW COMPARISON:  Chest radiograph January 31, 2015 FINDINGS: The cardiac silhouette is mildly enlarged, mediastinal silhouette is unremarkable. Mildly calcified aortic knob. No pleural effusions or focal consolidations. Trachea projects midline and there is no pneumothorax. Soft tissue planes and included osseous structures are non-suspicious. Thoracolumbar levoscoliosis. Moderate degenerative change of thoracic spine. Severe LEFT glenohumeral osteoarthrosis and loose bodies. IMPRESSION: Mild cardiomegaly, no acute pulmonary process. Electronically Signed   By: Elon Alas M.D.   On: 10/10/2015 23:20   Ct Abdomen Pelvis W Contrast  Result Date: 10/10/2015 CLINICAL DATA:  Abdominal pain, vomiting, cough. EXAM: CT ABDOMEN AND PELVIS WITH CONTRAST TECHNIQUE: Multidetector CT imaging of the abdomen and pelvis was performed using the standard protocol following bolus administration of intravenous contrast. CONTRAST:  191m ISOVUE-300 IOPAMIDOL (ISOVUE-300) INJECTION 61% COMPARISON:  CT abdomen pelvis dated 06/14 715 FINDINGS: Lower chest: Lung bases are essentially clear. Hepatobiliary: Liver is within normal limits. Status post cholecystectomy. No intrahepatic or extrahepatic ductal dilatation. Pancreas: Within normal limits. Spleen: Within  normal limits. Adrenals/Urinary Tract: Adrenal glands are within normal limits. Kidneys are notable for bilateral renal sinus cysts. Mild urothelial thickening/enhancement involving the left proximal collecting system (series 2/ image 29). No frank hydronephrosis. Mild wall thickening involving the bladder (series 2/image 60). Stomach/Bowel: Stomach is notable for a tiny hiatal hernia. No evidence of bowel obstruction. Normal appendix (series 2/ image 43). Extensive sigmoid diverticulosis, without evidence of diverticulitis. Vascular/Lymphatic: Atherosclerotic calcifications of the abdominal aorta and branch vessels. No evidence of abdominal aortic aneurysm. No suspicious abdominopelvic lymphadenopathy. Reproductive: Prostatomegaly, with enlargement the central gland which indents the base of the bladder. Other: No abdominopelvic ascites. Small fat containing left inguinal hernia (series 2/image 67). Musculoskeletal: Right hip arthroplasty, without evidence of complication. Moderate degenerative changes of the visualized thoracolumbar spine, with associated lumbar dextroscoliosis. IMPRESSION: No evidence of bowel obstruction.  Normal appendix. Extensive sigmoid diverticulosis, without evidence of diverticulitis. Mildly thick-walled bladder, correlate for cystitis. Associated wall thickening/enhancement involving the left proximal collecting system, possibly reflecting ascending infection. Prostatomegaly, suggesting BPH. Additional ancillary findings as above. Electronically Signed   By: SJulian HyM.D.   On: 10/10/2015 22:33     IMPRESSION:   *  Acute abdominal pain with n/v, jaundice and cholestatic LFT pattern, elevated Lipase.  Hx recurrent choledocholithiasis and 2 ERCPs with sphinct and  stone removal in past.  GB is out.  ? Recurrent choledocholithiasis with mild associated pancreatitis, despite no revealing findings on CT  ? Could he have DILI from th z pack?   *  UTI.   Levaquin added to Z  pack.    *  URI.  Has one more day of Zpack today but with ? Of this causing DILI, will stop.  URI is likely allergies or viral anyway.    *  Chronic thrombocytopenia, historically worsens when ill.  Dr Beryle Beams has followed him in past.  *  Adenomatous colon polyps in 2008  *  Osa, intolerant of CPAP.  No acute resp distress at present.     PLAN:     *  See below   Azucena Freed  10/11/2015, 8:15 AM Pager: [phone]  ________________________________________________________________________  Velora Heckler [facility] MD note:  I personally examined the patient, reviewed the data and agree with the assessment and plan described above.  He's had CBD stones removed twice by ERCP and I think it is very likely he has symptomatic CBD stones +/- cholangitis now.  Liver tests are elevated and although z pack can cause cholestasis (and pancreatitis) he's never had trouble with it in the past, his postprandial epigastric pains started 2-3 weeks before the z pack and he has history of recurrent CBD stones.  Will plan on ERCP tomorrow morning... For now planning on start time 7:30 am.    Owens Loffler, MD [facility] Pager [phone]

## 2015-10-11 NOTE — H&P (Signed)
History and Physical    [name] YIF:[id number] DOB: 1933-11-22 DOA: 10/10/2015  Referring MD/NP/PA:   PCP: Arnette Norris, MD   Patient coming from:  The patient is coming from home.  At baseline, pt is independent for most of ADL.   Chief Complaint: Abdominal pain, jaundice, dysuria, cough  HPI: [name] is a 82 y.o. male with medical history significant of hypertension, hyperlipidemia, pernicious anemia, BPH, CAD, s/p of cholecystectomy, choledocholithiasis, who presents with abdominal pain, jaundice, dysuria, cough.  Patient states that he has abdominal pain the past 3 days. It is associated with nausea, vomiting and abdominal distention. No diarrhea. He was noted to have yellow eyes by her daughter. He vomited twice today. His abdominal pain is located in the umbilical area, constant, sharp, 8 out of 10 in severity, nonradiating. It is aggravated by eating food.  Patient also has dysuria, and burning on urination for 2 days. He states that he has been coughing with yellow colored sputum production for 7 days. No fever, chills, shortness of breath. He has mild chest pain which is induced by coughing only. He has been taking Z-Pak for 4 days, has 1 more pill left.   ED Course: pt was found to have abnormal liver function with ALP 312, AST 121, ALT 277, total bilirubin 6.4, WBC 10.7, lactate 2.67, lipase 644, positive urinalysis with more moderate leukocyte and positive nitrate, temperature 99.8, tachycardia, tachypnea, electrolytes and renal function okay, negative chest x-ray. Patient is admitted to telemetry bed as inpatient. GI, Dr. Ardis Hughs was consulted by EDP.  # CT abdomen/pelvis showed: No evidence of bowel obstruction.  Normal appendix. Extensive sigmoid diverticulosis, without evidence of diverticulitis. Mildly thick-walled bladder, correlate for cystitis. Associated wall thickening/enhancement involving the left proximal collecting system, possibly reflecting ascending  infection. Prostatomegaly, suggesting BPH. Liver is within normal limits. Status post cholecystectomy. No intrahepatic or extrahepatic ductal dilatation. Pancrease is within normal limits   Review of Systems:   General: no fevers, chills, no changes in body weight, has poor appetite, has fatigue HEENT: no blurry vision, hearing changes or sore throat. Has jaundice  Respiratory: no dyspnea, coughing, wheezing CV: no chest pain, no palpitations GI: has nausea, vomiting, abdominal pain, no diarrhea, constipation GU: has dysuria, burning on urination, increased urinary frequency, no hematuria  Ext: no leg edema Neuro: no unilateral weakness, numbness, or tingling, no vision change or hearing loss Skin: no rash, no skin tear MSK: No muscle spasm, no deformity, no limitation of range of movement in spin Heme: No easy bruising.  Travel history: No recent long distant travel.  Allergy:  Allergies  Allergen Reactions  . Penicillins Other (See Comments)    [place], passed out Tolerates cephalosporins  . Sulfamethoxazole-Trimethoprim Rash  . Sulfonamide Derivatives Rash    Past Medical History:  Diagnosis Date  . Abnormal LFTs   . B12 deficiency   . CHF (congestive heart failure) ([place])   . Choledocholithiasis with obstruction 07/02/2013  . Cluster headache   . Coronary atherosclerosis of unspecified type of vessel, native or graft   . Degeneration of intervertebral disc, site unspecified   . Diverticulosis of colon (without mention of hemorrhage)   . Hyperlipidemia   . Hypertension   . Hypertrophy of prostate without urinary obstruction and other lower urinary tract symptoms (LUTS)   . Monocytosis 01/01/2014  . Nephrolithiasis   . Neurogenic dysphagia   . Osteoarthritis   . Pernicious anemia   . Personal history of colonic polyps  adenomatous  . Prostate enlargement    urinary frequency, nocturia  . Thrombocytopenia, unspecified ([place]) 09/01/2012  . Unspecified sleep apnea     does not have cpap-was not able to tolerate    Past Surgical History:  Procedure Laterality Date  . BIOPSY PROSTATE  2008   Benign  . BONE MARROW BIOPSY  2011  . CHOLECYSTECTOMY  11/24/2010   Procedure: LAPAROSCOPIC CHOLECYSTECTOMY WITH INTRAOPERATIVE CHOLANGIOGRAM;  Surgeon: Earnstine Regal, MD;  Location: [facility];  Service: General;  Laterality: N/A;  c-arm  . ERCP N/A 07/04/2013   Procedure: ENDOSCOPIC RETROGRADE CHOLANGIOPANCREATOGRAPHY (ERCP);  Surgeon: Gatha Mayer, MD;  Location: [facility];  Service: Endoscopy;  Laterality: N/A;  MAC if available  . HERNIA REPAIR  2004   RIH  . inguinal herniorrhapy  2003  . KNEE ARTHROSCOPY     5 times  . NASAL ENDOSCOPY  2012  . ROTATOR CUFF REPAIR Right 2006  . TONSILLECTOMY    . TOTAL HIP ARTHROPLASTY Right 2011  . TOTAL KNEE ARTHROPLASTY  2003  . UVULECTOMY  1993    Social History:  reports that he quit smoking about 49 years ago. His smoking use included Pipe. He has never used smokeless tobacco. He reports that he does not drink alcohol or use drugs.  Family History:  Family History  Problem Relation Age of Onset  . Colon cancer Father 35  . Peripheral vascular disease Mother      Prior to Admission medications   Medication Sig Start Date End Date Taking? Authorizing Provider  azithromycin (ZITHROMAX) 250 MG tablet Take 250 mg by mouth daily. ABT Start Date 10/07/15 & End Date 10/11/15. 10/08/15  Yes Historical Provider, MD  clobetasol cream (TEMOVATE) 8.18 % Apply 1 application topically 2 (two) times daily. For whelps/itching   Yes Historical Provider, MD  co-enzyme Q-10 30 MG capsule Take 200 mg by mouth daily.   Yes Historical Provider, MD  cyanocobalamin (,VITAMIN B-12,) 1000 MCG/ML injection Inject 1 mL (1,000 mcg total) into the muscle every 30 (thirty) days. 03/27/14  Yes Lucille Passy, MD  dextromethorphan-guaiFENesin [facility] DM) 30-600 MG 12hr tablet Take 1 tablet by mouth 2 (two) times daily as needed for cough.   Yes  Historical Provider, MD  doxycycline (VIBRA-TABS) 100 MG tablet Take 1 tablet (100 mg total) by mouth 2 (two) times daily. 03/25/15  Yes Venia Carbon, MD  metoprolol succinate (TOPROL-XL) 25 MG 24 hr tablet Take 1 tablet (25 mg total) by mouth daily. 12/04/14  Yes Lelon Perla, MD  mometasone (NASONEX) 50 MCG/ACT nasal spray Place 2 sprays into the nose daily as needed (congestion).    Yes Historical Provider, MD  Omega-3 Fatty Acids (FISH OIL) 1200 MG CAPS Take 1,200 mg by mouth daily.   Yes Historical Provider, MD  ondansetron (ZOFRAN-ODT) 4 MG disintegrating tablet Take 4 mg by mouth every 8 (eight) hours as needed for nausea or vomiting.   Yes Historical Provider, MD  Tamsulosin HCl (FLOMAX) 0.4 MG CAPS Take 0.4 mg by mouth daily after supper.   Yes Historical Provider, MD  calcium carbonate (TUMS - DOSED IN MG ELEMENTAL CALCIUM) 500 MG chewable tablet Chew 2 tablets by mouth daily as needed for indigestion.     Historical Provider, MD  nitroGLYCERIN (NITROSTAT) 0.4 MG SL tablet Place 1 tablet (0.4 mg total) under the tongue every 5 (five) minutes as needed. Up to 3 doses 01/09/13   Burtis Junes, NP    Physical Exam:  Vitals:   10/10/15 2209 10/11/15 0035 10/11/15 0142 10/11/15 0450  BP: 134/67 110/70  129/62  Pulse: 86 77  93  Resp: _0 Temp:    99.2 F (37.3 C)  TempSrc:    Oral  SpO2: 95% 95%  98%  Weight:   62.8 kg (138 lb 8 oz)   Height:   5' (1.524 m)    General: Not in acute distress HEENT:       Eyes: PERRL, EOMI, has scleral icterus.       ENT: No discharge from the ears and nose, no pharynx injection, no tonsillar enlargement.        Neck: No JVD, no bruit, no mass felt. Heme: No neck lymph node enlargement. Cardiac: S1/S2, RRR, No murmurs, No gallops or rubs. Respiratory: No rales, wheezing, rhonchi or rubs. GI: distended, diffused tenderness, no rebound pain, no organomegaly, BS present. GU: No hematuria Ext: No pitting leg edema bilaterally. 2+DP/PT  pulse bilaterally. Musculoskeletal: No joint deformities, No joint redness or warmth, no limitation of ROM in spin. Skin: No rashes.  Neuro: Alert, oriented X3, cranial nerves II-XII grossly intact, moves all extremities normally. Psych: Patient is not psychotic, no suicidal or hemocidal ideation.  Labs on Admission: I have personally reviewed following labs and imaging studies  CBC:  Recent Labs Lab 10/10/15 1838  WBC 10.7*  HGB 15.5  HCT 46.1  MCV 83.8  PLT 542   Basic Metabolic Panel:  Recent Labs Lab 10/10/15 1838  NA 139  K 4.1  CL 105  CO2 26  GLUCOSE 172*  BUN 9  CREATININE 0.77  CALCIUM 9.0   GFR: Estimated Creatinine Clearance: 57.4 mL/min (by C-G formula based on SCr of 0.77 mg/dL). Liver Function Tests:  Recent Labs Lab 10/10/15 1838  AST 121*  ALT 277*  ALKPHOS 312*  BILITOT 6.1*  PROT 6.4*  ALBUMIN 3.7    Recent Labs Lab 10/10/15 1838  LIPASE 644*   No results for input(s): AMMONIA in the last 168 hours. Coagulation Profile:  Recent Labs Lab 10/11/15 0249  INR 1.18   Cardiac Enzymes: No results for input(s): CKTOTAL, CKMB, CKMBINDEX, TROPONINI in the last 168 hours. BNP (last 3 results) No results for input(s): PROBNP in the last 8760 hours. HbA1C: No results for input(s): HGBA1C in the last 72 hours. CBG: No results for input(s): GLUCAP in the last 168 hours. Lipid Profile: No results for input(s): CHOL, HDL, LDLCALC, TRIG, CHOLHDL, LDLDIRECT in the last 72 hours. Thyroid Function Tests: No results for input(s): TSH, T4TOTAL, FREET4, T3FREE, THYROIDAB in the last 72 hours. Anemia Panel: No results for input(s): VITAMINB12, FOLATE, FERRITIN, TIBC, IRON, RETICCTPCT in the last 72 hours. Urine analysis:    Component Value Date/Time   COLORURINE AMBER (A) 10/10/2015 1800   APPEARANCEUR CLOUDY (A) 10/10/2015 1800   LABSPEC 1.020 10/10/2015 1800   PHURINE 5.5 10/10/2015 1800   GLUCOSEU NEGATIVE 10/10/2015 1800   HGBUR SMALL  (A) 10/10/2015 1800   HGBUR negative 06/02/2009 1059   BILIRUBINUR LARGE (A) 10/10/2015 1800   BILIRUBINUR 1+ 11/01/2014 1512   KETONESUR 15 (A) 10/10/2015 1800   PROTEINUR NEGATIVE 10/10/2015 1800   UROBILINOGEN negative 11/01/2014 1512   UROBILINOGEN 1.0 07/01/2013 0450   NITRITE POSITIVE (A) 10/10/2015 1800   LEUKOCYTESUR MODERATE (A) 10/10/2015 1800   Sepsis Labs: _1 (procalcitonin:4,lacticidven:4) )No results found for this or any previous visit (from the past 240 hour(s)).   Radiological Exams on Admission: Dg Chest 2 View  Result Date: 10/10/2015 CLINICAL DATA:  Abdominal pain, vomiting and cough for 4 days, on antibiotics. Abdominal pain. History of hypertension, hyperlipidemia. EXAM: CHEST  2 VIEW COMPARISON:  Chest radiograph January 31, 2015 FINDINGS: The cardiac silhouette is mildly enlarged, mediastinal silhouette is unremarkable. Mildly calcified aortic knob. No pleural effusions or focal consolidations. Trachea projects midline and there is no pneumothorax. Soft tissue planes and included osseous structures are non-suspicious. Thoracolumbar levoscoliosis. Moderate degenerative change of thoracic spine. Severe LEFT glenohumeral osteoarthrosis and loose bodies. IMPRESSION: Mild cardiomegaly, no acute pulmonary process. Electronically Signed   By: Elon Alas M.D.   On: 10/10/2015 23:20   Ct Abdomen Pelvis W Contrast  Result Date: 10/10/2015 CLINICAL DATA:  Abdominal pain, vomiting, cough. EXAM: CT ABDOMEN AND PELVIS WITH CONTRAST TECHNIQUE: Multidetector CT imaging of the abdomen and pelvis was performed using the standard protocol following bolus administration of intravenous contrast. CONTRAST:  177m ISOVUE-300 IOPAMIDOL (ISOVUE-300) INJECTION 61% COMPARISON:  CT abdomen pelvis dated 06/14 715 FINDINGS: Lower chest: Lung bases are essentially clear. Hepatobiliary: Liver is within normal limits. Status post cholecystectomy. No intrahepatic or extrahepatic ductal  dilatation. Pancreas: Within normal limits. Spleen: Within normal limits. Adrenals/Urinary Tract: Adrenal glands are within normal limits. Kidneys are notable for bilateral renal sinus cysts. Mild urothelial thickening/enhancement involving the left proximal collecting system (series 2/ image 29). No frank hydronephrosis. Mild wall thickening involving the bladder (series 2/image 60). Stomach/Bowel: Stomach is notable for a tiny hiatal hernia. No evidence of bowel obstruction. Normal appendix (series 2/ image 43). Extensive sigmoid diverticulosis, without evidence of diverticulitis. Vascular/Lymphatic: Atherosclerotic calcifications of the abdominal aorta and branch vessels. No evidence of abdominal aortic aneurysm. No suspicious abdominopelvic lymphadenopathy. Reproductive: Prostatomegaly, with enlargement the central gland which indents the base of the bladder. Other: No abdominopelvic ascites. Small fat containing left inguinal hernia (series 2/image 67). Musculoskeletal: Right hip arthroplasty, without evidence of complication. Moderate degenerative changes of the visualized thoracolumbar spine, with associated lumbar dextroscoliosis. IMPRESSION: No evidence of bowel obstruction.  Normal appendix. Extensive sigmoid diverticulosis, without evidence of diverticulitis. Mildly thick-walled bladder, correlate for cystitis. Associated wall thickening/enhancement involving the left proximal collecting system, possibly reflecting ascending infection. Prostatomegaly, suggesting BPH. Additional ancillary findings as above. Electronically Signed   By: SJulian HyM.D.   On: 10/10/2015 22:33     EKG: Independently reviewed. Sinus rhythm, QTC 421, LAD, poor R-wave progression, low voltage.   Assessment/Plan Principal Problem:   Abdominal pain Active Problems:   Coronary atherosclerosis   DNR (do not resuscitate)   Choledocholithiasis with obstruction   Abnormal LFTs   Cough   UTI (lower urinary tract  infection)   Nausea with vomiting   Sepsis ([place]   Pancreatitis   Abdominal pain and sepsis: Patient has abnormal liver function with total bilirubin 6.4, lipase 644, indicating possible choledocholithiasis with obstruction. Pt may have ascending cholangitis given leukocytosis and sepsis (ongoing respiratory infection and UTI may have also contributed to sepsis). GI, Dr. JArdis Hughswas consulted with EDP. Currently hemodynamically stable. Lactic acid is trending down, 2.67, 1.50, 1.42  -will admit to tele bed -Patient received 1 dose of vancomycin, cefepime and Levaquin--> will continue cefepime only -When necessary Zofran for nausea, morphine and oxycodone for pain -Abdominal ultrasound for further evaluation  -Follow-up with GI recommendations -Check direct bilirubin and hepatitis panel -will get Procalcitonin and trend lactic acid levels per sepsis protocol. -IVF: 2.5L of NS bolus in ED, followed by 100 cc/h  -f/u Bx   Coronary atherosclerosis: No CP. -  Continue metoprolol, when necessary nitroglycerin  Cough: Chest x-ray negative. Possible bronchitis. -Left patient to complete Z-Pak, one more dose today -When necessary Mucinex for cough  UTI: -on cefepime -Follow-up urine culture  Pancreatitis: Secondary to possible choledocholithiasis with obstruction. -IV fluid as above -Symptomatic treatment as above  HTN: -Continue metoprolol  BPH: stable - Continue Flomax  Pernicious anemia: Hemoglobin 15.5. Patient is due for B12 injection. -Give one dose of B12 injection, 1000 g 1   DVT ppx: SCD Code Status: Full code Family Communication: Yes, patient's 2 daughters at bed side Disposition Plan:  Anticipate discharge back to previous home environment Consults called:  GI, Dr. Ardis Hughs Admission status:Inpatient/tele  Date of Service 10/11/2015    [name] [facility] Pager [phone]  If 7PM-7AM, please contact night-coverage [email or web address] Password  [facility] 10/11/2015, 5:44 AM

## 2015-10-12 ENCOUNTER — Encounter (HOSPITAL_COMMUNITY): Payer: Self-pay | Admitting: Anesthesiology

## 2015-10-12 ENCOUNTER — Inpatient Hospital Stay (HOSPITAL_COMMUNITY): Payer: Medicare Other | Admitting: Anesthesiology

## 2015-10-12 ENCOUNTER — Encounter (HOSPITAL_COMMUNITY)
Admission: EM | Disposition: A | Discharge status: Home / Self Care | Payer: Self-pay | Source: Home / Self Care | Attending: Internal Medicine

## 2015-10-12 ENCOUNTER — Inpatient Hospital Stay (HOSPITAL_COMMUNITY): Payer: Medicare Other

## 2015-10-12 DIAGNOSIS — R17 Unspecified jaundice: Secondary | ICD-10-CM

## 2015-10-12 DIAGNOSIS — K8043 Calculus of bile duct with acute cholecystitis with obstruction: Secondary | ICD-10-CM

## 2015-10-12 HISTORY — PX: ERCP: SHX5425

## 2015-10-12 LAB — COMPREHENSIVE METABOLIC PANEL
ALK PHOS: 300 U/L — AB (ref 38–126)
ALT: 183 U/L — ABNORMAL HIGH (ref 17–63)
ANION GAP: 6 (ref 5–15)
AST: 123 U/L — ABNORMAL HIGH (ref 15–41)
Albumin: 2.6 g/dL — ABNORMAL LOW (ref 3.5–5.0)
BUN: 5 mg/dL — ABNORMAL LOW (ref 6–20)
CALCIUM: 7.8 mg/dL — AB (ref 8.9–10.3)
CO2: 22 mmol/L (ref 22–32)
Chloride: 110 mmol/L (ref 101–111)
Creatinine, Ser: 0.62 mg/dL (ref 0.61–1.24)
Glucose, Bld: 115 mg/dL — ABNORMAL HIGH (ref 65–99)
Potassium: 3.3 mmol/L — ABNORMAL LOW (ref 3.5–5.1)
SODIUM: 138 mmol/L (ref 135–145)
TOTAL PROTEIN: 4.6 g/dL — AB (ref 6.5–8.1)
Total Bilirubin: 6 mg/dL — ABNORMAL HIGH (ref 0.3–1.2)

## 2015-10-12 LAB — URINE CULTURE: CULTURE: NO GROWTH

## 2015-10-12 LAB — HEPATITIS PANEL, ACUTE
HCV Ab: <0.1 s/co ratio (ref 0.0–0.9)
HEP B C IGM: NEGATIVE
HEP B S AG: NEGATIVE
Hep A IgM: NEGATIVE

## 2015-10-12 LAB — CBC
HCT: 36.7 % — ABNORMAL LOW (ref 39.0–52.0)
HEMOGLOBIN: 12.4 g/dL — AB (ref 13.0–17.0)
MCH: 27.7 pg (ref 26.0–34.0)
MCHC: 33.8 g/dL (ref 30.0–36.0)
MCV: 81.9 fL (ref 78.0–100.0)
Platelets: 131 10*3/uL — ABNORMAL LOW (ref 150–400)
RBC: 4.48 MIL/uL (ref 4.22–5.81)
RDW: 15.1 % (ref 11.5–15.5)
WBC: 5.7 10*3/uL (ref 4.0–10.5)

## 2015-10-12 LAB — GLUCOSE, CAPILLARY: GLUCOSE-CAPILLARY: 136 mg/dL — AB (ref 65–99)

## 2015-10-12 LAB — LIPASE, BLOOD: Lipase: 51 U/L (ref 11–51)

## 2015-10-12 SURGERY — ERCP, WITH INTERVENTION IF INDICATED
Anesthesia: General

## 2015-10-12 MED ORDER — INDOMETHACIN 50 MG RE SUPP
RECTAL | Status: DC | PRN
  Administered 2015-10-12: 100 mg via RECTAL

## 2015-10-12 MED ORDER — FENTANYL CITRATE (PF) 100 MCG/2ML IJ SOLN: 25.0000 ug | INTRAMUSCULAR | Status: DC | PRN

## 2015-10-12 MED ORDER — IOPAMIDOL (ISOVUE-300) INJECTION 61%
INTRAVENOUS | Status: DC | PRN
  Administered 2015-10-12: 25 mL via INTRAVENOUS

## 2015-10-12 MED ORDER — PROPOFOL 10 MG/ML IV BOLUS
INTRAVENOUS | Status: DC | PRN
  Administered 2015-10-12: 150 mg via INTRAVENOUS

## 2015-10-12 MED ORDER — LIDOCAINE 2% (20 MG/ML) 5 ML SYRINGE
INTRAMUSCULAR | Status: DC | PRN
  Administered 2015-10-12: 100 mg via INTRAVENOUS

## 2015-10-12 MED ORDER — SODIUM CHLORIDE 0.9 % IV SOLN: INTRAVENOUS | Status: DC

## 2015-10-12 MED ORDER — ONDANSETRON HCL 4 MG/2ML IJ SOLN
INTRAMUSCULAR | Status: AC
  Filled 2015-10-12: qty 2

## 2015-10-12 MED ORDER — PROPOFOL 10 MG/ML IV BOLUS
INTRAVENOUS | Status: AC
  Filled 2015-10-12: qty 20

## 2015-10-12 MED ORDER — GLUCAGON HCL RDNA (DIAGNOSTIC) 1 MG IJ SOLR
INTRAMUSCULAR | Status: AC
  Filled 2015-10-12: qty 1

## 2015-10-12 MED ORDER — LIDOCAINE 2% (20 MG/ML) 5 ML SYRINGE
INTRAMUSCULAR | Status: AC
  Filled 2015-10-12: qty 5

## 2015-10-12 MED ORDER — PROMETHAZINE HCL 25 MG/ML IJ SOLN: 6.2500 mg | INTRAMUSCULAR | Status: DC | PRN

## 2015-10-12 MED ORDER — FENTANYL CITRATE (PF) 100 MCG/2ML IJ SOLN
INTRAMUSCULAR | Status: AC
  Filled 2015-10-12: qty 2

## 2015-10-12 MED ORDER — PHENYLEPHRINE 40 MCG/ML (10ML) SYRINGE FOR IV PUSH (FOR BLOOD PRESSURE SUPPORT)
PREFILLED_SYRINGE | INTRAVENOUS | Status: AC
  Filled 2015-10-12: qty 10

## 2015-10-12 MED ORDER — INDOMETHACIN 50 MG RE SUPP
RECTAL | Status: AC
  Filled 2015-10-12: qty 2

## 2015-10-12 MED ORDER — GLUCAGON HCL RDNA (DIAGNOSTIC) 1 MG IJ SOLR
INTRAMUSCULAR | Status: DC | PRN
  Administered 2015-10-12: .5 mg via INTRAVENOUS

## 2015-10-12 MED ORDER — FENTANYL CITRATE (PF) 100 MCG/2ML IJ SOLN
INTRAMUSCULAR | Status: DC | PRN
  Administered 2015-10-12 (×2): 50 ug via INTRAVENOUS

## 2015-10-12 MED ORDER — ONDANSETRON HCL 4 MG/2ML IJ SOLN
INTRAMUSCULAR | Status: DC | PRN
  Administered 2015-10-12: 4 mg via INTRAVENOUS

## 2015-10-12 MED ORDER — IOPAMIDOL (ISOVUE-300) INJECTION 61%
INTRAVENOUS | Status: AC
  Filled 2015-10-12: qty 50

## 2015-10-12 MED ORDER — SUCCINYLCHOLINE CHLORIDE 200 MG/10ML IV SOSY
PREFILLED_SYRINGE | INTRAVENOUS | Status: DC | PRN
  Administered 2015-10-12: 100 mg via INTRAVENOUS

## 2015-10-12 MED ORDER — LACTATED RINGERS IV SOLN
INTRAVENOUS | Status: DC | PRN
  Administered 2015-10-12: 07:00:00 via INTRAVENOUS

## 2015-10-12 NOTE — Progress Notes (Signed)
PROGRESS NOTE    [name]  [id number] DOB: 1933/06/26 DOA: 10/10/2015 PCP: Arnette Norris, MD   Outpatient Specialists:    Brief Narrative:  [name] is a 82 y.o. male with medical history significant of hypertension, hyperlipidemia, pernicious anemia, BPH, CAD, s/p of cholecystectomy, choledocholithiasis, who presents with abdominal pain, jaundice, dysuria, cough.  Patient states that he has abdominal pain the past 3 days. It is associated with nausea, vomiting and abdominal distention. No diarrhea. He was noted to have yellow eyes by her daughter. He vomited twice today. His abdominal pain is located in the umbilical area, constant, sharp, 8 out of 10 in severity, nonradiating. It is aggravated by eating food.  Patient also has dysuria, and burning on urination for 2 days. He states that he has been coughing with yellow colored sputum production for 7 days. No fever, chills, shortness of breath. He has mild chest pain which is induced by coughing only. He has been taking Z-Pak for 4 days, has 1 more pill left.     Assessment & Plan:   Principal Problem:   Abdominal pain Active Problems:   Coronary atherosclerosis   DNR (do not resuscitate)   Choledocholithiasis with obstruction   Abnormal LFTs   Cough   UTI (lower urinary tract infection)   Nausea with vomiting   Sepsis ([place])   Pancreatitis   Jaundice   Abdominal pain and sepsis:  -abnormal liver function with total bilirubin 6.4, lipase 644, indicating possible choledocholithiasis with obstruction.  -s/p ERCP--- no stone seen-- ? If passed, ? Medication related -on maxipime  Coronary atherosclerosis: No CP. -Continue metoprolol, when necessary nitroglycerin  Cough: Chest x-ray negative. Possible bronchitis. -Left patient to complete Z-Pak, one more dose today -When necessary Mucinex for cough  UTI: -on cefepime -Follow-up urine culture- NGTD Blood cultures also NGTD  Pancreatitis:  Secondary to possible choledocholithiasis with obstruction. -IV fluid as above -Symptomatic treatment as above Eating well  HTN: -Continue metoprolol  BPH: stable - Continue Flomax  Pernicious anemia: Hemoglobin 15.5. Patient is due for B12 injection. -Give one dose of B12 injection, 1000 g 1   DVT prophylaxis:  SCD's  Code Status: DNR   Family Communication: Multiple at bedside  Disposition Plan:  Home 1-2 days   Consultants:  GI    Subjective: Feeling great, no complaints  Objective: Vitals:   10/12/15 0800 10/12/15 0815 10/12/15 0830 10/12/15 0853  BP: 127/69 (!) 147/84 138/81 (!) 145/73  Pulse: 90 82 86 84  Resp: 20 17 20 18   Temp: 98.3 F (36.8 C)  98.3 F (36.8 C) 98.1 F (36.7 C)  TempSrc:      SpO2: 100% 100% 99% 100%  Weight:      Height:        Intake/Output Summary (Last 24 hours) at 10/12/15 1309 Last data filed at 10/12/15 0830  Gross per 24 hour  Intake             1825 ml  Output             2275 ml  Net             -450 ml   Filed Weights   10/10/15 2013 10/11/15 0142  Weight: 63 kg (139 lb) 62.8 kg (138 lb 8 oz)    Examination:  General exam: Appears calm and comfortable  Respiratory system: Clear to auscultation. Respiratory effort normal. Cardiovascular system: S1 & S2 heard, RRR. No JVD, murmurs, rubs, gallops or clicks.  No pedal edema. Gastrointestinal system: Abdomen is nondistended, soft and nontender. No organomegaly or masses felt. Normal bowel sounds heard.   Data Reviewed: I have personally reviewed following labs and imaging studies  CBC:  Recent Labs Lab 10/10/15 1838 10/11/15 0535 10/12/15 0525  WBC 10.7* 6.3 5.7  HGB 15.5 12.9* 12.4*  HCT 46.1 38.3* 36.7*  MCV 83.8 81.8 81.9  PLT 179 143* [id number]*   Basic Metabolic Panel:  Recent Labs Lab 10/10/15 1838 10/11/15 0535 10/12/15 0525  NA 139 140 138  K 4.1 3.5 3.3*  CL 105 112* 110  CO2 26 21* 22  GLUCOSE 172* 100* 115*  BUN 9 6 5*    CREATININE 0.77 0.59* 0.62  CALCIUM 9.0 7.9* 7.8*   GFR: Estimated Creatinine Clearance: 57.4 mL/min (by C-G formula based on SCr of 0.62 mg/dL). Liver Function Tests:  Recent Labs Lab 10/10/15 1838 10/11/15 0535 10/12/15 0525  AST 121* 103* 123*  ALT 277* 208* 183*  ALKPHOS 312* 246* 300*  BILITOT 6.1* 6.2* 6.0*  PROT 6.4* 4.8* 4.6*  ALBUMIN 3.7 2.8* 2.6*    Recent Labs Lab 10/10/15 1838 10/11/15 0836 10/12/15 0525  LIPASE 644* 36 51   No results for input(s): AMMONIA in the last 168 hours. Coagulation Profile:  Recent Labs Lab 10/11/15 0249  INR 1.18   Cardiac Enzymes: No results for input(s): CKTOTAL, CKMB, CKMBINDEX, TROPONINI in the last 168 hours. BNP (last 3 results) No results for input(s): PROBNP in the last 8760 hours. HbA1C: No results for input(s): HGBA1C in the last 72 hours. CBG:  Recent Labs Lab 10/11/15 0758 10/12/15 0904  GLUCAP 97 136*   Lipid Profile: No results for input(s): CHOL, HDL, LDLCALC, TRIG, CHOLHDL, LDLDIRECT in the last 72 hours. Thyroid Function Tests: No results for input(s): TSH, T4TOTAL, FREET4, T3FREE, THYROIDAB in the last 72 hours. Anemia Panel: No results for input(s): VITAMINB12, FOLATE, FERRITIN, TIBC, IRON, RETICCTPCT in the last 72 hours. Urine analysis:    Component Value Date/Time   COLORURINE AMBER (A) 10/10/2015 1800   APPEARANCEUR CLOUDY (A) 10/10/2015 1800   LABSPEC 1.020 10/10/2015 1800   PHURINE 5.5 10/10/2015 1800   GLUCOSEU NEGATIVE 10/10/2015 1800   HGBUR SMALL (A) 10/10/2015 1800   HGBUR negative 06/02/2009 1059   BILIRUBINUR LARGE (A) 10/10/2015 1800   BILIRUBINUR 1+ 11/01/2014 1512   KETONESUR 15 (A) 10/10/2015 1800   PROTEINUR NEGATIVE 10/10/2015 1800   UROBILINOGEN negative 11/01/2014 1512   UROBILINOGEN 1.0 07/01/2013 0450   NITRITE POSITIVE (A) 10/10/2015 1800   LEUKOCYTESUR MODERATE (A) 10/10/2015 1800      Recent Results (from the past 240 hour(s))  Urine culture     Status:  Abnormal (Preliminary result)   Collection Time: 10/10/15  6:00 PM  Result Value Ref Range Status   Specimen Description URINE, CLEAN CATCH  Final   Special Requests NONE  Final   Culture >=100,000 COLONIES/mL ESCHERICHIA COLI (A)  Final   Report Status PENDING  Incomplete  Blood Culture (routine x 2)     Status: None (Preliminary result)   Collection Time: 10/10/15  8:13 PM  Result Value Ref Range Status   Specimen Description BLOOD LEFT FOREARM  Final   Special Requests BOTTLES DRAWN AEROBIC AND ANAEROBIC 5CC  Final   Culture NO GROWTH < 24 HOURS  Final   Report Status PENDING  Incomplete  Blood Culture (routine x 2)     Status: None (Preliminary result)   Collection Time: 10/10/15  8:36 PM  Result Value Ref Range Status   Specimen Description BLOOD BLOOD RIGHT WRIST  Final   Special Requests BOTTLES DRAWN AEROBIC AND ANAEROBIC 5CC  Final   Culture NO GROWTH < 24 HOURS  Final   Report Status PENDING  Incomplete  Urine culture     Status: None   Collection Time: 10/11/15  3:30 AM  Result Value Ref Range Status   Specimen Description URINE, RANDOM  Final   Special Requests NONE  Final   Culture NO GROWTH Performed at [facility]   Final   Report Status 10/12/2015 FINAL  Final      Anti-infectives    Start     Dose/Rate Route Frequency Ordered Stop   10/11/15 2100  azithromycin (ZITHROMAX) tablet 250 mg  Status:  Discontinued    Comments:  ABT Start Date 10/07/15 & End Date 10/11/15.     250 mg Oral Daily 10/11/15 0236 10/11/15 0906   10/11/15 2000  levofloxacin (LEVAQUIN) IVPB 750 mg  Status:  Discontinued     750 mg 100 mL/hr over 90 Minutes Intravenous Every 24 hours 10/10/15 2039 10/11/15 0236   10/11/15 0800  vancomycin (VANCOCIN) 500 mg in sodium chloride 0.9 % 100 mL IVPB  Status:  Discontinued     500 mg 100 mL/hr over 60 Minutes Intravenous Every 12 hours 10/10/15 2039 10/11/15 0236   10/10/15 2045  ceFEPIme (MAXIPIME) 2 g in dextrose 5 % 50 mL IVPB     2  g 100 mL/hr over 30 Minutes Intravenous 2 times daily 10/10/15 2039     10/10/15 2015  levofloxacin (LEVAQUIN) IVPB 750 mg     750 mg 100 mL/hr over 90 Minutes Intravenous  Once 10/10/15 2004 10/10/15 2216   10/10/15 2015  aztreonam (AZACTAM) 2 g in dextrose 5 % 50 mL IVPB  Status:  Discontinued     2 g 100 mL/hr over 30 Minutes Intravenous  Once 10/10/15 2004 10/10/15 2039   10/10/15 2015  vancomycin (VANCOCIN) IVPB 1000 mg/200 mL premix     1,000 mg 200 mL/hr over 60 Minutes Intravenous  Once 10/10/15 2004 10/10/15 2216       Radiology Studies: Dg Chest 2 View  Result Date: 10/10/2015 CLINICAL DATA:  Abdominal pain, vomiting and cough for 4 days, on antibiotics. Abdominal pain. History of hypertension, hyperlipidemia. EXAM: CHEST  2 VIEW COMPARISON:  Chest radiograph January 31, 2015 FINDINGS: The cardiac silhouette is mildly enlarged, mediastinal silhouette is unremarkable. Mildly calcified aortic knob. No pleural effusions or focal consolidations. Trachea projects midline and there is no pneumothorax. Soft tissue planes and included osseous structures are non-suspicious. Thoracolumbar levoscoliosis. Moderate degenerative change of thoracic spine. Severe LEFT glenohumeral osteoarthrosis and loose bodies. IMPRESSION: Mild cardiomegaly, no acute pulmonary process. Electronically Signed   By: Elon Alas M.D.   On: 10/10/2015 23:20   US Abdomen Complete  Result Date: 10/11/2015 CLINICAL DATA:  Abdominal pain for the past 4 days. Previous cholecystectomy. EXAM: ABDOMEN ULTRASOUND COMPLETE COMPARISON:  Abdomen CT obtained yesterday. FINDINGS: Gallbladder: Surgically absent. Common bile duct: Diameter: 5.1 mm. Liver: No focal lesion identified. Within normal limits in parenchymal echogenicity. IVC: No abnormality visualized. Pancreas: Visualized portion unremarkable. Spleen: Size and appearance within normal limits. Right Kidney: Length: 11.6 cm. Normal echogenicity. Diffuse cortical  thinning with prominent renal sinus fat. Left Kidney: Length: 12.0 cm. Normal echogenicity. Mild diffuse cortical thinning. 1.8 cm parapelvic cyst. Abdominal aorta: No aneurysm visualized. Other findings: None. IMPRESSION: 1. No acute abnormality. 2. Mild  diffuse bilateral renal cortical atrophy. Electronically Signed   By: Claudie Revering M.D.   On: 10/11/2015 10:49   Ct Abdomen Pelvis W Contrast  Result Date: 10/10/2015 CLINICAL DATA:  Abdominal pain, vomiting, cough. EXAM: CT ABDOMEN AND PELVIS WITH CONTRAST TECHNIQUE: Multidetector CT imaging of the abdomen and pelvis was performed using the standard protocol following bolus administration of intravenous contrast. CONTRAST:  136mL ISOVUE-300 IOPAMIDOL (ISOVUE-300) INJECTION 61% COMPARISON:  CT abdomen pelvis dated 06/14 715 FINDINGS: Lower chest: Lung bases are essentially clear. Hepatobiliary: Liver is within normal limits. Status post cholecystectomy. No intrahepatic or extrahepatic ductal dilatation. Pancreas: Within normal limits. Spleen: Within normal limits. Adrenals/Urinary Tract: Adrenal glands are within normal limits. Kidneys are notable for bilateral renal sinus cysts. Mild urothelial thickening/enhancement involving the left proximal collecting system (series 2/ image 29). No frank hydronephrosis. Mild wall thickening involving the bladder (series 2/image 60). Stomach/Bowel: Stomach is notable for a tiny hiatal hernia. No evidence of bowel obstruction. Normal appendix (series 2/ image 43). Extensive sigmoid diverticulosis, without evidence of diverticulitis. Vascular/Lymphatic: Atherosclerotic calcifications of the abdominal aorta and branch vessels. No evidence of abdominal aortic aneurysm. No suspicious abdominopelvic lymphadenopathy. Reproductive: Prostatomegaly, with enlargement the central gland which indents the base of the bladder. Other: No abdominopelvic ascites. Small fat containing left inguinal hernia (series 2/image 67).  Musculoskeletal: Right hip arthroplasty, without evidence of complication. Moderate degenerative changes of the visualized thoracolumbar spine, with associated lumbar dextroscoliosis. IMPRESSION: No evidence of bowel obstruction.  Normal appendix. Extensive sigmoid diverticulosis, without evidence of diverticulitis. Mildly thick-walled bladder, correlate for cystitis. Associated wall thickening/enhancement involving the left proximal collecting system, possibly reflecting ascending infection. Prostatomegaly, suggesting BPH. Additional ancillary findings as above. Electronically Signed   By: Julian Hy M.D.   On: 10/10/2015 22:33   Dg Ercp  Result Date: 10/12/2015 CLINICAL DATA:  ERCP for common bile stone; EXAM: ERCP TECHNIQUE: Multiple spot images obtained with the fluoroscopic device and submitted for interpretation post-procedure. FLUOROSCOPY TIME:  Fluoroscopy Time: 1 min 40 sec fluoro time, 9.27 mGy COMPARISON:  Ultrasound 10/11/2015 and previous FINDINGS: Single fluoroscopic spot image documents endoscopic catheterization in partial opacification of the biliary tree. The CBD is nondilated. Distal CBD is obstructed by the overlying scope. Intrahepatic ducts incompletely opacified. IMPRESSION: Endoscopic CBD intervention. These images were submitted for radiologic interpretation only. Please see the procedural report for the amount of contrast and the fluoroscopy time utilized. Electronically Signed   By: Lucrezia Europe M.D.   On: 10/12/2015 10:10        Scheduled Meds: . ceFEPime (MAXIPIME) IV  2 g Intravenous BID  . clobetasol cream  1 application Topical BID  . fluticasone  1 spray Each Nare Daily  . metoprolol succinate  25 mg Oral Daily  . omega-3 acid ethyl esters  1 g Oral Daily  . sodium chloride flush  3 mL Intravenous Q12H  . tamsulosin  0.4 mg Oral QPC supper   Continuous Infusions: . sodium chloride 100 mL/hr at 10/12/15 1201     LOS: 1 day    Time spent: 25  min    [place], DO [facility] Pager [phone]  If 7PM-7AM, please contact night-coverage [email or web address] Password [email or web address] 10/12/2015, 1:09 PM

## 2015-10-12 NOTE — H&P (View-Only) (Signed)
[facility] Consult: 8:15 AM 10/11/2015  LOS: 0 days    Referring Provider: Dr Allyson Sabal  Primary Care Physician:  Arnette Norris, MD Primary Gastroenterologist:  Dr. Carlean Purl    Reason for Consultation:  Jaundice and abd pain   HPI: [name] is a 82 y.o. male.  PMH CAD, BPH, 08/2010 cholecystectomy, pernicious anemia, and chronic thrombocytopenia. HTN.  OSA, intolerant of CPAP.  Pulmonary htn. Had bil cataract surgery in late August 2017. 07/2010 ERCP with sphincterotomy and stone removal.   06/2013 ERCP with sphincterotomy and removal of 2 CBD stones. 2003 and 12/2006 Colonoscopy.  Adenomatous polyp in 2008.   On Tuesday 9/19 started on z pack for 1 week of yellow sputum with cough and sinus drainage, this is an annual thing for him that he links to seasonal occurrence and allergies.  But on the 17th or so he had begun to have nausea and emesis.   Came from home to ED yesterday evening.  3 days of episodic post prandial abdominal pain just behind umbilicus, distention and bilious N/V.  Additionally with burning at urination and very dark urine which started 9/19.  No chills, fevers.    Current sxs not like when he had gallstones, CBD stones in past. Clinically jaundiced with Alk phos 312, AST/ALT 121/277, t bili 6.4.  Lipase 644.  U/A positive for UTI.  WBCs 10.7.  Platelets 143 K.   CT scan: thick walled urinary bladder and left collecting system.  Biliary tree not dilated an liver/pancrease unremarkable.  Sigmoid diverticulosis. Atherosclerotic calcifications of the abdominal aorta and branch vessels.  Smalll left inguinal hernia.     Previous exposure to Z pack without problems.   Weight is stable.  Generally no GI issues.     Past Medical History:  Diagnosis Date  . Abnormal LFTs   . B12 deficiency     . CHF (congestive heart failure) ([place])   . Choledocholithiasis with obstruction 07/02/2013  . Cluster headache   . Coronary atherosclerosis of unspecified type of vessel, native or graft   . Degeneration of intervertebral disc, site unspecified   . Diverticulosis of colon (without mention of hemorrhage)   . Hyperlipidemia   . Hypertension   . Hypertrophy of prostate without urinary obstruction and other lower urinary tract symptoms (LUTS)   . Monocytosis 01/01/2014  . Nephrolithiasis   . Neurogenic dysphagia   . Osteoarthritis   . Pernicious anemia   . Personal history of colonic polyps    adenomatous  . Prostate enlargement    urinary frequency, nocturia  . Thrombocytopenia, unspecified ([place]) 09/01/2012  . Unspecified sleep apnea    does not have cpap-was not able to tolerate    Past Surgical History:  Procedure Laterality Date  . BIOPSY PROSTATE  2008   Benign  . BONE MARROW BIOPSY  2011  . CHOLECYSTECTOMY  11/24/2010   Procedure: LAPAROSCOPIC CHOLECYSTECTOMY WITH INTRAOPERATIVE CHOLANGIOGRAM;  Surgeon: Earnstine Regal, MD;  Location: [facility];  Service: General;  Laterality: N/A;  c-arm  . ERCP N/A 07/04/2013  Procedure: ENDOSCOPIC RETROGRADE CHOLANGIOPANCREATOGRAPHY (ERCP);  Surgeon: Gatha Mayer, MD;  Location: [facility];  Service: Endoscopy;  Laterality: N/A;  MAC if available  . HERNIA REPAIR  2004   RIH  . inguinal herniorrhapy  2003  . KNEE ARTHROSCOPY     5 times  . NASAL ENDOSCOPY  2012  . ROTATOR CUFF REPAIR Right 2006  . TONSILLECTOMY    . TOTAL HIP ARTHROPLASTY Right 2011  . TOTAL KNEE ARTHROPLASTY  2003  . UVULECTOMY  1993    Prior to Admission medications   Medication Sig Start Date End Date Taking? Authorizing Provider  azithromycin (ZITHROMAX) 250 MG tablet Take 250 mg by mouth daily. ABT Start Date 10/07/15 & End Date 10/11/15. 10/08/15  Yes Historical Provider, MD  clobetasol cream (TEMOVATE) 2.42 % Apply 1 application topically 2 (two) times daily.  For whelps/itching   Yes Historical Provider, MD  co-enzyme Q-10 30 MG capsule Take 200 mg by mouth daily.   Yes Historical Provider, MD  cyanocobalamin (,VITAMIN B-12,) 1000 MCG/ML injection Inject 1 mL (1,000 mcg total) into the muscle every 30 (thirty) days. 03/27/14  Yes Lucille Passy, MD  dextromethorphan-guaiFENesin [facility] DM) 30-600 MG 12hr tablet Take 1 tablet by mouth 2 (two) times daily as needed for cough.   Yes Historical Provider, MD  doxycycline (VIBRA-TABS) 100 MG tablet Take 1 tablet (100 mg total) by mouth 2 (two) times daily. 03/25/15  Yes Venia Carbon, MD  metoprolol succinate (TOPROL-XL) 25 MG 24 hr tablet Take 1 tablet (25 mg total) by mouth daily. 12/04/14  Yes Lelon Perla, MD  mometasone (NASONEX) 50 MCG/ACT nasal spray Place 2 sprays into the nose daily as needed (congestion).    Yes Historical Provider, MD  Omega-3 Fatty Acids (FISH OIL) 1200 MG CAPS Take 1,200 mg by mouth daily.   Yes Historical Provider, MD  ondansetron (ZOFRAN-ODT) 4 MG disintegrating tablet Take 4 mg by mouth every 8 (eight) hours as needed for nausea or vomiting.   Yes Historical Provider, MD  Tamsulosin HCl (FLOMAX) 0.4 MG CAPS Take 0.4 mg by mouth daily after supper.   Yes Historical Provider, MD  calcium carbonate (TUMS - DOSED IN MG ELEMENTAL CALCIUM) 500 MG chewable tablet Chew 2 tablets by mouth daily as needed for indigestion.     Historical Provider, MD  nitroGLYCERIN (NITROSTAT) 0.4 MG SL tablet Place 1 tablet (0.4 mg total) under the tongue every 5 (five) minutes as needed. Up to 3 doses 01/09/13   Burtis Junes, NP    Scheduled Meds: . azithromycin  250 mg Oral Daily  . ceFEPime (MAXIPIME) IV  2 g Intravenous BID  . clobetasol cream  1 application Topical BID  . cyanocobalamin  1,000 mcg Intramuscular Once  . fluticasone  1 spray Each Nare Daily  . metoprolol succinate  25 mg Oral Daily  . omega-3 acid ethyl esters  1 g Oral Daily  . sodium chloride flush  3 mL Intravenous Q12H   . tamsulosin  0.4 mg Oral QPC supper   Infusions: . sodium chloride 100 mL/hr at 10/11/15 0322   PRN Meds: calcium carbonate, dextromethorphan-guaiFENesin, morphine injection, nitroGLYCERIN, ondansetron, oxyCODONE, zolpidem   Allergies as of 10/10/2015 - Review Complete 10/10/2015  Allergen Reaction Noted  . Penicillins Other (See Comments)   . Sulfamethoxazole-trimethoprim Rash 12/27/2008  . Sulfonamide derivatives Rash 12/22/2009    Family History  Problem Relation Age of Onset  . Colon cancer Father 58  . Peripheral vascular disease  Mother     Social History   Social History  . Marital status: Married    Spouse name: N/A  . Number of children: 2  . Years of education: N/A   Occupational History  . retired Retired   Social History Main Topics  . Smoking status: Former Smoker    Types: Pipe    Quit date: 01/18/1966  . Smokeless tobacco: Never Used  . Alcohol use No  . Drug use: No  . Sexual activity: Not on file   Other Topics Concern  . Not on file   Social History Narrative   DNR   Widowed   2 daughters leave near by    REVIEW OF SYSTEMS: Constitutional:  No issues with fatigue generally but malaise in last several days.  Does not exercise.  ENT:  No nose bleeds Pulm:  Per HPI.  No dyspnea CV:  No palpitations, no LE edema. No chest pain GU:  No hematuria, no frequency GI:  Per HPI.  No dysphagia Heme:  No issues with unusual bleeding or bruising   Transfusions:  none Neuro:  No headaches, no peripheral tingling or numbness.  No presyncope Derm:  No itching, no rash or sores.  Endocrine:  No sweats or chills.  No polyuria or dysuria Immunization:  Not queried Travel:  None beyond local counties in last few months.    PHYSICAL EXAM: Vital signs in last 24 hours: Vitals:   10/11/15 0035 10/11/15 0450  BP: 110/70 129/62  Pulse: 77 93  Resp: 18 20  Temp:  99.2 F (37.3 C)   Wt Readings from Last 3 Encounters:  10/11/15 62.8 kg (138 lb 8 oz)   03/25/15 66.3 kg (146 lb 4 oz)  03/12/15 66.3 kg (146 lb 4 oz)    General: pleasant, not jaundiced looking older but healthy looking WM.  comfortable Head:  No asymmetry or swelling  Eyes:  Slight icterus, no pallor.  EOMI Ears:  Not HOH  Nose:  No discharge Mouth:  Clear and moist.  Tongue midline Neck:  No mass or JVD Lungs:  Clear bil.  No cough or dyspnea. Heart: RRR.  No mrg.  S1/s2 present Abdomen:  Soft, NT, ND.  No mass or HSM.  Active BS.  No bruits or obvious hernias.   Rectal: deferred   Musc/Skeltl: no joint swelling or gross deformity Extremities:  No CCE  Neurologic:  Oriented x 3.  Excellent historian.  Moves all 4 limbs and strength full.  No gross deficits or tremors Skin:  No telangectasia, sores or rash Tattoos:  non   Psych:  Pleasant, slightly anxious.  Fully cooperative.  Affect normal, not depressed.   Intake/Output from previous day: 09/22 0701 - 09/23 0700 In: 413.3 [I.V.:363.3; IV Piggyback:50] Out: 375 [Urine:375] Intake/Output this shift: No intake/output data recorded.  LAB RESULTS:  Recent Labs  10/10/15 1838 10/11/15 0535  WBC 10.7* 6.3  HGB 15.5 12.9*  HCT 46.1 38.3*  PLT 179 143*   BMET Lab Results  Component Value Date   NA 140 10/11/2015   NA 139 10/10/2015   NA 142 03/12/2015   K 3.5 10/11/2015   K 4.1 10/10/2015   K 4.2 03/12/2015   CL 112 (H) 10/11/2015   CL 105 10/10/2015   CL 105 03/12/2015   CO2 21 (L) 10/11/2015   CO2 26 10/10/2015   CO2 29 03/12/2015   GLUCOSE 100 (H) 10/11/2015   GLUCOSE 172 (H) 10/10/2015   GLUCOSE 92 03/12/2015  BUN 6 10/11/2015   BUN 9 10/10/2015   BUN 10 03/12/2015   CREATININE 0.59 (L) 10/11/2015   CREATININE 0.77 10/10/2015   CREATININE 0.95 03/12/2015   CALCIUM 7.9 (L) 10/11/2015   CALCIUM 9.0 10/10/2015   CALCIUM 9.2 03/12/2015   LFT  Recent Labs  10/10/15 1838 10/11/15 0535  PROT 6.4* 4.8*  ALBUMIN 3.7 2.8*  AST 121* 103*  ALT 277* 208*  ALKPHOS 312* 246*  BILITOT  6.1* 6.2*  BILIDIR  --  4.1*   PT/INR Lab Results  Component Value Date   INR 1.18 10/11/2015   INR 1.08 11/18/2010   INR 1.0 08/17/2010   Hepatitis Panel No results for input(s): HEPBSAG, HCVAB, HEPAIGM, HEPBIGM in the last 72 hours. C-Diff No components found for: CDIFF Lipase     Component Value Date/Time   LIPASE 644 (H) 10/10/2015 1838    Drugs of Abuse  No results found for: LABOPIA, COCAINSCRNUR, LABBENZ, AMPHETMU, THCU, LABBARB   RADIOLOGY STUDIES: Dg Chest 2 View  Result Date: 10/10/2015 CLINICAL DATA:  Abdominal pain, vomiting and cough for 4 days, on antibiotics. Abdominal pain. History of hypertension, hyperlipidemia. EXAM: CHEST  2 VIEW COMPARISON:  Chest radiograph January 31, 2015 FINDINGS: The cardiac silhouette is mildly enlarged, mediastinal silhouette is unremarkable. Mildly calcified aortic knob. No pleural effusions or focal consolidations. Trachea projects midline and there is no pneumothorax. Soft tissue planes and included osseous structures are non-suspicious. Thoracolumbar levoscoliosis. Moderate degenerative change of thoracic spine. Severe LEFT glenohumeral osteoarthrosis and loose bodies. IMPRESSION: Mild cardiomegaly, no acute pulmonary process. Electronically Signed   By: Elon Alas M.D.   On: 10/10/2015 23:20   Ct Abdomen Pelvis W Contrast  Result Date: 10/10/2015 CLINICAL DATA:  Abdominal pain, vomiting, cough. EXAM: CT ABDOMEN AND PELVIS WITH CONTRAST TECHNIQUE: Multidetector CT imaging of the abdomen and pelvis was performed using the standard protocol following bolus administration of intravenous contrast. CONTRAST:  191m ISOVUE-300 IOPAMIDOL (ISOVUE-300) INJECTION 61% COMPARISON:  CT abdomen pelvis dated 06/14 715 FINDINGS: Lower chest: Lung bases are essentially clear. Hepatobiliary: Liver is within normal limits. Status post cholecystectomy. No intrahepatic or extrahepatic ductal dilatation. Pancreas: Within normal limits. Spleen: Within  normal limits. Adrenals/Urinary Tract: Adrenal glands are within normal limits. Kidneys are notable for bilateral renal sinus cysts. Mild urothelial thickening/enhancement involving the left proximal collecting system (series 2/ image 29). No frank hydronephrosis. Mild wall thickening involving the bladder (series 2/image 60). Stomach/Bowel: Stomach is notable for a tiny hiatal hernia. No evidence of bowel obstruction. Normal appendix (series 2/ image 43). Extensive sigmoid diverticulosis, without evidence of diverticulitis. Vascular/Lymphatic: Atherosclerotic calcifications of the abdominal aorta and branch vessels. No evidence of abdominal aortic aneurysm. No suspicious abdominopelvic lymphadenopathy. Reproductive: Prostatomegaly, with enlargement the central gland which indents the base of the bladder. Other: No abdominopelvic ascites. Small fat containing left inguinal hernia (series 2/image 67). Musculoskeletal: Right hip arthroplasty, without evidence of complication. Moderate degenerative changes of the visualized thoracolumbar spine, with associated lumbar dextroscoliosis. IMPRESSION: No evidence of bowel obstruction.  Normal appendix. Extensive sigmoid diverticulosis, without evidence of diverticulitis. Mildly thick-walled bladder, correlate for cystitis. Associated wall thickening/enhancement involving the left proximal collecting system, possibly reflecting ascending infection. Prostatomegaly, suggesting BPH. Additional ancillary findings as above. Electronically Signed   By: SJulian HyM.D.   On: 10/10/2015 22:33     IMPRESSION:   *  Acute abdominal pain with n/v, jaundice and cholestatic LFT pattern, elevated Lipase.  Hx recurrent choledocholithiasis and 2 ERCPs with sphinct and  stone removal in past.  GB is out.  ? Recurrent choledocholithiasis with mild associated pancreatitis, despite no revealing findings on CT  ? Could he have DILI from th z pack?   *  UTI.   Levaquin added to Z  pack.    *  URI.  Has one more day of Zpack today but with ? Of this causing DILI, will stop.  URI is likely allergies or viral anyway.    *  Chronic thrombocytopenia, historically worsens when ill.  Dr Beryle Beams has followed him in past.  *  Adenomatous colon polyps in 2008  *  Osa, intolerant of CPAP.  No acute resp distress at present.     PLAN:     *  See below   Azucena Freed  10/11/2015, 8:15 AM Pager: [phone]  ________________________________________________________________________  Velora Heckler GI MD note:  I personally examined the patient, reviewed the data and agree with the assessment and plan described above.  He's had CBD stones removed twice by ERCP and I think it is very likely he has symptomatic CBD stones +/- cholangitis now.  Liver tests are elevated and although z pack can cause cholestasis (and pancreatitis) he's never had trouble with it in the past, his postprandial epigastric pains started 2-3 weeks before the z pack and he has history of recurrent CBD stones.  Will plan on ERCP tomorrow morning... For now planning on start time 7:30 am.    Owens Loffler, MD [facility] Pager [phone]

## 2015-10-12 NOTE — Anesthesia Preprocedure Evaluation (Addendum)
Anesthesia Evaluation  Patient identified by MRN, date of birth, ID band Patient awake    Reviewed: Allergy & Precautions, NPO status , Patient's Chart, lab work & pertinent test results  Airway Mallampati: II  TM Distance: >3 FB Neck ROM: Full    Dental no notable dental hx. (+)    Pulmonary sleep apnea , former smoker,  C/o ecent head cold with productive cough. Told it was post nasal drip. Denies sob. Some rib pain with coughing   breath sounds clear to auscultation (-) wheezing      Cardiovascular hypertension, Pt. on medications and Pt. on home beta blockers + CAD and +CHF  Normal cardiovascular exam Rhythm:Regular Rate:Normal     Neuro/Psych  Headaches, PSYCHIATRIC DISORDERS Depression    GI/Hepatic negative GI ROS, Neg liver ROS,   Endo/Other  negative endocrine ROS  Renal/GU Renal disease  negative genitourinary   Musculoskeletal  (+) Arthritis ,   Abdominal   Peds negative pediatric ROS (+)  Hematology  (+) anemia ,   Anesthesia Other Findings   Reproductive/Obstetrics negative OB ROS                           Anesthesia Physical Anesthesia Plan  ASA: III  Anesthesia Plan: General   Post-op Pain Management:    Induction: Intravenous  Airway Management Planned: Oral ETT  Additional Equipment:   Intra-op Plan:   Post-operative Plan: Extubation in OR  Informed Consent: I have reviewed the patients History and Physical, chart, labs and discussed the procedure including the risks, benefits and alternatives for the proposed anesthesia with the patient or authorized representative who has indicated his/her understanding and acceptance.   Dental advisory given  Plan Discussed with: CRNA  Anesthesia Plan Comments:        Anesthesia Quick Evaluation

## 2015-10-12 NOTE — Interval H&P Note (Signed)
History and Physical Interval Note:  10/12/2015 7:02 AM  [name]  has presented today for surgery, with the diagnosis of choledocholithiasis, jaundice, abdominal pain, nausea vomiting  The various methods of treatment have been discussed with the patient and family. After consideration of risks, benefits and other options for treatment, the patient has consented to  Procedure(s): ENDOSCOPIC RETROGRADE CHOLANGIOPANCREATOGRAPHY (ERCP) (N/A) as a surgical intervention .  The patient's history has been reviewed, patient examined, no change in status, stable for surgery.  I have reviewed the patient's chart and labs.  Questions were answered to the patient's satisfaction.     [name]

## 2015-10-12 NOTE — Anesthesia Postprocedure Evaluation (Signed)
Anesthesia Post Note  Patient: [name]  Procedure(s) Performed: Procedure(s) (LRB): ENDOSCOPIC RETROGRADE CHOLANGIOPANCREATOGRAPHY (ERCP) (N/A)  Patient location during evaluation: PACU Anesthesia Type: General Level of consciousness: awake and alert Pain management: pain level controlled Vital Signs Assessment: post-procedure vital signs reviewed and stable Respiratory status: spontaneous breathing, nonlabored ventilation, respiratory function stable and patient connected to nasal cannula oxygen Cardiovascular status: blood pressure returned to baseline and stable Postop Assessment: no signs of nausea or vomiting Anesthetic complications: no    Last Vitals:  Vitals:   10/12/15 0830 10/12/15 0853  BP: 138/81 (!) 145/73  Pulse: 86 84  Resp: 20 18  Temp: 36.8 C 36.7 C    Last Pain:  Vitals:   10/12/15 0644  TempSrc: Oral  PainSc:                  Amere Iott J

## 2015-10-12 NOTE — Anesthesia Procedure Notes (Signed)
Procedure Name: Intubation Date/Time: 10/12/2015 7:25 AM Performed by: Lind Covert Pre-anesthesia Checklist: Patient identified, Emergency Drugs available, Suction available, Patient being monitored and Timeout performed Patient Re-evaluated:Patient Re-evaluated prior to inductionOxygen Delivery Method: Circle system utilized Preoxygenation: Pre-oxygenation with 100% oxygen Intubation Type: IV induction Laryngoscope Size: Mac and 4 Grade View: Grade I Tube type: Oral Tube size: 7.5 mm Number of attempts: 1 Airway Equipment and Method: Stylet Placement Confirmation: ETT inserted through vocal cords under direct vision,  breath sounds checked- equal and bilateral and positive ETCO2 Secured at: 22 cm Tube secured with: Tape Dental Injury: Teeth and Oropharynx as per pre-operative assessment

## 2015-10-12 NOTE — Op Note (Addendum)
[facility] Patient Name: [name] Procedure Date: 10/12/2015 MRN: [id number] Attending MD: [name] , MD Date of Birth: 31-Jul-1933 CSN: [id number] Age: 82 Admit Type: Outpatient Procedure:                ERCP Indications:              Epigastric abdominal pain, Jaundice, fever; remote                            cholecystectomy; ERCPs twice for recurrent CBD                            stones most recently 2015 Dr. Carlean Purl Providers:                [name], MD, Elna Breslow, RN, Cherylynn Ridges, Technician Referring MD:              Medicines:                General Anesthesia, Indomethacin 123XX123 mg PR Complications:            No immediate complications. Estimated blood loss:                            None Estimated Blood Loss:     Estimated blood loss: none. Procedure:                Pre-Anesthesia Assessment:                           - Prior to the procedure, a History and Physical                            was performed, and patient medications and                            allergies were reviewed. The patient's tolerance of                            previous anesthesia was also reviewed. The risks                            and benefits of the procedure and the sedation                            options and risks were discussed with the patient.                            All questions were answered, and informed consent                            was obtained. Prior Anticoagulants: The patient has  taken no previous anticoagulant or antiplatelet                            agents. ASA Grade Assessment: II - A patient with                            mild systemic disease. After reviewing the risks                            and benefits, the patient was deemed in                            satisfactory condition to undergo the procedure.                           After obtaining informed  consent, the scope was                            passed under direct vision. Throughout the                            procedure, the patient's blood pressure, pulse, and                            oxygen saturations were monitored continuously. The                            [phone]) scope was introduced through                            the mouth, and used to inject contrast into and                            used to inject contrast into the bile duct. The                            ERCP was accomplished without difficulty. The                            patient tolerated the procedure well. Scope In: Scope Out: Findings:      A scout film of the abdomen was obtained. Surgical clips, consistent       with a previous cholecystectomy, were seen in the area of the right       upper quadrant of the abdomen. The esophagus was successfully intubated       under direct vision. The scope was advanced to a normal major papilla in       the descending duodenum without detailed examination of the pharynx,       larynx and associated structures, and upper GI tract. There was evidence       of previous biliary sphinctertomy. A 44 [employer]ot[employer] over a .035 hydrawire       was used to cannulated the bile duct and contrast was injected.       Cholangiogram revealed non-dilated extrahepatic biliary tree, cyst  duct       stump without leak, limited intrahepatic visualization was normal. There       were no clear filling defects and so strictures. I used a 9-63mm       retrieval balloon two sweep the bile duct several times without deliver       of stones, sludge, stone debris into the bile duct. Completion,       occlusion cholangiogram showed no filling defects and the scope was       removed. The pancreatic duct was never cannulated with wire or injected       with dye. Impression:               - Non-dilated extrahepatic biliary tree without                            stones, debris.                            - Possible causes of his elevated liver tests: a                            stone was present and it passed before this                            procedure; drug reaction (z-pack recently has known                            potential cholestatic reaction-this med has been                            stopped); systemic sign of his other known                            infection (UTI); other (acute viral hepatitis panel                            was negative however). Moderate Sedation:      N/A- Per Anesthesia Care Recommendation:           - Return patient to hospital ward for ongoing care.                           - Follow liver tests for now, allow him to eat. Procedure Code(s):        --- Professional ---                           [phone], Endoscopic retrograde                            cholangiopancreatography (ERCP); diagnostic,                            including collection of specimen(s) by brushing or                            washing, when performed (separate procedure) Diagnosis Code(s):        ---  Professional ---                           R10.13, Epigastric pain                           R17, Unspecified jaundice CPT copyright 2016 American [employer] Association. All rights reserved. The codes documented in this report are preliminary and upon coder review may  be revised to meet current compliance requirements. [name], MD 10/12/2015 8:20:33 AM This report has been signed electronically. Number of Addenda: 0

## 2015-10-12 NOTE — Transfer of Care (Signed)
Immediate Anesthesia Transfer of Care Note  Patient: [name]  Procedure(s) Performed: Procedure(s): ENDOSCOPIC RETROGRADE CHOLANGIOPANCREATOGRAPHY (ERCP) (N/A)  Patient Location: PACU  Anesthesia Type:General  Level of Consciousness: sedated  Airway & Oxygen Therapy: Patient Spontanous Breathing and Patient connected to face mask oxygen  Post-op Assessment: Report given to RN and Post -op Vital signs reviewed and stable  Post vital signs: Reviewed and stable  Last Vitals:  Vitals:   10/11/15 2148 10/12/15 0644  BP: 120/64 (!) 122/58  Pulse: 89 83  Resp: 18 18  Temp: 37.7 C 37.1 C    Last Pain:  Vitals:   10/12/15 0644  TempSrc: Oral  PainSc:          Complications: No apparent anesthesia complications

## 2015-10-13 ENCOUNTER — Ambulatory Visit: Payer: Self-pay | Admitting: Family Medicine

## 2015-10-13 ENCOUNTER — Telehealth: Payer: Self-pay | Admitting: Family Medicine

## 2015-10-13 DIAGNOSIS — R05 Cough: Secondary | ICD-10-CM

## 2015-10-13 DIAGNOSIS — Z66 Do not resuscitate: Secondary | ICD-10-CM

## 2015-10-13 LAB — COMPREHENSIVE METABOLIC PANEL
ALK PHOS: 332 U/L — AB (ref 38–126)
ALT: 158 U/L — AB (ref 17–63)
ANION GAP: 5 (ref 5–15)
AST: 76 U/L — ABNORMAL HIGH (ref 15–41)
Albumin: 2.5 g/dL — ABNORMAL LOW (ref 3.5–5.0)
BILIRUBIN TOTAL: 4.5 mg/dL — AB (ref 0.3–1.2)
BUN: 7 mg/dL (ref 6–20)
CALCIUM: 7.8 mg/dL — AB (ref 8.9–10.3)
CO2: 23 mmol/L (ref 22–32)
CREATININE: 0.75 mg/dL (ref 0.61–1.24)
Chloride: 113 mmol/L — ABNORMAL HIGH (ref 101–111)
Glucose, Bld: 133 mg/dL — ABNORMAL HIGH (ref 65–99)
Potassium: 3.5 mmol/L (ref 3.5–5.1)
Sodium: 141 mmol/L (ref 135–145)
TOTAL PROTEIN: 4.6 g/dL — AB (ref 6.5–8.1)

## 2015-10-13 LAB — CBC
HCT: 38.1 % — ABNORMAL LOW (ref 39.0–52.0)
HEMOGLOBIN: 12.8 g/dL — AB (ref 13.0–17.0)
MCH: 28.1 pg (ref 26.0–34.0)
MCHC: 33.6 g/dL (ref 30.0–36.0)
MCV: 83.6 fL (ref 78.0–100.0)
PLATELETS: 128 10*3/uL — AB (ref 150–400)
RBC: 4.56 MIL/uL (ref 4.22–5.81)
RDW: 15.3 % (ref 11.5–15.5)
WBC: 7.1 10*3/uL (ref 4.0–10.5)

## 2015-10-13 LAB — URINE CULTURE: Culture: >=100000 — AB

## 2015-10-13 LAB — GLUCOSE, CAPILLARY: GLUCOSE-CAPILLARY: 125 mg/dL — AB (ref 65–99)

## 2015-10-13 MED ORDER — CEPHALEXIN 500 MG PO CAPS
500.0000 mg | ORAL_CAPSULE | Freq: Two times a day (BID) | ORAL | Status: DC
  Administered 2015-10-13 – 2015-10-14 (×2): 500 mg via ORAL
  Filled 2015-10-13 (×3): qty 1

## 2015-10-13 MED ORDER — GUAIFENESIN ER 600 MG PO TB12
600.0000 mg | ORAL_TABLET | Freq: Two times a day (BID) | ORAL | Status: DC
  Administered 2015-10-13 – 2015-10-14 (×3): 600 mg via ORAL
  Filled 2015-10-13 (×3): qty 1

## 2015-10-13 MED ORDER — BENZONATATE 100 MG PO CAPS
100.0000 mg | ORAL_CAPSULE | Freq: Three times a day (TID) | ORAL | Status: DC | PRN
  Administered 2015-10-13: 100 mg via ORAL
  Filled 2015-10-13: qty 1

## 2015-10-13 NOTE — Telephone Encounter (Signed)
[name] called to cancel appointment today.  She wanted to let you know [name] is @ [facility] with spesis

## 2015-10-13 NOTE — Telephone Encounter (Signed)
Thanks for letting me know and I am so sorry to hear that.  I will follow along with how he is doing

## 2015-10-13 NOTE — Progress Notes (Signed)
PROGRESS NOTE    [name]  [id number] DOB: 1933-08-09 DOA: 10/10/2015 PCP: Arnette Norris, MD   Outpatient Specialists:    Brief Narrative:  [name] is a 82 y.o. male with medical history significant of hypertension, hyperlipidemia, pernicious anemia, BPH, CAD, s/p of cholecystectomy, choledocholithiasis, who presents with abdominal pain, jaundice, dysuria, cough.  Patient states that he has abdominal pain the past 3 days. It is associated with nausea, vomiting and abdominal distention. No diarrhea. He was noted to have yellow eyes by her daughter. He vomited twice today. His abdominal pain is located in the umbilical area, constant, sharp, 8 out of 10 in severity, nonradiating. It is aggravated by eating food.  Patient also has dysuria, and burning on urination for 2 days. He states that he has been coughing with yellow colored sputum production for 7 days. No fever, chills, shortness of breath. He has mild chest pain which is induced by coughing only. He has been taking Z-Pak for 4 days, has 1 more pill left.     Assessment & Plan:   Principal Problem:   Abdominal pain Active Problems:   Coronary atherosclerosis   DNR (do not resuscitate)   Choledocholithiasis with obstruction   Abnormal LFTs   Cough   UTI (lower urinary tract infection)   Nausea with vomiting   Sepsis ([place])   Pancreatitis   Jaundice   Abdominal pain and sepsis:  -abnormal liver function with total bilirubin 6.4, lipase 644, indicating possible choledocholithiasis with obstruction.  -s/p ERCP--- LFTs trending down  Coronary atherosclerosis: No CP. -Continue metoprolol, when necessary nitroglycerin  Cough: Chest x-ray negative. Possible bronchitis. -Left patient to complete Z-Pak, one more dose today -When necessary Mucinex for cough  UTI: -ecoli -change to keflex  Pancreatitis: Secondary to possible choledocholithiasis with obstruction. -IV fluid as above -Symptomatic  treatment as above Eating well  HTN: -Continue metoprolol  BPH: stable - Continue Flomax  Pernicious anemia: Hemoglobin 15.5. Patient is due for B12 injection. -Give one dose of B12 injection, 1000 g 1   DVT prophylaxis:  SCD's  Code Status: DNR   Family Communication: No family at bedside  Disposition Plan:  Home in AM   Consultants:  GI    Subjective: C/o cough  Objective: Vitals:   10/12/15 1440 10/12/15 2052 10/13/15 0523 10/13/15 0932  BP: 110/63 (!) 111/55 123/60 (!) 125/56  Pulse: 66 93 85 80  Resp: 18 16 18    Temp: 98 F (36.7 C) 98.3 F (36.8 C) 98.9 F (37.2 C)   TempSrc: Oral Oral Oral   SpO2: 98% 95% 95%   Weight:      Height:        Intake/Output Summary (Last 24 hours) at 10/13/15 1229 Last data filed at 10/13/15 0523  Gross per 24 hour  Intake             2520 ml  Output             1250 ml  Net             1270 ml   Filed Weights   10/10/15 2013 10/11/15 0142  Weight: 63 kg (139 lb) 62.8 kg (138 lb 8 oz)    Examination:  General exam: Appears calm and comfortable  Respiratory system: Clear to auscultation. Respiratory effort normal. Cardiovascular system: S1 & S2 heard, RRR. No JVD, murmurs, rubs, gallops or clicks. No pedal edema. Gastrointestinal system: Abdomen is nondistended, soft and nontender. No organomegaly or masses  felt. Normal bowel sounds heard.   Data Reviewed: I have personally reviewed following labs and imaging studies  CBC:  Recent Labs Lab 10/10/15 1838 10/11/15 0535 10/12/15 0525 10/13/15 0450  WBC 10.7* 6.3 5.7 7.1  HGB 15.5 12.9* 12.4* 12.8*  HCT 46.1 38.3* 36.7* 38.1*  MCV 83.8 81.8 81.9 83.6  PLT 179 143* 131* 0000000*   Basic Metabolic Panel:  Recent Labs Lab 10/10/15 1838 10/11/15 0535 10/12/15 0525 10/13/15 0450  NA 139 140 138 141  K 4.1 3.5 3.3* 3.5  CL 105 112* 110 113*  CO2 26 21* 22 23  GLUCOSE 172* 100* 115* 133*  BUN 9 6 5* 7  CREATININE 0.77 0.59* 0.62 0.75  CALCIUM  9.0 7.9* 7.8* 7.8*   GFR: Estimated Creatinine Clearance: 57.4 mL/min (by C-G formula based on SCr of 0.75 mg/dL). Liver Function Tests:  Recent Labs Lab 10/10/15 1838 10/11/15 0535 10/12/15 0525 10/13/15 0450  AST 121* 103* 123* 76*  ALT 277* 208* 183* 158*  ALKPHOS 312* 246* 300* 332*  BILITOT 6.1* 6.2* 6.0* 4.5*  PROT 6.4* 4.8* 4.6* 4.6*  ALBUMIN 3.7 2.8* 2.6* 2.5*    Recent Labs Lab 10/10/15 1838 10/11/15 0836 10/12/15 0525  LIPASE 644* 36 51   No results for input(s): AMMONIA in the last 168 hours. Coagulation Profile:  Recent Labs Lab 10/11/15 0249  INR 1.18   Cardiac Enzymes: No results for input(s): CKTOTAL, CKMB, CKMBINDEX, TROPONINI in the last 168 hours. BNP (last 3 results) No results for input(s): PROBNP in the last 8760 hours. HbA1C: No results for input(s): HGBA1C in the last 72 hours. CBG:  Recent Labs Lab 10/11/15 0758 10/12/15 0904 10/13/15 0734  GLUCAP 97 136* 125*   Lipid Profile: No results for input(s): CHOL, HDL, LDLCALC, TRIG, CHOLHDL, LDLDIRECT in the last 72 hours. Thyroid Function Tests: No results for input(s): TSH, T4TOTAL, FREET4, T3FREE, THYROIDAB in the last 72 hours. Anemia Panel: No results for input(s): VITAMINB12, FOLATE, FERRITIN, TIBC, IRON, RETICCTPCT in the last 72 hours. Urine analysis:    Component Value Date/Time   COLORURINE AMBER (A) 10/10/2015 1800   APPEARANCEUR CLOUDY (A) 10/10/2015 1800   LABSPEC 1.020 10/10/2015 1800   PHURINE 5.5 10/10/2015 1800   GLUCOSEU NEGATIVE 10/10/2015 1800   HGBUR SMALL (A) 10/10/2015 1800   HGBUR negative 06/02/2009 1059   BILIRUBINUR LARGE (A) 10/10/2015 1800   BILIRUBINUR 1+ 11/01/2014 1512   KETONESUR 15 (A) 10/10/2015 1800   PROTEINUR NEGATIVE 10/10/2015 1800   UROBILINOGEN negative 11/01/2014 1512   UROBILINOGEN 1.0 07/01/2013 0450   NITRITE POSITIVE (A) 10/10/2015 1800   LEUKOCYTESUR MODERATE (A) 10/10/2015 1800      Recent Results (from the past 240  hour(s))  Urine culture     Status: Abnormal   Collection Time: 10/10/15  6:00 PM  Result Value Ref Range Status   Specimen Description URINE, CLEAN CATCH  Final   Special Requests NONE  Final   Culture >=100,000 COLONIES/mL ESCHERICHIA COLI (A)  Final   Report Status 10/13/2015 FINAL  Final   Organism ID, Bacteria ESCHERICHIA COLI (A)  Final      Susceptibility   Escherichia coli - MIC*    AMPICILLIN >=32 RESISTANT Resistant     CEFAZOLIN <=4 SENSITIVE Sensitive     CEFTRIAXONE <=1 SENSITIVE Sensitive     CIPROFLOXACIN >=4 RESISTANT Resistant     GENTAMICIN 2 SENSITIVE Sensitive     IMIPENEM <=0.25 SENSITIVE Sensitive     NITROFURANTOIN <=16 SENSITIVE Sensitive  TRIMETH/SULFA <=20 SENSITIVE Sensitive     AMPICILLIN/SULBACTAM 4 SENSITIVE Sensitive     Extended ESBL NEGATIVE Sensitive     * >=100,000 COLONIES/mL ESCHERICHIA COLI  Blood Culture (routine x 2)     Status: None (Preliminary result)   Collection Time: 10/10/15  8:13 PM  Result Value Ref Range Status   Specimen Description BLOOD LEFT FOREARM  Final   Special Requests BOTTLES DRAWN AEROBIC AND ANAEROBIC 5CC  Final   Culture   Final    NO GROWTH 2 DAYS Performed at [facility]    Report Status PENDING  Incomplete  Blood Culture (routine x 2)     Status: None (Preliminary result)   Collection Time: 10/10/15  8:36 PM  Result Value Ref Range Status   Specimen Description BLOOD BLOOD RIGHT WRIST  Final   Special Requests BOTTLES DRAWN AEROBIC AND ANAEROBIC 5CC  Final   Culture   Final    NO GROWTH 2 DAYS Performed at [facility]    Report Status PENDING  Incomplete  Urine culture     Status: None   Collection Time: 10/11/15  3:30 AM  Result Value Ref Range Status   Specimen Description URINE, RANDOM  Final   Special Requests NONE  Final   Culture NO GROWTH Performed at [facility]   Final   Report Status 10/12/2015 FINAL  Final      Anti-infectives    Start     Dose/Rate Route  Frequency Ordered Stop   10/13/15 1400  cephALEXin (KEFLEX) capsule 500 mg     500 mg Oral Every 12 hours 10/13/15 1138     10/11/15 2100  azithromycin (ZITHROMAX) tablet 250 mg  Status:  Discontinued    Comments:  ABT Start Date 10/07/15 & End Date 10/11/15.     250 mg Oral Daily 10/11/15 0236 10/11/15 0906   10/11/15 2000  levofloxacin (LEVAQUIN) IVPB 750 mg  Status:  Discontinued     750 mg 100 mL/hr over 90 Minutes Intravenous Every 24 hours 10/10/15 2039 10/11/15 0236   10/11/15 0800  vancomycin (VANCOCIN) 500 mg in sodium chloride 0.9 % 100 mL IVPB  Status:  Discontinued     500 mg 100 mL/hr over 60 Minutes Intravenous Every 12 hours 10/10/15 2039 10/11/15 0236   10/10/15 2045  ceFEPIme (MAXIPIME) 2 g in dextrose 5 % 50 mL IVPB  Status:  Discontinued     2 g 100 mL/hr over 30 Minutes Intravenous 2 times daily 10/10/15 2039 10/13/15 1138   10/10/15 2015  levofloxacin (LEVAQUIN) IVPB 750 mg     750 mg 100 mL/hr over 90 Minutes Intravenous  Once 10/10/15 2004 10/10/15 2216   10/10/15 2015  aztreonam (AZACTAM) 2 g in dextrose 5 % 50 mL IVPB  Status:  Discontinued     2 g 100 mL/hr over 30 Minutes Intravenous  Once 10/10/15 2004 10/10/15 2039   10/10/15 2015  vancomycin (VANCOCIN) IVPB 1000 mg/200 mL premix     1,000 mg 200 mL/hr over 60 Minutes Intravenous  Once 10/10/15 2004 10/10/15 2216       Radiology Studies: Dg Ercp  Result Date: 10/12/2015 CLINICAL DATA:  ERCP for common bile stone; EXAM: ERCP TECHNIQUE: Multiple spot images obtained with the fluoroscopic device and submitted for interpretation post-procedure. FLUOROSCOPY TIME:  Fluoroscopy Time: 1 min 40 sec fluoro time, 9.27 mGy COMPARISON:  Ultrasound 10/11/2015 and previous FINDINGS: Single fluoroscopic spot image documents endoscopic catheterization in partial opacification of the  biliary tree. The CBD is nondilated. Distal CBD is obstructed by the overlying scope. Intrahepatic ducts incompletely opacified. IMPRESSION:  Endoscopic CBD intervention. These images were submitted for radiologic interpretation only. Please see the procedural report for the amount of contrast and the fluoroscopy time utilized. Electronically Signed   By: Lucrezia Europe M.D.   On: 10/12/2015 10:10        Scheduled Meds: . cephALEXin  500 mg Oral Q12H  . clobetasol cream  1 application Topical BID  . fluticasone  1 spray Each Nare Daily  . guaiFENesin  600 mg Oral BID  . metoprolol succinate  25 mg Oral Daily  . omega-3 acid ethyl esters  1 g Oral Daily  . sodium chloride flush  3 mL Intravenous Q12H  . tamsulosin  0.4 mg Oral QPC supper   Continuous Infusions:     LOS: 2 days    Time spent: 25 min    Lutz, DO [facility] Pager [phone]  If 7PM-7AM, please contact night-coverage [email or web address] Password [facility] 10/13/2015, 12:29 PM

## 2015-10-13 NOTE — Evaluation (Signed)
Physical Therapy Evaluation Patient Details Name: [name] MRN: [id number] DOB: November 19, 1933 Today's Date: 10/13/2015   History of Present Illness  82 yo male admitted with abd pain, jaundice. Hx of HTN, CHF, OA, CAD, anemia.   Clinical Impression  On eval, pt was Min guard assist for mobility. He walked ~500 feet. Pt tolerated distance well. No LOB during session. Recommend daily ambulation with family or nursing as able. Do not anticipate any follow up PT needs. Daughter was present during session. She stated she lives down the street from pt and that she can help if needed.     Follow Up Recommendations No PT follow up;Supervision - Intermittent    Equipment Recommendations  None recommended by PT    Recommendations for Other Services       Precautions / Restrictions Precautions Precautions: Fall Restrictions Weight Bearing Restrictions: No      Mobility  Bed Mobility Overal bed mobility: Needs Assistance Bed Mobility: Supine to Sit     Supine to sit: HOB elevated;Min guard     General bed mobility comments: close guard for safety. Increased time  Transfers Overall transfer level: Needs assistance   Transfers: Sit to/from Stand Sit to Stand: Min guard         General transfer comment: close guard for safety  Ambulation/Gait Ambulation/Gait assistance: Min guard Ambulation Distance (Feet): 500 Feet Assistive device: None Gait Pattern/deviations: Step-through pattern     General Gait Details: No LOB. Pt tolerated distance well.  Stairs            Wheelchair Mobility    Modified Rankin (Stroke Patients Only)       Balance                                             Pertinent Vitals/Pain Pain Assessment: No/denies pain    Home Living Family/patient expects to be discharged to:: Private residence Living Arrangements: Alone Available Help at Discharge: Family;Available PRN/intermittently Type of Home: House Home  Access: Stairs to enter   [employer] of Steps: 2 Home Layout: One level Home Equipment: None      Prior Function Level of Independence: Independent               Hand Dominance        Extremity/Trunk Assessment   Upper Extremity Assessment: Overall WFL for tasks assessed           Lower Extremity Assessment: Overall WFL for tasks assessed      Cervical / Trunk Assessment: Normal  Communication   Communication: No difficulties  Cognition Arousal/Alertness: Awake/alert Behavior During Therapy: WFL for tasks assessed/performed Overall Cognitive Status: Within Functional Limits for tasks assessed                      General Comments      Exercises     Assessment/Plan    PT Assessment Patient needs continued PT services  PT Problem List Decreased mobility          PT Treatment Interventions Gait training;Functional mobility training;Therapeutic activities;Therapeutic exercise;Patient/family education    PT Goals (Current goals can be found in the Care Plan section)  Acute Rehab PT Goals Patient Stated Goal: home soon. return to PLOF PT Goal Formulation: With patient/family Time For Goal Achievement: 10/27/15 Potential to Achieve Goals: Good    Frequency Min  3X/week   Barriers to discharge        Co-evaluation               End of Session Equipment Utilized During Treatment: Gait belt Activity Tolerance: Patient tolerated treatment well Patient left: in chair;with call bell/phone within reach;with family/visitor present           Time: [id number] PT Time Calculation (min) (ACUTE ONLY): 13 min   Charges:   PT Evaluation $PT Eval Low Complexity: 1 Procedure     PT G Codes:        Weston Anna, MPT Pager: [phone]

## 2015-10-13 NOTE — Progress Notes (Signed)
Progress Note   Subjective  Chief Complaint:Abdominal Pain, Jaundice  Pt s/p ERCP 10/12/15- results below  This morning patient has improved, he ate a regular breakfast and denies further n/v. He does feel "full".  Denies abdominal pain "other than when I cough".    Objective   Vital signs in last 24 hours: Temp:  [98 F (36.7 C)-98.9 F (37.2 C)] 98.9 F (37.2 C) (09/25 0523) Pulse Rate:  [66-93] 80 (09/25 0932) Resp:  [16-18] 18 (09/25 0523) BP: (110-125)/(55-63) 125/56 (09/25 0932) SpO2:  [95 %-98 %] 95 % (09/25 0523) Last BM Date: 10/10/15 General: Caucasian male in NAD Heart:  Regular rate and rhythm; no murmurs Lungs: Respirations even and unlabored, lungs CTA bilaterally Abdomen:  Soft, nontender and nondistended. Normal bowel sounds. Extremities:  Without edema. Neurologic:  Alert and oriented,  grossly normal neurologically. Psych:  Cooperative. Normal mood and affect.  Intake/Output from previous day: 09/24 0701 - 09/25 0700 In: 3095 [P.O.:720; I.V.:2275; IV Piggyback:100] Out: 1300 [Urine:1300]  Lab Results:  Recent Labs  10/11/15 0535 10/12/15 0525 10/13/15 0450  WBC 6.3 5.7 7.1  HGB 12.9* 12.4* 12.8*  HCT 38.3* 36.7* 38.1*  PLT 143* 131* 128*   BMET  Recent Labs  10/11/15 0535 10/12/15 0525 10/13/15 0450  NA 140 138 141  K 3.5 3.3* 3.5  CL 112* 110 113*  CO2 21* 22 23  GLUCOSE 100* 115* 133*  BUN 6 5* 7  CREATININE 0.59* 0.62 0.75  CALCIUM 7.9* 7.8* 7.8*   LFT  Recent Labs  10/11/15 0535  10/13/15 0450  PROT 4.8*  < > 4.6*  ALBUMIN 2.8*  < > 2.5*  AST 103*  < > 76*  ALT 208*  < > 158*  ALKPHOS 246*  < > 332*  BILITOT 6.2*  < > 4.5*  BILIDIR 4.1*  --   --   < > = values in this interval not displayed. PT/INR  Recent Labs  10/11/15 0249  LABPROT 15.1  INR 1.18    Studies/Results: Dg Ercp  Result Date: 10/12/2015 CLINICAL DATA:  ERCP for common bile stone; EXAM: ERCP TECHNIQUE: Multiple spot images obtained with  the fluoroscopic device and submitted for interpretation post-procedure. FLUOROSCOPY TIME:  Fluoroscopy Time: 1 min 40 sec fluoro time, 9.27 mGy COMPARISON:  Ultrasound 10/11/2015 and previous FINDINGS: Single fluoroscopic spot image documents endoscopic catheterization in partial opacification of the biliary tree. The CBD is nondilated. Distal CBD is obstructed by the overlying scope. Intrahepatic ducts incompletely opacified. IMPRESSION: Endoscopic CBD intervention. These images were submitted for radiologic interpretation only. Please see the procedural report for the amount of contrast and the fluoroscopy time utilized. Electronically Signed   By: Lucrezia Europe M.D.   On: 10/12/2015 10:10       Assessment / Plan:   Assessment: 1. Acute abdominal pain: with n/v, jaundice and cholestatic LFT pattern, elevated Lipase-ERCP yesterday neg/nl as above-at this time believe this was a reaction to recent Azithromycin, LFT's trending downward today, no further n/v or abdominal pain 2. UTI: Levaquin currently 3. URI: was on Zpack-thought caused DILI-stopped 4. Chronic thrombocytopenia 5. Adenomatous polyp 2008 6. OSA-intolerant of CPAP  Plan: 1. Pt tolerating regular diet today, no further n/v or abdominal pain-continue regular diet 2. Continue supportive measures 3. Will discuss above with Dr. Hilarie Fredrickson, please await any further recs-we will likley sign off  Thank you for your kind consultation.   LOS: 2 days   Levin Erp  10/13/2015, 10:35 AM  Pager # [phone]

## 2015-10-13 NOTE — Telephone Encounter (Signed)
Spoke with daughter, she was concerned that pt was not seen on Friday afternoon when he felt poorly.   She will bring in POA paperwork and complete updated DPR to be scanned into pt chart.  Updated pt's marital status.

## 2015-10-14 MED ORDER — BENZONATATE 100 MG PO CAPS: 100.0000 mg | ORAL_CAPSULE | Freq: Three times a day (TID) | ORAL | 0 refills | Status: DC | PRN

## 2015-10-14 MED ORDER — CEPHALEXIN 500 MG PO CAPS: 500.0000 mg | ORAL_CAPSULE | Freq: Two times a day (BID) | ORAL | 0 refills | Status: DC

## 2015-10-14 MED ORDER — OXYCODONE HCL 5 MG PO TABS: 5.0000 mg | ORAL_TABLET | ORAL | 0 refills | Status: DC | PRN

## 2015-10-14 MED ORDER — MAGIC MOUTHWASH
5.0000 mL | Freq: Three times a day (TID) | ORAL | Status: DC | PRN
  Filled 2015-10-14: qty 5

## 2015-10-14 MED ORDER — MAGIC MOUTHWASH: 5.0000 mL | Freq: Three times a day (TID) | ORAL | 0 refills | Status: DC | PRN

## 2015-10-14 NOTE — Progress Notes (Signed)
Pt DC instructions and medications were reviewed with patient and daughter. Questions were answered and patient was taken to car via wheelchair with NT. [place]

## 2015-10-14 NOTE — Discharge Summary (Signed)
Physician Discharge Summary  [name] WUJ:[id number] DOB: 10-09-1933 DOA: 10/10/2015  PCP: Brandon Mannan, MD  Admit date: 10/10/2015 Discharge date: 10/14/2015   Recommendations for Outpatient Follow-Up:   1. Finish abx course for UTI 2. 6 small meals through out the day 3. CMP 2 weeks for resolution of LFTs   Discharge Diagnosis:   Principal Problem:   Abdominal pain Active Problems:   Coronary atherosclerosis   DNR (do not resuscitate)   Choledocholithiasis with obstruction   Abnormal LFTs   Cough   UTI (lower urinary tract infection)   Nausea with vomiting   Sepsis (HCC)   Pancreatitis   Jaundice   Discharge disposition:  Home.   Discharge Condition: Improved.  Diet recommendation: ergular  Wound care: None.   History of Present Illness:    [name] is a 82 y.o. male with medical history significant of hypertension, hyperlipidemia, pernicious anemia, BPH, CAD, s/p of cholecystectomy, choledocholithiasis, who presents with abdominal pain, jaundice, dysuria, cough.  Patient states that he has abdominal pain the past 3 days. It is associated with nausea, vomiting and abdominal distention. No diarrhea. He was noted to have yellow eyes by her daughter. He vomited twice today. His abdominal pain is located in the umbilical area, constant, sharp, 8 out of 10 in severity, nonradiating. It is aggravated by eating food.  Patient also has dysuria, and burning on urination for 2 days. He states that he has been coughing with yellow colored sputum production for 7 days. No fever, chills, shortness of breath. He has mild chest pain which is induced by coughing only. He has been taking Z-Pak for 4 days, has 1 more pill left.     Hospital Course by Problem:   Abdominal pain and sepsis: -abnormal liver function with total bilirubin 6.4, lipase 644, indicating possible choledocholithiasis with obstruction.  -s/p ERCP--- no stone seen-- ? If passed, ?  Medication related  Coronary atherosclerosis: No CP. -Continue metoprolol, when necessary nitroglycerin  Cough: Chest x-ray negative. Possible bronchitis. -When necessary Mucinex for cough  UTI: -cefepime to kelfex -Follow-up urine culture- e coli  Pancreatitis: Secondary to possible choledocholithiasis with obstruction. -IV fluid as above -Symptomatic treatment as above Eating well  HTN: -Continue metoprolol  BPH: stable - Continue Flomax  Pernicious anemia: Hemoglobin 15.5. Patient is due for B12 injection. -Giveone dose of B12 injection, 1000 g 1    Medical Consultants:    GI   Discharge Exam:   Vitals:   10/13/15 2034 10/14/15 0528  BP: (!) 126/48 126/70  Pulse: 93 84  Resp: 20 20  Temp: 99.1 F (37.3 C) 98.7 F (37.1 C)   Vitals:   10/13/15 0932 10/13/15 1447 10/13/15 2034 10/14/15 0528  BP: (!) 125/56 125/66 (!) 126/48 126/70  Pulse: 80 76 93 84  Resp:  18 20 20   Temp:  98.9 F (37.2 C) 99.1 F (37.3 C) 98.7 F (37.1 C)  TempSrc:  Oral Oral Oral  SpO2:  98% 94% 94%  Weight:      Height:        Gen:  NAD Cardiovascular:  RRR, No M/R/G Respiratory: Lungs CTAB Gastrointestinal: Abdomen soft, NT/ND with normal active bowel sounds. Extremities: No C/E/C   The results of significant diagnostics from this hospitalization (including imaging, microbiology, ancillary and laboratory) are listed below for reference.     Procedures and Diagnostic Studies:   Dg Chest 2 View  Result Date: 10/10/2015 CLINICAL DATA:  Abdominal pain, vomiting and cough  for 4 days, on antibiotics. Abdominal pain. History of hypertension, hyperlipidemia. EXAM: CHEST  2 VIEW COMPARISON:  Chest radiograph January 31, 2015 FINDINGS: The cardiac silhouette is mildly enlarged, mediastinal silhouette is unremarkable. Mildly calcified aortic knob. No pleural effusions or focal consolidations. Trachea projects midline and there is no pneumothorax. Soft tissue planes and  included osseous structures are non-suspicious. Thoracolumbar levoscoliosis. Moderate degenerative change of thoracic spine. Severe LEFT glenohumeral osteoarthrosis and loose bodies. IMPRESSION: Mild cardiomegaly, no acute pulmonary process. Electronically Signed   By: Awilda Metro M.D.   On: 10/10/2015 23:20   US Abdomen Complete  Result Date: 10/11/2015 CLINICAL DATA:  Abdominal pain for the past 4 days. Previous cholecystectomy. EXAM: ABDOMEN ULTRASOUND COMPLETE COMPARISON:  Abdomen CT obtained yesterday. FINDINGS: Gallbladder: Surgically absent. Common bile duct: Diameter: 5.1 mm. Liver: No focal lesion identified. Within normal limits in parenchymal echogenicity. IVC: No abnormality visualized. Pancreas: Visualized portion unremarkable. Spleen: Size and appearance within normal limits. Right Kidney: Length: 11.6 cm. Normal echogenicity. Diffuse cortical thinning with prominent renal sinus fat. Left Kidney: Length: 12.0 cm. Normal echogenicity. Mild diffuse cortical thinning. 1.8 cm parapelvic cyst. Abdominal aorta: No aneurysm visualized. Other findings: None. IMPRESSION: 1. No acute abnormality. 2. Mild diffuse bilateral renal cortical atrophy. Electronically Signed   By: Beckie Salts M.D.   On: 10/11/2015 10:49   Ct Abdomen Pelvis W Contrast  Result Date: 10/10/2015 CLINICAL DATA:  Abdominal pain, vomiting, cough. EXAM: CT ABDOMEN AND PELVIS WITH CONTRAST TECHNIQUE: Multidetector CT imaging of the abdomen and pelvis was performed using the standard protocol following bolus administration of intravenous contrast. CONTRAST:  ISOVUE-300 IOPAMIDOL (ISOVUE-300) INJECTION 61% COMPARISON:  CT abdomen pelvis dated 06/14 715 FINDINGS: Lower chest: Lung bases are essentially clear. Hepatobiliary: Liver is within normal limits. Status post cholecystectomy. No intrahepatic or extrahepatic ductal dilatation. Pancreas: Within normal limits. Spleen: Within normal limits. Adrenals/Urinary Tract: Adrenal  glands are within normal limits. Kidneys are notable for bilateral renal sinus cysts. Mild urothelial thickening/enhancement involving the left proximal collecting system (series 2/ image 29). No frank hydronephrosis. Mild wall thickening involving the bladder (series 2/image 60). Stomach/Bowel: Stomach is notable for a tiny hiatal hernia. No evidence of bowel obstruction. Normal appendix (series 2/ image 43). Extensive sigmoid diverticulosis, without evidence of diverticulitis. Vascular/Lymphatic: Atherosclerotic calcifications of the abdominal aorta and branch vessels. No evidence of abdominal aortic aneurysm. No suspicious abdominopelvic lymphadenopathy. Reproductive: Prostatomegaly, with enlargement the central gland which indents the base of the bladder. Other: No abdominopelvic ascites. Small fat containing left inguinal hernia (series 2/image 67). Musculoskeletal: Right hip arthroplasty, without evidence of complication. Moderate degenerative changes of the visualized thoracolumbar spine, with associated lumbar dextroscoliosis. IMPRESSION: No evidence of bowel obstruction.  Normal appendix. Extensive sigmoid diverticulosis, without evidence of diverticulitis. Mildly thick-walled bladder, correlate for cystitis. Associated wall thickening/enhancement involving the left proximal collecting system, possibly reflecting ascending infection. Prostatomegaly, suggesting BPH. Additional ancillary findings as above. Electronically Signed   By: Charline Bills M.D.   On: 10/10/2015 22:33     Labs:   Basic Metabolic Panel:  Recent Labs Lab 10/10/15 1838 10/11/15 0535 10/12/15 0525 10/13/15 0450  NA 139 140 138 141  K 4.1 3.5 3.3* 3.5  CL 105 112* 110 113*  CO2 26 21* 22 23  GLUCOSE 172* 100* 115* 133*  BUN 9 6 5* 7  CREATININE 0.77 0.59* 0.62 0.75  CALCIUM 9.0 7.9* 7.8* 7.8*   GFR Estimated Creatinine Clearance: 57.4 mL/min (by C-G formula based on SCr of  0.75 mg/dL). Liver Function  Tests:  Recent Labs Lab 10/10/15 1838 10/11/15 0535 10/12/15 0525 10/13/15 0450  AST 121* 103* 123* 76*  ALT 277* 208* 183* 158*  ALKPHOS 312* 246* 300* 332*  BILITOT 6.1* 6.2* 6.0* 4.5*  PROT 6.4* 4.8* 4.6* 4.6*  ALBUMIN 3.7 2.8* 2.6* 2.5*    Recent Labs Lab 10/10/15 1838 10/11/15 0836 10/12/15 0525  LIPASE 644* 36 51   No results for input(s): AMMONIA in the last 168 hours. Coagulation profile  Recent Labs Lab 10/11/15 0249  INR 1.18    CBC:  Recent Labs Lab 10/10/15 1838 10/11/15 0535 10/12/15 0525 10/13/15 0450  WBC 10.7* 6.3 5.7 7.1  HGB 15.5 12.9* 12.4* 12.8*  HCT 46.1 38.3* 36.7* 38.1*  MCV 83.8 81.8 81.9 83.6  PLT 179 143* 131* 128*   Cardiac Enzymes: No results for input(s): CKTOTAL, CKMB, CKMBINDEX, TROPONINI in the last 168 hours. BNP: Invalid input(s): POCBNP CBG:  Recent Labs Lab 10/11/15 0758 10/12/15 0904 10/13/15 0734  GLUCAP 97 136* 125*   D-Dimer No results for input(s): DDIMER in the last 72 hours. Hgb A1c No results for input(s): HGBA1C in the last 72 hours. Lipid Profile No results for input(s): CHOL, HDL, LDLCALC, TRIG, CHOLHDL, LDLDIRECT in the last 72 hours. Thyroid function studies No results for input(s): TSH, T4TOTAL, T3FREE, THYROIDAB in the last 72 hours.  Invalid input(s): FREET3 Anemia work up No results for input(s): VITAMINB12, FOLATE, FERRITIN, TIBC, IRON, RETICCTPCT in the last 72 hours. Microbiology Recent Results (from the past 240 hour(s))  Urine culture     Status: Abnormal   Collection Time: 10/10/15  6:00 PM  Result Value Ref Range Status   Specimen Description URINE, CLEAN CATCH  Final   Special Requests NONE  Final   Culture >=100,000 COLONIES/mL ESCHERICHIA COLI (A)  Final   Report Status 10/13/2015 FINAL  Final   Organism ID, Bacteria ESCHERICHIA COLI (A)  Final      Susceptibility   Escherichia coli - MIC*    AMPICILLIN >=32 RESISTANT Resistant     CEFAZOLIN <=4 SENSITIVE Sensitive      CEFTRIAXONE <=1 SENSITIVE Sensitive     CIPROFLOXACIN >=4 RESISTANT Resistant     GENTAMICIN 2 SENSITIVE Sensitive     IMIPENEM <=0.25 SENSITIVE Sensitive     NITROFURANTOIN <=16 SENSITIVE Sensitive     TRIMETH/SULFA <=20 SENSITIVE Sensitive     AMPICILLIN/SULBACTAM 4 SENSITIVE Sensitive     Extended ESBL NEGATIVE Sensitive     * >=100,000 COLONIES/mL ESCHERICHIA COLI  Blood Culture (routine x 2)     Status: None (Preliminary result)   Collection Time: 10/10/15  8:13 PM  Result Value Ref Range Status   Specimen Description BLOOD LEFT FOREARM  Final   Special Requests BOTTLES DRAWN AEROBIC AND ANAEROBIC 5CC  Final   Culture   Final    NO GROWTH 3 DAYS Performed at [facility]    Report Status PENDING  Incomplete  Blood Culture (routine x 2)     Status: None (Preliminary result)   Collection Time: 10/10/15  8:36 PM  Result Value Ref Range Status   Specimen Description BLOOD BLOOD RIGHT WRIST  Final   Special Requests BOTTLES DRAWN AEROBIC AND ANAEROBIC 5CC  Final   Culture   Final    NO GROWTH 3 DAYS Performed at [facility]    Report Status PENDING  Incomplete  Urine culture     Status: None   Collection Time: 10/11/15  3:30  AM  Result Value Ref Range Status   Specimen Description URINE, RANDOM  Final   Special Requests NONE  Final   Culture NO GROWTH Performed at [facility]   Final   Report Status 10/12/2015 FINAL  Final     Discharge Instructions:   Discharge Instructions    Diet - low sodium heart healthy    Complete by:  As directed    Increase activity slowly    Complete by:  As directed        Medication List    STOP taking these medications   azithromycin 250 MG tablet Commonly known as:  ZITHROMAX   doxycycline 100 MG tablet Commonly known as:  VIBRA-TABS     TAKE these medications   benzonatate 100 MG capsule Commonly known as:  TESSALON Take 1 capsule (100 mg total) by mouth 3 (three) times daily as needed for cough.     calcium carbonate 500 MG chewable tablet Commonly known as:  TUMS - dosed in mg elemental calcium Chew 2 tablets by mouth daily as needed for indigestion.   cephALEXin 500 MG capsule Commonly known as:  KEFLEX Take 1 capsule (500 mg total) by mouth every 12 (twelve) hours.   clobetasol cream 0.05 % Commonly known as:  TEMOVATE Apply 1 application topically 2 (two) times daily. For whelps/itching   co-enzyme Q-10 30 MG capsule Take 200 mg by mouth daily.   cyanocobalamin 1000 MCG/ML injection Commonly known as:  (VITAMIN B-12) Inject 1 mL (1,000 mcg total) into the muscle every 30 (thirty) days.   dextromethorphan-guaiFENesin 30-600 MG 12hr tablet Commonly known as:  MUCINEX DM Take 1 tablet by mouth 2 (two) times daily as needed for cough.   Fish Oil 1200 MG Caps Take 1,200 mg by mouth daily.   magic mouthwash Soln Take 5 mLs by mouth 3 (three) times daily as needed for mouth pain.   metoprolol succinate 25 MG 24 hr tablet Commonly known as:  TOPROL-XL Take 1 tablet (25 mg total) by mouth daily.   mometasone 50 MCG/ACT nasal spray Commonly known as:  NASONEX Place 2 sprays into the nose daily as needed (congestion).   nitroGLYCERIN 0.4 MG SL tablet Commonly known as:  NITROSTAT Place 1 tablet (0.4 mg total) under the tongue every 5 (five) minutes as needed. Up to 3 doses   ondansetron 4 MG disintegrating tablet Commonly known as:  ZOFRAN-ODT Take 4 mg by mouth every 8 (eight) hours as needed for nausea or vomiting.   oxyCODONE 5 MG immediate release tablet Commonly known as:  Oxy IR/ROXICODONE Take 1 tablet (5 mg total) by mouth every 4 (four) hours as needed for moderate pain.   tamsulosin 0.4 MG Caps capsule Commonly known as:  FLOMAX Take 0.4 mg by mouth daily after supper.      Follow-up Information    Brandon Mannan, MD Follow up in 1 week(s).   Specialty:  Family Medicine Contact information: [address] [place] [address] [phone]             Time coordinating discharge: 35 min  Signed:  Audree Schrecengost U Luca Burston   [facility] 10/14/2015, 12:30 PM

## 2015-10-15 ENCOUNTER — Telehealth: Payer: Self-pay

## 2015-10-15 LAB — CULTURE, BLOOD (ROUTINE X 2)
Culture: NO GROWTH
Culture: NO GROWTH

## 2015-10-15 NOTE — Telephone Encounter (Signed)
Transition Care Management Follow-up Telephone Call    Date discharged? 10/14/2015  How have you been since you were released from the hospital? [place].   Any patient concerns? Pt is still experiencing cough and generalized weakness. Taking medications as prescribed.    Items Reviewed:  Medications reviewed: Yes  Allergies reviewed: Yes  Dietary changes reviewed: Yes  Referrals reviewed: N/A   Functional Questionnaire:  Independent - I Dependent - D    Activities of Daily Living (ADLs):    Personal hygiene - I Dressing - I Eating - I Maintaining continence - urinary incontinence Transferring - I   Independent Activities of Daily Living (iADLs): Basic communication skills - I Transportation - I Meal preparation  - I Shopping - I Housework - I  Managing medications - I  Managing personal finances - I   Confirmed importance and date/time of follow-up visits scheduled YES  Provider Appointment booked with PCP 10/21/2015  Confirmed with patient if condition begins to worsen call PCP or go to the ER.  Patient was given the office number and encouraged to call back with question or concerns: YES

## 2015-10-21 ENCOUNTER — Ambulatory Visit (INDEPENDENT_AMBULATORY_CARE_PROVIDER_SITE_OTHER): Payer: Medicare Other | Admitting: Family Medicine

## 2015-10-21 ENCOUNTER — Encounter: Payer: Self-pay | Admitting: Family Medicine

## 2015-10-21 VITALS — BP 126/72 | HR 75 | Temp 98.3°F | Wt 142.0 lb

## 2015-10-21 DIAGNOSIS — R17 Unspecified jaundice: Secondary | ICD-10-CM | POA: Diagnosis not present

## 2015-10-21 DIAGNOSIS — R05 Cough: Secondary | ICD-10-CM

## 2015-10-21 DIAGNOSIS — K8043 Calculus of bile duct with acute cholecystitis with obstruction: Secondary | ICD-10-CM

## 2015-10-21 DIAGNOSIS — Z09 Encounter for follow-up examination after completed treatment for conditions other than malignant neoplasm: Secondary | ICD-10-CM

## 2015-10-21 DIAGNOSIS — N390 Urinary tract infection, site not specified: Secondary | ICD-10-CM

## 2015-10-21 DIAGNOSIS — A419 Sepsis, unspecified organism: Secondary | ICD-10-CM

## 2015-10-21 DIAGNOSIS — K859 Acute pancreatitis without necrosis or infection, unspecified: Secondary | ICD-10-CM

## 2015-10-21 DIAGNOSIS — R350 Frequency of micturition: Secondary | ICD-10-CM | POA: Diagnosis not present

## 2015-10-21 DIAGNOSIS — R059 Cough, unspecified: Secondary | ICD-10-CM

## 2015-10-21 DIAGNOSIS — N401 Enlarged prostate with lower urinary tract symptoms: Secondary | ICD-10-CM | POA: Diagnosis not present

## 2015-10-21 DIAGNOSIS — R1013 Epigastric pain: Secondary | ICD-10-CM

## 2015-10-21 LAB — COMPREHENSIVE METABOLIC PANEL
ALBUMIN: 3 g/dL — AB (ref 3.5–5.2)
ALT: 47 U/L (ref 0–53)
AST: 28 U/L (ref 0–37)
Alkaline Phosphatase: 179 U/L — ABNORMAL HIGH (ref 39–117)
BUN: 9 mg/dL (ref 6–23)
CHLORIDE: 103 meq/L (ref 96–112)
CO2: 31 mEq/L (ref 19–32)
CREATININE: 0.77 mg/dL (ref 0.40–1.50)
Calcium: 8.3 mg/dL — ABNORMAL LOW (ref 8.4–10.5)
GFR: 103.3 mL/min (ref >60.00)
Glucose, Bld: 86 mg/dL (ref 70–99)
Potassium: 4 mEq/L (ref 3.5–5.1)
SODIUM: 141 meq/L (ref 135–145)
Total Bilirubin: 1.7 mg/dL — ABNORMAL HIGH (ref 0.2–1.2)
Total Protein: 5.8 g/dL — ABNORMAL LOW (ref 6.0–8.3)

## 2015-10-21 LAB — LIPASE: LIPASE: 13 U/L (ref 11.0–59.0)

## 2015-10-21 NOTE — Assessment & Plan Note (Signed)
Resolving. Lipase was trending down and has had no further abdominal pain or vomiting. Recheck Lipase today.

## 2015-10-21 NOTE — Progress Notes (Signed)
Pre visit review using our clinic review tool, if applicable. No additional management support is needed unless otherwise documented below in the visit note. 

## 2015-10-21 NOTE — Assessment & Plan Note (Signed)
Persistent. Likely due to PND- seasonal allergies. S/p Zack, keflex and CXR neg.

## 2015-10-21 NOTE — Assessment & Plan Note (Signed)
Urine cx neg. Finished abx. Still having dysuria. Has appt with urology this afternoon.

## 2015-10-21 NOTE — Assessment & Plan Note (Signed)
Resolved

## 2015-10-21 NOTE — Assessment & Plan Note (Signed)
S/p ERCP- no stones found. ? Already passed. Call or return to clinic prn if these symptoms worsen or fail to improve as anticipated.

## 2015-10-21 NOTE — Progress Notes (Signed)
Subjective:   Patient ID: [name], male    DOB: [birth year], 82 y.o.   MRN: [id number]  [name] is a pleasant 82 y.o. year old male who presents to clinic today with Hospitalization Follow-up and Cough  on 10/21/2015  HPI:  Admitted 9/22- 10/14/15.  Notes reviewed.  Presented with 3 day h/o abdominal pain associated with n/v and abdominal distension without diarrhea. Daughter noticed he appeared jaundice as well.   Pain was located around the umbilicus and was constant, 8/10 and aggravated by eating.  He also complained of dysuria.  No fever.  Had been taking a zpack at time of admission for URI.  LFTs and lipase were elevated- possible choledocholithiasis.  GI was consulted. ERCP done- no stone seen. Given IVFs for pancreatitis and was eating well prior to D/c and lipase was trending down. Lab Results  Component Value Date   ALT 158 (H) 10/13/2015   AST 76 (H) 10/13/2015   ALKPHOS 332 (H) 10/13/2015   BILITOT 4.5 (H) 10/13/2015   Lab Results  Component Value Date   LIPASE 51 10/12/2015     UA pos- sent home on keflex. Urine cx showed no growth.  Lab Results  Component Value Date   WBC 7.1 10/13/2015   HGB 12.8 (L) 10/13/2015   HCT 38.1 (L) 10/13/2015   MCV 83.6 10/13/2015   PLT 128 (L) 10/13/2015   Feeling better.  Still has a cough.  Dg Chest 2 View  Result Date: 10/10/2015 CLINICAL DATA:  Abdominal pain, vomiting and cough for 4 days, on antibiotics. Abdominal pain. History of hypertension, hyperlipidemia. EXAM: CHEST  2 VIEW COMPARISON:  Chest radiograph January 31, 2015 FINDINGS: The cardiac silhouette is mildly enlarged, mediastinal silhouette is unremarkable. Mildly calcified aortic knob. No pleural effusions or focal consolidations. Trachea projects midline and there is no pneumothorax. Soft tissue planes and included osseous structures are non-suspicious. Thoracolumbar levoscoliosis. Moderate degenerative change of thoracic spine. Severe  LEFT glenohumeral osteoarthrosis and loose bodies. IMPRESSION: Mild cardiomegaly, no acute pulmonary process. Electronically Signed   By: Elon Alas M.D.   On: 10/10/2015 23:20   US Abdomen Complete  Result Date: 10/11/2015 CLINICAL DATA:  Abdominal pain for the past 4 days. Previous cholecystectomy. EXAM: ABDOMEN ULTRASOUND COMPLETE COMPARISON:  Abdomen CT obtained yesterday. FINDINGS: Gallbladder: Surgically absent. Common bile duct: Diameter: 5.1 mm. Liver: No focal lesion identified. Within normal limits in parenchymal echogenicity. IVC: No abnormality visualized. Pancreas: Visualized portion unremarkable. Spleen: Size and appearance within normal limits. Right Kidney: Length: 11.6 cm. Normal echogenicity. Diffuse cortical thinning with prominent renal sinus fat. Left Kidney: Length: 12.0 cm. Normal echogenicity. Mild diffuse cortical thinning. 1.8 cm parapelvic cyst. Abdominal aorta: No aneurysm visualized. Other findings: None. IMPRESSION: 1. No acute abnormality. 2. Mild diffuse bilateral renal cortical atrophy. Electronically Signed   By: Claudie Revering M.D.   On: 10/11/2015 10:49   Ct Abdomen Pelvis W Contrast  Result Date: 10/10/2015 CLINICAL DATA:  Abdominal pain, vomiting, cough. EXAM: CT ABDOMEN AND PELVIS WITH CONTRAST TECHNIQUE: Multidetector CT imaging of the abdomen and pelvis was performed using the standard protocol following bolus administration of intravenous contrast. CONTRAST:  126m ISOVUE-300 IOPAMIDOL (ISOVUE-300) INJECTION 61% COMPARISON:  CT abdomen pelvis dated 06/14 715 FINDINGS: Lower chest: Lung bases are essentially clear. Hepatobiliary: Liver is within normal limits. Status post cholecystectomy. No intrahepatic or extrahepatic ductal dilatation. Pancreas: Within normal limits. Spleen: Within normal limits. Adrenals/Urinary Tract: Adrenal glands are within normal limits. Kidneys are notable  for bilateral renal sinus cysts. Mild urothelial thickening/enhancement involving  the left proximal collecting system (series 2/ image 29). No frank hydronephrosis. Mild wall thickening involving the bladder (series 2/image 60). Stomach/Bowel: Stomach is notable for a tiny hiatal hernia. No evidence of bowel obstruction. Normal appendix (series 2/ image 43). Extensive sigmoid diverticulosis, without evidence of diverticulitis. Vascular/Lymphatic: Atherosclerotic calcifications of the abdominal aorta and branch vessels. No evidence of abdominal aortic aneurysm. No suspicious abdominopelvic lymphadenopathy. Reproductive: Prostatomegaly, with enlargement the central gland which indents the base of the bladder. Other: No abdominopelvic ascites. Small fat containing left inguinal hernia (series 2/image 67). Musculoskeletal: Right hip arthroplasty, without evidence of complication. Moderate degenerative changes of the visualized thoracolumbar spine, with associated lumbar dextroscoliosis. IMPRESSION: No evidence of bowel obstruction.  Normal appendix. Extensive sigmoid diverticulosis, without evidence of diverticulitis. Mildly thick-walled bladder, correlate for cystitis. Associated wall thickening/enhancement involving the left proximal collecting system, possibly reflecting ascending infection. Prostatomegaly, suggesting BPH. Additional ancillary findings as above. Electronically Signed   By: Julian Hy M.D.   On: 10/10/2015 22:33   Dg Ercp  Result Date: 10/12/2015 CLINICAL DATA:  ERCP for common bile stone; EXAM: ERCP TECHNIQUE: Multiple spot images obtained with the fluoroscopic device and submitted for interpretation post-procedure. FLUOROSCOPY TIME:  Fluoroscopy Time: 1 min 40 sec fluoro time, 9.27 mGy COMPARISON:  Ultrasound 10/11/2015 and previous FINDINGS: Single fluoroscopic spot image documents endoscopic catheterization in partial opacification of the biliary tree. The CBD is nondilated. Distal CBD is obstructed by the overlying scope. Intrahepatic ducts incompletely opacified.  IMPRESSION: Endoscopic CBD intervention. These images were submitted for radiologic interpretation only. Please see the procedural report for the amount of contrast and the fluoroscopy time utilized. Electronically Signed   By: Lucrezia Europe M.D.   On: 10/12/2015 10:10     Current Outpatient Prescriptions on File Prior to Visit  Medication Sig Dispense Refill  . benzonatate (TESSALON) 100 MG capsule Take 1 capsule (100 mg total) by mouth 3 (three) times daily as needed for cough. 20 capsule 0  . calcium carbonate (TUMS - DOSED IN MG ELEMENTAL CALCIUM) 500 MG chewable tablet Chew 2 tablets by mouth daily as needed for indigestion.     . clobetasol cream (TEMOVATE) 1.24 % Apply 1 application topically 2 (two) times daily. For whelps/itching    . co-enzyme Q-10 30 MG capsule Take 200 mg by mouth daily.    . cyanocobalamin (,VITAMIN B-12,) 1000 MCG/ML injection Inject 1 mL (1,000 mcg total) into the muscle every 30 (thirty) days. 1 mL 0  . dextromethorphan-guaiFENesin (MUCINEX DM) 30-600 MG 12hr tablet Take 1 tablet by mouth 2 (two) times daily as needed for cough.    . magic mouthwash SOLN Take 5 mLs by mouth 3 (three) times daily as needed for mouth pain. 30 mL 0  . metoprolol succinate (TOPROL-XL) 25 MG 24 hr tablet Take 1 tablet (25 mg total) by mouth daily. 30 tablet 6  . mometasone (NASONEX) 50 MCG/ACT nasal spray Place 2 sprays into the nose daily as needed (congestion).     . nitroGLYCERIN (NITROSTAT) 0.4 MG SL tablet Place 1 tablet (0.4 mg total) under the tongue every 5 (five) minutes as needed. Up to 3 doses 25 tablet 11  . Omega-3 Fatty Acids (FISH OIL) 1200 MG CAPS Take 1,200 mg by mouth daily.    . ondansetron (ZOFRAN-ODT) 4 MG disintegrating tablet Take 4 mg by mouth every 8 (eight) hours as needed for nausea or vomiting.    Marland Kitchen oxyCODONE (  OXY IR/ROXICODONE) 5 MG immediate release tablet Take 1 tablet (5 mg total) by mouth every 4 (four) hours as needed for moderate pain. 5 tablet 0  .  Tamsulosin HCl (FLOMAX) 0.4 MG CAPS Take 0.4 mg by mouth daily after supper.     No current facility-administered medications on file prior to visit.     Allergies  Allergen Reactions  . Penicillins Other (See Comments)    Whelps, passed out Tolerates cephalosporins  . Sulfamethoxazole-Trimethoprim Rash  . Sulfonamide Derivatives Rash    Past Medical History:  Diagnosis Date  . Abnormal LFTs   . B12 deficiency   . CHF (congestive heart failure) ([place])   . Choledocholithiasis with obstruction 07/02/2013  . Cluster headache   . Coronary atherosclerosis of unspecified type of vessel, native or graft   . Degeneration of intervertebral disc, site unspecified   . Diverticulosis of colon (without mention of hemorrhage)   . Hyperlipidemia   . Hypertension   . Hypertrophy of prostate without urinary obstruction and other lower urinary tract symptoms (LUTS)   . Monocytosis 01/01/2014  . Nephrolithiasis   . Neurogenic dysphagia   . Osteoarthritis   . Pernicious anemia   . Personal history of colonic polyps    adenomatous  . Prostate enlargement    urinary frequency, nocturia  . Thrombocytopenia, unspecified 09/01/2012  . Unspecified sleep apnea    does not have cpap-was not able to tolerate    Past Surgical History:  Procedure Laterality Date  . BIOPSY PROSTATE  2008   Benign  . BONE MARROW BIOPSY  2011  . CHOLECYSTECTOMY  11/24/2010   Procedure: LAPAROSCOPIC CHOLECYSTECTOMY WITH INTRAOPERATIVE CHOLANGIOGRAM;  Surgeon: Earnstine Regal, MD;  Location: [facility];  Service: General;  Laterality: N/A;  c-arm  . ERCP N/A 07/04/2013   Procedure: ENDOSCOPIC RETROGRADE CHOLANGIOPANCREATOGRAPHY (ERCP);  Surgeon: Gatha Mayer, MD;  Location: [facility];  Service: Endoscopy;  Laterality: N/A;  MAC if available  . HERNIA REPAIR  2004   RIH  . inguinal herniorrhapy  2003  . KNEE ARTHROSCOPY     5 times  . NASAL ENDOSCOPY  2012  . ROTATOR CUFF REPAIR Right 2006  . TONSILLECTOMY    . TOTAL  HIP ARTHROPLASTY Right 2011  . TOTAL KNEE ARTHROPLASTY  2003  . UVULECTOMY  1993    Family History  Problem Relation Age of Onset  . Colon cancer Father 34  . Peripheral vascular disease Mother     Social History   Social History  . Marital status: Widowed    Spouse name: N/A  . Number of children: 2  . Years of education: N/A   Occupational History  . retired Retired   Social History Main Topics  . Smoking status: Former Smoker    Types: Pipe    Quit date: 01/18/1966  . Smokeless tobacco: Never Used  . Alcohol use No  . Drug use: No  . Sexual activity: Not on file   Other Topics Concern  . Not on file   Social History Narrative   DNR   Widowed   2 daughters leave near by   The PMH, PSH, Social History, Family History, Medications, and allergies have been reviewed in [facility], and have been updated if relevant.   Review of Systems  Constitutional: Negative.   HENT: Negative.   Respiratory: Positive for cough. Negative for shortness of breath and stridor.   Cardiovascular: Negative.   Gastrointestinal: Negative.   Endocrine: Negative.   Genitourinary: Positive  for dysuria.  Musculoskeletal: Negative.   Allergic/Immunologic: Negative.   Neurological: Negative.   Hematological: Negative.   Psychiatric/Behavioral: Negative.   All other systems reviewed and are negative.      Objective:    BP 126/72   Pulse 75   Temp 98.3 F (36.8 C) (Oral)   Wt 142 lb (64.4 kg)   SpO2 95%   BMI 27.73 kg/m    Physical Exam  Constitutional: He is oriented to person, place, and time. He appears well-developed and well-nourished. No distress.  Eyes: Conjunctivae are normal. No scleral icterus.  Pulmonary/Chest: Effort normal.  Abdominal: Soft. Bowel sounds are normal. He exhibits no distension and no mass. There is tenderness. There is no rebound and no guarding.  Musculoskeletal: Normal range of motion.  Neurological: He is alert and oriented to person, place, and time.  No cranial nerve deficit.  Skin: Skin is warm and dry. He is not diaphoretic.  No jaundice  Psychiatric: He has a normal mood and affect. His behavior is normal. Judgment and thought content normal.  Nursing note and vitals reviewed.         Assessment & Plan:   Sepsis, due to unspecified organism [facility]) - Plan: Comprehensive metabolic panel  Epigastric pain  Jaundice - Plan: Comprehensive metabolic panel  Lower urinary tract infectious disease  Acute pancreatitis, unspecified complication status, unspecified pancreatitis type - Plan: Lipase No Follow-up on file.

## 2015-10-21 NOTE — Assessment & Plan Note (Signed)
Resolved. Likely secondary to stone that dislodged. LFTs were trending down, no jaundice visible on exam. Recheck CMET today.

## 2015-10-21 NOTE — Patient Instructions (Signed)
Great to see you. We will call you with your results. Please update me after your urology appointment.

## 2015-10-22 ENCOUNTER — Encounter: Payer: Self-pay | Admitting: *Deleted

## 2015-10-22 ENCOUNTER — Telehealth: Payer: Self-pay | Admitting: *Deleted

## 2015-10-22 DIAGNOSIS — R627 Adult failure to thrive: Secondary | ICD-10-CM

## 2015-10-22 NOTE — Telephone Encounter (Signed)
Spoke to pts daughter and informed her of pts results. Pt states that she believes that the pt may be concerned about his weight and is reducing his food intake dramatically. She also states that he is not intaking fluids due to his urinary issues but is currently seeing a urologist, but she is concerned about his eating habits.

## 2015-10-23 ENCOUNTER — Ambulatory Visit (INDEPENDENT_AMBULATORY_CARE_PROVIDER_SITE_OTHER): Payer: Medicare Other | Admitting: Primary Care

## 2015-10-23 ENCOUNTER — Encounter: Payer: Self-pay | Admitting: Primary Care

## 2015-10-23 VITALS — BP 126/76 | HR 63 | Temp 98.0°F | Ht 60.25 in | Wt 139.4 lb

## 2015-10-23 DIAGNOSIS — K121 Other forms of stomatitis: Secondary | ICD-10-CM

## 2015-10-23 NOTE — Telephone Encounter (Signed)
Spoke to pts daughter who states pt will benefit from nutritionist. Advised daughter to await a call with appt details

## 2015-10-23 NOTE — Telephone Encounter (Signed)
Referral placed.

## 2015-10-23 NOTE — Progress Notes (Signed)
Pre visit review using our clinic review tool, if applicable. No additional management support is needed unless otherwise documented below in the visit note. 

## 2015-10-23 NOTE — Telephone Encounter (Signed)
Does she think he would benefit from a referral to a nutritionist?

## 2015-10-23 NOTE — Patient Instructions (Addendum)
Swish, gargle, and spit 5 ml of the Magic Mouthwash three times daily as needed for mouth pain. Please notify us if you run out of this medication too soon.  Start taking the [employer] brand of Claritin everyday until we get out of allergy season.   It was a pleasure meeting you!

## 2015-10-23 NOTE — Progress Notes (Signed)
Subjective:    Patient ID: [name], male    DOB: [birth year], 82 y.o.   MRN: [id number]  HPI  [name] is an 82 year old male who presents today with a chief complaint of oral sore. The sore is located to the hard palate of his upper mouth and has been present for the past 1-2 weeks for which he developed while in the hospital.  He was prescribed Magic Mouthwash in late September after his hospital discharge. He was admittied for abdominal pain with sepsis, pancreatitis, and UTI. He didn't feel like he got enough of the Magic Mouthwash and has been applying the wash with a q-tip to his entire oral cavity. Denies fevers, chills, dental pain. Overall he's feeling improved.  Review of Systems  Constitutional: Negative for chills and fever.  HENT: Negative for congestion, postnasal drip and sore throat.        Oral cavity sore  Skin: Negative for color change.       Past Medical History:  Diagnosis Date  . Abnormal LFTs   . B12 deficiency   . CHF (congestive heart failure) ([place])   . Choledocholithiasis with obstruction 07/02/2013  . Cluster headache   . Coronary atherosclerosis of unspecified type of vessel, native or graft   . Degeneration of intervertebral disc, site unspecified   . Diverticulosis of colon (without mention of hemorrhage)   . Hyperlipidemia   . Hypertension   . Hypertrophy of prostate without urinary obstruction and other lower urinary tract symptoms (LUTS)   . Monocytosis 01/01/2014  . Nephrolithiasis   . Neurogenic dysphagia   . Osteoarthritis   . Pernicious anemia   . Personal history of colonic polyps    adenomatous  . Prostate enlargement    urinary frequency, nocturia  . Thrombocytopenia, unspecified 09/01/2012  . Unspecified sleep apnea    does not have cpap-was not able to tolerate     Social History   Social History  . Marital status: Widowed    Spouse name: N/A  . Number of children: 2  . Years of education: N/A   Occupational  History  . retired Retired   Social History Main Topics  . Smoking status: Former Smoker    Types: Pipe    Quit date: 01/18/1966  . Smokeless tobacco: Never Used  . Alcohol use No  . Drug use: No  . Sexual activity: Not on file   Other Topics Concern  . Not on file   Social History Narrative   DNR   Widowed   2 daughters leave near by    Past Surgical History:  Procedure Laterality Date  . BIOPSY PROSTATE  2008   Benign  . BONE MARROW BIOPSY  2011  . CHOLECYSTECTOMY  11/24/2010   Procedure: LAPAROSCOPIC CHOLECYSTECTOMY WITH INTRAOPERATIVE CHOLANGIOGRAM;  Surgeon: Earnstine Regal, MD;  Location: [facility];  Service: General;  Laterality: N/A;  c-arm  . ERCP N/A 07/04/2013   Procedure: ENDOSCOPIC RETROGRADE CHOLANGIOPANCREATOGRAPHY (ERCP);  Surgeon: Gatha Mayer, MD;  Location: [facility];  Service: Endoscopy;  Laterality: N/A;  MAC if available  . HERNIA REPAIR  2004   RIH  . inguinal herniorrhapy  2003  . KNEE ARTHROSCOPY     5 times  . NASAL ENDOSCOPY  2012  . ROTATOR CUFF REPAIR Right 2006  . TONSILLECTOMY    . TOTAL HIP ARTHROPLASTY Right 2011  . TOTAL KNEE ARTHROPLASTY  2003  . [place] History  Problem Relation Age of Onset  . Colon cancer Father 76  . Peripheral vascular disease Mother     Allergies  Allergen Reactions  . Penicillins Other (See Comments)    Whelps, passed out Tolerates cephalosporins  . Sulfamethoxazole-Trimethoprim Rash  . Sulfonamide Derivatives Rash    Current Outpatient Prescriptions on File Prior to Visit  Medication Sig Dispense Refill  . calcium carbonate (TUMS - DOSED IN MG ELEMENTAL CALCIUM) 500 MG chewable tablet Chew 2 tablets by mouth daily as needed for indigestion.     . clobetasol cream (TEMOVATE) 6.56 % Apply 1 application topically 2 (two) times daily. For whelps/itching    . co-enzyme Q-10 30 MG capsule Take 200 mg by mouth daily.    . cyanocobalamin (,VITAMIN B-12,) 1000 MCG/ML injection Inject 1 mL  (1,000 mcg total) into the muscle every 30 (thirty) days. 1 mL 0  . magic mouthwash SOLN Take 5 mLs by mouth 3 (three) times daily as needed for mouth pain. 30 mL 0  . metoprolol succinate (TOPROL-XL) 25 MG 24 hr tablet Take 1 tablet (25 mg total) by mouth daily. 30 tablet 6  . mometasone (NASONEX) 50 MCG/ACT nasal spray Place 2 sprays into the nose daily as needed (congestion).     . nitroGLYCERIN (NITROSTAT) 0.4 MG SL tablet Place 1 tablet (0.4 mg total) under the tongue every 5 (five) minutes as needed. Up to 3 doses 25 tablet 11  . Omega-3 Fatty Acids (FISH OIL) 1200 MG CAPS Take 1,200 mg by mouth daily.    Marland Kitchen oxyCODONE (OXY IR/ROXICODONE) 5 MG immediate release tablet Take 1 tablet (5 mg total) by mouth every 4 (four) hours as needed for moderate pain. 5 tablet 0  . Tamsulosin HCl (FLOMAX) 0.4 MG CAPS Take 0.4 mg by mouth daily after supper.    Marland Kitchen dextromethorphan-guaiFENesin (MUCINEX DM) 30-600 MG 12hr tablet Take 1 tablet by mouth 2 (two) times daily as needed for cough.     No current facility-administered medications on file prior to visit.     BP 126/76   Pulse 63   Temp 98 F (36.7 C) (Oral)   Ht 5' 0.25" (1.53 m)   Wt 139 lb 6.4 oz (63.2 kg)   SpO2 97%   BMI 27.00 kg/m    Objective:   Physical Exam  Constitutional: He appears well-nourished.  HENT:  Right Ear: Tympanic membrane and ear canal normal.  Left Ear: Tympanic membrane and ear canal normal.  Nose: No mucosal edema. Right sinus exhibits no maxillary sinus tenderness and no frontal sinus tenderness. Left sinus exhibits no maxillary sinus tenderness and no frontal sinus tenderness.  Mouth/Throat: Oropharynx is clear and moist.  Small well-healing ulcer to hard palate upper mouth. No drainage or erythema present.  Eyes: Conjunctivae are normal.  Neck: Neck supple.  Cardiovascular: Normal rate and regular rhythm.   Pulmonary/Chest: Effort normal and breath sounds normal.  Skin: Skin is warm and dry.            Assessment & Plan:  Oral ulcer:  Located to hard palate of upper mouth since hospitalization. Overall has noticed improvement but delayed healing. He is using Magic mouthwash very sparingly. Discussed appropriate use of Magic mouthwash. He has a refill remaining at the pharmacy. Exam today without evidence of acute bacterial involvement or abscess. Return precautions provided.  Sheral Flow, NP

## 2015-10-27 ENCOUNTER — Encounter (HOSPITAL_COMMUNITY): Payer: Self-pay | Admitting: Gastroenterology

## 2015-10-28 ENCOUNTER — Ambulatory Visit (INDEPENDENT_AMBULATORY_CARE_PROVIDER_SITE_OTHER): Payer: Medicare Other | Admitting: Primary Care

## 2015-10-28 VITALS — BP 124/76 | HR 75 | Temp 98.3°F | Ht 60.25 in | Wt 134.1 lb

## 2015-10-28 DIAGNOSIS — K121 Other forms of stomatitis: Secondary | ICD-10-CM

## 2015-10-28 DIAGNOSIS — R300 Dysuria: Secondary | ICD-10-CM | POA: Diagnosis not present

## 2015-10-28 LAB — POC URINALSYSI DIPSTICK (AUTOMATED)
BILIRUBIN UA: NEGATIVE
GLUCOSE UA: NEGATIVE
Ketones, UA: NEGATIVE
Leukocytes, UA: NEGATIVE
NITRITE UA: NEGATIVE
Protein, UA: NEGATIVE
RBC UA: NEGATIVE
UROBILINOGEN UA: NEGATIVE
pH, UA: 6

## 2015-10-28 MED ORDER — TRIAMCINOLONE ACETONIDE 0.1 % MT PSTE: PASTE | OROMUCOSAL | 0 refills | Status: DC

## 2015-10-28 MED ORDER — LIDOCAINE VISCOUS 2 % MT SOLN: OROMUCOSAL | 0 refills | Status: DC

## 2015-10-28 NOTE — Progress Notes (Signed)
Subjective:    Patient ID: [name], male    DOB: 01-01-1934, 82 y.o.   MRN: [id number]  HPI  [name] is an 82 year old male who presents today with a chief complaint of blister to the roof of his mouth. He was evaluated on 10/23/15 for same complaint. His blister has been present since his hospital discharge. Last visit his blister was nearly healed. He was prescribed Magic Mouthwash during his hospitalization and was rubbing this onto his blister with a q-tip rather than gargling as prescribed. He had plenty of Magic Mouthwash during his last visit.   Since his last visit he's felt fatigued. The roof of his mouth is sore all of the time. He sleeps a lot, has had a decrease in appetite as he has no taste. He's also noticed post nasal drip. He did follow up with his Urologist after his hospital discharge for dysuria and frequency. He has noticed mild dysuria over the past 2 days. Denies cough, shortness of breath, increased frequency, fevers.  Wt Readings from Last 3 Encounters:  10/28/15 134 lb 1.9 oz (60.8 kg)  10/23/15 139 lb 6.4 oz (63.2 kg)  10/21/15 142 lb (64.4 kg)     Review of Systems  Constitutional: Positive for appetite change and fatigue. Negative for chills and fever.  HENT: Positive for postnasal drip. Negative for congestion and sore throat.        Oral ulcer  Respiratory: Positive for cough. Negative for shortness of breath.   Cardiovascular: Negative for chest pain.       Past Medical History:  Diagnosis Date  . Abnormal LFTs   . B12 deficiency   . CHF (congestive heart failure) ([place])   . Choledocholithiasis with obstruction 07/02/2013  . Cluster headache   . Coronary atherosclerosis of unspecified type of vessel, native or graft   . Degeneration of intervertebral disc, site unspecified   . Diverticulosis of colon (without mention of hemorrhage)   . Hyperlipidemia   . Hypertension   . Hypertrophy of prostate without urinary obstruction and other  lower urinary tract symptoms (LUTS)   . Monocytosis 01/01/2014  . Nephrolithiasis   . Neurogenic dysphagia   . Osteoarthritis   . Pernicious anemia   . Personal history of colonic polyps    adenomatous  . Prostate enlargement    urinary frequency, nocturia  . Thrombocytopenia, unspecified 09/01/2012  . Unspecified sleep apnea    does not have cpap-was not able to tolerate     Social History   Social History  . Marital status: Widowed    Spouse name: N/A  . Number of children: 2  . Years of education: N/A   Occupational History  . retired Retired   Social History Main Topics  . Smoking status: Former Smoker    Types: Pipe    Quit date: 01/18/1966  . Smokeless tobacco: Never Used  . Alcohol use No  . Drug use: No  . Sexual activity: Not on file   Other Topics Concern  . Not on file   Social History Narrative   DNR   Widowed   2 daughters leave near by    Past Surgical History:  Procedure Laterality Date  . BIOPSY PROSTATE  2008   Benign  . BONE MARROW BIOPSY  2011  . CHOLECYSTECTOMY  11/24/2010   Procedure: LAPAROSCOPIC CHOLECYSTECTOMY WITH INTRAOPERATIVE CHOLANGIOGRAM;  Surgeon: Earnstine Regal, MD;  Location: [facility];  Service: General;  Laterality: N/A;  c-arm  .  ERCP N/A 07/04/2013   Procedure: ENDOSCOPIC RETROGRADE CHOLANGIOPANCREATOGRAPHY (ERCP);  Surgeon: Gatha Mayer, MD;  Location: [facility];  Service: Endoscopy;  Laterality: N/A;  MAC if available  . ERCP N/A 10/12/2015   Procedure: ENDOSCOPIC RETROGRADE CHOLANGIOPANCREATOGRAPHY (ERCP);  Surgeon: [name], MD;  Location: [facility];  Service: Endoscopy;  Laterality: N/A;  . HERNIA REPAIR  2004   [place]  . inguinal herniorrhapy  2003  . KNEE ARTHROSCOPY     5 times  . NASAL ENDOSCOPY  2012  . ROTATOR CUFF REPAIR Right 2006  . TONSILLECTOMY    . TOTAL HIP ARTHROPLASTY Right 2011  . TOTAL KNEE ARTHROPLASTY  2003  . UVULECTOMY  1993    Family History  Problem Relation Age of Onset  . Colon cancer  Father 21  . Peripheral vascular disease Mother     Allergies  Allergen Reactions  . Penicillins Other (See Comments)    [place], passed out Tolerates cephalosporins  . Sulfamethoxazole-Trimethoprim Rash  . Sulfonamide Derivatives Rash    Current Outpatient Prescriptions on File Prior to Visit  Medication Sig Dispense Refill  . calcium carbonate (TUMS - DOSED IN MG ELEMENTAL CALCIUM) 500 MG chewable tablet Chew 2 tablets by mouth daily as needed for indigestion.     . clobetasol cream (TEMOVATE) 3.29 % Apply 1 application topically 2 (two) times daily. For whelps/itching    . co-enzyme Q-10 30 MG capsule Take 200 mg by mouth daily.    . cyanocobalamin (,VITAMIN B-12,) 1000 MCG/ML injection Inject 1 mL (1,000 mcg total) into the muscle every 30 (thirty) days. 1 mL 0  . dextromethorphan-guaiFENesin (MUCINEX DM) 30-600 MG 12hr tablet Take 1 tablet by mouth 2 (two) times daily as needed for cough.    . magic mouthwash SOLN Take 5 mLs by mouth 3 (three) times daily as needed for mouth pain. 30 mL 0  . metoprolol succinate (TOPROL-XL) 25 MG 24 hr tablet Take 1 tablet (25 mg total) by mouth daily. 30 tablet 6  . mometasone (NASONEX) 50 MCG/ACT nasal spray Place 2 sprays into the nose daily as needed (congestion).     . nitroGLYCERIN (NITROSTAT) 0.4 MG SL tablet Place 1 tablet (0.4 mg total) under the tongue every 5 (five) minutes as needed. Up to 3 doses 25 tablet 11  . Omega-3 Fatty Acids (FISH OIL) 1200 MG CAPS Take 1,200 mg by mouth daily.    Marland Kitchen oxyCODONE (OXY IR/ROXICODONE) 5 MG immediate release tablet Take 1 tablet (5 mg total) by mouth every 4 (four) hours as needed for moderate pain. 5 tablet 0  . Tamsulosin HCl (FLOMAX) 0.4 MG CAPS Take 0.4 mg by mouth daily after supper.     No current facility-administered medications on file prior to visit.     BP 124/76   Pulse 75   Temp 98.3 F (36.8 C) (Oral)   Ht 5' 0.25" (1.53 m)   Wt 134 lb 1.9 oz (60.8 kg)   SpO2 98%   BMI 25.98 kg/m      Objective:   Physical Exam  Constitutional: He appears well-nourished.  HENT:  Right Ear: Tympanic membrane and ear canal normal.  Left Ear: Tympanic membrane and ear canal normal.  Nose: No mucosal edema. Right sinus exhibits no maxillary sinus tenderness and no frontal sinus tenderness. Left sinus exhibits no maxillary sinus tenderness and no frontal sinus tenderness.  Mouth/Throat: Oropharynx is clear and moist.  Nearly healed ulcer too hard palate of mouth. No drainage or erythema  present.  Eyes: Conjunctivae are normal.  Neck: Neck supple.  Cardiovascular: Normal rate and regular rhythm.   Pulmonary/Chest: Effort normal and breath sounds normal. He has no wheezes. He has no rales.  Skin: Skin is warm and dry.          Assessment & Plan:  Oral ulcer:  Present since hospitalization in late September 2017. Gradual, slow improvement with magic mouthwash but without complete resolved. Weight loss due to inability to eat given pain and lack of taste. Suspect residual fatigue from hospitalization and discuss this may take several weeks for complete improvement. UA today negative, he has seen his urologist. He does not appear acutely ill, labs repeated and were stable 1 week ago during his hospital follow up. Perception for triamcinolone paste and viscous lidocaine solution sent to pharmacy for treatment of oral ulcer. Discussed to give this 1-2 weeks and notify if no improvement.  Sheral Flow, NP

## 2015-10-28 NOTE — Progress Notes (Signed)
Pre visit review using our clinic review tool, if applicable. No additional management support is needed unless otherwise documented below in the visit note. 

## 2015-10-28 NOTE — Patient Instructions (Signed)
Apply viscous lidocaine to a q-tip and rub it on your sore every 4 hours as needed for pain.  Apply the Triamcinolone Paste on a q-tip to the roof of your mouth twice daily for the next 1-2 weeks.  Please follow up with Dr. Deborra Medina if no improvement in 1 week.  It was a pleasure to see you today!

## 2015-11-11 ENCOUNTER — Encounter: Payer: Medicare Other | Attending: Family Medicine | Admitting: Dietician

## 2015-11-11 ENCOUNTER — Encounter: Payer: Self-pay | Admitting: Dietician

## 2015-11-11 VITALS — Ht 60.0 in | Wt 134.0 lb

## 2015-11-11 DIAGNOSIS — R627 Adult failure to thrive: Secondary | ICD-10-CM | POA: Diagnosis not present

## 2015-11-11 NOTE — Patient Instructions (Addendum)
   Eat something every 3-4 hours during the day. Try graham crackers with peanut butter, fruit with a handful of nuts or a piece of cheese for snacks.   Keep drinking Ensure for 1 snack daily, up to 2 daily.   Sip on drinks between meals, try to drink 4oz every 30 minutes. Propel water, healthy Balance juice, or Nestle Splash are good choices.  Start some light walking for a few minutes daily.

## 2015-11-11 NOTE — Progress Notes (Signed)
Medical Nutrition Therapy: Visit start time: 1130  end time: 1245 Assessment:  Diagnosis: Failure to Thrive in adult Past medical history: HTN, HLD, CHF, bile duct obstruction (episodes of nausea and vomiting), pancreatitis, DDD Psychosocial issues/ stress concerns: patient reports high level of stress, does not feel he is dealing well with stress at this time. Preferred learning method:  . Auditory . Visual  Current weight: 134lbs  Height: 5'0" patient states he was 5'3" prior to DDD Medications, supplements: reviewed list in chart with patient and daughter  Progress and evaluation: Patient reports diagnosis of stone blocking bile duct about 1 month ago, during which time he was having episodes of severe abdominal pain with nausea and vomiting. He also was experiencing UTI and URI at that time. Due to the nausea and pain, his appetite was poor and he lost about 20lbs. His appetite has been slow to return, but he does feel better. He lives alone (wife passed away from pancreatic cancer) and does not have energy or desire to cook meals. He does eat some meals out. His daughter is supportive and lives close by, brings meals to patient about 3 times a week, but he does not always eat her meals.    Physical activity: none  Dietary Intake:  Usual eating pattern includes 2 meals and 1-2 snacks per day. Dining out frequency: 3-4 meals per week.  Breakfast: sausage biscuit, tea at [employer]; nutrigrain bar and juice or banana Snack: none Lunch/ Supper: 3-4pm--peanut butter and jelly sandwich and milk 2x a week; grilled cheese with 2 strips bacon. Daughter brings meal about 3 times a week.  Snack: 5-6 cookies and milk Snack: Ensure Beverages: peach flavor iced tea, sometimes diet. Doesn't like water or most flavored waters  Nutrition Care Education: Topics covered: weight control/ weight gain Basic nutrition: basic food groups, appropriate nutrient balance, appropriate meal and snack schedule     Weight control: Discussed options for high-calorie foods, allowing "favorite" foods, eating small amounts often during the day and using reminder cues. Discussed ways to stimulate appetite, including regular light exercise. Discussed importance of protein and balanced nutrition to maintain strength and increase energy.   Nutritional Diagnosis:  [place]-3.2 Unintentional weight loss As related to history of poor appetite.  As evidenced by patient report.  Intervention: Instruction as noted above.   Set goals with patient input.   No follow-up scheduled at this time; will call patient in 2-4 weeks to check on progress.   Education Materials given:  Marland Kitchen Dealing with Loss of Appetite . Sample meal pattern/ menus: Quick and Healthy Meal Ideas . Snacking handout . Goals/ instructions  Learner/ who was taught:  . Patient  . Family member: daughter Lemmie Evens  Level of understanding: Marland Kitchen Verbalizes/ demonstrates competency  Demonstrated degree of understanding via:   Teach back Learning barriers: . None  Willingness to learn/ readiness for change: . Eager, change in progress  Monitoring and Evaluation:  Dietary intake, exercise, and body weight      follow up: prn

## 2015-11-12 DIAGNOSIS — N401 Enlarged prostate with lower urinary tract symptoms: Secondary | ICD-10-CM | POA: Diagnosis not present

## 2015-11-12 DIAGNOSIS — R350 Frequency of micturition: Secondary | ICD-10-CM | POA: Diagnosis not present

## 2015-11-19 ENCOUNTER — Ambulatory Visit: Payer: Medicare Other

## 2015-11-20 ENCOUNTER — Ambulatory Visit (INDEPENDENT_AMBULATORY_CARE_PROVIDER_SITE_OTHER): Payer: Medicare Other

## 2015-11-20 DIAGNOSIS — E538 Deficiency of other specified B group vitamins: Secondary | ICD-10-CM | POA: Diagnosis not present

## 2015-11-20 MED ORDER — CYANOCOBALAMIN 1000 MCG/ML IJ SOLN
1000.0000 ug | Freq: Once | INTRAMUSCULAR | Status: AC
  Administered 2015-11-20: 1000 ug via INTRAMUSCULAR

## 2015-11-20 NOTE — Telephone Encounter (Signed)
That's great news.  Thanks for the update. 

## 2015-11-20 NOTE — Telephone Encounter (Signed)
Pt came in for nurse visit for B12 injection; pts weight today is 136.4 lbs. Pt is seeing nutritionist. Pt wanted Dr Deborra Medina to be aware of wt increase.FYI to Dr Deborra Medina.

## 2015-11-26 DIAGNOSIS — N401 Enlarged prostate with lower urinary tract symptoms: Secondary | ICD-10-CM | POA: Diagnosis not present

## 2015-11-28 ENCOUNTER — Telehealth: Payer: Self-pay | Admitting: Family Medicine

## 2015-11-28 NOTE — Telephone Encounter (Signed)
Ok to give verbal clearance.  Pt had an EKG in 09/2015.

## 2015-11-28 NOTE — Telephone Encounter (Signed)
Pam from [employer] called requesting surgical clearance on pt for prostate surgery for BPH. Would you like to see pt for clearance?   If not, a letter or verbal ok will suffice per Pam.  Surgery is outpatient and not scheduled yet pending Dr. Deborra Medina response  Please call Pam back or fax  P: [phone] F: [phone]

## 2015-12-01 ENCOUNTER — Other Ambulatory Visit: Payer: Self-pay | Admitting: Urology

## 2015-12-01 NOTE — Telephone Encounter (Signed)
Lm on Pam's vm and provided verbal order

## 2015-12-01 NOTE — Telephone Encounter (Signed)
Spoke to pt who states they are needing a hard copy of the order faxed to them.

## 2015-12-01 NOTE — Telephone Encounter (Signed)
Letter faxed to urology as requested

## 2015-12-01 NOTE — Telephone Encounter (Signed)
Letter printed on my desk.

## 2015-12-16 ENCOUNTER — Encounter (HOSPITAL_COMMUNITY): Payer: Self-pay | Admitting: *Deleted

## 2015-12-16 ENCOUNTER — Emergency Department (HOSPITAL_COMMUNITY): Payer: Medicare Other

## 2015-12-16 ENCOUNTER — Inpatient Hospital Stay (HOSPITAL_COMMUNITY)
Admission: EM | Admit: 2015-12-16 | Discharge: 2015-12-19 | DRG: 694 | Disposition: A | Discharge status: Home / Self Care | Payer: Medicare Other | Attending: Urology | Admitting: Urology

## 2015-12-16 ENCOUNTER — Ambulatory Visit: Payer: Medicare Other | Admitting: Primary Care

## 2015-12-16 DIAGNOSIS — N12 Tubulo-interstitial nephritis, not specified as acute or chronic: Secondary | ICD-10-CM

## 2015-12-16 DIAGNOSIS — Z96651 Presence of right artificial knee joint: Secondary | ICD-10-CM | POA: Diagnosis present

## 2015-12-16 DIAGNOSIS — R109 Unspecified abdominal pain: Secondary | ICD-10-CM | POA: Diagnosis not present

## 2015-12-16 DIAGNOSIS — N200 Calculus of kidney: Secondary | ICD-10-CM | POA: Diagnosis not present

## 2015-12-16 DIAGNOSIS — N135 Crossing vessel and stricture of ureter without hydronephrosis: Secondary | ICD-10-CM | POA: Diagnosis not present

## 2015-12-16 DIAGNOSIS — D510 Vitamin B12 deficiency anemia due to intrinsic factor deficiency: Secondary | ICD-10-CM | POA: Diagnosis not present

## 2015-12-16 DIAGNOSIS — I110 Hypertensive heart disease with heart failure: Secondary | ICD-10-CM | POA: Diagnosis not present

## 2015-12-16 DIAGNOSIS — Z880 Allergy status to penicillin: Secondary | ICD-10-CM

## 2015-12-16 DIAGNOSIS — Z8249 Family history of ischemic heart disease and other diseases of the circulatory system: Secondary | ICD-10-CM | POA: Diagnosis not present

## 2015-12-16 DIAGNOSIS — Z87891 Personal history of nicotine dependence: Secondary | ICD-10-CM

## 2015-12-16 DIAGNOSIS — N390 Urinary tract infection, site not specified: Secondary | ICD-10-CM | POA: Diagnosis not present

## 2015-12-16 DIAGNOSIS — G4730 Sleep apnea, unspecified: Secondary | ICD-10-CM | POA: Diagnosis not present

## 2015-12-16 DIAGNOSIS — Z800 Family history of malignant neoplasm of digestive organs: Secondary | ICD-10-CM | POA: Diagnosis not present

## 2015-12-16 DIAGNOSIS — K5730 Diverticulosis of large intestine without perforation or abscess without bleeding: Secondary | ICD-10-CM | POA: Diagnosis present

## 2015-12-16 DIAGNOSIS — Z9049 Acquired absence of other specified parts of digestive tract: Secondary | ICD-10-CM

## 2015-12-16 DIAGNOSIS — N400 Enlarged prostate without lower urinary tract symptoms: Secondary | ICD-10-CM | POA: Diagnosis not present

## 2015-12-16 DIAGNOSIS — I509 Heart failure, unspecified: Secondary | ICD-10-CM | POA: Diagnosis not present

## 2015-12-16 DIAGNOSIS — E785 Hyperlipidemia, unspecified: Secondary | ICD-10-CM | POA: Diagnosis present

## 2015-12-16 DIAGNOSIS — Z96641 Presence of right artificial hip joint: Secondary | ICD-10-CM | POA: Diagnosis present

## 2015-12-16 DIAGNOSIS — R5383 Other fatigue: Secondary | ICD-10-CM | POA: Diagnosis not present

## 2015-12-16 DIAGNOSIS — K1370 Unspecified lesions of oral mucosa: Secondary | ICD-10-CM | POA: Diagnosis not present

## 2015-12-16 DIAGNOSIS — R1319 Other dysphagia: Secondary | ICD-10-CM | POA: Diagnosis not present

## 2015-12-16 DIAGNOSIS — N202 Calculus of kidney with calculus of ureter: Secondary | ICD-10-CM | POA: Diagnosis not present

## 2015-12-16 DIAGNOSIS — I2510 Atherosclerotic heart disease of native coronary artery without angina pectoris: Secondary | ICD-10-CM | POA: Diagnosis not present

## 2015-12-16 DIAGNOSIS — N201 Calculus of ureter: Secondary | ICD-10-CM | POA: Diagnosis present

## 2015-12-16 DIAGNOSIS — Z882 Allergy status to sulfonamides status: Secondary | ICD-10-CM | POA: Diagnosis not present

## 2015-12-16 DIAGNOSIS — N1330 Unspecified hydronephrosis: Secondary | ICD-10-CM | POA: Diagnosis present

## 2015-12-16 DIAGNOSIS — Z7951 Long term (current) use of inhaled steroids: Secondary | ICD-10-CM

## 2015-12-16 DIAGNOSIS — Z86010 Personal history of colonic polyps: Secondary | ICD-10-CM | POA: Diagnosis not present

## 2015-12-16 DIAGNOSIS — Z79899 Other long term (current) drug therapy: Secondary | ICD-10-CM | POA: Diagnosis not present

## 2015-12-16 LAB — COMPREHENSIVE METABOLIC PANEL
ALBUMIN: 4.5 g/dL (ref 3.5–5.0)
ALK PHOS: 110 U/L (ref 38–126)
ALT: 13 U/L — AB (ref 17–63)
AST: 20 U/L (ref 15–41)
Anion gap: 7 (ref 5–15)
BUN: 13 mg/dL (ref 6–20)
CALCIUM: 9.4 mg/dL (ref 8.9–10.3)
CHLORIDE: 109 mmol/L (ref 101–111)
CO2: 25 mmol/L (ref 22–32)
CREATININE: 1.03 mg/dL (ref 0.61–1.24)
GFR calc Af Amer: >60 mL/min (ref >60)
GFR calc non Af Amer: >60 mL/min (ref >60)
GLUCOSE: 140 mg/dL — AB (ref 65–99)
Potassium: 4.1 mmol/L (ref 3.5–5.1)
SODIUM: 141 mmol/L (ref 135–145)
Total Bilirubin: 1.2 mg/dL (ref 0.3–1.2)
Total Protein: 6.5 g/dL (ref 6.5–8.1)

## 2015-12-16 LAB — URINALYSIS, ROUTINE W REFLEX MICROSCOPIC
BILIRUBIN URINE: NEGATIVE
GLUCOSE, UA: NEGATIVE mg/dL
Ketones, ur: NEGATIVE mg/dL
Nitrite: POSITIVE — AB
PROTEIN: NEGATIVE mg/dL
SPECIFIC GRAVITY, URINE: 1.018 (ref 1.005–1.030)
pH: 6 (ref 5.0–8.0)

## 2015-12-16 LAB — CBC
HCT: 47.1 % (ref 39.0–52.0)
Hemoglobin: 16.3 g/dL (ref 13.0–17.0)
MCH: 28.4 pg (ref 26.0–34.0)
MCHC: 34.6 g/dL (ref 30.0–36.0)
MCV: 82.1 fL (ref 78.0–100.0)
PLATELETS: 82 10*3/uL — AB (ref 150–400)
RBC: 5.74 MIL/uL (ref 4.22–5.81)
RDW: 15.4 % (ref 11.5–15.5)
WBC: 10.6 10*3/uL — ABNORMAL HIGH (ref 4.0–10.5)

## 2015-12-16 LAB — I-STAT CG4 LACTIC ACID, ED
LACTIC ACID, VENOUS: 1.21 mmol/L (ref 0.5–1.9)
Lactic Acid, Venous: 0.88 mmol/L (ref 0.5–1.9)

## 2015-12-16 LAB — URINE MICROSCOPIC-ADD ON

## 2015-12-16 LAB — LIPASE, BLOOD: LIPASE: 21 U/L (ref 11–51)

## 2015-12-16 MED ORDER — FENTANYL CITRATE (PF) 100 MCG/2ML IJ SOLN
12.5000 ug | Freq: Once | INTRAMUSCULAR | Status: AC
  Administered 2015-12-16: 12.5 ug via INTRAVENOUS
  Filled 2015-12-16: qty 2

## 2015-12-16 MED ORDER — IOPAMIDOL (ISOVUE-300) INJECTION 61%
100.0000 mL | Freq: Once | INTRAVENOUS | Status: AC | PRN
  Administered 2015-12-16: 100 mL via INTRAVENOUS

## 2015-12-16 MED ORDER — CIPROFLOXACIN IN D5W 400 MG/200ML IV SOLN: 400.0000 mg | Freq: Once | INTRAVENOUS | Status: DC

## 2015-12-16 MED ORDER — CLINDAMYCIN PHOSPHATE 900 MG/50ML IV SOLN
900.0000 mg | Freq: Three times a day (TID) | INTRAVENOUS | Status: DC
  Administered 2015-12-17: 900 mg via INTRAVENOUS
  Filled 2015-12-16 (×2): qty 50

## 2015-12-16 MED ORDER — GENTAMICIN SULFATE 40 MG/ML IJ SOLN
120.0000 mg | INTRAMUSCULAR | Status: AC
  Administered 2015-12-16: 120 mg via INTRAVENOUS
  Filled 2015-12-16: qty 3

## 2015-12-16 MED ORDER — IOPAMIDOL (ISOVUE-300) INJECTION 61%
INTRAVENOUS | Status: AC
  Filled 2015-12-16: qty 100

## 2015-12-16 MED ORDER — SODIUM CHLORIDE 0.9 % IJ SOLN
INTRAMUSCULAR | Status: AC
  Filled 2015-12-16: qty 50

## 2015-12-16 MED ORDER — ONDANSETRON HCL 4 MG/2ML IJ SOLN
4.0000 mg | Freq: Once | INTRAMUSCULAR | Status: AC
  Administered 2015-12-16: 4 mg via INTRAVENOUS
  Filled 2015-12-16: qty 2

## 2015-12-16 MED ORDER — SODIUM CHLORIDE 0.9 % IV BOLUS (SEPSIS)
1000.0000 mL | Freq: Once | INTRAVENOUS | Status: AC
  Administered 2015-12-16: 1000 mL via INTRAVENOUS

## 2015-12-16 MED ORDER — CLINDAMYCIN PHOSPHATE 900 MG/50ML IV SOLN
900.0000 mg | INTRAVENOUS | Status: AC
Start: 2015-12-16 — End: 2015-12-16
  Administered 2015-12-16: 900 mg via INTRAVENOUS
  Filled 2015-12-16: qty 50

## 2015-12-16 MED ORDER — GENTAMICIN IN SALINE 1.6-0.9 MG/ML-% IV SOLN
80.0000 mg | Freq: Two times a day (BID) | INTRAVENOUS | Status: DC
  Administered 2015-12-17: 80 mg via INTRAVENOUS
  Filled 2015-12-16: qty 50

## 2015-12-16 MED ORDER — ONDANSETRON HCL 4 MG/2ML IJ SOLN
4.0000 mg | Freq: Once | INTRAMUSCULAR | Status: AC
Start: 2015-12-16 — End: 2015-12-16
  Administered 2015-12-16: 4 mg via INTRAVENOUS
  Filled 2015-12-16: qty 2

## 2015-12-16 MED ORDER — DEXTROSE-NACL 5-0.45 % IV SOLN
INTRAVENOUS | Status: DC
  Administered 2015-12-16: via INTRAVENOUS

## 2015-12-16 NOTE — Progress Notes (Signed)
Pharmacy Antibiotic Note  [name] is a 82 y.o. male admitted on 12/16/2015 with possible hydronephrosis, cholangisitis, liver infection..  Patient presented with c/o abdominal and back pain, bilious vomiting.  PMH significant for pancreatitis, choledocholithiasis, recent URI.  Pharmacy has been consulted for Clindamycin and Gentamicin dosing.  CT scan concerning for hydronephrosis or obstructing stone; possible cholangitis and liver infection.  Plan:  Gentamicin 120mg  IV x 1 loading dose followed by 80mg  IV q12h  Peak goal: 6-8 mcg/ml  Trough goal: < 2 mcg/ml  Clindamycin 900mg  IV q8h  F/U cultures  Watch renal function  Order gentamicin peak and trough levels around 4th dose   Weight: 134 lb 0.6 oz (60.8 kg)  Temp (24hrs), Avg:98 F (36.7 C), Min:98 F (36.7 C), Max:98 F (36.7 C)   Recent Labs Lab 12/16/15 1500 12/16/15 1624 12/16/15 1856  WBC 10.6*  --   --   CREATININE 1.03  --   --   LATICACIDVEN  --  1.21 0.88    Estimated Creatinine Clearance: 43.9 mL/min (by C-G formula based on SCr of 1.03 mg/dL).    Allergies  Allergen Reactions  . Penicillins Other (See Comments)    Whelps, passed out Tolerates cephalosporins  Has patient had a PCN reaction causing immediate rash, facial/tongue/throat swelling, SOB or lightheadedness with hypotension:  yes Has patient had a PCN reaction causing severe rash involving mucus membranes or skin necrosis: no Has patient had a PCN reaction that required hospitalization: no Has patient had a PCN reaction occurring within the last 10 years: no If all of the above answers are "NO", then may proceed with Cephalosporin use.   . Sulfamethoxazole-Trimethoprim Rash  . Sulfonamide Derivatives Rash    Antimicrobials this admission: 11/28 gent >>   11/28 clinda >>    Dose adjustments this admission:    Microbiology results: 11/28 BCx: sent 11/28 UCx: sent   Thank you for allowing pharmacy to be a part of this  patient's care.  Everette Rank, PharmD 12/16/2015 7:07 PM

## 2015-12-16 NOTE — Anesthesia Preprocedure Evaluation (Addendum)
Anesthesia Evaluation  Patient identified by MRN, date of birth, ID band Patient awake    Reviewed: Allergy & Precautions, NPO status , Patient's Chart, lab work & pertinent test results  Airway Mallampati: II  TM Distance: >3 FB Neck ROM: Full    Dental no notable dental hx. (+)    Pulmonary sleep apnea , former smoker,    breath sounds clear to auscultation (-) wheezing      Cardiovascular hypertension, Pt. on medications and Pt. on home beta blockers + CAD and +CHF  Normal cardiovascular exam Rhythm:Regular Rate:Normal     Neuro/Psych  Headaches, PSYCHIATRIC DISORDERS    GI/Hepatic negative GI ROS, Neg liver ROS,   Endo/Other  negative endocrine ROS  Renal/GU Renal disease  negative genitourinary   Musculoskeletal  (+) Arthritis ,   Abdominal   Peds negative pediatric ROS (+)  Hematology  (+) anemia ,   Anesthesia Other Findings GA without difficulty on 9/17(ETT) for ERCP Thrombocytopenia (84k) CAD Hypertension Neurogenic dysphagia   CHF    Unspecified sleep apnea  does not have cpap-was not able to tolerate    Abnormal LFTs.... Nl now            Reproductive/Obstetrics negative OB ROS                            Anesthesia Physical  Anesthesia Plan  ASA: II  Anesthesia Plan: General   Post-op Pain Management:    Induction: Intravenous  Airway Management Planned: Oral ETT  Additional Equipment:   Intra-op Plan:   Post-operative Plan: Extubation in OR  Informed Consent: I have reviewed the patients History and Physical, chart, labs and discussed the procedure including the risks, benefits and alternatives for the proposed anesthesia with the patient or authorized representative who has indicated his/her understanding and acceptance.   Dental Advisory Given and Dental advisory given  Plan Discussed with: CRNA and Surgeon  Anesthesia Plan Comments: (  Discussed  general anesthesia, including possible nausea, instrumentation of airway, sore throat,pulmonary aspiration, etc. I asked if the were any outstanding questions, or  concerns before we proceeded. )        Anesthesia Quick Evaluation

## 2015-12-16 NOTE — Consult Note (Signed)
New Consult Note  Requesting Physician: Courtney Paris, MD Service Requesting Consult: Emergency med   Urology Consult Attending: Louis Meckel Reason for Consult:  Ureteral stone with UTI  Subjective: [name] is seen in consultation for reasons noted above at the request of Courtney Paris, MD.  This is a 82 yo patient with a history of multiple medical problems, notably CAD, CHF, HTN, HLD, diveritulosis, choledocholithiasis and pancreatitis, BPH for which he is followed by Dr. Gaynelle Arabian with [facility]. He is actually scheduled for laser TURP with him on Monday. He presents with 1 day of left flank and abdominal pain. He has not had any fevers but has had chills. He has vomited multiple times and has not been hydrating. He denies gross hematuria. He does report dysuria that has been going on for last 2 weeks. He has recurrent UTI likely secondary to BPH. He has never had a stone before. Strong family history for stones.    Past Medical History: Past Medical History:  Diagnosis Date  . Abnormal LFTs   . B12 deficiency   . CHF (congestive heart failure) ([place])   . Choledocholithiasis with obstruction 07/02/2013  . Cluster headache   . Coronary atherosclerosis of unspecified type of vessel, native or graft   . Degeneration of intervertebral disc, site unspecified   . Diverticulosis of colon (without mention of hemorrhage)   . Hyperlipidemia   . Hypertension   . Hypertrophy of prostate without urinary obstruction and other lower urinary tract symptoms (LUTS)   . Monocytosis 01/01/2014  . Nephrolithiasis   . Neurogenic dysphagia   . Osteoarthritis   . Pernicious anemia   . Personal history of colonic polyps    adenomatous  . Prostate enlargement    urinary frequency, nocturia  . Thrombocytopenia, unspecified 09/01/2012  . Unspecified sleep apnea    does not have cpap-was not able to tolerate    Past Surgical History:  Past Surgical History:  Procedure  Laterality Date  . BIOPSY PROSTATE  2008   Benign  . BONE MARROW BIOPSY  2011  . CHOLECYSTECTOMY  11/24/2010   Procedure: LAPAROSCOPIC CHOLECYSTECTOMY WITH INTRAOPERATIVE CHOLANGIOGRAM;  Surgeon: Earnstine Regal, MD;  Location: [facility];  Service: General;  Laterality: N/A;  c-arm  . ERCP N/A 07/04/2013   Procedure: ENDOSCOPIC RETROGRADE CHOLANGIOPANCREATOGRAPHY (ERCP);  Surgeon: Gatha Mayer, MD;  Location: [facility];  Service: Endoscopy;  Laterality: N/A;  MAC if available  . ERCP N/A 10/12/2015   Procedure: ENDOSCOPIC RETROGRADE CHOLANGIOPANCREATOGRAPHY (ERCP);  Surgeon: [name], MD;  Location: [facility];  Service: Endoscopy;  Laterality: N/A;  . HERNIA REPAIR  2004   [place]  . inguinal herniorrhapy  2003  . KNEE ARTHROSCOPY     5 times  . NASAL ENDOSCOPY  2012  . ROTATOR CUFF REPAIR Right 2006  . TONSILLECTOMY    . TOTAL HIP ARTHROPLASTY Right 2011  . TOTAL KNEE ARTHROPLASTY  2003  . UVULECTOMY  1993    Medication: Current Facility-Administered Medications  Medication Dose Route Frequency Provider Last Rate Last Dose  . clindamycin (CLEOCIN) IVPB 900 mg  900 mg Intravenous STAT Leann T Poindexter, RPH      . gentamicin (GARAMYCIN) 120 mg in dextrose 5 % 50 mL IVPB  120 mg Intravenous STAT Leann T Poindexter, RPH      . iopamidol (ISOVUE-300) 61 % injection           . ondansetron (ZOFRAN) injection 4 mg  4 mg Intravenous Once  Gwenyth Allegra Tegeler, MD      . sodium chloride 0.9 % bolus 1,000 mL  1,000 mL Intravenous Once Gwenyth Allegra Tegeler, MD      . sodium chloride 0.9 % injection            Current Outpatient Prescriptions  Medication Sig Dispense Refill  . aspirin EC 81 MG tablet Take 81 mg by mouth daily.    . calcium carbonate (TUMS - DOSED IN MG ELEMENTAL CALCIUM) 500 MG chewable tablet Chew 2 tablets by mouth daily as needed for indigestion.     Marland Kitchen co-enzyme Q-10 30 MG capsule Take 200 mg by mouth daily.    . cyanocobalamin (,VITAMIN B-12,) 1000 MCG/ML injection  Inject 1 mL (1,000 mcg total) into the muscle every 30 (thirty) days. 1 mL 0  . dextromethorphan-guaiFENesin (MUCINEX DM) 30-600 MG 12hr tablet Take 1 tablet by mouth 2 (two) times daily as needed for cough.    . metoprolol succinate (TOPROL-XL) 25 MG 24 hr tablet Take 1 tablet (25 mg total) by mouth daily. 30 tablet 6  . mometasone (NASONEX) 50 MCG/ACT nasal spray Place 2 sprays into the nose daily as needed (congestion).     . Multiple Vitamins-Minerals (MULTIVITAMIN ADULTS PO) Take 1 tablet by mouth daily.    . nitroGLYCERIN (NITROSTAT) 0.4 MG SL tablet Place 1 tablet (0.4 mg total) under the tongue every 5 (five) minutes as needed. Up to 3 doses 25 tablet 11  . Omega-3 Fatty Acids (FISH OIL) 1200 MG CAPS Take 1,200 mg by mouth daily.    . Tamsulosin HCl (FLOMAX) 0.4 MG CAPS Take 0.4 mg by mouth daily after supper.    . lidocaine (XYLOCAINE) 2 % solution Apply to the oral sore with a q-tip every 4 hours as needed for pain. (Patient not taking: Reported on 12/16/2015) 50 mL 0  . magic mouthwash SOLN Take 5 mLs by mouth 3 (three) times daily as needed for mouth pain. (Patient not taking: Reported on 12/16/2015) 30 mL 0  . oxyCODONE (OXY IR/ROXICODONE) 5 MG immediate release tablet Take 1 tablet (5 mg total) by mouth every 4 (four) hours as needed for moderate pain. (Patient not taking: Reported on 12/16/2015) 5 tablet 0  . triamcinolone (KENALOG) 0.1 % paste Apply to the sore in your mouth twice daily for 1-2 weeks. (Patient not taking: Reported on 12/16/2015) 5 g 0    Allergies: Allergies  Allergen Reactions  . Penicillins Other (See Comments)    [place], passed out Tolerates cephalosporins  Has patient had a PCN reaction causing immediate rash, facial/tongue/throat swelling, SOB or lightheadedness with hypotension:  yes Has patient had a PCN reaction causing severe rash involving mucus membranes or skin necrosis: no Has patient had a PCN reaction that required hospitalization: no Has  patient had a PCN reaction occurring within the last 10 years: no If all of the above answers are "NO", then may proceed with Cephalosporin use.   . Sulfamethoxazole-Trimethoprim Rash  . Sulfonamide Derivatives Rash    Social History: Social History  Substance Use Topics  . Smoking status: Former Smoker    Types: Pipe    Quit date: 01/18/1966  . Smokeless tobacco: Never Used  . Alcohol use No    Family History Family History  Problem Relation Age of Onset  . Colon cancer Father 49  . Peripheral vascular disease Mother     Review of Systems 10 systems were reviewed and are negative except as noted specifically in the HPI.  Objective: Vital signs in last 24 hours: BP 157/70   Pulse 95   Temp 98 F (36.7 C) (Oral)   Resp 16   Wt 60.8 kg (134 lb 0.6 oz)   SpO2 96%   BMI 26.18 kg/m   Intake/Output last 3 shifts: No intake/output data recorded.  Physical Exam General: NAD, A&O, resting, appropriate HEENT: EOMI, MMM Pulmonary: Normal work of breathing on RA Cardiovascular: Regular rate, adequate peripheral perfusion Abdomen: soft, left abdomen tender to deep palpation GU: Left CVA tenderness Extremities: warm and well perfused Neuro: Appropriate, no focal neurological deficits  Most Recent Labs: Lab Results  Component Value Date   WBC 10.6 (H) 12/16/2015   HGB 16.3 12/16/2015   HCT 47.1 12/16/2015   PLT 82 (L) 12/16/2015    Lab Results  Component Value Date   NA 141 12/16/2015   K 4.1 12/16/2015   CL 109 12/16/2015   CO2 25 12/16/2015   BUN 13 12/16/2015   CREATININE 1.03 12/16/2015   CALCIUM 9.4 12/16/2015    Lab Results  Component Value Date   ALKPHOS 110 12/16/2015   BILITOT 1.2 12/16/2015   BILIDIR 4.1 (H) 10/11/2015   PROT 6.5 12/16/2015   ALBUMIN 4.5 12/16/2015   ALT 13 (L) 12/16/2015   AST 20 12/16/2015    Lab Results  Component Value Date   INR 1.18 10/11/2015   APTT 28 10/11/2015     Urine Culture: Pending.  Prior culture  10/10/15 with E Coli resistant to Amp and Cipro. Sensitive to Bactrim, Macrobid, Gent, Imipenem, Rocephin, Cefazolin, Augmentin   IMAGING: Dg Chest 2 View  Result Date: 12/16/2015 CLINICAL DATA:  82 year old male with fatigue. Initial encounter. EXAM: CHEST  2 VIEW COMPARISON:  10/10/2015 and earlier. FINDINGS: Semi upright AP and lateral views of the chest. Stable lung volumes. No pneumothorax, pulmonary edema, pleural effusion or confluent pulmonary opacity. Stable mild cardiomegaly. Other mediastinal contours are within normal limits. Visualized tracheal air column is within normal limits. No acute osseous abnormality identified. Stable cholecystectomy clips. IMPRESSION: No acute cardiopulmonary abnormality. Electronically Signed   By: Genevie Ann M.D.   On: 12/16/2015 17:13   Ct Abdomen Pelvis W Contrast  Result Date: 12/16/2015 CLINICAL DATA:  Severe abdominal pain EXAM: CT ABDOMEN AND PELVIS WITH CONTRAST TECHNIQUE: Multidetector CT imaging of the abdomen and pelvis was performed using the standard protocol following bolus administration of intravenous contrast. CONTRAST:  162m ISOVUE-300 IOPAMIDOL (ISOVUE-300) INJECTION 61% COMPARISON:  CT abdomen pelvis 10/10/2015 FINDINGS: Lower chest: Lung bases clear. Hepatobiliary: Multiple wedge-shaped hypodensities are seen in the peripheral liver just under the diaphragm. Remainder of the liver is normal. Cholecystectomy clips. Common bile duct 6 mm. Pneumobilia related to sphincterotomy 10/12/2015. No stones were identified at that time. Portal vein patent. Pancreas: Negative Spleen: Negative Adrenals/Urinary Tract: Marked left hydronephrosis. 10 mm obstructing stone at the left UPJ. Extensive perinephric edema on the left. No renal stones or obstruction on the right. No renal mass. Urinary bladder normal. Stomach/Bowel: Moderate hiatal hernia. Negative for bowel obstruction. Extensive sigmoid diverticulosis without diverticulitis. Normal appendix.  Vascular/Lymphatic: Atherosclerotic aorta without aneurysm. Negative for lymphadenopathy Reproductive: Marked prostate enlargement with coarse calcifications. Other: No free fluid. No free air. Left inguinal hernia containing fat. Musculoskeletal: Extensive multilevel degenerative change throughout the lumbar spine without fracture or other acute skeletal abnormality. Right hip replacement. IMPRESSION: Multiple wedge-shaped low-density areas in the dome of the liver which were not present on 10/10/2015. There has been interval sphincterotomy. Considerations would include cholangitis  and liver infection. Hepatic infarcts considered less likely. Metastatic disease would be unusual given the shape and location of these lesions. Correlate with liver function tests and clinical symptoms. Follow-up CT with contrast suggested to evaluate for interval changes. There is pneumobilia related to sphincterotomy Hiatal hernia and diverticulosis.  Negative for diverticulitis. Marked left hydronephrosis due to 10 mm obstructing stone in the left UPJ. Electronically Signed   By: Franchot Gallo M.D.   On: 12/16/2015 17:39     Assessment: Patient is a 82 y.o. male with multiple medical problems as outlined above, GU history notable for BPH (followed by Dr. Gaynelle Arabian), here for 1 day of abdominal and left flank pain secondary to 17m left UPJ stone. Urine grossly infected and he has had trouble hydrating with multiple episodes of vomiting.  Suspicion of UTI and likelihood for failure of passage warrant admission and ureteral stent placement.   Recommendations: 1. Admit to urology under Dr. HLouis Meckel 2. NPO for OR tomorrow: cysto, left ureteral stent placement 3. Based on prior cultures and allergies, will give clinda/gent for coverage. 4. Follow-up final urine culture 5. Pain control 6. Gentle IVF given PMHx  Thank you for this consult. Please do not hesitate to contact [facility] any further questions/concerns.  JLorayne Bender MD PGY4 Urology Resident

## 2015-12-16 NOTE — ED Triage Notes (Signed)
Pt complains of lower back pain, lower abdominal pain, and nausea since this morning. Pt denies diarrhea. Pt states he had a normal bowel movement this morning. Pt has not been able to keep fluids down.

## 2015-12-16 NOTE — ED Notes (Signed)
Nurse is in the room collecting labs 

## 2015-12-16 NOTE — ED Provider Notes (Signed)
[facility] DEPT Provider Note   CSN: [id number] Arrival date & time: 12/16/15  1433     History   Chief Complaint Chief Complaint  Patient presents with  . Abdominal Pain  . Back Pain  . Nausea    HPI NAYAN PROCH is a 82 y.o. male with a past medical history significant for pancreatitis, choledocholithiasis, coronary artery disease, CHF, hypertension, hyperlipidemia, diverticulosis, BPH, and recent urinary tract infection who presents with abdominal pain, back pain, nausea, vomiting, fatigue, and malaise. Patient is currently by his daughter who report that this morning, patient woke up feeling "off". He reports that he was feeling fatigue and general malaise. He reports that he began having progressively worsening abdominal pain today. He describes it as severe, and across his abdomen. He describes rating around the side towards his back. He reports nausea and multiple episodes of vomiting. Initially it was dry heaving but then he reports it being bilious. He describes the pain as an 8 out of 10 in severity at times but is now a 4 out of 10. He reports that it is tender to palpation. He is unable to tolerate any oral intake today. He says that he had a normal bowel movement today without any bleeding. He denies any constipation, diarrhea. He denies any urinary symptoms including no change in urine smell, appearance, or dysuria. He reports that he is feeling similar to when he felt when he had his recent pancreatitis, concern for choledocholithiasis, and his UTI. He reports he is scheduled to have a urological procedure in his bladder next Monday. Patient denies fevers, chills, chest pain, shortness of breath, cough. He denies any sick contacts.         The history is provided by the patient, medical records and a relative. No language interpreter was used.  Abdominal Pain   This is a new problem. The current episode started 6 to 12 hours ago. The problem occurs constantly. The  problem has been gradually improving. The pain is associated with eating. The pain is located in the generalized abdominal region. The quality of the pain is aching. The pain is at a severity of 8/10. The pain is severe. Associated symptoms include nausea and vomiting. Pertinent negatives include fever, diarrhea, constipation, dysuria, frequency and headaches. The symptoms are aggravated by palpation and eating. Nothing relieves the symptoms.  Emesis   This is a new problem. The current episode started 6 to 12 hours ago. The problem occurs continuously. The problem has not changed since onset.The emesis has an appearance of stomach contents and bilious material. There has been no fever. Associated symptoms include abdominal pain. Pertinent negatives include no chills, no cough, no diarrhea, no fever and no headaches.    Past Medical History:  Diagnosis Date  . Abnormal LFTs   . B12 deficiency   . CHF (congestive heart failure) ([place])   . Choledocholithiasis with obstruction 07/02/2013  . Cluster headache   . Coronary atherosclerosis of unspecified type of vessel, native or graft   . Degeneration of intervertebral disc, site unspecified   . Diverticulosis of colon (without mention of hemorrhage)   . Hyperlipidemia   . Hypertension   . Hypertrophy of prostate without urinary obstruction and other lower urinary tract symptoms (LUTS)   . Monocytosis 01/01/2014  . Nephrolithiasis   . Neurogenic dysphagia   . Osteoarthritis   . Pernicious anemia   . Personal history of colonic polyps    adenomatous  . Prostate enlargement  urinary frequency, nocturia  . Thrombocytopenia, unspecified 09/01/2012  . Unspecified sleep apnea    does not have cpap-was not able to tolerate    Patient Active Problem List   Diagnosis Date Noted  . Jaundice   . Lower urinary tract infectious disease 10/11/2015  . Nausea with vomiting 10/11/2015  . Abdominal pain 10/11/2015  . Sepsis ([place]) 10/11/2015  .  Pancreatitis, acute 10/11/2015  . Cough 03/25/2015  . Depression 03/12/2015  . Medicare annual wellness visit, subsequent 09/06/2013  . Nephrolithiasis 07/10/2013  . Choledocholithiasis with obstruction 07/02/2013  . Abnormal LFTs 07/02/2013  . Thrombocytopenia ([place]) 09/01/2012  . DNR (do not resuscitate) 06/06/2012  . KNEE REPLACEMENT, RIGHT, HX OF 04/02/2010  . HIP REPLACEMENT, RIGHT, HX OF 06/20/2009  . ANEMIA, PERNICIOUS 01/03/2009  . Coronary atherosclerosis 12/12/2007  . COLONIC POLYPS, HX OF 12/12/2007  . HLD (hyperlipidemia) 06/14/2006  . Essential hypertension 06/14/2006  . DIVERTICULOSIS, COLON 06/14/2006  . Elevated PSA, less than 10 ng/ml 06/14/2006  . DEGENERATIVE DISC DISEASE 06/14/2006  . SLEEP APNEA 06/14/2006  . TONSILLECTOMY AND ADENOIDECTOMY, HX OF 06/14/2006  . TOTAL KNEE REPLACEMENT, HX OF 06/14/2006    Past Surgical History:  Procedure Laterality Date  . BIOPSY PROSTATE  2008   Benign  . BONE MARROW BIOPSY  2011  . CHOLECYSTECTOMY  11/24/2010   Procedure: LAPAROSCOPIC CHOLECYSTECTOMY WITH INTRAOPERATIVE CHOLANGIOGRAM;  Surgeon: Earnstine Regal, MD;  Location: [facility];  Service: General;  Laterality: N/A;  c-arm  . ERCP N/A 07/04/2013   Procedure: ENDOSCOPIC RETROGRADE CHOLANGIOPANCREATOGRAPHY (ERCP);  Surgeon: Gatha Mayer, MD;  Location: [facility];  Service: Endoscopy;  Laterality: N/A;  MAC if available  . ERCP N/A 10/12/2015   Procedure: ENDOSCOPIC RETROGRADE CHOLANGIOPANCREATOGRAPHY (ERCP);  Surgeon: [name], MD;  Location: [facility];  Service: Endoscopy;  Laterality: N/A;  . HERNIA REPAIR  2004   [place]  . inguinal herniorrhapy  2003  . KNEE ARTHROSCOPY     5 times  . NASAL ENDOSCOPY  2012  . ROTATOR CUFF REPAIR Right 2006  . TONSILLECTOMY    . TOTAL HIP ARTHROPLASTY Right 2011  . TOTAL KNEE ARTHROPLASTY  2003  . [place] Medications    Prior to Admission medications   Medication Sig Start Date End Date Taking?  Authorizing Provider  calcium carbonate (TUMS - DOSED IN MG ELEMENTAL CALCIUM) 500 MG chewable tablet Chew 2 tablets by mouth daily as needed for indigestion.     Historical Provider, MD  clobetasol cream (TEMOVATE) 1.96 % Apply 1 application topically 2 (two) times daily. For whelps/itching    Historical Provider, MD  co-enzyme Q-10 30 MG capsule Take 200 mg by mouth daily.    Historical Provider, MD  cyanocobalamin (,VITAMIN B-12,) 1000 MCG/ML injection Inject 1 mL (1,000 mcg total) into the muscle every 30 (thirty) days. 03/27/14   Lucille Passy, MD  dextromethorphan-guaiFENesin (MUCINEX DM) 30-600 MG 12hr tablet Take 1 tablet by mouth 2 (two) times daily as needed for cough.    Historical Provider, MD  lidocaine (XYLOCAINE) 2 % solution Apply to the oral sore with a q-tip every 4 hours as needed for pain. Patient not taking: Reported on 11/11/2015 10/28/15   Pleas Koch, NP  magic mouthwash SOLN Take 5 mLs by mouth 3 (three) times daily as needed for mouth pain. Patient not taking: Reported on 11/11/2015 10/14/15   Geradine Girt, DO  metoprolol succinate (TOPROL-XL) 25 MG 24  hr tablet Take 1 tablet (25 mg total) by mouth daily. 12/04/14   Lelon Perla, MD  mometasone (NASONEX) 50 MCG/ACT nasal spray Place 2 sprays into the nose daily as needed (congestion).     Historical Provider, MD  nitroGLYCERIN (NITROSTAT) 0.4 MG SL tablet Place 1 tablet (0.4 mg total) under the tongue every 5 (five) minutes as needed. Up to 3 doses 01/09/13   Burtis Junes, NP  Omega-3 Fatty Acids (FISH OIL) 1200 MG CAPS Take 1,200 mg by mouth daily.    Historical Provider, MD  oxyCODONE (OXY IR/ROXICODONE) 5 MG immediate release tablet Take 1 tablet (5 mg total) by mouth every 4 (four) hours as needed for moderate pain. Patient not taking: Reported on 11/11/2015 10/14/15   Geradine Girt, DO  Tamsulosin HCl (FLOMAX) 0.4 MG CAPS Take 0.4 mg by mouth daily after supper.    Historical Provider, MD  triamcinolone  (KENALOG) 0.1 % paste Apply to the sore in your mouth twice daily for 1-2 weeks. Patient not taking: Reported on 11/11/2015 10/28/15   Pleas Koch, NP    Family History Family History  Problem Relation Age of Onset  . Colon cancer Father 78  . Peripheral vascular disease Mother     Social History Social History  Substance Use Topics  . Smoking status: Former Smoker    Types: Pipe    Quit date: 01/18/1966  . Smokeless tobacco: Never Used  . Alcohol use No     Allergies   Penicillins; Sulfamethoxazole-trimethoprim; and Sulfonamide derivatives   Review of Systems Review of Systems  Constitutional: Positive for appetite change. Negative for chills and fever.  HENT: Negative for congestion and rhinorrhea.   Eyes: Negative for visual disturbance.  Respiratory: Negative for cough, chest tightness, shortness of breath, wheezing and stridor.   Cardiovascular: Negative for chest pain, palpitations and leg swelling.  Gastrointestinal: Positive for abdominal pain, nausea and vomiting. Negative for abdominal distention, constipation and diarrhea.  Genitourinary: Negative for dysuria, flank pain, frequency, penile pain and testicular pain.  Musculoskeletal: Positive for back pain. Negative for neck pain and neck stiffness.  Skin: Negative for rash and wound.  Neurological: Negative for light-headedness, numbness and headaches.  Psychiatric/Behavioral: Negative for agitation and confusion.  All other systems reviewed and are negative.    Physical Exam Updated Vital Signs BP 149/85 (BP Location: Left Arm)   Pulse 100   Temp 98 F (36.7 C) (Oral)   Resp 18   SpO2 95%   Physical Exam  Constitutional: He is oriented to person, place, and time. He appears well-developed and well-nourished.  HENT:  Head: Normocephalic and atraumatic.  Mouth/Throat: Oropharynx is clear and moist. No oropharyngeal exudate.  Eyes: Conjunctivae and EOM are normal. Pupils are equal, round, and  reactive to light. No scleral icterus.  Neck: Neck supple.  Cardiovascular: Normal rate and regular rhythm.   No murmur heard. Pulmonary/Chest: Effort normal and breath sounds normal. No stridor. No respiratory distress. He has no wheezes. He exhibits no tenderness.  Abdominal: Soft. Normal appearance and bowel sounds are normal. He exhibits no distension. There is generalized tenderness. There is no rigidity, no rebound, no guarding and no CVA tenderness.    Musculoskeletal: He exhibits no edema or tenderness.  Neurological: He is alert and oriented to person, place, and time. No cranial nerve deficit or sensory deficit. Coordination normal.  Skin: Skin is warm and dry. Capillary refill takes less than 2 seconds. No erythema. No pallor.  Psychiatric:  He has a normal mood and affect.  Nursing note and vitals reviewed.    ED Treatments / Results  Labs (all labs ordered are listed, but only abnormal results are displayed) Labs Reviewed  COMPREHENSIVE METABOLIC PANEL - Abnormal; Notable for the following:       Result Value   Glucose, Bld 140 (*)    ALT 13 (*)    All other components within normal limits  CBC - Abnormal; Notable for the following:    WBC 10.6 (*)    Platelets 82 (*)    All other components within normal limits  URINALYSIS, ROUTINE W REFLEX MICROSCOPIC (NOT AT [facility]) - Abnormal; Notable for the following:    APPearance CLOUDY (*)    Hgb urine dipstick SMALL (*)    Nitrite POSITIVE (*)    Leukocytes, UA LARGE (*)    All other components within normal limits  URINE MICROSCOPIC-ADD ON - Abnormal; Notable for the following:    Squamous Epithelial / LPF 0-5 (*)    Bacteria, UA FEW (*)    All other components within normal limits  SURGICAL PCR SCREEN  CULTURE, BLOOD (ROUTINE X 2)  CULTURE, BLOOD (ROUTINE X 2)  URINE CULTURE  LIPASE, BLOOD  LACTATE DEHYDROGENASE  BASIC METABOLIC PANEL  CBC  I-STAT CG4 LACTIC ACID, ED  I-STAT CG4 LACTIC ACID, ED    EKG  EKG  Interpretation None       Radiology Dg Chest 2 View  Result Date: 12/16/2015 CLINICAL DATA:  82 year old male with fatigue. Initial encounter. EXAM: CHEST  2 VIEW COMPARISON:  10/10/2015 and earlier. FINDINGS: Semi upright AP and lateral views of the chest. Stable lung volumes. No pneumothorax, pulmonary edema, pleural effusion or confluent pulmonary opacity. Stable mild cardiomegaly. Other mediastinal contours are within normal limits. Visualized tracheal air column is within normal limits. No acute osseous abnormality identified. Stable cholecystectomy clips. IMPRESSION: No acute cardiopulmonary abnormality. Electronically Signed   By: Genevie Ann M.D.   On: 12/16/2015 17:13   Ct Abdomen Pelvis W Contrast  Result Date: 12/16/2015 CLINICAL DATA:  Severe abdominal pain EXAM: CT ABDOMEN AND PELVIS WITH CONTRAST TECHNIQUE: Multidetector CT imaging of the abdomen and pelvis was performed using the standard protocol following bolus administration of intravenous contrast. CONTRAST:  198m ISOVUE-300 IOPAMIDOL (ISOVUE-300) INJECTION 61% COMPARISON:  CT abdomen pelvis 10/10/2015 FINDINGS: Lower chest: Lung bases clear. Hepatobiliary: Multiple wedge-shaped hypodensities are seen in the peripheral liver just under the diaphragm. Remainder of the liver is normal. Cholecystectomy clips. Common bile duct 6 mm. Pneumobilia related to sphincterotomy 10/12/2015. No stones were identified at that time. Portal vein patent. Pancreas: Negative Spleen: Negative Adrenals/Urinary Tract: Marked left hydronephrosis. 10 mm obstructing stone at the left UPJ. Extensive perinephric edema on the left. No renal stones or obstruction on the right. No renal mass. Urinary bladder normal. Stomach/Bowel: Moderate hiatal hernia. Negative for bowel obstruction. Extensive sigmoid diverticulosis without diverticulitis. Normal appendix. Vascular/Lymphatic: Atherosclerotic aorta without aneurysm. Negative for lymphadenopathy Reproductive: Marked  prostate enlargement with coarse calcifications. Other: No free fluid. No free air. Left inguinal hernia containing fat. Musculoskeletal: Extensive multilevel degenerative change throughout the lumbar spine without fracture or other acute skeletal abnormality. Right hip replacement. IMPRESSION: Multiple wedge-shaped low-density areas in the dome of the liver which were not present on 10/10/2015. There has been interval sphincterotomy. Considerations would include cholangitis and liver infection. Hepatic infarcts considered less likely. Metastatic disease would be unusual given the shape and location of these lesions. Correlate with liver function  tests and clinical symptoms. Follow-up CT with contrast suggested to evaluate for interval changes. There is pneumobilia related to sphincterotomy Hiatal hernia and diverticulosis.  Negative for diverticulitis. Marked left hydronephrosis due to 10 mm obstructing stone in the left UPJ. Electronically Signed   By: Franchot Gallo M.D.   On: 12/16/2015 17:39    Procedures Procedures (including critical care time)  Medications Ordered in ED Medications  iopamidol (ISOVUE-300) 61 % injection (not administered)  sodium chloride 0.9 % injection (not administered)  tamsulosin (FLOMAX) capsule 0.4 mg ( Oral MAR Unhold 12/17/15 0835)  ketorolac (TORADOL) 15 MG/ML injection 15 mg ( Intravenous MAR Unhold 12/17/15 0835)  acetaminophen (TYLENOL) tablet 650 mg (650 mg Oral Given 12/17/15 1018)  diphenhydrAMINE (BENADRYL) injection 12.5 mg ( Intravenous MAR Unhold 12/17/15 0835)    Or  diphenhydrAMINE (BENADRYL) 12.5 MG/5ML elixir 12.5 mg ( Oral MAR Unhold 12/17/15 0835)  ondansetron (ZOFRAN) injection 4 mg ( Intravenous MAR Unhold 12/17/15 0835)  oxyCODONE-acetaminophen (PERCOCET/ROXICET) 5-325 MG per tablet 1 tablet (not administered)  metoprolol (LOPRESSOR) 5 MG/5ML injection (not administered)  fentaNYL (SUBLIMAZE) 100 MCG/2ML injection (not administered)    clindamycin (CLEOCIN) IVPB 600 mg (not administered)  metoprolol succinate (TOPROL-XL) 24 hr tablet 25 mg (not administered)  aspirin EC tablet 81 mg (not administered)  0.9 %  sodium chloride infusion (not administered)  gentamicin (GARAMYCIN) 90 mg in dextrose 5 % 50 mL IVPB (not administered)  sodium chloride 0.9 % bolus 1,000 mL (0 mLs Intravenous Stopped 12/16/15 1746)  fentaNYL (SUBLIMAZE) injection 12.5 mcg (12.5 mcg Intravenous Given 12/16/15 1625)  ondansetron (ZOFRAN) injection 4 mg (4 mg Intravenous Given 12/16/15 1625)  iopamidol (ISOVUE-300) 61 % injection 100 mL (100 mLs Intravenous Contrast Given 12/16/15 1636)  fentaNYL (SUBLIMAZE) injection 12.5 mcg (12.5 mcg Intravenous Given 12/16/15 1757)  ondansetron (ZOFRAN) injection 4 mg (4 mg Intravenous Given 12/16/15 1918)  sodium chloride 0.9 % bolus 1,000 mL (0 mLs Intravenous Stopped 12/16/15 2322)  gentamicin (GARAMYCIN) 120 mg in dextrose 5 % 50 mL IVPB (0 mg Intravenous Stopped 12/16/15 2218)  clindamycin (CLEOCIN) IVPB 900 mg (0 mg Intravenous Stopped 12/16/15 2218)  metoprolol (LOPRESSOR) injection 5 mg (2 mg Intravenous Given 12/17/15 0719)     Initial Impression / Assessment and Plan / ED Course  I have reviewed the triage vital signs and the nursing notes.  Pertinent labs & imaging results that were available during my care of the patient were reviewed by me and considered in my medical decision making (see chart for details).  Clinical Course     RANDE ROYLANCE is a 82 y.o. male with a past medical history significant for pancreatitis, choledocholithiasis, coronary artery disease, CHF, hypertension, hyperlipidemia, diverticulosis, BPH, and recent urinary tract infection who presents with abdominal pain, back pain, nausea, vomiting, fatigue, and malaise.   History and exam are seen above.   On exam, patient has abdominal tenderness all over his upper and midabdomen. Patient does not have significant CVA  tenderness. Patient's lungs are clear. Patient has no chest tenderness. Patient has no leg swelling or leg pain. Patient has nonfocal neurologic exam.  Given history of bilious vomiting and severe abdominal pain, patient will have CT scan to look for significant abnormalities in abdomen. Patient will have screening laboratory testing to look for recurrent pancreatitis, evidence recurrent biliary obstruction, or evidence of UTI/pyelonephritis. Patient will be given pain medicine, nausea medicine, and fluids. Lactic acid will be obtained. Cultures obtained.  6:45 PM Urinalysis shows concern for infection.  CT scan shows concern for severe left hydronephrosis behind a 170 or obstructing stone. Given concern for possible infected stone, urology quickly called. Next  Urology requested aministration of clindamycin and gentamicin and they will see the patient to discuss further management.  Also showed concern for abnormalities in the liver concerning for possible cholangitis or infection. They recommended correlation with lab testing. Correlation with lab testing shows improvement in all liver function testing. Pt still has leukocytosis. Lactic acid normal. Suspect primary problem is the UTI and obstructing stone however, patient will likely need trending of liver function and abdominal exam reassessment.  Urology will the patient and likely taken to the OR tomorrow for stent placement for stone.   Patient had no other problems and was admitted in stable condition.   Final Clinical Impressions(s) / ED Diagnoses   Final diagnoses:  Pyelonephritis  Nephrolithiasis     Clinical Impression: 1. Pyelonephritis   2. Nephrolithiasis     Disposition: Admit to Urology    Courtney Paris, MD 12/17/15 1218

## 2015-12-17 ENCOUNTER — Emergency Department (HOSPITAL_COMMUNITY): Payer: Medicare Other | Admitting: Anesthesiology

## 2015-12-17 ENCOUNTER — Encounter (HOSPITAL_COMMUNITY): Payer: Self-pay | Admitting: *Deleted

## 2015-12-17 ENCOUNTER — Encounter (HOSPITAL_COMMUNITY)
Admission: EM | Disposition: A | Discharge status: Home / Self Care | Payer: Self-pay | Source: Home / Self Care | Attending: Urology

## 2015-12-17 DIAGNOSIS — N201 Calculus of ureter: Secondary | ICD-10-CM | POA: Diagnosis present

## 2015-12-17 HISTORY — PX: CYSTOSCOPY W/ URETERAL STENT PLACEMENT: SHX1429

## 2015-12-17 LAB — BASIC METABOLIC PANEL
Anion gap: 8 (ref 5–15)
BUN: 17 mg/dL (ref 6–20)
CHLORIDE: 104 mmol/L (ref 101–111)
CO2: 23 mmol/L (ref 22–32)
CREATININE: 1.79 mg/dL — AB (ref 0.61–1.24)
Calcium: 8.1 mg/dL — ABNORMAL LOW (ref 8.9–10.3)
GFR, EST AFRICAN AMERICAN: 40 mL/min — AB (ref >60)
GFR, EST NON AFRICAN AMERICAN: 34 mL/min — AB (ref >60)
Glucose, Bld: 237 mg/dL — ABNORMAL HIGH (ref 65–99)
Potassium: 3.7 mmol/L (ref 3.5–5.1)
SODIUM: 135 mmol/L (ref 135–145)

## 2015-12-17 LAB — CBC
HCT: 38.9 % — ABNORMAL LOW (ref 39.0–52.0)
Hemoglobin: 13.1 g/dL (ref 13.0–17.0)
MCH: 28.2 pg (ref 26.0–34.0)
MCHC: 33.7 g/dL (ref 30.0–36.0)
MCV: 83.8 fL (ref 78.0–100.0)
PLATELETS: 60 10*3/uL — AB (ref 150–400)
RBC: 4.64 MIL/uL (ref 4.22–5.81)
RDW: 15.9 % — AB (ref 11.5–15.5)
WBC: 28.3 10*3/uL — AB (ref 4.0–10.5)

## 2015-12-17 LAB — LACTATE DEHYDROGENASE: LDH: 137 U/L (ref 98–192)

## 2015-12-17 LAB — SURGICAL PCR SCREEN
MRSA, PCR: NEGATIVE
Staphylococcus aureus: NEGATIVE

## 2015-12-17 SURGERY — CYSTOSCOPY, WITH RETROGRADE PYELOGRAM AND URETERAL STENT INSERTION
Anesthesia: General | Site: Ureter | Laterality: Left

## 2015-12-17 MED ORDER — PROPOFOL 10 MG/ML IV BOLUS
INTRAVENOUS | Status: AC
  Filled 2015-12-17: qty 20

## 2015-12-17 MED ORDER — ACETAMINOPHEN 325 MG PO TABS
650.0000 mg | ORAL_TABLET | ORAL | Status: DC | PRN
  Administered 2015-12-17: 650 mg via ORAL
  Filled 2015-12-17: qty 2

## 2015-12-17 MED ORDER — MORPHINE SULFATE (PF) 2 MG/ML IV SOLN: 1.0000 mg | INTRAVENOUS | Status: DC | PRN

## 2015-12-17 MED ORDER — CLINDAMYCIN PHOSPHATE 600 MG/50ML IV SOLN
600.0000 mg | Freq: Three times a day (TID) | INTRAVENOUS | Status: DC
  Administered 2015-12-17 – 2015-12-18 (×4): 600 mg via INTRAVENOUS
  Filled 2015-12-17 (×4): qty 50

## 2015-12-17 MED ORDER — ONDANSETRON HCL 4 MG/2ML IJ SOLN
4.0000 mg | INTRAMUSCULAR | Status: DC | PRN
  Administered 2015-12-18: 4 mg via INTRAVENOUS
  Filled 2015-12-17: qty 2

## 2015-12-17 MED ORDER — LIDOCAINE HCL (CARDIAC) 20 MG/ML IV SOLN
INTRAVENOUS | Status: DC | PRN
  Administered 2015-12-17: 100 mg via INTRAVENOUS

## 2015-12-17 MED ORDER — TAMSULOSIN HCL 0.4 MG PO CAPS
0.4000 mg | ORAL_CAPSULE | Freq: Every day | ORAL | Status: DC
  Administered 2015-12-17 – 2015-12-18 (×2): 0.4 mg via ORAL
  Filled 2015-12-17 (×2): qty 1

## 2015-12-17 MED ORDER — METOPROLOL TARTRATE 5 MG/5ML IV SOLN
5.0000 mg | Freq: Once | INTRAVENOUS | Status: AC
Start: 2015-12-17 — End: 2015-12-17
  Administered 2015-12-17: 2 mg via INTRAVENOUS

## 2015-12-17 MED ORDER — FENTANYL CITRATE (PF) 100 MCG/2ML IJ SOLN
INTRAMUSCULAR | Status: DC | PRN
  Administered 2015-12-17: 50 ug via INTRAVENOUS

## 2015-12-17 MED ORDER — STERILE WATER FOR IRRIGATION IR SOLN
Status: DC | PRN
  Administered 2015-12-17: 3000 mL

## 2015-12-17 MED ORDER — GENTAMICIN SULFATE 40 MG/ML IJ SOLN: 1.5000 mg/kg | Freq: Two times a day (BID) | INTRAVENOUS | Status: DC

## 2015-12-17 MED ORDER — MEPERIDINE HCL 50 MG/ML IJ SOLN: 6.2500 mg | INTRAMUSCULAR | Status: DC | PRN

## 2015-12-17 MED ORDER — SUCCINYLCHOLINE CHLORIDE 200 MG/10ML IV SOSY
PREFILLED_SYRINGE | INTRAVENOUS | Status: AC
  Filled 2015-12-17: qty 10

## 2015-12-17 MED ORDER — FENTANYL CITRATE (PF) 100 MCG/2ML IJ SOLN
25.0000 ug | INTRAMUSCULAR | Status: DC | PRN
  Administered 2015-12-17: 25 ug via INTRAVENOUS

## 2015-12-17 MED ORDER — ESMOLOL HCL 100 MG/10ML IV SOLN
INTRAVENOUS | Status: AC
  Filled 2015-12-17: qty 10

## 2015-12-17 MED ORDER — ONDANSETRON HCL 4 MG/2ML IJ SOLN
INTRAMUSCULAR | Status: DC | PRN
  Administered 2015-12-17: 4 mg via INTRAVENOUS

## 2015-12-17 MED ORDER — LIDOCAINE 2% (20 MG/ML) 5 ML SYRINGE
INTRAMUSCULAR | Status: AC
  Filled 2015-12-17: qty 5

## 2015-12-17 MED ORDER — FENTANYL CITRATE (PF) 100 MCG/2ML IJ SOLN
INTRAMUSCULAR | Status: AC
  Filled 2015-12-17: qty 2

## 2015-12-17 MED ORDER — PHENYLEPHRINE 40 MCG/ML (10ML) SYRINGE FOR IV PUSH (FOR BLOOD PRESSURE SUPPORT)
PREFILLED_SYRINGE | INTRAVENOUS | Status: AC
  Filled 2015-12-17: qty 10

## 2015-12-17 MED ORDER — METOPROLOL TARTRATE 5 MG/5ML IV SOLN
INTRAVENOUS | Status: AC
  Filled 2015-12-17: qty 5

## 2015-12-17 MED ORDER — SODIUM CHLORIDE 0.9 % IV BOLUS (SEPSIS)
250.0000 mL | Freq: Once | INTRAVENOUS | Status: AC
  Administered 2015-12-17: 250 mL via INTRAVENOUS

## 2015-12-17 MED ORDER — DIPHENHYDRAMINE HCL 12.5 MG/5ML PO ELIX: 12.5000 mg | ORAL_SOLUTION | Freq: Four times a day (QID) | ORAL | Status: DC | PRN

## 2015-12-17 MED ORDER — GENTAMICIN IN SALINE 1.6-0.9 MG/ML-% IV SOLN
80.0000 mg | Freq: Two times a day (BID) | INTRAVENOUS | Status: DC
  Filled 2015-12-17: qty 50

## 2015-12-17 MED ORDER — OXYCODONE-ACETAMINOPHEN 5-325 MG PO TABS: 1.0000 | ORAL_TABLET | ORAL | Status: DC | PRN

## 2015-12-17 MED ORDER — PROPOFOL 10 MG/ML IV BOLUS
INTRAVENOUS | Status: DC | PRN
  Administered 2015-12-17: 100 mg via INTRAVENOUS

## 2015-12-17 MED ORDER — ONDANSETRON HCL 4 MG/2ML IJ SOLN: 4.0000 mg | Freq: Once | INTRAMUSCULAR | Status: DC | PRN

## 2015-12-17 MED ORDER — SODIUM CHLORIDE 0.9 % IV SOLN
INTRAVENOUS | Status: DC
  Administered 2015-12-17 (×2): via INTRAVENOUS

## 2015-12-17 MED ORDER — PHENYLEPHRINE 40 MCG/ML (10ML) SYRINGE FOR IV PUSH (FOR BLOOD PRESSURE SUPPORT)
PREFILLED_SYRINGE | INTRAVENOUS | Status: DC | PRN
  Administered 2015-12-17: 120 ug via INTRAVENOUS
  Administered 2015-12-17 (×2): 80 ug via INTRAVENOUS

## 2015-12-17 MED ORDER — EPHEDRINE 5 MG/ML INJ
INTRAVENOUS | Status: AC
  Filled 2015-12-17: qty 10

## 2015-12-17 MED ORDER — KETOROLAC TROMETHAMINE 15 MG/ML IJ SOLN: 15.0000 mg | Freq: Four times a day (QID) | INTRAMUSCULAR | Status: DC | PRN

## 2015-12-17 MED ORDER — ASPIRIN EC 81 MG PO TBEC
81.0000 mg | DELAYED_RELEASE_TABLET | Freq: Every day | ORAL | Status: DC
  Administered 2015-12-17 – 2015-12-18 (×2): 81 mg via ORAL
  Filled 2015-12-17 (×2): qty 1

## 2015-12-17 MED ORDER — GENTAMICIN IN SALINE 1.6-0.9 MG/ML-% IV SOLN
80.0000 mg | INTRAVENOUS | Status: DC
  Administered 2015-12-18: 80 mg via INTRAVENOUS
  Filled 2015-12-17: qty 50

## 2015-12-17 MED ORDER — DIPHENHYDRAMINE HCL 50 MG/ML IJ SOLN: 12.5000 mg | Freq: Four times a day (QID) | INTRAMUSCULAR | Status: DC | PRN

## 2015-12-17 MED ORDER — LACTATED RINGERS IV SOLN
INTRAVENOUS | Status: DC | PRN
  Administered 2015-12-17: 06:00:00 via INTRAVENOUS

## 2015-12-17 MED ORDER — ONDANSETRON HCL 4 MG/2ML IJ SOLN
INTRAMUSCULAR | Status: AC
  Filled 2015-12-17: qty 2

## 2015-12-17 MED ORDER — SUCCINYLCHOLINE CHLORIDE 200 MG/10ML IV SOSY
PREFILLED_SYRINGE | INTRAVENOUS | Status: DC | PRN
  Administered 2015-12-17: 100 mg via INTRAVENOUS

## 2015-12-17 MED ORDER — CEPHALEXIN 250 MG PO CAPS
250.0000 mg | ORAL_CAPSULE | Freq: Three times a day (TID) | ORAL | Status: DC
  Administered 2015-12-17: 250 mg via ORAL
  Filled 2015-12-17 (×2): qty 1

## 2015-12-17 MED ORDER — ESMOLOL HCL 100 MG/10ML IV SOLN
INTRAVENOUS | Status: DC | PRN
  Administered 2015-12-17: 20 mg via INTRAVENOUS

## 2015-12-17 MED ORDER — EPHEDRINE SULFATE-NACL 50-0.9 MG/10ML-% IV SOSY
PREFILLED_SYRINGE | INTRAVENOUS | Status: DC | PRN
  Administered 2015-12-17: 10 mg via INTRAVENOUS

## 2015-12-17 MED ORDER — SODIUM CHLORIDE 0.9 % IV SOLN
INTRAVENOUS | Status: DC | PRN
  Administered 2015-12-17: 3 mL

## 2015-12-17 MED ORDER — METOPROLOL SUCCINATE ER 25 MG PO TB24
25.0000 mg | ORAL_TABLET | Freq: Every day | ORAL | Status: DC
  Administered 2015-12-17 – 2015-12-19 (×3): 25 mg via ORAL
  Filled 2015-12-17 (×3): qty 1

## 2015-12-17 SURGICAL SUPPLY — 18 items
BAG URO CATCHER STRL LF (MISCELLANEOUS) ×3 IMPLANT
BASKET DAKOTA 1.9FR 11X120 (BASKET) IMPLANT
BASKET ZERO TIP NITINOL 2.4FR (BASKET) IMPLANT
BSKT STON RTRVL ZERO TP 2.4FR (BASKET)
CATH URET 5FR 28IN OPEN ENDED (CATHETERS) ×3 IMPLANT
CLOTH BEACON ORANGE TIMEOUT ST (SAFETY) ×3 IMPLANT
GLOVE BIOGEL M STRL SZ7.5 (GLOVE) ×3 IMPLANT
GOWN STRL REUS W/TWL XL LVL3 (GOWN DISPOSABLE) ×3 IMPLANT
GUIDEWIRE ANG ZIPWIRE 038X150 (WIRE) ×2 IMPLANT
GUIDEWIRE STR DUAL SENSOR (WIRE) ×3 IMPLANT
MANIFOLD NEPTUNE II (INSTRUMENTS) ×3 IMPLANT
PACK CYSTO (CUSTOM PROCEDURE TRAY) ×3 IMPLANT
SHEATH ACCESS URETERAL 24CM (SHEATH) IMPLANT
SHEATH ACCESS URETERAL 38CM (SHEATH) IMPLANT
SHEATH ACCESS URETERAL 54CM (SHEATH) IMPLANT
STENT URET 6FRX24 CONTOUR (STENTS) ×2 IMPLANT
TUBING CONNECTING 10 (TUBING) ×2 IMPLANT
TUBING CONNECTING 10' (TUBING) ×1

## 2015-12-17 NOTE — Anesthesia Postprocedure Evaluation (Signed)
Anesthesia Post Note  Patient: [name]  Procedure(s) Performed: Procedure(s) (LRB): CYSTOSCOPY WITH RETROGRADE PYELOGRAM/URETERAL STENT PLACEMENT (Left)  Patient location during evaluation: PACU Anesthesia Type: General Level of consciousness: awake and alert and patient cooperative Pain management: pain level controlled Vital Signs Assessment: post-procedure vital signs reviewed and stable Respiratory status: spontaneous breathing and respiratory function stable Cardiovascular status: stable Anesthetic complications: no    Last Vitals:  Vitals:   12/17/15 0730 12/17/15 0752  BP: (!) 103/58   Pulse:    Resp: 15   Temp: 37.7 C 37.2 C    Last Pain:  Vitals:   12/17/15 0655  TempSrc:   PainSc: [place]

## 2015-12-17 NOTE — Progress Notes (Signed)
Pharmacy - antibiotic brief note (gentamicin)  Repeat labs have returned since pharmacist saw for gentamicin dosing.  SCr = 1.79  For est CrCl = 28ml/min  Plan:  Based on increase in SCr, change to gentamicin to 80mg  IV q24h  Consider alternative agent if gentamicin thought to be cause of AKI  Consider stopping NSAID (toradol)  Doreene Eland, PharmD, BCPS.   Pager: [phone] 12/17/2015 3:05 PM

## 2015-12-17 NOTE — Progress Notes (Signed)
I took over care for this patient at 0930, I agree with the previous nurses assessment. 

## 2015-12-17 NOTE — Op Note (Signed)
Preoperative Diagnosis: Left ureteral stone with UTI  Postoperative Diagnosis:  Same  Procedure(s) Performed:   1. Cystourethroscopy 2. Left retrograde pyelogram 3. Left ureteral stent placement, 44fr x 24cm double J type, no string 4. Intraoperative fluoroscopy with interpretation <1hr  Surgeons:   Primary: Ardis Hughs, MD  Resident: Lorayne Bender, MD  Anesthesia:  General via endotracheal tube Anesthesiologist: Lyndle Herrlich, MD CRNA: Victoriano Lain, CRNA   Fluids:  See anesthesia record  Estimated blood loss:  Minimal  Specimens:  None  Cultures:  None  Drains:  None  Implant(s): 38fr x 24cm double J type ureteral stent, left  Complications:  None  Indications: 82 y.o. year-old male with history of BPH with a left 84mm UPJ stone and UTI. See consult note for full history. He was previously seen in the hospital where he provided written informed consent for the procedure after discussion of the risks, benefits, and alternatives. All questions answered. He is eager to proceed.  Findings:  BPH. Small bladder versus distal ureteral mucosal flap raised during wire placement. Successful left ureteral stent placement.  Interpretation of Retrograde Pyelogram: Mild hydro. UPJ stone not obviously visible on fluoroscopy. No extravasation of contrast.   Description:  The patient was correctly identified in the preop holding area where written informed consent as well potential risk and complication reviewed. He agreed. They were brought back to the operative suite where a preinduction timeout was performed. Once correct information was verified, anesthesia was induced  via endotracheal tube. They were then gently placed into dorsal lithotomy position with SCDs in place for VTE prophylaxis. They were prepped and draped in the usual sterile fashion. Antibiotics were continued per schedule. A second timeout was then performed.   We inserted a 34F rigid cystoscope per urethra with  copious lubrication and normal saline irrigation running. This demonstrated findings as described above. Using a series of wires and catheters, we obtained access to the left ureteral orifice. There was initially a small bladder versus distal ureteral mucosal flap raised during wire placement however we were able to cannulate the true lumen and confirmed this with a retrograde pyelogram with findings above. We then replaced the wire and advanced our 26fr x 24cm double J type ureteral stent over the wire under direct visual and fluoroscopic guidance. Removal of the wire demonstrated good curls within the kidney and bladder. The bladder was drained and hardware removed. He was awoken from anesthesia and returned to PACU in stable condition.   Post Op Plan:   1. Return to inpatient bed. Likely discharge later today on antibiotics. 2. Likely can keep OR date for TURP and add on left ureteroscopic stone extraction. We will discuss with Dr. Gaynelle Arabian.

## 2015-12-17 NOTE — Discharge Summary (Signed)
Date of admission: 12/16/2015  Date of discharge: 12/19/2015  Admission diagnosis: Left ureteral stone, UTI  Discharge diagnosis: Same  History and Physical: For full details, please see admission history and physical. Briefly, [name] is a 82 y.o. male with multiple medical problems, notably BPH and now a 3m left UPJ stone. He presented to the ED with concern for UTI and emesis precluding outpatient management. After discussing management/treatment options, he  elected to proceed with left ureteral stent placement.   Hospital Course: Brandon BERNARDSwas taken to the operating room on 12/17/2015 (HD#1) and underwent a left ureteral stent placement with Dr. HLouis Meckel He tolerated this procedure well and without complications. Postoperatively, the patient was able to be transferred to a regular hospital room following recovery from anesthesia. Later on POD#0, he was tachycardic and febrile. He was placed back on broad spectrum antibiotics with good response until return of cultures. By POD#2/HD#3, he had been afebrile for over 24 hours on antibiotics and met all criteria for discharge. He will be discharged on Keflex 250 TID for 7 days.    Blood pressure (!) 113/57, pulse 66, temperature 97.1 F (36.2 C), temperature source Oral, resp. rate 16, height 5' (1.524 m), weight 62 kg (136 lb 9.6 oz), SpO2 96 %.  Intake/Output Summary (Last 24 hours) at 12/19/15 0928 Last data filed at 12/19/15 0700  Gross per 24 hour  Intake              240 ml  Output              950 ml  Net             -710 ml   NAD Non-labored breathing Abdomen is soft, no CVA tenderness Ext symmetric  Laboratory values:   Recent Labs  12/16/15 1500 12/17/15 1350 12/18/15 0513  HGB 16.3 13.1 12.1*  HCT 47.1 38.9* 36.7*    Disposition: Home  Discharge instruction: They were instructed to be ambulatory. To take stool softeners with pain medication and not to drive while on narcotics.   Discharge  medications:     Medication List    STOP taking these medications   aspirin EC 81 MG tablet   Fish Oil 1200 MG Caps     TAKE these medications   calcium carbonate 500 MG chewable tablet Commonly known as:  TUMS - dosed in mg elemental calcium Chew 2 tablets by mouth daily as needed for indigestion.   cephALEXin 250 MG capsule Commonly known as:  KEFLEX Take 1 capsule (250 mg total) by mouth every 8 (eight) hours.   co-enzyme Q-10 30 MG capsule Take 200 mg by mouth daily.   cyanocobalamin 1000 MCG/ML injection Commonly known as:  (VITAMIN B-12) Inject 1 mL (1,000 mcg total) into the muscle every 30 (thirty) days.   dextromethorphan-guaiFENesin 30-600 MG 12hr tablet Commonly known as:  MUCINEX DM Take 1 tablet by mouth 2 (two) times daily as needed for cough.   lidocaine 2 % solution Commonly known as:  XYLOCAINE Apply to the oral sore with a q-tip every 4 hours as needed for pain.   magic mouthwash Soln Take 5 mLs by mouth 3 (three) times daily as needed for mouth pain.   metoprolol succinate 25 MG 24 hr tablet Commonly known as:  TOPROL-XL Take 1 tablet (25 mg total) by mouth daily.   mometasone 50 MCG/ACT nasal spray Commonly known as:  NASONEX Place 2 sprays into the nose daily as needed (congestion).  MULTIVITAMIN ADULTS PO Take 1 tablet by mouth daily.   nitroGLYCERIN 0.4 MG SL tablet Commonly known as:  NITROSTAT Place 1 tablet (0.4 mg total) under the tongue every 5 (five) minutes as needed. Up to 3 doses   oxyCODONE 5 MG immediate release tablet Commonly known as:  Oxy IR/ROXICODONE Take 1 tablet (5 mg total) by mouth every 4 (four) hours as needed for moderate pain.   tamsulosin 0.4 MG Caps capsule Commonly known as:  FLOMAX Take 0.4 mg by mouth daily after supper.   triamcinolone 0.1 % paste Commonly known as:  KENALOG Apply to the sore in your mouth twice daily for 1-2 weeks.       Followup: He will follow-up as scheduled for TURP and  likely add on left ureteroscopic stone extraction with Dr. Gaynelle Arabian.

## 2015-12-17 NOTE — Progress Notes (Signed)
Pharmacy Antibiotic Note  [name] is a 82 y.o. male admitted on 12/16/2015 with possible hydronephrosis, cholangisitis, liver infection.  Patient presented with c/o abdominal and back pain, bilious vomiting.  PMH significant for pancreatitis, choledocholithiasis, recent URI.  Pharmacy has been consulted for Clindamycin and Gentamicin dosing.  S/p stenting in OR by urology on 11/29, antibiotics were de-escalated to oral cephalexin.  He spiked a fever up to 102.2 and antibiotics were changed back to Clindamycin and Gentamicin with pharmacy consulted for medication dosing.   Plan:  Gentamicin 120 mg IV x 1 loading dose followed by 80mg  IV q12h  Peak goal: 6-8 mcg/ml  Trough goal: < 2 mcg/ml  Clindamycin 600 mg IV q8h  F/U cultures  Watch renal function  Order gentamicin peak and trough levels around 4th dose   Height: 5' (152.4 cm) Weight: 136 lb 9.6 oz (62 kg) IBW/kg (Calculated) : 50  Adjusted weight 55 kg  Temp (24hrs), Avg:99.7 F (37.6 C), Min:98 F (36.7 C), Max:102.2 F (39 C)   Recent Labs Lab 12/16/15 1500 12/16/15 1624 12/16/15 1856  WBC 10.6*  --   --   CREATININE 1.03  --   --   LATICACIDVEN  --  1.21 0.88    Estimated Creatinine Clearance: 44.3 mL/min (by C-G formula based on SCr of 1.03 mg/dL).    Allergies  Allergen Reactions  . Penicillins Other (See Comments)    Whelps, passed out Tolerates cephalosporins  Has patient had a PCN reaction causing immediate rash, facial/tongue/throat swelling, SOB or lightheadedness with hypotension:  yes Has patient had a PCN reaction causing severe rash involving mucus membranes or skin necrosis: no Has patient had a PCN reaction that required hospitalization: no Has patient had a PCN reaction occurring within the last 10 years: no If all of the above answers are "NO", then may proceed with Cephalosporin use.   . Sulfamethoxazole-Trimethoprim Rash  . Sulfonamide Derivatives Rash    Antimicrobials this  admission: 11/28 gent >>   11/28 clinda >>   11/29 cephalexin >> 11/29  Dose adjustments this admission:    Microbiology results: 11/28 BCx: sent 11/28 UCx: sent  11/29 MRSA PCR: neg   Thank you for allowing pharmacy to be a part of this patient's care.  Gretta Arab PharmD, BCPS Pager [phone] 12/17/2015 12:26 PM

## 2015-12-17 NOTE — Transfer of Care (Signed)
Immediate Anesthesia Transfer of Care Note  Patient: [name]  Procedure(s) Performed: Procedure(s): CYSTOSCOPY WITH RETROGRADE PYELOGRAM/URETERAL STENT PLACEMENT (Left)  Patient Location: PACU  Anesthesia Type:General  Level of Consciousness: awake, alert , oriented and patient cooperative  Airway & Oxygen Therapy: Patient Spontanous Breathing and Patient connected to face mask oxygen  Post-op Assessment: Report given to RN, Post -op Vital signs reviewed and stable and Patient moving all extremities  Post vital signs: Reviewed and stable  Last Vitals:  Vitals:   12/17/15 0000 12/17/15 0441  BP: 125/64 (!) 95/47  Pulse: (!) 128 (!) 109  Resp: 20 16  Temp: 37.7 C 36.8 C    Last Pain:  Vitals:   12/17/15 0441  TempSrc: Oral  PainSc:          Complications: No apparent anesthesia complications

## 2015-12-17 NOTE — Progress Notes (Signed)
Urology Progress Note  Subjective: The patient was doing well post stent placement. However, febrile and tachycardic now.  Without complaints. Poor PO intake, reported at baseline by daughter.  Objective: Vital signs in last 24 hours: Temp:  [98 F (36.7 C)-102.2 F (39 C)] 102.2 F (39 C) (11/29 1130) Pulse Rate:  [95-130] 110 (11/29 1130) Resp:  [14-22] 20 (11/29 1130) BP: (94-157)/(43-85) 94/44 (11/29 1130) SpO2:  [92 %-100 %] 97 % (11/29 1130) Weight:  [60.8 kg (134 lb 0.6 oz)-62 kg (136 lb 9.6 oz)] 62 kg (136 lb 9.6 oz) (11/29 0000)  Intake/Output from previous day: 11/28 0701 - 11/29 0700 In: 657.5 [I.V.:607.5; IV Piggyback:50] Out: 150 [Urine:150] Intake/Output this shift: Total I/O In: 300 [I.V.:300] Out: 300 [Urine:300]  Physical Exam:  General: Alert and oriented HEENT: [place]/AT CV: RRR Resp: Normal effort on [place] O2 GI: Soft, Nondistended Gu: No foley Extremities: Nontender, no erythema, no edema. Neuro: Interactive, grossly normal  Lab Results:  Recent Labs  12/16/15 1500  HGB 16.3  HCT 47.1      Assessment/Plan: HD#1, POD#0 s/p left ureteral stent placement for 68mm left UPJ stone and UTI.   1. CBC, BMP, Lactate now. IV fluids at 100/hr. 2. Resume broad spectrum antibiotics 3. May need foley catheter for maximum decompression if continues to spike fevers. 4. Follow-up cultures. 5. Will need follow-up for left ureteroscopic stone extraction, possibly to be combined with TURP next Monday by Dr. Gaynelle Arabian.    LOS: 0 days   Venessa Wickham R 12/17/2015, 1:10 PM

## 2015-12-17 NOTE — H&P (Deleted)
I have UPJ obstruction. HPI: [name] is a 82 year-old male established patient who is here for UPJ obstruction.  The pain is on the right side. Her problem was diagnosed 02/25/2006. She had the following imaging studies done: Renal Ultrasound and Nuclear renal scan. She has not had any treatment for her ureteropelvic junction obstruction.   She does not have a history of urinary infections. She has not had kidney stones. She is not having flank pain. Her renal function is normal.   Her last radiologic test to evaluate the kidneys was 05/22/2015.   Relative right UPJ obstruction: She occasionally will experience a little fullness in her right flank region when she drinks a lot of fluid. She's had a nuclear renogram in 2/08 that revealed that she was not obstructed and has normal renal function and has so far maintained no loss of renal parenchyma or increase in the relative hydronephrosis. She has been followed with renal ultrasounds.   Lasix renogram 04/10/15: Symmetric function bilaterally with severe, nonobstructing right UPJ stenosis.   CT scan 05/22/15: The right kidney has very large UPJ with a thickening of the urothelium in this area consistent with chronic urostasis. The renal artery comes off very high on the aorta and has an early branch to the lower pole which is likely the etiology of the UPJ obstruction.   Interval history: Surgery for reconstruction of her right UPJ has been delayed several times for various reasons. Most recently she was treated for 1st rib abnormality.    ALLERGIES: Cipro TABS Doxycycline Hyclate CAPS Macrobid CAPS   MEDICATIONS: BL Vitamin B-12 TABS Oral  Calcium TABS Oral  Digestive Enzymes CAPS Oral  Glucosamine TABS Oral  Hemp Oil  Magnesium TABS Oral  Nattokinase CAPS Oral  Omega 3 CAPS Oral  OxyCODONE HCl - 5 MG Oral Capsule 0 Oral  Ubiquinol CAPS Oral  Vitamin D3 2000 UNIT Oral Tablet Oral    GU PSH: None    PSH Notes: Gallbladder  Surgery  NON-GU PSH: None       GU PMH: UPJ Obstruction, Congenital (Stable, Chronic), Right, I have reassured her and she indicated that she would like to proceed with scheduling surgery and then will come in and discuss the surgery one last time with Dr. Louis Meckel before proceeding. - 10/27/2015 Crossing vessel and stricture of ureter w/o hydronephrosis, UPJ stricture, acquired - 05/12/2015 Abdominal Pain Unspec, Right flank pain, chronic - 03/31/2015 Right upper quadrant pain, Abdominal pain, RUQ (right upper quadrant) - 03/31/2015 Urinary Tract Inf, Unspec site, Recurrent UTI - 02/21/2015 Personal Hx Urinary Tract Infections, History of acute pyelonephritis - 2014      PMH Notes:  2007-05-09 09:07:19 - Note: Plantar Fasciitis   NON-GU PMH: Encounter for general adult medical examination without abnormal findings, Encounter for preventive health examination - 10/22/2014 Personal history of other endocrine, nutritional and metabolic disease, History of hypercholesterolemia - 2014    FAMILY HISTORY: Alzheimer's Disease - Mother Coronary Artery Disease - Father Family Health Status Number - Runs In Family Thyroid Disorder - Runs In Family   SOCIAL HISTORY: Marital Status: Married     Notes: Never A Smoker, Caffeine Use, Occupation:, Alcohol Use, Marital History - Currently Married, Tobacco Use   REVIEW OF SYSTEMS:    GU Review Male:  Patient denies frequent urination, hard to postpone urination, burning /pain with urination, get up at night to urinate, leakage of urine, stream starts and stops, trouble starting your stream, have to strain to urinate, and  currently pregnant.   Gastrointestinal (Upper):  Patient denies nausea, vomiting, and indigestion/ heartburn.   Gastrointestinal (Lower):  Patient denies diarrhea and constipation.   Constitutional:  Patient denies fever, night sweats, weight loss, and fatigue.   Skin:  Patient denies itching and skin rash/ lesion.   Eyes:  Patient denies  blurred vision and double vision.   Ears/ Nose/ Throat:  Patient denies sore throat and sinus problems.   Hematologic/Lymphatic:  Patient denies swollen glands and easy bruising.   Cardiovascular:  Patient denies leg swelling and chest pains.   Respiratory:  Patient denies cough and shortness of breath.   Endocrine:  Patient denies excessive thirst.   Musculoskeletal:  Patient denies back pain and joint pain.   Neurological:  Patient denies headaches and dizziness.   Psychologic:  Patient denies depression and anxiety.   VITAL SIGNS:      11/11/2015 12:55 PM    Weight 124 lb / 56.25 kg    Height 61 in / 154.94 cm    Pulse 74 /min    Temperature 98.0 F / 37 C    BMI 23.4 kg/m    MULTI-SYSTEM PHYSICAL EXAMINATION:     Constitutional: Well-nourished. No physical deformities. Normally developed. Good grooming.    Respiratory: No labored breathing, no use of accessory muscles. CTA-B    Cardiovascular: Normal temperature, normal extremity pulses, no swelling, no varicosities. RRR    Neurologic / Psychiatric: Oriented to time, oriented to place, oriented to person. No depression, no anxiety, no agitation.          PAST DATA REVIEWED:  Source Of History:  Patient  PROCEDURES:   Urinalysis - 81003  Dipstick Dipstick Cont'd Specimen: Voided Bilirubin: Neg Color: Yellow Ketones: Neg Appearance: Clear Blood: Neg Specific Gravity: 1.010 Protein: Neg pH: 6.0 Urobilinogen: 0.2 Glucose: Neg Nitrites: Neg  Leukocyte Esterase: Neg   ASSESSMENT:    ICD-10 Details 1 GU:  UPJ Obstruction, Congenital - Q62.11 Right, Stable, Chronic - The patient has an intermittently obstructing right renal pelvis/UPJ. Based on the most recent imaging appears that the patient has a very high takeoff of the right renal artery from the aorta with an early branch to the lower pole which is causing the UPJ obstruction.  PLAN:  Document  Letter(s):  Created for Patient: Clinical Summary  Notes:  plan for  surgery as discussed in previous encounters.   I again went over the surgery with the patient quite a lot of detail including the surgery itself, the length of stay, the expected recovery time, and the aspect of having an indwelling stent for up to 4 weeks.

## 2015-12-17 NOTE — Progress Notes (Signed)
Pt blood pressure 81/46.  MD made aware.  Verbal order to give 250 cc fluid bolus and recheck vitals in 1 hours.  Will continue to monitor closely.

## 2015-12-17 NOTE — H&P (Signed)
Please see consult note.  

## 2015-12-17 NOTE — Anesthesia Procedure Notes (Signed)
Procedure Name: Intubation Date/Time: 12/17/2015 6:12 AM Performed by: Carleene Cooper A Pre-anesthesia Checklist: Patient identified, Emergency Drugs available, Suction available, Patient being monitored and Timeout performed Patient Re-evaluated:Patient Re-evaluated prior to inductionOxygen Delivery Method: Circle system utilized Preoxygenation: Pre-oxygenation with 100% oxygen Intubation Type: IV induction Ventilation: Mask ventilation without difficulty Laryngoscope Size: Mac and 4 Grade View: Grade I Tube type: Oral Tube size: 7.5 mm Number of attempts: 1 Airway Equipment and Method: Stylet Placement Confirmation: ETT inserted through vocal cords under direct vision,  positive ETCO2 and breath sounds checked- equal and bilateral Secured at: 21 cm Tube secured with: Tape Dental Injury: Teeth and Oropharynx as per pre-operative assessment

## 2015-12-18 ENCOUNTER — Encounter (HOSPITAL_COMMUNITY): Payer: Self-pay | Admitting: Urology

## 2015-12-18 DIAGNOSIS — I509 Heart failure, unspecified: Secondary | ICD-10-CM | POA: Diagnosis present

## 2015-12-18 DIAGNOSIS — Z87891 Personal history of nicotine dependence: Secondary | ICD-10-CM | POA: Diagnosis not present

## 2015-12-18 DIAGNOSIS — Z8249 Family history of ischemic heart disease and other diseases of the circulatory system: Secondary | ICD-10-CM | POA: Diagnosis not present

## 2015-12-18 DIAGNOSIS — K5730 Diverticulosis of large intestine without perforation or abscess without bleeding: Secondary | ICD-10-CM | POA: Diagnosis present

## 2015-12-18 DIAGNOSIS — N400 Enlarged prostate without lower urinary tract symptoms: Secondary | ICD-10-CM | POA: Diagnosis present

## 2015-12-18 DIAGNOSIS — I2510 Atherosclerotic heart disease of native coronary artery without angina pectoris: Secondary | ICD-10-CM | POA: Diagnosis present

## 2015-12-18 DIAGNOSIS — N202 Calculus of kidney with calculus of ureter: Secondary | ICD-10-CM | POA: Diagnosis present

## 2015-12-18 DIAGNOSIS — Z86010 Personal history of colonic polyps: Secondary | ICD-10-CM | POA: Diagnosis not present

## 2015-12-18 DIAGNOSIS — R109 Unspecified abdominal pain: Secondary | ICD-10-CM | POA: Diagnosis present

## 2015-12-18 DIAGNOSIS — Z96641 Presence of right artificial hip joint: Secondary | ICD-10-CM | POA: Diagnosis present

## 2015-12-18 DIAGNOSIS — E785 Hyperlipidemia, unspecified: Secondary | ICD-10-CM | POA: Diagnosis present

## 2015-12-18 DIAGNOSIS — Z7951 Long term (current) use of inhaled steroids: Secondary | ICD-10-CM | POA: Diagnosis not present

## 2015-12-18 DIAGNOSIS — Z880 Allergy status to penicillin: Secondary | ICD-10-CM | POA: Diagnosis not present

## 2015-12-18 DIAGNOSIS — I110 Hypertensive heart disease with heart failure: Secondary | ICD-10-CM | POA: Diagnosis present

## 2015-12-18 DIAGNOSIS — K1370 Unspecified lesions of oral mucosa: Secondary | ICD-10-CM | POA: Diagnosis not present

## 2015-12-18 DIAGNOSIS — R1319 Other dysphagia: Secondary | ICD-10-CM | POA: Diagnosis present

## 2015-12-18 DIAGNOSIS — N201 Calculus of ureter: Secondary | ICD-10-CM | POA: Diagnosis not present

## 2015-12-18 DIAGNOSIS — N12 Tubulo-interstitial nephritis, not specified as acute or chronic: Secondary | ICD-10-CM | POA: Diagnosis present

## 2015-12-18 DIAGNOSIS — Z79899 Other long term (current) drug therapy: Secondary | ICD-10-CM | POA: Diagnosis not present

## 2015-12-18 DIAGNOSIS — Z882 Allergy status to sulfonamides status: Secondary | ICD-10-CM | POA: Diagnosis not present

## 2015-12-18 DIAGNOSIS — N390 Urinary tract infection, site not specified: Secondary | ICD-10-CM | POA: Diagnosis not present

## 2015-12-18 DIAGNOSIS — G4730 Sleep apnea, unspecified: Secondary | ICD-10-CM | POA: Diagnosis present

## 2015-12-18 DIAGNOSIS — Z9049 Acquired absence of other specified parts of digestive tract: Secondary | ICD-10-CM | POA: Diagnosis not present

## 2015-12-18 DIAGNOSIS — Z800 Family history of malignant neoplasm of digestive organs: Secondary | ICD-10-CM | POA: Diagnosis not present

## 2015-12-18 DIAGNOSIS — Z96651 Presence of right artificial knee joint: Secondary | ICD-10-CM | POA: Diagnosis present

## 2015-12-18 DIAGNOSIS — D510 Vitamin B12 deficiency anemia due to intrinsic factor deficiency: Secondary | ICD-10-CM | POA: Diagnosis present

## 2015-12-18 LAB — CBC
HCT: 36.7 % — ABNORMAL LOW (ref 39.0–52.0)
HEMOGLOBIN: 12.1 g/dL — AB (ref 13.0–17.0)
MCH: 28 pg (ref 26.0–34.0)
MCHC: 33 g/dL (ref 30.0–36.0)
MCV: 85 fL (ref 78.0–100.0)
PLATELETS: 53 10*3/uL — AB (ref 150–400)
RBC: 4.32 MIL/uL (ref 4.22–5.81)
RDW: 16.1 % — AB (ref 11.5–15.5)
WBC: 13.5 10*3/uL — ABNORMAL HIGH (ref 4.0–10.5)

## 2015-12-18 LAB — BASIC METABOLIC PANEL
Anion gap: 4 — ABNORMAL LOW (ref 5–15)
BUN: 16 mg/dL (ref 6–20)
CALCIUM: 7.8 mg/dL — AB (ref 8.9–10.3)
CO2: 27 mmol/L (ref 22–32)
CREATININE: 1.23 mg/dL (ref 0.61–1.24)
Chloride: 107 mmol/L (ref 101–111)
GFR calc non Af Amer: 54 mL/min — ABNORMAL LOW (ref >60)
Glucose, Bld: 131 mg/dL — ABNORMAL HIGH (ref 65–99)
Potassium: 4 mmol/L (ref 3.5–5.1)
SODIUM: 138 mmol/L (ref 135–145)

## 2015-12-18 MED ORDER — TRAMADOL HCL 50 MG PO TABS: 50.0000 mg | ORAL_TABLET | Freq: Four times a day (QID) | ORAL | Status: DC | PRN

## 2015-12-18 MED ORDER — CEPHALEXIN 250 MG PO CAPS
250.0000 mg | ORAL_CAPSULE | Freq: Three times a day (TID) | ORAL | Status: DC
  Administered 2015-12-18 – 2015-12-19 (×2): 250 mg via ORAL
  Filled 2015-12-18 (×2): qty 1

## 2015-12-18 MED ORDER — MAGIC MOUTHWASH W/LIDOCAINE
5.0000 mL | Freq: Four times a day (QID) | ORAL | Status: DC | PRN
  Administered 2015-12-18 – 2015-12-19 (×2): 5 mL via ORAL
  Filled 2015-12-18 (×3): qty 5

## 2015-12-18 NOTE — Progress Notes (Signed)
Urology Progress Note  Subjective: No complaints overnight. He has remained afebrile since yesterday afternoon. Complaining of mouth sore on roof of mouth.   Objective: Vital signs in last 24 hours: Temp:  [97.9 F (36.6 C)-102.2 F (39 C)] 98.2 F (36.8 C) (11/30 0444) Pulse Rate:  [92-120] 95 (11/30 0444) Resp:  [14-20] 16 (11/30 0444) BP: (81-115)/(43-62) 115/62 (11/30 0444) SpO2:  [95 %-100 %] 97 % (11/30 0444) FiO2 (%):  [2 %] 2 % (11/29 1408)  Intake/Output from previous day: 11/29 0701 - 11/30 0700 In: 1904.2 [P.O.:340; I.V.:1464.2; IV Piggyback:100] Out: 2625 [Urine:2625]   Intake/Output this shift: No intake/output data recorded.  Physical Exam:  General: Alert and oriented HEENT: [place]/AT. Small punctate sore on roof of mouth.  CV: RRR Resp: Normal effort on RA GI: Soft, Nondistended Gu: No foley Extremities: Nontender, no erythema, no edema. Neuro: Interactive, grossly normal  Lab Results:  Recent Labs  12/16/15 1500 12/17/15 1350 12/18/15 0513  HGB 16.3 13.1 12.1*  HCT 47.1 38.9* 36.7*      Assessment/Plan: HD#2, POD#1 s/p left ureteral stent placement for 79mm left UPJ stone and UTI. Concern for sepsis post-op. He has responded well to IV fluid and antibiotics.   1. Continue broad spectrum antibiotics for now. 2. Hopefully get culture results today and narrow to PO med he is not allergic to. 3. Will need follow-up for left ureteroscopic stone extraction, possibly to be combined with TURP next Monday by Dr. Gaynelle Arabian.   Cheronda Erck R 12/18/2015, 7:40 AM

## 2015-12-19 ENCOUNTER — Other Ambulatory Visit: Payer: Self-pay | Admitting: Urology

## 2015-12-19 ENCOUNTER — Encounter (HOSPITAL_BASED_OUTPATIENT_CLINIC_OR_DEPARTMENT_OTHER): Payer: Self-pay | Admitting: *Deleted

## 2015-12-19 MED ORDER — CEPHALEXIN 250 MG PO CAPS: 250.0000 mg | ORAL_CAPSULE | Freq: Three times a day (TID) | ORAL | 0 refills | Status: DC

## 2015-12-19 NOTE — Progress Notes (Signed)
NPO AFTER MN.  ARRIVE AT 0730.  CURRENT LAB RESULTS AND EKG IN CHART AND EPIC. WILL TAKE TOPROL AM DOS W/ SIPS OF WATER.  LVM FOR PAM , OR SCHEDULER ,  NEED INFORMED CONSENT ORDER UPDATED TO INCLUDE URETEROSCOPIC STONE EXTRACTION.

## 2015-12-19 NOTE — Discharge Instructions (Signed)
[facility] for fever >101.55F by mouth, uncontrolled nausea or vomiting, pain uncontrolled by medication, decreased urine output, signs of wound infection (rapidly spreading redness/swelling, purulent discharge, increasing bleeding from wounds, or separation of wound); also call for any new or concerning symptoms.   Do not drive while taking narcotic pain medications. Take Colace and/or Senna while taking narcotic pain medication. You may take over-the-counter Laxatives such as Miralax, Senna, or Milk of Magnesia. Stop taking these medications if you develop diarrhea.  Take all medications as prescribed, especially the antibiotics.  Hold your Aspirin for surgery. Do not eat or drink anything after midnight the night before your surgery.

## 2015-12-20 LAB — URINE CULTURE

## 2015-12-21 LAB — CULTURE, BLOOD (ROUTINE X 2)
Culture: NO GROWTH
Culture: NO GROWTH

## 2015-12-22 ENCOUNTER — Other Ambulatory Visit: Payer: Self-pay | Admitting: Urology

## 2015-12-22 ENCOUNTER — Encounter (HOSPITAL_COMMUNITY)
Admission: RE | Disposition: A | Discharge status: Home / Self Care | Payer: Self-pay | Source: Ambulatory Visit | Attending: Urology

## 2015-12-22 ENCOUNTER — Encounter (HOSPITAL_BASED_OUTPATIENT_CLINIC_OR_DEPARTMENT_OTHER): Payer: Self-pay | Admitting: *Deleted

## 2015-12-22 ENCOUNTER — Telehealth: Payer: Self-pay | Admitting: *Deleted

## 2015-12-22 ENCOUNTER — Observation Stay (HOSPITAL_BASED_OUTPATIENT_CLINIC_OR_DEPARTMENT_OTHER)
Admission: RE | Admit: 2015-12-22 | Discharge: 2015-12-23 | Disposition: A | Discharge status: Home / Self Care | Payer: Medicare Other | Source: Ambulatory Visit | Attending: Urology | Admitting: Urology

## 2015-12-22 ENCOUNTER — Ambulatory Visit (HOSPITAL_BASED_OUTPATIENT_CLINIC_OR_DEPARTMENT_OTHER): Payer: Medicare Other | Admitting: Anesthesiology

## 2015-12-22 DIAGNOSIS — G4730 Sleep apnea, unspecified: Secondary | ICD-10-CM | POA: Diagnosis not present

## 2015-12-22 DIAGNOSIS — M1990 Unspecified osteoarthritis, unspecified site: Secondary | ICD-10-CM | POA: Diagnosis not present

## 2015-12-22 DIAGNOSIS — N9971 Accidental puncture and laceration of a genitourinary system organ or structure during a genitourinary system procedure: Secondary | ICD-10-CM | POA: Diagnosis not present

## 2015-12-22 DIAGNOSIS — I509 Heart failure, unspecified: Secondary | ICD-10-CM | POA: Diagnosis not present

## 2015-12-22 DIAGNOSIS — R300 Dysuria: Secondary | ICD-10-CM | POA: Diagnosis not present

## 2015-12-22 DIAGNOSIS — Z800 Family history of malignant neoplasm of digestive organs: Secondary | ICD-10-CM | POA: Diagnosis not present

## 2015-12-22 DIAGNOSIS — N401 Enlarged prostate with lower urinary tract symptoms: Secondary | ICD-10-CM | POA: Insufficient documentation

## 2015-12-22 DIAGNOSIS — D649 Anemia, unspecified: Secondary | ICD-10-CM | POA: Insufficient documentation

## 2015-12-22 DIAGNOSIS — Z881 Allergy status to other antibiotic agents status: Secondary | ICD-10-CM | POA: Diagnosis not present

## 2015-12-22 DIAGNOSIS — K449 Diaphragmatic hernia without obstruction or gangrene: Secondary | ICD-10-CM | POA: Diagnosis not present

## 2015-12-22 DIAGNOSIS — N400 Enlarged prostate without lower urinary tract symptoms: Secondary | ICD-10-CM | POA: Diagnosis present

## 2015-12-22 DIAGNOSIS — Z888 Allergy status to other drugs, medicaments and biological substances status: Secondary | ICD-10-CM | POA: Diagnosis not present

## 2015-12-22 DIAGNOSIS — R351 Nocturia: Secondary | ICD-10-CM | POA: Insufficient documentation

## 2015-12-22 DIAGNOSIS — I2510 Atherosclerotic heart disease of native coronary artery without angina pectoris: Secondary | ICD-10-CM | POA: Insufficient documentation

## 2015-12-22 DIAGNOSIS — I110 Hypertensive heart disease with heart failure: Secondary | ICD-10-CM | POA: Diagnosis not present

## 2015-12-22 DIAGNOSIS — D696 Thrombocytopenia, unspecified: Secondary | ICD-10-CM | POA: Insufficient documentation

## 2015-12-22 DIAGNOSIS — F329 Major depressive disorder, single episode, unspecified: Secondary | ICD-10-CM | POA: Diagnosis not present

## 2015-12-22 DIAGNOSIS — E785 Hyperlipidemia, unspecified: Secondary | ICD-10-CM | POA: Diagnosis not present

## 2015-12-22 DIAGNOSIS — R3912 Poor urinary stream: Secondary | ICD-10-CM | POA: Insufficient documentation

## 2015-12-22 DIAGNOSIS — R3915 Urgency of urination: Secondary | ICD-10-CM | POA: Diagnosis not present

## 2015-12-22 DIAGNOSIS — N201 Calculus of ureter: Principal | ICD-10-CM | POA: Insufficient documentation

## 2015-12-22 DIAGNOSIS — Z7982 Long term (current) use of aspirin: Secondary | ICD-10-CM | POA: Diagnosis not present

## 2015-12-22 DIAGNOSIS — Z87891 Personal history of nicotine dependence: Secondary | ICD-10-CM | POA: Insufficient documentation

## 2015-12-22 DIAGNOSIS — Y838 Other surgical procedures as the cause of abnormal reaction of the patient, or of later complication, without mention of misadventure at the time of the procedure: Secondary | ICD-10-CM | POA: Insufficient documentation

## 2015-12-22 DIAGNOSIS — R350 Frequency of micturition: Secondary | ICD-10-CM | POA: Insufficient documentation

## 2015-12-22 DIAGNOSIS — R338 Other retention of urine: Secondary | ICD-10-CM | POA: Diagnosis not present

## 2015-12-22 DIAGNOSIS — Z87442 Personal history of urinary calculi: Secondary | ICD-10-CM | POA: Insufficient documentation

## 2015-12-22 HISTORY — DX: Diaphragmatic hernia without obstruction or gangrene: K44.9

## 2015-12-22 HISTORY — DX: Personal history of other diseases of the digestive system: Z87.19

## 2015-12-22 HISTORY — DX: Other lesions of oral mucosa: K13.79

## 2015-12-22 HISTORY — DX: Spondylosis, unspecified: M47.9

## 2015-12-22 HISTORY — DX: Personal history of other diseases of the circulatory system: Z86.79

## 2015-12-22 HISTORY — DX: Personal history of adenomatous and serrated colon polyps: Z86.0101

## 2015-12-22 HISTORY — DX: Dysuria: R30.0

## 2015-12-22 HISTORY — DX: Feeling of incomplete bladder emptying: R39.14

## 2015-12-22 HISTORY — DX: Nocturia: R35.1

## 2015-12-22 HISTORY — DX: Disorder of kidney and ureter, unspecified: N28.9

## 2015-12-22 HISTORY — PX: CYSTOSCOPY WITH RETROGRADE PYELOGRAM, URETEROSCOPY AND STENT PLACEMENT: SHX5789

## 2015-12-22 HISTORY — DX: Obstructive sleep apnea (adult) (pediatric): G47.33

## 2015-12-22 HISTORY — DX: Other obstructive and reflux uropathy: N13.8

## 2015-12-22 HISTORY — DX: Induration penis plastica: N48.6

## 2015-12-22 HISTORY — DX: Other obstructive and reflux uropathy: N40.1

## 2015-12-22 HISTORY — DX: Personal history of other diseases of male genital organs: Z87.438

## 2015-12-22 HISTORY — DX: Monocytosis (symptomatic): D72.821

## 2015-12-22 HISTORY — PX: HOLMIUM LASER APPLICATION: SHX5852

## 2015-12-22 HISTORY — DX: Pain in unspecified joint: M25.50

## 2015-12-22 HISTORY — DX: Personal history of colonic polyps: Z86.010

## 2015-12-22 HISTORY — DX: Elevated prostate specific antigen (PSA): R97.20

## 2015-12-22 SURGERY — THULIUM LASER TURP (TRANSURETHRAL RESECTION OF PROSTATE)
Anesthesia: General | Site: Ureter

## 2015-12-22 MED ORDER — BELLADONNA ALKALOIDS-OPIUM 16.2-60 MG RE SUPP
RECTAL | Status: DC | PRN
  Administered 2015-12-22: 1 via RECTAL

## 2015-12-22 MED ORDER — DEXTROSE-NACL 5-0.45 % IV SOLN
INTRAVENOUS | Status: DC
  Administered 2015-12-22 – 2015-12-23 (×2): via INTRAVENOUS
  Filled 2015-12-22: qty 1000

## 2015-12-22 MED ORDER — LACTATED RINGERS IV SOLN
INTRAVENOUS | Status: DC
  Administered 2015-12-22 (×2): via INTRAVENOUS
  Filled 2015-12-22: qty 1000

## 2015-12-22 MED ORDER — PHENYLEPHRINE HCL 10 MG/ML IJ SOLN
INTRAMUSCULAR | Status: DC | PRN
  Administered 2015-12-22 (×2): 120 ug via INTRAVENOUS

## 2015-12-22 MED ORDER — VANCOMYCIN HCL 10 G IV SOLR
1250.0000 mg | INTRAVENOUS | Status: DC
  Administered 2015-12-23: 1250 mg via INTRAVENOUS
  Filled 2015-12-22: qty 1250

## 2015-12-22 MED ORDER — ACETAMINOPHEN 500 MG PO TABS
1000.0000 mg | ORAL_TABLET | ORAL | Status: AC
  Administered 2015-12-22: 1000 mg via ORAL
  Filled 2015-12-22: qty 2

## 2015-12-22 MED ORDER — ONDANSETRON HCL 4 MG/2ML IJ SOLN
4.0000 mg | INTRAMUSCULAR | Status: DC | PRN
  Administered 2015-12-22 – 2015-12-23 (×2): 4 mg via INTRAVENOUS
  Filled 2015-12-22 (×3): qty 2

## 2015-12-22 MED ORDER — HYDROCODONE-ACETAMINOPHEN 5-325 MG PO TABS
1.0000 | ORAL_TABLET | ORAL | Status: DC | PRN
  Administered 2015-12-22 – 2015-12-23 (×4): 1 via ORAL
  Filled 2015-12-22 (×5): qty 1

## 2015-12-22 MED ORDER — IOHEXOL 300 MG/ML  SOLN
INTRAMUSCULAR | Status: DC | PRN
  Administered 2015-12-22: 7 mL

## 2015-12-22 MED ORDER — FENTANYL CITRATE (PF) 100 MCG/2ML IJ SOLN
INTRAMUSCULAR | Status: DC | PRN
  Administered 2015-12-22 (×4): 25 ug via INTRAVENOUS

## 2015-12-22 MED ORDER — DIPHENHYDRAMINE HCL 12.5 MG/5ML PO ELIX
12.5000 mg | ORAL_SOLUTION | Freq: Four times a day (QID) | ORAL | Status: DC | PRN
  Filled 2015-12-22: qty 5

## 2015-12-22 MED ORDER — MORPHINE SULFATE (PF) 2 MG/ML IV SOLN
1.0000 mg | INTRAVENOUS | Status: DC | PRN
  Administered 2015-12-22 (×2): 1 mg via INTRAVENOUS
  Administered 2015-12-22 – 2015-12-23 (×4): 2 mg via INTRAVENOUS
  Filled 2015-12-22 (×7): qty 1

## 2015-12-22 MED ORDER — ROCURONIUM BROMIDE 100 MG/10ML IV SOLN
INTRAVENOUS | Status: DC | PRN
  Administered 2015-12-22: 10 mg via INTRAVENOUS
  Administered 2015-12-22: 40 mg via INTRAVENOUS

## 2015-12-22 MED ORDER — CEFAZOLIN SODIUM-DEXTROSE 2-4 GM/100ML-% IV SOLN
2.0000 g | INTRAVENOUS | Status: DC
  Filled 2015-12-22: qty 100

## 2015-12-22 MED ORDER — ACETAMINOPHEN 325 MG PO TABS
650.0000 mg | ORAL_TABLET | ORAL | Status: DC | PRN
  Filled 2015-12-22: qty 2

## 2015-12-22 MED ORDER — DIPHENHYDRAMINE HCL 50 MG/ML IJ SOLN
12.5000 mg | Freq: Four times a day (QID) | INTRAMUSCULAR | Status: DC | PRN
  Filled 2015-12-22: qty 0.25

## 2015-12-22 MED ORDER — ACETAMINOPHEN 500 MG PO TABS
1000.0000 mg | ORAL_TABLET | Freq: Four times a day (QID) | ORAL | Status: DC | PRN
  Filled 2015-12-22: qty 2

## 2015-12-22 MED ORDER — DEXAMETHASONE SODIUM PHOSPHATE 4 MG/ML IJ SOLN
INTRAMUSCULAR | Status: DC | PRN
  Administered 2015-12-22: 10 mg via INTRAVENOUS

## 2015-12-22 MED ORDER — PROPOFOL 10 MG/ML IV BOLUS
INTRAVENOUS | Status: DC | PRN
  Administered 2015-12-22: 150 mg via INTRAVENOUS

## 2015-12-22 MED ORDER — SODIUM CHLORIDE 0.9 % IR SOLN
Status: DC | PRN
  Administered 2015-12-22 (×4): 3000 mL via INTRAVESICAL

## 2015-12-22 MED ORDER — ACETAMINOPHEN 500 MG PO TABS
ORAL_TABLET | ORAL | Status: AC
  Filled 2015-12-22: qty 2

## 2015-12-22 MED ORDER — CEFAZOLIN SODIUM-DEXTROSE 2-4 GM/100ML-% IV SOLN
INTRAVENOUS | Status: AC
  Filled 2015-12-22: qty 100

## 2015-12-22 MED ORDER — HYDROMORPHONE HCL 1 MG/ML IJ SOLN
0.2500 mg | INTRAMUSCULAR | Status: DC | PRN
  Administered 2015-12-22 (×2): 0.25 mg via INTRAVENOUS
  Filled 2015-12-22: qty 0.5

## 2015-12-22 MED ORDER — ONDANSETRON HCL 4 MG/2ML IJ SOLN
INTRAMUSCULAR | Status: DC | PRN
  Administered 2015-12-22: 4 mg via INTRAVENOUS

## 2015-12-22 MED ORDER — VANCOMYCIN HCL IN DEXTROSE 1-5 GM/200ML-% IV SOLN
1000.0000 mg | Freq: Once | INTRAVENOUS | Status: AC
  Administered 2015-12-22: 1000 mg via INTRAVENOUS
  Filled 2015-12-22 (×2): qty 200

## 2015-12-22 MED ORDER — METOPROLOL SUCCINATE ER 25 MG PO TB24
25.0000 mg | ORAL_TABLET | Freq: Every day | ORAL | Status: DC
  Administered 2015-12-23: 25 mg via ORAL
  Filled 2015-12-22 (×2): qty 1

## 2015-12-22 MED ORDER — DOCUSATE SODIUM 100 MG PO CAPS
100.0000 mg | ORAL_CAPSULE | Freq: Two times a day (BID) | ORAL | Status: DC
  Administered 2015-12-22 – 2015-12-23 (×2): 100 mg via ORAL
  Filled 2015-12-22 (×4): qty 1

## 2015-12-22 MED ORDER — LIDOCAINE HCL (CARDIAC) 20 MG/ML IV SOLN
INTRAVENOUS | Status: DC | PRN
  Administered 2015-12-22: 70 mg via INTRAVENOUS

## 2015-12-22 MED ORDER — SUGAMMADEX SODIUM 200 MG/2ML IV SOLN
INTRAVENOUS | Status: DC | PRN
  Administered 2015-12-22: 200 mg via INTRAVENOUS

## 2015-12-22 MED ORDER — TAMSULOSIN HCL 0.4 MG PO CAPS
0.4000 mg | ORAL_CAPSULE | Freq: Every day | ORAL | Status: DC
  Administered 2015-12-22 – 2015-12-23 (×2): 0.4 mg via ORAL
  Filled 2015-12-22 (×3): qty 1

## 2015-12-22 SURGICAL SUPPLY — 32 items
BAG DRAIN URO-CYSTO SKYTR STRL (DRAIN) ×5 IMPLANT
BAG DRN UROCATH (DRAIN) ×3
BAG URINE DRAINAGE (UROLOGICAL SUPPLIES) ×3 IMPLANT
BASKET LASER NITINOL 1.9FR (BASKET) IMPLANT
BASKET ZERO TIP NITINOL 2.4FR (BASKET) ×2 IMPLANT
BSKT STON RTRVL 120 1.9FR (BASKET)
BSKT STON RTRVL ZERO TP 2.4FR (BASKET) ×3
CATH FOLEY 2WAY SLVR  5CC 24FR (CATHETERS)
CATH FOLEY 2WAY SLVR 30CC 20FR (CATHETERS) ×2 IMPLANT
CATH FOLEY 2WAY SLVR 5CC 24FR (CATHETERS) IMPLANT
CATH INTERMIT  6FR 70CM (CATHETERS) ×2 IMPLANT
CATH URET DUAL LUMEN 6-10FR 50 (CATHETERS) IMPLANT
CLOTH BEACON ORANGE TIMEOUT ST (SAFETY) ×5 IMPLANT
FIBER LASER TRAC TIP (UROLOGICAL SUPPLIES) ×2 IMPLANT
GLOVE BIO SURGEON STRL SZ7.5 (GLOVE) ×9 IMPLANT
GLOVE BIOGEL PI IND STRL 7.5 (GLOVE) IMPLANT
GLOVE BIOGEL PI INDICATOR 7.5 (GLOVE) ×4
GOWN STRL REUS W/ TWL LRG LVL3 (GOWN DISPOSABLE) ×3 IMPLANT
GOWN STRL REUS W/ TWL XL LVL3 (GOWN DISPOSABLE) ×3 IMPLANT
GOWN STRL REUS W/TWL LRG LVL3 (GOWN DISPOSABLE) ×9 IMPLANT
GOWN STRL REUS W/TWL XL LVL3 (GOWN DISPOSABLE) ×10
GUIDEWIRE ANG ZIPWIRE 038X150 (WIRE) ×2 IMPLANT
GUIDEWIRE STR DUAL SENSOR (WIRE) ×2 IMPLANT
HOLDER FOLEY CATH W/STRAP (MISCELLANEOUS) IMPLANT
IV NS IRRIG 3000ML ARTHROMATIC (IV SOLUTION) ×14 IMPLANT
KIT ROOM TURNOVER WOR (KITS) ×5 IMPLANT
LASER REVOLIX PROCEDURE (MISCELLANEOUS) ×5 IMPLANT
MANIFOLD NEPTUNE II (INSTRUMENTS) ×2 IMPLANT
PACK CYSTO (CUSTOM PROCEDURE TRAY) ×5 IMPLANT
SHEATH ACCESS URETERAL 38CM (SHEATH) ×2 IMPLANT
STENT URET 6FRX24 CONTOUR (STENTS) ×2 IMPLANT
SYRINGE IRR TOOMEY STRL 70CC (SYRINGE) IMPLANT

## 2015-12-22 NOTE — Progress Notes (Signed)
Pharmacy Antibiotic Note  [name] is a 82 y.o. male with BPH who presents for [place] laser prostatectomy this AM. However, He was admitted last week for UTI and ureteral stone (Left), and JJ stent placement. He was treated with gent/clinda >> discharged on keflex.  He will have Vancomycin IV this AM (per C/S urine with CoNS); and ureteroscopy , laser stone, JJ stent, and laser of prostate.  Pharmacy consulted to continue IV vancomycin while admitted.  Plan:  Vancomycin 1g IV given pre-op at 09:28, continue with 1250mg  IV q24h with next dose 12/5 at 06:00.  Monitor renal function and plan to check trough at steady state if continues; goal 10-15 mcg/ml.  Height: 5' (152.4 cm) Weight: 134 lb 8 oz (61 kg) IBW/kg (Calculated) : 50  Temp (24hrs), Avg:97.7 F (36.5 C), Min:97.5 F (36.4 C), Max:97.8 F (36.6 C)   Recent Labs Lab 12/16/15 1500 12/16/15 1624 12/16/15 1856 12/17/15 1350 12/18/15 0513  WBC 10.6*  --   --  28.3* 13.5*  CREATININE 1.03  --   --  1.79* 1.23  LATICACIDVEN  --  1.21 0.88  --   --     Estimated Creatinine Clearance: 36.9 mL/min (by C-G formula based on SCr of 1.23 mg/dL).    Allergies  Allergen Reactions  . Penicillins Hives and Other (See Comments)    Whelps, passed out Tolerates cephalosporins  Has patient had a PCN reaction causing immediate rash, facial/tongue/throat swelling, SOB or lightheadedness with hypotension:  yes Has patient had a PCN reaction causing severe rash involving mucus membranes or skin necrosis: no Has patient had a PCN reaction that required hospitalization: no Has patient had a PCN reaction occurring within the last 10 years: no If all of the above answers are "NO", then may proceed with Cephalosporin use.     Antimicrobials:  Previous admit: 11/28 gent >> 11/28 clinda >> 11/29 Keflex >>   This admit:  12/4 Vancomycin >>  Dose adjustments this admission:  ---  Microbiology results:  Previous  admit: 11/28 BCx: ngtd 11/28 UCx: >100k CoNS  11/29 MRSA PCR: neg  This admit: none  Thank you for allowing pharmacy to be a part of this patient's care.  Peggyann Juba, PharmD, BCPS Pager: [phone] 12/22/2015 1:43 PM

## 2015-12-22 NOTE — H&P (Signed)
Office Visit Report     11/26/2015   --------------------------------------------------------------------------------   [name]  MRN: [id number]  PRIMARY CARE:  Bryn Gulling. Dayton Martes, MD  DOB: September 10, 1933, 82 year old Male  REFERRING:  Lavan Imes I. Patsi Sears, MD  SSN:   PROVIDER:  Jethro Bolus, M.D.    LOCATION:  [facility] [phone]   --------------------------------------------------------------------------------   CC: BPH  HPI: [name] is a 82 year-old male established patient who is here for follow up regarding further evaluation of BPH and lower urinary tract symptoms.  The patient complains of lower urinary tract symptom(s) that include frequency, urgency, dysuria, weak stream, intermittency, nocturia, and sense of incomplete emptying. The patient states his most bothersome symptom(s) are the following: urgency. Patient is currently treated with Tamsulosin for his symptoms. His symptoms have been worse over the last year. The patient states if he were to spend the rest of his life with his current urinary condition, he would be unhappy. He complains of other associated symptom(s). He has previously tried flomax for his symptoms.   Previously tried DDADP, but caused lip swelling, and med was stopped. Na 142.   He is s/p prostate biopsy on 01/26/08 which was negative pathology.     AUA Symptom Score: Less than 20% of the time he has the sensation of not emptying his bladder completely when finished urinating. Almost always he has to urinate again fewer than two hours after he has finished urinating. Less than 50% of the time he has to start and stop again several times when he urinates. More than 50% of the time he finds it difficult to postpone urination. 50% of the time he has a weak urinary stream. 50% of the time he has to push or strain to begin urination. He has to get up to urinate 5 or more times from the time he goes to bed until the time he gets up  in the morning.   Calculated AUA Symptom Score: 23    ALLERGIES: DDAVP Doxycycline Hyclate CAPS Flomax CP24 Septra TABS    MEDICATIONS: Tamsulosin Hcl 0.4 mg capsule, ext release 24 hr TAKE ONE CAPSULE BY MOUTH EVERY NIGHT AT BEDTIME  Aspirin 81 MG TABS Oral  CoQ10 CAPS Oral  Fish Oil CAPS Oral  Flonase Allergy Relief  Nitrostat 0.4 MG Sublingual Tablet Sublingual Sublingual  Oxycontin  Tesslon Pearls  Vitamin B-12 1000 MCG/ML SOLN Injection     GU PSH: Complex cystometrogram, w/ void pressure and urethral pressure profile studies, any technique - 11/12/2015 Complex Uroflow - 11/12/2015 Emg surf Electrd - 11/12/2015 Hernia Repair - 2010 Inject For cystogram - 11/12/2015 Intrabd voidng Press - 11/12/2015 Prostate Needle Biopsy - 2009    NON-GU PSH: Bone Marrow Biopsy - 2011 Cataract Surgery, Bilateral - 09/02/2015 Revise Knee Joint - 2011 Total Hip Replacement - 2011    GU PMH: BPH w/LUTS - 10/21/2015, Benign localized hyperplasia of prostate with urinary obstruction, - 08/12/2014, Benign prostatic hyperplasia with urinary obstruction, - 2014 Nocturia (Stable) - 10/21/2015, Nocturia, - 2015 Urinary Frequency - 10/21/2015 Elevated PSA, Elevated prostate specific antigen (PSA) - 08/12/2014 Nodular prostate w/o LUTS, Nodular prostate without lower urinary tract symptoms - 2014 Orchitis/epididymit, Orchitis With Epididymitis - 2014 Personal Hx Oth male genital organs diseases, History of prostatitis - 2014 Testicular pain, unspecified, Testicular pain - 2014      PMH Notes:  2009-08-13 14:37:04 - Note: Pernicious Anemia  2006-07-26 09:05:01 - Note: Arthritis   NON-GU PMH: Encounter for  general adult medical examination without abnormal findings, Encounter for preventive health examination - 08/12/2014 Muscle wasting and atrophy, not elsewhere classified, multiple sites, Disuse muscle atrophy - 2014 Other lack of coordination, Other lack of coordination - 2014 Other specified  diseases of anus and rectum, Rectal pain - 2014 Personal history of other diseases of the circulatory system, History of cardiac disorder - 2014 Personal history of other diseases of the nervous system and sense organs, History of sleep apnea - 2014 Personal history of transient ischemic attack (TIA), and cerebral infarction without residual deficits, History of transient cerebral ischemia - 2014 Unspecified atrial fibrillation, Atrial Fibrillation - 2014 Vitamin D deficiency, unspecified, Vitamin D deficiency - 2014    FAMILY HISTORY: Colon Cancer - Father   SOCIAL HISTORY: Marital Status: Married Current Smoking Status: Patient does not smoke anymore.  Has never drank.  Does not drink caffeine. Patient's occupation is/was retired.    REVIEW OF SYSTEMS:    GU Review Male:   Patient reports hard to postpone urination, get up at night to urinate, and leakage of urine. Patient denies frequent urination, burning/ pain with urination, stream starts and stops, trouble starting your stream, have to strain to urinate , erection problems, and penile pain.  Gastrointestinal (Upper):   Patient denies nausea, vomiting, and indigestion/ heartburn.  Gastrointestinal (Lower):   Patient denies diarrhea and constipation.  Constitutional:   Patient denies fever, night sweats, weight loss, and fatigue.  Skin:   Patient denies skin rash/ lesion and itching.  Eyes:   Patient denies blurred vision and double vision.  Ears/ Nose/ Throat:   Patient denies sore throat and sinus problems.  Hematologic/Lymphatic:   Patient denies swollen glands and easy bruising.  Cardiovascular:   Patient denies leg swelling and chest pains.  Respiratory:   Patient denies cough and shortness of breath.  Endocrine:   Patient denies excessive thirst.  Musculoskeletal:   Patient reports joint pain. Patient denies back pain.  Neurological:   Patient denies headaches and dizziness.  Psychologic:   Patient reports anxiety. Patient  denies depression.   VITAL SIGNS:      11/26/2015 02:48 PM  BP 133/70 mmHg  Pulse 65 /min  Temperature 98.0 F / 37 C   GU PHYSICAL EXAMINATION:    Anus and Perineum: No hemorrhoids. No anal stenosis. No rectal fissure, no anal fissure. No edema, no dimple, no perineal tenderness, no anal tenderness.  Scrotum: No lesions. No edema. No cysts. No warts.  Epididymides: Right: no spermatocele, no masses, no cysts, no tenderness, no induration, no enlargement. Left: no spermatocele, no masses, no cysts, no tenderness, no induration, no enlargement.  Testes: No tenderness, no swelling, no enlargement left testes. No tenderness, no swelling, no enlargement right testes. Normal location left testes. Normal location right testes. No mass, no cyst, no varicocele, no hydrocele left testes. No mass, no cyst, no varicocele, no hydrocele right testes.  Urethral Meatus: Normal size. No lesion, no wart, no discharge, no polyp. Normal location.  Penis: Circumcised, no warts, no cracks. No dorsal Peyronie's plaques, no left corporal Peyronie's plaques, no right corporal Peyronie's plaques, no scarring, no warts. No balanitis, no meatal stenosis.  Prostate: Prostate 4 + size. Left lobe normal consistency, right lobe normal consistency. Symmetrical lobes. No prostate nodule. Left lobe no tenderness, right lobe no tenderness.   Seminal Vesicles: Nonpalpable.  Sphincter Tone: Normal sphincter. No rectal tenderness. No rectal mass.    MULTI-SYSTEM PHYSICAL EXAMINATION:    Constitutional: Well-nourished. No physical  deformities. Normally developed. Good grooming.  Neck: Neck symmetrical, not swollen. Normal tracheal position.  Respiratory: No labored breathing, no use of accessory muscles.   Cardiovascular: Normal temperature, normal extremity pulses, no swelling, no varicosities.  Lymphatic: No enlargement of neck, axillae, groin.  Skin: No paleness, no jaundice, no cyanosis. No lesion, no ulcer, no rash.   Neurologic / Psychiatric: Oriented to time, oriented to place, oriented to person. No depression, no anxiety, no agitation.  Gastrointestinal: No mass, no tenderness, no rigidity, non obese abdomen.  Eyes: Normal conjunctivae. Normal eyelids.  Ears, Nose, Mouth, and Throat: Left ear no scars, no lesions, no masses. Right ear no scars, no lesions, no masses. Nose no scars, no lesions, no masses. Normal hearing. Normal lips.  Musculoskeletal: Normal gait and station of head and neck.     PAST DATA REVIEWED:  Source Of History:  Patient  Records Review:   Previous Patient Records  Urodynamics Review:   Review Urodynamics Tests   08/13/14 04/06/11 10/15/09 09/02/09 08/06/09 12/28/07 12/12/07 03/23/07  PSA  Total PSA 5.19  4.92  4.10  6.65  7.20  4.60  4.25  3.16   Free PSA 1.00  0.75  0.65  0.72  0.84  0.64  0.73    % Free PSA 19  15  15.9  10.8  11.7  13.9  17.2      08/08/02  Hormones  Testosterone, Total 3.71     11/26/15  Urinalysis  Urine Appearance Clear   Urine Color Yellow   Urine Glucose Neg   Urine Bilirubin Neg   Urine Ketones Neg   Urine Specific Gravity 1.020   Urine Blood Neg   Urine pH <=5.0   Urine Protein Neg   Urine Urobilinogen 0.2   Urine Nitrites Neg   Urine Leukocyte Esterase Neg    PROCEDURES:         Flexible Cystoscopy - 52000  Risks, benefits, and some of the potential complications of the procedure were discussed at length with the patient including infection, bleeding, voiding discomfort, urinary retention, fever, chills, sepsis, and others. All questions were answered. Informed consent was obtained. Antibiotic prophylaxis was given. Sterile technique and intraurethral analgesia were used.  Given Cipro as prophylactic.  Meatus:  Normal size. Normal location. Normal condition.  Urethra:  No strictures.  External Sphincter:  Normal.  Verumontanum:  Normal.  Prostate:  Non-obstructing. No hyperplasia.  Bladder Neck:  Non-obstructing.  Ureteral  Orifices:  Normal location. Normal size. Normal shape. Effluxed clear urine.  Bladder:  No trabeculation. No tumors. Normal mucosa. No stones.      The lower urinary tract was carefully examined. The procedure was well-tolerated and without complications. Antibiotic instructions were given. Instructions were given to call the office immediately for bloody urine, difficulty urinating, urinary retention, painful or frequent urination, fever, chills, nausea, vomiting or other illness. The patient stated that he understood these instructions and would comply with them.         Urinalysis Dipstick Dipstick Cont'd  Color: Yellow Bilirubin: Neg  Appearance: Clear Ketones: Neg  Specific Gravity: 1.020 Blood: Neg  pH: <=5.0 Protein: Neg  Glucose: Neg Urobilinogen: 0.2    Nitrites: Neg    Leukocyte Esterase: Neg    ASSESSMENT:      ICD-10 Details  1 GU:   BPH w/LUTS - N40.1           Notes:   Marcella is an 82 year old male, complaining of urinary frequency, urgency, dysuria, weakened stream,  intermittency, urinary straining nocturia, and a sense of incomplete emptying. He is currently being treated with Tamsulosin for his symptoms. He has unhappy to spend the rest of his life with this current urinary condition. He has been tried on DDAVP in the past, but this causes lip swelling and was stopped. He is status post prostate biopsy in 2010 which was negative.   Urodynamics shows a maximum capacity of 290 cc. The first sensation of fell is a 1 87 cc with normal desire at 225 cc and strong desire at 247 cc. There is an unstable bladder contraction pressure of 11 cm of water, but no urinary leakage occurs because the patient is able to inhibit this unstable contraction.   The patient did not leak for leak point pressure determination.   Pressure flow study was accomplished, a maximum flow rate was 8 cc/s with detrusor pressure at maximum flow of 45 cm of water pressure. The maximum detrusor pressure is  55 cm of water pressure. Post void residual is minimal. Fluoroscopy shows no evidence of bladder stone, and there is no reflux. There is elevation of the bladder base.   Urodynamics shows a small capacity bladder of 290 cc, with mild instability. The patient does have a sustained detrusor contraction. He voids with an obstructive flow pattern. There is elevation of the bladder base. There is no reflux.  Cystoscopy shows normal veru, with bilateral enlarged lateral lobes. He has normal bladder neck, and normal trigone. Mild trabeculation, without cellules or diverticula.  We have discussed his symptomatology, with urodynamic and cystoscopic evidence of obstruction, and have elected to proceed with Thulium laser prostatectomy as op. He will need foley cath for 3-5 days post op. His daughter will bring him. ( lives 1 mile away).     PLAN:           Schedule Return Visit: Next Available Appointment - Schedule Surgery  Return Visit: Next Available Appointment - Schedule Surgery          Document Letter(s):  Created for Patient: Clinical Summary    Signed by Jethro Bolus, M.D. on 11/26/15 at 3:25 PM (EST)     The information contained in this medical record document is considered private and confidential patient information. This information can only be used for the medical diagnosis and/or medical services that are being provided by the patient's selected caregivers. This information can only be distributed outside of the patient's care if the patient agrees and signs waivers of authorization for this information to be sent to an outside source or route.

## 2015-12-22 NOTE — Transfer of Care (Signed)
Immediate Anesthesia Transfer of Care Note  Patient: [name]  Procedure(s) Performed: Procedure(s): THULIUM LASER TURP (TRANSURETHRAL RESECTION OF PROSTATE) (N/A) CYSTOSCOPY WITH LEFT RETROGRADE  URETEROSCOPY AND STENT PLACEMENT (Left) HOLMIUM LASER APPLICATION (Left)  Patient Location: PACU  Anesthesia Type:General  Level of Consciousness: awake, alert , oriented and patient cooperative  Airway & Oxygen Therapy: Patient Spontanous Breathing and Patient connected to nasal cannula oxygen  Post-op Assessment: Report given to RN and Post -op Vital signs reviewed and stable  Post vital signs: Reviewed and stable  Last Vitals:  Vitals:   12/22/15 0752  BP: 120/71  Pulse: 93  Resp: 18  Temp: 36.5 C    Last Pain:  Vitals:   12/22/15 0752  TempSrc: Oral      Patients Stated Pain Goal: 5 ([id number])  Complications: No apparent anesthesia complications

## 2015-12-22 NOTE — Interval H&P Note (Signed)
History and Physical Interval Note:  12/22/2015 9:06 AM  [name]  has presented today for surgery, with the diagnosis of BENIGN PROSTATIC HYPERPLASIA  The various methods of treatment have been discussed with the patient and family. After consideration of risks, benefits and other options for treatment, the patient has consented to  Procedure(s): THULIUM LASER TURP (TRANSURETHRAL RESECTION OF PROSTATE) (N/A) CYSTOSCOPY WITH LEFT RETROGRADE  URETEROSCOPY AND STENT PLACEMENT (Left) HOLMIUM LASER APPLICATION (Left) as a surgical intervention .  The patient's history has been reviewed, patient examined, no change in status, stable for surgery.  I have reviewed the patient's chart and labs.  Questions were answered to the patient's satisfaction.     Fajr Fife I Cyndal Kasson  Mr. Armelin is for Thulium laser prostatectomy this AM. However, He was admitted last week for UTI and ureteral stone ( Left), and JJ stent placement. He will have Vancomycin IV this AM ( per C/S urine); and ureteroscopy , laser stone, JJ stent, and laser of prostate. Will still plan as op if possible. Pt marked.

## 2015-12-22 NOTE — Anesthesia Preprocedure Evaluation (Addendum)
Anesthesia Evaluation  Patient identified by MRN, date of birth, ID band Patient awake    Reviewed: Allergy & Precautions, NPO status , Patient's Chart, lab work & pertinent test results  Airway Mallampati: II  TM Distance: >3 FB     Dental  (+) Dental Advisory Given, Teeth Intact, Chipped,    Pulmonary sleep apnea , former smoker,    breath sounds clear to auscultation       Cardiovascular hypertension, Pt. on medications + CAD   Rhythm:Regular Rate:Normal     Neuro/Psych Depression    GI/Hepatic Neg liver ROS, hiatal hernia,   Endo/Other    Renal/GU Renal diseasenegative Renal ROSTURP Left renal calculus     Musculoskeletal  (+) Arthritis , Osteoarthritis,    Abdominal   Peds  Hematology  (+) Blood dyscrasia, anemia , Thrombocytopenia PLTS 53K on 12-18-15   Anesthesia Other Findings   Reproductive/Obstetrics                       Anesthesia Physical Anesthesia Plan  ASA: III  Anesthesia Plan: General   Post-op Pain Management:    Induction: Intravenous  Airway Management Planned: LMA  Additional Equipment:   Intra-op Plan:   Post-operative Plan: Extubation in OR  Informed Consent: I have reviewed the patients History and Physical, chart, labs and discussed the procedure including the risks, benefits and alternatives for the proposed anesthesia with the patient or authorized representative who has indicated his/her understanding and acceptance.   Dental advisory given  Plan Discussed with: CRNA and Anesthesiologist  Anesthesia Plan Comments:         Anesthesia Quick Evaluation

## 2015-12-22 NOTE — Telephone Encounter (Signed)
"  My dad just saw Dr. Gaynelle Arabian to plan for surgery.  We were told to contact Dr.  Beryle Beams to be seen as soon as possible.  He has to have laser surgery for stone removal."  Patient seen last at [facility] on 01-16-2013. Provided [facility] number and no futher questions.

## 2015-12-22 NOTE — Op Note (Signed)
Preoperative Diagnosis: BPH, left UPJ stone  Postoperative Diagnosis:  Same  Procedure(s) Performed:   1. Cystourethroscopy 2. Left ureteral stent removal 3. Left ureteroscopic stone extraction with laser lithotripsy 4. Left ureteral stent placement, 25fr x 24cm double J type, without string 5. Thulium laser transurethral resection of prostate 6. Intraoperative fluoroscopy with interpretation <1hr  Surgeons:   Primary: Carolan Clines, MD Resident - Assisting: Rosamaria Lints, MD   Anesthesia:  General via endotracheal tube Anesthesiologist: Finis Bud, MD CRNA: Justice Rocher, CRNA; Wanita Chamberlain, CRNA   Fluids:  See anesthesia record  Estimated blood loss:  <20cc  Specimens:  Stone for analysis  Cultures:  None  Drains:  3-way Foley cath, 30cc in balloon, initially to traction  Implant(s): 34fr x 24cm double J type ureteral stent without string attached  Complications:  None  Indications: 82 y.o. year-old male with history of BPH and recent left UPJ stone with UTI s/p stent placement. He was previously seen in clinic where he provided written informed consent for the procedure after discussion of the risks, benefits, and alternatives. All questions answered. He is eager to proceed.  Findings:  Prior ureteral stent in good position. Small perforation of distal ureter with wire. Small shear injury of left UPJ from sheath. Approx 22mm left lower pole stone seen and treated with lithotripsy. All fragments >27mm were removed. Successful replacement of stent within the true lumen. No ureteral stones. BPH with primarily bilobar hypertrophy, only small median lobe. Successful Thulium laser TURP. Successful foley cath placement.   Description:  The patient was correctly identified in the preop holding area where written informed consent as well potential risk and complication reviewed. He agreed. They were brought back to the operative suite where a preinduction timeout was  performed. Once correct information was verified, anesthesia was induced  via endotracheal tube. They were then gently placed into dorsal lithotomy position with SCDs in place for VTE prophylaxis. They were prepped and draped in the usual sterile fashion and given appropriate preoperative antibiotics with Vancomycin. A second timeout was then performed.   We inserted a 25F rigid cystoscope per urethra with copious lubrication and normal saline irrigation running. This demonstrated findings as described above. We brought the end of the ureteral stent to the urethral meatus. We cannulated this with a Sensor wire. Initially during wire advancement, there was notably a small perforation of the distal ureter. This was pinpoint and only advanced a couple cm outside of the true lumen. It was corrected and advanced up to the kidney. We then sequentially dilated with our ureteral access sheath before advancing this up to the UPJ. We introduced our ureteroscope. As we surveyed the UPJ and kidney, it was apparent there was a small shear injury to the mucosa of the UPJ. It was not full thickness and not circumferential. We were able to easily advance into the kidney. We isolated an approximate 19mm lower pole stone and used our laser to break the stone into fine pieces. We used a 0-tip basket to remove all pieces >76mm. We surveyed the kidney once more to confirm no remaining larger stones before we removed the sheath over the scope as we performed pullback ureteroscopy. No ureteral stones seen. We loaded the wire onto the cystoscope and advanced a 6 Fr x 24cm double J type ureteral stent without string attached up to the kidney under direct visual and fluoroscopic guidance. We removed the wire and the curls were noted in their appropriate position.  We then proceeded with Thulium laser TURP. We introduced the Thulium laser fiber through the cystoscope and began our resection. We took care to avoid ureteral orifices and to  limit our resection proximal to the verumontanum. There was a very small median lobe that was resected successfully. Once we had formed an open prostatic urethral channel, we again confirmed the limits of our resection. Hemostasis was achieved. We removed the scope and laser leaving the bladder full. We advanced our 3-way foley cath and placed 30cc in the balloon. The catheter was placed to CBI and traction. The patient was awoken from anesthesia and returned to PACU in stable condition.   Post Op Plan:   1. Admit to urology for post-op management.  2. Foley traction off in 4 hours. CBI to keep urine pink.  3. Anticipate foley removal tomorrow and trial of void. 4. Ureteral stent to remain in place for 4 weeks, to be removed in office. 5. Continue antibiotics tailored to prior culture.   Attestation:  Dr. Gaynelle Arabian was present and scrubbed for the entirety of the procedure.    Joesph Fillers. Jonny Ruiz, [facility]

## 2015-12-22 NOTE — Anesthesia Postprocedure Evaluation (Signed)
Anesthesia Post Note  Patient: [name]  Procedure(s) Performed: Procedure(s) (LRB): THULIUM LASER TURP (TRANSURETHRAL RESECTION OF PROSTATE) (N/A) CYSTOSCOPY WITH LEFT RETROGRADE  URETEROSCOPY AND STENT PLACEMENT (Left) HOLMIUM LASER APPLICATION (Left)  Patient location during evaluation: PACU Anesthesia Type: General Level of consciousness: awake Pain management: pain level controlled Vital Signs Assessment: post-procedure vital signs reviewed and stable Respiratory status: spontaneous breathing Cardiovascular status: stable Anesthetic complications: no    Last Vitals:  Vitals:   12/22/15 1152 12/22/15 1230  BP: (!) 159/90 (!) 165/88  Pulse: 77 74  Resp: 13 13  Temp: 36.4 C     Last Pain:  Vitals:   12/22/15 1230  TempSrc:   PainSc: 5                  EDWARDS,Marcianna Daily

## 2015-12-22 NOTE — Telephone Encounter (Signed)
Ros - it looks like he had the surgery done today! He has chronic low platelets but above 50,000 so he should do OK. Let daughter know the surgery was already done so not urgent to see me at this point. If Dr Gaynelle Arabian needs any input from me, he can call me any time.

## 2015-12-22 NOTE — Anesthesia Procedure Notes (Signed)
Procedure Name: Intubation Date/Time: 12/22/2015 9:27 AM Performed by: Wanita Chamberlain Pre-anesthesia Checklist: Patient identified, Timeout performed, Emergency Drugs available, Suction available and Patient being monitored Patient Re-evaluated:Patient Re-evaluated prior to inductionOxygen Delivery Method: Circle system utilized Preoxygenation: Pre-oxygenation with 100% oxygen Intubation Type: IV induction Ventilation: Mask ventilation without difficulty Laryngoscope Size: Mac and 3 Grade View: Grade I Tube type: Oral Tube size: 8.0 mm Number of attempts: 1 Airway Equipment and Method: Stylet Placement Confirmation: breath sounds checked- equal and bilateral,  positive ETCO2 and ETT inserted through vocal cords under direct vision Secured at: 22 cm Tube secured with: Tape Dental Injury: Teeth and Oropharynx as per pre-operative assessment

## 2015-12-23 ENCOUNTER — Encounter (HOSPITAL_BASED_OUTPATIENT_CLINIC_OR_DEPARTMENT_OTHER): Payer: Self-pay | Admitting: Urology

## 2015-12-23 DIAGNOSIS — G4730 Sleep apnea, unspecified: Secondary | ICD-10-CM | POA: Diagnosis not present

## 2015-12-23 DIAGNOSIS — K449 Diaphragmatic hernia without obstruction or gangrene: Secondary | ICD-10-CM | POA: Diagnosis not present

## 2015-12-23 DIAGNOSIS — D696 Thrombocytopenia, unspecified: Secondary | ICD-10-CM | POA: Diagnosis not present

## 2015-12-23 DIAGNOSIS — N9971 Accidental puncture and laceration of a genitourinary system organ or structure during a genitourinary system procedure: Secondary | ICD-10-CM | POA: Diagnosis not present

## 2015-12-23 DIAGNOSIS — Z7982 Long term (current) use of aspirin: Secondary | ICD-10-CM | POA: Diagnosis not present

## 2015-12-23 DIAGNOSIS — N401 Enlarged prostate with lower urinary tract symptoms: Secondary | ICD-10-CM | POA: Diagnosis not present

## 2015-12-23 DIAGNOSIS — D649 Anemia, unspecified: Secondary | ICD-10-CM | POA: Diagnosis not present

## 2015-12-23 DIAGNOSIS — R350 Frequency of micturition: Secondary | ICD-10-CM | POA: Diagnosis not present

## 2015-12-23 DIAGNOSIS — I2510 Atherosclerotic heart disease of native coronary artery without angina pectoris: Secondary | ICD-10-CM | POA: Diagnosis not present

## 2015-12-23 DIAGNOSIS — Z888 Allergy status to other drugs, medicaments and biological substances status: Secondary | ICD-10-CM | POA: Diagnosis not present

## 2015-12-23 DIAGNOSIS — E785 Hyperlipidemia, unspecified: Secondary | ICD-10-CM | POA: Diagnosis not present

## 2015-12-23 DIAGNOSIS — R338 Other retention of urine: Secondary | ICD-10-CM | POA: Diagnosis not present

## 2015-12-23 DIAGNOSIS — M1990 Unspecified osteoarthritis, unspecified site: Secondary | ICD-10-CM | POA: Diagnosis not present

## 2015-12-23 DIAGNOSIS — R300 Dysuria: Secondary | ICD-10-CM | POA: Diagnosis not present

## 2015-12-23 DIAGNOSIS — Z881 Allergy status to other antibiotic agents status: Secondary | ICD-10-CM | POA: Diagnosis not present

## 2015-12-23 DIAGNOSIS — R3912 Poor urinary stream: Secondary | ICD-10-CM | POA: Diagnosis not present

## 2015-12-23 DIAGNOSIS — N201 Calculus of ureter: Secondary | ICD-10-CM | POA: Diagnosis not present

## 2015-12-23 DIAGNOSIS — Z800 Family history of malignant neoplasm of digestive organs: Secondary | ICD-10-CM | POA: Diagnosis not present

## 2015-12-23 DIAGNOSIS — Z87891 Personal history of nicotine dependence: Secondary | ICD-10-CM | POA: Diagnosis not present

## 2015-12-23 DIAGNOSIS — I110 Hypertensive heart disease with heart failure: Secondary | ICD-10-CM | POA: Diagnosis not present

## 2015-12-23 DIAGNOSIS — I509 Heart failure, unspecified: Secondary | ICD-10-CM | POA: Diagnosis not present

## 2015-12-23 DIAGNOSIS — R3915 Urgency of urination: Secondary | ICD-10-CM | POA: Diagnosis not present

## 2015-12-23 LAB — BASIC METABOLIC PANEL
Anion gap: 5 (ref 5–15)
BUN: 11 mg/dL (ref 6–20)
CALCIUM: 7.9 mg/dL — AB (ref 8.9–10.3)
CHLORIDE: 104 mmol/L (ref 101–111)
CO2: 26 mmol/L (ref 22–32)
CREATININE: 1.17 mg/dL (ref 0.61–1.24)
GFR, EST NON AFRICAN AMERICAN: 57 mL/min — AB (ref >60)
Glucose, Bld: 179 mg/dL — ABNORMAL HIGH (ref 65–99)
Potassium: 3.8 mmol/L (ref 3.5–5.1)
SODIUM: 135 mmol/L (ref 135–145)

## 2015-12-23 LAB — HEMOGLOBIN AND HEMATOCRIT, BLOOD
HEMATOCRIT: 37.6 % — AB (ref 39.0–52.0)
HEMOGLOBIN: 12.6 g/dL — AB (ref 13.0–17.0)

## 2015-12-23 MED ORDER — NITROFURANTOIN MONOHYD MACRO 100 MG PO CAPS: 100.0000 mg | ORAL_CAPSULE | Freq: Two times a day (BID) | ORAL | 0 refills | Status: AC

## 2015-12-23 MED ORDER — HYDROCODONE-ACETAMINOPHEN 5-325 MG PO TABS: 1.0000 | ORAL_TABLET | ORAL | 0 refills | Status: DC | PRN

## 2015-12-23 MED ORDER — ASPIRIN EC 81 MG PO TBEC: 81.0000 mg | DELAYED_RELEASE_TABLET | Freq: Every day | ORAL | Status: DC

## 2015-12-23 NOTE — Consult Note (Signed)
   [facility] CM Inpatient Consult   12/23/2015  [name] [birth year] [id number]    Patient screened for [place] Management services. Went to bedside to offer and explain [facility] Care Management program with [name] and daughter [name]. Both patient and daughter declined [place] Management follow up. [name] states she lives near her father and takes care of everything for him. States she does not think they need any follow up calls or home visits.  Accepted [facility] Care Management brochure with contact information to call in future if [place] Management needed. Will make inpatient RNCM aware that [name] declined [place] Management program services.  Marthenia Rolling, MSN-Ed, RN,BSN [facility] Liaison [phone]

## 2015-12-23 NOTE — Telephone Encounter (Signed)
Called daughter Lemmie Evens.  Message left on mobile  voicemail that Dr. Gaynelle Arabian can call Dr.Granfortuna at anytime.  At this time, Dad's with no urgent needs.

## 2015-12-23 NOTE — Discharge Summary (Signed)
Date of admission: 12/22/2015  Date of discharge: 12/23/2015  Admission diagnosis: BPH, left ureteral stone  Discharge diagnosis: Same  Secondary diagnoses: CAD, CHF, HTN, HLD, diverticulosis, choledocholithiasis, pancreatitis  History and Physical: For full details, please see admission history and physical. Briefly, [name] is a 82 y.o. male with BPH with LUTS and a left UPJ stone. A stent was placed previously for concern with concomitant UTI. After discussing management/treatment options, he  elected to proceed with surgical treatment.  Hospital Course: KARTER HELLMER was taken to the operating room on 12/22/2015 and underwent a left ureteroscopic stone extraction and stent exchange as well as a Thulium laser TURP. He tolerated this procedure well and without complications. Postoperatively, the patient was able to be transferred to a regular hospital room following recovery from anesthesia.  They were able to begin ambulating the night of surgery and remained hemodynamically stable overnight.  On POD#1, his foley was clear on minimal CBI and it was removed. He initially voided 100cc but had difficulty continuing to void. A foley cath was replaced prior to discharge.  He was transitioned to oral pain medication, tolerated a regular diet, and had met all discharge criteria and was able to be discharged home on POD#1.   Laboratory values:   Recent Labs  12/23/15 0428  HGB 12.6*  HCT 37.6*    Disposition: Home  Discharge instruction: They were instructed to take antibiotics as prescribed. Take pain medication with stool softeners. To restart Aspirin in 1 week  Discharge medications:     Medication List    TAKE these medications   aspirin EC 81 MG tablet Take 1 tablet (81 mg total) by mouth daily. Start taking on:  12/26/2015   HYDROcodone-acetaminophen 5-325 MG tablet Commonly known as:  NORCO/VICODIN Take 1 tablet by mouth every 4 (four) hours as needed for moderate  pain.   lidocaine 2 % solution Commonly known as:  XYLOCAINE   metoprolol succinate 25 MG 24 hr tablet Commonly known as:  TOPROL-XL Take 25 mg by mouth daily.   nitrofurantoin (macrocrystal-monohydrate) 100 MG capsule Commonly known as:  MACROBID Take 1 capsule (100 mg total) by mouth 2 (two) times daily.   tamsulosin 0.4 MG Caps capsule Commonly known as:  FLOMAX Take 0.4 mg by mouth daily after supper.   TEARS NATURALE OP Place 1-2 drops into both eyes at bedtime as needed (dry eyes).       Followup: He will follow-up on Monday for foley cath removal and then in 4 weeks for ureteral stent removal.

## 2015-12-23 NOTE — Progress Notes (Signed)
Urology Progress Note  1 Day Post-Op  Subjective: The patient is doing well.  No nausea or vomiting. Pain is adequately controlled.   Objective: Vital signs in last 24 hours: Temp:  [97.5 F (36.4 C)-98.6 F (37 C)] 97.5 F (36.4 C) (12/05 0517) Pulse Rate:  [73-93] 83 (12/05 0517) Resp:  [13-18] 18 (12/05 0517) BP: (109-176)/(55-95) 113/64 (12/05 0517) SpO2:  [96 %-100 %] 98 % (12/05 0517) Weight:  [61 kg (134 lb 8 oz)] 61 kg (134 lb 8 oz) (12/04 0752)  Intake/Output from previous day: 12/04 0701 - 12/05 0700 In: 7360 [P.O.:740; I.V.:2920; IV Piggyback:250] Out: [id number] [Urine:7450; Blood:25] Intake/Output this shift: No intake/output data recorded.  Physical Exam:  General: Alert and oriented. CV: RRR Lungs: Clear bilaterally. GI: Soft, Nondistended. Urine: Clear on slow drip CBI Extremities: Nontender, no erythema, no edema.  Lab Results:  Recent Labs  12/23/15 0428  HGB 12.6*  HCT 37.6*      Assessment/Plan: POD#1 s/p left ureteroscopic stone extraction and stent placement, Thulium laser TURP  1. Remove foley, trial of void 2. Anticipate discharge later today after trial of void 3. Will be discharged with pain medication and antibiotics 4. Ureteral stent to stay in place 4 weeks, to be removed in office   Obrien Huskins R 12/23/2015, 7:34 AM

## 2015-12-23 NOTE — Progress Notes (Signed)
Pt voided 100cc of clear pink urine with PVR of 150cc via bladder scan.

## 2015-12-23 NOTE — Progress Notes (Signed)
Per Dr. Gaynelle Arabian foley hand irrigated. Foley irrigated with quick return of irrigant and no clots present. Tisheena Maguire A

## 2015-12-23 NOTE — Discharge Instructions (Addendum)
Take antibiotics as prescribed. Take WITH food.  Restart your aspirin in 1 week. Call if you are having significant bleeding in the urine that is making it difficult to urinate. Call for fevers or worsening severe pain. Take pain medication if needed. Take with stool softeners.  Keep the foley catheter to drainage at all times, keeping it below the level of the bladder so it may drain by gravity. It is normal to see some blood in the urine, but if you are seeing bright red blood with clots or the catheter is not draining well, you will need to be evaluated.   You will come back to the office on Monday for catheter removal and then in 4 weeks for removal of the ureteral stent. Call our office to confirm appointments if we do not call you soon after discharge.

## 2015-12-24 ENCOUNTER — Ambulatory Visit: Payer: Medicare Other

## 2015-12-30 ENCOUNTER — Encounter: Payer: Self-pay | Admitting: Family Medicine

## 2015-12-30 ENCOUNTER — Ambulatory Visit (INDEPENDENT_AMBULATORY_CARE_PROVIDER_SITE_OTHER): Payer: Medicare Other | Admitting: Family Medicine

## 2015-12-30 VITALS — BP 112/62 | HR 91 | Temp 98.1°F | Wt 137.2 lb

## 2015-12-30 DIAGNOSIS — N200 Calculus of kidney: Secondary | ICD-10-CM | POA: Diagnosis not present

## 2015-12-30 DIAGNOSIS — R05 Cough: Secondary | ICD-10-CM

## 2015-12-30 DIAGNOSIS — N201 Calculus of ureter: Secondary | ICD-10-CM

## 2015-12-30 DIAGNOSIS — Z23 Encounter for immunization: Secondary | ICD-10-CM | POA: Diagnosis not present

## 2015-12-30 DIAGNOSIS — E538 Deficiency of other specified B group vitamins: Secondary | ICD-10-CM | POA: Diagnosis not present

## 2015-12-30 DIAGNOSIS — D696 Thrombocytopenia, unspecified: Secondary | ICD-10-CM

## 2015-12-30 DIAGNOSIS — R059 Cough, unspecified: Secondary | ICD-10-CM

## 2015-12-30 MED ORDER — ALBUTEROL SULFATE HFA 108 (90 BASE) MCG/ACT IN AERS: 2.0000 | INHALATION_SPRAY | Freq: Four times a day (QID) | RESPIRATORY_TRACT | 0 refills | Status: DC | PRN

## 2015-12-30 MED ORDER — CYANOCOBALAMIN 1000 MCG/ML IJ SOLN
1000.0000 ug | Freq: Once | INTRAMUSCULAR | Status: AC
  Administered 2015-12-30: 1000 ug via INTRAMUSCULAR

## 2015-12-30 NOTE — Progress Notes (Signed)
Subjective:   Patient ID: [name], male    DOB: [birth year], 82 y.o.   MRN: [id number]  [name] is a pleasant 82 y.o. year old male who presents to clinic today with Hospitalization Follow-up (kidney stone) and congestion in chest  on 12/30/2015  HPI:  Admitted to the hospital 12/22/15- 12/23/15 for nausea and found to have a large kidney stone.   Notes reviewed. Underwent left ureteroscopic stone extraction and stent exchange along with laster TURP on 12/22/15.  Now having some chest congestion.  No fevers. Difficulty urinating has resolved.  CBC was again low so was told to not restart ASA.  He is therefore not taking it.   Current Outpatient Prescriptions on File Prior to Visit  Medication Sig Dispense Refill  . Artificial Tear Solution (TEARS NATURALE OP) Place 1-2 drops into both eyes at bedtime as needed (dry eyes).    Marland Kitchen aspirin EC 81 MG tablet Take 1 tablet (81 mg total) by mouth daily.    Marland Kitchen HYDROcodone-acetaminophen (NORCO/VICODIN) 5-325 MG tablet Take 1 tablet by mouth every 4 (four) hours as needed for moderate pain. 21 tablet 0  . lidocaine (XYLOCAINE) 2 % solution     . metoprolol succinate (TOPROL-XL) 25 MG 24 hr tablet Take 25 mg by mouth daily.     . tamsulosin (FLOMAX) 0.4 MG CAPS capsule Take 0.4 mg by mouth daily after supper.      No current facility-administered medications on file prior to visit.     Allergies  Allergen Reactions  . Penicillins Hives and Other (See Comments)    [place]helps, passed out Tolerates cephalosporins  Has patient had a PCN reaction causing immediate rash, facial/tongue/throat swelling, SOB or lightheadedness with hypotension:  yes Has patient had a PCN reaction causing severe rash involving mucus membranes or skin necrosis: no Has patient had a PCN reaction that required hospitalization: no Has patient had a PCN reaction occurring within the last 10 years: no If all of the above answers are "NO", then may proceed with  Cephalosporin use.     Past Medical History:  Diagnosis Date  . Arthralgia of multiple joints    limited mobility w/  independant adl's  . BPH with obstruction/lower urinary tract symptoms   . Chronic idiopathic monocytosis    followed by dr Waymon Budge--  per lov note 06/ 2017 persistant unexplained  with normal bone barrow bx  . Coronary atherosclerosis of unspecified type of vessel, native or graft    cardiologist-  dr Stanford Breed -- per lov note 11-19-2014 ,  cardiac cath in 2000-- pLAD 60-70%,  small intermediate branch 70-80%,  mRCA 20%,  normal LV  . Degenerative arthritis of spine    cervical and lumar  . Diverticulosis of colon (without mention of hemorrhage)   . Dysuria    chronic  . Elevated PSA   . Feeling of incomplete bladder emptying   . Hiatal hernia    moderate per ct 11/ 2017  . History of adenomatous polyp of colon    2008  . History of atrial fibrillation    remote hx episode  . History of pancreatitis    07-01-2013  and 10-10-2015  . History of prostatitis    2014  . Hyperlipidemia   . Hypertension   . Mouth sore    roof of mouth sore   . Nephrolithiasis   . Nocturia more than twice per night    severe  w/ leakage  . OSA (obstructive sleep  apnea)    INTOLERANT CPAP  . Osteoarthritis   . Pernicious anemia    B12  Def.  . Peyronie disease   . Renal insufficiency   . Thrombocytopenia, unspecified hemotology/oncologist-  dr Waymon Budge   per dr Velta Addison note 06/ 2016  secondary to vitro clumping    Past Surgical History:  Procedure Laterality Date  . BONE MARROW BIOPSY  2011   normal  . CARDIAC CATHETERIZATION  2000   per dr Kathyrn Drown note --  dLAD 60-70%,  small intermediate branch 70-80%,  mRCA 20%,  normal LV  . CARDIOVASCULAR STRESS TEST  01-29-2013   dr Stanford Breed   normal nuclear study w/ no ischemia,  normal LV function and wall motion , ef 82%  . CATARACT EXTRACTION W/ INTRAOCULAR LENS  IMPLANT, BILATERAL  08/2015  .  CHOLECYSTECTOMY  11/24/2010   Procedure: LAPAROSCOPIC CHOLECYSTECTOMY WITH INTRAOPERATIVE CHOLANGIOGRAM;  Surgeon: Earnstine Regal, MD;  Location: [facility];  Service: General;  Laterality: N/A;  c-arm  . CYSTOSCOPY W/ URETERAL STENT PLACEMENT Left 12/17/2015   Procedure: CYSTOSCOPY WITH RETROGRADE PYELOGRAM/URETERAL STENT PLACEMENT;  Surgeon: Ardis Hughs, MD;  Location: [facility];  Service: Urology;  Laterality: Left;  . CYSTOSCOPY WITH RETROGRADE PYELOGRAM, URETEROSCOPY AND STENT PLACEMENT Left 12/22/2015   Procedure: CYSTOSCOPY WITH LEFT RETROGRADE  URETEROSCOPY AND STENT PLACEMENT;  Surgeon: Carolan Clines, MD;  [facility];  Service: Urology;  Laterality: Left;  . ERCP N/A 07/04/2013   Procedure: ENDOSCOPIC RETROGRADE CHOLANGIOPANCREATOGRAPHY (ERCP);  Surgeon: Gatha Mayer, MD;  Location: [facility];  Service: Endoscopy;  Laterality: N/A;  MAC if available  . ERCP N/A 10/12/2015   Procedure: ENDOSCOPIC RETROGRADE CHOLANGIOPANCREATOGRAPHY (ERCP);  Surgeon: [name], MD;  Location: [facility];  Service: Endoscopy;  Laterality: N/A;  . HOLMIUM LASER APPLICATION Left 89/01/6943   Procedure: HOLMIUM LASER APPLICATION;  Surgeon: Carolan Clines, MD;  Location: [facility];  Service: Urology;  Laterality: Left;  . INGUINAL HERNIA REPAIR Right 01/25/2002  . KNEE ARTHROSCOPY  x5  . SATURATION BIOPSY OF PROSTATE  05-29-2007  and 01-26-2008  . SHOULDER ARTHROSCOPY WITH OPEN ROTATOR CUFF REPAIR AND DISTAL CLAVICLE ACROMINECTOMY Right 11/19/2004  . TOTAL HIP ARTHROPLASTY Right 05/21/2009  . TOTAL KNEE ARTHROPLASTY Bilateral left 05-02-2003/  right  03-23-2010  . TRANSTHORACIC ECHOCARDIOGRAM  12/23/2004   ef 60%, mild MV calcification without stenosis/  mild TR,  PASP 21mHg  . UVULOPALATOPHARYNGOPLASTY  1993    w/  T & A    Family History  Problem Relation Age of Onset  . Colon cancer Father 458 . Peripheral vascular disease Mother     Social  History   Social History  . Marital status: Widowed    Spouse name: N/A  . Number of children: 2  . Years of education: N/A   Occupational History  . retired Retired   Social History Main Topics  . Smoking status: Former Smoker    Types: Pipe    Quit date: 01/18/1966  . Smokeless tobacco: Never Used  . Alcohol use No  . Drug use: No  . Sexual activity: Not on file   Other Topics Concern  . Not on file   Social History Narrative   DNR   Widowed   2 daughters leave near by   The PMH, PSH, Social History, Family History, Medications, and allergies [facility] and have been updated if relevant.    Review of Systems  Constitutional:  Positive for fatigue. Negative for fever.  HENT: Negative.   Respiratory: Positive for cough and wheezing. Negative for shortness of breath.   Cardiovascular: Negative.   Gastrointestinal: Negative.   Genitourinary: Negative.   Musculoskeletal: Negative.   Skin: Negative.   Neurological: Negative.   Hematological: Negative.   Psychiatric/Behavioral: Negative.   All other systems reviewed and are negative.      Objective:    BP 112/62   Pulse 91   Temp 98.1 F (36.7 C) (Oral)   Wt 137 lb 4 oz (62.3 kg)   SpO2 97%   BMI 26.80 kg/m    Physical Exam  Constitutional: He is oriented to person, place, and time.  Cardiovascular: Normal rate.   Pulmonary/Chest: No respiratory distress. He has wheezes. He has no rales. He exhibits no tenderness.  Musculoskeletal: Normal range of motion.  Neurological: He is alert and oriented to person, place, and time. No cranial nerve deficit.  Skin: Skin is warm and dry.  Psychiatric: He has a normal mood and affect. His behavior is normal. Judgment and thought content normal.          Assessment & Plan:   Cough  Nephrolithiasis  Ureteral stone No Follow-up on file.

## 2015-12-30 NOTE — Patient Instructions (Signed)
Great to see you. Happy Holidays.   Use proair inhaler as needed for shortness of breath or wheezing.

## 2015-12-30 NOTE — Assessment & Plan Note (Signed)
Some wheezes on exam.  Advised to use incentive spirometer, proair inhaler as needed.  Just finished several abx, including cleocin. Call or return to clinic prn if these symptoms worsen or fail to improve as anticipated. The patient indicates understanding of these issues and agrees with the plan.

## 2015-12-30 NOTE — Assessment & Plan Note (Signed)
S/p stent, TURP. Currently asymptomatic, Has follow up with urology.

## 2015-12-30 NOTE — Assessment & Plan Note (Signed)
Continue to hold ASA . Repeat CBC in 4 weeks.

## 2016-01-01 ENCOUNTER — Ambulatory Visit: Payer: Medicare Other

## 2016-01-21 DIAGNOSIS — N201 Calculus of ureter: Secondary | ICD-10-CM | POA: Diagnosis not present

## 2016-02-18 ENCOUNTER — Ambulatory Visit (INDEPENDENT_AMBULATORY_CARE_PROVIDER_SITE_OTHER): Payer: Medicare Other | Admitting: *Deleted

## 2016-02-18 DIAGNOSIS — E538 Deficiency of other specified B group vitamins: Secondary | ICD-10-CM | POA: Diagnosis not present

## 2016-02-18 MED ORDER — CYANOCOBALAMIN 1000 MCG/ML IJ SOLN
1000.0000 ug | Freq: Once | INTRAMUSCULAR | Status: AC
  Administered 2016-02-18: 1000 ug via INTRAMUSCULAR

## 2016-02-25 ENCOUNTER — Other Ambulatory Visit: Payer: Self-pay

## 2016-02-25 MED ORDER — METOPROLOL SUCCINATE ER 25 MG PO TB24: 25.0000 mg | ORAL_TABLET | Freq: Every day | ORAL | 0 refills | Status: DC

## 2016-03-23 ENCOUNTER — Ambulatory Visit (INDEPENDENT_AMBULATORY_CARE_PROVIDER_SITE_OTHER): Payer: Medicare Other

## 2016-03-23 DIAGNOSIS — E538 Deficiency of other specified B group vitamins: Secondary | ICD-10-CM

## 2016-03-23 MED ORDER — CYANOCOBALAMIN 1000 MCG/ML IJ SOLN
1000.0000 ug | Freq: Once | INTRAMUSCULAR | Status: AC
  Administered 2016-03-23: 1000 ug via INTRAMUSCULAR

## 2016-04-07 ENCOUNTER — Other Ambulatory Visit: Payer: Self-pay | Admitting: Cardiology

## 2016-04-12 DIAGNOSIS — D3132 Benign neoplasm of left choroid: Secondary | ICD-10-CM | POA: Diagnosis not present

## 2016-04-12 DIAGNOSIS — Z961 Presence of intraocular lens: Secondary | ICD-10-CM | POA: Diagnosis not present

## 2016-04-12 DIAGNOSIS — H524 Presbyopia: Secondary | ICD-10-CM | POA: Diagnosis not present

## 2016-04-12 DIAGNOSIS — H43813 Vitreous degeneration, bilateral: Secondary | ICD-10-CM | POA: Diagnosis not present

## 2016-06-02 ENCOUNTER — Ambulatory Visit (INDEPENDENT_AMBULATORY_CARE_PROVIDER_SITE_OTHER): Payer: Medicare Other

## 2016-06-02 DIAGNOSIS — E538 Deficiency of other specified B group vitamins: Secondary | ICD-10-CM

## 2016-06-02 MED ORDER — CYANOCOBALAMIN 1000 MCG/ML IJ SOLN
1000.0000 ug | Freq: Once | INTRAMUSCULAR | Status: AC
  Administered 2016-06-02: 1000 ug via INTRAMUSCULAR

## 2016-06-29 ENCOUNTER — Other Ambulatory Visit: Payer: Self-pay | Admitting: Family Medicine

## 2016-06-29 ENCOUNTER — Ambulatory Visit (INDEPENDENT_AMBULATORY_CARE_PROVIDER_SITE_OTHER): Payer: Medicare Other

## 2016-06-29 VITALS — BP 122/74 | HR 69 | Temp 98.5°F | Ht 60.0 in | Wt 133.5 lb

## 2016-06-29 DIAGNOSIS — Z0000 Encounter for general adult medical examination without abnormal findings: Secondary | ICD-10-CM

## 2016-06-29 DIAGNOSIS — R972 Elevated prostate specific antigen [PSA]: Secondary | ICD-10-CM

## 2016-06-29 DIAGNOSIS — E785 Hyperlipidemia, unspecified: Secondary | ICD-10-CM

## 2016-06-29 LAB — LIPID PANEL
CHOL/HDL RATIO: 4
Cholesterol: 135 mg/dL (ref 0–200)
HDL: 37.2 mg/dL — AB (ref >39.00)
LDL Cholesterol: 80 mg/dL (ref 0–99)
NONHDL: 98.24
Triglycerides: 91 mg/dL (ref 0.0–149.0)
VLDL: 18.2 mg/dL (ref 0.0–40.0)

## 2016-06-29 LAB — COMPREHENSIVE METABOLIC PANEL
ALT: 11 U/L (ref 0–53)
AST: 12 U/L (ref 0–37)
Albumin: 4.1 g/dL (ref 3.5–5.2)
Alkaline Phosphatase: 80 U/L (ref 39–117)
BILIRUBIN TOTAL: 0.8 mg/dL (ref 0.2–1.2)
BUN: 9 mg/dL (ref 6–23)
CO2: 29 meq/L (ref 19–32)
CREATININE: 0.95 mg/dL (ref 0.40–1.50)
Calcium: 9.5 mg/dL (ref 8.4–10.5)
Chloride: 106 mEq/L (ref 96–112)
GFR: 80.92 mL/min (ref >60.00)
Glucose, Bld: 118 mg/dL — ABNORMAL HIGH (ref 70–99)
Potassium: 4.4 mEq/L (ref 3.5–5.1)
SODIUM: 142 meq/L (ref 135–145)
Total Protein: 5.8 g/dL — ABNORMAL LOW (ref 6.0–8.3)

## 2016-06-29 LAB — CBC WITH DIFFERENTIAL/PLATELET
BASOS ABS: 0 10*3/uL (ref 0.0–0.1)
Basophils Relative: 0.5 % (ref 0.0–3.0)
Eosinophils Absolute: 0 10*3/uL (ref 0.0–0.7)
Eosinophils Relative: 0.7 % (ref 0.0–5.0)
HCT: 47.5 % (ref 39.0–52.0)
Hemoglobin: 15.9 g/dL (ref 13.0–17.0)
LYMPHS ABS: 0.9 10*3/uL (ref 0.7–4.0)
Lymphocytes Relative: 16.4 % (ref 12.0–46.0)
MCHC: 33.4 g/dL (ref 30.0–36.0)
MCV: 85.7 fl (ref 78.0–100.0)
MONOS PCT: 25.8 % — AB (ref 3.0–12.0)
Monocytes Absolute: 1.4 10*3/uL — ABNORMAL HIGH (ref 0.1–1.0)
NEUTROS PCT: 56.6 % (ref 43.0–77.0)
Neutro Abs: 3 10*3/uL (ref 1.4–7.7)
Platelets: 189 10*3/uL (ref 150.0–400.0)
RBC: 5.54 Mil/uL (ref 4.22–5.81)
RDW: 14.5 % (ref 11.5–15.5)
WBC: 5.4 10*3/uL (ref 4.0–10.5)

## 2016-06-29 LAB — PSA, MEDICARE: PSA: 7.42 ng/ml — ABNORMAL HIGH (ref 0.10–4.00)

## 2016-06-29 NOTE — Progress Notes (Signed)
PCP notes:   Health maintenance:  No gaps identified.  Abnormal screenings:   Hearing - failed Falls - multiple falls without injury and no medical treatment  Patient concerns:   Chronic back and bilateral hand pain are patient's main concerns. Pain scale: 5/10.  Nurse concerns:  None  Next PCP appt:   07/06/16 @ 0830

## 2016-06-29 NOTE — Progress Notes (Signed)
Pre visit review using our clinic review tool, if applicable. No additional management support is needed unless otherwise documented below in the visit note. 

## 2016-06-29 NOTE — Progress Notes (Signed)
 Subjective:   [name] is a 83 y.o. male who presents for Medicare Annual/Subsequent preventive examination.  Review of Systems:  N/A Cardiac Risk Factors include: advanced age (>55men, >65 women);male gender     Objective:    Vitals: BP 122/74 (BP Location: Right Arm, Patient Position: Sitting, Cuff Size: Normal)   Pulse 69   Temp 98.5 F (36.9 C) (Oral)   Ht 5' (1.524 m) Comment: no shoes  Wt 133 lb 8 oz (60.6 kg)   SpO2 97%   BMI 26.07 kg/m   Body mass index is 26.07 kg/m.  Tobacco History  Smoking Status  . Former Smoker  . Types: Pipe  . Quit date: 01/18/1966  Smokeless Tobacco  . Never Used     Counseling given: No   Past Medical History:  Diagnosis Date  . Arthralgia of multiple joints    limited mobility w/  independant adl's  . BPH with obstruction/lower urinary tract symptoms   . Chronic idiopathic monocytosis    followed by dr grandfortuna--  per lov note 06/ 2017 persistant unexplained  with normal bone barrow bx  . Coronary atherosclerosis of unspecified type of vessel, native or graft    cardiologist-  dr crenshaw -- per lov note 11-19-2014 ,  cardiac cath in 2000-- pLAD 60-70%,  small intermediate branch 70-80%,  mRCA 20%,  normal LV  . Degenerative arthritis of spine    cervical and lumar  . Diverticulosis of colon (without mention of hemorrhage)   . Dysuria    chronic  . Elevated PSA   . Feeling of incomplete bladder emptying   . Hiatal hernia    moderate per ct 11/ 2017  . History of adenomatous polyp of colon    2008  . History of atrial fibrillation    remote hx episode  . History of pancreatitis    07-01-2013  and 10-10-2015  . History of prostatitis    2014  . Hyperlipidemia   . Hypertension   . Mouth sore    roof of mouth sore   . Nephrolithiasis   . Nocturia more than twice per night    severe  w/ leakage  . OSA (obstructive sleep apnea)    INTOLERANT CPAP  . Osteoarthritis   . Pernicious anemia    B12  Def.  .  Peyronie disease   . Renal insufficiency   . Thrombocytopenia, unspecified (HCC) hemotology/oncologist-  dr grandfortuna   per dr grandfortuna lov note 06/ 2016  secondary to vitro clumping   Past Surgical History:  Procedure Laterality Date  . BONE MARROW BIOPSY  2011   normal  . CARDIAC CATHETERIZATION  2000   per dr crenshaw lov note --  dLAD 60-70%,  small intermediate branch 70-80%,  mRCA 20%,  normal LV  . CARDIOVASCULAR STRESS TEST  01-29-2013   dr crenshaw   normal nuclear study w/ no ischemia,  normal LV function and wall motion , ef 82%  . CATARACT EXTRACTION W/ INTRAOCULAR LENS  IMPLANT, BILATERAL  08/2015  . CHOLECYSTECTOMY  11/24/2010   Procedure: LAPAROSCOPIC CHOLECYSTECTOMY WITH INTRAOPERATIVE CHOLANGIOGRAM;  Surgeon: Todd M Gerkin, MD;  Location: [facility];  Service: General;  Laterality: N/A;  c-arm  . CYSTOSCOPY W/ URETERAL STENT PLACEMENT Left 12/17/2015   Procedure: CYSTOSCOPY WITH RETROGRADE PYELOGRAM/URETERAL STENT PLACEMENT;  Surgeon: Benjamin W Herrick, MD;  Location: [facility];  Service: Urology;  Laterality: Left;  . CYSTOSCOPY WITH RETROGRADE PYELOGRAM, URETEROSCOPY AND STENT PLACEMENT Left 12/22/2015     Procedure: CYSTOSCOPY WITH LEFT RETROGRADE  URETEROSCOPY AND STENT PLACEMENT;  Surgeon: Sigmund Tannenbaum, MD;  Location: [facility];  Service: Urology;  Laterality: Left;  . ERCP N/A 07/04/2013   Procedure: ENDOSCOPIC RETROGRADE CHOLANGIOPANCREATOGRAPHY (ERCP);  Surgeon: Carl E Gessner, MD;  Location: [facility];  Service: Endoscopy;  Laterality: N/A;  MAC if available  . ERCP N/A 10/12/2015   Procedure: ENDOSCOPIC RETROGRADE CHOLANGIOPANCREATOGRAPHY (ERCP);  Surgeon: Daniel P Jacobs, MD;  Location: [facility];  Service: Endoscopy;  Laterality: N/A;  . HOLMIUM LASER APPLICATION Left 12/22/2015   Procedure: HOLMIUM LASER APPLICATION;  Surgeon: Sigmund Tannenbaum, MD;  Location: [facility];  Service: Urology;  Laterality: Left;  . INGUINAL HERNIA  REPAIR Right 01/25/2002  . KNEE ARTHROSCOPY  x5  . SATURATION BIOPSY OF PROSTATE  05-29-2007  and 01-26-2008  . SHOULDER ARTHROSCOPY WITH OPEN ROTATOR CUFF REPAIR AND DISTAL CLAVICLE ACROMINECTOMY Right 11/19/2004  . TOTAL HIP ARTHROPLASTY Right 05/21/2009  . TOTAL KNEE ARTHROPLASTY Bilateral left 05-02-2003/  right  03-23-2010  . TRANSTHORACIC ECHOCARDIOGRAM  12/23/2004   ef 60%, mild MV calcification without stenosis/  mild TR,  PASP 36mmHg  . UVULOPALATOPHARYNGOPLASTY  1993    w/  T & A   Family History  Problem Relation Age of Onset  . Colon cancer Father 41  . Peripheral vascular disease Mother    History  Sexual Activity  . Sexual activity: No    Outpatient Encounter Prescriptions as of 06/29/2016  Medication Sig  . albuterol (PROVENTIL HFA;VENTOLIN HFA) 108 (90 Base) MCG/ACT inhaler Inhale 2 puffs into the lungs every 6 (six) hours as needed.  . Artificial Tear Solution (TEARS NATURALE OP) Place 1-2 drops into both eyes at bedtime as needed (dry eyes).  . aspirin EC 81 MG tablet Take 1 tablet (81 mg total) by mouth daily.  . HYDROcodone-acetaminophen (NORCO/VICODIN) 5-325 MG tablet Take 1 tablet by mouth every 4 (four) hours as needed for moderate pain.  . lidocaine (XYLOCAINE) 2 % solution   . metoprolol succinate (TOPROL-XL) 25 MG 24 hr tablet TAKE 1 TABLET BY MOUTH DAILY  . tamsulosin (FLOMAX) 0.4 MG CAPS capsule Take 0.4 mg by mouth daily after supper.    No facility-administered encounter medications on file as of 06/29/2016.     Activities of Daily Living In your present state of health, do you have any difficulty performing the following activities: 06/29/2016 12/22/2015  Hearing? Y -  Vision? N -  Difficulty concentrating or making decisions? Y -  Walking or climbing stairs? Y -  Dressing or bathing? N -  Doing errands, shopping? N N  Preparing Food and eating ? N -  Using the Toilet? N -  In the past six months, have you accidently leaked urine? N -  Do you  have problems with loss of bowel control? N -  Managing your Medications? N -  Managing your Finances? N -  Housekeeping or managing your Housekeeping? N -  Some recent data might be hidden    Patient Care Team: Aron, Talia M, MD as PCP - General (Family Medicine) Granfortuna, James M, MD as Consulting Physician (Oncology) Tannenbaum, Sigmund, MD as Consulting Physician (Urology) Brodie, Dora M, MD (Inactive) as Consulting Physician (Gastroenterology) Crenshaw, Brian S, MD as Consulting Physician (Cardiology)   Assessment:     Hearing Screening   125Hz 250Hz 500Hz 1000Hz 2000Hz 3000Hz 4000Hz 6000Hz 8000Hz  Right ear:   0 0 0  0    Left ear:     0 0 0  0    Vision Screening Comments: Last vision exam approx. 3 mths ago   Exercise Activities and Dietary recommendations Current Exercise Habits: The patient does not participate in regular exercise at present, Exercise limited by: None identified  Goals    . emotional health          Staring 06/29/2016, I will continue to make wind chimes and do other hobbies as desired.       Fall Risk Fall Risk  06/29/2016 11/11/2015 03/12/2015 07/01/2014 01/01/2014  Falls in the past year? Yes No Yes Yes No  Number falls in past yr: 2 or more - 1 2 or more -  Injury with Fall? No - Yes No -  Risk Factor Category  - - - High Fall Risk -  Risk for fall due to : - - - Impaired balance/gait -  Follow up - - - Falls prevention discussed -   Depression Screen PHQ 2/9 Scores 06/29/2016 11/11/2015 03/12/2015 03/12/2015  PHQ - 2 Score 0 0 2 0  PHQ- 9 Score - - - -    Cognitive Function MMSE - Mini Mental State Exam 06/29/2016  Orientation to time 5  Orientation to Place 5  Registration 3  Attention/ Calculation 0  Recall 3  Language- name 2 objects 0  Language- repeat 1  Language- follow 3 step command 3  Language- read & follow direction 0  Write a sentence 0  Copy design 0  Total score 20     PLEASE NOTE: A Mini-Cog screen was completed.  Maximum score is 20. A value of 0 denotes this part of Folstein MMSE was not completed or the patient failed this part of the Mini-Cog screening.   Mini-Cog Screening Orientation to Time - Max 5 pts Orientation to Place - Max 5 pts Registration - Max 3 pts Recall - Max 3 pts Language Repeat - Max 1 pts Language Follow 3 Step Command - Max 3 pts     Immunization History  Administered Date(s) Administered  . Influenza Split 11/12/2010, 10/26/2011  . Influenza Whole 11/14/2007, 10/24/2008, 10/16/2009  . Influenza,inj,Quad PF,36+ Mos 09/21/2012, 09/06/2013, 10/24/2014, 12/30/2015  . Pneumococcal Conjugate-13 09/06/2013  . Pneumococcal Polysaccharide-23 01/11/2007  . Td 05/05/2011  . Zoster 03/13/2007   Screening Tests Health Maintenance  Topic Date Due  . INFLUENZA VACCINE  08/18/2016  . TETANUS/TDAP  05/04/2021  . PNA vac Low Risk Adult  Completed      Plan:     I have personally reviewed and addressed the Medicare Annual Wellness questionnaire and have noted the following in the patient's chart:  A. Medical and social history B. Use of alcohol, tobacco or illicit drugs  C. Current medications and supplements D. Functional ability and status E.  Nutritional status F.  Physical activity G. Advance directives H. List of other physicians I.  Hospitalizations, surgeries, and ER visits in previous 12 months J.  [place] to include hearing, vision, cognitive, depression L. Referrals and appointments - none  In addition, I have reviewed and discussed with patient certain preventive protocols, quality metrics, and best practice recommendations. A written personalized care plan for preventive services as well as general preventive health recommendations were provided to patient.  See attached scanned questionnaire for additional information.   Signed,   Lindell Noe, MHA, BS, LPN Health Coach

## 2016-06-29 NOTE — Patient Instructions (Signed)
[name] , Thank you for taking time to come for your Medicare Wellness Visit. I appreciate your ongoing commitment to your health goals. Please review the following plan we discussed and let me know if I can assist you in the future.   These are the goals we discussed: Goals    . emotional health          Staring 06/29/2016, I will continue to make wind chimes and do other hobbies as desired.        This is a list of the screening recommended for you and due dates:  Health Maintenance  Topic Date Due  . Flu Shot  08/18/2016  . Tetanus Vaccine  05/04/2021  . Pneumonia vaccines  Completed   Preventive Care for Adults  A healthy lifestyle and preventive care can promote health and wellness. Preventive health guidelines for adults include the following key practices.  . A routine yearly physical is a good way to check with your health care provider about your health and preventive screening. It is a chance to share any concerns and updates on your health and to receive a thorough exam.  . Visit your dentist for a routine exam and preventive care every 6 months. Brush your teeth twice a day and floss once a day. Good oral hygiene prevents tooth decay and gum disease.  . The frequency of eye exams is based on your age, health, family medical history, use  of contact lenses, and other factors. Follow your health care provider's ecommendations for frequency of eye exams.  . Eat a healthy diet. Foods like vegetables, fruits, whole grains, low-fat dairy products, and lean protein foods contain the nutrients you need without too many calories. Decrease your intake of foods high in solid fats, added sugars, and salt. Eat the right amount of calories for you. Get information about a proper diet from your health care provider, if necessary.  . Regular physical exercise is one of the most important things you can do for your health. Most adults should get at least 150 minutes of  moderate-intensity exercise (any activity that increases your heart rate and causes you to sweat) each week. In addition, most adults need muscle-strengthening exercises on 2 or more days a week.  Silver Sneakers may be a benefit available to you. To determine eligibility, you may visit the website: www.silversneakers.com or contact program at 314-600-2183 Mon-Fri between 8AM-8PM.   . Maintain a healthy weight. The body mass index (BMI) is a screening tool to identify possible weight problems. It provides an estimate of body fat based on height and weight. Your health care provider can find your BMI and can help you achieve or maintain a healthy weight.   For adults 20 years and older: ? A BMI below 18.5 is considered underweight. ? A BMI of 18.5 to 24.9 is normal. ? A BMI of 25 to 29.9 is considered overweight. ? A BMI of 30 and above is considered obese.   . Maintain normal blood lipids and cholesterol levels by exercising and minimizing your intake of saturated fat. Eat a balanced diet with plenty of fruit and vegetables. Blood tests for lipids and cholesterol should begin at age 105 and be repeated every 5 years. If your lipid or cholesterol levels are high, you are over 50, or you are at high risk for heart disease, you may need your cholesterol levels checked more frequently. Ongoing high lipid and cholesterol levels should be treated with medicines if diet and  exercise are not working.  . If you smoke, find out from your health care provider how to quit. If you do not use tobacco, please do not start.  . If you choose to drink alcohol, please do not consume more than 2 drinks per day. One drink is considered to be 12 ounces (355 mL) of beer, 5 ounces (148 mL) of wine, or 1.5 ounces (44 mL) of liquor.  . If you are 76-79 years old, ask your health care provider if you should take aspirin to prevent strokes.  . Use sunscreen. Apply sunscreen liberally and repeatedly throughout the day. You  should seek shade when your shadow is shorter than you. Protect yourself by wearing long sleeves, pants, a wide-brimmed hat, and sunglasses year round, whenever you are outdoors.  . Once a month, do a whole body skin exam, using a mirror to look at the skin on your back. Tell your health care provider of new moles, moles that have irregular borders, moles that are larger than a pencil eraser, or moles that have changed in shape or color.

## 2016-07-06 ENCOUNTER — Encounter: Payer: Self-pay | Admitting: Family Medicine

## 2016-07-06 ENCOUNTER — Ambulatory Visit (INDEPENDENT_AMBULATORY_CARE_PROVIDER_SITE_OTHER): Payer: Medicare Other | Admitting: Family Medicine

## 2016-07-06 VITALS — BP 128/78 | HR 84 | Ht 60.0 in | Wt 133.0 lb

## 2016-07-06 DIAGNOSIS — R1310 Dysphagia, unspecified: Secondary | ICD-10-CM | POA: Diagnosis not present

## 2016-07-06 DIAGNOSIS — E538 Deficiency of other specified B group vitamins: Secondary | ICD-10-CM | POA: Diagnosis not present

## 2016-07-06 DIAGNOSIS — I10 Essential (primary) hypertension: Secondary | ICD-10-CM

## 2016-07-06 DIAGNOSIS — Z86010 Personal history of colonic polyps: Secondary | ICD-10-CM

## 2016-07-06 DIAGNOSIS — D696 Thrombocytopenia, unspecified: Secondary | ICD-10-CM

## 2016-07-06 DIAGNOSIS — D510 Vitamin B12 deficiency anemia due to intrinsic factor deficiency: Secondary | ICD-10-CM | POA: Diagnosis not present

## 2016-07-06 DIAGNOSIS — F329 Major depressive disorder, single episode, unspecified: Secondary | ICD-10-CM

## 2016-07-06 DIAGNOSIS — E785 Hyperlipidemia, unspecified: Secondary | ICD-10-CM

## 2016-07-06 DIAGNOSIS — Z66 Do not resuscitate: Secondary | ICD-10-CM | POA: Diagnosis not present

## 2016-07-06 DIAGNOSIS — Z0000 Encounter for general adult medical examination without abnormal findings: Secondary | ICD-10-CM | POA: Diagnosis not present

## 2016-07-06 DIAGNOSIS — F32A Depression, unspecified: Secondary | ICD-10-CM

## 2016-07-06 MED ORDER — CYANOCOBALAMIN 1000 MCG/ML IJ SOLN
1000.0000 ug | Freq: Once | INTRAMUSCULAR | Status: AC
  Administered 2016-07-06: 1000 ug via INTRAMUSCULAR

## 2016-07-06 NOTE — Assessment & Plan Note (Signed)
Reviewed preventive care protocols, scheduled due services, and updated immunizations Discussed nutrition, exercise, diet, and healthy lifestyle.  

## 2016-07-06 NOTE — Patient Instructions (Signed)
Great to see you. We are referring you back to GI for your swallowing issues.

## 2016-07-06 NOTE — Assessment & Plan Note (Signed)
Refer back to Dr. Ardis Hughs for endoscopy- re: rule out esophageal stricture/malignancy/etc.

## 2016-07-06 NOTE — Assessment & Plan Note (Signed)
IM b12 today.

## 2016-07-06 NOTE — Progress Notes (Signed)
Pre visit review using our clinic review tool, if applicable. No additional management support is needed unless otherwise documented below in the visit note. 

## 2016-07-06 NOTE — Addendum Note (Signed)
Addended by: Mady Haagensen on: 07/06/2016 09:05 AM   Modules accepted: Orders

## 2016-07-06 NOTE — Progress Notes (Signed)
Subjective:   Patient ID: [name], male    DOB: 05-Jan-1934, 83 y.o.   MRN: [id number]  [name] is a pleasant 83 y.o. year old male who presents to clinic today for CPX and follow up of chronic medical conditions  Annual Exam  on 07/06/2016  HPI:  Annual medicare wellness visit with Candis Musa, RN on 06/29/16. Note reviewed.    Colonoscopy May 2011 Td 05/05/11 Zoster 03/13/07 Pneumovax 01/11/07 Prevnar 13 09/06/13 Influenza vaccine 12/30/15 Last eye exam 01/2013    Wt Readings from Last 3 Encounters:  07/06/16 133 lb (60.3 kg)  06/29/16 133 lb 8 oz (60.6 kg)  12/30/15 137 lb 4 oz (62.3 kg)   HTN- BP has been well controlled on current dose of toprol.  Denies HA, blurred vision, CP or SOB.  Dysphagia- Since ERCP in 92/107, progressive dysphagia- food and pills getting stuck. Has been losing weight because of this. Wt Readings from Last 3 Encounters:  07/06/16 133 lb (60.3 kg)  06/29/16 133 lb 8 oz (60.6 kg)  12/30/15 137 lb 4 oz (62.3 kg)     Lab Results  Component Value Date   ALT 11 06/29/2016   AST 12 06/29/2016   ALKPHOS 80 06/29/2016   BILITOT 0.8 06/29/2016   Thrombocytopenia with h/o pernicious anemia- was followed by Dr. Beryle Beams.   Has been stable for years now. Lab Results  Component Value Date   WBC 5.4 06/29/2016   HGB 15.9 06/29/2016   HCT 47.5 06/29/2016   MCV 85.7 06/29/2016   PLT 189.0 06/29/2016   Lab Results  Component Value Date   ITGPQDIY64 158 03/12/2015    Lab Results  Component Value Date   CHOL 135 06/29/2016   HDL 37.20 (L) 06/29/2016   LDLCALC 80 06/29/2016   TRIG 91.0 06/29/2016   CHOLHDL 4 06/29/2016    Current Outpatient Prescriptions on File Prior to Visit  Medication Sig Dispense Refill  . albuterol (PROVENTIL HFA;VENTOLIN HFA) 108 (90 Base) MCG/ACT inhaler Inhale 2 puffs into the lungs every 6 (six) hours as needed. 1 Inhaler 0  . Artificial Tear Solution (TEARS NATURALE OP) Place 1-2 drops  into both eyes at bedtime as needed (dry eyes).    Marland Kitchen aspirin EC 81 MG tablet Take 1 tablet (81 mg total) by mouth daily.    Marland Kitchen HYDROcodone-acetaminophen (NORCO/VICODIN) 5-325 MG tablet Take 1 tablet by mouth every 4 (four) hours as needed for moderate pain. 21 tablet 0  . lidocaine (XYLOCAINE) 2 % solution     . metoprolol succinate (TOPROL-XL) 25 MG 24 hr tablet TAKE 1 TABLET BY MOUTH DAILY 30 tablet 0  . tamsulosin (FLOMAX) 0.4 MG CAPS capsule Take 0.4 mg by mouth daily after supper.      No current facility-administered medications on file prior to visit.     Allergies  Allergen Reactions  . Penicillins Hives and Other (See Comments)    [place], passed out Tolerates cephalosporins  Has patient had a PCN reaction causing immediate rash, facial/tongue/throat swelling, SOB or lightheadedness with hypotension:  yes Has patient had a PCN reaction causing severe rash involving mucus membranes or skin necrosis: no Has patient had a PCN reaction that required hospitalization: no Has patient had a PCN reaction occurring within the last 10 years: no If all of the above answers are "NO", then may proceed with Cephalosporin use.     Past Medical History:  Diagnosis Date  . Arthralgia of multiple joints  limited mobility w/  independant adl's  . BPH with obstruction/lower urinary tract symptoms   . Chronic idiopathic monocytosis    followed by dr Waymon Budge--  per lov note 06/ 2017 persistant unexplained  with normal bone barrow bx  . Coronary atherosclerosis of unspecified type of vessel, native or graft    cardiologist-  dr Stanford Breed -- per lov note 11-19-2014 ,  cardiac cath in 2000-- pLAD 60-70%,  small intermediate branch 70-80%,  mRCA 20%,  normal LV  . Degenerative arthritis of spine    cervical and lumar  . Diverticulosis of colon (without mention of hemorrhage)   . Dysuria    chronic  . Elevated PSA   . Feeling of incomplete bladder emptying   . Hiatal hernia    moderate per  ct 11/ 2017  . History of adenomatous polyp of colon    2008  . History of atrial fibrillation    remote hx episode  . History of pancreatitis    07-01-2013  and 10-10-2015  . History of prostatitis    2014  . Hyperlipidemia   . Hypertension   . Mouth sore    roof of mouth sore   . Nephrolithiasis   . Nocturia more than twice per night    severe  w/ leakage  . OSA (obstructive sleep apnea)    INTOLERANT CPAP  . Osteoarthritis   . Pernicious anemia    B12  Def.  . Peyronie disease   . Renal insufficiency   . Thrombocytopenia, unspecified ([place]) hemotology/oncologist-  dr Waymon Budge   per dr Velta Addison note 06/ 2016  secondary to vitro clumping    Past Surgical History:  Procedure Laterality Date  . BONE MARROW BIOPSY  2011   normal  . CARDIAC CATHETERIZATION  2000   per dr Kathyrn Drown note --  dLAD 60-70%,  small intermediate branch 70-80%,  mRCA 20%,  normal LV  . CARDIOVASCULAR STRESS TEST  01-29-2013   dr Stanford Breed   normal nuclear study w/ no ischemia,  normal LV function and wall motion , ef 82%  . CATARACT EXTRACTION W/ INTRAOCULAR LENS  IMPLANT, BILATERAL  08/2015  . CHOLECYSTECTOMY  11/24/2010   Procedure: LAPAROSCOPIC CHOLECYSTECTOMY WITH INTRAOPERATIVE CHOLANGIOGRAM;  Surgeon: Earnstine Regal, MD;  Location: [facility];  Service: General;  Laterality: N/A;  c-arm  . CYSTOSCOPY W/ URETERAL STENT PLACEMENT Left 12/17/2015   Procedure: CYSTOSCOPY WITH RETROGRADE PYELOGRAM/URETERAL STENT PLACEMENT;  Surgeon: Ardis Hughs, MD;  Location: [facility];  Service: Urology;  Laterality: Left;  . CYSTOSCOPY WITH RETROGRADE PYELOGRAM, URETEROSCOPY AND STENT PLACEMENT Left 12/22/2015   Procedure: CYSTOSCOPY WITH LEFT RETROGRADE  URETEROSCOPY AND STENT PLACEMENT;  Surgeon: Carolan Clines, MD;  Location: [facility];  Service: Urology;  Laterality: Left;  . ERCP N/A 07/04/2013   Procedure: ENDOSCOPIC RETROGRADE CHOLANGIOPANCREATOGRAPHY (ERCP);  Surgeon: Gatha Mayer, MD;  Location: [facility];  Service: Endoscopy;  Laterality: N/A;  MAC if available  . ERCP N/A 10/12/2015   Procedure: ENDOSCOPIC RETROGRADE CHOLANGIOPANCREATOGRAPHY (ERCP);  Surgeon: [name], MD;  Location: [facility];  Service: Endoscopy;  Laterality: N/A;  . HOLMIUM LASER APPLICATION Left 11/23/5206   Procedure: HOLMIUM LASER APPLICATION;  Surgeon: Carolan Clines, MD;  Location: [facility];  Service: Urology;  Laterality: Left;  . INGUINAL HERNIA REPAIR Right 01/25/2002  . KNEE ARTHROSCOPY  x5  . SATURATION BIOPSY OF PROSTATE  05-29-2007  and 01-26-2008  . SHOULDER ARTHROSCOPY WITH OPEN ROTATOR CUFF REPAIR  AND DISTAL CLAVICLE ACROMINECTOMY Right 11/19/2004  . TOTAL HIP ARTHROPLASTY Right 05/21/2009  . TOTAL KNEE ARTHROPLASTY Bilateral left 05-02-2003/  right  03-23-2010  . TRANSTHORACIC ECHOCARDIOGRAM  12/23/2004   ef 60%, mild MV calcification without stenosis/  mild TR,  PASP 82mHg  . UVULOPALATOPHARYNGOPLASTY  1993    w/  T & A    Family History  Problem Relation Age of Onset  . Colon cancer Father 462 . Peripheral vascular disease Mother     Social History   Social History  . Marital status: Widowed    Spouse name: N/A  . Number of children: 2  . Years of education: N/A   Occupational History  . retired Retired   Social History Main Topics  . Smoking status: Former Smoker    Types: Pipe    Quit date: 01/18/1966  . Smokeless tobacco: Never Used  . Alcohol use No  . Drug use: No  . Sexual activity: No   Other Topics Concern  . Not on file   Social History Narrative   DNR   Widowed   2 daughters leave near by   The PMH, PSH, Social History, Family History, Medications, and allergies have been reviewed in [facility] and have been updated if relevant.    Review of Systems  Constitutional: Positive for fatigue and unexpected weight change.  HENT: Negative.   Eyes: Negative.   Respiratory: Negative.   Cardiovascular: Negative.     Gastrointestinal: Negative.        + dyphagia  Endocrine: Negative.   Genitourinary: Negative.   Musculoskeletal: Negative.   Skin: Negative.   Allergic/Immunologic: Negative.   Neurological: Negative.   Hematological: Negative.   Psychiatric/Behavioral: Negative.   All other systems reviewed and are negative.     Objective:    BP 128/78   Pulse 84   Ht 5' (1.524 m)   Wt 133 lb (60.3 kg)   SpO2 95%   BMI 25.97 kg/m   Wt Readings from Last 3 Encounters:  07/06/16 133 lb (60.3 kg)  06/29/16 133 lb 8 oz (60.6 kg)  12/30/15 137 lb 4 oz (62.3 kg)     Physical Exam  General:  pleasant male in no acute distress Eyes:  PERRL Ears:  External ear exam shows no significant lesions or deformities.  TMs normal bilaterally Hearing is grossly normal bilaterally. Nose:  External nasal examination shows no deformity or inflammation. Nasal mucosa are pink and moist without lesions or exudates. Mouth:  Oral mucosa and oropharynx without lesions or exudates.  Teeth in good repair. Neck:  no carotid bruit or thyromegaly no cervical or supraclavicular lymphadenopathy  Lungs:  Normal respiratory effort, chest expands symmetrically. Lungs are clear to auscultation, no crackles or wheezes. Heart:  Normal rate and regular rhythm. S1 and S2 normal without gallop, murmur, click, rub or other extra sounds. Abdomen:  Bowel sounds positive,abdomen soft and non-tender without masses, organomegaly or hernias noted. Pulses:  R and L posterior tibial pulses are full and equal bilaterally  Extremities:  no edema  Psych:  Good eye contact, not anxious or depressed appearing        Assessment & Plan:   ANEMIA, PERNICIOUS  Depression, unspecified depression type  Essential hypertension  COLONIC POLYPS, HX OF  DNR (do not resuscitate)  Visit for well man health check No Follow-up on file.

## 2016-07-06 NOTE — Assessment & Plan Note (Signed)
CBC has been stable for years.

## 2016-07-06 NOTE — Assessment & Plan Note (Signed)
Well controlled with diet. 

## 2016-07-06 NOTE — Assessment & Plan Note (Signed)
Well controlled.  No changes made. 

## 2016-07-12 DIAGNOSIS — Z85828 Personal history of other malignant neoplasm of skin: Secondary | ICD-10-CM | POA: Diagnosis not present

## 2016-07-12 DIAGNOSIS — L905 Scar conditions and fibrosis of skin: Secondary | ICD-10-CM | POA: Diagnosis not present

## 2016-07-12 DIAGNOSIS — L570 Actinic keratosis: Secondary | ICD-10-CM | POA: Diagnosis not present

## 2016-07-12 NOTE — Progress Notes (Signed)
I reviewed health advisor's note, was available for consultation, and agree with documentation and plan.  

## 2016-10-07 ENCOUNTER — Encounter: Payer: Self-pay | Admitting: Physician Assistant

## 2016-10-07 ENCOUNTER — Ambulatory Visit (INDEPENDENT_AMBULATORY_CARE_PROVIDER_SITE_OTHER): Payer: Medicare Other | Admitting: Physician Assistant

## 2016-10-07 ENCOUNTER — Other Ambulatory Visit: Payer: Self-pay | Admitting: Physician Assistant

## 2016-10-07 ENCOUNTER — Ambulatory Visit (INDEPENDENT_AMBULATORY_CARE_PROVIDER_SITE_OTHER): Payer: Medicare Other

## 2016-10-07 VITALS — BP 136/72 | HR 90 | Ht 60.0 in | Wt 129.0 lb

## 2016-10-07 DIAGNOSIS — I10 Essential (primary) hypertension: Secondary | ICD-10-CM | POA: Diagnosis not present

## 2016-10-07 DIAGNOSIS — I2510 Atherosclerotic heart disease of native coronary artery without angina pectoris: Secondary | ICD-10-CM

## 2016-10-07 DIAGNOSIS — E785 Hyperlipidemia, unspecified: Secondary | ICD-10-CM

## 2016-10-07 DIAGNOSIS — E538 Deficiency of other specified B group vitamins: Secondary | ICD-10-CM

## 2016-10-07 DIAGNOSIS — I480 Paroxysmal atrial fibrillation: Secondary | ICD-10-CM

## 2016-10-07 MED ORDER — METOPROLOL SUCCINATE ER 25 MG PO TB24: 25.0000 mg | ORAL_TABLET | Freq: Every day | ORAL | 3 refills | Status: DC

## 2016-10-07 MED ORDER — CYANOCOBALAMIN 1000 MCG/ML IJ SOLN
1000.0000 ug | Freq: Once | INTRAMUSCULAR | Status: AC
  Administered 2016-10-07: 1000 ug via INTRAMUSCULAR

## 2016-10-07 NOTE — Patient Instructions (Signed)
Medication Instructions: Your physician recommends that you continue on your current medications as directed. Please refer to the Current Medication list given to you today.  If you need a refill on your cardiac medications before your next appointment, please call your pharmacy.    Follow-Up: Your physician wants you to follow-up in: 83 month with Dr. Stanford Breed. You will receive a reminder letter in the mail two months in advance. If you don't receive a letter, please call our office at [phone] to schedule this follow-up appointment.   Thank you for choosing [facility] at [facility]!!

## 2016-10-07 NOTE — Progress Notes (Signed)
Cardiology Office Note    Date:  10/08/2016   ID:  [name], DOB 04-17-1933, MRN [id number]  PCP:  Lucille Passy, MD  Cardiologist:  Dr. Stanford Breed   Chief Complaint  Patient presents with  . Follow-up    seen for Dr. Stanford Breed    History of Present Illness:  [name] is a 83 y.o. male with PMH of Elevated PSA, remote episode of atrial fibrillation without recurrence, hypertension, hyperlipidemia, OSA intolerant to CPAP and CAD. Last cardiac catheterization around 2000, he had a 60-70% proximal LAD lesion, 70-80% stenosis in small intermediate, 20% stenosis in mid RCA. LV function was normal. Her most recent Myoview was performed in January 2015. EF was 82%, no ischemia or infarction. Abdominal CT in June 2015 showed no aneurysm. He has declined statins for coronary atherosclerosis. His last office visit was on 11/19/2014, it was recommended he follow-up on an as-needed basis. He was treated for left ureteral stone in December 2017.  He has been doing well from cardiac perspective. He denies any exertional chest pain or shortness breath in the past year. He did have some nausea and abdominal discomfort after heavy lifting recently, but no chest pain. He says he also had nausea and vomiting last year with the kidney stone, interestingly he never developed a flank pain with acute kidney stone. More recently, he has been noticing some degree of blood in the urine, I will ask him to follow-up with his urologist or PCP. Otherwise, he has no LE edema, orthopnea or PND.    Past Medical History:  Diagnosis Date  . Arthralgia of multiple joints    limited mobility w/  independant adl's  . BPH with obstruction/lower urinary tract symptoms   . Chronic idiopathic monocytosis    followed by dr Waymon Budge--  per lov note 06/ 2017 persistant unexplained  with normal bone barrow bx  . Coronary atherosclerosis of unspecified type of vessel, native or graft    cardiologist-  dr Stanford Breed --  per lov note 11-19-2014 ,  cardiac cath in 2000-- pLAD 60-70%,  small intermediate branch 70-80%,  mRCA 20%,  normal LV  . Degenerative arthritis of spine    cervical and lumar  . Diverticulosis of colon (without mention of hemorrhage)   . Dysuria    chronic  . Elevated PSA   . Feeling of incomplete bladder emptying   . Hiatal hernia    moderate per ct 11/ 2017  . History of adenomatous polyp of colon    2008  . History of atrial fibrillation    remote hx episode  . History of pancreatitis    07-01-2013  and 10-10-2015  . History of prostatitis    2014  . Hyperlipidemia   . Hypertension   . Mouth sore    roof of mouth sore   . Nephrolithiasis   . Nocturia more than twice per night    severe  w/ leakage  . OSA (obstructive sleep apnea)    INTOLERANT CPAP  . Osteoarthritis   . Pernicious anemia    B12  Def.  . Peyronie disease   . Renal insufficiency   . Thrombocytopenia, unspecified ([place]) hemotology/oncologist-  dr Waymon Budge   per dr Velta Addison note 06/ 2016  secondary to vitro clumping    Past Surgical History:  Procedure Laterality Date  . BONE MARROW BIOPSY  2011   normal  . CARDIAC CATHETERIZATION  2000   per dr Kathyrn Drown note --  dLAD 60-70%,  small intermediate branch 70-80%,  mRCA 20%,  normal LV  . CARDIOVASCULAR STRESS TEST  01-29-2013   dr Stanford Breed   normal nuclear study w/ no ischemia,  normal LV function and wall motion , ef 82%  . CATARACT EXTRACTION W/ INTRAOCULAR LENS  IMPLANT, BILATERAL  08/2015  . CHOLECYSTECTOMY  11/24/2010   Procedure: LAPAROSCOPIC CHOLECYSTECTOMY WITH INTRAOPERATIVE CHOLANGIOGRAM;  Surgeon: Earnstine Regal, MD;  Location: [facility];  Service: General;  Laterality: N/A;  c-arm  . CYSTOSCOPY W/ URETERAL STENT PLACEMENT Left 12/17/2015   Procedure: CYSTOSCOPY WITH RETROGRADE PYELOGRAM/URETERAL STENT PLACEMENT;  Surgeon: Ardis Hughs, MD;  Location: [facility];  Service: Urology;  Laterality: Left;  . CYSTOSCOPY WITH RETROGRADE  PYELOGRAM, URETEROSCOPY AND STENT PLACEMENT Left 12/22/2015   Procedure: CYSTOSCOPY WITH LEFT RETROGRADE  URETEROSCOPY AND STENT PLACEMENT;  Surgeon: Carolan Clines, MD;  Location: [facility];  Service: Urology;  Laterality: Left;  . ERCP N/A 07/04/2013   Procedure: ENDOSCOPIC RETROGRADE CHOLANGIOPANCREATOGRAPHY (ERCP);  Surgeon: Gatha Mayer, MD;  Location: [facility];  Service: Endoscopy;  Laterality: N/A;  MAC if available  . ERCP N/A 10/12/2015   Procedure: ENDOSCOPIC RETROGRADE CHOLANGIOPANCREATOGRAPHY (ERCP);  Surgeon: [name], MD;  Location: [facility];  Service: Endoscopy;  Laterality: N/A;  . HOLMIUM LASER APPLICATION Left 51/0/2585   Procedure: HOLMIUM LASER APPLICATION;  Surgeon: Carolan Clines, MD;  Location: [facility];  Service: Urology;  Laterality: Left;  . INGUINAL HERNIA REPAIR Right 01/25/2002  . KNEE ARTHROSCOPY  x5  . SATURATION BIOPSY OF PROSTATE  05-29-2007  and 01-26-2008  . SHOULDER ARTHROSCOPY WITH OPEN ROTATOR CUFF REPAIR AND DISTAL CLAVICLE ACROMINECTOMY Right 11/19/2004  . TOTAL HIP ARTHROPLASTY Right 05/21/2009  . TOTAL KNEE ARTHROPLASTY Bilateral left 05-02-2003/  right  03-23-2010  . TRANSTHORACIC ECHOCARDIOGRAM  12/23/2004   ef 60%, mild MV calcification without stenosis/  mild TR,  PASP 56mHg  . UVULOPALATOPHARYNGOPLASTY  1993    w/  T & A    Current Medications: Outpatient Medications Prior to Visit  Medication Sig Dispense Refill  . albuterol (PROVENTIL HFA;VENTOLIN HFA) 108 (90 Base) MCG/ACT inhaler Inhale 2 puffs into the lungs every 6 (six) hours as needed. 1 Inhaler 0  . Artificial Tear Solution (TEARS NATURALE OP) Place 1-2 drops into both eyes at bedtime as needed (dry eyes).    .Marland Kitchenaspirin EC 81 MG tablet Take 1 tablet (81 mg total) by mouth daily.    .Marland Kitchenlidocaine (XYLOCAINE) 2 % solution     . metoprolol succinate (TOPROL-XL) 25 MG 24 hr tablet TAKE 1 TABLET BY MOUTH DAILY 30 tablet 0  . tamsulosin  (FLOMAX) 0.4 MG CAPS capsule Take 0.4 mg by mouth daily after supper.     .Marland KitchenHYDROcodone-acetaminophen (NORCO/VICODIN) 5-325 MG tablet Take 1 tablet by mouth every 4 (four) hours as needed for moderate pain. 21 tablet 0   No facility-administered medications prior to visit.      Allergies:   Penicillins   Social History   Social History  . Marital status: Widowed    Spouse name: N/A  . Number of children: 2  . Years of education: N/A   Occupational History  . retired Retired   Social History Main Topics  . Smoking status: Former Smoker    Types: Pipe    Quit date: 01/18/1966  . Smokeless tobacco: Never Used  . Alcohol use No  . Drug use: No  . Sexual activity: No   Other  Topics Concern  . None   Social History Narrative   DNR   Widowed   2 daughters leave near by     Family History:  The patient's family history includes Colon cancer (age of onset: 37) in his father; Peripheral vascular disease in his mother.   ROS:   Please see the history of present illness.    ROS All other systems reviewed and are negative.   PHYSICAL EXAM:   VS:  BP 136/72   Pulse 90   Ht 5' (1.524 m)   Wt 129 lb (58.5 kg)   BMI 25.19 kg/m    GEN: Well nourished, well developed, in no acute distress  HEENT: normal  Neck: no JVD, carotid bruits, or masses Cardiac: RRR; no murmurs, rubs, or gallops,no edema  Respiratory:  clear to auscultation bilaterally, normal work of breathing GI: soft, nontender, nondistended, + BS MS: no deformity or atrophy  Skin: warm and dry, no rash Neuro:  Alert and Oriented x 3, Strength and sensation are intact Psych: euthymic mood, full affect  Wt Readings from Last 3 Encounters:  10/08/16 128 lb 8 oz (58.3 kg)  10/07/16 129 lb (58.5 kg)  07/06/16 133 lb (60.3 kg)      Studies/Labs Reviewed:   EKG:  EKG is ordered today.  The ekg ordered today demonstrates Normal sinus rhythm, PACs and an ectopic atrial beat.  Recent Labs: 06/29/2016: ALT 11; BUN  9; Creatinine, Ser 0.95; Hemoglobin 15.9; Platelets 189.0; Potassium 4.4; Sodium 142   Lipid Panel    Component Value Date/Time   CHOL 135 06/29/2016 0910   TRIG 91.0 06/29/2016 0910   HDL 37.20 (L) 06/29/2016 0910   CHOLHDL 4 06/29/2016 0910   VLDL 18.2 06/29/2016 0910   LDLCALC 80 06/29/2016 0910    Additional studies/ records that were reviewed today include:   Myoview 01/29/2013 Impression Exercise Capacity:  Lexiscan with no exercise. BP Response:  Normal blood pressure response. Clinical Symptoms:  No chest pain. ECG Impression:  No significant ST segment change suggestive of ischemia. Comparison with Prior Nuclear Study: No significant change from previous study  Overall Impression:  Normal stress nuclear study.  LV Ejection Fraction: 82%.  LV Wall Motion:  NL LV Function; NL Wall Motion   ASSESSMENT:    1. Coronary artery disease involving native coronary artery of native heart without angina pectoris   2. Essential hypertension   3. Hyperlipidemia, unspecified hyperlipidemia type   4. PAF (paroxysmal atrial fibrillation) (HCC)      PLAN:  In order of problems listed above:  1. CAD: No exertional chest discomfort or shortness of breath. Last myoview 2015 low risk.  2. Hypertension: Continue Toprol-XL, blood pressure mildly elevated today, however was normal on the same medication 3 months ago. Will hold off on adjusting medication.  3. Hyperlipidemia: He is not on a statin medication, last lipid panel in June 2018 shows LDL 80, HDL 37. We reviewed the lab and I recommended a low-dose statin, he refused statin medication.  4. Remote PAF: No recurrence, if it recurs we will then consider systemic anticoagulation. He is currently on aspirin only.    Medication Adjustments/Labs and Tests Ordered: Current medicines are reviewed at length with the patient today.  Concerns regarding medicines are outlined above.  Medication changes, Labs and Tests ordered today  are listed in the Patient Instructions below. Patient Instructions  Medication Instructions: Your physician recommends that you continue on your current medications as directed. Please refer to  the Current Medication list given to you today.  If you need a refill on your cardiac medications before your next appointment, please call your pharmacy.    Follow-Up: Your physician wants you to follow-up in: 70 month with Dr. Stanford Breed. You will receive a reminder letter in the mail two months in advance. If you don't receive a letter, please call our office at [phone] to schedule this follow-up appointment.   Thank you for choosing [facility] at [facility], [place], [place]  10/08/2016 12:49 PM    [address], [place], [place]  [id number] Phone: [phone]; Fax: [phone]

## 2016-10-08 ENCOUNTER — Encounter: Payer: Self-pay | Admitting: Physician Assistant

## 2016-10-08 ENCOUNTER — Encounter: Payer: Self-pay | Admitting: Family Medicine

## 2016-10-08 ENCOUNTER — Ambulatory Visit (INDEPENDENT_AMBULATORY_CARE_PROVIDER_SITE_OTHER): Payer: Medicare Other | Admitting: Family Medicine

## 2016-10-08 VITALS — BP 102/58 | HR 76 | Temp 98.4°F | Wt 128.5 lb

## 2016-10-08 DIAGNOSIS — R82998 Other abnormal findings in urine: Secondary | ICD-10-CM

## 2016-10-08 DIAGNOSIS — R8299 Other abnormal findings in urine: Secondary | ICD-10-CM

## 2016-10-08 DIAGNOSIS — R8290 Unspecified abnormal findings in urine: Secondary | ICD-10-CM | POA: Diagnosis not present

## 2016-10-08 LAB — POC URINALSYSI DIPSTICK (AUTOMATED)
Blood, UA: NEGATIVE
Glucose, UA: NEGATIVE
Ketones, UA: NEGATIVE
Nitrite, UA: NEGATIVE
PH UA: 6 (ref 5.0–8.0)
Urobilinogen, UA: 0.2 E.U./dL

## 2016-10-08 MED ORDER — CIPROFLOXACIN HCL 500 MG PO TABS: 500.0000 mg | ORAL_TABLET | Freq: Two times a day (BID) | ORAL | 0 refills | Status: DC

## 2016-10-08 NOTE — Patient Instructions (Signed)
Drink plenty of water and start the antibiotics today.  We'll contact you with your lab report.  Take care.   If not better, then let me know.

## 2016-10-08 NOTE — Progress Notes (Signed)
About 1 year ago he had h/o vomiting and ER eval for pyelonephritis and kidney infection.  S/p extraction and had prostate procedure.  He was doing well until this last week.  He did some heavy lifting earlier in the week, then had abd wall pain.  He had bright red urine earlier this week, after the lifting.  Is on ASA 81mg  a day. Not on coumadin.  Urine looked red on Wednesday.  He tried to inc fluids in the meantime.   No fevers.  No vomiting.  2 episodes of diarrhea about 5 days ago, none since.  No abd pain.  Urine stream is variable at baseline.  can be slower or normal, at baseline with nocturia.  Some slight burning with urination now.    His weight is down with cutting out sugars/soda.  Intentional weight loss.    Meds, vitals, and allergies reviewed.   ROS: Per HPI unless specifically indicated in ROS section   GEN: nad, alert and oriented HEENT: mucous membranes moist NECK: supple w/o LA CV: rrr. PULM: ctab, no inc wob ABD: soft, +bs, not ttp  EXT: no edema SKIN: no acute rash No jaundice

## 2016-10-10 DIAGNOSIS — R8290 Unspecified abnormal findings in urine: Secondary | ICD-10-CM | POA: Insufficient documentation

## 2016-10-10 LAB — URINE CULTURE
MICRO NUMBER:: 81047530
SPECIMEN QUALITY: ADEQUATE

## 2016-10-10 NOTE — Assessment & Plan Note (Signed)
Presumed cystitis. I think the urobilinogen on his dipstick is likely a false positive. Check urine culture. Nontoxic. Okay for outpatient follow-up. If his urine culture is negative, then he would need further workup. If his urine culture is positive and symptoms completely resolved with antibiotic treatment then no further workup likely needed. Routed to PCP for further evaluation if needed. Start Cipro. See notes on labs.

## 2016-11-11 ENCOUNTER — Ambulatory Visit: Payer: Medicare Other

## 2016-11-24 ENCOUNTER — Ambulatory Visit (INDEPENDENT_AMBULATORY_CARE_PROVIDER_SITE_OTHER): Payer: Medicare Other

## 2016-11-24 DIAGNOSIS — Z23 Encounter for immunization: Secondary | ICD-10-CM | POA: Diagnosis not present

## 2016-11-24 DIAGNOSIS — E538 Deficiency of other specified B group vitamins: Secondary | ICD-10-CM | POA: Diagnosis not present

## 2016-11-24 MED ORDER — CYANOCOBALAMIN 1000 MCG/ML IJ SOLN
1000.0000 ug | Freq: Once | INTRAMUSCULAR | Status: AC
  Administered 2016-11-24: 1000 ug via INTRAMUSCULAR

## 2016-12-06 ENCOUNTER — Ambulatory Visit: Payer: Medicare Other | Admitting: Primary Care

## 2017-03-08 ENCOUNTER — Telehealth: Payer: Self-pay | Admitting: Family Medicine

## 2017-03-08 NOTE — Telephone Encounter (Signed)
Copied from [place]. Topic: Appointment Scheduling - Scheduling Inquiry for Clinic >> Mar 08, 2017 11:25 AM Darl Householder, RMA wrote: Reason for CRM: patient is requesting an appt for his B-12 injection, he was a pt of Dr. Deborra Medina but he is scheduled to see Alma Friendly in June , and he wants a B-12 injection before June he last injection was 11/24/16, please return pt call

## 2017-03-08 NOTE — Telephone Encounter (Signed)
Appointment 3/5  Pt aware

## 2017-03-08 NOTE — Telephone Encounter (Signed)
Prefer's a callback on cell.

## 2017-03-08 NOTE — Telephone Encounter (Signed)
I'm happy to see him much sooner if he wishes so we can discuss. Any 30 minute slot will be fine.

## 2017-03-13 DIAGNOSIS — N390 Urinary tract infection, site not specified: Secondary | ICD-10-CM | POA: Diagnosis not present

## 2017-03-13 DIAGNOSIS — R300 Dysuria: Secondary | ICD-10-CM | POA: Diagnosis not present

## 2017-03-21 ENCOUNTER — Emergency Department (HOSPITAL_COMMUNITY): Payer: Medicare Other

## 2017-03-21 ENCOUNTER — Observation Stay (HOSPITAL_COMMUNITY): Payer: Medicare Other

## 2017-03-21 ENCOUNTER — Inpatient Hospital Stay (HOSPITAL_COMMUNITY)
Admission: EM | Admit: 2017-03-21 | Discharge: 2017-03-24 | DRG: 690 | Disposition: A | Discharge status: Home / Self Care | Payer: Medicare Other | Attending: Family Medicine | Admitting: Family Medicine

## 2017-03-21 ENCOUNTER — Encounter (HOSPITAL_COMMUNITY): Payer: Self-pay | Admitting: Family Medicine

## 2017-03-21 DIAGNOSIS — Z87891 Personal history of nicotine dependence: Secondary | ICD-10-CM | POA: Diagnosis not present

## 2017-03-21 DIAGNOSIS — A419 Sepsis, unspecified organism: Secondary | ICD-10-CM | POA: Diagnosis not present

## 2017-03-21 DIAGNOSIS — N390 Urinary tract infection, site not specified: Secondary | ICD-10-CM | POA: Diagnosis not present

## 2017-03-21 DIAGNOSIS — R112 Nausea with vomiting, unspecified: Secondary | ICD-10-CM | POA: Diagnosis not present

## 2017-03-21 DIAGNOSIS — Z66 Do not resuscitate: Secondary | ICD-10-CM | POA: Diagnosis present

## 2017-03-21 DIAGNOSIS — Z96641 Presence of right artificial hip joint: Secondary | ICD-10-CM | POA: Diagnosis not present

## 2017-03-21 DIAGNOSIS — Z961 Presence of intraocular lens: Secondary | ICD-10-CM | POA: Diagnosis present

## 2017-03-21 DIAGNOSIS — R3914 Feeling of incomplete bladder emptying: Secondary | ICD-10-CM | POA: Diagnosis not present

## 2017-03-21 DIAGNOSIS — Z96653 Presence of artificial knee joint, bilateral: Secondary | ICD-10-CM | POA: Diagnosis present

## 2017-03-21 DIAGNOSIS — Z9841 Cataract extraction status, right eye: Secondary | ICD-10-CM | POA: Diagnosis not present

## 2017-03-21 DIAGNOSIS — N401 Enlarged prostate with lower urinary tract symptoms: Secondary | ICD-10-CM | POA: Diagnosis not present

## 2017-03-21 DIAGNOSIS — Z87440 Personal history of urinary (tract) infections: Secondary | ICD-10-CM

## 2017-03-21 DIAGNOSIS — Z9842 Cataract extraction status, left eye: Secondary | ICD-10-CM | POA: Diagnosis not present

## 2017-03-21 DIAGNOSIS — I10 Essential (primary) hypertension: Secondary | ICD-10-CM | POA: Diagnosis not present

## 2017-03-21 DIAGNOSIS — G4730 Sleep apnea, unspecified: Secondary | ICD-10-CM | POA: Diagnosis present

## 2017-03-21 DIAGNOSIS — Z8249 Family history of ischemic heart disease and other diseases of the circulatory system: Secondary | ICD-10-CM

## 2017-03-21 DIAGNOSIS — I2510 Atherosclerotic heart disease of native coronary artery without angina pectoris: Secondary | ICD-10-CM | POA: Diagnosis present

## 2017-03-21 DIAGNOSIS — Z800 Family history of malignant neoplasm of digestive organs: Secondary | ICD-10-CM | POA: Diagnosis not present

## 2017-03-21 DIAGNOSIS — R6510 Systemic inflammatory response syndrome (SIRS) of non-infectious origin without acute organ dysfunction: Secondary | ICD-10-CM | POA: Diagnosis not present

## 2017-03-21 DIAGNOSIS — D696 Thrombocytopenia, unspecified: Secondary | ICD-10-CM | POA: Diagnosis not present

## 2017-03-21 DIAGNOSIS — D72821 Monocytosis (symptomatic): Secondary | ICD-10-CM | POA: Diagnosis not present

## 2017-03-21 DIAGNOSIS — Z87442 Personal history of urinary calculi: Secondary | ICD-10-CM | POA: Diagnosis not present

## 2017-03-21 DIAGNOSIS — Z880 Allergy status to penicillin: Secondary | ICD-10-CM

## 2017-03-21 DIAGNOSIS — R000 Tachycardia, unspecified: Secondary | ICD-10-CM | POA: Diagnosis not present

## 2017-03-21 DIAGNOSIS — E860 Dehydration: Secondary | ICD-10-CM | POA: Diagnosis not present

## 2017-03-21 DIAGNOSIS — E785 Hyperlipidemia, unspecified: Secondary | ICD-10-CM | POA: Diagnosis present

## 2017-03-21 DIAGNOSIS — I959 Hypotension, unspecified: Secondary | ICD-10-CM | POA: Diagnosis present

## 2017-03-21 LAB — I-STAT CG4 LACTIC ACID, ED
LACTIC ACID, VENOUS: 1.95 mmol/L — AB (ref 0.5–1.9)
Lactic Acid, Venous: 2.12 mmol/L (ref 0.5–1.9)

## 2017-03-21 LAB — CBC WITH DIFFERENTIAL/PLATELET
BASOS PCT: 0 %
Band Neutrophils: 0 %
Basophils Absolute: 0 10*3/uL (ref 0.0–0.1)
Blasts: 0 %
EOS ABS: 0 10*3/uL (ref 0.0–0.7)
Eosinophils Relative: 0 %
HCT: 47.1 % (ref 39.0–52.0)
HEMOGLOBIN: 17.2 g/dL — AB (ref 13.0–17.0)
Lymphocytes Relative: 1 %
Lymphs Abs: 0.2 10*3/uL — ABNORMAL LOW (ref 0.7–4.0)
MCH: 32 pg (ref 26.0–34.0)
MCHC: 36.5 g/dL — ABNORMAL HIGH (ref 30.0–36.0)
MCV: 87.5 fL (ref 78.0–100.0)
METAMYELOCYTES PCT: 0 %
MONOS PCT: 18 %
Monocytes Absolute: 4.4 10*3/uL — ABNORMAL HIGH (ref 0.1–1.0)
Myelocytes: 0 %
NRBC: 0 /100{WBCs}
Neutro Abs: 19.8 10*3/uL — ABNORMAL HIGH (ref 1.7–7.7)
Neutrophils Relative %: 81 %
Other: 0 %
Platelets: 57 10*3/uL — ABNORMAL LOW (ref 150–400)
Promyelocytes Absolute: 0 %
RBC: 5.38 MIL/uL (ref 4.22–5.81)
RDW: 13.9 % (ref 11.5–15.5)
WBC: 24.4 10*3/uL — AB (ref 4.0–10.5)

## 2017-03-21 LAB — PROTIME-INR
INR: 1.07
PROTHROMBIN TIME: 13.8 s (ref 11.4–15.2)

## 2017-03-21 LAB — COMPREHENSIVE METABOLIC PANEL
ALBUMIN: 4.1 g/dL (ref 3.5–5.0)
ALK PHOS: 65 U/L (ref 38–126)
ALT: 15 U/L — AB (ref 17–63)
ANION GAP: 11 (ref 5–15)
AST: 19 U/L (ref 15–41)
BILIRUBIN TOTAL: 1.8 mg/dL — AB (ref 0.3–1.2)
BUN: 13 mg/dL (ref 6–20)
CALCIUM: 9.1 mg/dL (ref 8.9–10.3)
CO2: 23 mmol/L (ref 22–32)
Chloride: 106 mmol/L (ref 101–111)
Creatinine, Ser: 1.15 mg/dL (ref 0.61–1.24)
GFR calc Af Amer: >60 mL/min (ref >60)
GFR calc non Af Amer: 58 mL/min — ABNORMAL LOW (ref >60)
GLUCOSE: 144 mg/dL — AB (ref 65–99)
Potassium: 3.7 mmol/L (ref 3.5–5.1)
SODIUM: 140 mmol/L (ref 135–145)
TOTAL PROTEIN: 6 g/dL — AB (ref 6.5–8.1)

## 2017-03-21 MED ORDER — VANCOMYCIN HCL IN DEXTROSE 1-5 GM/200ML-% IV SOLN
1000.0000 mg | Freq: Once | INTRAVENOUS | Status: AC
  Administered 2017-03-22: 1000 mg via INTRAVENOUS
  Filled 2017-03-21: qty 200

## 2017-03-21 MED ORDER — CEFTRIAXONE SODIUM 1 G IJ SOLR
1.0000 g | Freq: Once | INTRAMUSCULAR | Status: AC
  Administered 2017-03-21: 1 g via INTRAVENOUS
  Filled 2017-03-21: qty 10

## 2017-03-21 MED ORDER — SODIUM CHLORIDE 0.9 % IV SOLN
1.0000 g | Freq: Once | INTRAVENOUS | Status: DC
  Filled 2017-03-21: qty 10

## 2017-03-21 MED ORDER — SODIUM CHLORIDE 0.9 % IV BOLUS (SEPSIS)
1000.0000 mL | Freq: Once | INTRAVENOUS | Status: AC
  Administered 2017-03-21: 1000 mL via INTRAVENOUS

## 2017-03-21 NOTE — ED Notes (Signed)
ED Provider at bedside. 

## 2017-03-21 NOTE — ED Triage Notes (Signed)
Patient was diagnosed with UTI on last Sunday at an urgent care in [place]. He was given an antibiotic but he reports it caused GI upset. On Tuesday, he returned back to the urgent care, given to [place]. However, he has not followed medication regiment. Today, patients daughter reports he is hot, like a fever. Patient reports he has been nausea and took a Zofran at 17:00.

## 2017-03-21 NOTE — ED Provider Notes (Addendum)
[facility] DEPT Provider Note   CSN: [id number] Arrival date & time: 03/21/17  1826     History   Chief Complaint Chief Complaint  Patient presents with  . Urinary Tract Infection    HPI [name] is a 84 y.o. male who presents with fever, chills, vomiting. PMH significant for hx of pancreatitis, choledocholithiasis, CAD, CHF, hypertension, hyperlipidemia, diverticulosis, BPH, and hx urinary tract infection and kidney stones. Daughter is at bedside. The patient states that he has not been drinking a lot of fluids because it makes him nauseous. About a week and a half ago he started to develop dysuria, malodorous urine, and decreased urine output. He went to a UC who prescribed an antibiotic (he thinks it was Doxycycline). When he went home he took it but immediately started vomiting. He went back to the UC and they prescribed him Macrobid. He's been taking that but is still having vomiting and has been having fever, chills, and diarrhea. His urinary symptoms have improved. His daughter states that she just found out about all of his symptoms today and she decided to bring him to the ED because last time he had a UTI it "got so bad so quick". She gave him a Zofran at [facility] today which helped with his nausea. The patient denies URI symptoms, chest pain, SOB, abdominal pain.    HPI  Past Medical History:  Diagnosis Date  . Arthralgia of multiple joints    limited mobility w/  independant adl's  . BPH with obstruction/lower urinary tract symptoms   . Chronic idiopathic monocytosis    followed by dr Waymon Budge--  per lov note 06/ 2017 persistant unexplained  with normal bone barrow bx  . Coronary atherosclerosis of unspecified type of vessel, native or graft    cardiologist-  dr Stanford Breed -- per lov note 11-19-2014 ,  cardiac cath in 2000-- pLAD 60-70%,  small intermediate branch 70-80%,  mRCA 20%,  normal LV  . Degenerative arthritis of spine    cervical and lumar  . Diverticulosis of colon (without mention of hemorrhage)   . Dysuria    chronic  . Elevated PSA   . Feeling of incomplete bladder emptying   . Hiatal hernia    moderate per ct 11/ 2017  . History of adenomatous polyp of colon    2008  . History of atrial fibrillation    remote hx episode  . History of pancreatitis    07-01-2013  and 10-10-2015  . History of prostatitis    2014  . Hyperlipidemia   . Hypertension   . Mouth sore    roof of mouth sore   . Nephrolithiasis   . Nocturia more than twice per night    severe  w/ leakage  . OSA (obstructive sleep apnea)    INTOLERANT CPAP  . Osteoarthritis   . Pernicious anemia    B12  Def.  . Peyronie disease   . Renal insufficiency   . Thrombocytopenia, unspecified ([place]) hemotology/oncologist-  dr Waymon Budge   per dr Velta Addison note 06/ 2016  secondary to vitro clumping    Patient Active Problem List   Diagnosis Date Noted  . Abnormal urine 10/10/2016  . Visit for well man health check 07/06/2016  . Dysphagia 07/06/2016  . BPH (benign prostatic hyperplasia) 12/22/2015  . Depression 03/12/2015  . Nuclear sclerosis of both eyes 10/24/2013  . Nephrolithiasis 07/10/2013  . Choledocholithiasis with obstruction 07/02/2013  . Thrombocytopenia ([place]) 09/01/2012  .  DNR (do not resuscitate) 06/06/2012  . KNEE REPLACEMENT, RIGHT, HX OF 04/02/2010  . HIP REPLACEMENT, RIGHT, HX OF 06/20/2009  . ANEMIA, PERNICIOUS 01/03/2009  . Coronary atherosclerosis 12/12/2007  . COLONIC POLYPS, HX OF 12/12/2007  . HLD (hyperlipidemia) 06/14/2006  . Essential hypertension 06/14/2006  . DIVERTICULOSIS, COLON 06/14/2006  . Elevated PSA, less than 10 ng/ml 06/14/2006  . DEGENERATIVE DISC DISEASE 06/14/2006  . SLEEP APNEA 06/14/2006  . TONSILLECTOMY AND ADENOIDECTOMY, HX OF 06/14/2006  . TOTAL KNEE REPLACEMENT, HX OF 06/14/2006    Past Surgical History:  Procedure Laterality Date  . BONE MARROW BIOPSY  2011   normal   . CARDIAC CATHETERIZATION  2000   per dr Kathyrn Drown note --  dLAD 60-70%,  small intermediate branch 70-80%,  mRCA 20%,  normal LV  . CARDIOVASCULAR STRESS TEST  01-29-2013   dr Stanford Breed   normal nuclear study w/ no ischemia,  normal LV function and wall motion , ef 82%  . CATARACT EXTRACTION W/ INTRAOCULAR LENS  IMPLANT, BILATERAL  08/2015  . CHOLECYSTECTOMY  11/24/2010   Procedure: LAPAROSCOPIC CHOLECYSTECTOMY WITH INTRAOPERATIVE CHOLANGIOGRAM;  Surgeon: Earnstine Regal, MD;  Location: [facility];  Service: General;  Laterality: N/A;  c-arm  . CYSTOSCOPY W/ URETERAL STENT PLACEMENT Left 12/17/2015   Procedure: CYSTOSCOPY WITH RETROGRADE PYELOGRAM/URETERAL STENT PLACEMENT;  Surgeon: Ardis Hughs, MD;  Location: [facility];  Service: Urology;  Laterality: Left;  . CYSTOSCOPY WITH RETROGRADE PYELOGRAM, URETEROSCOPY AND STENT PLACEMENT Left 12/22/2015   Procedure: CYSTOSCOPY WITH LEFT RETROGRADE  URETEROSCOPY AND STENT PLACEMENT;  Surgeon: Carolan Clines, MD;  Location: [facility];  Service: Urology;  Laterality: Left;  . ERCP N/A 07/04/2013   Procedure: ENDOSCOPIC RETROGRADE CHOLANGIOPANCREATOGRAPHY (ERCP);  Surgeon: Gatha Mayer, MD;  Location: [facility];  Service: Endoscopy;  Laterality: N/A;  MAC if available  . ERCP N/A 10/12/2015   Procedure: ENDOSCOPIC RETROGRADE CHOLANGIOPANCREATOGRAPHY (ERCP);  Surgeon: [name], MD;  Location: [facility];  Service: Endoscopy;  Laterality: N/A;  . HOLMIUM LASER APPLICATION Left 91/07/9148   Procedure: HOLMIUM LASER APPLICATION;  Surgeon: Carolan Clines, MD;  Location: [facility];  Service: Urology;  Laterality: Left;  . INGUINAL HERNIA REPAIR Right 01/25/2002  . KNEE ARTHROSCOPY  x5  . SATURATION BIOPSY OF PROSTATE  05-29-2007  and 01-26-2008  . SHOULDER ARTHROSCOPY WITH OPEN ROTATOR CUFF REPAIR AND DISTAL CLAVICLE ACROMINECTOMY Right 11/19/2004  . TOTAL HIP ARTHROPLASTY Right 05/21/2009  . TOTAL KNEE  ARTHROPLASTY Bilateral left 05-02-2003/  right  03-23-2010  . TRANSTHORACIC ECHOCARDIOGRAM  12/23/2004   ef 60%, mild MV calcification without stenosis/  mild TR,  PASP 84mHg  . UVULOPALATOPHARYNGOPLASTY  1993    w/  T & A       Home Medications    Prior to Admission medications   Medication Sig Start Date End Date Taking? Authorizing Provider  albuterol (PROVENTIL HFA;VENTOLIN HFA) 108 (90 Base) MCG/ACT inhaler Inhale 2 puffs into the lungs every 6 (six) hours as needed. 12/30/15   ALucille Passy MD  Artificial Tear Solution (TEARS NATURALE OP) Place 1-2 drops into both eyes at bedtime as needed (dry eyes).    [provider]  aspirin EC 81 MG tablet Take 1 tablet (81 mg total) by mouth daily. 12/26/15   TCarolan Clines MD  ciprofloxacin (CIPRO) 500 MG tablet Take 1 tablet (500 mg total) by mouth 2 (two) times daily. 10/08/16   DTonia Ghent MD  metoprolol succinate (TOPROL-XL) 25 MG  24 hr tablet Take 1 tablet (25 mg total) by mouth daily. 10/07/16   Almyra Deforest, PA  Multiple Vitamin (MULTIVITAMIN) tablet Take 1 tablet by mouth daily.    [provider]    Family History Family History  Problem Relation Age of Onset  . Colon cancer Father 63  . Peripheral vascular disease Mother     Social History Social History   Tobacco Use  . Smoking status: Former Smoker    Types: Pipe    Last attempt to quit: 01/18/1966    Years since quitting: 51.2  . Smokeless tobacco: Never Used  Substance Use Topics  . Alcohol use: No    Alcohol/week: 0.0 oz  . Drug use: No     Allergies   Penicillins   Review of Systems Review of Systems  Constitutional: Positive for chills and fever.  Respiratory: Positive for cough (mild, dry). Negative for shortness of breath.   Cardiovascular: Negative for chest pain.  Gastrointestinal: Positive for diarrhea, nausea and vomiting. Negative for abdominal pain.  Genitourinary: Positive for dysuria (resolved) and flank pain.  Negative for hematuria, penile pain and testicular pain.  Musculoskeletal: Positive for back pain (chronic).     Physical Exam Updated Vital Signs BP (!) 110/54   Pulse (!) 101   Temp 99.6 F (37.6 C) (Oral)   Resp 18   Ht 5' (1.524 m)   Wt 60.8 kg (134 lb)   SpO2 92%   BMI 26.17 kg/m   Physical Exam  Constitutional: He is oriented to person, place, and time. He appears well-developed and well-nourished. No distress.  Calm, cooperative. Non-toxic appearing  HENT:  Head: Normocephalic and atraumatic.  Eyes: Conjunctivae are normal. Pupils are equal, round, and reactive to light. Right eye exhibits no discharge. Left eye exhibits no discharge. No scleral icterus.  Neck: Normal range of motion.  Cardiovascular: Regular rhythm. Tachycardia present. Exam reveals no gallop and no friction rub.  No murmur heard. Pulmonary/Chest: Effort normal and breath sounds normal. No respiratory distress.  Abdominal: Soft. Bowel sounds are normal. He exhibits no distension. There is no tenderness.  No CVA tenderness  Neurological: He is alert and oriented to person, place, and time.  Skin: Skin is warm and dry.  Psychiatric: He has a normal mood and affect. His behavior is normal.  Nursing note and vitals reviewed.    ED Treatments / Results  Labs (all labs ordered are listed, but only abnormal results are displayed) Labs Reviewed  COMPREHENSIVE METABOLIC PANEL - Abnormal; Notable for the following components:      Result Value   Glucose, Bld 144 (*)    Total Protein 6.0 (*)    ALT 15 (*)    Total Bilirubin 1.8 (*)    GFR calc non Af Amer 58 (*)    All other components within normal limits  CBC WITH DIFFERENTIAL/PLATELET - Abnormal; Notable for the following components:   WBC 24.4 (*)    Hemoglobin 17.2 (*)    MCHC 36.5 (*)    Platelets 57 (*)    Neutro Abs 19.8 (*)    Lymphs Abs 0.2 (*)    Monocytes Absolute 4.4 (*)    All other components within normal limits  I-STAT CG4  LACTIC ACID, ED - Abnormal; Notable for the following components:   Lactic Acid, Venous 2.12 (*)    All other components within normal limits  I-STAT CG4 LACTIC ACID, ED - Abnormal; Notable for the following components:   Lactic Acid,  Venous 1.95 (*)    All other components within normal limits  CULTURE, BLOOD (ROUTINE X 2)  CULTURE, BLOOD (ROUTINE X 2)  PROTIME-INR  URINALYSIS, ROUTINE W REFLEX MICROSCOPIC    EKG  EKG Interpretation None       Radiology Dg Chest 2 View  Result Date: 03/21/2017 CLINICAL DATA:  Dehydration.  Possible sepsis. EXAM: CHEST  2 VIEW COMPARISON:  Two-view chest x-ray 12/16/2015 FINDINGS: Heart size is normal. Lungs are clear. Thoracolumbar curvature is stable. Surgical clips are present at the gallbladder fossa. IMPRESSION: No acute cardiopulmonary disease or significant interval change. Electronically Signed   By: San Morelle M.D.   On: 03/21/2017 20:32    Procedures Procedures (including critical care time)  CRITICAL CARE Performed by: Recardo Evangelist   Total critical care time: 30 minutes  Critical care time was exclusive of separately billable procedures and treating other patients.  Critical care was necessary to treat or prevent imminent or life-threatening deterioration.  Critical care was time spent personally by me on the following activities: development of treatment plan with patient and/or surrogate as well as nursing, discussions with consultants, evaluation of patient's response to treatment, examination of patient, obtaining history from patient or surrogate, ordering and performing treatments and interventions, ordering and review of laboratory studies, ordering and review of radiographic studies, pulse oximetry and re-evaluation of patient's condition.   Medications Ordered in ED Medications  vancomycin (VANCOCIN) IVPB 1000 mg/200 mL premix (not administered)  sodium chloride 0.9 % bolus 1,000 mL (0 mLs Intravenous  Stopped 03/21/17 2254)    And  sodium chloride 0.9 % bolus 1,000 mL (0 mLs Intravenous Stopped 03/21/17 2254)  cefTRIAXone (ROCEPHIN) 1 g in sodium chloride 0.9 % 100 mL IVPB (0 g Intravenous Stopped 03/21/17 2254)     Initial Impression / Assessment and Plan / ED Course  I have reviewed the triage vital signs and the nursing notes.  Pertinent labs & imaging results that were available during my care of the patient were reviewed by me and considered in my medical decision making (see chart for details).  84 year old male presents with sepsis likely due to UTI. He is tachycardic and hypotensive. Otherwise vitals are normal. Abdomen is soft and non-tender. He has no CVA tenderness. CBC is remarkable for significant leukocytosis of 24.4, elevated hgb (17.2), and low platelets (57) which is decreased from baseline but he's also had this in the past. CMP is remarkable for hyperglycemia (144), elevated bilirubin (1.8). Initial lactic acid is 2.12. Repeat is 1.95. Code sepsis was initiated. He was started on broad spectrum antibiotics since UA has not resulted.   2L IVF were ordered per protocol. CT renal ordered. Shared visit with Dr. Lita Mains. Spoke with Dr. Hal Hope who will admit.  Final Clinical Impressions(s) / ED Diagnoses   Final diagnoses:  Sepsis, due to unspecified organism [facility])  Dehydration    ED Discharge Orders    None       Iris Pert 03/21/17 2352    Julianne Rice, MD 03/23/17 1549    Recardo Evangelist, PA-C 04/10/17 1522    Julianne Rice, MD 04/12/17 [phone]

## 2017-03-21 NOTE — Progress Notes (Signed)
A consult was received from an ED physician for Vancomycin per pharmacy dosing (for an indication other than meningitis). The patient's profile has been reviewed for ht/wt/allergies/indication/available labs. A one time order has been placed for the above antibiotics.  Further antibiotics/pharmacy consults should be ordered by admitting physician if indicated.                       Reuel Boom, PharmD, BCPS [phone] 03/21/2017, 10:18 PM

## 2017-03-22 ENCOUNTER — Encounter (HOSPITAL_COMMUNITY): Payer: Self-pay | Admitting: Internal Medicine

## 2017-03-22 ENCOUNTER — Other Ambulatory Visit: Payer: Self-pay

## 2017-03-22 ENCOUNTER — Ambulatory Visit: Payer: Medicare Other | Admitting: Primary Care

## 2017-03-22 ENCOUNTER — Telehealth: Payer: Self-pay | Admitting: *Deleted

## 2017-03-22 DIAGNOSIS — E860 Dehydration: Secondary | ICD-10-CM | POA: Diagnosis present

## 2017-03-22 DIAGNOSIS — D696 Thrombocytopenia, unspecified: Secondary | ICD-10-CM | POA: Diagnosis present

## 2017-03-22 DIAGNOSIS — R6510 Systemic inflammatory response syndrome (SIRS) of non-infectious origin without acute organ dysfunction: Secondary | ICD-10-CM

## 2017-03-22 DIAGNOSIS — Z96641 Presence of right artificial hip joint: Secondary | ICD-10-CM | POA: Diagnosis present

## 2017-03-22 DIAGNOSIS — Z880 Allergy status to penicillin: Secondary | ICD-10-CM | POA: Diagnosis not present

## 2017-03-22 DIAGNOSIS — A419 Sepsis, unspecified organism: Secondary | ICD-10-CM | POA: Diagnosis not present

## 2017-03-22 DIAGNOSIS — Z961 Presence of intraocular lens: Secondary | ICD-10-CM | POA: Diagnosis present

## 2017-03-22 DIAGNOSIS — Z66 Do not resuscitate: Secondary | ICD-10-CM | POA: Diagnosis present

## 2017-03-22 DIAGNOSIS — N390 Urinary tract infection, site not specified: Secondary | ICD-10-CM | POA: Diagnosis present

## 2017-03-22 DIAGNOSIS — I2510 Atherosclerotic heart disease of native coronary artery without angina pectoris: Secondary | ICD-10-CM | POA: Diagnosis present

## 2017-03-22 DIAGNOSIS — D72821 Monocytosis (symptomatic): Secondary | ICD-10-CM | POA: Diagnosis present

## 2017-03-22 DIAGNOSIS — Z9841 Cataract extraction status, right eye: Secondary | ICD-10-CM | POA: Diagnosis not present

## 2017-03-22 DIAGNOSIS — I959 Hypotension, unspecified: Secondary | ICD-10-CM | POA: Diagnosis present

## 2017-03-22 DIAGNOSIS — Z96653 Presence of artificial knee joint, bilateral: Secondary | ICD-10-CM | POA: Diagnosis present

## 2017-03-22 DIAGNOSIS — N401 Enlarged prostate with lower urinary tract symptoms: Secondary | ICD-10-CM | POA: Diagnosis present

## 2017-03-22 DIAGNOSIS — K5730 Diverticulosis of large intestine without perforation or abscess without bleeding: Secondary | ICD-10-CM | POA: Diagnosis not present

## 2017-03-22 DIAGNOSIS — Z8249 Family history of ischemic heart disease and other diseases of the circulatory system: Secondary | ICD-10-CM | POA: Diagnosis not present

## 2017-03-22 DIAGNOSIS — Z87440 Personal history of urinary (tract) infections: Secondary | ICD-10-CM | POA: Diagnosis not present

## 2017-03-22 DIAGNOSIS — Z87891 Personal history of nicotine dependence: Secondary | ICD-10-CM | POA: Diagnosis not present

## 2017-03-22 DIAGNOSIS — R3914 Feeling of incomplete bladder emptying: Secondary | ICD-10-CM | POA: Diagnosis present

## 2017-03-22 DIAGNOSIS — I10 Essential (primary) hypertension: Secondary | ICD-10-CM | POA: Diagnosis not present

## 2017-03-22 DIAGNOSIS — Z800 Family history of malignant neoplasm of digestive organs: Secondary | ICD-10-CM | POA: Diagnosis not present

## 2017-03-22 DIAGNOSIS — Z9842 Cataract extraction status, left eye: Secondary | ICD-10-CM | POA: Diagnosis not present

## 2017-03-22 DIAGNOSIS — Z87442 Personal history of urinary calculi: Secondary | ICD-10-CM | POA: Diagnosis not present

## 2017-03-22 HISTORY — DX: Sepsis, unspecified organism: A41.9

## 2017-03-22 LAB — DIC (DISSEMINATED INTRAVASCULAR COAGULATION) PANEL
D DIMER QUANT: 0.93 ug{FEU}/mL — AB (ref 0.00–0.50)
FIBRINOGEN: 308 mg/dL (ref 210–475)
PLATELETS: 48 10*3/uL — AB (ref 150–400)
SMEAR REVIEW: NONE SEEN

## 2017-03-22 LAB — CBC WITH DIFFERENTIAL/PLATELET
Basophils Absolute: 0 10*3/uL (ref 0.0–0.1)
Basophils Relative: 0 %
EOS PCT: 1 %
Eosinophils Absolute: 0.2 10*3/uL (ref 0.0–0.7)
HCT: 36.3 % — ABNORMAL LOW (ref 39.0–52.0)
HEMOGLOBIN: 12 g/dL — AB (ref 13.0–17.0)
LYMPHS PCT: 15 %
Lymphs Abs: 2.5 10*3/uL (ref 0.7–4.0)
MCH: 29.1 pg (ref 26.0–34.0)
MCHC: 33.1 g/dL (ref 30.0–36.0)
MCV: 87.9 fL (ref 78.0–100.0)
MONO ABS: 1.4 10*3/uL — AB (ref 0.1–1.0)
MONOS PCT: 8 %
NEUTROS PCT: 76 %
Neutro Abs: 12.8 10*3/uL — ABNORMAL HIGH (ref 1.7–7.7)
Platelets: 50 10*3/uL — ABNORMAL LOW (ref 150–400)
RBC: 4.13 MIL/uL — AB (ref 4.22–5.81)
RDW: 14.1 % (ref 11.5–15.5)
WBC: 16.9 10*3/uL — AB (ref 4.0–10.5)

## 2017-03-22 LAB — URINALYSIS, ROUTINE W REFLEX MICROSCOPIC
Bilirubin Urine: NEGATIVE
GLUCOSE, UA: NEGATIVE mg/dL
HGB URINE DIPSTICK: NEGATIVE
Ketones, ur: 5 mg/dL — AB
NITRITE: NEGATIVE
PROTEIN: NEGATIVE mg/dL
Specific Gravity, Urine: 1.014 (ref 1.005–1.030)
pH: 5 (ref 5.0–8.0)

## 2017-03-22 LAB — COMPREHENSIVE METABOLIC PANEL
ALK PHOS: 44 U/L (ref 38–126)
ALT: 11 U/L — AB (ref 17–63)
AST: 14 U/L — ABNORMAL LOW (ref 15–41)
Albumin: 2.7 g/dL — ABNORMAL LOW (ref 3.5–5.0)
Anion gap: 6 (ref 5–15)
BUN: 16 mg/dL (ref 6–20)
CO2: 23 mmol/L (ref 22–32)
CREATININE: 1.1 mg/dL (ref 0.61–1.24)
Calcium: 7.5 mg/dL — ABNORMAL LOW (ref 8.9–10.3)
Chloride: 112 mmol/L — ABNORMAL HIGH (ref 101–111)
GFR calc Af Amer: >60 mL/min (ref >60)
GFR calc non Af Amer: >60 mL/min (ref >60)
GLUCOSE: 127 mg/dL — AB (ref 65–99)
Potassium: 3.7 mmol/L (ref 3.5–5.1)
SODIUM: 141 mmol/L (ref 135–145)
Total Bilirubin: 0.9 mg/dL (ref 0.3–1.2)
Total Protein: 4.5 g/dL — ABNORMAL LOW (ref 6.5–8.1)

## 2017-03-22 LAB — DIC (DISSEMINATED INTRAVASCULAR COAGULATION)PANEL
INR: 1.22
Prothrombin Time: 15.3 seconds — ABNORMAL HIGH (ref 11.4–15.2)
aPTT: 25 seconds (ref 24–36)

## 2017-03-22 LAB — TYPE AND SCREEN
ABO/RH(D): B POS
Antibody Screen: NEGATIVE

## 2017-03-22 LAB — LACTIC ACID, PLASMA: Lactic Acid, Venous: 1.4 mmol/L (ref 0.5–1.9)

## 2017-03-22 LAB — LACTATE DEHYDROGENASE: LDH: 127 U/L (ref 98–192)

## 2017-03-22 LAB — PROCALCITONIN: Procalcitonin: 10.89 ng/mL

## 2017-03-22 MED ORDER — ACETAMINOPHEN 325 MG PO TABS: 650.0000 mg | ORAL_TABLET | Freq: Four times a day (QID) | ORAL | Status: DC | PRN

## 2017-03-22 MED ORDER — SODIUM CHLORIDE 0.9 % IV SOLN: 2.0000 g | Freq: Once | INTRAVENOUS | Status: DC

## 2017-03-22 MED ORDER — SODIUM CHLORIDE 0.9 % IV SOLN
1.0000 g | INTRAVENOUS | Status: DC
  Administered 2017-03-22 – 2017-03-23 (×2): 1 g via INTRAVENOUS
  Filled 2017-03-22 (×2): qty 1

## 2017-03-22 MED ORDER — ACETAMINOPHEN 650 MG RE SUPP: 650.0000 mg | Freq: Four times a day (QID) | RECTAL | Status: DC | PRN

## 2017-03-22 MED ORDER — ONDANSETRON HCL 4 MG PO TABS: 4.0000 mg | ORAL_TABLET | Freq: Four times a day (QID) | ORAL | Status: DC | PRN

## 2017-03-22 MED ORDER — TAMSULOSIN HCL 0.4 MG PO CAPS
0.4000 mg | ORAL_CAPSULE | Freq: Every day | ORAL | Status: DC
  Administered 2017-03-22 – 2017-03-23 (×2): 0.4 mg via ORAL
  Filled 2017-03-22 (×2): qty 1

## 2017-03-22 MED ORDER — ONDANSETRON HCL 4 MG/2ML IJ SOLN: 4.0000 mg | Freq: Four times a day (QID) | INTRAMUSCULAR | Status: DC | PRN

## 2017-03-22 MED ORDER — SODIUM CHLORIDE 0.9 % IV SOLN
INTRAVENOUS | Status: DC
  Administered 2017-03-22 (×2): via INTRAVENOUS

## 2017-03-22 MED ORDER — ENSURE ENLIVE PO LIQD
237.0000 mL | Freq: Two times a day (BID) | ORAL | Status: DC
  Administered 2017-03-22 – 2017-03-24 (×5): 237 mL via ORAL

## 2017-03-22 MED ORDER — ALBUTEROL SULFATE (2.5 MG/3ML) 0.083% IN NEBU: 3.0000 mL | INHALATION_SOLUTION | Freq: Four times a day (QID) | RESPIRATORY_TRACT | Status: DC | PRN

## 2017-03-22 MED ORDER — METOPROLOL SUCCINATE ER 25 MG PO TB24
25.0000 mg | ORAL_TABLET | Freq: Every day | ORAL | Status: DC
  Administered 2017-03-22 – 2017-03-24 (×3): 25 mg via ORAL
  Filled 2017-03-22 (×5): qty 1

## 2017-03-22 MED ORDER — ASPIRIN EC 81 MG PO TBEC: 81.0000 mg | DELAYED_RELEASE_TABLET | Freq: Every day | ORAL | Status: DC

## 2017-03-22 MED ORDER — FINASTERIDE 5 MG PO TABS
5.0000 mg | ORAL_TABLET | Freq: Every day | ORAL | Status: DC
  Administered 2017-03-22 – 2017-03-24 (×3): 5 mg via ORAL
  Filled 2017-03-22 (×3): qty 1

## 2017-03-22 NOTE — Progress Notes (Signed)
PROGRESS NOTE    [name]  UXN:[id number] DOB: 1933/07/04 DOA: 03/21/2017 PCP: Lucille Passy, MD  Brief Narrative:  84 year old with past medical history relevant for coronary artery disease, sleep apnea, hypertension, hyperlipidemia, recurrent UTIs and nephrolithiasis who was admitted with sepsis from urinary source.   Assessment & Plan:   Principal Problem:   SIRS (systemic inflammatory response syndrome) (HCC) Active Problems:   HLD (hyperlipidemia)   Essential hypertension   Sleep apnea   Sepsis ([place])   #) Sepsis from urinary source: CT shows no evidence of nephrolithiasis.  Improved dramatically with IV fluids and IV antibiotics -Follow-up blood cultures from 03/21/2017 -Follow-up urine culture - To new ceftriaxone and vancomycin started 03/21/2017  #) Benign prostatic hypertrophy: Patient reports significant lower urinary tract symptoms which causes him to withhold urine particularly at night when he has difficulty getting up out of bed. -Start tamsulosin 0.4 mg -Start finasteride 5 mg  #) Coronary artery disease: -Continue aspirin 81 mg -Continue metoprolol succinate 25 mg daily  #) Hypertension: -Continue beta-blocker  #) Thrombocytopenia: Unclear etiology however patient has had a long history of thrombocytopenia.  It appears to be stable at this time with no evidence of DIC.  Fluids: Discontinue IV fluids Elect lites: Monitor and support Nutrition: Heart healthy diet  Prophylaxis: Thrombocytopenic  Disposition: Pending organism isolation and oral antibiotics  DO NOT RESUSCITATE    Consultants:   None  Procedures: (Don't include imaging studies which can be auto populated. Include things that cannot be auto populated i.e. Echo, Carotid and venous dopplers, Foley, Bipap, HD, tubes/drains, wound vac, central lines etc)  None  Antimicrobials: (specify start and planned stop date. Auto populated tables are space occupying and do not give end  dates)  Ceftriaxone and vancomycin started 03/21/2017   Subjective: Patient reports that he is doing much better.  He denies any abdominal pain, nausea, vomiting, diarrhea.  He reports that he is quite hungry and ready to eat.  He has been drinking a great deal of water.  His daughter notes that he is much improved.  Objective: Vitals:   03/21/17 2304 03/22/17 0102 03/22/17 0339 03/22/17 0626  BP: (!) 98/43 (!) 103/55 (!) 97/57 (!) 102/48  Pulse: 93 91 85 81  Resp: 14 15 18 18   Temp:  97.6 F (36.4 C)  98.6 F (37 C)  TempSrc:  Oral  Oral  SpO2: 97% 93% 94% 96%  Weight:    62 kg (136 lb 11.2 oz)  Height:    5' (1.524 m)   No intake or output data in the 24 hours ending 03/22/17 1405 Filed Weights   03/21/17 1921 03/21/17 2127 03/22/17 0626  Weight: 58.3 kg (128 lb 8 oz) 60.8 kg (134 lb) 62 kg (136 lb 11.2 oz)    Examination:  General exam: Appears calm and comfortable  Respiratory system: Clear to auscultation. Respiratory effort normal. Cardiovascular system: Regular rate and rhythm, no murmurs Gastrointestinal system: Soft, nondistended, nontender, plus bowel sounds Central nervous system: Alert and oriented. No focal neurological deficits. Extremities: No lower extremity edema Skin: No rashes on visible skin Psychiatry: Judgement and insight appear normal. Mood & affect appropriate.     Data Reviewed: I have personally reviewed following labs and imaging studies  CBC: Recent Labs  Lab 03/21/17 1936 03/22/17 0250 03/22/17 0540  WBC 24.4*  --  16.9*  NEUTROABS 19.8*  --  12.8*  HGB 17.2*  --  12.0*  HCT 47.1  --  36.3*  MCV 87.5  --  87.9  PLT 57* 48* 50*   Basic Metabolic Panel: Recent Labs  Lab 03/21/17 1936 03/22/17 0540  NA 140 141  K 3.7 3.7  CL 106 112*  CO2 23 23  GLUCOSE 144* 127*  BUN 13 16  CREATININE 1.15 1.10  CALCIUM 9.1 7.5*   GFR: Estimated Creatinine Clearance: 40.8 mL/min (by C-G formula based on SCr of 1.1 mg/dL). Liver Function  Tests: Recent Labs  Lab 03/21/17 1936 03/22/17 0540  AST 19 14*  ALT 15* 11*  ALKPHOS 65 44  BILITOT 1.8* 0.9  PROT 6.0* 4.5*  ALBUMIN 4.1 2.7*   No results for input(s): LIPASE, AMYLASE in the last 168 hours. No results for input(s): AMMONIA in the last 168 hours. Coagulation Profile: Recent Labs  Lab 03/21/17 1936 03/22/17 0250  INR 1.07 1.22   Cardiac Enzymes: No results for input(s): CKTOTAL, CKMB, CKMBINDEX, TROPONINI in the last 168 hours. BNP (last 3 results) No results for input(s): PROBNP in the last 8760 hours. HbA1C: No results for input(s): HGBA1C in the last 72 hours. CBG: No results for input(s): GLUCAP in the last 168 hours. Lipid Profile: No results for input(s): CHOL, HDL, LDLCALC, TRIG, CHOLHDL, LDLDIRECT in the last 72 hours. Thyroid Function Tests: No results for input(s): TSH, T4TOTAL, FREET4, T3FREE, THYROIDAB in the last 72 hours. Anemia Panel: No results for input(s): VITAMINB12, FOLATE, FERRITIN, TIBC, IRON, RETICCTPCT in the last 72 hours. Sepsis Labs: Recent Labs  Lab 03/21/17 1954 03/21/17 2135 03/22/17 0250 03/22/17 0540  PROCALCITON  --   --  10.89  --   LATICACIDVEN 2.12* 1.95*  --  1.4    Recent Results (from the past 240 hour(s))  Culture, blood (Routine x 2)     Status: None (Preliminary result)   Collection Time: 03/21/17  7:34 PM  Result Value Ref Range Status   Specimen Description   Final    BLOOD RIGHT FOREARM Performed at [facility] [address] [place], [place] [address]    Special Requests   Final    BOTTLES DRAWN AEROBIC AND ANAEROBIC Blood Culture adequate volume Performed at [facility] [address], [place], [place] [address]    Culture   Final    NO GROWTH < 24 HOURS Performed at [facility] [address], [place], [place] [address]    Report Status PENDING  Incomplete  Culture, blood (Routine x 2)     Status: None (Preliminary result)   Collection  Time: 03/21/17  7:41 PM  Result Value Ref Range Status   Specimen Description   Final    BLOOD RIGHT ANTECUBITAL Performed at [facility] [address], [place], [place] [address]    Special Requests   Final    BOTTLES DRAWN AEROBIC AND ANAEROBIC Blood Culture adequate volume Performed at [facility] [address], [place], [place] [address]    Culture   Final    NO GROWTH < 24 HOURS Performed at [facility] [address] [place],  [address]    Report Status PENDING  Incomplete         Radiology Studies: Dg Chest 2 View  Result Date: 03/21/2017 CLINICAL DATA:  Dehydration.  Possible sepsis. EXAM: CHEST  2 VIEW COMPARISON:  Two-view chest x-ray 12/16/2015 FINDINGS: Heart size is normal. Lungs are clear. Thoracolumbar curvature is stable. Surgical clips are present at the gallbladder fossa. IMPRESSION: No acute  cardiopulmonary disease or significant interval change. Electronically Signed   By: San Morelle M.D.   On: 03/21/2017 20:32   Ct Renal Stone Study  Result Date: 03/22/2017 CLINICAL DATA:  Urinary tract infection, uncomplicated. Recent diagnosis of urinary tract infection, noncompliant with medical therapy. EXAM: CT ABDOMEN AND PELVIS WITHOUT CONTRAST TECHNIQUE: Multidetector CT imaging of the abdomen and pelvis was performed following the standard protocol without IV contrast. COMPARISON:  CT 12/16/2015 FINDINGS: Lower chest: Basilar septal thickening suggest pulmonary edema. There mitral annulus calcifications. No significant pleural fluid. Compressive atelectasis in the medial right lower lobe adjacent to large osteophytes. Hepatobiliary: Chronic pneumobilia in the left hepatic lobe and common bile duct. Wedge-shaped hypodensities in the dome of the liver on prior contrast-enhanced exam are not seen. No evidence of new focal lesion. Clips in the gallbladder fossa postcholecystectomy. Pancreas: No ductal dilatation  or inflammation. Spleen: Normal in size without focal abnormality. Adrenals/Urinary Tract: No adrenal nodule. Prominence of the bilateral renal pelvis secondary to parapelvic cysts as seen on prior contrast-enhanced CT. No hydronephrosis. No urolithiasis. Minimal symmetric perinephric edema. No evidence of perirenal fluid collection. Urinary bladder is physiologically distended. No definite bladder wall thickening, however streak artifact from right hip arthroplasty partially obscures evaluation. No urinary tract air. Stomach/Bowel: Colonic diverticulosis, extensive in the sigmoid colon, without diverticulitis. Normal appendix. No bowel inflammation, obstruction or evidence of wall thickening. Small hiatal hernia. Stomach physiologically distended. Vascular/Lymphatic: Advanced aortic atherosclerosis and tortuosity. Mild bi-iliac atherosclerosis. No bulky adenopathy in the abdomen or pelvis, streak artifact from right hip arthroplasty partially obscures right pelvis evaluation. 14 mm splenic artery aneurysm at the hilum versus arterial tortuosity, unchanged from prior exams. Reproductive: Enlarged prostate gland measuring 5.8 x 5.3 cm. Other: No ascites or free air. Minimal fat in the left inguinal canal. No evidence of intra-abdominal abscess. Musculoskeletal: Advanced degenerative change throughout spine with scoliosis. Right hip arthroplasty. There are no acute or suspicious osseous abnormalities. IMPRESSION: 1. Mild bilateral perinephric edema which may be chronic or seen with urinary tract infection. No urolithiasis. Bilateral renal pelvis prominence secondary to parapelvic cysts as seen on prior contrast-enhanced exams. 2. Colonic diverticulosis without diverticulitis. Aortic Atherosclerosis (ICD10-I70.0). 3. Lung base findings of septal thickening suggests mild pulmonary edema. Electronically Signed   By: Jeb Levering M.D.   On: 03/22/2017 00:32        Scheduled Meds: . feeding supplement (ENSURE  ENLIVE)  237 mL Oral BID BM  . finasteride  5 mg Oral Daily  . metoprolol succinate  25 mg Oral Daily  . tamsulosin  0.4 mg Oral QPC supper   Continuous Infusions: . cefTRIAXone (ROCEPHIN)  IV       LOS: 0 days    Time spent: [place], MD [facility]  If 7PM-7AM, please contact night-coverage [email or web address] Password [email or web address] 03/22/2017, 2:05 PM

## 2017-03-22 NOTE — Progress Notes (Signed)
Initial Nutrition Assessment  INTERVENTION:   -Continue Ensure Enlive po BID, each supplement provides 350 kcal and 20 grams of protein -Encouraged small, frequent meals  NUTRITION DIAGNOSIS:   Inadequate oral intake related to poor appetite as evidenced by per patient/family report.  GOAL:   Patient will meet greater than or equal to 90% of their needs  MONITOR:   PO intake, Supplement acceptance, Labs, Weight trends, I & O's  REASON FOR ASSESSMENT:   Malnutrition Screening Tool    ASSESSMENT:   84 year old with past medical history relevant for coronary artery disease, sleep apnea, hypertension, hyperlipidemia, recurrent UTIs and nephrolithiasis who was admitted with sepsis from urinary source.  Patient in room with daughter at bedside. Pt reports not having a big appetite, states on a good day he will eat 2 meals a day. Pt states he doesn't feel hungry so he will sit hours with his dog in his chair at a time. Per pt's daughter, pt's wife (who passed away) cooked and prepared most meals for pt and now he is unwilling to cook for himself. Pt also reports he does not drink enough fluids. Encouraged pt to carry around a water bottle or some other beverage with him at all times and consistently sip throughout the day. Pt states it is difficult to have to go to the bathroom often.   Pt drinks Ensure supplements at home and sometimes replaces meals with these supplements. Encouraged pt to use Ensure as a supplement and not a replacement for a meal. Encouraged small, frequent meals with some protein. Pt eats meat, beans and likes dairy foods.   Per chart review, pt's weight has remained stable.   Labs reviewed. Medications reviewed.  NUTRITION - FOCUSED PHYSICAL EXAM:    Most Recent Value  Orbital Region  No depletion  Upper Arm Region  Mild depletion  Thoracic and Lumbar Region  Unable to assess  Buccal Region  No depletion  Temple Region  Moderate depletion  Clavicle Bone  Region  Moderate depletion  Clavicle and Acromion Bone Region  Moderate depletion  Scapular Bone Region  Unable to assess  Dorsal Hand  No depletion [arthritic]  Patellar Region  Unable to assess  Anterior Thigh Region  Unable to assess  Posterior Calf Region  Unable to assess  Edema (RD Assessment)  None       Diet Order:  Diet Heart Room service appropriate? Yes; Fluid consistency: Thin  EDUCATION NEEDS:   Education needs have been addressed  Skin:  Skin Assessment: Reviewed RN Assessment  Last BM:  3/4  Height:   Ht Readings from Last 1 Encounters:  03/22/17 5' (1.524 m)    Weight:   Wt Readings from Last 1 Encounters:  03/22/17 136 lb 11.2 oz (62 kg)    Ideal Body Weight:  48.1 kg  BMI:  Body mass index is 26.7 kg/m.  Estimated Nutritional Needs:   Kcal:  1550-1750  Protein:  70-80g  Fluid:  1.7L/day  Clayton Bibles, MS, RD, LDN [place] Dietitian Pager: [phone] After Hours Pager: [phone]

## 2017-03-22 NOTE — Telephone Encounter (Signed)
Copied from [place]. Topic: Quick Communication - Appointment Cancellation >> Mar 22, 2017  8:02 AM Burnis Medin, NT wrote: Patient called to cancel appointment scheduled for today @ 8:15 with Alma Friendly    Route to department's [facility] pool.

## 2017-03-22 NOTE — Progress Notes (Signed)
Pharmacy Antibiotic Note  [name] is a 84 y.o. male admitted on 03/21/2017 with UTI.  Pharmacy has been consulted for rocephin dosing.  Plan: Rocephin 1 Gm IV q24h  Rx will sign off as no further adjustments needed  Height: 5' (152.4 cm) Weight: 134 lb (60.8 kg) IBW/kg (Calculated) : 50  Temp (24hrs), Avg:98.6 F (37 C), Min:97.6 F (36.4 C), Max:99.6 F (37.6 C)  Recent Labs  Lab 03/21/17 1936 03/21/17 1954 03/21/17 2135  WBC 24.4*  --   --   CREATININE 1.15  --   --   LATICACIDVEN  --  2.12* 1.95*    Estimated Creatinine Clearance: 38.7 mL/min (by C-G formula based on SCr of 1.15 mg/dL).    Allergies  Allergen Reactions  . Penicillins Hives and Other (See Comments)    Whelps, passed out Tolerates cephalosporins  Has patient had a PCN reaction causing immediate rash, facial/tongue/throat swelling, SOB or lightheadedness with hypotension:  yes Has patient had a PCN reaction causing severe rash involving mucus membranes or skin necrosis: no Has patient had a PCN reaction that required hospitalization: no Has patient had a PCN reaction occurring within the last 10 years: no If all of the above answers are "NO", then may proceed with Cephalosporin use.     Antimicrobials this admission: 3/4 rocephin >>    >>   Dose adjustments this admission:   Microbiology results:  BCx:   UCx:    Sputum:    MRSA PCR:   Thank you for allowing pharmacy to be a part of this patient's care.  [name] 03/22/2017 1:52 AM

## 2017-03-22 NOTE — Telephone Encounter (Signed)
No fee, patient currently admitted to [facility]. Please reschedule for a 45 minute new patient/hospital follow up post discharge.

## 2017-03-22 NOTE — H&P (Signed)
History and Physical    [name] WCB:[id number] DOB: [birth year] DOA: 03/21/2017  PCP: Lucille Passy, MD  Patient coming from: Home.  Chief Complaint: Fever chills and dysuria.  HPI: [name] is a 84 y.o. male with history of CAD, sleep apnea, hypertension, hyperlipidemia and previous history of UTI with ureteral stone had been removed about a year ago presents to the ER because of fever chills nausea vomiting and dysuria.  Patient has been having the symptoms for last 48 hours.  He had gone to his PCP and was prescribed doxycycline.  After taking which patient had nausea vomiting.  Denies any abdominal pain.  Denies any diarrhea chest pain shortness of breath or productive cough.  ED Course: In the ER patient is found to be hypotensive which improved with fluids.  Lab work showed leukocytosis of 24,000 with severe thrombocytopenia.  No renal failure.  Lactate was elevated.  Blood pressure improved with fluids.  CT scan renal study does not show any obstruction but does show features concerning for UTI.  Patient is being admitted for possible developing sepsis from UTI.  Patient needed to give a sample of urine.  Review of Systems: As per HPI, rest all negative.   Past Medical History:  Diagnosis Date  . Arthralgia of multiple joints    limited mobility w/  independant adl's  . BPH with obstruction/lower urinary tract symptoms   . Chronic idiopathic monocytosis    followed by dr Waymon Budge--  per lov note 06/ 2017 persistant unexplained  with normal bone barrow bx  . Coronary atherosclerosis of unspecified type of vessel, native or graft    cardiologist-  dr Stanford Breed -- per lov note 11-19-2014 ,  cardiac cath in 2000-- pLAD 60-70%,  small intermediate branch 70-80%,  mRCA 20%,  normal LV  . Degenerative arthritis of spine    cervical and lumar  . Diverticulosis of colon (without mention of hemorrhage)   . Dysuria    chronic  . Elevated PSA   . Feeling of incomplete  bladder emptying   . Hiatal hernia    moderate per ct 11/ 2017  . History of adenomatous polyp of colon    2008  . History of atrial fibrillation    remote hx episode  . History of pancreatitis    07-01-2013  and 10-10-2015  . History of prostatitis    2014  . Hyperlipidemia   . Hypertension   . Mouth sore    roof of mouth sore   . Nephrolithiasis   . Nocturia more than twice per night    severe  w/ leakage  . OSA (obstructive sleep apnea)    INTOLERANT CPAP  . Osteoarthritis   . Pernicious anemia    B12  Def.  . Peyronie disease   . Renal insufficiency   . Thrombocytopenia, unspecified ([place]) hemotology/oncologist-  dr Waymon Budge   per dr Velta Addison note 06/ 2016  secondary to vitro clumping    Past Surgical History:  Procedure Laterality Date  . BONE MARROW BIOPSY  2011   normal  . CARDIAC CATHETERIZATION  2000   per dr Kathyrn Drown note --  dLAD 60-70%,  small intermediate branch 70-80%,  mRCA 20%,  normal LV  . CARDIOVASCULAR STRESS TEST  01-29-2013   dr Stanford Breed   normal nuclear study w/ no ischemia,  normal LV function and wall motion , ef 82%  . CATARACT EXTRACTION W/ INTRAOCULAR LENS  IMPLANT, BILATERAL  08/2015  .  CHOLECYSTECTOMY  11/24/2010   Procedure: LAPAROSCOPIC CHOLECYSTECTOMY WITH INTRAOPERATIVE CHOLANGIOGRAM;  Surgeon: Earnstine Regal, MD;  Location: [facility];  Service: General;  Laterality: N/A;  c-arm  . CYSTOSCOPY W/ URETERAL STENT PLACEMENT Left 12/17/2015   Procedure: CYSTOSCOPY WITH RETROGRADE PYELOGRAM/URETERAL STENT PLACEMENT;  Surgeon: Ardis Hughs, MD;  Location: [facility];  Service: Urology;  Laterality: Left;  . CYSTOSCOPY WITH RETROGRADE PYELOGRAM, URETEROSCOPY AND STENT PLACEMENT Left 12/22/2015   Procedure: CYSTOSCOPY WITH LEFT RETROGRADE  URETEROSCOPY AND STENT PLACEMENT;  Surgeon: Carolan Clines, MD;  Location: [facility];  Service: Urology;  Laterality: Left;  . ERCP N/A 07/04/2013   Procedure: ENDOSCOPIC  RETROGRADE CHOLANGIOPANCREATOGRAPHY (ERCP);  Surgeon: Gatha Mayer, MD;  Location: [facility];  Service: Endoscopy;  Laterality: N/A;  MAC if available  . ERCP N/A 10/12/2015   Procedure: ENDOSCOPIC RETROGRADE CHOLANGIOPANCREATOGRAPHY (ERCP);  Surgeon: [name], MD;  Location: [facility];  Service: Endoscopy;  Laterality: N/A;  . HOLMIUM LASER APPLICATION Left 62/0/3559   Procedure: HOLMIUM LASER APPLICATION;  Surgeon: Carolan Clines, MD;  Location: [facility];  Service: Urology;  Laterality: Left;  . INGUINAL HERNIA REPAIR Right 01/25/2002  . KNEE ARTHROSCOPY  x5  . SATURATION BIOPSY OF PROSTATE  05-29-2007  and 01-26-2008  . SHOULDER ARTHROSCOPY WITH OPEN ROTATOR CUFF REPAIR AND DISTAL CLAVICLE ACROMINECTOMY Right 11/19/2004  . TOTAL HIP ARTHROPLASTY Right 05/21/2009  . TOTAL KNEE ARTHROPLASTY Bilateral left 05-02-2003/  right  03-23-2010  . TRANSTHORACIC ECHOCARDIOGRAM  12/23/2004   ef 60%, mild MV calcification without stenosis/  mild TR,  PASP 58mHg  . UVULOPALATOPHARYNGOPLASTY  1993    w/  T & A     reports that he quit smoking about 51 years ago. His smoking use included pipe. he has never used smokeless tobacco. He reports that he does not drink alcohol or use drugs.  Allergies  Allergen Reactions  . Penicillins Hives and Other (See Comments)    Whelps, passed out Tolerates cephalosporins  Has patient had a PCN reaction causing immediate rash, facial/tongue/throat swelling, SOB or lightheadedness with hypotension:  yes Has patient had a PCN reaction causing severe rash involving mucus membranes or skin necrosis: no Has patient had a PCN reaction that required hospitalization: no Has patient had a PCN reaction occurring within the last 10 years: no If all of the above answers are "NO", then may proceed with Cephalosporin use.     Family History  Problem Relation Age of Onset  . Colon cancer Father 460 . Peripheral vascular disease Mother      Prior to Admission medications   Medication Sig Start Date End Date Taking? Authorizing Provider  albuterol (PROVENTIL HFA;VENTOLIN HFA) 108 (90 Base) MCG/ACT inhaler Inhale 2 puffs into the lungs every 6 (six) hours as needed. 12/30/15  Yes ALucille Passy MD  Artificial Tear Solution (TEARS NATURALE OP) Place 1-2 drops into both eyes at bedtime as needed (dry eyes).   Yes [provider]  aspirin EC 81 MG tablet Take 1 tablet (81 mg total) by mouth daily. Patient taking differently: Take 81 mg by mouth every 4 (four) hours as needed for mild pain.  12/26/15  Yes TCarolan Clines MD  metoprolol succinate (TOPROL-XL) 25 MG 24 hr tablet Take 1 tablet (25 mg total) by mouth daily. 10/07/16  Yes MAlmyra Deforest PA  Multiple Vitamin (MULTIVITAMIN) tablet Take 1 tablet by mouth daily.   Yes [provider]  nitrofurantoin, macrocrystal-monohydrate, (MACROBID) 100 MG capsule  Take 1 capsule by mouth 2 (two) times daily. 03/14/17  Yes [provider]  ciprofloxacin (CIPRO) 500 MG tablet Take 1 tablet (500 mg total) by mouth 2 (two) times daily. Patient not taking: Reported on 03/21/2017 10/08/16   Tonia Ghent, MD    Physical Exam: Vitals:   03/21/17 2127 03/21/17 2201 03/21/17 2304 03/22/17 0102  BP:  (!) 100/54 (!) 98/43 (!) 103/55  Pulse:  91 93 91  Resp:  _0 Temp:    97.6 F (36.4 C)  TempSrc:    Oral  SpO2:  93% 97% 93%  Weight: 60.8 kg (134 lb)     Height: 5' (1.524 m)         Constitutional: Moderately built and nourished. Vitals:   03/21/17 2127 03/21/17 2201 03/21/17 2304 03/22/17 0102  BP:  (!) 100/54 (!) 98/43 (!) 103/55  Pulse:  91 93 91  Resp:  _1 Temp:    97.6 F (36.4 C)  TempSrc:    Oral  SpO2:  93% 97% 93%  Weight: 60.8 kg (134 lb)     Height: 5' (1.524 m)      Eyes: Anicteric no pallor. ENMT: No discharge from the ears eyes nose or mouth. Neck: No mass felt.  No neck rigidity. Respiratory: No rhonchi or  crepitations. Cardiovascular: S1-S2 heard no murmurs appreciated. Abdomen: Soft nontender bowel sounds present. Musculoskeletal: No edema.  No joint effusion. Skin: No rash. Neurologic: Alert awake oriented to time place and person.  Moves all extremities. Psychiatric: Appears normal.  Normal affect.   Labs on Admission: I have personally reviewed following labs and imaging studies  CBC: Recent Labs  Lab 03/21/17 1936  WBC 24.4*  NEUTROABS 19.8*  HGB 17.2*  HCT 47.1  MCV 87.5  PLT 57*   Basic Metabolic Panel: Recent Labs  Lab 03/21/17 1936  NA 140  K 3.7  CL 106  CO2 23  GLUCOSE 144*  BUN 13  CREATININE 1.15  CALCIUM 9.1   GFR: Estimated Creatinine Clearance: 38.7 mL/min (by C-G formula based on SCr of 1.15 mg/dL). Liver Function Tests: Recent Labs  Lab 03/21/17 1936  AST 19  ALT 15*  ALKPHOS 65  BILITOT 1.8*  PROT 6.0*  ALBUMIN 4.1   No results for input(s): LIPASE, AMYLASE in the last 168 hours. No results for input(s): AMMONIA in the last 168 hours. Coagulation Profile: Recent Labs  Lab 03/21/17 1936  INR 1.07   Cardiac Enzymes: No results for input(s): CKTOTAL, CKMB, CKMBINDEX, TROPONINI in the last 168 hours. BNP (last 3 results) No results for input(s): PROBNP in the last 8760 hours. HbA1C: No results for input(s): HGBA1C in the last 72 hours. CBG: No results for input(s): GLUCAP in the last 168 hours. Lipid Profile: No results for input(s): CHOL, HDL, LDLCALC, TRIG, CHOLHDL, LDLDIRECT in the last 72 hours. Thyroid Function Tests: No results for input(s): TSH, T4TOTAL, FREET4, T3FREE, THYROIDAB in the last 72 hours. Anemia Panel: No results for input(s): VITAMINB12, FOLATE, FERRITIN, TIBC, IRON, RETICCTPCT in the last 72 hours. Urine analysis:    Component Value Date/Time   COLORURINE YELLOW 12/16/2015 1445   APPEARANCEUR CLOUDY (A) 12/16/2015 1445   LABSPEC 1.018 12/16/2015 1445   PHURINE 6.0 12/16/2015 1445   GLUCOSEU NEGATIVE  12/16/2015 1445   HGBUR SMALL (A) 12/16/2015 1445   HGBUR negative 06/02/2009 1059   BILIRUBINUR 1+ 10/08/2016 1013   KETONESUR NEGATIVE 12/16/2015 1445   PROTEINUR 1+ 10/08/2016 1013  PROTEINUR NEGATIVE 12/16/2015 1445   UROBILINOGEN 0.2 10/08/2016 1013   UROBILINOGEN 1.0 07/01/2013 0450   NITRITE Neg 10/08/2016 1013   NITRITE POSITIVE (A) 12/16/2015 1445   LEUKOCYTESUR Moderate (2+) (A) 10/08/2016 1013   Sepsis Labs: _0 (procalcitonin:4,lacticidven:4) )No results found for this or any previous visit (from the past 240 hour(s)).   Radiological Exams on Admission: Dg Chest 2 View  Result Date: 03/21/2017 CLINICAL DATA:  Dehydration.  Possible sepsis. EXAM: CHEST  2 VIEW COMPARISON:  Two-view chest x-ray 12/16/2015 FINDINGS: Heart size is normal. Lungs are clear. Thoracolumbar curvature is stable. Surgical clips are present at the gallbladder fossa. IMPRESSION: No acute cardiopulmonary disease or significant interval change. Electronically Signed   By: San Morelle M.D.   On: 03/21/2017 20:32   Ct Renal Stone Study  Result Date: 03/22/2017 CLINICAL DATA:  Urinary tract infection, uncomplicated. Recent diagnosis of urinary tract infection, noncompliant with medical therapy. EXAM: CT ABDOMEN AND PELVIS WITHOUT CONTRAST TECHNIQUE: Multidetector CT imaging of the abdomen and pelvis was performed following the standard protocol without IV contrast. COMPARISON:  CT 12/16/2015 FINDINGS: Lower chest: Basilar septal thickening suggest pulmonary edema. There mitral annulus calcifications. No significant pleural fluid. Compressive atelectasis in the medial right lower lobe adjacent to large osteophytes. Hepatobiliary: Chronic pneumobilia in the left hepatic lobe and common bile duct. Wedge-shaped hypodensities in the dome of the liver on prior contrast-enhanced exam are not seen. No evidence of new focal lesion. Clips in the gallbladder fossa postcholecystectomy. Pancreas: No ductal  dilatation or inflammation. Spleen: Normal in size without focal abnormality. Adrenals/Urinary Tract: No adrenal nodule. Prominence of the bilateral renal pelvis secondary to parapelvic cysts as seen on prior contrast-enhanced CT. No hydronephrosis. No urolithiasis. Minimal symmetric perinephric edema. No evidence of perirenal fluid collection. Urinary bladder is physiologically distended. No definite bladder wall thickening, however streak artifact from right hip arthroplasty partially obscures evaluation. No urinary tract air. Stomach/Bowel: Colonic diverticulosis, extensive in the sigmoid colon, without diverticulitis. Normal appendix. No bowel inflammation, obstruction or evidence of wall thickening. Small hiatal hernia. Stomach physiologically distended. Vascular/Lymphatic: Advanced aortic atherosclerosis and tortuosity. Mild bi-iliac atherosclerosis. No bulky adenopathy in the abdomen or pelvis, streak artifact from right hip arthroplasty partially obscures right pelvis evaluation. 14 mm splenic artery aneurysm at the hilum versus arterial tortuosity, unchanged from prior exams. Reproductive: Enlarged prostate gland measuring 5.8 x 5.3 cm. Other: No ascites or free air. Minimal fat in the left inguinal canal. No evidence of intra-abdominal abscess. Musculoskeletal: Advanced degenerative change throughout spine with scoliosis. Right hip arthroplasty. There are no acute or suspicious osseous abnormalities. IMPRESSION: 1. Mild bilateral perinephric edema which may be chronic or seen with urinary tract infection. No urolithiasis. Bilateral renal pelvis prominence secondary to parapelvic cysts as seen on prior contrast-enhanced exams. 2. Colonic diverticulosis without diverticulitis. Aortic Atherosclerosis (ICD10-I70.0). 3. Lung base findings of septal thickening suggests mild pulmonary edema. Electronically Signed   By: Jeb Levering M.D.   On: 03/22/2017 00:32     Assessment/Plan Principal Problem:    SIRS (systemic inflammatory response syndrome) (HCC) Active Problems:   HLD (hyperlipidemia)   Essential hypertension   Sleep apnea   Sepsis ([place])    1. SIRS with possible developing sepsis source likely urine given that patient has dysuria.  Patient does not have any chest pain or productive cough to suggest upper respiratory tract source.  Patient is here to give a sample of urine.  Patient is being started on ceftriaxone for possible developing sepsis from UTI.  CT scan does show features concerning for UTI/pyelonephritis but no obstruction.  Continue with hydration follow lactate pro calcitonin levels. 2. Severe thrombocytopenia -appears to be new and likely infectious reason.  I have ordered a DIC panel and LDH.  Closely follow CBC. 3. History of CAD -denies any chest pain.  Patient takes aspirin as needed.  Presently severe thrombocytopenia would hold.  Does not take statins.  Holding beta blockers due to hypotension. 4. History of sleep apnea intolerant to CPAP. 5. Hypertension holding antihypertensives due to hypotension. 6. Previous history of UTI and renal stone obstruction has had stents placed previously.   DVT prophylaxis: SCDs due to severe thrombocytopenia. Code Status: DNR. Family Communication: Discussed with patient. Disposition Plan: Home. Consults called: None. Admission status: Observation.   Rise Patience MD [facility] Pager [phone].  If 7PM-7AM, please contact night-coverage [email or web address] Password [email or web address]  03/22/2017, 1:42 AM

## 2017-03-22 NOTE — Telephone Encounter (Signed)
Should patient be charged a late cancellation fee? Does appointment need to be rescheduled?

## 2017-03-23 LAB — BASIC METABOLIC PANEL
BUN: 13 mg/dL (ref 6–20)
CO2: 24 mmol/L (ref 22–32)
Calcium: 8.2 mg/dL — ABNORMAL LOW (ref 8.9–10.3)
Chloride: 115 mmol/L — ABNORMAL HIGH (ref 101–111)
Creatinine, Ser: 0.9 mg/dL (ref 0.61–1.24)
GFR calc Af Amer: >60 mL/min (ref >60)

## 2017-03-23 LAB — CBC
HCT: 37.5 % — ABNORMAL LOW (ref 39.0–52.0)
Hemoglobin: 12.3 g/dL — ABNORMAL LOW (ref 13.0–17.0)
MCH: 29.1 pg (ref 26.0–34.0)
MCHC: 32.8 g/dL (ref 30.0–36.0)
MCV: 88.7 fL (ref 78.0–100.0)
Platelets: 59 K/uL — ABNORMAL LOW (ref 150–400)
RBC: 4.23 MIL/uL (ref 4.22–5.81)
RDW: 14.2 % (ref 11.5–15.5)
WBC: 7.4 K/uL (ref 4.0–10.5)

## 2017-03-23 LAB — BASIC METABOLIC PANEL WITH GFR
Anion gap: 5 (ref 5–15)
GFR calc non Af Amer: >60 mL/min (ref >60)
Glucose, Bld: 104 mg/dL — ABNORMAL HIGH (ref 65–99)
Potassium: 3.9 mmol/L (ref 3.5–5.1)
Sodium: 144 mmol/L (ref 135–145)

## 2017-03-23 LAB — URINE CULTURE: Culture: NO GROWTH

## 2017-03-23 LAB — MAGNESIUM: Magnesium: 2 mg/dL (ref 1.7–2.4)

## 2017-03-23 NOTE — Progress Notes (Signed)
PROGRESS NOTE    [name]  JHE:[id number] DOB: Oct 06, 1933 DOA: 03/21/2017 PCP: Lucille Passy, MD    Subjective: Patient was seen and examined this morning, stable no acute distress.   No issues overnight Afebrile normotensive, you marked improvement of leukocytosis Blood and urine cultures negative to date. --------------------------------------------------------------------------------------------------------------------------  Brief Narrative:  84 year old with past medical history relevant for coronary artery disease, sleep apnea, hypertension, hyperlipidemia, recurrent UTIs and nephrolithiasis who was admitted with sepsis from urinary source.   Assessment & Plan:   Principal Problem:   SIRS (systemic inflammatory response syndrome) (HCC) Active Problems:   HLD (hyperlipidemia)   Essential hypertension   Sleep apnea   Sepsis ([place])   Sepsis from urinary source: CT shows no evidence of nephrolithiasis.   Improved dramatically with IV fluids and IV antibiotics -Following up with urine cultures, obtained on 03/22/2018 19- to date -Improved symptomatically, continue Rocephin anticipating DC vancomycin -Biotics initiated 03/21/2017 -Improved lactic acid from 2.1 to...> 1.4 -Improved WBC from 24.4.....> 7.4    Benign prostatic hypertrophy:  Patient reports significant lower urinary tract symptoms which causes him to withhold urine particularly at night when he has difficulty getting up out of bed. -Start tamsulosin 0.4 mg -Start finasteride 5 mg   Coronary artery disease: -Continue aspirin 81 mg -Continue metoprolol succinate 25 mg daily  Hypertension: -Continue beta-blocker   Thrombocytopenia -Chronic, continue monitor, no evidence of DIC    Fluids: Discontinue IV fluids Elect lites: Monitor and support Nutrition: Heart healthy diet  Prophylaxis: Thrombocytopenic, SCDs   Disposition: Spitting to discharge home / pending organism isolation and oral  antibiotics   Ethics - DO NOT RESUSCITATE    Consultants:   None  Procedures:    Antimicrobials:  Ceftriaxone and vancomycin started 03/21/2017   Objective: Vitals:   03/22/17 0626 03/22/17 1500 03/22/17 2217 03/23/17 0434  BP: (!) 102/48 (!) 100/58 101/68   Pulse: 81 75 79 79  Resp: 18 18 16 20   Temp: 98.6 F (37 C) 98.4 F (36.9 C) 98.3 F (36.8 C) 97.6 F (36.4 C)  TempSrc: Oral Oral Oral Oral  SpO2: 96% 98% 95% 97%  Weight: 62 kg (136 lb 11.2 oz)   62.5 kg (137 lb 12.6 oz)  Height: 5' (1.524 m)       Intake/Output Summary (Last 24 hours) at 03/23/2017 1308 Last data filed at 03/23/2017 1031 Gross per 24 hour  Intake 720 ml  Output 500 ml  Net 220 ml   Filed Weights   03/21/17 2127 03/22/17 0626 03/23/17 0434  Weight: 60.8 kg (134 lb) 62 kg (136 lb 11.2 oz) 62.5 kg (137 lb 12.6 oz)    Examination:  BP 101/68 (BP Location: Right Arm)   Pulse 79   Temp 97.6 F (36.4 C) (Oral)   Resp 20   Ht 5' (1.524 m)   Wt 62.5 kg (137 lb 12.6 oz)   SpO2 97%   BMI 26.91 kg/m    Physical Exam  Constitution:  Alert, cooperative, no distress,  Psychiatric: Normal and stable mood and affect, cognition intact,   HEENT: Normocephalic, PERRL, otherwise with in Normal limits  Cardio vascular:  S1/S2, RRR, No murmure, No Rubs or Gallops  Chest/pulmonary: Clear to auscultation bilaterally, respirations unlabored  Chest symmetric Abdomen: Soft, non-tender, non-distended, bowel sounds,no masses, no organomegaly Muscular skeletal:  Generalized weakness is noted, Limited exam - in bed, able to move all 4 extremities, Normal strength,  Neuro: CNII-XII intact. , normal motor and sensation, reflexes  intact  Extremities: No pitting edema lower extremities, +2 pulses  Skin: Dry, warm to touch, negative for any Rashes, No open wounds      Data Reviewed: I have personally reviewed following labs and imaging studies  CBC: Recent Labs  Lab 03/21/17 1936 03/22/17 0250  03/22/17 0540 03/23/17 0447  WBC 24.4*  --  16.9* 7.4  NEUTROABS 19.8*  --  12.8*  --   HGB 17.2*  --  12.0* 12.3*  HCT 47.1  --  36.3* 37.5*  MCV 87.5  --  87.9 88.7  PLT 57* 48* 50* 59*   Basic Metabolic Panel: Recent Labs  Lab 03/21/17 1936 03/22/17 0540 03/23/17 0447  NA 140 141 144  K 3.7 3.7 3.9  CL 106 112* 115*  CO2 23 23 24   GLUCOSE 144* 127* 104*  BUN 13 16 13   CREATININE 1.15 1.10 0.90  CALCIUM 9.1 7.5* 8.2*  MG  --   --  2.0   GFR: Estimated Creatinine Clearance: 50.1 mL/min (by C-G formula based on SCr of 0.9 mg/dL). Liver Function Tests: Recent Labs  Lab 03/21/17 1936 03/22/17 0540  AST 19 14*  ALT 15* 11*  ALKPHOS 65 44  BILITOT 1.8* 0.9  PROT 6.0* 4.5*  ALBUMIN 4.1 2.7*   No results for input(s): LIPASE, AMYLASE in the last 168 hours. No results for input(s): AMMONIA in the last 168 hours. Coagulation Profile: Recent Labs  Lab 03/21/17 1936 03/22/17 0250  INR 1.07 1.22   Sepsis Labs: Recent Labs  Lab 03/21/17 1954 03/21/17 2135 03/22/17 0250 03/22/17 0540  PROCALCITON  --   --  10.89  --   LATICACIDVEN 2.12* 1.95*  --  1.4    Recent Results (from the past 240 hour(s))  Culture, blood (Routine x 2)     Status: None (Preliminary result)   Collection Time: 03/21/17  7:34 PM  Result Value Ref Range Status   Specimen Description   Final    BLOOD RIGHT FOREARM Performed at [facility] [address], [place], [place] [address]    Special Requests   Final    BOTTLES DRAWN AEROBIC AND ANAEROBIC Blood Culture adequate volume Performed at [facility] [address], [place], [place] [address]    Culture   Final    NO GROWTH < 24 HOURS Performed at [facility] [address], [place], [place] [address]    Report Status PENDING  Incomplete  Culture, blood (Routine x 2)     Status: None (Preliminary result)   Collection Time: 03/21/17  7:41 PM  Result Value Ref Range Status   Specimen  Description   Final    BLOOD RIGHT ANTECUBITAL Performed at [facility] [address] [place], [place] [address]    Special Requests   Final    BOTTLES DRAWN AEROBIC AND ANAEROBIC Blood Culture adequate volume Performed at [facility] [address], [place], [place] [address]    Culture   Final    NO GROWTH < 24 HOURS Performed at [facility] [address] [place], [place] [address]    Report Status PENDING  Incomplete  Culture, Urine     Status: None   Collection Time: 03/22/17  2:57 AM  Result Value Ref Range Status   Specimen Description   Final    URINE, CLEAN CATCH Performed at [facility], [place] [address] [place], [place] [address]  Special Requests   Final    NONE Performed at [facility], [place] [address], [place], [place] [address]    Culture   Final    NO GROWTH Performed at [facility], [place] [address], [place], [place] [address]    Report Status 03/23/2017 FINAL  Final         Radiology Studies: Dg Chest 2 View  Result Date: 03/21/2017 CLINICAL DATA:  Dehydration.  Possible sepsis. EXAM: CHEST  2 VIEW COMPARISON:  Two-view chest x-ray 12/16/2015 FINDINGS: Heart size is normal. Lungs are clear. Thoracolumbar curvature is stable. Surgical clips are present at the gallbladder fossa. IMPRESSION: No acute cardiopulmonary disease or significant interval change. Electronically Signed   By: San Morelle M.D.   On: 03/21/2017 20:32   Ct Renal Stone Study  Result Date: 03/22/2017 CLINICAL DATA:  Urinary tract infection, uncomplicated. Recent diagnosis of urinary tract infection, noncompliant with medical therapy. EXAM: CT ABDOMEN AND PELVIS WITHOUT CONTRAST TECHNIQUE: Multidetector CT imaging of the abdomen and pelvis was performed following the standard protocol without IV contrast. COMPARISON:  CT 12/16/2015 FINDINGS: Lower chest: Basilar septal thickening suggest  pulmonary edema. There mitral annulus calcifications. No significant pleural fluid. Compressive atelectasis in the medial right lower lobe adjacent to large osteophytes. Hepatobiliary: Chronic pneumobilia in the left hepatic lobe and common bile duct. Wedge-shaped hypodensities in the dome of the liver on prior contrast-enhanced exam are not seen. No evidence of new focal lesion. Clips in the gallbladder fossa postcholecystectomy. Pancreas: No ductal dilatation or inflammation. Spleen: Normal in size without focal abnormality. Adrenals/Urinary Tract: No adrenal nodule. Prominence of the bilateral renal pelvis secondary to parapelvic cysts as seen on prior contrast-enhanced CT. No hydronephrosis. No urolithiasis. Minimal symmetric perinephric edema. No evidence of perirenal fluid collection. Urinary bladder is physiologically distended. No definite bladder wall thickening, however streak artifact from right hip arthroplasty partially obscures evaluation. No urinary tract air. Stomach/Bowel: Colonic diverticulosis, extensive in the sigmoid colon, without diverticulitis. Normal appendix. No bowel inflammation, obstruction or evidence of wall thickening. Small hiatal hernia. Stomach physiologically distended. Vascular/Lymphatic: Advanced aortic atherosclerosis and tortuosity. Mild bi-iliac atherosclerosis. No bulky adenopathy in the abdomen or pelvis, streak artifact from right hip arthroplasty partially obscures right pelvis evaluation. 14 mm splenic artery aneurysm at the hilum versus arterial tortuosity, unchanged from prior exams. Reproductive: Enlarged prostate gland measuring 5.8 x 5.3 cm. Other: No ascites or free air. Minimal fat in the left inguinal canal. No evidence of intra-abdominal abscess. Musculoskeletal: Advanced degenerative change throughout spine with scoliosis. Right hip arthroplasty. There are no acute or suspicious osseous abnormalities. IMPRESSION: 1. Mild bilateral perinephric edema which may be  chronic or seen with urinary tract infection. No urolithiasis. Bilateral renal pelvis prominence secondary to parapelvic cysts as seen on prior contrast-enhanced exams. 2. Colonic diverticulosis without diverticulitis. Aortic Atherosclerosis (ICD10-I70.0). 3. Lung base findings of septal thickening suggests mild pulmonary edema. Electronically Signed   By: Jeb Levering M.D.   On: 03/22/2017 00:32      Scheduled Meds: . feeding supplement (ENSURE ENLIVE)  237 mL Oral BID BM  . finasteride  5 mg Oral Daily  . metoprolol succinate  25 mg Oral Daily  . tamsulosin  0.4 mg Oral QPC supper   Continuous Infusions: . cefTRIAXone (ROCEPHIN)  IV Stopped (03/22/17 2225)     LOS: 1 day    Time spent: 30   Deatra James, MD [facility]  If 7PM-7AM, please contact night-coverage [email or web address] Password [email or web address]  03/23/2017, 1:08 PM

## 2017-03-24 LAB — CBC
HCT: 37.9 % — ABNORMAL LOW (ref 39.0–52.0)
Hemoglobin: 12.4 g/dL — ABNORMAL LOW (ref 13.0–17.0)
MCH: 28.6 pg (ref 26.0–34.0)
MCHC: 32.7 g/dL (ref 30.0–36.0)
MCV: 87.5 fL (ref 78.0–100.0)
PLATELETS: 59 10*3/uL — AB (ref 150–400)
RBC: 4.33 MIL/uL (ref 4.22–5.81)
RDW: 13.9 % (ref 11.5–15.5)
WBC: 5 10*3/uL (ref 4.0–10.5)

## 2017-03-24 LAB — BASIC METABOLIC PANEL
Anion gap: 8 (ref 5–15)
BUN: 11 mg/dL (ref 6–20)
CO2: 25 mmol/L (ref 22–32)
CREATININE: 0.74 mg/dL (ref 0.61–1.24)
Calcium: 8.4 mg/dL — ABNORMAL LOW (ref 8.9–10.3)
Chloride: 110 mmol/L (ref 101–111)
GFR calc Af Amer: >60 mL/min (ref >60)
Glucose, Bld: 96 mg/dL (ref 65–99)
POTASSIUM: 3.4 mmol/L — AB (ref 3.5–5.1)
SODIUM: 143 mmol/L (ref 135–145)

## 2017-03-24 LAB — PSA: PROSTATIC SPECIFIC ANTIGEN: 4.25 ng/mL — AB (ref 0.00–4.00)

## 2017-03-24 MED ORDER — CIPROFLOXACIN HCL 500 MG PO TABS
500.0000 mg | ORAL_TABLET | Freq: Two times a day (BID) | ORAL | Status: DC
  Administered 2017-03-24: 500 mg via ORAL
  Filled 2017-03-24: qty 1

## 2017-03-24 MED ORDER — FINASTERIDE 5 MG PO TABS: 5.0000 mg | ORAL_TABLET | Freq: Every day | ORAL | 1 refills | Status: AC

## 2017-03-24 MED ORDER — CIPROFLOXACIN HCL 500 MG PO TABS: 500.0000 mg | ORAL_TABLET | Freq: Two times a day (BID) | ORAL | 0 refills | Status: AC

## 2017-03-24 MED ORDER — TAMSULOSIN HCL 0.4 MG PO CAPS: 0.4000 mg | ORAL_CAPSULE | Freq: Every day | ORAL | 1 refills | Status: DC

## 2017-03-24 MED ORDER — BACID PO TABS: 2.0000 | ORAL_TABLET | Freq: Three times a day (TID) | ORAL | 0 refills | Status: AC

## 2017-03-24 MED FILL — FINASTERIDE 5 MG TABLET: 5 | 30 days supply | Qty: 30 | Fill #0

## 2017-03-24 MED FILL — TAMSULOSIN HCL 0.4 MG CAP: 0.4 | 30 days supply | Qty: 30 | Fill #0

## 2017-03-24 MED FILL — CIPROFLOXACIN HCL 500 MG TA: 500 | 10 days supply | Qty: 20 | Fill #0

## 2017-03-24 NOTE — Evaluation (Signed)
Physical Therapy Evaluation Patient Details Name: [name] MRN: [id number] DOB: Jul 18, 1933 Today's Date: 03/24/2017   History of Present Illness  84 year old with past medical history relevant for coronary artery disease, sleep apnea, hypertension, hyperlipidemia, recurrent UTIs and nephrolithiasis who was admitted with sepsis from urinary source.  Clinical Impression  Pt is currently Independent with mobility 439ft on the unit without an assistive device. Pt does not have an acute PT need and is ready for d/c home. Pt is d/c from PT services.    Follow Up Recommendations No PT follow up    Equipment Recommendations  None recommended by PT    Recommendations for Other Services       Precautions / Restrictions Precautions Precautions: None Restrictions Weight Bearing Restrictions: No      Mobility  Bed Mobility Overal bed mobility: Independent                Transfers Overall transfer level: Independent Equipment used: None                Ambulation/Gait Ambulation/Gait assistance: Independent Ambulation Distance (Feet): 400 Feet Assistive device: None Gait Pattern/deviations: WFL(Within Functional Limits)   Gait velocity interpretation: at or above normal speed for age/gender    Stairs            Wheelchair Mobility    Modified Rankin (Stroke Patients Only)       Balance Overall balance assessment: No apparent balance deficits (not formally assessed)                                           Pertinent Vitals/Pain Pain Assessment: No/denies pain    Home Living Family/patient expects to be discharged to:: Private residence Living Arrangements: Alone Available Help at Discharge: Family;Available PRN/intermittently Type of Home: House Home Access: Stairs to enter   Entrance Stairs-Number of Steps: 2 Home Layout: One level Home Equipment: Walker - 2 wheels      Prior Function Level of Independence:  Independent               Hand Dominance        Extremity/Trunk Assessment        Lower Extremity Assessment Lower Extremity Assessment: Overall WFL for tasks assessed       Communication   Communication: No difficulties  Cognition Arousal/Alertness: Awake/alert Behavior During Therapy: WFL for tasks assessed/performed Overall Cognitive Status: Within Functional Limits for tasks assessed                                        General Comments      Exercises     Assessment/Plan    PT Assessment Patent does not need any further PT services  PT Problem List         PT Treatment Interventions      PT Goals (Current goals can be found in the Care Plan section)       Frequency     Barriers to discharge        Co-evaluation               AM-PAC PT "6 Clicks" Daily Activity  Outcome Measure Difficulty turning over in bed (including adjusting bedclothes, sheets and blankets)?: None Difficulty moving from lying on back to sitting on the  side of the bed? : None Difficulty sitting down on and standing up from a chair with arms (e.g., wheelchair, bedside commode, etc,.)?: None Help needed moving to and from a bed to chair (including a wheelchair)?: None Help needed walking in hospital room?: None Help needed climbing 3-5 steps with a railing? : None 6 Click Score: 24    End of Session   Activity Tolerance: Patient tolerated treatment well Patient left: in bed;with call bell/phone within reach Nurse Communication: Mobility status(Pt is [place]) PT Visit Diagnosis: Difficulty in walking, not elsewhere classified (R26.2)    Time: 7829-5621 PT Time Calculation (min) (ACUTE ONLY): 16 min   Charges:   PT Evaluation $PT Eval Low Complexity: 1 Low     PT G Codes:        Lilyan Punt, PT  Halina Asano Kerstine 03/24/2017, 10:44 AM

## 2017-03-24 NOTE — Telephone Encounter (Signed)
Yes, okay to schedule for 03/14. Thanks!

## 2017-03-24 NOTE — Telephone Encounter (Signed)
Next available 45 minutes isn't until 3/14. Is this ok to wait until then?

## 2017-03-24 NOTE — Discharge Summary (Signed)
Physician Discharge Summary [facility]    Patient: [name]                   Admit date: 03/21/2017   DOB: [birth year]             Discharge date:03/24/2017/12:42 PM ELF:[id number]                           PCP: Lucille Passy, MD Recommendations for Outpatient Follow-up:   1.  Please follow-up with your primary care physician within 1-2 weeks. 2.  Please F/up with Urologist in 1 -2 weeks    Discharge Condition: Stable  CODE STATUS:  DNR  Diet recommendation: Regular healthy diet  ----------------------------------------------------------------------------------------------------------------------  Discharge Diagnoses:   Principal Problem:   SIRS (systemic inflammatory response syndrome) (HCC) Active Problems:   HLD (hyperlipidemia)   Essential hypertension   Sleep apnea   Sepsis ([place])   History of present illness :  [name] is a 84 y.o. male with history of CAD, sleep apnea, hypertension, hyperlipidemia and previous history of UTI with ureteral stone had been removed about a year ago presents to the ER because of fever chills nausea vomiting and dysuria.  Patient has been having the symptoms for last 48 hours.  He had gone to his PCP and was prescribed doxycycline.  After taking which patient had nausea vomiting.  Denies any abdominal pain.  Denies any diarrhea chest pain shortness of breath or productive cough.  ED Course: In the ER patient is found to be hypotensive which improved with fluids.  Lab work showed leukocytosis of 24,000 with severe thrombocytopenia.  No renal failure.  Lactate was elevated.  Blood pressure improved with fluids.  CT scan renal study does not show any obstruction but does show features concerning for UTI.  Patient is being admitted for possible developing sepsis from UTI.  Patient needed to give a sample of urine.  Hospital course / Brief Summary:  SIRS/Sepsis was R/o - from urinary source: CT shows no evidence of  nephrolithiasis.   Improved dramatically with IV fluids and IV antibiotics -Following up with urine cultures, obtained on 03/21/2017 were negative to date -Improved symptomatically, D/C Rocephin,DC vancomycin - Changed to PO Abx of Cipro  -IV Antibiotics were initiated 03/21/2017- ended 03/24/17 -Improved lactic acid from 2.1 to...> 1.4 -Improved WBC from 24.4.....> 5.0    Benign prostatic hypertrophy:  Patient reports significant lower urinary tract symptoms which causes him to withhold urine particularly at night when he has difficulty getting up out of bed. -Start tamsulosin 0.4 mg -Start finasteride 5 mg   Coronary artery disease: -Continue aspirin 81 mg -Continue metoprolol succinate 25 mg daily  Hypertension: -Continue beta-blocker   Thrombocytopenia -Chronic, continue monitor, no evidence of DIC   Ethics - DO NOT RESUSCITATE  Disposition: To discharge home / No organism isolation, on oral antibiotics   ----------------------------------------------------------------------------------------------------------------------  Discharge Instructions:   Discharge Instructions    Activity as tolerated - No restrictions   Complete by:  As directed    Diet - low sodium heart healthy   Complete by:  As directed    Discharge instructions   Complete by:  As directed    Please f/up with Urologist ASAP   Increase activity slowly   Complete by:  As directed        Medication List    TAKE these medications   albuterol 108 (90 Base) MCG/ACT inhaler  Commonly known as:  PROVENTIL HFA;VENTOLIN HFA Inhale 2 puffs into the lungs every 6 (six) hours as needed.   aspirin EC 81 MG tablet Take 1 tablet (81 mg total) by mouth daily. What changed:    when to take this  reasons to take this   ciprofloxacin 500 MG tablet Commonly known as:  CIPRO Take 1 tablet (500 mg total) by mouth 2 (two) times daily for 10 days.   lactobacillus acidophilus Tabs tablet Take 2 tablets by  mouth 3 (three) times daily for 12 days.   metoprolol succinate 25 MG 24 hr tablet Commonly known as:  TOPROL-XL Take 1 tablet (25 mg total) by mouth daily.   multivitamin tablet Take 1 tablet by mouth daily.   nitrofurantoin (macrocrystal-monohydrate) 100 MG capsule Commonly known as:  MACROBID Take 1 capsule by mouth 2 (two) times daily.   TEARS NATURALE OP Place 1-2 drops into both eyes at bedtime as needed (dry eyes).      Follow-up Information    Lucille Passy, MD.   Specialty:  [facility] Contact information: [place] [address] [phone]          Allergies  Allergen Reactions  . Penicillins Hives and Other (See Comments)    [place]ps, passed out Tolerates cephalosporins  Has patient had a PCN reaction causing immediate rash, facial/tongue/throat swelling, SOB or lightheadedness with hypotension:  yes Has patient had a PCN reaction causing severe rash involving mucus membranes or skin necrosis: no Has patient had a PCN reaction that required hospitalization: no Has patient had a PCN reaction occurring within the last 10 years: no If all of the above answers are "NO", then may proceed with Cephalosporin use.       Procedures/Studies: Dg Chest 2 View  Result Date: 03/21/2017 CLINICAL DATA:  Dehydration.  Possible sepsis. EXAM: CHEST  2 VIEW COMPARISON:  Two-view chest x-ray 12/16/2015 FINDINGS: Heart size is normal. Lungs are clear. Thoracolumbar curvature is stable. Surgical clips are present at the gallbladder fossa. IMPRESSION: No acute cardiopulmonary disease or significant interval change. Electronically Signed   By: San Morelle M.D.   On: 03/21/2017 20:32   Ct Renal Stone Study  Result Date: 03/22/2017 CLINICAL DATA:  Urinary tract infection, uncomplicated. Recent diagnosis of urinary tract infection, noncompliant with medical therapy. EXAM: CT ABDOMEN AND PELVIS WITHOUT CONTRAST TECHNIQUE: Multidetector CT imaging  of the abdomen and pelvis was performed following the standard protocol without IV contrast. COMPARISON:  CT 12/16/2015 FINDINGS: Lower chest: Basilar septal thickening suggest pulmonary edema. There mitral annulus calcifications. No significant pleural fluid. Compressive atelectasis in the medial right lower lobe adjacent to large osteophytes. Hepatobiliary: Chronic pneumobilia in the left hepatic lobe and common bile duct. Wedge-shaped hypodensities in the dome of the liver on prior contrast-enhanced exam are not seen. No evidence of new focal lesion. Clips in the gallbladder fossa postcholecystectomy. Pancreas: No ductal dilatation or inflammation. Spleen: Normal in size without focal abnormality. Adrenals/Urinary Tract: No adrenal nodule. Prominence of the bilateral renal pelvis secondary to parapelvic cysts as seen on prior contrast-enhanced CT. No hydronephrosis. No urolithiasis. Minimal symmetric perinephric edema. No evidence of perirenal fluid collection. Urinary bladder is physiologically distended. No definite bladder wall thickening, however streak artifact from right hip arthroplasty partially obscures evaluation. No urinary tract air. Stomach/Bowel: Colonic diverticulosis, extensive in the sigmoid colon, without diverticulitis. Normal appendix. No bowel inflammation, obstruction or evidence of wall thickening. Small hiatal hernia. Stomach physiologically distended. Vascular/Lymphatic: Advanced aortic atherosclerosis  and tortuosity. Mild bi-iliac atherosclerosis. No bulky adenopathy in the abdomen or pelvis, streak artifact from right hip arthroplasty partially obscures right pelvis evaluation. 14 mm splenic artery aneurysm at the hilum versus arterial tortuosity, unchanged from prior exams. Reproductive: Enlarged prostate gland measuring 5.8 x 5.3 cm. Other: No ascites or free air. Minimal fat in the left inguinal canal. No evidence of intra-abdominal abscess. Musculoskeletal: Advanced degenerative  change throughout spine with scoliosis. Right hip arthroplasty. There are no acute or suspicious osseous abnormalities. IMPRESSION: 1. Mild bilateral perinephric edema which may be chronic or seen with urinary tract infection. No urolithiasis. Bilateral renal pelvis prominence secondary to parapelvic cysts as seen on prior contrast-enhanced exams. 2. Colonic diverticulosis without diverticulitis. Aortic Atherosclerosis (ICD10-I70.0). 3. Lung base findings of septal thickening suggests mild pulmonary edema. Electronically Signed   By: Jeb Levering M.D.   On: 03/22/2017 00:32      Subjective: Patient was seen and examined 03/24/2017, 12:42 PM Patient stable  Today. No acute distress.  No issues overnight Stable for discharge.  Discharge Exam:  Vitals:   03/23/17 0434 03/23/17 1502 03/23/17 2122 03/24/17 0528  BP:  (!) 122/55 (!) 128/58 (!) 120/57  Pulse: 79 81 75 85  Resp: 20 (!) 81 18 18  Temp: 97.6 F (36.4 C) 97.9 F (36.6 C) 98.2 F (36.8 C) 97.8 F (36.6 C)  TempSrc: Oral Oral Oral Oral  SpO2: 97% 99% 96% 99%  Weight: 62.5 kg (137 lb 12.6 oz)   60.8 kg (134 lb 0.6 oz)  Height:        General: Pt lying comfortably in bed & appears in no obvious distress. Cardiovascular: S1 & S2 heard, RRR, S1/S2 +. No murmurs, rubs, gallops or clicks. No JVD or pedal edema. Respiratory: Clear to auscultation without wheezing, rhonchi or crackles. No increased work of breathing. Abdominal:  Non distended, non tender & soft. No organomegaly or masses appreciated. Normal bowel sounds heard. CNS: Alert and oriented. No focal deficits. Extremities: no edema, no cyanosis    The results of significant diagnostics from this hospitalization (including imaging, microbiology, ancillary and laboratory) are listed below for reference.     Microbiology: Recent Results (from the past 240 hour(s))  Culture, blood (Routine x 2)     Status: None (Preliminary result)   Collection Time: 03/21/17  7:34 PM    Result Value Ref Range Status   Specimen Description   Final    BLOOD RIGHT FOREARM Performed at [facility], [place] [address], [place], [place] [address]    Special Requests   Final    BOTTLES DRAWN AEROBIC AND ANAEROBIC Blood Culture adequate volume Performed at [facility] [address] [place], [place] [address]    Culture   Final    NO GROWTH 3 DAYS Performed at [facility], [place] [address], [place], [place] [address]    Report Status PENDING  Incomplete  Culture, blood (Routine x 2)     Status: None (Preliminary result)   Collection Time: 03/21/17  7:41 PM  Result Value Ref Range Status   Specimen Description   Final    BLOOD RIGHT ANTECUBITAL Performed at [facility] [address], [place], [place] [address]    Special Requests   Final    BOTTLES DRAWN AEROBIC AND ANAEROBIC Blood Culture adequate volume Performed at [facility] [address], [place], [place] [address]    Culture   Final  NO GROWTH 3 DAYS Performed at [facility], [place] [address] [place], [place] [address]    Report Status PENDING  Incomplete  Culture, Urine     Status: None   Collection Time: 03/22/17  2:57 AM  Result Value Ref Range Status   Specimen Description   Final    URINE, CLEAN CATCH Performed at [facility], [place] [address] [place], [place] [address]    Special Requests   Final    NONE Performed at [facility], [place] [address], [place], [place] [address]    Culture   Final    NO GROWTH Performed at [facility], [place] [address], [place], [place] [address]    Report Status 03/23/2017 FINAL  Final     Labs: CBC: Recent Labs  Lab 03/21/17 1936 03/22/17 0250 03/22/17 0540 03/23/17 0447 03/24/17 0425  WBC 24.4*  --  16.9* 7.4 5.0  NEUTROABS 19.8*  --  12.8*  --   --   HGB 17.2*  --  12.0* 12.3* 12.4*  HCT 47.1  --  36.3* 37.5* 37.9*   MCV 87.5  --  87.9 88.7 87.5  PLT 57* 48* 50* 59* 59*   Basic Metabolic Panel: Recent Labs  Lab 03/21/17 1936 03/22/17 0540 03/23/17 0447 03/24/17 0425  NA 140 141 144 143  K 3.7 3.7 3.9 3.4*  CL 106 112* 115* 110  CO2 23 23 24 25   GLUCOSE 144* 127* 104* 96  BUN 13 16 13 11   CREATININE 1.15 1.10 0.90 0.74  CALCIUM 9.1 7.5* 8.2* 8.4*  MG  --   --  2.0  --    Liver Function Tests: Recent Labs  Lab 03/21/17 1936 03/22/17 0540  AST 19 14*  ALT 15* 11*  ALKPHOS 65 44  BILITOT 1.8* 0.9  PROT 6.0* 4.5*  ALBUMIN 4.1 2.7*  Urinalysis    Component Value Date/Time   COLORURINE YELLOW 03/22/2017 0257   APPEARANCEUR CLEAR 03/22/2017 0257   LABSPEC 1.014 03/22/2017 0257   PHURINE 5.0 03/22/2017 0257   GLUCOSEU NEGATIVE 03/22/2017 0257   HGBUR NEGATIVE 03/22/2017 0257   HGBUR negative 06/02/2009 1059   BILIRUBINUR NEGATIVE 03/22/2017 0257   BILIRUBINUR 1+ 10/08/2016 1013   KETONESUR 5 (A) 03/22/2017 0257   PROTEINUR NEGATIVE 03/22/2017 0257   UROBILINOGEN 0.2 10/08/2016 1013   UROBILINOGEN 1.0 07/01/2013 0450   NITRITE NEGATIVE 03/22/2017 0257   LEUKOCYTESUR TRACE (A) 03/22/2017 0257    Time coordinating discharge: Over 30 minutes  SIGNED: Deatra James, MD, FACP, FHM. [facility],  Pager [phone]  If 7PM-7AM, please contact night-coverage [email or web address] [facility] 03/24/2017, 12:42 PM

## 2017-03-26 LAB — CULTURE, BLOOD (ROUTINE X 2)
CULTURE: NO GROWTH
CULTURE: NO GROWTH
SPECIAL REQUESTS: ADEQUATE
Special Requests: ADEQUATE

## 2017-03-31 ENCOUNTER — Encounter: Payer: Self-pay | Admitting: Primary Care

## 2017-03-31 ENCOUNTER — Ambulatory Visit (INDEPENDENT_AMBULATORY_CARE_PROVIDER_SITE_OTHER): Payer: Medicare Other | Admitting: Primary Care

## 2017-03-31 VITALS — BP 122/74 | HR 62 | Temp 98.0°F | Ht 60.0 in | Wt 134.8 lb

## 2017-03-31 DIAGNOSIS — E538 Deficiency of other specified B group vitamins: Secondary | ICD-10-CM | POA: Diagnosis not present

## 2017-03-31 DIAGNOSIS — D510 Vitamin B12 deficiency anemia due to intrinsic factor deficiency: Secondary | ICD-10-CM | POA: Diagnosis not present

## 2017-03-31 DIAGNOSIS — R972 Elevated prostate specific antigen [PSA]: Secondary | ICD-10-CM | POA: Diagnosis not present

## 2017-03-31 DIAGNOSIS — A419 Sepsis, unspecified organism: Secondary | ICD-10-CM | POA: Diagnosis not present

## 2017-03-31 DIAGNOSIS — R351 Nocturia: Secondary | ICD-10-CM | POA: Diagnosis not present

## 2017-03-31 DIAGNOSIS — E785 Hyperlipidemia, unspecified: Secondary | ICD-10-CM | POA: Diagnosis not present

## 2017-03-31 DIAGNOSIS — I10 Essential (primary) hypertension: Secondary | ICD-10-CM

## 2017-03-31 DIAGNOSIS — D696 Thrombocytopenia, unspecified: Secondary | ICD-10-CM

## 2017-03-31 DIAGNOSIS — N401 Enlarged prostate with lower urinary tract symptoms: Secondary | ICD-10-CM

## 2017-03-31 LAB — VITAMIN B12: Vitamin B-12: >1500 pg/mL — ABNORMAL HIGH (ref 211–911)

## 2017-03-31 MED ORDER — CYANOCOBALAMIN 1000 MCG/ML IJ SOLN
1000.0000 ug | Freq: Once | INTRAMUSCULAR | Status: AC
  Administered 2017-03-31: 1000 ug via INTRAMUSCULAR

## 2017-03-31 NOTE — Patient Instructions (Addendum)
Start weaning off of your metoprolol succinate. Take 1 tablet every other day for 2 weeks, then stop.  Stop by the lab prior to leaving today. I will notify you of your results once received.   Set up monthly B 12 injections at the front desk.   Ear Pressure: Try using the Nasacort nasal spray for one week. Instill 1 spray in each nostril twice daily.   We will see you in June for your wellness visit and physical.   It was a pleasure to see you today!

## 2017-03-31 NOTE — Assessment & Plan Note (Signed)
Check lipids at upcoming CPE. History of atherosclerosis.

## 2017-03-31 NOTE — Progress Notes (Signed)
Subjective:    Patient ID: [name], male    DOB: Jul 13, 1933, 84 y.o.   MRN: [id number]  HPI  [name] is an 84 year old male who presents today to transfer care from Dr. Deborra Medina and hospital follow up.  1) Hospital Follow Up:  Presented to [facility] on 03/21/17 with complaints of fever, chills, vomiting. He'd not been drinking fluids as it caused nausea. He developed dysuria, foul smelling urine, decreased urine output 10 days prior, was treated at Urgent Care with antibiotics (Doxycycline?), but could not tolerate so was then prescribed Macrobid.   During his stay in the ED he was found to have sepsis that was suspected to be secondary to UTI. CBC with WBC of 24, low platelets (57), hyperglycemia, lactic acid elevated (2.12). He was treated with broad spectrum antibiotics, IV fluids, and admitted for further treatment.  During his hospital stay he underwent CT renal study with evidence of UTI vs pyelonephritis without obstruction. His symptoms improved significantly with IV antibiotics and fluids. He was initiated on tamsulosin 0.4 mg and finasteride 5 mg given BPH and inability to pass urine. His platelets were noted to be chronically low, no evidence of DIC. He was discharged home on 03/24/17 with prescription for PO ciprofloxacin. He was instructed to follow up with PCP and Urology.  Since his discharge home he's doing much better. He has an appointment with the Urologist tomorrow. He does struggle with nocturia for which has been chronic for years. He's been working on hydrating but he doesn't like water. He's drinking Gatorade, flavored water, half sweet tea with lemonade mostly. He denies fevers, dysuria, difficulty urinating.   2) BPH: Currently managed on tamsulosin 0.4 mg and finasteride 5 mg of which were initiated during his recent hospital stay. History of renal stones.   3) Essential Hypertension/Coronary atherosclerosis: Currently managed on metoprolol succinate 25 mg. He's  "experimented" by not taking his metoprolol succinate for periods of time and hasn't noticed an elevation in readings. Diagnosed with CHF in 1996 and was also managed on doxazosin which caused hypotension.   Normal myoview stress test in 2015. Chest xray from recent hospital stay with normal heart size. He denies history of CAD, MI, arrhythmias. He would like to come off of metoprolol.   4) Thrombocytopenia: Long history of low platelets ranging from 50's-180;s for years. Once following with hematology (released several years ago) and was managed on B 12 monthly for years. He stopped his B12 injections 3 months ago as he forgot. He was diagnosed with pernicious anemia in the past.   5) Ear Fullness: Located to bilateral ears for which has been present for the past 2 days. He's used a Neti Pot once to his nose without improvement. He's been on IV and oral antibiotics for the past one week. He does have hearing aids for which he forgot to wear today.  Review of Systems  Constitutional: Negative for fever.  HENT:       Bilateral ear fullness  Respiratory: Negative for shortness of breath.   Cardiovascular: Negative for chest pain and palpitations.  Genitourinary: Negative for decreased urine volume, difficulty urinating, dysuria, hematuria and urgency.       Nocturia  Neurological: Negative for dizziness and headaches.       Past Medical History:  Diagnosis Date  . Arthralgia of multiple joints    limited mobility w/  independant adl's  . BPH with obstruction/lower urinary tract symptoms   . Chronic idiopathic monocytosis  followed by dr Waymon Budge--  per lov note 06/ 2017 persistant unexplained  with normal bone barrow bx  . Coronary atherosclerosis of unspecified type of vessel, native or graft    cardiologist-  dr Stanford Breed -- per lov note 11-19-2014 ,  cardiac cath in 2000-- pLAD 60-70%,  small intermediate branch 70-80%,  mRCA 20%,  normal LV  . Degenerative arthritis of spine     cervical and lumar  . Diverticulosis of colon (without mention of hemorrhage)   . Dysuria    chronic  . Elevated PSA   . Feeling of incomplete bladder emptying   . Hiatal hernia    moderate per ct 11/ 2017  . History of adenomatous polyp of colon    2008  . History of atrial fibrillation    remote hx episode  . History of pancreatitis    07-01-2013  and 10-10-2015  . History of prostatitis    2014  . Hyperlipidemia   . Hypertension   . Mouth sore    roof of mouth sore   . Nephrolithiasis   . Nocturia more than twice per night    severe  w/ leakage  . OSA (obstructive sleep apnea)    INTOLERANT CPAP  . Osteoarthritis   . Pernicious anemia    B12  Def.  . Peyronie disease   . Renal insufficiency   . Thrombocytopenia, unspecified ([place]) hemotology/oncologist-  dr Waymon Budge   per dr Velta Addison note 06/ 2016  secondary to vitro clumping     Social History   Socioeconomic History  . Marital status: Widowed    Spouse name: Not on file  . Number of children: 2  . Years of education: Not on file  . Highest education level: Not on file  Social Needs  . Financial resource strain: Not on file  . Food insecurity - worry: Not on file  . Food insecurity - inability: Not on file  . Transportation needs - medical: Not on file  . Transportation needs - non-medical: Not on file  Occupational History  . Occupation: retired    Fish farm manager: RETIRED  Tobacco Use  . Smoking status: Former Smoker    Types: Pipe    Last attempt to quit: 01/18/1966    Years since quitting: 51.2  . Smokeless tobacco: Never Used  Substance and Sexual Activity  . Alcohol use: No    Alcohol/week: 0.0 oz  . Drug use: No  . Sexual activity: No  Other Topics Concern  . Not on file  Social History Narrative   DNR   Widowed   2 daughters leave near by    Past Surgical History:  Procedure Laterality Date  . BONE MARROW BIOPSY  2011   normal  . CARDIAC CATHETERIZATION  2000   per dr Kathyrn Drown note --  dLAD 60-70%,  small intermediate branch 70-80%,  mRCA 20%,  normal LV  . CARDIOVASCULAR STRESS TEST  01-29-2013   dr Stanford Breed   normal nuclear study w/ no ischemia,  normal LV function and wall motion , ef 82%  . CATARACT EXTRACTION W/ INTRAOCULAR LENS  IMPLANT, BILATERAL  08/2015  . CHOLECYSTECTOMY  11/24/2010   Procedure: LAPAROSCOPIC CHOLECYSTECTOMY WITH INTRAOPERATIVE CHOLANGIOGRAM;  Surgeon: Earnstine Regal, MD;  Location: [facility];  Service: General;  Laterality: N/A;  c-arm  . CYSTOSCOPY W/ URETERAL STENT PLACEMENT Left 12/17/2015   Procedure: CYSTOSCOPY WITH RETROGRADE PYELOGRAM/URETERAL STENT PLACEMENT;  Surgeon: Ardis Hughs, MD;  Location: [facility];  Service:  Urology;  Laterality: Left;  . CYSTOSCOPY WITH RETROGRADE PYELOGRAM, URETEROSCOPY AND STENT PLACEMENT Left 12/22/2015   Procedure: CYSTOSCOPY WITH LEFT RETROGRADE  URETEROSCOPY AND STENT PLACEMENT;  Surgeon: Carolan Clines, MD;  Location[facility];  Service: Urology;  Laterality: Left;  . ERCP N/A 07/04/2013   Procedure: ENDOSCOPIC RETROGRADE CHOLANGIOPANCREATOGRAPHY (ERCP);  Surgeon: Gatha Mayer, MD;  Location: [facility];  Service: Endoscopy;  Laterality: N/A;  MAC if available  . ERCP N/A 10/12/2015   Procedure: ENDOSCOPIC RETROGRADE CHOLANGIOPANCREATOGRAPHY (ERCP);  Surgeon: [name], MD;  Location: [facility];  Service: Endoscopy;  Laterality: N/A;  . HOLMIUM LASER APPLICATION Left 17/05/1023   Procedure: HOLMIUM LASER APPLICATION;  Surgeon: Carolan Clines, MD;  Location: [facility];  Service: Urology;  Laterality: Left;  . INGUINAL HERNIA REPAIR Right 01/25/2002  . KNEE ARTHROSCOPY  x5  . SATURATION BIOPSY OF PROSTATE  05-29-2007  and 01-26-2008  . SHOULDER ARTHROSCOPY WITH OPEN ROTATOR CUFF REPAIR AND DISTAL CLAVICLE ACROMINECTOMY Right 11/19/2004  . TOTAL HIP ARTHROPLASTY Right 05/21/2009  . TOTAL KNEE ARTHROPLASTY Bilateral left 05-02-2003/  right  03-23-2010  .  TRANSTHORACIC ECHOCARDIOGRAM  12/23/2004   ef 60%, mild MV calcification without stenosis/  mild TR,  PASP 15mHg  . UVULOPALATOPHARYNGOPLASTY  1993    w/  T & A    Family History  Problem Relation Age of Onset  . Colon cancer Father 468 . Peripheral vascular disease Mother     Allergies  Allergen Reactions  . Penicillins Hives and Other (See Comments)    [place], passed out Tolerates cephalosporins  Has patient had a PCN reaction causing immediate rash, facial/tongue/throat swelling, SOB or lightheadedness with hypotension:  yes Has patient had a PCN reaction causing severe rash involving mucus membranes or skin necrosis: no Has patient had a PCN reaction that required hospitalization: no Has patient had a PCN reaction occurring within the last 10 years: no If all of the above answers are "NO", then may proceed with Cephalosporin use.     Current Outpatient Medications on File Prior to Visit  Medication Sig Dispense Refill  . albuterol (PROVENTIL HFA;VENTOLIN HFA) 108 (90 Base) MCG/ACT inhaler Inhale 2 puffs into the lungs every 6 (six) hours as needed. 1 Inhaler 0  . Artificial Tear Solution (TEARS NATURALE OP) Place 1-2 drops into both eyes at bedtime as needed (dry eyes).    .Marland Kitchenaspirin EC 81 MG tablet Take 1 tablet (81 mg total) by mouth daily. (Patient taking differently: Take 81 mg by mouth every 4 (four) hours as needed for mild pain. )    . ciprofloxacin (CIPRO) 500 MG tablet Take 1 tablet (500 mg total) by mouth 2 (two) times daily for 10 days. 10 tablet 0  . finasteride (PROSCAR) 5 MG tablet Take 1 tablet (5 mg total) by mouth daily. 30 tablet 1  . lactobacillus acidophilus (BACID) TABS tablet Take 2 tablets by mouth 3 (three) times daily for 12 days. 72 tablet 0  . metoprolol succinate (TOPROL-XL) 25 MG 24 hr tablet Take 1 tablet (25 mg total) by mouth daily. 90 tablet 3  . Multiple Vitamin (MULTIVITAMIN) tablet Take 1 tablet by mouth daily.    . tamsulosin (FLOMAX) 0.4  MG CAPS capsule Take 1 capsule (0.4 mg total) by mouth daily after supper. 30 capsule 1   No current facility-administered medications on file prior to visit.     BP 122/74   Pulse 62   Temp 98 F (36.7  C) (Oral)   Ht 5' (1.524 m)   Wt 134 lb 12 oz (61.1 kg)   SpO2 98%   BMI 26.32 kg/m    Objective:   Physical Exam  Constitutional: He is oriented to person, place, and time. He appears well-nourished.  HENT:  Right Ear: Tympanic membrane is bulging. Tympanic membrane is not erythematous.  Left Ear: Tympanic membrane is retracted. Tympanic membrane is not erythematous.  Neck: Neck supple.  Cardiovascular: Normal rate and regular rhythm.  Pulmonary/Chest: Effort normal and breath sounds normal. He has no wheezes. He has no rales.  Neurological: He is alert and oriented to person, place, and time.  Skin: Skin is warm and dry.  Psychiatric: He has a normal mood and affect.          Assessment & Plan:  Ear Fullness:  Located to bilateral ears x 2 days. Exam today without evidence of infection, TM rupture, cerumen impaction. Will have him restart Nasacort BID x 1 week. He will schedule a visit with ENT if no improvement.   Pleas Koch, NP

## 2017-03-31 NOTE — Assessment & Plan Note (Signed)
Urosepsis with recent hospital stay. Improved significantly with IV antibiotics, IV fluids, BPH treatment.  Encouraged him to work on water intake as much as possible.  Continue oral antibiotics, tamsulosin, finasteride as prescribed.  Follow up with Urology as scheduled.  All hospital notes, imaging, labs reviewed.

## 2017-03-31 NOTE — Assessment & Plan Note (Signed)
Resume IM vitamin B 12 injections monthly.  Repeat B12 lab today.

## 2017-03-31 NOTE — Assessment & Plan Note (Signed)
Chronic for years, once following with hematology who released him years ago.  Resume B 12 injections for pernicious anemia.  Continue to monitor CBC.

## 2017-03-31 NOTE — Assessment & Plan Note (Signed)
Recent PSA of 4.25, given age this is within the normal range. Also with current BPH symptoms and recent admission for Urosepsis.

## 2017-03-31 NOTE — Assessment & Plan Note (Signed)
Stable in the office today.  Will wean off of metoprolol slowly as he takes this infrequently and wishes to stop taking.  Normal stress test from 2015, no evidence of CHF in chart, recent xray without cardiomegaly.  He will monitor BP and HR and report elevated readings or tachycardia.

## 2017-03-31 NOTE — Assessment & Plan Note (Signed)
Will be seeing Urology tomorrow. Continue tamsulosin and finasteride.

## 2017-04-01 DIAGNOSIS — N401 Enlarged prostate with lower urinary tract symptoms: Secondary | ICD-10-CM | POA: Diagnosis not present

## 2017-04-01 DIAGNOSIS — R3915 Urgency of urination: Secondary | ICD-10-CM | POA: Diagnosis not present

## 2017-04-01 DIAGNOSIS — R3911 Hesitancy of micturition: Secondary | ICD-10-CM | POA: Diagnosis not present

## 2017-04-28 DIAGNOSIS — H6980 Other specified disorders of Eustachian tube, unspecified ear: Secondary | ICD-10-CM | POA: Diagnosis not present

## 2017-04-28 DIAGNOSIS — H6063 Unspecified chronic otitis externa, bilateral: Secondary | ICD-10-CM | POA: Diagnosis not present

## 2017-04-28 DIAGNOSIS — H903 Sensorineural hearing loss, bilateral: Secondary | ICD-10-CM | POA: Diagnosis not present

## 2017-05-27 ENCOUNTER — Other Ambulatory Visit: Payer: Self-pay

## 2017-05-27 ENCOUNTER — Ambulatory Visit: Payer: Medicare Other | Attending: Otolaryngology | Admitting: Speech Pathology

## 2017-05-27 ENCOUNTER — Encounter: Payer: Self-pay | Admitting: Speech Pathology

## 2017-05-27 DIAGNOSIS — R490 Dysphonia: Secondary | ICD-10-CM | POA: Insufficient documentation

## 2017-05-27 DIAGNOSIS — R351 Nocturia: Principal | ICD-10-CM

## 2017-05-27 DIAGNOSIS — N401 Enlarged prostate with lower urinary tract symptoms: Secondary | ICD-10-CM

## 2017-05-27 MED ORDER — TAMSULOSIN HCL 0.4 MG PO CAPS: 0.4000 mg | ORAL_CAPSULE | Freq: Every day | ORAL | 0 refills | Status: DC

## 2017-05-27 NOTE — Telephone Encounter (Signed)
[name] from [facility] left v/m requesting refill flomax.; flomax was on hx med list last filled by Skipper Cliche MD with [facility]. Pt established care on 03/31/17.Please advise.

## 2017-05-27 NOTE — Telephone Encounter (Signed)
Noted, refill sent to pharmacy. 

## 2017-05-27 NOTE — Therapy (Signed)
[facility] MAIN [facility] SERVICES [address] [place], [place], [address] Phone: [phone]   Fax:  [phone]  Speech Language Pathology Evaluation  Patient Details  Name: [name] MRN: [id number] Date of Birth: Jun 20, 1933 Referring Provider: Dr. Andee Poles   Encounter Date: 05/27/2017  End of Session - 05/27/17 1130    Visit Number  1    Number of Visits  7    Date for SLP Re-Evaluation  07/08/17    SLP Start Time  0957    SLP Stop Time   1100    SLP Time Calculation (min)  63 min    Activity Tolerance  Patient tolerated treatment well       Past Medical History:  Diagnosis Date  . Arthralgia of multiple joints    limited mobility w/  independant adl's  . BPH with obstruction/lower urinary tract symptoms   . Chronic idiopathic monocytosis    followed by dr Marlena Clipper--  per lov note 06/ 2017 persistant unexplained  with normal bone barrow bx  . Coronary atherosclerosis of unspecified type of vessel, native or graft    cardiologist-  dr Jens Som -- per lov note 11-19-2014 ,  cardiac cath in 2000-- pLAD 60-70%,  small intermediate branch 70-80%,  mRCA 20%,  normal LV  . Degenerative arthritis of spine    cervical and lumar  . Diverticulosis of colon (without mention of hemorrhage)   . Dysuria    chronic  . Elevated PSA   . Feeling of incomplete bladder emptying   . Hiatal hernia    moderate per ct 11/ 2017  . History of adenomatous polyp of colon    2008  . History of atrial fibrillation    remote hx episode  . History of pancreatitis    07-01-2013  and 10-10-2015  . History of prostatitis    2014  . Hx of colonic polyps   . Hyperlipidemia   . Hypertension   . Mouth sore    roof of mouth sore   . Nephrolithiasis   . Nocturia more than twice per night    severe  w/ leakage  . OSA (obstructive sleep apnea)    INTOLERANT CPAP  . Osteoarthritis   . Pernicious anemia    B12  Def.  . Peyronie disease   . Renal  insufficiency   . Thrombocytopenia, unspecified (HCC) hemotology/oncologist-  dr Marlena Clipper   per dr Lynnae January note 06/ 2016  secondary to vitro clumping    Past Surgical History:  Procedure Laterality Date  . BONE MARROW BIOPSY  2011   normal  . CARDIAC CATHETERIZATION  2000   per dr Spero Curb note --  dLAD 60-70%,  small intermediate branch 70-80%,  mRCA 20%,  normal LV  . CARDIOVASCULAR STRESS TEST  01-29-2013   dr Jens Som   normal nuclear study w/ no ischemia,  normal LV function and wall motion , ef 82%  . CATARACT EXTRACTION W/ INTRAOCULAR LENS  IMPLANT, BILATERAL  08/2015  . CHOLECYSTECTOMY  11/24/2010   Procedure: LAPAROSCOPIC CHOLECYSTECTOMY WITH INTRAOPERATIVE CHOLANGIOGRAM;  Surgeon: Velora Heckler, MD;  Location: [facility];  Service: General;  Laterality: N/A;  c-arm  . CYSTOSCOPY W/ URETERAL STENT PLACEMENT Left 12/17/2015   Procedure: CYSTOSCOPY WITH RETROGRADE PYELOGRAM/URETERAL STENT PLACEMENT;  Surgeon: Crist Fat, MD;  Location: [facility];  Service: Urology;  Laterality: Left;  . CYSTOSCOPY WITH RETROGRADE PYELOGRAM, URETEROSCOPY AND STENT PLACEMENT Left 12/22/2015   Procedure: CYSTOSCOPY WITH LEFT  RETROGRADE  URETEROSCOPY AND STENT PLACEMENT;  Surgeon: Jethro Bolus, MD;  Location: [facility];  Service: Urology;  Laterality: Left;  . ERCP N/A 07/04/2013   Procedure: ENDOSCOPIC RETROGRADE CHOLANGIOPANCREATOGRAPHY (ERCP);  Surgeon: Iva Boop, MD;  Location: [facility];  Service: Endoscopy;  Laterality: N/A;  MAC if available  . ERCP N/A 10/12/2015   Procedure: ENDOSCOPIC RETROGRADE CHOLANGIOPANCREATOGRAPHY (ERCP);  Surgeon: Rachael Fee, MD;  Location: [facility];  Service: Endoscopy;  Laterality: N/A;  . HOLMIUM LASER APPLICATION Left 12/22/2015   Procedure: HOLMIUM LASER APPLICATION;  Surgeon: Jethro Bolus, MD;  Location: [facility];  Service: Urology;  Laterality: Left;  . INGUINAL HERNIA REPAIR Right 01/25/2002   . KNEE ARTHROSCOPY  x5  . SATURATION BIOPSY OF PROSTATE  05-29-2007  and 01-26-2008  . SHOULDER ARTHROSCOPY WITH OPEN ROTATOR CUFF REPAIR AND DISTAL CLAVICLE ACROMINECTOMY Right 11/19/2004  . TOTAL HIP ARTHROPLASTY Right 05/21/2009  . TOTAL KNEE ARTHROPLASTY Bilateral left 05-02-2003/  right  03-23-2010  . TRANSTHORACIC ECHOCARDIOGRAM  12/23/2004   ef 60%, mild MV calcification without stenosis/  mild TR,  PASP  . UVULOPALATOPHARYNGOPLASTY  1993    w/  T & A    There were no vitals filed for this visit.  Subjective Assessment - 05/27/17 1021    Subjective  Pt was pleasant and agreeable to evaluation. Pt very talkative throughout evaluation.    Currently in Pain?  No/denies         SLP Evaluation [facility] - 05/27/17 1131      SLP Visit Information   SLP Received On  05/27/17    Referring Provider  Dr. Andee Poles    Onset Date  3 months ago    Medical Diagnosis  Prebylaryngis      Subjective   Subjective  The patient was cooperative and agreeable to evaluation    Patient/Family Stated Goal  Pt stated he would like to have a stronger and more pleasant voice.       Pain Assessment   Currently in Pain?  No/denies      General Information   HPI  [name] is an 84 y/o male whom presents after evalution from ENT for hearing loss and hoarseness. Pt has h/o of a uvulectomy and tonsillectomy. Pt reports he had some hoarseness following an EGD 2 years ago but he hoarseness has worsened over the last 3 months. Pt also has a h/o arthritis and anemia and was recently hospitalized following a UTI.     Behavioral/Cognition  WFL    Mobility Status  ambulatory      Balance Screen   Has the patient fallen in the past 6 months  No    Has the patient had a decrease in activity level because of a fear of falling?   No    Is the patient reluctant to leave their home because of a fear of falling?   No      Prior Functional Status   Cognitive/Linguistic Baseline  Information not available     Type of Home  House     Lives With  Alone    Vocation  Retired      Oral Motor/Sensory Function   Overall Oral Motor/Sensory Function  Appears within functional limits for tasks assessed    Labial ROM  Within Functional Limits    Labial Symmetry  Within Functional Limits    Labial Strength  Within Functional Limits    Labial Sensation  Within Functional Limits  Labial Coordination  WFL    Lingual ROM  Within Functional Limits    Lingual Symmetry  Within Functional Limits    Lingual Strength  Within Functional Limits    Lingual Sensation  Within Functional Limits    Lingual Coordination  WFL    Facial ROM  Within Functional Limits    Facial Symmetry  Within Functional Limits    Facial Strength  Within Functional Limits    Facial Sensation  Within Functional Limits    Facial Coordination  WFL    Velum  Within Functional Limits Noted uvulectomy    Mandible  Within Functional Limits    Overall Oral Motor/Sensory Function  WFL      Standardized Assessments   Standardized Assessments   Other Assessment    Other Assessment  Perceptual Voice Evaluation          Perceptual Voice Evaluation  Voice history: Pt reports that his voice became gradually more hoarse about 3 months ago, however he did have some hoarseness that began with EGD 2 years ago.  Voice Checklist:  Health Risks: Pt reports some significant allergies, caffeine use and very poor water intake.  Characteristic voice use: Pt does sing in the church choir.  Environmental risk: Pt reports a dry environment  Misuse: Excessive talking reported  Abuse: Pt reports talking with allergy symptoms and raising voice to speak in noise.  Vocal characteristics: Pt reports limited pitch range, decreased intensity, hoarseness, and changes in usual pitch.   Patient Quality of Life Survey: Voice Handicap Index - Pt scored a 22. (Ascore of 10 or higher indicates a perceived handicap)  Max phonation time for sustained "ah":   15.53 Mean intensity during sustained "ah": 66 dB Mean intensity sustained during conversational speech: 70 dB Average fundamental frequency during sustained "ah": 145.223 (Norm 140.748) Average time patient was able to sustain /s/: 8.52 Average time patient was able to sustain /z/: 15.84 S/z ratio: 0.53 Highest dynamic pitch when altering from a low to high note: 321.06 Hz Lowest dynamic pitch when altering from a low to high note: 84.32 Hz Highest pitch in connected speech: 371 Hz Lowest pitch in connected speech: 95 Hz   Visi-Pitch: Multi-dimensional Voice Program (MDVP) MDVP extracts objective quantitative values (Relative Average Perturbation, Shimmer, Voice Turbulence Index, and Noise to Harmonic Ratio) on sustained phonation, which are displayed graphically and numerically in comparison to a built-in normative data base. The patient exhibited values which were mostly within the norms set for Relative Average Perturbation, Shimmer, Voice Turbulence Index, and Noise to Harmonic Ratio.              SLP Education - 05/27/17 1128    Education provided  Yes    Education Details  The role of SLP in treating dysphonia.     Person(s) Educated  Patient    Methods  Explanation    Comprehension  Verbalized understanding         SLP Long Term Goals - 05/27/17 1133      SLP LONG TERM GOAL #1   Title  The patient will demonstrate independent understanding of vocal hygiene concepts as by demonstrating and teach back with 80% accuracy.    Baseline  Pt is unknowledgeable re: vocal hygiene concepts.     Time  6    Period  Weeks    Status  New    Target Date  07/08/17      SLP LONG TERM GOAL #2   Title  The patient  will be independent for abdominal breathing and breath support exercises by demonstrating with 80% accuracy w/prompts as needed.    Baseline  Unable to perform adequately    Time  6    Period  Weeks    Status  New    Target Date  07/08/17      SLP LONG TERM GOAL #3    Title  The patient will maximize voice quality and loudness using breath support for sustained vowel production, pitch glides, and hierarchal speech drill w/80% acc.     Baseline  0    Time  6    Period  Weeks    Status  New    Target Date  07/08/17      SLP LONG TERM GOAL #4   Title  The patient will maximize voice quality and loudness using breath support for paragraph length recitation with 80% accuracy.    Baseline  0    Time  6    Period  Weeks    Status  New    Target Date  07/08/17       Plan - 06/18/2017 1131    Clinical Impression Statement  This 84 year old male under the care of Dr. Andee Poles, with a diagnosis of prebylaryngis, is presenting with mild-moderate dysphonia. The patient demonstrates hoarse vocal quality, reduced breath control for speech, limited pitch range, and decreased intensity. He will benefit from voice therapy for education, to improve breath support, improve tone focus, and learn techniques to increase loudness and pitch range without strain.     Speech Therapy Frequency  1x /week    Duration  Other (comment) 6 weeks    Treatment/Interventions  SLP instruction and feedback;Patient/family education;Compensatory techniques;Cueing hierarchy    Potential to Achieve Goals  Good    Potential Considerations  Ability to learn/carryover information    SLP Home Exercise Plan  Vocal hygiene, diaphragmatic breathing, vocal exercises    Consulted and Agree with Plan of Care  Patient       Patient will benefit from skilled therapeutic intervention in order to improve the following deficits and impairments:   Dysphonia  G-Codes - June 18, 2017 1142    Functional Assessment Tool Used  Clinical judgement, Visi-pitch     Functional Limitations  Voice    Voice Current Status (G9171)  At least 20 percent but less than 40 percent impaired, limited or restricted    Voice Goal Status (G9172)  At least 20 percent but less than 40 percent impaired, limited or restricted        Problem List Patient Active Problem List   Diagnosis Date Noted  . Sepsis (HCC) 03/22/2017  . Visit for well man health check 07/06/2016  . Dysphagia 07/06/2016  . BPH (benign prostatic hyperplasia) 12/22/2015  . Depression 03/12/2015  . Choledocholithiasis with obstruction 07/02/2013  . Thrombocytopenia (HCC) 09/01/2012  . DNR (do not resuscitate) 06/06/2012  . ANEMIA, PERNICIOUS 01/03/2009  . Coronary atherosclerosis 12/12/2007  . HLD (hyperlipidemia) 06/14/2006  . Essential hypertension 06/14/2006  . DIVERTICULOSIS, COLON 06/14/2006  . Elevated PSA, less than 10 ng/ml 06/14/2006  . DEGENERATIVE DISC DISEASE 06/14/2006  . Sleep apnea 06/14/2006    Marquite Attwood Jun 18, 2017, 12:39 PM  [place] [facility] MAIN [facility] SERVICES [address] [place], [place], [address] Phone: [phone]   Fax:  [phone]  Name: VAHAN SHINDLER MRN: [id number] Date of Birth: 1933/09/03

## 2017-05-31 ENCOUNTER — Ambulatory Visit: Payer: Medicare Other | Admitting: Speech Pathology

## 2017-05-31 ENCOUNTER — Encounter: Payer: Self-pay | Admitting: Speech Pathology

## 2017-05-31 DIAGNOSIS — R490 Dysphonia: Secondary | ICD-10-CM | POA: Diagnosis not present

## 2017-05-31 NOTE — Therapy (Signed)
[place] MAIN [facility] SERVICES [address] [place], [place], [address] Phone: [phone]   Fax:  [phone]  Speech Language Pathology Treatment/Discharge Summary  Speech Therapy Progress Note   Dates of reporting period  05/27/2017   to   05/31/2017   Patient Details  Name: [name] MRN: [id number] Date of Birth: [birth year] Referring Provider: Dr. Pryor Ochoa   Encounter Date: 05/31/2017  End of Session - 05/31/17 1348    Visit Number  2    Number of Visits  7    Date for SLP Re-Evaluation  07/08/17    SLP Start Time  0900    SLP Stop Time   0950    SLP Time Calculation (min)  50 min    Activity Tolerance  Patient tolerated treatment well       Past Medical History:  Diagnosis Date  . Arthralgia of multiple joints    limited mobility w/  independant adl's  . BPH with obstruction/lower urinary tract symptoms   . Chronic idiopathic monocytosis    followed by dr Waymon Budge--  per lov note 06/ 2017 persistant unexplained  with normal bone barrow bx  . Coronary atherosclerosis of unspecified type of vessel, native or graft    cardiologist-  dr Stanford Breed -- per lov note 11-19-2014 ,  cardiac cath in 2000-- pLAD 60-70%,  small intermediate branch 70-80%,  mRCA 20%,  normal LV  . Degenerative arthritis of spine    cervical and lumar  . Diverticulosis of colon (without mention of hemorrhage)   . Dysuria    chronic  . Elevated PSA   . Feeling of incomplete bladder emptying   . Hiatal hernia    moderate per ct 11/ 2017  . History of adenomatous polyp of colon    2008  . History of atrial fibrillation    remote hx episode  . History of pancreatitis    07-01-2013  and 10-10-2015  . History of prostatitis    2014  . Hx of colonic polyps   . Hyperlipidemia   . Hypertension   . Mouth sore    roof of mouth sore   . Nephrolithiasis   . Nocturia more than twice per night    severe  w/ leakage  . OSA (obstructive sleep apnea)     INTOLERANT CPAP  . Osteoarthritis   . Pernicious anemia    B12  Def.  . Peyronie disease   . Renal insufficiency   . Thrombocytopenia, unspecified ([place]) hemotology/oncologist-  dr Waymon Budge   per dr Velta Addison note 06/ 2016  secondary to vitro clumping    Past Surgical History:  Procedure Laterality Date  . BONE MARROW BIOPSY  2011   normal  . CARDIAC CATHETERIZATION  2000   per dr Kathyrn Drown note --  dLAD 60-70%,  small intermediate branch 70-80%,  mRCA 20%,  normal LV  . CARDIOVASCULAR STRESS TEST  01-29-2013   dr Stanford Breed   normal nuclear study w/ no ischemia,  normal LV function and wall motion , ef 82%  . CATARACT EXTRACTION W/ INTRAOCULAR LENS  IMPLANT, BILATERAL  08/2015  . CHOLECYSTECTOMY  11/24/2010   Procedure: LAPAROSCOPIC CHOLECYSTECTOMY WITH INTRAOPERATIVE CHOLANGIOGRAM;  Surgeon: Earnstine Regal, MD;  Location: [facility];  Service: General;  Laterality: N/A;  c-arm  . CYSTOSCOPY W/ URETERAL STENT PLACEMENT Left 12/17/2015   Procedure: CYSTOSCOPY WITH RETROGRADE PYELOGRAM/URETERAL STENT PLACEMENT;  Surgeon: Ardis Hughs, MD;  Location: [facility];  Service: Urology;  Laterality: Left;  . CYSTOSCOPY WITH RETROGRADE PYELOGRAM, URETEROSCOPY AND STENT PLACEMENT Left 12/22/2015   Procedure: CYSTOSCOPY WITH LEFT RETROGRADE  URETEROSCOPY AND STENT PLACEMENT;  Surgeon: Carolan Clines, MD;  Location: [facility];  Service: Urology;  Laterality: Left;  . ERCP N/A 07/04/2013   Procedure: ENDOSCOPIC RETROGRADE CHOLANGIOPANCREATOGRAPHY (ERCP);  Surgeon: Gatha Mayer, MD;  Location: [facility];  Service: Endoscopy;  Laterality: N/A;  MAC if available  . ERCP N/A 10/12/2015   Procedure: ENDOSCOPIC RETROGRADE CHOLANGIOPANCREATOGRAPHY (ERCP);  Surgeon: [name], MD;  Location: [facility];  Service: Endoscopy;  Laterality: N/A;  . HOLMIUM LASER APPLICATION Left 87/08/6765   Procedure: HOLMIUM LASER APPLICATION;  Surgeon: Carolan Clines, MD;  Location:  [facility];  Service: Urology;  Laterality: Left;  . INGUINAL HERNIA REPAIR Right 01/25/2002  . KNEE ARTHROSCOPY  x5  . SATURATION BIOPSY OF PROSTATE  05-29-2007  and 01-26-2008  . SHOULDER ARTHROSCOPY WITH OPEN ROTATOR CUFF REPAIR AND DISTAL CLAVICLE ACROMINECTOMY Right 11/19/2004  . TOTAL HIP ARTHROPLASTY Right 05/21/2009  . TOTAL KNEE ARTHROPLASTY Bilateral left 05-02-2003/  right  03-23-2010  . TRANSTHORACIC ECHOCARDIOGRAM  12/23/2004   ef 60%, mild MV calcification without stenosis/  mild TR,  PASP 43mHg  . UVULOPALATOPHARYNGOPLASTY  1993    w/  T & A    There were no vitals filed for this visit.  Subjective Assessment - 05/31/17 1347    Subjective   Pt very talkative throughout treatment session.  Pt requests discharge at this time            ADULT SLP TREATMENT - 05/31/17 0001      General Information   Behavior/Cognition  Alert;Cooperative;Pleasant mood    HPI  Mr. SHairis an 84y/o male whom presents after evalution from ENT for hearing loss and hoarseness. Pt has h/o of a uvulectomy and tonsillectomy. Pt reports he had some hoarseness following an EGD 2 years ago but he hoarseness has worsened over the last 3 months. Pt also has a h/o arthritis and anemia and was recently hospitalized following a UTI.        Treatment Provided   Treatment provided  Cognitive-Linquistic      Pain Assessment   Pain Assessment  No/denies pain      Cognitive-Linquistic Treatment   Treatment focused on  Voice    Skilled Treatment  The patient was provided with written and verbal teaching regarding neck, shoulder, tongue, and throat stretches exercises to promote relaxed phonation. The patient was provided with written and verbal teaching regarding breath support exercises.  Patient instructed in relaxed phonation / oral resonance. Patient instructed in voice building home exercise program.        Assessment / Recommendations / Plan   Plan  Discharge SLP treatment due  to (comment);Other (Comment) Patient request      Progression Toward Goals   Progression toward goals  Goals met, education completed, patient discharged from [facility]Education - 05/31/17 1348    Education provided  Yes    Education Details  Voice buidling home exercise program    Person(s) Educated  Patient    Methods  Explanation;Handout;Demonstration    Comprehension  Verbalized understanding;Returned demonstration         SLP Long Term Goals - 05/31/17 1350      SLP LONG TERM GOAL #1   Title  The patient will demonstrate independent understanding of vocal hygiene concepts as by demonstrating  and teach back with 80% accuracy.    Status  Achieved      SLP LONG TERM GOAL #2   Title  The patient will be independent for abdominal breathing and breath support exercises by demonstrating with 80% accuracy w/prompts as needed.    Status  Achieved      SLP LONG TERM GOAL #3   Title  The patient will maximize voice quality and loudness using breath support for sustained vowel production, pitch glides, and hierarchal speech drill w/80% acc.     Status  Achieved      SLP LONG TERM GOAL #4   Title  The patient will maximize voice quality and loudness using breath support for paragraph length recitation with 80% accuracy.    Status  Achieved       Plan - 05/31/17 1348    Clinical Impression Statement  The patient is generating a strong voice today with inconsistent and minimal hoarseness.  He states that he thinks that when he saw Dr. Pryor Ochoa, his allergies were "acting up".  He was given a voice building exercise program and states that he will be able to work on his voice independently.  He has my card if he has any questions.  Will discontinue speech therapy per patient request.    Speech Therapy Frequency  Other (comment) Discharge    Treatment/Interventions  SLP instruction and feedback;Patient/family education;Compensatory techniques;Cueing hierarchy    Potential to Achieve  Goals  Good    Potential Considerations  Ability to learn/carryover information    SLP Home Exercise Plan  Voice Building Routine    Consulted and Agree with Plan of Care  Patient       Patient will benefit from skilled therapeutic intervention in order to improve the following deficits and impairments:   Dysphonia    Problem List Patient Active Problem List   Diagnosis Date Noted  . Sepsis ([place]) 03/22/2017  . Visit for well man health check 07/06/2016  . Dysphagia 07/06/2016  . BPH (benign prostatic hyperplasia) 12/22/2015  . Depression 03/12/2015  . Choledocholithiasis with obstruction 07/02/2013  . Thrombocytopenia ([place]) 09/01/2012  . DNR (do not resuscitate) 06/06/2012  . ANEMIA, PERNICIOUS 01/03/2009  . Coronary atherosclerosis 12/12/2007  . HLD (hyperlipidemia) 06/14/2006  . Essential hypertension 06/14/2006  . DIVERTICULOSIS, COLON 06/14/2006  . Elevated PSA, less than 10 ng/ml 06/14/2006  . DEGENERATIVE DISC DISEASE 06/14/2006  . Sleep apnea 06/14/2006   Leroy Sea, MS/CCC- SLP  Lou Miner 05/31/2017, 1:50 PM  [place] MAIN [facility] SERVICES [address] [place], [place], [address] Phone: [phone]   Fax:  [phone]   Name: [name] MRN: [id number] Date of Birth: May 08, 1933

## 2017-06-02 ENCOUNTER — Ambulatory Visit: Payer: Medicare Other | Admitting: Speech Pathology

## 2017-06-07 ENCOUNTER — Ambulatory Visit (INDEPENDENT_AMBULATORY_CARE_PROVIDER_SITE_OTHER): Payer: Medicare Other

## 2017-06-07 ENCOUNTER — Ambulatory Visit: Payer: Medicare Other | Admitting: Speech Pathology

## 2017-06-07 DIAGNOSIS — E538 Deficiency of other specified B group vitamins: Secondary | ICD-10-CM

## 2017-06-07 MED ORDER — CYANOCOBALAMIN 1000 MCG/ML IJ SOLN
1000.0000 ug | Freq: Once | INTRAMUSCULAR | Status: AC
  Administered 2017-06-07: 1000 ug via INTRAMUSCULAR

## 2017-06-09 ENCOUNTER — Ambulatory Visit: Payer: Medicare Other | Admitting: Speech Pathology

## 2017-06-14 ENCOUNTER — Ambulatory Visit: Payer: Medicare Other | Admitting: Speech Pathology

## 2017-06-20 ENCOUNTER — Ambulatory Visit: Payer: Medicare Other | Admitting: Speech Pathology

## 2017-06-21 DIAGNOSIS — R490 Dysphonia: Secondary | ICD-10-CM | POA: Diagnosis not present

## 2017-06-21 DIAGNOSIS — H903 Sensorineural hearing loss, bilateral: Secondary | ICD-10-CM | POA: Diagnosis not present

## 2017-06-22 ENCOUNTER — Ambulatory Visit: Payer: Medicare Other | Admitting: Speech Pathology

## 2017-06-27 ENCOUNTER — Ambulatory Visit: Payer: Medicare Other | Admitting: Speech Pathology

## 2017-06-29 ENCOUNTER — Ambulatory Visit: Payer: Medicare Other | Admitting: Speech Pathology

## 2017-06-30 ENCOUNTER — Other Ambulatory Visit: Payer: Self-pay | Admitting: Primary Care

## 2017-06-30 DIAGNOSIS — I10 Essential (primary) hypertension: Secondary | ICD-10-CM

## 2017-06-30 DIAGNOSIS — D510 Vitamin B12 deficiency anemia due to intrinsic factor deficiency: Secondary | ICD-10-CM

## 2017-06-30 DIAGNOSIS — E785 Hyperlipidemia, unspecified: Secondary | ICD-10-CM

## 2017-07-01 ENCOUNTER — Ambulatory Visit (INDEPENDENT_AMBULATORY_CARE_PROVIDER_SITE_OTHER): Payer: Medicare Other

## 2017-07-01 ENCOUNTER — Ambulatory Visit: Payer: Medicare Other

## 2017-07-01 VITALS — BP 132/78 | HR 68 | Temp 97.8°F | Ht 60.0 in | Wt 129.2 lb

## 2017-07-01 DIAGNOSIS — D510 Vitamin B12 deficiency anemia due to intrinsic factor deficiency: Secondary | ICD-10-CM | POA: Diagnosis not present

## 2017-07-01 DIAGNOSIS — E785 Hyperlipidemia, unspecified: Secondary | ICD-10-CM | POA: Diagnosis not present

## 2017-07-01 DIAGNOSIS — N401 Enlarged prostate with lower urinary tract symptoms: Secondary | ICD-10-CM | POA: Diagnosis not present

## 2017-07-01 DIAGNOSIS — Z0000 Encounter for general adult medical examination without abnormal findings: Secondary | ICD-10-CM

## 2017-07-01 DIAGNOSIS — I10 Essential (primary) hypertension: Secondary | ICD-10-CM | POA: Diagnosis not present

## 2017-07-01 LAB — COMPREHENSIVE METABOLIC PANEL
ALT: 10 U/L (ref 0–53)
AST: 11 U/L (ref 0–37)
Albumin: 4.1 g/dL (ref 3.5–5.2)
Alkaline Phosphatase: 73 U/L (ref 39–117)
BILIRUBIN TOTAL: 0.8 mg/dL (ref 0.2–1.2)
BUN: 9 mg/dL (ref 6–23)
CO2: 30 meq/L (ref 19–32)
Calcium: 9.2 mg/dL (ref 8.4–10.5)
Chloride: 107 mEq/L (ref 96–112)
Creatinine, Ser: 0.83 mg/dL (ref 0.40–1.50)
GFR: 94.33 mL/min (ref >60.00)
Glucose, Bld: 95 mg/dL (ref 70–99)
Potassium: 4.4 mEq/L (ref 3.5–5.1)
Sodium: 143 mEq/L (ref 135–145)
Total Protein: 6 g/dL (ref 6.0–8.3)

## 2017-07-01 LAB — LIPID PANEL
CHOL/HDL RATIO: 3
Cholesterol: 145 mg/dL (ref 0–200)
HDL: 47.9 mg/dL (ref >39.00)
LDL Cholesterol: 89 mg/dL (ref 0–99)
NonHDL: 97.28
TRIGLYCERIDES: 43 mg/dL (ref 0.0–149.0)
VLDL: 8.6 mg/dL (ref 0.0–40.0)

## 2017-07-01 LAB — HEMOGLOBIN A1C: HEMOGLOBIN A1C: 5.2 % (ref 4.6–6.5)

## 2017-07-01 LAB — VITAMIN B12: Vitamin B-12: 558 pg/mL (ref 211–911)

## 2017-07-01 NOTE — Progress Notes (Signed)
PCP notes:   Health maintenance:  No gaps identified.  Abnormal screenings:   Fall risk - hx of multiple falls Fall Risk  07/01/2017 06/29/2016 11/11/2015 03/12/2015 07/01/2014  Falls in the past year? Yes Yes No Yes Yes  Comment - pt fell in yard; pt fell while climbing steps - - -  Number falls in past yr: 2 or more 2 or more - 1 2 or more  Comment - - - - -  Injury with Fall? No No - Yes No  Comment - - - contusion, scrape -  Risk Factor Category  High Fall Risk - - - High Fall Risk  Risk for fall due to : Impaired balance/gait;Impaired mobility - - - Impaired balance/gait  Follow up - - - - Falls prevention discussed   Depression score: 22 Depression screen [facility] 2/9 07/01/2017 06/29/2016 11/11/2015 03/12/2015 03/12/2015  Decreased Interest 3 0 0 1 0  Down, Depressed, Hopeless 3 0 0 1 0  PHQ - 2 Score 6 0 0 2 0  Altered sleeping 3 - - - -  Tired, decreased energy 3 - - - -  Change in appetite 3 - - - -  Feeling bad or failure about yourself  3 - - - -  Trouble concentrating 1 - - - -  Moving slowly or fidgety/restless 2 - - - -  Suicidal thoughts 1 - - - -  PHQ-9 Score 22 - - - -  Difficult doing work/chores Not difficult at all - - - -  Some recent data might be hidden   Patient concerns:   Patient verbalized financial concerns and grief about his  deceased wife are impacting his emotional health.   Nurse concerns:  None  Next PCP appt:   07/07/17 @ 0930

## 2017-07-01 NOTE — Patient Instructions (Signed)
[name] , Thank you for taking time to come for your Medicare Wellness Visit. I appreciate your ongoing commitment to your health goals. Please review the following plan we discussed and let me know if I can assist you in the future.   These are the goals we discussed: Goals    . Patient Stated     Starting 07/01/2017, I will continue taking medications as prescribed.         This is a list of the screening recommended for you and due dates:  Health Maintenance  Topic Date Due  . Flu Shot  08/18/2017  . Tetanus Vaccine  05/04/2021  . Pneumonia vaccines  Completed   Preventive Care for Adults  A healthy lifestyle and preventive care can promote health and wellness. Preventive health guidelines for adults include the following key practices.  . A routine yearly physical is a good way to check with your health care provider about your health and preventive screening. It is a chance to share any concerns and updates on your health and to receive a thorough exam.  . Visit your dentist for a routine exam and preventive care every 6 months. Brush your teeth twice a day and floss once a day. Good oral hygiene prevents tooth decay and gum disease.  . The frequency of eye exams is based on your age, health, family medical history, use  of contact lenses, and other factors. Follow your health care provider's recommendations for frequency of eye exams.  . Eat a healthy diet. Foods like vegetables, fruits, whole grains, low-fat dairy products, and lean protein foods contain the nutrients you need without too many calories. Decrease your intake of foods high in solid fats, added sugars, and salt. Eat the right amount of calories for you. Get information about a proper diet from your health care provider, if necessary.  . Regular physical exercise is one of the most important things you can do for your health. Most adults should get at least 150 minutes of moderate-intensity exercise (any  activity that increases your heart rate and causes you to sweat) each week. In addition, most adults need muscle-strengthening exercises on 2 or more days a week.  Silver Sneakers may be a benefit available to you. To determine eligibility, you may visit the website: www.silversneakers.com or contact program at 215-703-2830 Mon-Fri between 8AM-8PM.   . Maintain a healthy weight. The body mass index (BMI) is a screening tool to identify possible weight problems. It provides an estimate of body fat based on height and weight. Your health care provider can find your BMI and can help you achieve or maintain a healthy weight.   For adults 20 years and older: ? A BMI below 18.5 is considered underweight. ? A BMI of 18.5 to 24.9 is normal. ? A BMI of 25 to 29.9 is considered overweight. ? A BMI of 30 and above is considered obese.   . Maintain normal blood lipids and cholesterol levels by exercising and minimizing your intake of saturated fat. Eat a balanced diet with plenty of fruit and vegetables. Blood tests for lipids and cholesterol should begin at age 65 and be repeated every 5 years. If your lipid or cholesterol levels are high, you are over 50, or you are at high risk for heart disease, you may need your cholesterol levels checked more frequently. Ongoing high lipid and cholesterol levels should be treated with medicines if diet and exercise are not working.  . If you smoke, find  out from your health care provider how to quit. If you do not use tobacco, please do not start.  . If you choose to drink alcohol, please do not consume more than 2 drinks per day. One drink is considered to be 12 ounces (355 mL) of beer, 5 ounces (148 mL) of wine, or 1.5 ounces (44 mL) of liquor.  . If you are 35-7 years old, ask your health care provider if you should take aspirin to prevent strokes.  . Use sunscreen. Apply sunscreen liberally and repeatedly throughout the day. You should seek shade when your  shadow is shorter than you. Protect yourself by wearing long sleeves, pants, a wide-brimmed hat, and sunglasses year round, whenever you are outdoors.  . Once a month, do a whole body skin exam, using a mirror to look at the skin on your back. Tell your health care provider of new moles, moles that have irregular borders, moles that are larger than a pencil eraser, or moles that have changed in shape or color.

## 2017-07-01 NOTE — Progress Notes (Signed)
Subjective:   [name] is a 84 y.o. male who presents for Medicare Annual (Subsequent) preventive examination.  Review of Systems:  N/A Cardiac Risk Factors include: advanced age (>58mn, >>66women);male gender     Objective:     Vitals: BP 132/78 (BP Location: Right Arm, Patient Position: Sitting, Cuff Size: Normal)   Pulse 68   Temp 97.8 F (36.6 C) (Oral)   Ht 5' (1.524 m) Comment: no shoes  Wt 129 lb 4 oz (58.6 kg)   SpO2 95%   BMI 25.24 kg/m   Body mass index is 25.24 kg/m.  Advanced Directives 07/01/2017 05/27/2017 03/22/2017 03/21/2017 06/29/2016 12/22/2015 12/16/2015  Does Patient Have a Medical Advance Directive? Yes Yes No No Yes Yes No  Type of AParamedicof [place]Living will [place]Living will;Out of facility DNR (pink MOST or yellow form) - - HPress photographerLiving will Living will;Healthcare Power of Attorney -  Does patient want to make changes to medical advance directive? - No - Patient declined - - - No - Patient declined -  Copy of HLashmeetin Chart? No - copy requested No - copy requested - - Yes No - copy requested -  Would patient like information on creating a medical advance directive? - - No - Patient declined No - Patient declined - No - Patient declined No - Patient declined  Pre-existing out of facility DNR order (yellow form or pink MOST form) - (No Data) - - - - -    Tobacco Social History   Tobacco Use  Smoking Status Former Smoker  . Types: Pipe  . Last attempt to quit: 01/18/1966  . Years since quitting: 51.4  Smokeless Tobacco Never Used     Counseling given: No   Clinical Intake:  Pre-visit preparation completed: Yes  Pain : 0-10 Pain Score: 3  Pain Type: Chronic pain Pain Location: Generalized     Nutritional Status: BMI 25 -29 Overweight Nutritional Risks: None Diabetes: No  How often do you need to have someone help you when you read  instructions, pamphlets, or other written materials from your doctor or pharmacy?: 1 - Never What is the last grade level you completed in school?: 12th grade  Interpreter Needed?: No  Comments: pt is a widower and lives alone Information entered by :: LPinson, LPN  Past Medical History:  Diagnosis Date  . Arthralgia of multiple joints    limited mobility w/  independant adl's  . BPH with obstruction/lower urinary tract symptoms   . Chronic idiopathic monocytosis    followed by dr gWaymon Budge-  per lov note 06/ 2017 persistant unexplained  with normal bone barrow bx  . Coronary atherosclerosis of unspecified type of vessel, native or graft    cardiologist-  dr cStanford Breed-- per lov note 11-19-2014 ,  cardiac cath in 2000-- pLAD 60-70%,  small intermediate branch 70-80%,  mRCA 20%,  normal LV  . Degenerative arthritis of spine    cervical and lumar  . Diverticulosis of colon (without mention of hemorrhage)   . Dysuria    chronic  . Elevated PSA   . Feeling of incomplete bladder emptying   . Hiatal hernia    moderate per ct 11/ 2017  . History of adenomatous polyp of colon    2008  . History of atrial fibrillation    remote hx episode  . History of pancreatitis    07-01-2013  and 10-10-2015  .  History of prostatitis    2014  . Hx of colonic polyps   . Hyperlipidemia   . Hypertension   . Mouth sore    roof of mouth sore   . Nephrolithiasis   . Nocturia more than twice per night    severe  w/ leakage  . OSA (obstructive sleep apnea)    INTOLERANT CPAP  . Osteoarthritis   . Pernicious anemia    B12  Def.  . Peyronie disease   . Renal insufficiency   . Thrombocytopenia, unspecified ([place]) hemotology/oncologist-  dr Waymon Budge   per dr Velta Addison note 06/ 2016  secondary to vitro clumping   Past Surgical History:  Procedure Laterality Date  . BONE MARROW BIOPSY  2011   normal  . CARDIAC CATHETERIZATION  2000   per dr Kathyrn Drown note --  dLAD 60-70%,  small  intermediate branch 70-80%,  mRCA 20%,  normal LV  . CARDIOVASCULAR STRESS TEST  01-29-2013   dr Stanford Breed   normal nuclear study w/ no ischemia,  normal LV function and wall motion , ef 82%  . CATARACT EXTRACTION W/ INTRAOCULAR LENS  IMPLANT, BILATERAL  08/2015  . CHOLECYSTECTOMY  11/24/2010   Procedure: LAPAROSCOPIC CHOLECYSTECTOMY WITH INTRAOPERATIVE CHOLANGIOGRAM;  Surgeon: Earnstine Regal, MD;  Location: [facility];  Service: General;  Laterality: N/A;  c-arm  . CYSTOSCOPY W/ URETERAL STENT PLACEMENT Left 12/17/2015   Procedure: CYSTOSCOPY WITH RETROGRADE PYELOGRAM/URETERAL STENT PLACEMENT;  Surgeon: Ardis Hughs, MD;  Location: [facility];  Service: Urology;  Laterality: Left;  . CYSTOSCOPY WITH RETROGRADE PYELOGRAM, URETEROSCOPY AND STENT PLACEMENT Left 12/22/2015   Procedure: CYSTOSCOPY WITH LEFT RETROGRADE  URETEROSCOPY AND STENT PLACEMENT;  Surgeon: Carolan Clines, MD;  Location: [facility];  Service: Urology;  Laterality: Left;  . ERCP N/A 07/04/2013   Procedure: ENDOSCOPIC RETROGRADE CHOLANGIOPANCREATOGRAPHY (ERCP);  Surgeon: Gatha Mayer, MD;  Location: [facility];  Service: Endoscopy;  Laterality: N/A;  MAC if available  . ERCP N/A 10/12/2015   Procedure: ENDOSCOPIC RETROGRADE CHOLANGIOPANCREATOGRAPHY (ERCP);  Surgeon: [name], MD;  Location: [facility];  Service: Endoscopy;  Laterality: N/A;  . HOLMIUM LASER APPLICATION Left 54/0/0867   Procedure: HOLMIUM LASER APPLICATION;  Surgeon: Carolan Clines, MD;  Location: [facility];  Service: Urology;  Laterality: Left;  . INGUINAL HERNIA REPAIR Right 01/25/2002  . KNEE ARTHROSCOPY  x5  . SATURATION BIOPSY OF PROSTATE  05-29-2007  and 01-26-2008  . SHOULDER ARTHROSCOPY WITH OPEN ROTATOR CUFF REPAIR AND DISTAL CLAVICLE ACROMINECTOMY Right 11/19/2004  . TOTAL HIP ARTHROPLASTY Right 05/21/2009  . TOTAL KNEE ARTHROPLASTY Bilateral left 05-02-2003/  right  03-23-2010  . TRANSTHORACIC ECHOCARDIOGRAM   12/23/2004   ef 60%, mild MV calcification without stenosis/  mild TR,  PASP 48mHg  . UVULOPALATOPHARYNGOPLASTY  1993    w/  T & A   Family History  Problem Relation Age of Onset  . Colon cancer Father 440 . Peripheral vascular disease Mother    Social History   Socioeconomic History  . Marital status: Widowed    Spouse name: Not on file  . Number of children: 2  . Years of education: Not on file  . Highest education level: Not on file  Occupational History  . 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 Financial resource strain: Not on file  . Food insecurity:    Worry: Not on file    Inability: Not on file  . Transportation needs:  Medical: Not on file    Non-medical: Not on file  Tobacco Use  . Smoking status: Former Smoker    Types: Pipe    Last attempt to quit: 01/18/1966    Years since quitting: 51.4  . Smokeless tobacco: Never Used  Substance and Sexual Activity  . Alcohol use: No    Alcohol/week: 0.0 oz  . Drug use: No  . Sexual activity: Never  Lifestyle  . Physical activity:    Days per week: Not on file    Minutes per session: Not on file  . Stress: Not on file  Relationships  . Social connections:    Talks on phone: Not on file    Gets together: Not on file    Attends religious service: Not on file    Active member of club or organization: Not on file    Attends meetings of clubs or organizations: Not on file    Relationship status: Not on file  Other Topics Concern  . Not on file  Social History Narrative   DNR   Widowed   2 daughters leave near by    Outpatient Encounter Medications as of 07/01/2017  Medication Sig  . albuterol (PROVENTIL HFA;VENTOLIN HFA) 108 (90 Base) MCG/ACT inhaler Inhale 2 puffs into the lungs every 6 (six) hours as needed.  . Artificial Tear Solution (TEARS NATURALE OP) Place 1-2 drops into both eyes at bedtime as needed (dry eyes).  . fluticasone (FLONASE) 50 MCG/ACT nasal spray Place 1 spray into  both nostrils as needed for allergies or rhinitis.  . Loratadine (CLARITIN PO) Take 1 tablet by mouth as needed.  . metoprolol succinate (TOPROL-XL) 25 MG 24 hr tablet Take 1 tablet (25 mg total) by mouth daily. (Patient taking differently: Take 25 mg by mouth every other day. )  . Multiple Vitamin (MULTIVITAMIN) tablet Take 1 tablet by mouth daily.  . [DISCONTINUED] aspirin EC 81 MG tablet Take 1 tablet (81 mg total) by mouth daily. (Patient taking differently: Take 81 mg by mouth every 4 (four) hours as needed for mild pain. )   No facility-administered encounter medications on file as of 07/01/2017.     Activities of Daily Living In your present state of health, do you have any difficulty performing the following activities: 07/01/2017 03/22/2017  Hearing? Y N  Vision? N N  Difficulty concentrating or making decisions? Y N  Walking or climbing stairs? N Y  Dressing or bathing? N Y  Doing errands, shopping? N Y  Conservation officer, nature and eating ? N -  Using the Toilet? N -  In the past six months, have you accidently leaked urine? N -  Do you have problems with loss of bowel control? N -  Managing your Medications? N -  Managing your Finances? N -  Housekeeping or managing your Housekeeping? N -  Some recent data might be hidden    Patient Care Team: Pleas Koch, NP as PCP - General (Internal Medicine) Annia Belt, MD as Consulting Physician (Oncology) Carolan Clines, MD as Consulting Physician (Urology) Lafayette Dragon, MD (Inactive) as Consulting Physician (Gastroenterology) Lelon Perla, MD as Consulting Physician (Cardiology)    Assessment:   This is a routine wellness examination for [place].   Visual Acuity Screening   Right eye Left eye Both eyes  Without correction:     With correction: 20/30 20/40-1 20/30  Hearing Screening Comments: Bilateral hearing aids    Exercise Activities and Dietary recommendations Current Exercise Habits:  The patient  does not participate in regular exercise at present, Exercise limited by: None identified  Goals    . Patient Stated     Starting 07/01/2017, I will continue taking medications as prescribed.         Fall Risk Fall Risk  07/01/2017 06/29/2016 11/11/2015 03/12/2015 07/01/2014  Falls in the past year? Yes Yes No Yes Yes  Comment - pt fell in yard; pt fell while climbing steps - - -  Number falls in past yr: 2 or more 2 or more - 1 2 or more  Comment - - - - -  Injury with Fall? No No - Yes No  Comment - - - contusion, scrape -  Risk Factor Category  High Fall Risk - - - High Fall Risk  Risk for fall due to : Impaired balance/gait;Impaired mobility - - - Impaired balance/gait  Follow up - - - - Falls prevention discussed   Depression Screen PHQ 2/9 Scores 07/01/2017 06/29/2016 11/11/2015 03/12/2015  PHQ - 2 Score 6 0 0 2  PHQ- 9 Score 22 - - -     Cognitive Function MMSE - Mini Mental State Exam 07/01/2017 06/29/2016  Orientation to time 5 5  Orientation to Place 5 5  Registration 3 3  Attention/ Calculation 0 0  Recall 3 3  Language- name 2 objects 0 0  Language- repeat 1 1  Language- follow 3 step command 3 3  Language- read & follow direction 0 0  Write a sentence 0 0  Copy design 0 0  Total score 20 20     PLEASE NOTE: A Mini-Cog screen was completed. Maximum score is 20. A value of 0 denotes this part of Folstein MMSE was not completed or the patient failed this part of the Mini-Cog screening.   Mini-Cog Screening Orientation to Time - Max 5 pts Orientation to Place - Max 5 pts Registration - Max 3 pts Recall - Max 3 pts Language Repeat - Max 1 pts Language Follow 3 Step Command - Max 3 pts     Immunization History  Administered Date(s) Administered  . Influenza Split 11/12/2010, 10/26/2011  . Influenza Whole 11/14/2007, 10/24/2008, 10/16/2009  . Influenza,inj,Quad PF,6+ Mos 09/21/2012, 09/06/2013, 10/24/2014, 12/30/2015, 11/24/2016  . Pneumococcal Conjugate-13  09/06/2013  . Pneumococcal Polysaccharide-23 01/11/2007  . Td 05/05/2011  . Zoster 03/13/2007    Screening Tests Health Maintenance  Topic Date Due  . INFLUENZA VACCINE  08/18/2017  . TETANUS/TDAP  05/04/2021  . PNA vac Low Risk Adult  Completed      Plan:     I have personally reviewed, addressed, and noted the following in the patient's chart:  A. Medical and social history B. Use of alcohol, tobacco or illicit drugs  C. Current medications and supplements D. Functional ability and status E.  Nutritional status F.  Physical activity G. Advance directives H. List of other physicians I.  Hospitalizations, surgeries, and ER visits in previous 12 months J.  [place] to include hearing, vision, cognitive, depression L. Referrals and appointments - none  In addition, I have reviewed and discussed with patient certain preventive protocols, quality metrics, and best practice recommendations. A written personalized care plan for preventive services as well as general preventive health recommendations were provided to patient.  See attached scanned questionnaire for additional information.   Signed,   Lindell Noe, MHA, BS, LPN Health Coach

## 2017-07-01 NOTE — Progress Notes (Signed)
I reviewed health advisor's note, was available for consultation, and agree with documentation and plan.  

## 2017-07-07 ENCOUNTER — Encounter: Payer: Self-pay | Admitting: Primary Care

## 2017-07-07 ENCOUNTER — Encounter: Payer: Medicare Other | Admitting: Family Medicine

## 2017-07-07 ENCOUNTER — Ambulatory Visit (INDEPENDENT_AMBULATORY_CARE_PROVIDER_SITE_OTHER): Payer: Medicare Other | Admitting: Primary Care

## 2017-07-07 VITALS — BP 120/60 | HR 80 | Temp 97.8°F | Ht 60.0 in | Wt 129.5 lb

## 2017-07-07 DIAGNOSIS — J309 Allergic rhinitis, unspecified: Secondary | ICD-10-CM | POA: Diagnosis not present

## 2017-07-07 DIAGNOSIS — Z0000 Encounter for general adult medical examination without abnormal findings: Secondary | ICD-10-CM

## 2017-07-07 DIAGNOSIS — F329 Major depressive disorder, single episode, unspecified: Secondary | ICD-10-CM

## 2017-07-07 DIAGNOSIS — I10 Essential (primary) hypertension: Secondary | ICD-10-CM | POA: Diagnosis not present

## 2017-07-07 DIAGNOSIS — N401 Enlarged prostate with lower urinary tract symptoms: Secondary | ICD-10-CM | POA: Diagnosis not present

## 2017-07-07 DIAGNOSIS — E785 Hyperlipidemia, unspecified: Secondary | ICD-10-CM

## 2017-07-07 DIAGNOSIS — F32A Depression, unspecified: Secondary | ICD-10-CM

## 2017-07-07 DIAGNOSIS — R1310 Dysphagia, unspecified: Secondary | ICD-10-CM

## 2017-07-07 DIAGNOSIS — D510 Vitamin B12 deficiency anemia due to intrinsic factor deficiency: Secondary | ICD-10-CM | POA: Diagnosis not present

## 2017-07-07 DIAGNOSIS — G4730 Sleep apnea, unspecified: Secondary | ICD-10-CM | POA: Diagnosis not present

## 2017-07-07 DIAGNOSIS — D696 Thrombocytopenia, unspecified: Secondary | ICD-10-CM

## 2017-07-07 DIAGNOSIS — R351 Nocturia: Secondary | ICD-10-CM

## 2017-07-07 DIAGNOSIS — I2510 Atherosclerotic heart disease of native coronary artery without angina pectoris: Secondary | ICD-10-CM

## 2017-07-07 NOTE — Assessment & Plan Note (Signed)
Chronic and stable, continue to monitor. Vitamin B 12 level stable on recent labs. Continue injections.

## 2017-07-07 NOTE — Assessment & Plan Note (Signed)
Recent lipid panel unremarkable. Discussed to increase vegetables, fruit, whole grains, water. Recommended regular exercise.

## 2017-07-07 NOTE — Assessment & Plan Note (Signed)
BP stable in the office today, continue Toprol 25 mg. BMP unremarkable.

## 2017-07-07 NOTE — Assessment & Plan Note (Signed)
Immunizations UTD. Recommended to increase activity levels, increase vegetables/fruit/whole grains.  Exam unremarkable. Labs unremarkable. Follow up in 1 year for CPE.

## 2017-07-07 NOTE — Assessment & Plan Note (Signed)
Not using CPAP machine due to inability to tolerate.

## 2017-07-07 NOTE — Assessment & Plan Note (Signed)
Recent B12 stable. Continue to monitor CBC and B12.

## 2017-07-07 NOTE — Assessment & Plan Note (Signed)
Asymptomatic. Continue BP an lipid control.

## 2017-07-07 NOTE — Assessment & Plan Note (Signed)
Using albuterol an Claritin PRN during seasonal changes.

## 2017-07-07 NOTE — Progress Notes (Signed)
Subjective:    Patient ID: [name], male    DOB: 12/10/1933, 84 y.o.   MRN: [id number]  HPI  [name] is an 84 year old male who presents today for complete physical. He saw our health advisor last week.  Immunizations: -Tetanus: Completed in 2013 -Influenza: Completed last season  -Pneumonia: Completed Prevnar in 2015, Pneumovax in 2008 -Shingles: Completed in 2009  Diet: He endorses a poor diet. Breakfast: Sausage, eggs, toast Lunch: Sandwich, restaurants, fast food, sometimes skips if has late breakfast Dinner: Patent examiner, Armed forces training and education officer, frozen dinners Snacks: Cakes Desserts: Cakes, Daily Beverages: Coffee, green tea, peach tea, diet drinks  Exercise: He is active 1-2 days weekly, not exercising Eye exam: Completed 2 years ago Dental exam: No recent exam Colonoscopy: Completed in 2011  BP Readings from Last 3 Encounters:  07/07/17 120/60  07/01/17 132/78  03/31/17 122/74     Review of Systems  Constitutional: Negative for unexpected weight change.  HENT: Negative for rhinorrhea.   Respiratory: Negative for cough and shortness of breath.   Cardiovascular: Negative for chest pain.  Gastrointestinal: Negative for constipation and diarrhea.  Genitourinary: Negative for difficulty urinating.  Musculoskeletal: Negative for arthralgias and myalgias.  Skin: Negative for rash.  Allergic/Immunologic: Negative for environmental allergies.  Neurological: Negative for dizziness, numbness and headaches.  Psychiatric/Behavioral:       Some feelings of loneliness, overall working on more socialization.         Past Medical History:  Diagnosis Date  . Arthralgia of multiple joints    limited mobility w/  independant adl's  . BPH with obstruction/lower urinary tract symptoms   . Chronic idiopathic monocytosis    followed by dr Waymon Budge--  per lov note 06/ 2017 persistant unexplained  with normal bone barrow bx  . Coronary atherosclerosis of unspecified type of  vessel, native or graft    cardiologist-  dr Stanford Breed -- per lov note 11-19-2014 ,  cardiac cath in 2000-- pLAD 60-70%,  small intermediate branch 70-80%,  mRCA 20%,  normal LV  . Degenerative arthritis of spine    cervical and lumar  . Diverticulosis of colon (without mention of hemorrhage)   . Dysuria    chronic  . Elevated PSA   . Feeling of incomplete bladder emptying   . Hiatal hernia    moderate per ct 11/ 2017  . History of adenomatous polyp of colon    2008  . History of atrial fibrillation    remote hx episode  . History of pancreatitis    07-01-2013  and 10-10-2015  . History of prostatitis    2014  . Hx of colonic polyps   . Hyperlipidemia   . Hypertension   . Mouth sore    roof of mouth sore   . Nephrolithiasis   . Nocturia more than twice per night    severe  w/ leakage  . OSA (obstructive sleep apnea)    INTOLERANT CPAP  . Osteoarthritis   . Pernicious anemia    B12  Def.  . Peyronie disease   . Renal insufficiency   . Thrombocytopenia, unspecified ([place]) hemotology/oncologist-  dr Waymon Budge   per dr Velta Addison note 06/ 2016  secondary to vitro clumping     Social History   Socioeconomic History  . Marital status: Widowed    Spouse name: Not on file  . Number of children: 2  . Years of education: Not on file  . Highest education level: Not on file  Occupational  History  . Occupation: retired    Fish farm manager: RETIRED  Social Needs  . Financial resource strain: Not on file  . Food insecurity:    Worry: Not on file    Inability: Not on file  . Transportation needs:    Medical: Not on file    Non-medical: Not on file  Tobacco Use  . Smoking status: Former Smoker    Types: Pipe    Last attempt to quit: 01/18/1966    Years since quitting: 51.5  . Smokeless tobacco: Never Used  Substance and Sexual Activity  . Alcohol use: No    Alcohol/week: 0.0 oz  . Drug use: No  . Sexual activity: Never  Lifestyle  . Physical activity:    Days per  week: Not on file    Minutes per session: Not on file  . Stress: Not on file  Relationships  . Social connections:    Talks on phone: Not on file    Gets together: Not on file    Attends religious service: Not on file    Active member of club or organization: Not on file    Attends meetings of clubs or organizations: Not on file    Relationship status: Not on file  . Intimate partner violence:    Fear of current or ex partner: Not on file    Emotionally abused: Not on file    Physically abused: Not on file    Forced sexual activity: Not on file  Other Topics Concern  . Not on file  Social History Narrative   DNR   Widowed   2 daughters leave near by    Past Surgical History:  Procedure Laterality Date  . BONE MARROW BIOPSY  2011   normal  . CARDIAC CATHETERIZATION  2000   per dr Kathyrn Drown note --  dLAD 60-70%,  small intermediate branch 70-80%,  mRCA 20%,  normal LV  . CARDIOVASCULAR STRESS TEST  01-29-2013   dr Stanford Breed   normal nuclear study w/ no ischemia,  normal LV function and wall motion , ef 82%  . CATARACT EXTRACTION W/ INTRAOCULAR LENS  IMPLANT, BILATERAL  08/2015  . CHOLECYSTECTOMY  11/24/2010   Procedure: LAPAROSCOPIC CHOLECYSTECTOMY WITH INTRAOPERATIVE CHOLANGIOGRAM;  Surgeon: Earnstine Regal, MD;  Location: [facility];  Service: General;  Laterality: N/A;  c-arm  . CYSTOSCOPY W/ URETERAL STENT PLACEMENT Left 12/17/2015   Procedure: CYSTOSCOPY WITH RETROGRADE PYELOGRAM/URETERAL STENT PLACEMENT;  Surgeon: Ardis Hughs, MD;  Location: [facility];  Service: Urology;  Laterality: Left;  . CYSTOSCOPY WITH RETROGRADE PYELOGRAM, URETEROSCOPY AND STENT PLACEMENT Left 12/22/2015   Procedure: CYSTOSCOPY WITH LEFT RETROGRADE  URETEROSCOPY AND STENT PLACEMENT;  Surgeon: Carolan Clines, MD;  Location: [facility];  Service: Urology;  Laterality: Left;  . ERCP N/A 07/04/2013   Procedure: ENDOSCOPIC RETROGRADE CHOLANGIOPANCREATOGRAPHY (ERCP);  Surgeon: Gatha Mayer, MD;  Location: [facility];  Service: Endoscopy;  Laterality: N/A;  MAC if available  . ERCP N/A 10/12/2015   Procedure: ENDOSCOPIC RETROGRADE CHOLANGIOPANCREATOGRAPHY (ERCP);  Surgeon: [name], MD;  Location: [facility];  Service: Endoscopy;  Laterality: N/A;  . HOLMIUM LASER APPLICATION Left 16/01/958   Procedure: HOLMIUM LASER APPLICATION;  Surgeon: Carolan Clines, MD;  Location: [facility];  Service: Urology;  Laterality: Left;  . INGUINAL HERNIA REPAIR Right 01/25/2002  . KNEE ARTHROSCOPY  x5  . SATURATION BIOPSY OF PROSTATE  05-29-2007  and 01-26-2008  . SHOULDER ARTHROSCOPY WITH OPEN ROTATOR CUFF REPAIR  AND DISTAL CLAVICLE ACROMINECTOMY Right 11/19/2004  . TOTAL HIP ARTHROPLASTY Right 05/21/2009  . TOTAL KNEE ARTHROPLASTY Bilateral left 05-02-2003/  right  03-23-2010  . TRANSTHORACIC ECHOCARDIOGRAM  12/23/2004   ef 60%, mild MV calcification without stenosis/  mild TR,  PASP 64mHg  . UVULOPALATOPHARYNGOPLASTY  1993    w/  T & A    Family History  Problem Relation Age of Onset  . Colon cancer Father 42 . Peripheral vascular disease Mother     Allergies  Allergen Reactions  . Penicillins Hives and Other (See Comments)    [place], passed out Tolerates cephalosporins  Has patient had a PCN reaction causing immediate rash, facial/tongue/throat swelling, SOB or lightheadedness with hypotension:  yes Has patient had a PCN reaction causing severe rash involving mucus membranes or skin necrosis: no Has patient had a PCN reaction that required hospitalization: no Has patient had a PCN reaction occurring within the last 10 years: no If all of the above answers are "NO", then may proceed with Cephalosporin use.     Current Outpatient Medications on File Prior to Visit  Medication Sig Dispense Refill  . tamsulosin (FLOMAX) 0.4 MG CAPS capsule Take 0.4 mg by mouth.    .Marland Kitchenalbuterol (PROVENTIL HFA;VENTOLIN HFA) 108 (90 Base) MCG/ACT inhaler Inhale 2  puffs into the lungs every 6 (six) hours as needed. 1 Inhaler 0  . Artificial Tear Solution (TEARS NATURALE OP) Place 1-2 drops into both eyes at bedtime as needed (dry eyes).    . fluticasone (FLONASE) 50 MCG/ACT nasal spray Place 1 spray into both nostrils as needed for allergies or rhinitis.    . Loratadine (CLARITIN PO) Take 1 tablet by mouth as needed.    . metoprolol succinate (TOPROL-XL) 25 MG 24 hr tablet Take 1 tablet (25 mg total) by mouth daily. (Patient taking differently: Take 25 mg by mouth every other day. ) 90 tablet 3  . Multiple Vitamin (MULTIVITAMIN) tablet Take 1 tablet by mouth daily.     No current facility-administered medications on file prior to visit.     BP 120/60   Pulse 80   Temp 97.8 F (36.6 C) (Oral)   Ht 5' (1.524 m)   Wt 129 lb 8 oz (58.7 kg)   SpO2 97%   BMI 25.29 kg/m    Objective:   Physical Exam  Constitutional: He is oriented to person, place, and time. He appears well-nourished.  HENT:  Mouth/Throat: No oropharyngeal exudate.  Eyes: Pupils are equal, round, and reactive to light. EOM are normal.  Neck: Neck supple. No thyromegaly present.  Cardiovascular: Normal rate and regular rhythm.  Respiratory: Effort normal and breath sounds normal.  GI: Soft. Bowel sounds are normal. There is no tenderness.  Musculoskeletal:  Overall normal ROM, ambulates well without assistive device.   Neurological: He is alert and oriented to person, place, and time.  Skin: Skin is warm and dry.  Psychiatric: He has a normal mood and affect.           Assessment & Plan:

## 2017-07-07 NOTE — Assessment & Plan Note (Signed)
Chronic, evaluated by GI several times. Continue to monitor.

## 2017-07-07 NOTE — Assessment & Plan Note (Signed)
Some symptoms of loneliness, overall manages well. Discussed to increase activity level. He plans on becoming more social with friends.

## 2017-07-07 NOTE — Assessment & Plan Note (Signed)
Following with Urology, managed on Tamsulosin.

## 2017-07-07 NOTE — Patient Instructions (Signed)
Increase your activity levels as discussed.  Increase vegetables, fruit, whole grains.  Ensure you are consuming 64 ounces of water daily.  Follow up in 1 year for your annual exam or sooner if needed.  It was a pleasure to see you today!   Preventive Care 13 Years and Older, Male Preventive care refers to lifestyle choices and visits with your health care provider that can promote health and wellness. What does preventive care include?  A yearly physical exam. This is also called an annual well check.  Dental exams once or twice a year.  Routine eye exams. Ask your health care provider how often you should have your eyes checked.  Personal lifestyle choices, including: ? Daily care of your teeth and gums. ? Regular physical activity. ? Eating a healthy diet. ? Avoiding tobacco and drug use. ? Limiting alcohol use. ? Practicing safe sex. ? Taking low doses of aspirin every day. ? Taking vitamin and mineral supplements as recommended by your health care provider. What happens during an annual well check? The services and screenings done by your health care provider during your annual well check will depend on your age, overall health, lifestyle risk factors, and family history of disease. Counseling Your health care provider may ask you questions about your:  Alcohol use.  Tobacco use.  Drug use.  Emotional well-being.  Home and relationship well-being.  Sexual activity.  Eating habits.  History of falls.  Memory and ability to understand (cognition).  Work and work Statistician.  Screening You may have the following tests or measurements:  Height, weight, and BMI.  Blood pressure.  Lipid and cholesterol levels. These may be checked every 5 years, or more frequently if you are over 6 years old.  Skin check.  Lung cancer screening. You may have this screening every year starting at age 78 if you have a 30-pack-year history of smoking and currently smoke  or have quit within the past 15 years.  Fecal occult blood test (FOBT) of the stool. You may have this test every year starting at age 55.  Flexible sigmoidoscopy or colonoscopy. You may have a sigmoidoscopy every 5 years or a colonoscopy every 10 years starting at age 94.  Prostate cancer screening. Recommendations will vary depending on your family history and other risks.  Hepatitis C blood test.  Hepatitis B blood test.  Sexually transmitted disease (STD) testing.  Diabetes screening. This is done by checking your blood sugar (glucose) after you have not eaten for a while (fasting). You may have this done every 1-3 years.  Abdominal aortic aneurysm (AAA) screening. You may need this if you are a current or former smoker.  Osteoporosis. You may be screened starting at age 24 if you are at high risk.  Talk with your health care provider about your test results, treatment options, and if necessary, the need for more tests. Vaccines Your health care provider may recommend certain vaccines, such as:  Influenza vaccine. This is recommended every year.  Tetanus, diphtheria, and acellular pertussis (Tdap, Td) vaccine. You may need a Td booster every 10 years.  Varicella vaccine. You may need this if you have not been vaccinated.  Zoster vaccine. You may need this after age 62.  Measles, mumps, and rubella (MMR) vaccine. You may need at least one dose of MMR if you were born in 1957 or later. You may also need a second dose.  Pneumococcal 13-valent conjugate (PCV13) vaccine. One dose is recommended after age 16.  Pneumococcal polysaccharide (PPSV23) vaccine. One dose is recommended after age 53.  Meningococcal vaccine. You may need this if you have certain conditions.  Hepatitis A vaccine. You may need this if you have certain conditions or if you travel or work in places where you may be exposed to hepatitis A.  Hepatitis B vaccine. You may need this if you have certain  conditions or if you travel or work in places where you may be exposed to hepatitis B.  Haemophilus influenzae type b (Hib) vaccine. You may need this if you have certain risk factors.  Talk to your health care provider about which screenings and vaccines you need and how often you need them. This information is not intended to replace advice given to you by your health care provider. Make sure you discuss any questions you have with your health care provider. Document Released: 01/31/2015 Document Revised: 09/24/2015 Document Reviewed: 11/05/2014 Elsevier Interactive Patient Education  [employer].

## 2017-09-05 ENCOUNTER — Ambulatory Visit: Payer: Self-pay | Admitting: *Deleted

## 2017-09-05 NOTE — Telephone Encounter (Signed)
Patient had an episode of blurred vision then double vision this morning while he was sitting watching television. He had just come in from doing yard work. The double vision was stacked on top of each other. The episode resolved itself and lasted no more than 5 minutes he reported. He felt weak for that brief time. Denies one-sided weakness/change in speech/headache/vertigo. He has had 32 ounces of fluids to drink up to this time.Marland KitchenHe checked  b/[facility]-one day 96/60 p.80, next day 114/60 p.80's. Also reports he now wears hearing aid in right ear. Reviewed symptoms that would require emergency treatment. He will have someone drive him to the appointment for safety purposes. PCP not available this week. Scheduled for tomorrow afternoon.  Reason for Disposition . [1] Brief (now gone) blurred vision AND [2] unexplained  Answer Assessment - Initial Assessment Questions 1. DESCRIPTION: "What is the vision loss like? Describe it for me." (e.g., complete vision loss, blurred vision, double vision, floaters, etc.)     Blurred vision then double vision. 2. LOCATION: "One or both eyes?" If one, ask: "Which eye?"     both 3. SEVERITY: "Can you see anything?" If so, ask: "What can you see?" (e.g., fine print)    Saw tv blurry then saw double vision, one on top of the other.  4. ONSET: "When did this begin?" "Did it start suddenly or has this been gradual?"     This morning after working in the yard. About 11:00am. 5. PATTERN: "Does this come and go, or has it been constant since it started?"     This was one time this morning. 6. PAIN: "Is there any pain in your eye(s)?"  (Scale 1-10; or mild, moderate, severe)     no 7. CONTACTS-GLASSES: "Do you wear contacts or glasses?"     Wears glasses 8. CAUSE: "What do you think is causing this visual problem?"     Does not know. He was told by eye Dr. He has had an optical stroke.  9. OTHER SYMPTOMS: "Do you have any other symptoms?" (e.g.,  confusion, headache, arm or leg weakness, speech problems)     no 10. PREGNANCY: "Is there any chance you are pregnant?" "When was your last menstrual period?"       na  Protocols used: [place]

## 2017-09-06 ENCOUNTER — Ambulatory Visit (INDEPENDENT_AMBULATORY_CARE_PROVIDER_SITE_OTHER): Payer: Medicare Other | Admitting: Family Medicine

## 2017-09-06 ENCOUNTER — Encounter: Payer: Self-pay | Admitting: Family Medicine

## 2017-09-06 VITALS — BP 122/62 | HR 82 | Temp 97.9°F | Resp 18 | Ht 60.0 in | Wt 133.5 lb

## 2017-09-06 DIAGNOSIS — I10 Essential (primary) hypertension: Secondary | ICD-10-CM | POA: Diagnosis not present

## 2017-09-06 DIAGNOSIS — H539 Unspecified visual disturbance: Secondary | ICD-10-CM

## 2017-09-06 DIAGNOSIS — R21 Rash and other nonspecific skin eruption: Secondary | ICD-10-CM

## 2017-09-06 DIAGNOSIS — D510 Vitamin B12 deficiency anemia due to intrinsic factor deficiency: Secondary | ICD-10-CM

## 2017-09-06 DIAGNOSIS — E538 Deficiency of other specified B group vitamins: Secondary | ICD-10-CM | POA: Diagnosis not present

## 2017-09-06 MED ORDER — CYANOCOBALAMIN 1000 MCG/ML IJ SOLN
1000.0000 ug | Freq: Once | INTRAMUSCULAR | Status: AC
  Administered 2017-09-06: 1000 ug via INTRAMUSCULAR

## 2017-09-06 NOTE — Progress Notes (Signed)
BP 122/62 (BP Location: Left Arm, Patient Position: Sitting, Cuff Size: Normal)   Pulse 82   Temp 97.9 F (36.6 C) (Oral)   Resp 18   Ht 5' (1.524 m)   Wt 133 lb 8 oz (60.6 kg)   SpO2 97%   BMI 26.07 kg/m     Visual Acuity Screening   Right eye Left eye Both eyes  Without correction:     With correction: 20/40 20/70 20/40     CC: blurred vision Subjective:    Patient ID: [name], male    DOB: [birth year], 84 y.o.   MRN: [id number]  HPI: TORRIN CRIHFIELD is a 84 y.o. male presenting on 09/06/2017 for Blurred Vision (Had an incident of blurred vision yesterday after doing some yardwork. Lasted about 5 mins. States has had issues with peripheral vision since having cataract surgery and low BP in the past. Pt is accompanined by his daughter. ) and Skin Irritation (Has some irritation on lower LEs. )   Here with daughter today.  Yesterday morning 7am went outside to trim bushes - got tired and hot - came inside and drank glass of sweet tea. At 11am had episode of vision changes - blurred and squiggly "like looking through stained glass" entire visual field, as well as vertical double vision. This lasted 5 minutes. Both eyes were affected.  Notes some sense of movement in peripheral vision.  Ophtho - [facility], last seen 6 months ago.  H/o bilateral cataract surgery 2017.  Some photopsia 1 wk ago. Intermittent floaters - improved after cataract surgery.   Denies fevers/chills, headaches. No dizziness or palpitations.  No vision changes today.  He has not been taking metoprolol XL 25mg .  Also - R posterior calf lesion that is very itchy. Has seen dermatologist for this - treated with steroid cream without much benefit, dry skin cream. Most irritating after shower. Has tried many creams without benefit - has lotrisone at home, hasn't used recently  Relevant past medical, surgical, family and social history reviewed and updated as indicated. Interim medical history since our  last visit reviewed. Allergies and medications reviewed and updated. Outpatient Medications Prior to Visit  Medication Sig Dispense Refill  . albuterol (PROVENTIL HFA;VENTOLIN HFA) 108 (90 Base) MCG/ACT inhaler Inhale 2 puffs into the lungs every 6 (six) hours as needed. 1 Inhaler 0  . Artificial Tear Solution (TEARS NATURALE OP) Place 1-2 drops into both eyes at bedtime as needed (dry eyes).    . fluticasone (FLONASE) 50 MCG/ACT nasal spray Place 1 spray into both nostrils as needed for allergies or rhinitis.    . Loratadine (CLARITIN PO) Take 1 tablet by mouth daily.     . Multiple Vitamin (MULTIVITAMIN) tablet Take 1 tablet by mouth daily.    . tamsulosin (FLOMAX) 0.4 MG CAPS capsule Take 0.4 mg by mouth.    . metoprolol succinate (TOPROL-XL) 25 MG 24 hr tablet Take 1 tablet (25 mg total) by mouth daily. (Patient not taking: Reported on 09/06/2017) 90 tablet 3   No facility-administered medications prior to visit.      Per HPI unless specifically indicated in ROS section below Review of Systems     Objective:    BP 122/62 (BP Location: Left Arm, Patient Position: Sitting, Cuff Size: Normal)   Pulse 82   Temp 97.9 F (36.6 C) (Oral)   Resp 18   Ht 5' (1.524 m)   Wt 133 lb 8 oz (60.6 kg)   SpO2 97%  BMI 26.07 kg/m   Wt Readings from Last 3 Encounters:  09/06/17 133 lb 8 oz (60.6 kg)  07/07/17 129 lb 8 oz (58.7 kg)  07/01/17 129 lb 4 oz (58.6 kg)    Physical Exam  Constitutional: He appears well-developed and well-nourished. No distress.  HENT:  Mouth/Throat: Oropharynx is clear and moist. No oropharyngeal exudate.  Eyes: Pupils are equal, round, and reactive to light. Conjunctivae and EOM are normal. Right eye exhibits no discharge. Left eye exhibits no discharge. No scleral icterus.  EOMI Limited fundoscopic exam   Neck: Carotid bruit is not present.  Cardiovascular: Normal rate, regular rhythm and normal heart sounds.  No murmur heard. Pulmonary/Chest: Effort normal  and breath sounds normal. No respiratory distress. He has no wheezes. He has no rales.  Musculoskeletal: He exhibits no edema.  Neurological: He is alert.  CN grossly intact Station and gait intact  Skin: Skin is warm and dry. Rash noted.  R posteriolateral calf with chronic pruritic papular hyperkeratotic skin lesion with some excoriations and scale  Nursing note and vitals reviewed.    Results for orders placed or performed in visit on 07/01/17  Hemoglobin A1c  Result Value Ref Range   Hgb A1c MFr Bld 5.2 4.6 - 6.5 %  Vitamin B12  Result Value Ref Range   Vitamin B-12 558 211 - 911 pg/mL  Comprehensive metabolic panel  Result Value Ref Range   Sodium 143 135 - 145 mEq/L   Potassium 4.4 3.5 - 5.1 mEq/L   Chloride 107 96 - 112 mEq/L   CO2 30 19 - 32 mEq/L   Glucose, Bld 95 70 - 99 mg/dL   BUN 9 6 - 23 mg/dL   Creatinine, Ser 0.83 0.40 - 1.50 mg/dL   Total Bilirubin 0.8 0.2 - 1.2 mg/dL   Alkaline Phosphatase 73 39 - 117 U/L   AST 11 0 - 37 U/L   ALT 10 0 - 53 U/L   Total Protein 6.0 6.0 - 8.3 g/dL   Albumin 4.1 3.5 - 5.2 g/dL   Calcium 9.2 8.4 - 10.5 mg/dL   GFR 94.33 >60.00 mL/min  Lipid panel  Result Value Ref Range   Cholesterol 145 0 - 200 mg/dL   Triglycerides 43.0 0.0 - 149.0 mg/dL   HDL 47.90 >39.00 mg/dL   VLDL 8.6 0.0 - 40.0 mg/dL   LDL Cholesterol 89 0 - 99 mg/dL   Total CHOL/HDL Ratio 3    NonHDL 97.28       Assessment & Plan:   Problem List Items Addressed This Visit    Skin rash    Longstanding issue for years, has seen dermatologists in the past. Has tried and failed multiple treatments. Unclear etiology - may need biopsy of not already done. I suggested trial lotrisone (he has at home) scheduled BID for 2 wks and if no better f/u with derm. He agrees with plan.  ?plaque psoriasis vs chronic tinea vs sequelae of chronic prurigo nodularis      Essential hypertension    BP stable today - and he has been off his toprol XL due to concern for low blood  pressures. Will stay off for now. Continue to monitor. rec check blood pressure if recurrent vision changes I did also encourage good hydration status especially during summer months.      Change in vision - Primary    Story not consistent with retinal tear. Benign limited eye exam today. Poor visual acuity on testing, worsened compared to  06/2017 at [facility] (at that time he was R20/30, L20/40, B20/30). He is overdue for f/u with ophtho - advised to call and schedule f/u appt. He and daughter agree. I will go ahead and place referral to expedite evaluation.      Relevant Orders   Ambulatory referral to Ophthalmology   ANEMIA, PERNICIOUS (Chronic)    States he's due for B12 shot - provided today.       Relevant Medications   cyanocobalamin ((VITAMIN B-12)) injection 1,000 mcg (Completed)       Meds ordered this encounter  Medications  . cyanocobalamin ((VITAMIN B-12)) injection 1,000 mcg   Orders Placed This Encounter  Procedures  . Ambulatory referral to Ophthalmology    Referral Priority:   Routine    Referral Type:   Consultation    Referral Reason:   Specialty Services Required    Requested Specialty:   Ophthalmology    Number of Visits Requested:   1    Follow up plan: Return if symptoms worsen or fail to improve.  Ria Bush, MD

## 2017-09-06 NOTE — Patient Instructions (Addendum)
Vision screen today Schedule appointment with eye doctor. Stay off metoprolol at this time. Make sure you are staying well hydrated - increase water intake by 1-2 glasses a day ,especially in the summer heat.  Try lotrisone (clotrimazole and betamethasone) cream for right calf rash twice a day for 2 weeks. If rash no better, return to see dermatologist. Good to see yo today.

## 2017-09-07 DIAGNOSIS — H539 Unspecified visual disturbance: Secondary | ICD-10-CM | POA: Insufficient documentation

## 2017-09-07 NOTE — Assessment & Plan Note (Signed)
Story not consistent with retinal tear. Benign limited eye exam today. Poor visual acuity on testing, worsened compared to 06/2017 at [facility] (at that time he was R20/30, L20/40, B20/30). He is overdue for f/u with ophtho - advised to call and schedule f/u appt. He and daughter agree. I will go ahead and place referral to expedite evaluation.

## 2017-09-07 NOTE — Assessment & Plan Note (Addendum)
BP stable today - and he has been off his toprol XL due to concern for low blood pressures. Will stay off for now. Continue to monitor. rec check blood pressure if recurrent vision changes I did also encourage good hydration status especially during summer months.

## 2017-09-07 NOTE — Assessment & Plan Note (Addendum)
Longstanding issue for years, has seen dermatologists in the past. Has tried and failed multiple treatments. Unclear etiology - may need biopsy of not already done. I suggested trial lotrisone (he has at home) scheduled BID for 2 wks and if no better f/u with derm. He agrees with plan.  ?plaque psoriasis vs chronic tinea vs sequelae of chronic prurigo nodularis

## 2017-09-07 NOTE — Assessment & Plan Note (Signed)
States he's due for B12 shot - provided today.

## 2017-09-23 DIAGNOSIS — Z9842 Cataract extraction status, left eye: Secondary | ICD-10-CM | POA: Diagnosis not present

## 2017-09-23 DIAGNOSIS — H0288B Meibomian gland dysfunction left eye, upper and lower eyelids: Secondary | ICD-10-CM | POA: Diagnosis not present

## 2017-09-23 DIAGNOSIS — Z9841 Cataract extraction status, right eye: Secondary | ICD-10-CM | POA: Diagnosis not present

## 2017-09-23 DIAGNOSIS — H0288A Meibomian gland dysfunction right eye, upper and lower eyelids: Secondary | ICD-10-CM | POA: Diagnosis not present

## 2017-09-23 DIAGNOSIS — H04123 Dry eye syndrome of bilateral lacrimal glands: Secondary | ICD-10-CM | POA: Diagnosis not present

## 2017-09-23 DIAGNOSIS — H52223 Regular astigmatism, bilateral: Secondary | ICD-10-CM | POA: Diagnosis not present

## 2017-10-28 ENCOUNTER — Telehealth: Payer: Self-pay

## 2017-10-28 DIAGNOSIS — D510 Vitamin B12 deficiency anemia due to intrinsic factor deficiency: Secondary | ICD-10-CM

## 2017-10-28 NOTE — Telephone Encounter (Signed)
I cannot find a recent order for current Vit B 12; pt last received B12 injection on 09/06/17.Please advise.

## 2017-10-28 NOTE — Telephone Encounter (Signed)
Copied from [place] [id number]. Topic: General - Other >> Oct 28, 2017 12:33 PM Judyann Munson wrote: Reason for CRM: patient is requesting a B-12. Please advise

## 2017-10-28 NOTE — Telephone Encounter (Signed)
He has a history of pernicious anemia which requires IM B12.  Let's recheck his B12 level first then injection. When was his last B12 injection? August?

## 2017-11-01 NOTE — Telephone Encounter (Signed)
Confirm with patient regarding his last B12 was in August. He didn't give me a clear answer why he skip September. Lab appt 11/03/2017.

## 2017-11-01 NOTE — Telephone Encounter (Signed)
Noted  

## 2017-11-03 ENCOUNTER — Other Ambulatory Visit (INDEPENDENT_AMBULATORY_CARE_PROVIDER_SITE_OTHER): Payer: Medicare Other

## 2017-11-03 ENCOUNTER — Ambulatory Visit (INDEPENDENT_AMBULATORY_CARE_PROVIDER_SITE_OTHER): Payer: Medicare Other

## 2017-11-03 DIAGNOSIS — Z23 Encounter for immunization: Secondary | ICD-10-CM

## 2017-11-03 DIAGNOSIS — D510 Vitamin B12 deficiency anemia due to intrinsic factor deficiency: Secondary | ICD-10-CM | POA: Diagnosis not present

## 2017-11-03 LAB — VITAMIN B12: VITAMIN B 12: 330 pg/mL (ref 211–911)

## 2017-11-11 ENCOUNTER — Ambulatory Visit (INDEPENDENT_AMBULATORY_CARE_PROVIDER_SITE_OTHER): Payer: Medicare Other | Admitting: *Deleted

## 2017-11-11 DIAGNOSIS — E538 Deficiency of other specified B group vitamins: Secondary | ICD-10-CM

## 2017-11-11 IMAGING — US US ABDOMEN COMPLETE
1 series · 14 of 25 positions shown · non-contrast
Comparison: Abdomen CT obtained yesterday.

CLINICAL DATA: Abdominal pain for the past 4 days. Previous
cholecystectomy.

EXAM:
ABDOMEN ULTRASOUND COMPLETE

[Series 1: us abdomen complete · 0.26mm/px · 14 of 63 slices shown]
[im 1/63]
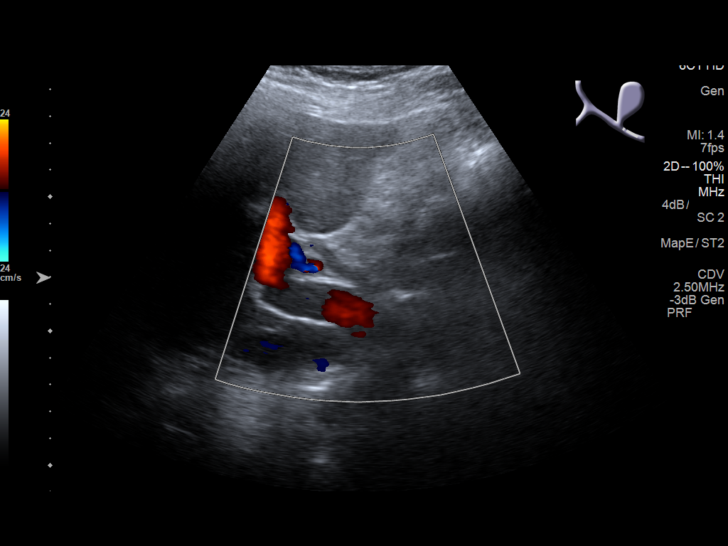
[im 6/63]
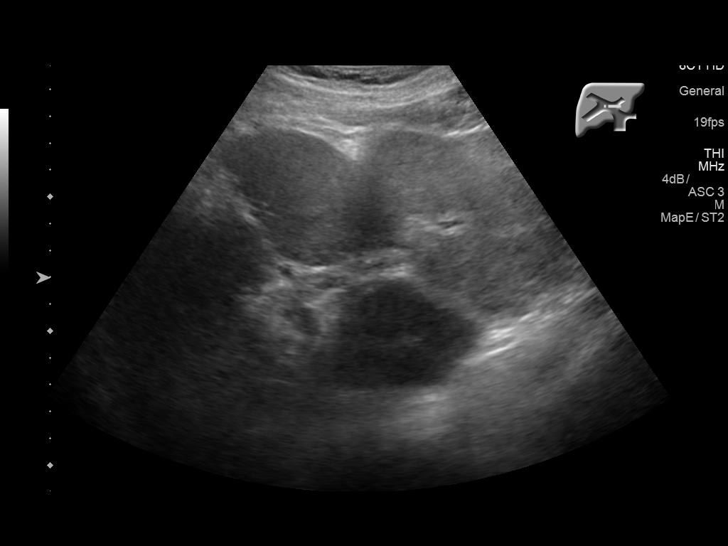
[im 11/63]
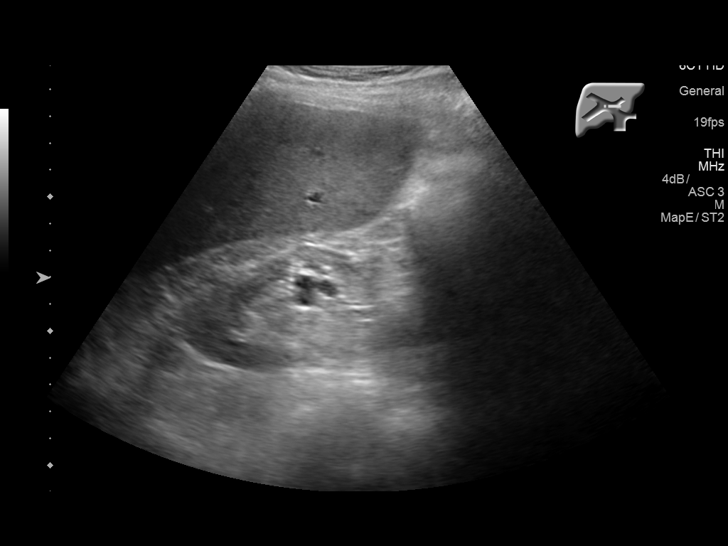
[im 16/63]
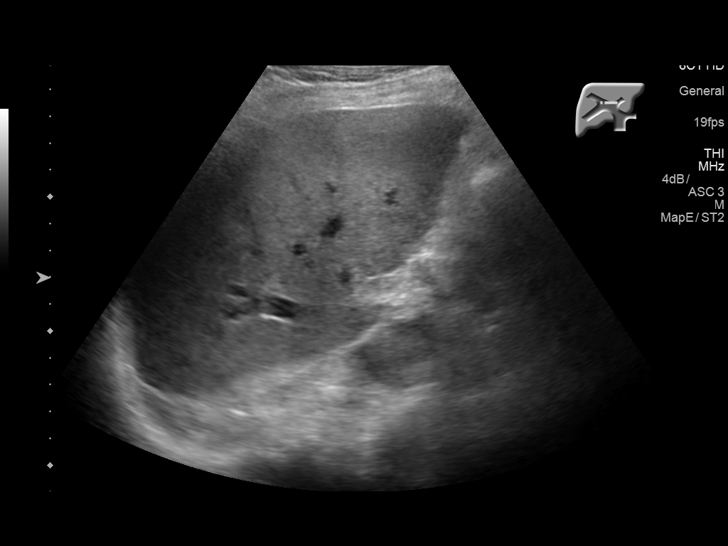
[im 21/63]
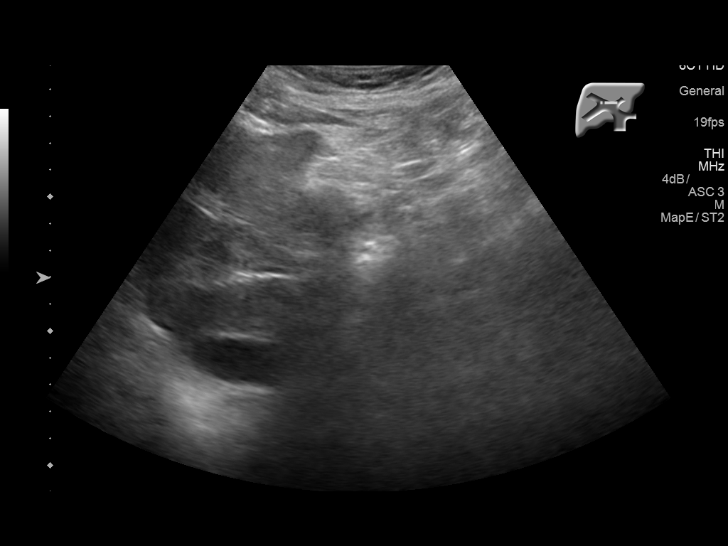
[im 24/63]
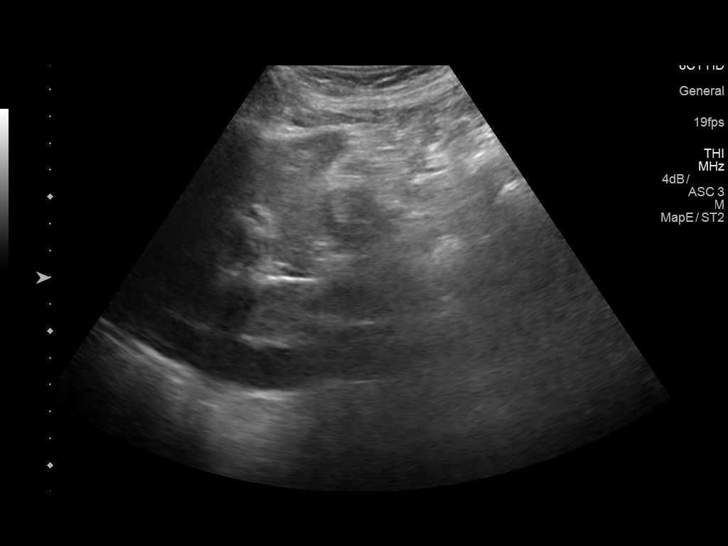
[im 29/63]
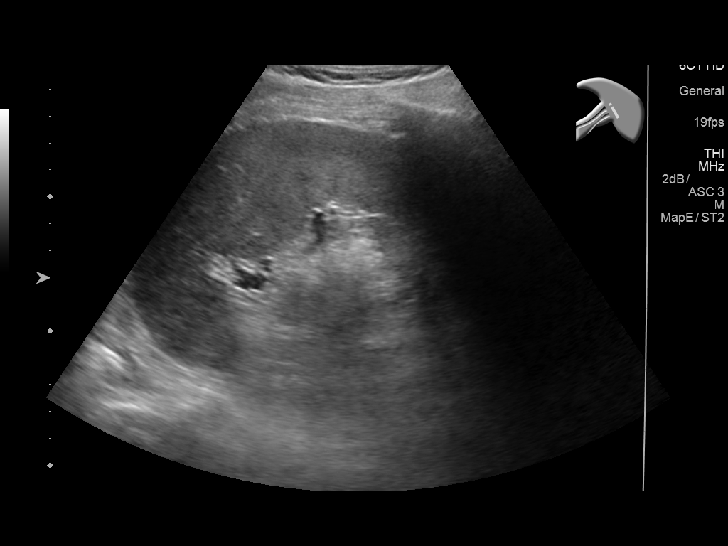
[im 34/63]
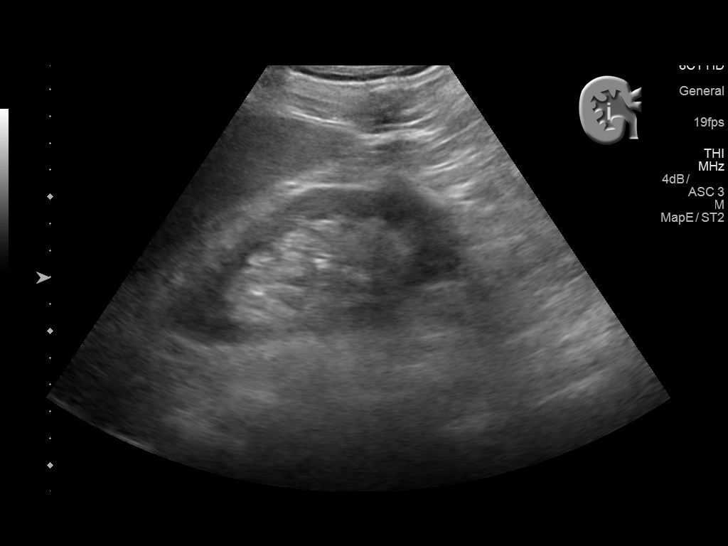
[im 39/63]
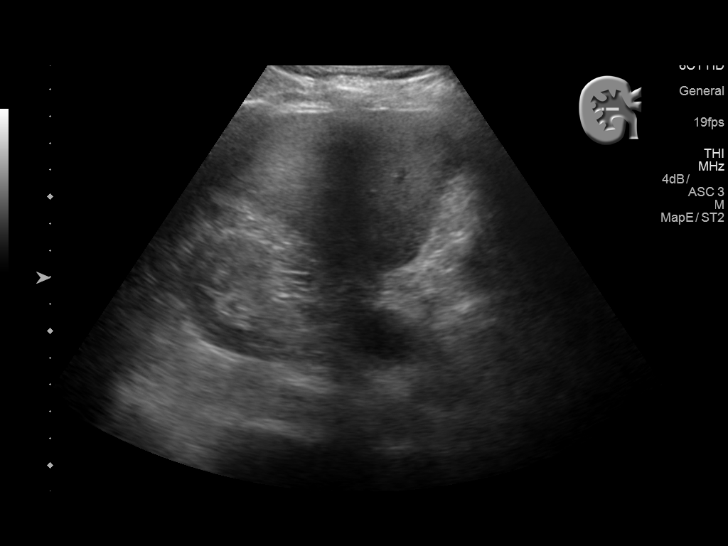
[im 42/63]
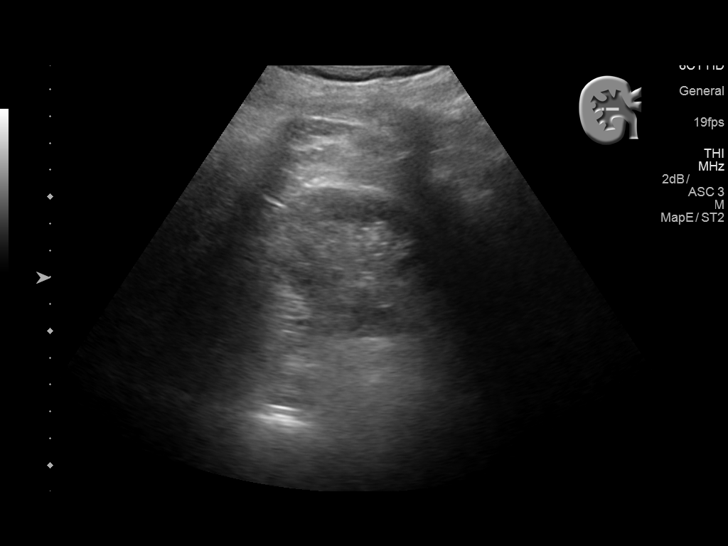
[im 47/63]
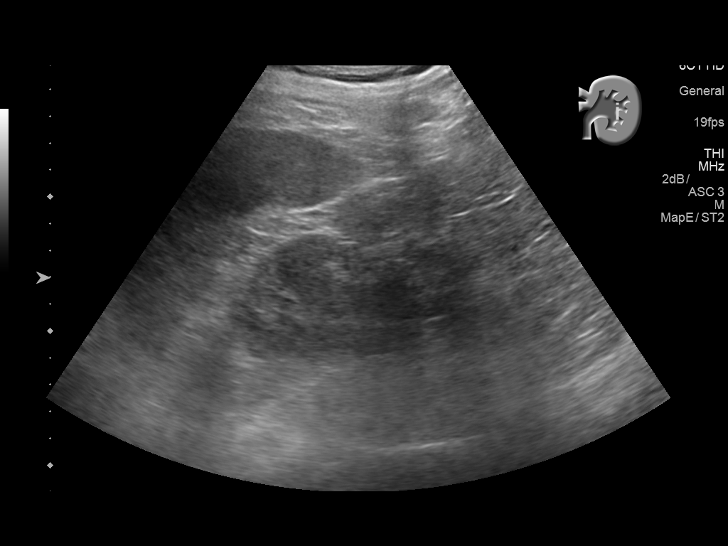
[im 52/63]
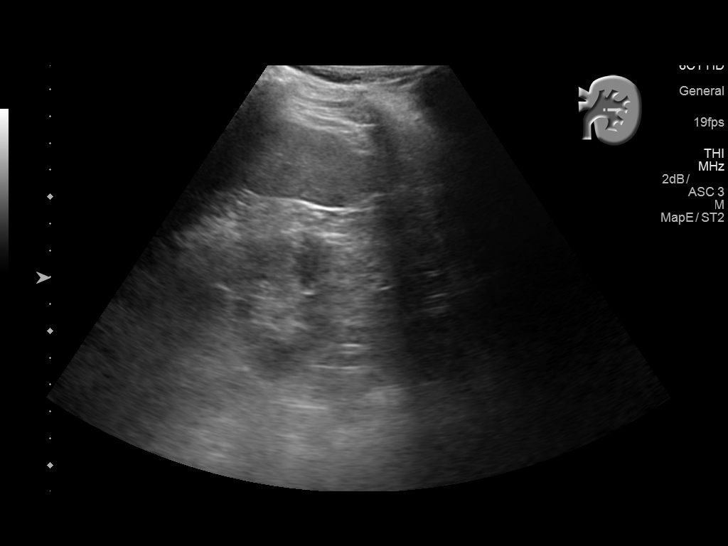
[im 57/63]
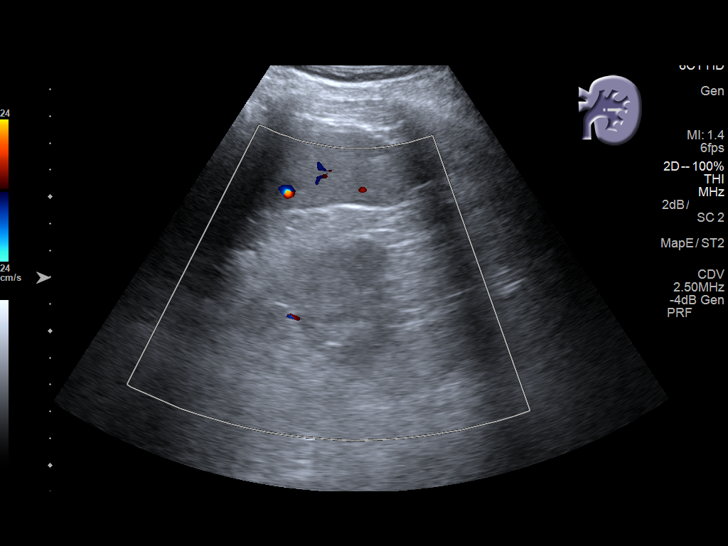
[im 63/63]
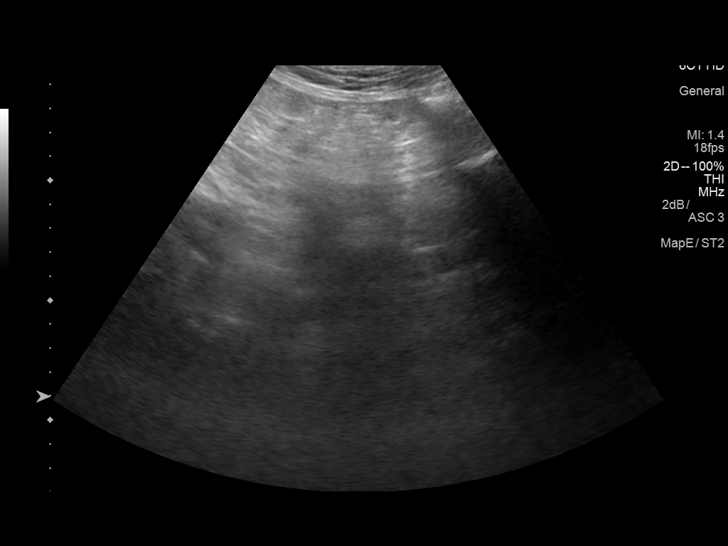

[14 of 25 positions shown; findings below may reference images not displayed]

FINDINGS: Gallbladder: Surgically absent.

Common bile duct: Diameter: 5.1 mm.

Liver: No focal lesion identified. Within normal limits in
parenchymal echogenicity.

IVC: No abnormality visualized.

Pancreas: Visualized portion unremarkable.

Spleen: Size and appearance within normal limits.

Right Kidney: Length: 11.6 cm. Normal echogenicity. Diffuse cortical
thinning with prominent renal sinus fat.

Left Kidney: Length: 12.0 cm.. Normal echogenicity. Mild diffuse
cortical thinning. 1.8 cm parapelvic cyst.

Abdominal aorta: No aneurysm visualized.

Other findings: None.
IMPRESSION: 1. No acute abnormality.
2. Mild diffuse bilateral renal cortical atrophy.

## 2017-11-11 MED ORDER — CYANOCOBALAMIN 1000 MCG/ML IJ SOLN
1000.0000 ug | Freq: Once | INTRAMUSCULAR | Status: AC
  Administered 2017-11-11: 1000 ug via INTRAMUSCULAR

## 2017-11-11 NOTE — Progress Notes (Signed)
Per orders of Allie Bossier, NP-C, injection of B12 1000 mcg/mL given by Vikrant Pryce. Patient tolerated injection well.

## 2017-11-28 ENCOUNTER — Other Ambulatory Visit: Payer: Self-pay | Admitting: Primary Care

## 2017-11-28 DIAGNOSIS — N401 Enlarged prostate with lower urinary tract symptoms: Secondary | ICD-10-CM

## 2017-11-28 DIAGNOSIS — R351 Nocturia: Principal | ICD-10-CM

## 2017-12-07 DIAGNOSIS — Z961 Presence of intraocular lens: Secondary | ICD-10-CM | POA: Diagnosis not present

## 2017-12-07 DIAGNOSIS — H26492 Other secondary cataract, left eye: Secondary | ICD-10-CM | POA: Diagnosis not present

## 2018-01-02 DIAGNOSIS — H26491 Other secondary cataract, right eye: Secondary | ICD-10-CM | POA: Diagnosis not present

## 2018-01-03 DIAGNOSIS — Z961 Presence of intraocular lens: Secondary | ICD-10-CM | POA: Diagnosis not present

## 2018-01-03 DIAGNOSIS — H26491 Other secondary cataract, right eye: Secondary | ICD-10-CM | POA: Diagnosis not present

## 2018-01-12 DIAGNOSIS — J180 Bronchopneumonia, unspecified organism: Secondary | ICD-10-CM | POA: Diagnosis not present

## 2018-01-26 ENCOUNTER — Telehealth: Payer: Self-pay | Admitting: Primary Care

## 2018-01-26 DIAGNOSIS — D510 Vitamin B12 deficiency anemia due to intrinsic factor deficiency: Secondary | ICD-10-CM

## 2018-01-26 NOTE — Telephone Encounter (Signed)
Pt stopped by to schedule b12 inj. He hasn't had one since October. Is this ok to schedule?

## 2018-01-26 NOTE — Telephone Encounter (Signed)
Based off of his chart it appears that he is getting B12 injections every 3 months on average. Okay to schedule for B12 injection monthly until seen by me in July 2020.  I would like to repeat B12 lab scheduled for 3 months as well.

## 2018-01-30 NOTE — Telephone Encounter (Signed)
Attempted to reach pt with no answer. No voicemail setup.

## 2018-02-02 ENCOUNTER — Ambulatory Visit (INDEPENDENT_AMBULATORY_CARE_PROVIDER_SITE_OTHER): Payer: Medicare Other

## 2018-02-02 DIAGNOSIS — E538 Deficiency of other specified B group vitamins: Secondary | ICD-10-CM

## 2018-02-02 MED ORDER — CYANOCOBALAMIN 1000 MCG/ML IJ SOLN
1000.0000 ug | Freq: Once | INTRAMUSCULAR | Status: AC
  Administered 2018-02-02: 1000 ug via INTRAMUSCULAR

## 2018-02-02 NOTE — Progress Notes (Signed)
Pt given 1000mg /ml B12 in Right Deltoid. Tolerated well. I advised him that we would need to start over on the monthly injections for 6 months since he missed November and December. I made sure he made his next B12 appointment for next month.   He asked me to check his BP because he said thought it might be really high. It was 132/70. I advised him that was a normal number. He felt relieved.

## 2018-03-09 ENCOUNTER — Ambulatory Visit (INDEPENDENT_AMBULATORY_CARE_PROVIDER_SITE_OTHER): Payer: Medicare Other

## 2018-03-09 DIAGNOSIS — E538 Deficiency of other specified B group vitamins: Secondary | ICD-10-CM

## 2018-03-09 MED ORDER — CYANOCOBALAMIN 1000 MCG/ML IJ SOLN
1000.0000 ug | Freq: Once | INTRAMUSCULAR | Status: AC
  Administered 2018-03-09: 1000 ug via INTRAMUSCULAR

## 2018-03-09 NOTE — Progress Notes (Signed)
Pt received 1000mg /ml monthly B12 injection in Left Deltoid. Tolerated well.

## 2018-04-10 ENCOUNTER — Telehealth: Payer: Self-pay | Admitting: Primary Care

## 2018-04-10 NOTE — Telephone Encounter (Signed)
Called and left a message on patients machine stating he could try to take b12 tablets until the covid-19 is over and he can schedule his next one in a month also needs a lab draw .  Lincy Belles,cma

## 2018-04-10 NOTE — Telephone Encounter (Signed)
Pt want to come in and get his B-12 shot but wanted to make sure if it was necessary. Please advise pt

## 2018-04-10 NOTE — Telephone Encounter (Signed)
Okay to skip this month. Has he ever taken oral vitamin B12 tablets before? It may be work taking once daily for now until he can resume B12 injections. Please reschedule his b12 for one month, also remind him that he will be due for lab draw for B12.

## 2018-04-11 ENCOUNTER — Ambulatory Visit: Payer: Medicare Other

## 2018-05-24 ENCOUNTER — Other Ambulatory Visit: Payer: Self-pay | Admitting: Primary Care

## 2018-05-24 DIAGNOSIS — N401 Enlarged prostate with lower urinary tract symptoms: Secondary | ICD-10-CM

## 2018-05-24 DIAGNOSIS — R351 Nocturia: Principal | ICD-10-CM

## 2018-07-11 ENCOUNTER — Ambulatory Visit (INDEPENDENT_AMBULATORY_CARE_PROVIDER_SITE_OTHER): Payer: Medicare Other | Admitting: *Deleted

## 2018-07-11 ENCOUNTER — Other Ambulatory Visit (INDEPENDENT_AMBULATORY_CARE_PROVIDER_SITE_OTHER): Payer: Medicare Other

## 2018-07-11 ENCOUNTER — Other Ambulatory Visit: Payer: Self-pay

## 2018-07-11 DIAGNOSIS — E538 Deficiency of other specified B group vitamins: Secondary | ICD-10-CM | POA: Diagnosis not present

## 2018-07-11 DIAGNOSIS — D510 Vitamin B12 deficiency anemia due to intrinsic factor deficiency: Secondary | ICD-10-CM | POA: Diagnosis not present

## 2018-07-11 LAB — VITAMIN B12: Vitamin B-12: 417 pg/mL (ref 211–911)

## 2018-07-11 MED ORDER — CYANOCOBALAMIN 1000 MCG/ML IJ SOLN
1000.0000 ug | Freq: Once | INTRAMUSCULAR | Status: AC
  Administered 2018-07-11: 1000 ug via INTRAMUSCULAR

## 2018-07-11 NOTE — Progress Notes (Signed)
Per orders of Allie Bossier, NP, injection of B12 inj given by Tammi Sou. Patient tolerated injection well.

## 2018-07-13 NOTE — Progress Notes (Signed)
Approved.  

## 2018-07-17 ENCOUNTER — Other Ambulatory Visit: Payer: Medicare Other

## 2018-07-17 ENCOUNTER — Ambulatory Visit (INDEPENDENT_AMBULATORY_CARE_PROVIDER_SITE_OTHER): Payer: Medicare Other

## 2018-07-17 ENCOUNTER — Other Ambulatory Visit: Payer: Self-pay

## 2018-07-17 DIAGNOSIS — Z0000 Encounter for general adult medical examination without abnormal findings: Secondary | ICD-10-CM | POA: Diagnosis not present

## 2018-07-17 NOTE — Progress Notes (Signed)
PCP notes:   Health maintenance:  No gaps identified  Abnormal screenings:   None  Patient concerns:   None  Nurse concerns:  None  Next PCP appt:   07/20/18 @ 0940

## 2018-07-17 NOTE — Progress Notes (Signed)
Subjective:   [name] is a 85 y.o. male who presents for Medicare Annual/Subsequent preventive examination.  Review of Systems:  N/A Cardiac Risk Factors include: advanced age (>33mn, >>55women);male gender     Objective:    Vitals: There were no vitals taken for this visit.  There is no height or weight on file to calculate BMI.  Advanced Directives 07/17/2018 07/01/2017 05/27/2017 03/22/2017 03/21/2017 06/29/2016 12/22/2015  Does Patient Have a Medical Advance Directive? Yes Yes Yes No No Yes Yes  Type of AParamedicof ALovel[place]Living will [place] will [place] will;Out of facility DNR (pink MOST or yellow form) - - HPress photographerLiving will Living will;Healthcare Power of Attorney  Does patient want to make changes to medical advance directive? No - Patient declined - No - Patient declined - - - No - Patient declined  Copy of [place] Chart? Yes - validated most recent copy scanned in chart (See row information) No - copy requested No - copy requested - - Yes No - copy requested  Would patient like information on creating a medical advance directive? - - - No - Patient declined No - Patient declined - No - Patient declined  Pre-existing out of facility DNR order (yellow form or pink MOST form) - - (No Data) - - - -    Tobacco Social History   Tobacco Use  Smoking Status Former Smoker  . Types: Pipe  . Quit date: 01/18/1966  . Years since quitting: 52.5  Smokeless Tobacco Never Used     Counseling given: No   Clinical Intake:  Pre-visit preparation completed: Yes  Pain : 0-10 Pain Score: 3  Pain Location: Generalized Pain Onset: More than a month ago Pain Frequency: Constant     Nutritional Status: BMI of 19-24  Normal Nutritional Risks: None Diabetes: No  How often do you need to have someone help you when you read instructions, pamphlets, or  other written materials from your doctor or pharmacy?: 1 - Never What is the last grade level you completed in school?: 12th grade  Interpreter Needed?: No  Comments: pt is a widower and lives independently Information entered by :: LPinson, RN  Past Medical History:  Diagnosis Date  . Arthralgia of multiple joints    limited mobility w/  independant adl's  . BPH with obstruction/lower urinary tract symptoms   . Chronic idiopathic monocytosis    followed by dr gWaymon Budge-  per lov note 06/ 2017 persistant unexplained  with normal bone barrow bx  . Coronary atherosclerosis of unspecified type of vessel, native or graft    cardiologist-  dr cStanford Breed-- per lov note 11-19-2014 ,  cardiac cath in 2000-- pLAD 60-70%,  small intermediate branch 70-80%,  mRCA 20%,  normal LV  . Degenerative arthritis of spine    cervical and lumar  . Diverticulosis of colon (without mention of hemorrhage)   . Dysuria    chronic  . Elevated PSA   . Feeling of incomplete bladder emptying   . Hiatal hernia    moderate per ct 11/ 2017  . History of adenomatous polyp of colon    2008  . History of atrial fibrillation    remote hx episode  . History of pancreatitis    07-01-2013  and 10-10-2015  . History of prostatitis    2014  . Hx of colonic polyps   . Hyperlipidemia   .  Hypertension   . Mouth sore    roof of mouth sore   . Nephrolithiasis   . Nocturia more than twice per night    severe  w/ leakage  . OSA (obstructive sleep apnea)    INTOLERANT CPAP  . Osteoarthritis   . Pernicious anemia    B12  Def.  . Peyronie disease   . Renal insufficiency   . Thrombocytopenia, unspecified ([place]) hemotology/oncologist-  dr Waymon Budge   per dr Velta Addison note 06/ 2016  secondary to vitro clumping   Past Surgical History:  Procedure Laterality Date  . BONE MARROW BIOPSY  2011   normal  . CARDIAC CATHETERIZATION  2000   per dr Kathyrn Drown note --  dLAD 60-70%,  small intermediate branch  70-80%,  mRCA 20%,  normal LV  . CARDIOVASCULAR STRESS TEST  01-29-2013   dr Stanford Breed   normal nuclear study w/ no ischemia,  normal LV function and wall motion , ef 82%  . CATARACT EXTRACTION W/ INTRAOCULAR LENS  IMPLANT, BILATERAL  08/2015  . CHOLECYSTECTOMY  11/24/2010   Procedure: LAPAROSCOPIC CHOLECYSTECTOMY WITH INTRAOPERATIVE CHOLANGIOGRAM;  Surgeon: Earnstine Regal, MD;  Location: [facility];  Service: General;  Laterality: N/A;  c-arm  . CYSTOSCOPY W/ URETERAL STENT PLACEMENT Left 12/17/2015   Procedure: CYSTOSCOPY WITH RETROGRADE PYELOGRAM/URETERAL STENT PLACEMENT;  Surgeon: Ardis Hughs, MD;  Location: [facility];  Service: Urology;  Laterality: Left;  . CYSTOSCOPY WITH RETROGRADE PYELOGRAM, URETEROSCOPY AND STENT PLACEMENT Left 12/22/2015   Procedure: CYSTOSCOPY WITH LEFT RETROGRADE  URETEROSCOPY AND STENT PLACEMENT;  Surgeon: Carolan Clines, MD;  Location: [facility];  Service: Urology;  Laterality: Left;  . ERCP N/A 07/04/2013   Procedure: ENDOSCOPIC RETROGRADE CHOLANGIOPANCREATOGRAPHY (ERCP);  Surgeon: Gatha Mayer, MD;  Location: [facility];  Service: Endoscopy;  Laterality: N/A;  MAC if available  . ERCP N/A 10/12/2015   Procedure: ENDOSCOPIC RETROGRADE CHOLANGIOPANCREATOGRAPHY (ERCP);  Surgeon: [name], MD;  Location: [facility];  Service: Endoscopy;  Laterality: N/A;  . HOLMIUM LASER APPLICATION Left 73/02/2023   Procedure: HOLMIUM LASER APPLICATION;  Surgeon: Carolan Clines, MD;  Location: [facility];  Service: Urology;  Laterality: Left;  . INGUINAL HERNIA REPAIR Right 01/25/2002  . KNEE ARTHROSCOPY  x5  . SATURATION BIOPSY OF PROSTATE  05-29-2007  and 01-26-2008  . SHOULDER ARTHROSCOPY WITH OPEN ROTATOR CUFF REPAIR AND DISTAL CLAVICLE ACROMINECTOMY Right 11/19/2004  . TOTAL HIP ARTHROPLASTY Right 05/21/2009  . TOTAL KNEE ARTHROPLASTY Bilateral left 05-02-2003/  right  03-23-2010  . TRANSTHORACIC ECHOCARDIOGRAM  12/23/2004   ef 60%,  mild MV calcification without stenosis/  mild TR,  PASP 30mHg  . UVULOPALATOPHARYNGOPLASTY  1993    w/  T & A   Family History  Problem Relation Age of Onset  . Colon cancer Father 429 . Peripheral vascular disease Mother    Social History   Socioeconomic History  . Marital status: Widowed    Spouse name: Not on file  . Number of children: 2  . Years of education: Not on file  . Highest education level: Not on file  Occupational History  . 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 Financial resource strain: Not on file  . Food insecurity    Worry: Not on file    Inability: Not on file  . Transportation needs    Medical: Not on file    Non-medical: Not on file  Tobacco Use  . Smoking  status: Former Smoker    Types: Pipe    Quit date: 01/18/1966    Years since quitting: 52.5  . Smokeless tobacco: Never Used  Substance and Sexual Activity  . Alcohol use: No    Alcohol/week: 0.0 standard drinks  . Drug use: No  . Sexual activity: Never  Lifestyle  . Physical activity    Days per week: Not on file    Minutes per session: Not on file  . Stress: Not on file  Relationships  . Social Herbalist on phone: Not on file    Gets together: Not on file    Attends religious service: Not on file    Active member of club or organization: Not on file    Attends meetings of clubs or organizations: Not on file    Relationship status: Not on file  Other Topics Concern  . Not on file  Social History Narrative   DNR   Widowed   2 daughters leave near by    Outpatient Encounter Medications as of 07/17/2018  Medication Sig  . acetaminophen (TYLENOL) 325 MG tablet Take 650 mg by mouth every 6 (six) hours as needed for mild pain.  Marland Kitchen albuterol (PROVENTIL HFA;VENTOLIN HFA) 108 (90 Base) MCG/ACT inhaler Inhale 2 puffs into the lungs every 6 (six) hours as needed. (Patient not taking: Reported on 07/17/2018)  . Artificial Tear Solution (TEARS NATURALE OP) Place  1-2 drops into both eyes at bedtime as needed (dry eyes).  . fluticasone (FLONASE) 50 MCG/ACT nasal spray Place 1 spray into both nostrils as needed for allergies or rhinitis.  . Loratadine (CLARITIN PO) Take 1 tablet by mouth daily.   . metoprolol succinate (TOPROL-XL) 25 MG 24 hr tablet Take 1 tablet (25 mg total) by mouth daily. (Patient not taking: Reported on 09/06/2017)  . Multiple Vitamin (MULTIVITAMIN) tablet Take 1 tablet by mouth daily.  . tamsulosin (FLOMAX) 0.4 MG CAPS capsule TAKE 1 CAPSULE BY MOUTH ONCE DAILY AFTERSUPPER FOR ENLARGED PROSTATE (Patient not taking: Reported on 07/17/2018)   No facility-administered encounter medications on file as of 07/17/2018.     Activities of Daily Living In your present state of health, do you have any difficulty performing the following activities: 07/17/2018  Hearing? Y  Vision? N  Difficulty concentrating or making decisions? Y  Walking or climbing stairs? N  Dressing or bathing? N  Doing errands, shopping? N  Preparing Food and eating ? N  Using the Toilet? N  In the past six months, have you accidently leaked urine? N  Do you have problems with loss of bowel control? N  Managing your Medications? N  Managing your Finances? N  Housekeeping or managing your Housekeeping? N  Some recent data might be hidden    Patient Care Team: Pleas Koch, NP as PCP - General (Internal Medicine) Annia Belt, MD as Consulting Physician (Oncology) Carolan Clines, MD (Inactive) as Consulting Physician (Urology) Lafayette Dragon, MD (Inactive) as Consulting Physician (Gastroenterology) Lelon Perla, MD as Consulting Physician (Cardiology)   Assessment:   This is a routine wellness examination for [place].  Exercise Activities and Dietary recommendations Current Exercise Habits: The patient does not participate in regular exercise at present, Exercise limited by: orthopedic condition(s)  Goals    . emotional health      Staring 07/17/2018,I will continue to make wind chimes and do other hobbies as desired.        Fall Risk Fall Risk  07/17/2018  07/01/2017 06/29/2016 11/11/2015 03/12/2015  Falls in the past year? 1 Yes Yes No Yes  Comment multiple falls due to balance - pt fell in yard; pt fell while climbing steps - -  Number falls in past yr: 1 2 or more 2 or more - 1  Comment - - - - -  Injury with Fall? 0 No No - Yes  Comment - - - - contusion, scrape  Risk Factor Category  - High Fall Risk - - -  Risk for fall due to : - Impaired balance/gait;Impaired mobility - - -  Follow up - - - - -   Depression Screen PHQ 2/9 Scores 07/17/2018 07/01/2017 06/29/2016 11/11/2015  PHQ - 2 Score 1 6 0 0  PHQ- 9 Score 1 22 - -    Cognitive Function MMSE - Mini Mental State Exam 07/17/2018 07/01/2017 06/29/2016  Orientation to time _0 Orientation to Place _1 Registration _2 Attention/ Calculation 0 0 0  Recall _3 Language- name 2 objects 0 0 0  Language- repeat _4 Language- follow 3 step command 0 3 3  Language- read & follow direction 0 0 0  Write a sentence 0 0 0  Copy design 0 0 0  Total score _5 PLEASE NOTE: A Mini-Cog screen was completed. Maximum score is 17. A value of 0 denotes this part of Folstein MMSE was not completed or the patient failed this part of the Mini-Cog screening.   Mini-Cog Screening Orientation to Time - Max 5 pts Orientation to Place - Max 5 pts Registration - Max 3 pts Recall - Max 3 pts Language Repeat - Max 1 pts      Immunization History  Administered Date(s) Administered  . Influenza Split 11/12/2010, 10/26/2011  . Influenza Whole 11/14/2007, 10/24/2008, 10/16/2009  . Influenza,inj,Quad PF,6+ Mos 09/21/2012, 09/06/2013, 10/24/2014, 12/30/2015, 11/24/2016, 11/03/2017  . Pneumococcal Conjugate-13 09/06/2013  . Pneumococcal Polysaccharide-23 01/11/2007  . Td 05/05/2011  . Zoster 03/13/2007    Screening Tests Health Maintenance  Topic  Date Due  . INFLUENZA VACCINE  08/19/2018  . TETANUS/TDAP  05/04/2021  . PNA vac Low Risk Adult  Completed      Plan:   I have personally reviewed, addressed, and noted the following in the patient's chart:  A. Medical and social history B. Use of alcohol, tobacco or illicit drugs  C. Current medications and supplements D. Functional ability and status E.  Nutritional status F.  Physical activity G. Advance directives H. List of other physicians I.  Hospitalizations, surgeries, and ER visits in previous 12 months J.  Vitals (unless it is a telemedicine encounter) K. Screenings to include cognitive, depression, hearing, vision (NOTE: hearing and vision screenings not completed in telemedicine encounter) L. Referrals and appointments   In addition, I have reviewed and discussed with patient certain preventive protocols, quality metrics, and best practice recommendations. A written personalized care plan for preventive services and recommendations were provided to patient.  With patient's permission, we connected on 07/17/18 at  8:30 AM EDT. Interactive audio and video telecommunications were attempted with patient. This attempt was unsuccessful due to patient having technical difficulties OR patient did not have access to video capability.  Encounter was completed with audio only.  Two patient identifiers were used to ensure the encounter occurred with the correct person. Patient was in home and writer was in office.  Signed,   Lindell Noe, MHA, BS, RN Health Coach

## 2018-07-17 NOTE — Progress Notes (Signed)
I reviewed health advisor's note, was available for consultation, and agree with documentation and plan.  

## 2018-07-17 NOTE — Patient Instructions (Signed)
[name] , Thank you for taking time to come for your Medicare Wellness Visit. I appreciate your ongoing commitment to your health goals. Please review the following plan we discussed and let me know if I can assist you in the future.   These are the goals we discussed: Goals    . emotional health     Staring 07/17/2018,I will continue to make wind chimes and do other hobbies as desired.        This is a list of the screening recommended for you and due dates:  Health Maintenance  Topic Date Due  . Flu Shot  08/19/2018  . Tetanus Vaccine  05/04/2021  . Pneumonia vaccines  Completed   Preventive Care for Adults  A healthy lifestyle and preventive care can promote health and wellness. Preventive health guidelines for adults include the following key practices.  . A routine yearly physical is a good way to check with your health care provider about your health and preventive screening. It is a chance to share any concerns and updates on your health and to receive a thorough exam.  . Visit your dentist for a routine exam and preventive care every 6 months. Brush your teeth twice a day and floss once a day. Good oral hygiene prevents tooth decay and gum disease.  . The frequency of eye exams is based on your age, health, family medical history, use  of contact lenses, and other factors. Follow your health care provider's recommendations for frequency of eye exams.  . Eat a healthy diet. Foods like vegetables, fruits, whole grains, low-fat dairy products, and lean protein foods contain the nutrients you need without too many calories. Decrease your intake of foods high in solid fats, added sugars, and salt. Eat the right amount of calories for you. Get information about a proper diet from your health care provider, if necessary.  . Regular physical exercise is one of the most important things you can do for your health. Most adults should get at least 150 minutes of moderate-intensity  exercise (any activity that increases your heart rate and causes you to sweat) each week. In addition, most adults need muscle-strengthening exercises on 2 or more days a week.  Silver Sneakers may be a benefit available to you. To determine eligibility, you may visit the website: www.silversneakers.com or contact program at 8315273160 Mon-Fri between 8AM-8PM.   . Maintain a healthy weight. The body mass index (BMI) is a screening tool to identify possible weight problems. It provides an estimate of body fat based on height and weight. Your health care provider can find your BMI and can help you achieve or maintain a healthy weight.   For adults 20 years and older: ? A BMI below 18.5 is considered underweight. ? A BMI of 18.5 to 24.9 is normal. ? A BMI of 25 to 29.9 is considered overweight. ? A BMI of 30 and above is considered obese.   . Maintain normal blood lipids and cholesterol levels by exercising and minimizing your intake of saturated fat. Eat a balanced diet with plenty of fruit and vegetables. Blood tests for lipids and cholesterol should begin at age 51 and be repeated every 5 years. If your lipid or cholesterol levels are high, you are over 50, or you are at high risk for heart disease, you may need your cholesterol levels checked more frequently. Ongoing high lipid and cholesterol levels should be treated with medicines if diet and exercise are not working.  Marland Kitchen  If you smoke, find out from your health care provider how to quit. If you do not use tobacco, please do not start.  . If you choose to drink alcohol, please do not consume more than 2 drinks per day. One drink is considered to be 12 ounces (355 mL) of beer, 5 ounces (148 mL) of wine, or 1.5 ounces (44 mL) of liquor.  . If you are 3-35 years old, ask your health care provider if you should take aspirin to prevent strokes.  . Use sunscreen. Apply sunscreen liberally and repeatedly throughout the day. You should seek shade  when your shadow is shorter than you. Protect yourself by wearing long sleeves, pants, a wide-brimmed hat, and sunglasses year round, whenever you are outdoors.  . Once a month, do a whole body skin exam, using a mirror to look at the skin on your back. Tell your health care provider of new moles, moles that have irregular borders, moles that are larger than a pencil eraser, or moles that have changed in shape or color.

## 2018-07-18 ENCOUNTER — Other Ambulatory Visit: Payer: Self-pay | Admitting: Primary Care

## 2018-07-19 ENCOUNTER — Encounter: Payer: Medicare Other | Admitting: Primary Care

## 2018-07-20 ENCOUNTER — Encounter: Payer: Medicare Other | Admitting: Primary Care

## 2018-07-20 DIAGNOSIS — Z0289 Encounter for other administrative examinations: Secondary | ICD-10-CM

## 2018-08-10 DIAGNOSIS — H905 Unspecified sensorineural hearing loss: Secondary | ICD-10-CM | POA: Diagnosis not present

## 2018-08-11 ENCOUNTER — Other Ambulatory Visit: Payer: Self-pay

## 2018-08-11 DIAGNOSIS — R6889 Other general symptoms and signs: Secondary | ICD-10-CM | POA: Diagnosis not present

## 2018-08-11 DIAGNOSIS — Z20822 Contact with and (suspected) exposure to covid-19: Secondary | ICD-10-CM

## 2018-08-14 LAB — NOVEL CORONAVIRUS, NAA: SARS-CoV-2, NAA: NOT DETECTED

## 2018-08-22 ENCOUNTER — Encounter: Payer: Self-pay | Admitting: Primary Care

## 2018-08-22 ENCOUNTER — Ambulatory Visit (INDEPENDENT_AMBULATORY_CARE_PROVIDER_SITE_OTHER): Payer: Medicare Other | Admitting: Primary Care

## 2018-08-22 ENCOUNTER — Other Ambulatory Visit: Payer: Self-pay

## 2018-08-22 VITALS — BP 120/66 | HR 76 | Temp 98.4°F | Ht 60.0 in | Wt 133.5 lb

## 2018-08-22 DIAGNOSIS — Z0000 Encounter for general adult medical examination without abnormal findings: Secondary | ICD-10-CM | POA: Diagnosis not present

## 2018-08-22 DIAGNOSIS — D510 Vitamin B12 deficiency anemia due to intrinsic factor deficiency: Secondary | ICD-10-CM

## 2018-08-22 DIAGNOSIS — D696 Thrombocytopenia, unspecified: Secondary | ICD-10-CM

## 2018-08-22 DIAGNOSIS — G4730 Sleep apnea, unspecified: Secondary | ICD-10-CM | POA: Diagnosis not present

## 2018-08-22 DIAGNOSIS — N401 Enlarged prostate with lower urinary tract symptoms: Secondary | ICD-10-CM | POA: Diagnosis not present

## 2018-08-22 DIAGNOSIS — E785 Hyperlipidemia, unspecified: Secondary | ICD-10-CM | POA: Diagnosis not present

## 2018-08-22 DIAGNOSIS — J309 Allergic rhinitis, unspecified: Secondary | ICD-10-CM | POA: Diagnosis not present

## 2018-08-22 DIAGNOSIS — F329 Major depressive disorder, single episode, unspecified: Secondary | ICD-10-CM

## 2018-08-22 DIAGNOSIS — R351 Nocturia: Secondary | ICD-10-CM

## 2018-08-22 DIAGNOSIS — F32A Depression, unspecified: Secondary | ICD-10-CM

## 2018-08-22 DIAGNOSIS — I2510 Atherosclerotic heart disease of native coronary artery without angina pectoris: Secondary | ICD-10-CM

## 2018-08-22 LAB — COMPREHENSIVE METABOLIC PANEL
ALT: 89 U/L — ABNORMAL HIGH (ref 0–53)
AST: 32 U/L (ref 0–37)
Albumin: 4 g/dL (ref 3.5–5.2)
Alkaline Phosphatase: 297 U/L — ABNORMAL HIGH (ref 39–117)
BUN: 9 mg/dL (ref 6–23)
CO2: 30 mEq/L (ref 19–32)
Calcium: 9.4 mg/dL (ref 8.4–10.5)
Chloride: 106 mEq/L (ref 96–112)
Creatinine, Ser: 0.79 mg/dL (ref 0.40–1.50)
GFR: 93.7 mL/min (ref >60.00)
Glucose, Bld: 95 mg/dL (ref 70–99)
Potassium: 4.2 mEq/L (ref 3.5–5.1)
Sodium: 141 mEq/L (ref 135–145)
Total Bilirubin: 1.7 mg/dL — ABNORMAL HIGH (ref 0.2–1.2)
Total Protein: 6.1 g/dL (ref 6.0–8.3)

## 2018-08-22 LAB — CBC
HCT: 44.6 % (ref 39.0–52.0)
Hemoglobin: 14.6 g/dL (ref 13.0–17.0)
MCHC: 32.6 g/dL (ref 30.0–36.0)
MCV: 87.1 fl (ref 78.0–100.0)
Platelets: 291 10*3/uL (ref 150.0–400.0)
RBC: 5.12 Mil/uL (ref 4.22–5.81)
RDW: 16 % — ABNORMAL HIGH (ref 11.5–15.5)
WBC: 8.3 10*3/uL (ref 4.0–10.5)

## 2018-08-22 LAB — LIPID PANEL
Cholesterol: 176 mg/dL (ref 0–200)
HDL: 30.5 mg/dL — ABNORMAL LOW (ref >39.00)
LDL Cholesterol: 117 mg/dL — ABNORMAL HIGH (ref 0–99)
NonHDL: 145.34
Total CHOL/HDL Ratio: 6
Triglycerides: 144 mg/dL (ref 0.0–149.0)
VLDL: 28.8 mg/dL (ref 0.0–40.0)

## 2018-08-22 NOTE — Assessment & Plan Note (Signed)
Asymptomatic.  Continue BP control.  Repeat lipids pending.

## 2018-08-22 NOTE — Assessment & Plan Note (Signed)
Not wearing CPAP machine, cannot tolerate. Declines other forms of masks for CPAP.

## 2018-08-22 NOTE — Assessment & Plan Note (Signed)
Chronic, doing well on OTC Claritin. Continue same.

## 2018-08-22 NOTE — Assessment & Plan Note (Signed)
Compliant to monthly B12 injections. Recent B12 level stable, continue monthly injections. Repeat CBC pending.

## 2018-08-22 NOTE — Progress Notes (Signed)
Subjective:    Patient ID: [name], male    DOB: [birth year], 85 y.o.   MRN: [id number]  HPI  [name] is an 85 year old male who presents today for complete physical.  Immunizations: -Tetanus: Completed in 2013 -Influenza: Due this season  -Pneumonia: Completed -Shingles: Completed in 2009  Diet: He endorses a poor diet. He is eating fast food for breakfast, sandwich/TV dinner for lunch, snacks sometimes for dinner. He is drinking sweet tea/lemonade, no plain water. Little diet soda and juice.  Exercise: He is active, not exercising  Eye exam: Completed in 2019 Dental exam: Completes semi-annually  Colonoscopy: Declines given age PSA: 4.25 in 2019, follows with Urology   BP Readings from Last 3 Encounters:  08/22/18 120/66  09/06/17 122/62  07/07/17 120/60     Review of Systems  Constitutional: Negative for unexpected weight change.  HENT: Negative for rhinorrhea.   Respiratory: Negative for cough and shortness of breath.   Cardiovascular: Negative for chest pain.  Gastrointestinal: Negative for constipation and diarrhea.  Genitourinary: Negative for difficulty urinating.       Nocturia.  Musculoskeletal: Positive for arthralgias.       Chronic joint aches, generalized to shoulders and lower back  Skin: Negative for rash.  Allergic/Immunologic: Positive for environmental allergies.  Neurological: Negative for dizziness and headaches.  Psychiatric/Behavioral:       Some depression symptoms since Covid-19.       Past Medical History:  Diagnosis Date  . Arthralgia of multiple joints    limited mobility w/  independant adl's  . BPH with obstruction/lower urinary tract symptoms   . Chronic idiopathic monocytosis    followed by dr Waymon Budge--  per lov note 06/ 2017 persistant unexplained  with normal bone barrow bx  . Coronary atherosclerosis of unspecified type of vessel, native or graft    cardiologist-  dr Stanford Breed -- per lov note 11-19-2014 ,   cardiac cath in 2000-- pLAD 60-70%,  small intermediate branch 70-80%,  mRCA 20%,  normal LV  . Degenerative arthritis of spine    cervical and lumar  . Diverticulosis of colon (without mention of hemorrhage)   . Dysuria    chronic  . Elevated PSA   . Feeling of incomplete bladder emptying   . Hiatal hernia    moderate per ct 11/ 2017  . History of adenomatous polyp of colon    2008  . History of atrial fibrillation    remote hx episode  . History of pancreatitis    07-01-2013  and 10-10-2015  . History of prostatitis    2014  . Hx of colonic polyps   . Hyperlipidemia   . Hypertension   . Mouth sore    roof of mouth sore   . Nephrolithiasis   . Nocturia more than twice per night    severe  w/ leakage  . OSA (obstructive sleep apnea)    INTOLERANT CPAP  . Osteoarthritis   . Pernicious anemia    B12  Def.  . Peyronie disease   . Renal insufficiency   . Sepsis ([place]) 03/22/2017  . Thrombocytopenia, unspecified ([employer]) hemotology/oncologist-  dr Waymon Budge   per dr Velta Addison note 06/ 2016  secondary to vitro clumping     Social History   Socioeconomic History  . Marital status: Widowed    Spouse name: Not on file  . Number of children: 2  . Years of education: Not on file  . Highest education level:  Not on file  Occupational History  . Occupation: retired    Fish farm manager: RETIRED  Social Needs  . Financial resource strain: Not on file  . Food insecurity    Worry: Not on file    Inability: Not on file  . Transportation needs    Medical: Not on file    Non-medical: Not on file  Tobacco Use  . Smoking status: Former Smoker    Types: Pipe    Quit date: 01/18/1966    Years since quitting: 52.6  . Smokeless tobacco: Never Used  Substance and Sexual Activity  . Alcohol use: No    Alcohol/week: 0.0 standard drinks  . Drug use: No  . Sexual activity: Never  Lifestyle  . Physical activity    Days per week: Not on file    Minutes per session: Not on file  .  Stress: Not on file  Relationships  . Social Herbalist on phone: Not on file    Gets together: Not on file    Attends religious service: Not on file    Active member of club or organization: Not on file    Attends meetings of clubs or organizations: Not on file    Relationship status: Not on file  . Intimate partner violence    Fear of current or ex partner: Not on file    Emotionally abused: Not on file    Physically abused: Not on file    Forced sexual activity: Not on file  Other Topics Concern  . Not on file  Social History Narrative   DNR   Widowed   2 daughters leave near by    Past Surgical History:  Procedure Laterality Date  . BONE MARROW BIOPSY  2011   normal  . CARDIAC CATHETERIZATION  2000   per dr Kathyrn Drown note --  dLAD 60-70%,  small intermediate branch 70-80%,  mRCA 20%,  normal LV  . CARDIOVASCULAR STRESS TEST  01-29-2013   dr Stanford Breed   normal nuclear study w/ no ischemia,  normal LV function and wall motion , ef 82%  . CATARACT EXTRACTION W/ INTRAOCULAR LENS  IMPLANT, BILATERAL  08/2015  . CHOLECYSTECTOMY  11/24/2010   Procedure: LAPAROSCOPIC CHOLECYSTECTOMY WITH INTRAOPERATIVE CHOLANGIOGRAM;  Surgeon: Earnstine Regal, MD;  Location: [facility];  Service: General;  Laterality: N/A;  c-arm  . CYSTOSCOPY W/ URETERAL STENT PLACEMENT Left 12/17/2015   Procedure: CYSTOSCOPY WITH RETROGRADE PYELOGRAM/URETERAL STENT PLACEMENT;  Surgeon: Ardis Hughs, MD;  Location: [facility];  Service: Urology;  Laterality: Left;  . CYSTOSCOPY WITH RETROGRADE PYELOGRAM, URETEROSCOPY AND STENT PLACEMENT Left 12/22/2015   Procedure: CYSTOSCOPY WITH LEFT RETROGRADE  URETEROSCOPY AND STENT PLACEMENT;  Surgeon: Carolan Clines, MD;  Location: [facility];  Service: Urology;  Laterality: Left;  . ERCP N/A 07/04/2013   Procedure: ENDOSCOPIC RETROGRADE CHOLANGIOPANCREATOGRAPHY (ERCP);  Surgeon: Gatha Mayer, MD;  Location: [facility];  Service: Endoscopy;   Laterality: N/A;  MAC if available  . ERCP N/A 10/12/2015   Procedure: ENDOSCOPIC RETROGRADE CHOLANGIOPANCREATOGRAPHY (ERCP);  Surgeon: [name], MD;  Location: [facility];  Service: Endoscopy;  Laterality: N/A;  . HOLMIUM LASER APPLICATION Left 94/08/5460   Procedure: HOLMIUM LASER APPLICATION;  Surgeon: Carolan Clines, MD;  Location: [facility];  Service: Urology;  Laterality: Left;  . INGUINAL HERNIA REPAIR Right 01/25/2002  . KNEE ARTHROSCOPY  x5  . SATURATION BIOPSY OF PROSTATE  05-29-2007  and 01-26-2008  . SHOULDER ARTHROSCOPY WITH  OPEN ROTATOR CUFF REPAIR AND DISTAL CLAVICLE ACROMINECTOMY Right 11/19/2004  . TOTAL HIP ARTHROPLASTY Right 05/21/2009  . TOTAL KNEE ARTHROPLASTY Bilateral left 05-02-2003/  right  03-23-2010  . TRANSTHORACIC ECHOCARDIOGRAM  12/23/2004   ef 60%, mild MV calcification without stenosis/  mild TR,  PASP 71mHg  . UVULOPALATOPHARYNGOPLASTY  1993    w/  T & A    Family History  Problem Relation Age of Onset  . Colon cancer Father 43 . Peripheral vascular disease Mother     Allergies  Allergen Reactions  . Penicillins Hives and Other (See Comments)    [place], passed out Tolerates cephalosporins  Has patient had a PCN reaction causing immediate rash, facial/tongue/throat swelling, SOB or lightheadedness with hypotension:  yes Has patient had a PCN reaction causing severe rash involving mucus membranes or skin necrosis: no Has patient had a PCN reaction that required hospitalization: no Has patient had a PCN reaction occurring within the last 10 years: no If all of the above answers are "NO", then may proceed with Cephalosporin use.     Current Outpatient Medications on File Prior to Visit  Medication Sig Dispense Refill  . acetaminophen (TYLENOL) 325 MG tablet Take 650 mg by mouth every 6 (six) hours as needed for mild pain.    . Artificial Tear Solution (TEARS NATURALE OP) Place 1-2 drops into both eyes at bedtime as needed  (dry eyes).    . fluticasone (FLONASE) 50 MCG/ACT nasal spray Place 1 spray into both nostrils as needed for allergies or rhinitis.    . Loratadine (CLARITIN PO) Take 1 tablet by mouth daily.     . metoprolol succinate (TOPROL-XL) 25 MG 24 hr tablet Take 1 tablet (25 mg total) by mouth daily. 90 tablet 3  . Multiple Vitamin (MULTIVITAMIN) tablet Take 1 tablet by mouth daily.    . tamsulosin (FLOMAX) 0.4 MG CAPS capsule TAKE 1 CAPSULE BY MOUTH ONCE DAILY AFTERSUPPER FOR ENLARGED PROSTATE 90 capsule 0   No current facility-administered medications on file prior to visit.     BP 120/66   Pulse 76   Temp 98.4 F (36.9 C) (Temporal)   Ht 5' (1.524 m)   Wt 133 lb 8 oz (60.6 kg)   SpO2 97%   BMI 26.07 kg/m    Objective:   Physical Exam  Constitutional: He is oriented to person, place, and time. He appears well-nourished.  HENT:  Mouth/Throat: No oropharyngeal exudate.  Eyes: Pupils are equal, round, and reactive to light. EOM are normal.  Neck: Neck supple. No thyromegaly present.  Cardiovascular: Normal rate and regular rhythm.  Respiratory: Effort normal and breath sounds normal.  GI: Soft. Bowel sounds are normal. There is no abdominal tenderness.  Musculoskeletal:     Comments: Ambulates well without assistive device. Decrease in ROM to right and left shoulder, chronic  Neurological: He is alert and oriented to person, place, and time.  Skin: Skin is warm and dry.  Psychiatric: He has a normal mood and affect.           Assessment & Plan:

## 2018-08-22 NOTE — Assessment & Plan Note (Signed)
Immunizations UTD. Declines colonoscopy due to age, this is appropriate. PSA managed by Urology. Recommended healthy diet, regular exercise. Limit sweet drinks. Exam stable. Labs pending.

## 2018-08-22 NOTE — Assessment & Plan Note (Signed)
Intermittent symptoms of depression since Covid-19. Offered help in terms of therapy, medication for which he kindly declines. He will update if he decides to seek treatment.

## 2018-08-22 NOTE — Assessment & Plan Note (Signed)
Continued nocturia on Tamsulosin, improved urinary stream. Follows with Urology with recent visit being in July 2020. Continue current regimen.

## 2018-08-22 NOTE — Assessment & Plan Note (Signed)
Repeat lipid panel pending. Encouraged a healthy diet and exercise.

## 2018-08-22 NOTE — Patient Instructions (Signed)
Stop by the lab prior to leaving today. I will notify you of your results once received.   Start exercising. You should be getting 150 minutes of exercise weekly.  It's important to improve your diet by reducing consumption of fast food, fried food, processed snack foods, sugary drinks. Increase consumption of fresh vegetables and fruits, whole grains, water.  Ensure you are drinking 64 ounces of water daily.  Continue monthly B12 injections, please schedule nurse visits for these up front.  Schedule a lab only appointment for 6 months to repeat your B12 level.  It was a pleasure to see you today!

## 2018-08-22 NOTE — Assessment & Plan Note (Signed)
Repeat CBC pending. 

## 2018-08-30 ENCOUNTER — Other Ambulatory Visit: Payer: Self-pay | Admitting: Primary Care

## 2018-08-30 DIAGNOSIS — R748 Abnormal levels of other serum enzymes: Secondary | ICD-10-CM

## 2018-09-05 IMAGING — CT CT ABD-PELV W/ CM
2 of 5 series · 16 of 46 positions shown, 18 images · IV contrast (ISOVUE)
Comparison: CT abdomen pelvis dated [DATE]

CLINICAL DATA: Abdominal pain, vomiting, cough.

EXAM:
CT ABDOMEN AND PELVIS WITH CONTRAST
TECHNIQUE: Multidetector CT imaging of the abdomen and pelvis was performed
using the standard protocol following bolus administration of
intravenous contrast.
CONTRAST:  100mL [id number] IOPAMIDOL ([id number]) INJECTION 61%

[Series 2: abd/pel with · axial · 0.74mm/px · z∈[+983,+1338]mm · 13 of 83 slices shown, 15 images]
[im 6/83  soft-tissue]
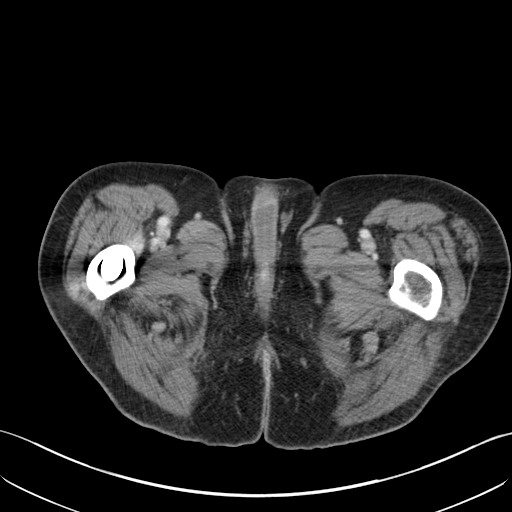
[im 6/83  bone]
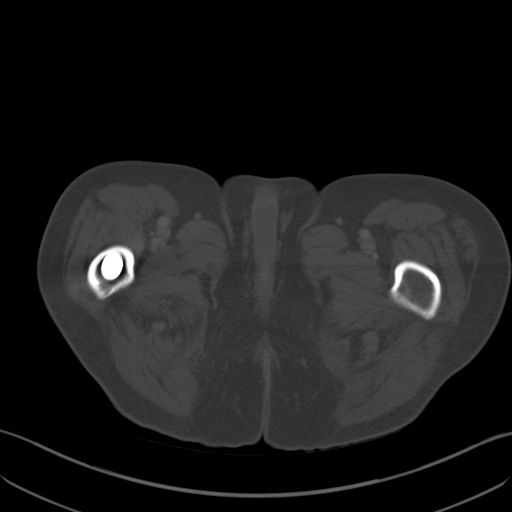
[im 11/83  soft-tissue]
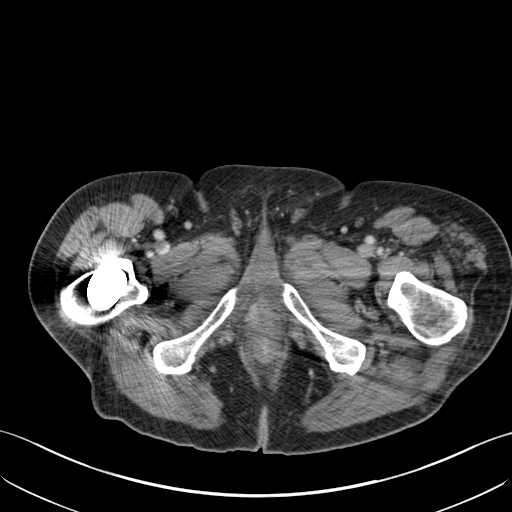
[im 16/83  soft-tissue]
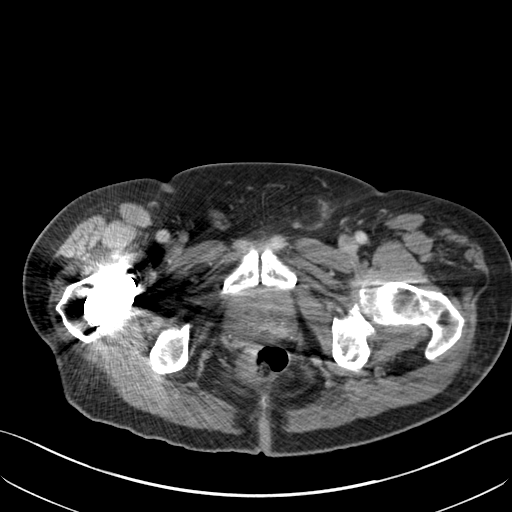
[im 26/83  soft-tissue]
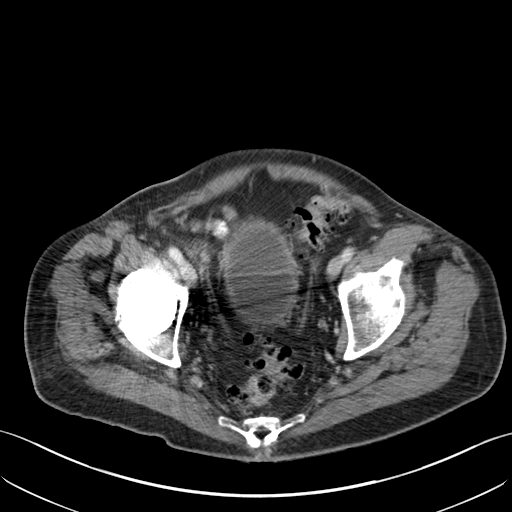
[im 31/83  soft-tissue]
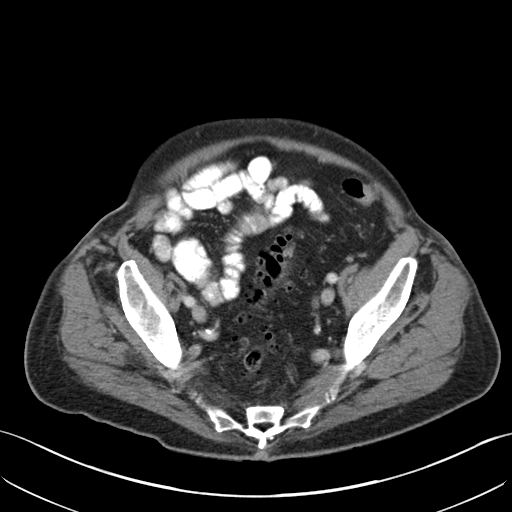
[im 36/83  soft-tissue]
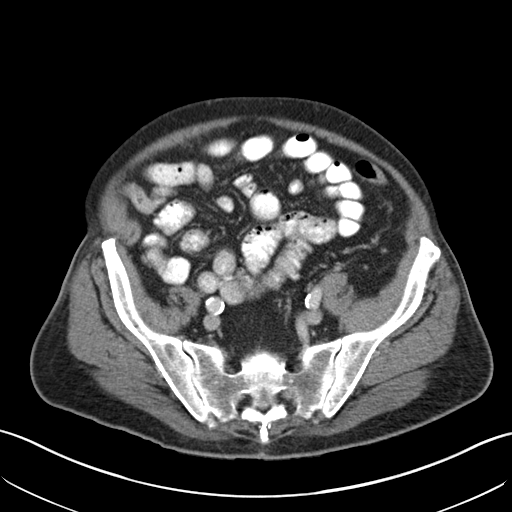
[im 42/83  soft-tissue]
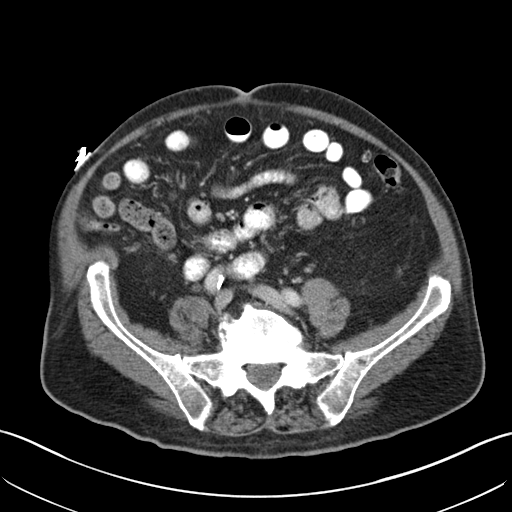
[im 47/83  soft-tissue]
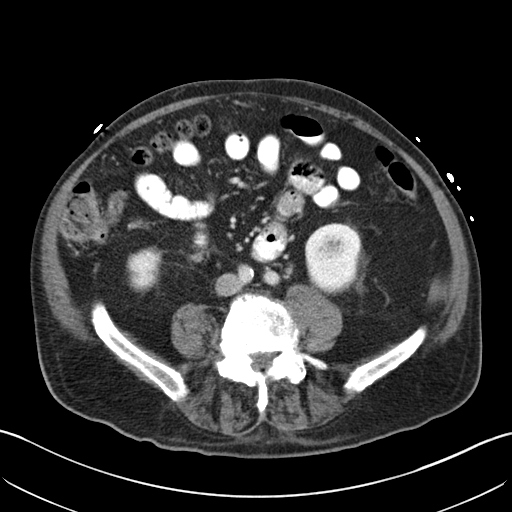
[im 52/83  soft-tissue]
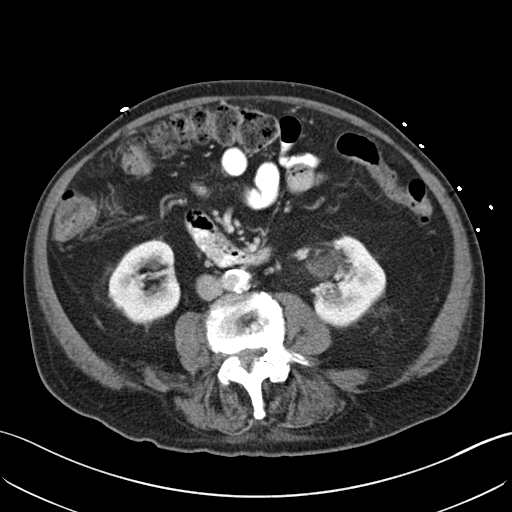
[im 52/83  bone]
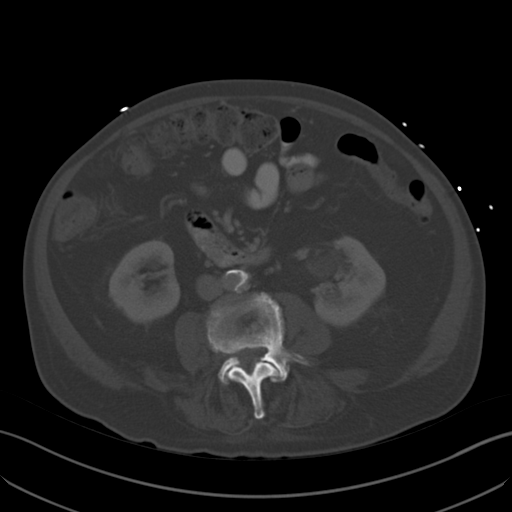
[im 57/83  soft-tissue]
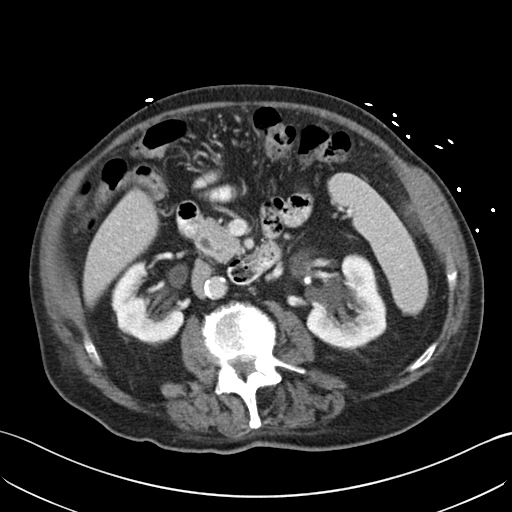
[im 67/83  soft-tissue]
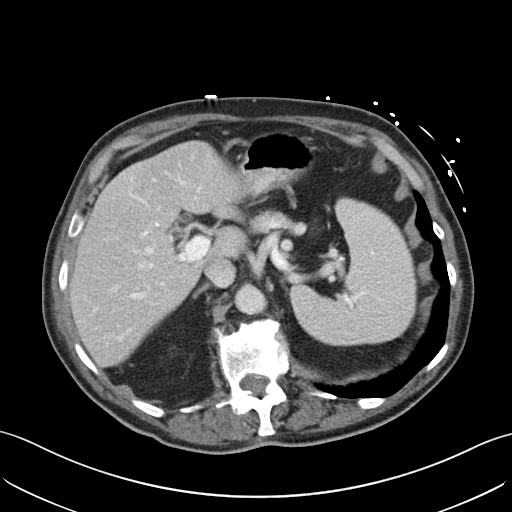
[im 72/83  soft-tissue]
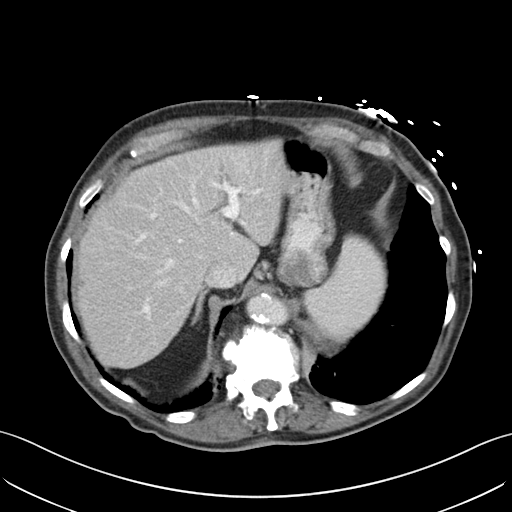
[im 77/83  soft-tissue]
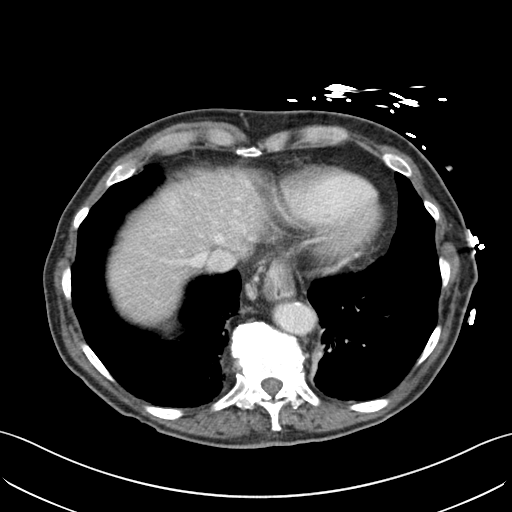

[Series 5: coronal a/|p · coronal · 0.71mm/px · 3 of 139 slices shown]
[im 47/139  soft-tissue]
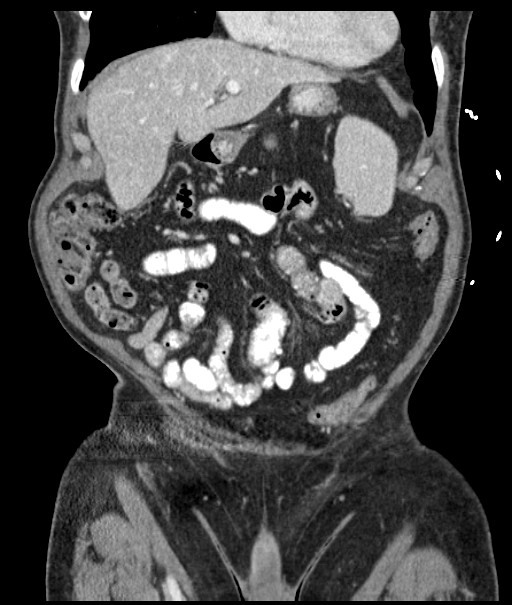
[im 62/139  soft-tissue]
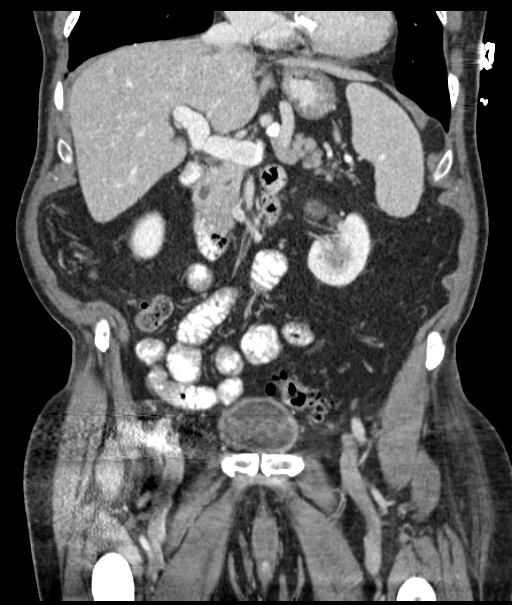
[im 77/139  soft-tissue]
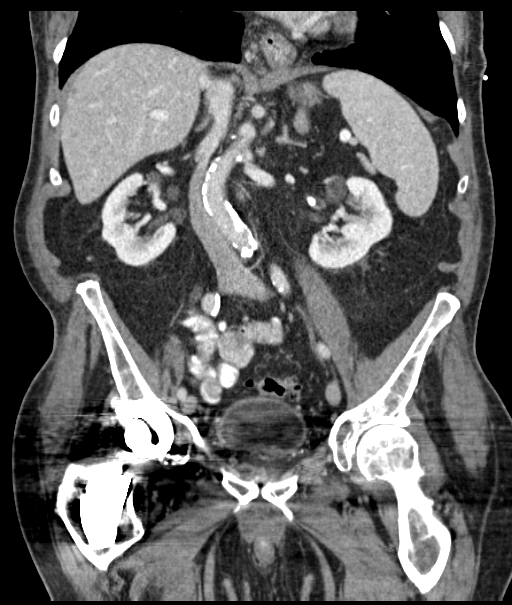

[16 of 46 positions shown; findings below may reference images not displayed]

FINDINGS: Lower chest: Lung bases are essentially clear.

Hepatobiliary: Liver is within normal limits.

Status post cholecystectomy. No intrahepatic or extrahepatic ductal
dilatation.

Pancreas: Within normal limits.

Spleen: Within normal limits.

Adrenals/Urinary Tract: Adrenal glands are within normal limits.

Kidneys are notable for bilateral renal sinus cysts. Mild urothelial
thickening/enhancement involving the left proximal collecting system
(series 2/ image 29). No frank hydronephrosis.

Mild wall thickening involving the bladder (series 2/image 60).

Stomach/Bowel: Stomach is notable for a tiny hiatal hernia.

No evidence of bowel obstruction.

Normal appendix (series 2/ image 43).

Extensive sigmoid diverticulosis, without evidence of
diverticulitis.

Vascular/Lymphatic: Atherosclerotic calcifications of the abdominal
aorta and branch vessels.

No evidence of abdominal aortic aneurysm.

No suspicious abdominopelvic lymphadenopathy.

Reproductive: Prostatomegaly, with enlargement the central gland
which indents the base of the bladder.

Other: No abdominopelvic ascites.

Small fat containing left inguinal hernia (series 2/image 67).

Musculoskeletal: Right hip arthroplasty, without evidence of
complication.

Moderate degenerative changes of the visualized thoracolumbar spine,
with associated lumbar dextroscoliosis.
IMPRESSION: No evidence of bowel obstruction.  Normal appendix.

Extensive sigmoid diverticulosis, without evidence of
diverticulitis.

Mildly thick-walled bladder, correlate for cystitis. Associated wall
thickening/enhancement involving the left proximal collecting
system, possibly reflecting ascending infection.

Prostatomegaly, suggesting BPH.

Additional ancillary findings as above.

## 2018-09-08 ENCOUNTER — Ambulatory Visit
Admission: RE | Admit: 2018-09-08 | Discharge: 2018-09-08 | Disposition: A | Discharge status: Home / Self Care | Payer: Medicare Other | Source: Ambulatory Visit | Attending: Primary Care | Admitting: Primary Care

## 2018-09-08 ENCOUNTER — Other Ambulatory Visit: Payer: Self-pay

## 2018-09-08 DIAGNOSIS — R748 Abnormal levels of other serum enzymes: Secondary | ICD-10-CM

## 2018-09-08 DIAGNOSIS — K760 Fatty (change of) liver, not elsewhere classified: Secondary | ICD-10-CM | POA: Diagnosis not present

## 2018-09-13 ENCOUNTER — Telehealth: Payer: Self-pay | Admitting: *Deleted

## 2018-09-13 ENCOUNTER — Ambulatory Visit: Payer: Medicare Other

## 2018-09-13 NOTE — Telephone Encounter (Signed)
Called patient to confirm his nurse visit and perform a covid screening which was negative. Patient stated that he would like the results of the recent US abdomen he had done. Advised patient that a detailed message was left for him.  Patient stated that he never got a message. Results were read to the patient by telephone and he verbalized understanding.

## 2018-09-14 ENCOUNTER — Ambulatory Visit (INDEPENDENT_AMBULATORY_CARE_PROVIDER_SITE_OTHER): Payer: Medicare Other

## 2018-09-14 DIAGNOSIS — E538 Deficiency of other specified B group vitamins: Secondary | ICD-10-CM

## 2018-09-14 MED ORDER — CYANOCOBALAMIN 1000 MCG/ML IJ SOLN
1000.0000 ug | Freq: Once | INTRAMUSCULAR | Status: AC
  Administered 2018-09-14: 15:00:00 1000 ug via INTRAMUSCULAR

## 2018-09-14 NOTE — Progress Notes (Signed)
Per orders of Katherine Clark, NP, injection of vit B12 given by Julionna Marczak. Patient tolerated injection well.  

## 2018-10-09 ENCOUNTER — Other Ambulatory Visit: Payer: Self-pay | Admitting: Primary Care

## 2018-10-09 DIAGNOSIS — N401 Enlarged prostate with lower urinary tract symptoms: Secondary | ICD-10-CM

## 2018-10-09 DIAGNOSIS — R351 Nocturia: Secondary | ICD-10-CM

## 2018-10-13 ENCOUNTER — Other Ambulatory Visit: Payer: Self-pay

## 2018-10-13 DIAGNOSIS — R6889 Other general symptoms and signs: Secondary | ICD-10-CM | POA: Diagnosis not present

## 2018-10-13 DIAGNOSIS — Z20822 Contact with and (suspected) exposure to covid-19: Secondary | ICD-10-CM

## 2018-10-14 LAB — NOVEL CORONAVIRUS, NAA: SARS-CoV-2, NAA: NOT DETECTED

## 2018-10-30 ENCOUNTER — Emergency Department
Admission: EM | Admit: 2018-10-30 | Discharge: 2018-10-30 | Disposition: A | Discharge status: Home / Self Care | Payer: Medicare Other | Attending: Emergency Medicine | Admitting: Emergency Medicine

## 2018-10-30 ENCOUNTER — Other Ambulatory Visit: Payer: Self-pay

## 2018-10-30 ENCOUNTER — Encounter: Payer: Self-pay | Admitting: Emergency Medicine

## 2018-10-30 DIAGNOSIS — Z79899 Other long term (current) drug therapy: Secondary | ICD-10-CM | POA: Diagnosis not present

## 2018-10-30 DIAGNOSIS — Y92015 Private garage of single-family (private) house as the place of occurrence of the external cause: Secondary | ICD-10-CM | POA: Diagnosis not present

## 2018-10-30 DIAGNOSIS — Y9389 Activity, other specified: Secondary | ICD-10-CM | POA: Insufficient documentation

## 2018-10-30 DIAGNOSIS — Z87891 Personal history of nicotine dependence: Secondary | ICD-10-CM | POA: Insufficient documentation

## 2018-10-30 DIAGNOSIS — Z960 Presence of urogenital implants: Secondary | ICD-10-CM | POA: Diagnosis not present

## 2018-10-30 DIAGNOSIS — I10 Essential (primary) hypertension: Secondary | ICD-10-CM | POA: Diagnosis not present

## 2018-10-30 DIAGNOSIS — Y29XXXA Contact with blunt object, undetermined intent, initial encounter: Secondary | ICD-10-CM | POA: Insufficient documentation

## 2018-10-30 DIAGNOSIS — S0990XA Unspecified injury of head, initial encounter: Secondary | ICD-10-CM | POA: Diagnosis present

## 2018-10-30 DIAGNOSIS — Y999 Unspecified external cause status: Secondary | ICD-10-CM | POA: Diagnosis not present

## 2018-10-30 DIAGNOSIS — Z96641 Presence of right artificial hip joint: Secondary | ICD-10-CM | POA: Insufficient documentation

## 2018-10-30 DIAGNOSIS — S0001XA Abrasion of scalp, initial encounter: Secondary | ICD-10-CM | POA: Diagnosis not present

## 2018-10-30 DIAGNOSIS — Z96653 Presence of artificial knee joint, bilateral: Secondary | ICD-10-CM | POA: Diagnosis not present

## 2018-10-30 MED ORDER — LIDOCAINE-EPINEPHRINE-TETRACAINE (LET) TOPICAL GEL
3.0000 mL | Freq: Once | TOPICAL | Status: AC
  Administered 2018-10-30: 3 mL via TOPICAL
  Filled 2018-10-30: qty 3

## 2018-10-30 NOTE — ED Notes (Signed)
See triage note  States was working on car   [place] back and hit head on bumper  Denies any LOC

## 2018-10-30 NOTE — Discharge Instructions (Signed)
You have a normal exam following your trip and fall. You have a superficial abrasion to the scalp. Keep the area clean and covered, if needed. We applied a topical gel to reduce pain and bleeding. Follow-up with your provider as needed.

## 2018-10-30 NOTE — ED Provider Notes (Signed)
[facility] Emergency Department Provider Note ____________________________________________  Time seen: 1631  I have reviewed the triage vital signs and the nursing notes.  HISTORY  Chief Complaint  Laceration  HPI [name] is a 85 y.o. male presents himself to the ED from home, for evaluation of a laceration to the scalp.  Patient was under his car on his creeper, when he rolled out to stand up, he apparently slipped and fell back hitting the top of his scalp on the bumper of the car.  He sustained a laceration, and noted bleeding immediately.  He applied paper towels to the area, and drove himself to the ED for further evaluation.  He denies any nausea, vomiting, or dizziness.  Also denies any loss of consciousness, weakness, or visual disturbance.  He presents now for further evaluation of a scalp laceration.  Patient takes no blood thinners, and denies any other injury at this time.  Past Medical History:  Diagnosis Date  . Arthralgia of multiple joints    limited mobility w/  independant adl's  . BPH with obstruction/lower urinary tract symptoms   . Chronic idiopathic monocytosis    followed by dr Waymon Budge--  per lov note 06/ 2017 persistant unexplained  with normal bone barrow bx  . Coronary atherosclerosis of unspecified type of vessel, native or graft    cardiologist-  dr Stanford Breed -- per lov note 11-19-2014 ,  cardiac cath in 2000-- pLAD 60-70%,  small intermediate branch 70-80%,  mRCA 20%,  normal LV  . Degenerative arthritis of spine    cervical and lumar  . Diverticulosis of colon (without mention of hemorrhage)   . Dysuria    chronic  . Elevated PSA   . Feeling of incomplete bladder emptying   . Hiatal hernia    moderate per ct 11/ 2017  . History of adenomatous polyp of colon    2008  . History of atrial fibrillation    remote hx episode  . History of pancreatitis    07-01-2013  and 10-10-2015  . History of prostatitis    2014  .  Hx of colonic polyps   . Hyperlipidemia   . Hypertension   . Mouth sore    roof of mouth sore   . Nephrolithiasis   . Nocturia more than twice per night    severe  w/ leakage  . OSA (obstructive sleep apnea)    INTOLERANT CPAP  . Osteoarthritis   . Pernicious anemia    B12  Def.  . Peyronie disease   . Renal insufficiency   . Sepsis ([place]) 03/22/2017  . Thrombocytopenia, unspecified ([place]) hemotology/oncologist-  dr Waymon Budge   per dr Velta Addison note 06/ 2016  secondary to vitro clumping    Patient Active Problem List   Diagnosis Date Noted  . Change in vision 09/07/2017  . Allergic rhinitis 07/07/2017  . Preventative health care 07/06/2016  . Dysphagia 07/06/2016  . BPH (benign prostatic hyperplasia) 12/22/2015  . Depression 03/12/2015  . Skin rash 04/15/2014  . Choledocholithiasis with obstruction 07/02/2013  . Thrombocytopenia ([place]) 09/01/2012  . DNR (do not resuscitate) 06/06/2012  . ANEMIA, PERNICIOUS 01/03/2009  . Coronary atherosclerosis 12/12/2007  . HLD (hyperlipidemia) 06/14/2006  . Essential hypertension 06/14/2006  . DIVERTICULOSIS, COLON 06/14/2006  . Elevated PSA, less than 10 ng/ml 06/14/2006  . DEGENERATIVE DISC DISEASE 06/14/2006  . Sleep apnea 06/14/2006    Past Surgical History:  Procedure Laterality Date  . BONE MARROW BIOPSY  2011  normal  . CARDIAC CATHETERIZATION  2000   per dr Kathyrn Drown note --  dLAD 60-70%,  small intermediate branch 70-80%,  mRCA 20%,  normal LV  . CARDIOVASCULAR STRESS TEST  01-29-2013   dr Stanford Breed   normal nuclear study w/ no ischemia,  normal LV function and wall motion , ef 82%  . CATARACT EXTRACTION W/ INTRAOCULAR LENS  IMPLANT, BILATERAL  08/2015  . CHOLECYSTECTOMY  11/24/2010   Procedure: LAPAROSCOPIC CHOLECYSTECTOMY WITH INTRAOPERATIVE CHOLANGIOGRAM;  Surgeon: Earnstine Regal, MD;  Location: [facility];  Service: General;  Laterality: N/A;  c-arm  . CYSTOSCOPY W/ URETERAL STENT PLACEMENT Left 12/17/2015    Procedure: CYSTOSCOPY WITH RETROGRADE PYELOGRAM/URETERAL STENT PLACEMENT;  Surgeon: Ardis Hughs, MD;  Location: [facility];  Service: Urology;  Laterality: Left;  . CYSTOSCOPY WITH RETROGRADE PYELOGRAM, URETEROSCOPY AND STENT PLACEMENT Left 12/22/2015   Procedure: CYSTOSCOPY WITH LEFT RETROGRADE  URETEROSCOPY AND STENT PLACEMENT;  Surgeon: Carolan Clines, MD;  Location: [facility];  Service: Urology;  Laterality: Left;  . ERCP N/A 07/04/2013   Procedure: ENDOSCOPIC RETROGRADE CHOLANGIOPANCREATOGRAPHY (ERCP);  Surgeon: Gatha Mayer, MD;  Location: [facility];  Service: Endoscopy;  Laterality: N/A;  MAC if available  . ERCP N/A 10/12/2015   Procedure: ENDOSCOPIC RETROGRADE CHOLANGIOPANCREATOGRAPHY (ERCP);  Surgeon: [name], MD;  Location: [facility];  Service: Endoscopy;  Laterality: N/A;  . HOLMIUM LASER APPLICATION Left 38/01/8297   Procedure: HOLMIUM LASER APPLICATION;  Surgeon: Carolan Clines, MD;  Location: [facility];  Service: Urology;  Laterality: Left;  . INGUINAL HERNIA REPAIR Right 01/25/2002  . KNEE ARTHROSCOPY  x5  . SATURATION BIOPSY OF PROSTATE  05-29-2007  and 01-26-2008  . SHOULDER ARTHROSCOPY WITH OPEN ROTATOR CUFF REPAIR AND DISTAL CLAVICLE ACROMINECTOMY Right 11/19/2004  . TOTAL HIP ARTHROPLASTY Right 05/21/2009  . TOTAL KNEE ARTHROPLASTY Bilateral left 05-02-2003/  right  03-23-2010  . TRANSTHORACIC ECHOCARDIOGRAM  12/23/2004   ef 60%, mild MV calcification without stenosis/  mild TR,  PASP 65mHg  . UVULOPALATOPHARYNGOPLASTY  1993    w/  T & A    Prior to Admission medications   Medication Sig Start Date End Date Taking? Authorizing Provider  acetaminophen (TYLENOL) 325 MG tablet Take 650 mg by mouth every 6 (six) hours as needed for mild pain.    [provider]  Artificial Tear Solution (TEARS NATURALE OP) Place 1-2 drops into both eyes at bedtime as needed (dry eyes).    [provider]  fluticasone  (FLONASE) 50 MCG/ACT nasal spray Place 1 spray into both nostrils as needed for allergies or rhinitis.    [provider]  Loratadine (CLARITIN PO) Take 1 tablet by mouth daily.     [provider]  metoprolol succinate (TOPROL-XL) 25 MG 24 hr tablet Take 1 tablet (25 mg total) by mouth daily. 10/07/16   MAlmyra Deforest PA  Multiple Vitamin (MULTIVITAMIN) tablet Take 1 tablet by mouth daily.    [provider]  tamsulosin (FLOMAX) 0.4 MG CAPS capsule TAKE 1 CAPSULE BY MOUTH ONCE DAILY AFTERSUPPER FOR ENLARGED PROSTATE 10/09/18   CPleas Koch NP    Allergies Penicillins  Family History  Problem Relation Age of Onset  . Colon cancer Father 485 . Peripheral vascular disease Mother     Social History Social History   Tobacco Use  . Smoking status: Former Smoker    Types: Pipe    Quit date: 01/18/1966    Years since quitting: 52.8  .  Smokeless tobacco: Never Used  Substance Use Topics  . Alcohol use: No    Alcohol/week: 0.0 standard drinks  . Drug use: No    Review of Systems  Constitutional: Negative for fever. Eyes: Negative for visual changes. ENT: Negative for sore throat. Cardiovascular: Negative for chest pain. Respiratory: Negative for shortness of breath. Gastrointestinal: Negative for abdominal pain, vomiting and diarrhea. Genitourinary: Negative for dysuria. Musculoskeletal: Negative for back pain. Skin: Negative for rash.  Scalp laceration as above. Neurological: Negative for headaches, focal weakness or numbness. ____________________________________________  PHYSICAL EXAM:  VITAL SIGNS: ED Triage Vitals  Enc Vitals Group     BP 10/30/18 1544 133/70     Pulse Rate 10/30/18 1544 78     Resp 10/30/18 1544 18     Temp 10/30/18 1544 98.4 F (36.9 C)     Temp Source 10/30/18 1544 Oral     SpO2 10/30/18 1544 97 %     Weight 10/30/18 1531 133 lb 9.6 oz (60.6 kg)     Height 10/30/18 1531 5' (1.524 m)     Head Circumference --       Peak Flow --      Pain Score 10/30/18 1531 8     Pain Loc --      Pain Edu? --      Excl. in [place]? --     Constitutional: Alert and oriented. Well appearing and in no distress.  GCS = 15 Head: Normocephalic and atraumatic, except a superficial abrasion to the crown of the scalp. No active bleeding noted.  Eyes: Conjunctivae are normal. PERRL. Normal extraocular movements Ears: Canals clear. TMs intact bilaterally. Neck: Supple. Normal ROM without crepitus Cardiovascular: Normal rate, regular rhythm. Normal distal pulses. Respiratory: Normal respiratory effort. No wheezes/rales/rhonchi. Musculoskeletal: Nontender with normal range of motion in all extremities.  Neurologic:  Normal gait without ataxia. Normal speech and language. No gross focal neurologic deficits are appreciated. Skin:  Skin is warm, dry and intact. No rash noted. ____________________________________________  PROCEDURES  LET applied Wound cleansed - no dressing Procedures ____________________________________________  INITIAL IMPRESSION / ASSESSMENT AND PLAN / ED COURSE  Geriatric patient ED evaluation of injury sustained following a mechanical fall.  Patient sustained an abrasion to the top of the scalp, but no loss of consciousness.  He presents himself to the ED for evaluation over a scalp laceration.  He has a superficial abrasion without any need for intervention.  The area was cleaned and hemostasis was achieved using topical LET.  He is discharged to his own care to follow-up with his provider as needed.  Return precautions have been reviewed.  Abram Sax Levengood was evaluated in Emergency Department on 10/30/2018 for the symptoms described in the history of present illness. He was evaluated in the context of the global COVID-19 pandemic, which necessitated consideration that the patient might be at risk for infection with the SARS-CoV-2 virus that causes COVID-19. Institutional protocols and algorithms that pertain to  the evaluation of patients at risk for COVID-19 are in a state of rapid change based on information released by regulatory bodies including the CDC and federal and state organizations. These policies and algorithms were followed during the patient's care in the ED. ___________________________________________  FINAL CLINICAL IMPRESSION(S) / ED DIAGNOSES  Final diagnoses:  Abrasion of scalp, initial encounter      Melvenia Needles, PA-C 10/30/18 2208    Nance Pear, MD 10/30/18 2251

## 2018-10-30 NOTE — ED Triage Notes (Signed)
States fell and hit head on car bumper while working on car today.  No LOC.

## 2018-11-07 ENCOUNTER — Telehealth: Payer: Self-pay | Admitting: *Deleted

## 2018-11-07 NOTE — Telephone Encounter (Addendum)
Patient called stating that he went to the ER last week because he had fallen and hit his head. Patient stated that he has had dizzy spells off and on for about 6 months. Patient stated that they seem to be getting worse. Patient checked his blood pressure while on the phone and it was 137/64 and pulse 72. Patient stated that he does work a part time job and does go out, but always wears a mask. Patient stated that he has been tested for covid twice and both times he was negative. Patient stated that he has had a cough that has gotten worse in the last two days and has had some sinus congestion, like a sinus infection. Patent stated that the cough is especially worse at night. Patient stated that he has taken some mucinex for a few days and that seemed to have helped the congestion some, so he stopped the mucinex. Patient thinks that he needs to see his PCP, but not sure if he should come in the office with his symptoms? Patient was advised that he should not drive with having dizzy spells and he verbalized understanding. ER precautions given to patient.

## 2018-11-07 NOTE — Telephone Encounter (Signed)
Patient notified as instructed by telephone and verbalized understanding. Patient stated that he thinks some of his problems with the cough is because he had his tonsils removed several years back and they removed his uvula. Patent stated that he thinks his sinus problems is because he mowed his grass yesterday. Patient stated that now he has taken his allergy pill his symptoms have improved. Offered patient a virtual visit tomorrow which he declined stating that he will be at his part time job tomorrow. Virtual visit scheduled for Thursday 11/09/18 at 9:40.

## 2018-11-07 NOTE — Telephone Encounter (Signed)
He cannot come into the office with symptoms of a cough, we can try to evaluate him virtually as long as he doesn't have fevers or other symptoms. If fevers, increased dyspnea, increased cough, increased fatigue then needs UC evaluation with chest xray to rule out pneumonia.   He does need an office visit for his chronic dizziness, please have him schedule this once his cough symptoms have resolved. Again I am also happy to evaluate him virtually for cough, see above for conditions.

## 2018-11-07 NOTE — Telephone Encounter (Signed)
Noted, will evaluate. 

## 2018-11-09 ENCOUNTER — Encounter: Payer: Self-pay | Admitting: Primary Care

## 2018-11-09 ENCOUNTER — Ambulatory Visit (INDEPENDENT_AMBULATORY_CARE_PROVIDER_SITE_OTHER): Payer: Medicare Other | Admitting: Primary Care

## 2018-11-09 VITALS — BP 137/64 | HR 72 | Temp 97.1°F

## 2018-11-09 DIAGNOSIS — R42 Dizziness and giddiness: Secondary | ICD-10-CM | POA: Diagnosis not present

## 2018-11-09 DIAGNOSIS — D510 Vitamin B12 deficiency anemia due to intrinsic factor deficiency: Secondary | ICD-10-CM

## 2018-11-09 NOTE — Assessment & Plan Note (Addendum)
Acute since fall on 10/30/18. Evaluated in the ED, no CT head completed.  Dizziness has improved, seems like he could be experiencing orthostatic dizziness. Vitals today stable per home readings.  Discussed to increase water intake, rise slowing from seated/lying positions, use cane. He has no dizziness when ambulating around town.   Check labs today including CBC, B12, and BMP. Given that his symptoms have improved we gave him strict ED/return precautions. He agrees with plan.

## 2018-11-09 NOTE — Progress Notes (Signed)
Subjective:    Patient ID: [name], male    DOB: 1933/12/17, 85 y.o.   MRN: [id number]  HPI     [name] - 85 y.o. male  MRN [id number]  Date of Birth: 1933/08/12  PCP: Pleas Koch, NP  This service was provided via telemedicine. Phone Visit performed on 11/09/2018    Rationale for phone visit along with limitations reviewed. Patient consented to telephone encounter.    Location of patient: Home Location of provider: Office at [employer] @ [facility] Name of referring provider: N/A   Names of persons and role in encounter: Provider: Pleas Koch, NP  Patient: [name]  Other: N/A   Time on call: 14 min - 46 sec   Subjective: Chief Complaint  Patient presents with  . Cough  . Fatigue  . Dizziness     HPI:  [name] is a 85 year old male with a history of hypertension, sleep apnea, allergic rhinitis who presents today with a chief complaint of cough.  About one week ago he sustained a fall while working on his car. He was able to raise up, take a few steps, then fell backwards on his head hitting the car bumper. He presented to [facility] ED on 10/30/18 (same day) for evaluation, no CT head completed, just routine wound care. He was discharged later that day.  Symptoms of feeling "off balance" began on 10/30/18 after hitting his head on the car bumper just after his fall. Since then he's noticed feeling off balance when first raising from a seated position then will notice improvement. He denies dizziness with ambulation. He's been out to the grocery store, also went to vote this morning and had no symptoms.   Last B12 injection was in August 2020. He does have a cane, doesn't use it. He denies LOC, presyncope, acute visual changes, headaches.   Typical diet includes Ensure and fast food for breakfast, sandwich for lunch, meat/starch/vegetable for dinner. Ice cream bars daily. He is drinking Ensure, sweet tea, very little water.    Objective/Observations:  No physical exam or vital signs collected unless specifically identified below.   There were no vitals taken for this visit.   Respiratory status: speaks in complete sentences without evident shortness of breath.   Assessment/Plan:  See problem based charting.  No problem-specific Assessment & Plan notes found for this encounter.   I discussed the assessment and treatment plan with the patient. The patient was provided an opportunity to ask questions and all were answered. The patient agreed with the plan and demonstrated an understanding of the instructions.  Lab Orders  No laboratory test(s) ordered today    No orders of the defined types were placed in this encounter.   The patient was advised to call back or seek an in-person evaluation if the symptoms worsen or if the condition fails to improve as anticipated.  Pleas Koch, NP    Review of Systems  Constitutional: Negative for fever.  Respiratory: Negative for shortness of breath.   Cardiovascular: Negative for chest pain.  Neurological: Positive for dizziness. Negative for speech difficulty, weakness and headaches.       Past Medical History:  Diagnosis Date  . Arthralgia of multiple joints    limited mobility w/  independant adl's  . BPH with obstruction/lower urinary tract symptoms   . Chronic idiopathic monocytosis    followed by dr Waymon Budge--  per lov note 06/ 2017 persistant unexplained  with normal bone barrow bx  . Coronary atherosclerosis of unspecified type of vessel, native or graft    cardiologist-  dr Stanford Breed -- per lov note 11-19-2014 ,  cardiac cath in 2000-- pLAD 60-70%,  small intermediate branch 70-80%,  mRCA 20%,  normal LV  . Degenerative arthritis of spine    cervical and lumar  . Diverticulosis of colon (without mention of hemorrhage)   . Dysuria    chronic  . Elevated PSA   . Feeling of incomplete bladder emptying   . Hiatal hernia    moderate per  ct 11/ 2017  . History of adenomatous polyp of colon    2008  . History of atrial fibrillation    remote hx episode  . History of pancreatitis    07-01-2013  and 10-10-2015  . History of prostatitis    2014  . Hx of colonic polyps   . Hyperlipidemia   . Hypertension   . Mouth sore    roof of mouth sore   . Nephrolithiasis   . Nocturia more than twice per night    severe  w/ leakage  . OSA (obstructive sleep apnea)    INTOLERANT CPAP  . Osteoarthritis   . Pernicious anemia    B12  Def.  . Peyronie disease   . Renal insufficiency   . Sepsis ([place]) 03/22/2017  . Thrombocytopenia, unspecified ([place]) hemotology/oncologist-  dr Waymon Budge   per dr Velta Addison note 06/ 2016  secondary to vitro clumping     Social History   Socioeconomic History  . Marital status: Widowed    Spouse name: Not on file  . Number of children: 2  . Years of education: Not on file  . Highest education level: Not on file  Occupational History  . Occupation: retired    Fish farm manager: RETIRED  Social Needs  . Financial resource strain: Not on file  . Food insecurity    Worry: Not on file    Inability: Not on file  . Transportation needs    Medical: Not on file    Non-medical: Not on file  Tobacco Use  . Smoking status: Former Smoker    Types: Pipe    Quit date: 01/18/1966    Years since quitting: 52.8  . Smokeless tobacco: Never Used  Substance and Sexual Activity  . Alcohol use: No    Alcohol/week: 0.0 standard drinks  . Drug use: No  . Sexual activity: Never  Lifestyle  . Physical activity    Days per week: Not on file    Minutes per session: Not on file  . Stress: Not on file  Relationships  . Social Herbalist on phone: Not on file    Gets together: Not on file    Attends religious service: Not on file    Active member of club or organization: Not on file    Attends meetings of clubs or organizations: Not on file    Relationship status: Not on file  . Intimate partner  violence    Fear of current or ex partner: Not on file    Emotionally abused: Not on file    Physically abused: Not on file    Forced sexual activity: Not on file  Other Topics Concern  . Not on file  Social History Narrative   DNR   Widowed   2 daughters leave near by    Past Surgical History:  Procedure Laterality Date  . BONE MARROW BIOPSY  2011   normal  . CARDIAC CATHETERIZATION  2000   per dr Kathyrn Drown note --  dLAD 60-70%,  small intermediate branch 70-80%,  mRCA 20%,  normal LV  . CARDIOVASCULAR STRESS TEST  01-29-2013   dr Stanford Breed   normal nuclear study w/ no ischemia,  normal LV function and wall motion , ef 82%  . CATARACT EXTRACTION W/ INTRAOCULAR LENS  IMPLANT, BILATERAL  08/2015  . CHOLECYSTECTOMY  11/24/2010   Procedure: LAPAROSCOPIC CHOLECYSTECTOMY WITH INTRAOPERATIVE CHOLANGIOGRAM;  Surgeon: Earnstine Regal, MD;  Location: [facility];  Service: General;  Laterality: N/A;  c-arm  . CYSTOSCOPY W/ URETERAL STENT PLACEMENT Left 12/17/2015   Procedure: CYSTOSCOPY WITH RETROGRADE PYELOGRAM/URETERAL STENT PLACEMENT;  Surgeon: Ardis Hughs, MD;  [facility]: [facility];  Service: Urology;  Laterality: Left;  . CYSTOSCOPY WITH RETROGRADE PYELOGRAM, URETEROSCOPY AND STENT PLACEMENT Left 12/22/2015   Procedure: CYSTOSCOPY WITH LEFT RETROGRADE  URETEROSCOPY AND STENT PLACEMENT;  Surgeon: Carolan Clines, MD;  Location: [facility];  Service: Urology;  Laterality: Left;  . ERCP N/A 07/04/2013   Procedure: ENDOSCOPIC RETROGRADE CHOLANGIOPANCREATOGRAPHY (ERCP);  Surgeon: Gatha Mayer, MD;  Location: [facility];  Service: Endoscopy;  Laterality: N/A;  MAC if available  . ERCP N/A 10/12/2015   Procedure: ENDOSCOPIC RETROGRADE CHOLANGIOPANCREATOGRAPHY (ERCP);  Surgeon: [name], MD;  [facility]: [facility];  Service: Endoscopy;  Laterality: N/A;  . HOLMIUM LASER APPLICATION Left 01/24/5100   Procedure: HOLMIUM LASER APPLICATION;  Surgeon: Carolan Clines, MD;   Location: [facility];  Service: Urology;  Laterality: Left;  . INGUINAL HERNIA REPAIR Right 01/25/2002  . KNEE ARTHROSCOPY  x5  . SATURATION BIOPSY OF PROSTATE  05-29-2007  and 01-26-2008  . SHOULDER ARTHROSCOPY WITH OPEN ROTATOR CUFF REPAIR AND DISTAL CLAVICLE ACROMINECTOMY Right 11/19/2004  . TOTAL HIP ARTHROPLASTY Right 05/21/2009  . TOTAL KNEE ARTHROPLASTY Bilateral left 05-02-2003/  right  03-23-2010  . TRANSTHORACIC ECHOCARDIOGRAM  12/23/2004   ef 60%, mild MV calcification without stenosis/  mild TR,  PASP 45mHg  . UVULOPALATOPHARYNGOPLASTY  1993    w/  T & A    Family History  Problem Relation Age of Onset  . Colon cancer Father 468 . Peripheral vascular disease Mother     Allergies  Allergen Reactions  . Penicillins Hives and Other (See Comments)    [place], passed out Tolerates cephalosporins  Has patient had a PCN reaction causing immediate rash, facial/tongue/throat swelling, SOB or lightheadedness with hypotension:  yes Has patient had a PCN reaction causing severe rash involving mucus membranes or skin necrosis: no Has patient had a PCN reaction that required hospitalization: no Has patient had a PCN reaction occurring within the last 10 years: no If all of the above answers are "NO", then may proceed with Cephalosporin use.     Current Outpatient Medications on File Prior to Visit  Medication Sig Dispense Refill  . acetaminophen (TYLENOL) 325 MG tablet Take 650 mg by mouth every 6 (six) hours as needed for mild pain.    . Artificial Tear Solution (TEARS NATURALE OP) Place 1-2 drops into both eyes at bedtime as needed (dry eyes).    . fluticasone (FLONASE) 50 MCG/ACT nasal spray Place 1 spray into both nostrils as needed for allergies or rhinitis.    . Loratadine (CLARITIN PO) Take 1 tablet by mouth daily.     . metoprolol succinate (TOPROL-XL) 25 MG 24 hr tablet Take 1 tablet (25 mg total) by mouth daily. 90 tablet  3  . Multiple Vitamin  (MULTIVITAMIN) tablet Take 1 tablet by mouth daily.    . tamsulosin (FLOMAX) 0.4 MG CAPS capsule TAKE 1 CAPSULE BY MOUTH ONCE DAILY AFTERSUPPER FOR ENLARGED PROSTATE 90 capsule 1   No current facility-administered medications on file prior to visit.     BP 137/64   Pulse 72   Temp (!) 97.1 F (36.2 C)    Objective:   Physical Exam  Constitutional: He is oriented to person, place, and time.  Respiratory: Effort normal.  Neurological: He is alert and oriented to person, place, and time.  Psychiatric: He has a normal mood and affect.           Assessment & Plan:

## 2018-11-09 NOTE — Patient Instructions (Signed)
Call the main line for the lab and B12 appointments.  Use your cane when walking. Rise slowly from seated/lying positions.  Please come see me if your dizziness returns/progresses.  It was a pleasure to see you today!

## 2018-11-11 IMAGING — CR DG CHEST 2V
2 series · 2 of 2 positions shown · non-contrast
Comparison: 10/10/2015 and earlier.

CLINICAL DATA: 82-year-old male with fatigue. Initial encounter.

EXAM:
CHEST  2 VIEW

[w chest lat]
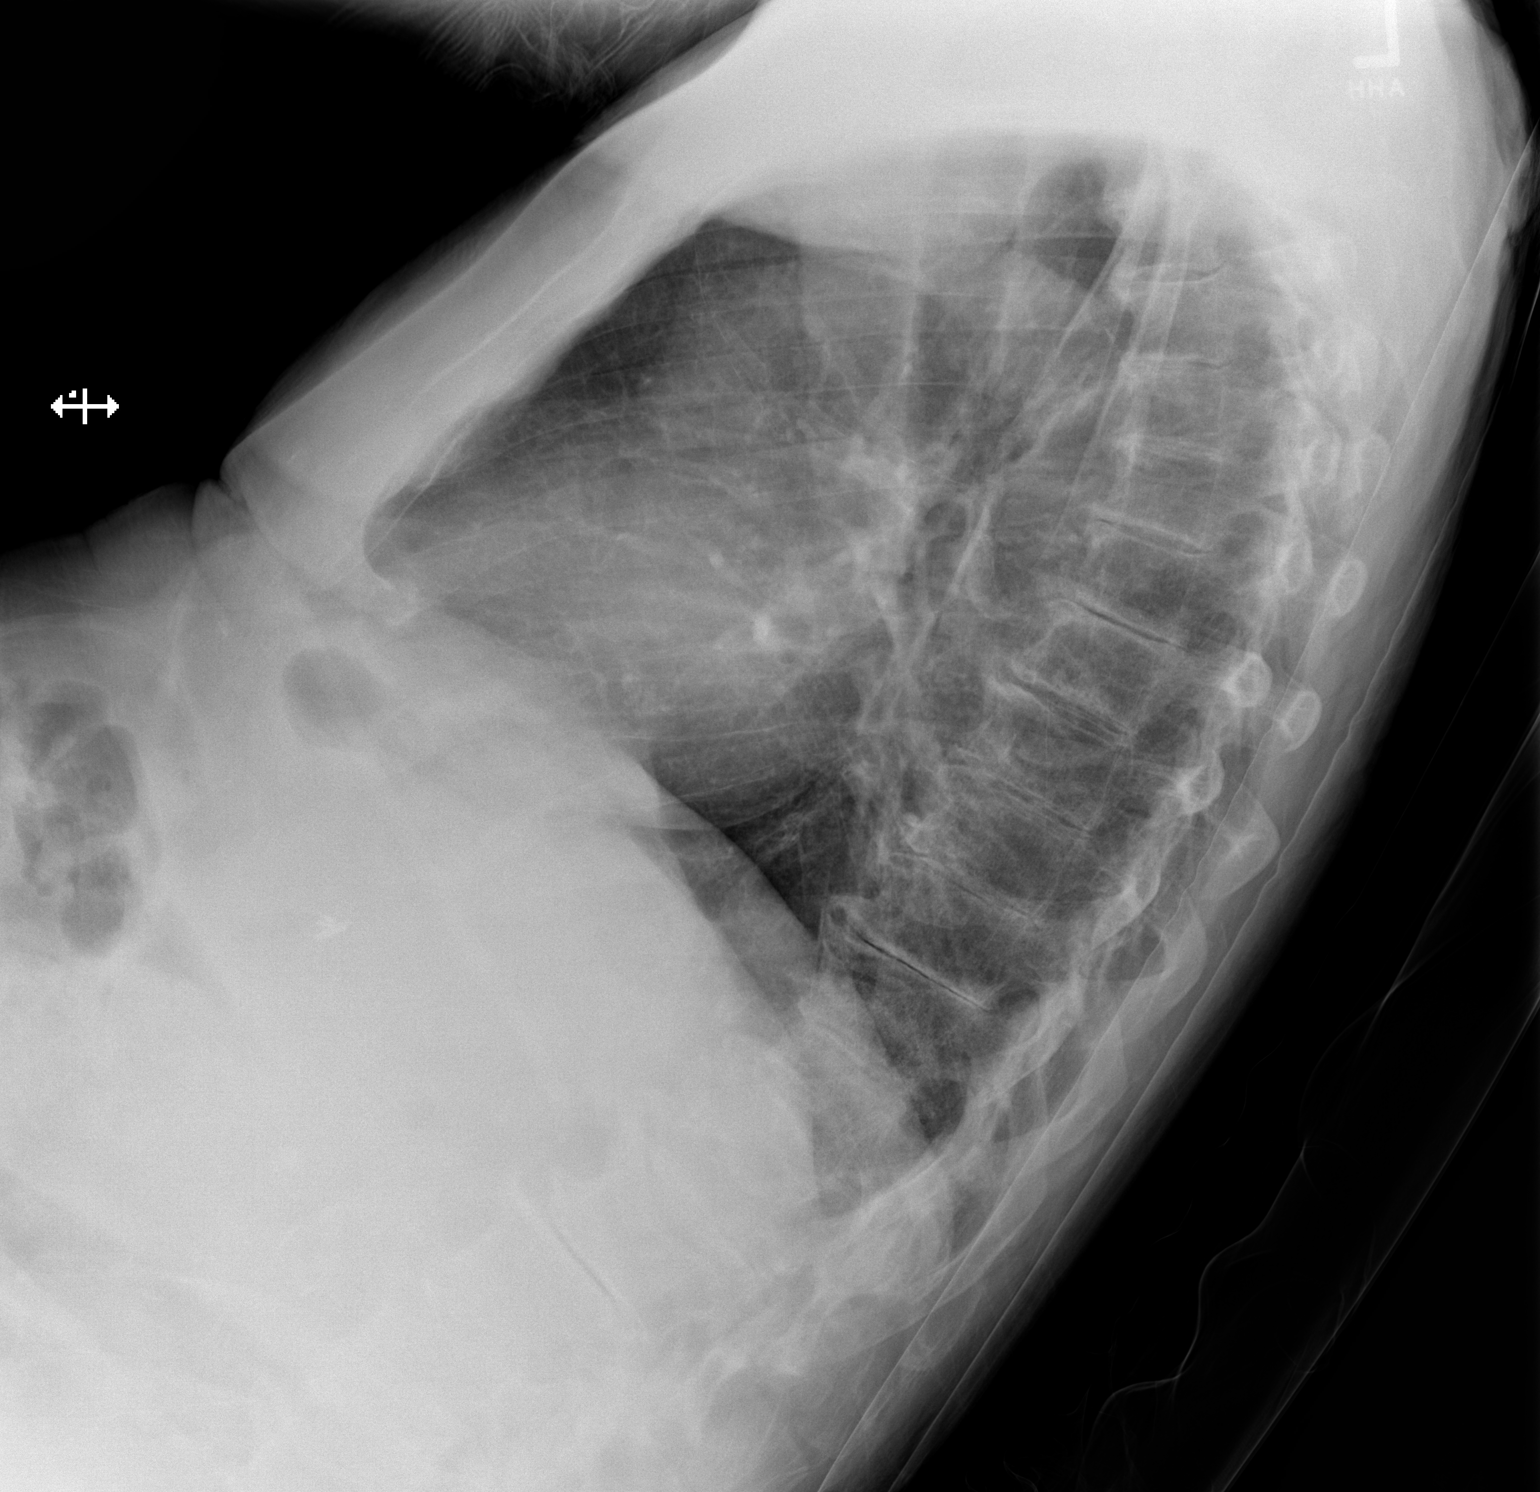

[x chest ap]
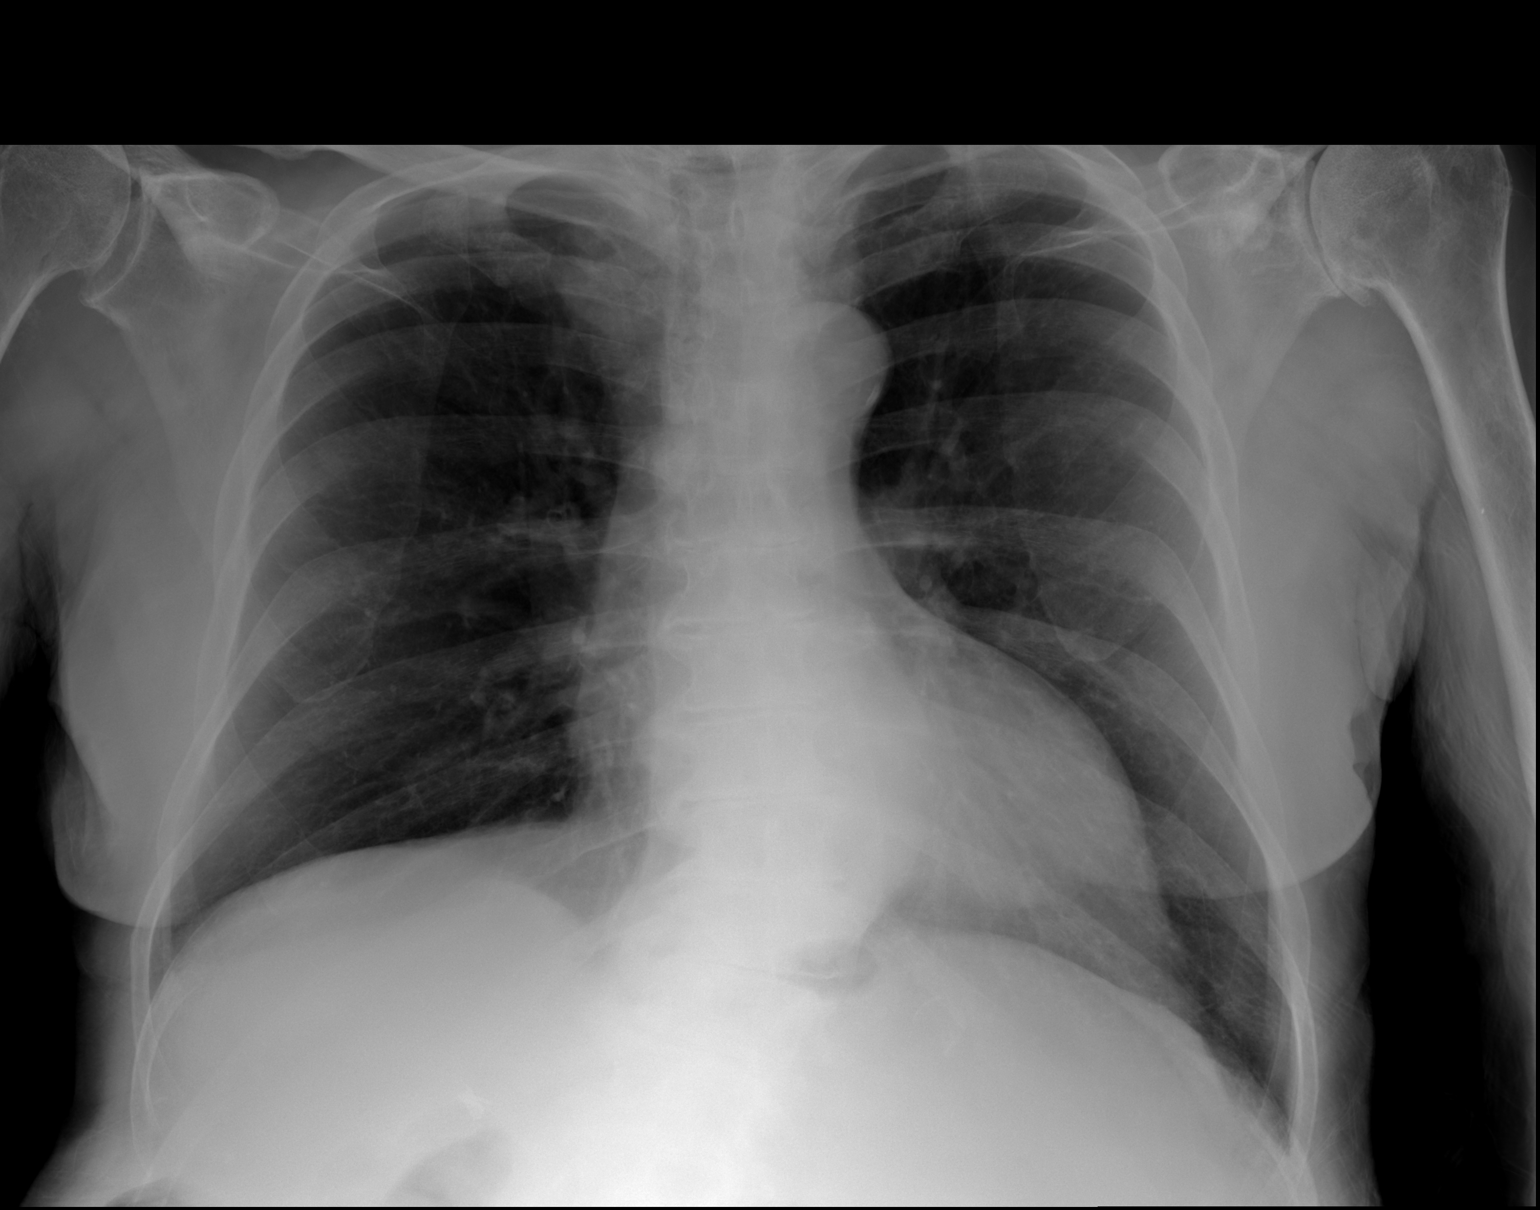

[2 of 2 positions shown; findings below may reference images not displayed]

FINDINGS: Semi upright AP and lateral views of the chest. Stable lung volumes.
No pneumothorax, pulmonary edema, pleural effusion or confluent
pulmonary opacity. Stable mild cardiomegaly. Other mediastinal
contours are within normal limits. Visualized tracheal air column is
within normal limits. No acute osseous abnormality identified.
Stable cholecystectomy clips.
IMPRESSION: No acute cardiopulmonary abnormality.

## 2018-11-23 ENCOUNTER — Other Ambulatory Visit (INDEPENDENT_AMBULATORY_CARE_PROVIDER_SITE_OTHER): Payer: Medicare Other

## 2018-11-23 ENCOUNTER — Telehealth: Payer: Self-pay | Admitting: Radiology

## 2018-11-23 ENCOUNTER — Ambulatory Visit (INDEPENDENT_AMBULATORY_CARE_PROVIDER_SITE_OTHER): Payer: Medicare Other

## 2018-11-23 DIAGNOSIS — E538 Deficiency of other specified B group vitamins: Secondary | ICD-10-CM | POA: Diagnosis not present

## 2018-11-23 DIAGNOSIS — R42 Dizziness and giddiness: Secondary | ICD-10-CM | POA: Diagnosis not present

## 2018-11-23 DIAGNOSIS — D510 Vitamin B12 deficiency anemia due to intrinsic factor deficiency: Secondary | ICD-10-CM | POA: Diagnosis not present

## 2018-11-23 LAB — BASIC METABOLIC PANEL
BUN: 18 mg/dL (ref 6–23)
CO2: 28 mEq/L (ref 19–32)
Calcium: 9 mg/dL (ref 8.4–10.5)
Chloride: 108 mEq/L (ref 96–112)
Creatinine, Ser: 0.93 mg/dL (ref 0.40–1.50)
GFR: 77.57 mL/min (ref >60.00)
Glucose, Bld: 103 mg/dL — ABNORMAL HIGH (ref 70–99)
Potassium: 4 mEq/L (ref 3.5–5.1)
Sodium: 143 mEq/L (ref 135–145)

## 2018-11-23 LAB — CBC
HCT: 46.5 % (ref 39.0–52.0)
Hemoglobin: 15.6 g/dL (ref 13.0–17.0)
MCHC: 33.6 g/dL (ref 30.0–36.0)
MCV: 85 fl (ref 78.0–100.0)
Platelets: 31 10*3/uL — CL (ref 150.0–400.0)
RBC: 5.47 Mil/uL (ref 4.22–5.81)
RDW: 15.1 % (ref 11.5–15.5)
WBC: 5.7 10*3/uL (ref 4.0–10.5)

## 2018-11-23 LAB — VITAMIN B12: Vitamin B-12: 407 pg/mL (ref 211–911)

## 2018-11-23 MED ORDER — CYANOCOBALAMIN 1000 MCG/ML IJ SOLN
1000.0000 ug | Freq: Once | INTRAMUSCULAR | Status: AC
  Administered 2018-11-23: 1000 ug via INTRAMUSCULAR

## 2018-11-23 NOTE — Telephone Encounter (Signed)
Please notify patient that his platelet count is now decreased further to 31,000. Typically he ranges from 50,000-100,000. When was the last time he saw hematology? Is he willing to go back?

## 2018-11-23 NOTE — Telephone Encounter (Signed)
[facility] called a critical plt count, 31.000. Results given to Allie Bossier

## 2018-11-23 NOTE — Progress Notes (Signed)
Pt received monthly B12 injection in left deltoid after he had his bloodwork. Tolerated well. He will wait to hear about his labs before scheduling more appointments.

## 2018-11-24 ENCOUNTER — Other Ambulatory Visit: Payer: Self-pay | Admitting: Primary Care

## 2018-11-24 DIAGNOSIS — D696 Thrombocytopenia, unspecified: Secondary | ICD-10-CM

## 2018-11-24 NOTE — Telephone Encounter (Signed)
Addressed in result note.  

## 2018-11-30 ENCOUNTER — Encounter: Payer: Self-pay | Admitting: Oncology

## 2018-11-30 ENCOUNTER — Inpatient Hospital Stay: Payer: Medicare Other | Attending: Oncology | Admitting: Oncology

## 2018-11-30 ENCOUNTER — Inpatient Hospital Stay: Payer: Medicare Other

## 2018-11-30 ENCOUNTER — Other Ambulatory Visit: Payer: Self-pay

## 2018-11-30 VITALS — BP 154/60 | HR 70 | Temp 96.0°F | Resp 16 | Wt 138.1 lb

## 2018-11-30 DIAGNOSIS — D696 Thrombocytopenia, unspecified: Secondary | ICD-10-CM | POA: Diagnosis not present

## 2018-11-30 DIAGNOSIS — Z87891 Personal history of nicotine dependence: Secondary | ICD-10-CM | POA: Diagnosis not present

## 2018-11-30 DIAGNOSIS — E611 Iron deficiency: Secondary | ICD-10-CM | POA: Diagnosis not present

## 2018-11-30 DIAGNOSIS — Z862 Personal history of diseases of the blood and blood-forming organs and certain disorders involving the immune mechanism: Secondary | ICD-10-CM | POA: Diagnosis not present

## 2018-11-30 LAB — CBC
HCT: 49.7 % (ref 39.0–52.0)
Hemoglobin: 16.4 g/dL (ref 13.0–17.0)
MCH: 28.3 pg (ref 26.0–34.0)
MCHC: 33 g/dL (ref 30.0–36.0)
MCV: 85.7 fL (ref 80.0–100.0)
Platelets: 89 10*3/uL — ABNORMAL LOW (ref 150–400)
RBC: 5.8 MIL/uL (ref 4.22–5.81)
RDW: 14.2 % (ref 11.5–15.5)
WBC: 6.6 10*3/uL (ref 4.0–10.5)
nRBC: 0 % (ref 0.0–0.2)

## 2018-11-30 LAB — IRON AND TIBC
Iron: 70 ug/dL (ref 45–182)
Saturation Ratios: 17 % — ABNORMAL LOW (ref 17.9–39.5)
TIBC: 414 ug/dL (ref 250–450)
UIBC: 344 ug/dL

## 2018-11-30 LAB — VITAMIN B12: Vitamin B-12: 688 pg/mL (ref 180–914)

## 2018-11-30 LAB — FOLATE: Folate: 54.7 ng/mL (ref >5.9)

## 2018-11-30 LAB — FERRITIN: Ferritin: 16 ng/mL — ABNORMAL LOW (ref 24–336)

## 2018-12-01 ENCOUNTER — Other Ambulatory Visit: Payer: Self-pay | Admitting: Oncology

## 2018-12-01 DIAGNOSIS — D696 Thrombocytopenia, unspecified: Secondary | ICD-10-CM

## 2018-12-01 LAB — PROTEIN ELECTROPHORESIS, SERUM
A/G Ratio: 1.8 — ABNORMAL HIGH (ref 0.7–1.7)
Albumin ELP: 4 g/dL (ref 2.9–4.4)
Alpha-1-Globulin: 0.2 g/dL (ref 0.0–0.4)
Alpha-2-Globulin: 0.7 g/dL (ref 0.4–1.0)
Beta Globulin: 0.8 g/dL (ref 0.7–1.3)
Gamma Globulin: 0.6 g/dL (ref 0.4–1.8)
Globulin, Total: 2.2 g/dL (ref 2.2–3.9)
Total Protein ELP: 6.2 g/dL (ref 6.0–8.5)

## 2018-12-01 LAB — ANA W/REFLEX: Anti Nuclear Antibody (ANA): NEGATIVE

## 2018-12-01 NOTE — Progress Notes (Signed)
[place]  Telephone:(336) [phone] Fax:(336) [phone]  ID: [name] OB: 19-Apr-1933  MR#: [id number]  QIO#:[id number]  Patient Care Team: Pleas Koch, NP as PCP - General (Internal Medicine) Annia Belt, MD as Consulting Physician (Oncology) Carolan Clines, MD (Inactive) as Consulting Physician (Urology) Lafayette Dragon, MD (Inactive) as Consulting Physician (Gastroenterology) Stanford Breed Denice Bors, MD as Consulting Physician (Cardiology)  CHIEF COMPLAINT: Thrombocytopenia  INTERVAL HISTORY: Patient is a 85 year old male with a longstanding history of decreased platelet count thought secondary to ITP.  He had a full work-up by another provider approximately 4 to 5 years ago that did not reveal distinct etiology.  Recently he was found to have a significantly reduced platelet count of 31.  He currently feels well and is asymptomatic.  He denies any easy bleeding or bruising.  He has a good appetite and denies weight loss.  He has no neurologic complaints.  He denies any recent fevers or illnesses.  He has no new medications.  He denies any chest pain, shortness of breath, cough, or hemoptysis.  He denies any nausea, vomiting, constipation, or diarrhea.  He has no melena or hematochezia.  He has no urinary complaints.  Patient feels at his baseline offers no specific complaints today.  REVIEW OF SYSTEMS:   Review of Systems  Constitutional: Negative.  Negative for fever, malaise/fatigue and weight loss.  Respiratory: Negative.  Negative for cough, hemoptysis and shortness of breath.   Cardiovascular: Negative.  Negative for chest pain and leg swelling.  Gastrointestinal: Negative.  Negative for abdominal pain, blood in stool and melena.  Genitourinary: Negative.  Negative for dysuria and hematuria.  Musculoskeletal: Negative.  Negative for back pain.  Skin: Negative.  Negative for rash.  Neurological: Negative.  Negative for dizziness, focal  weakness, weakness and headaches.  Endo/Heme/Allergies: Does not bruise/bleed easily.  Psychiatric/Behavioral: Negative.  The patient is not nervous/anxious.     As per HPI. Otherwise, a complete review of systems is negative.  PAST MEDICAL HISTORY: Past Medical History:  Diagnosis Date  . Arthralgia of multiple joints    limited mobility w/  independant adl's  . BPH with obstruction/lower urinary tract symptoms   . Chronic idiopathic monocytosis    followed by dr Waymon Budge--  per lov note 06/ 2017 persistant unexplained  with normal bone barrow bx  . Coronary atherosclerosis of unspecified type of vessel, native or graft    cardiologist-  dr Stanford Breed -- per lov note 11-19-2014 ,  cardiac cath in 2000-- pLAD 60-70%,  small intermediate branch 70-80%,  mRCA 20%,  normal LV  . Degenerative arthritis of spine    cervical and lumar  . Diverticulosis of colon (without mention of hemorrhage)   . Dysuria    chronic  . Elevated PSA   . Feeling of incomplete bladder emptying   . Hiatal hernia    moderate per ct 11/ 2017  . History of adenomatous polyp of colon    2008  . History of atrial fibrillation    remote hx episode  . History of pancreatitis    07-01-2013  and 10-10-2015  . History of prostatitis    2014  . Hx of colonic polyps   . Hyperlipidemia   . Hypertension   . Mouth sore    roof of mouth sore   . Nephrolithiasis   . Nocturia more than twice per night    severe  w/ leakage  . OSA (obstructive sleep apnea)    INTOLERANT  CPAP  . Osteoarthritis   . Pernicious anemia    B12  Def.  . Peyronie disease   . Renal insufficiency   . Sepsis ([place]) 03/22/2017  . Thrombocytopenia, unspecified ([place]) hemotology/oncologist-  dr Waymon Budge   per dr Velta Addison note 06/ 2016  secondary to vitro clumping    PAST SURGICAL HISTORY: Past Surgical History:  Procedure Laterality Date  . BONE MARROW BIOPSY  2011   normal  . CARDIAC CATHETERIZATION  2000   per dr Kathyrn Drown note --  dLAD 60-70%,  small intermediate branch 70-80%,  mRCA 20%,  normal LV  . CARDIOVASCULAR STRESS TEST  01-29-2013   dr Stanford Breed   normal nuclear study w/ no ischemia,  normal LV function and wall motion , ef 82%  . CATARACT EXTRACTION W/ INTRAOCULAR LENS  IMPLANT, BILATERAL  08/2015  . CHOLECYSTECTOMY  11/24/2010   Procedure: LAPAROSCOPIC CHOLECYSTECTOMY WITH INTRAOPERATIVE CHOLANGIOGRAM;  Surgeon: Earnstine Regal, MD;  Location: [facility];  Service: General;  Laterality: N/A;  c-arm  . CYSTOSCOPY W/ URETERAL STENT PLACEMENT Left 12/17/2015   Procedure: CYSTOSCOPY WITH RETROGRADE PYELOGRAM/URETERAL STENT PLACEMENT;  Surgeon: Ardis Hughs, MD;  Location: [facility];  Service: Urology;  Laterality: Left;  . CYSTOSCOPY WITH RETROGRADE PYELOGRAM, URETEROSCOPY AND STENT PLACEMENT Left 12/22/2015   Procedure: CYSTOSCOPY WITH LEFT RETROGRADE  URETEROSCOPY AND STENT PLACEMENT;  Surgeon: Carolan Clines, MD;  Location: [facility];  Service: Urology;  Laterality: Left;  . ERCP N/A 07/04/2013   Procedure: ENDOSCOPIC RETROGRADE CHOLANGIOPANCREATOGRAPHY (ERCP);  Surgeon: Gatha Mayer, MD;  Location: [facility];  Service: Endoscopy;  Laterality: N/A;  MAC if available  . ERCP N/A 10/12/2015   Procedure: ENDOSCOPIC RETROGRADE CHOLANGIOPANCREATOGRAPHY (ERCP);  Surgeon: [name], MD;  Location: [facility];  Service: Endoscopy;  Laterality: N/A;  . HOLMIUM LASER APPLICATION Left 39/0/3009   Procedure: HOLMIUM LASER APPLICATION;  Surgeon: Carolan Clines, MD;  Location: [facility];  Service: Urology;  Laterality: Left;  . INGUINAL HERNIA REPAIR Right 01/25/2002  . KNEE ARTHROSCOPY  x5  . SATURATION BIOPSY OF PROSTATE  05-29-2007  and 01-26-2008  . SHOULDER ARTHROSCOPY WITH OPEN ROTATOR CUFF REPAIR AND DISTAL CLAVICLE ACROMINECTOMY Right 11/19/2004  . TOTAL HIP ARTHROPLASTY Right 05/21/2009  . TOTAL KNEE ARTHROPLASTY Bilateral left 05-02-2003/  right  03-23-2010  .  TRANSTHORACIC ECHOCARDIOGRAM  12/23/2004   ef 60%, mild MV calcification without stenosis/  mild TR,  PASP 45mHg  . UVULOPALATOPHARYNGOPLASTY  1993    w/  T & A    FAMILY HISTORY: Family History  Problem Relation Age of Onset  . Colon cancer Father 481 . Peripheral vascular disease Mother     ADVANCED DIRECTIVES (Y/N):  N  HEALTH MAINTENANCE: Social History   Tobacco Use  . Smoking status: Former Smoker    Types: Pipe    Quit date: 01/18/1966    Years since quitting: 52.9  . Smokeless tobacco: Never Used  Substance Use Topics  . Alcohol use: No    Alcohol/week: 0.0 standard drinks  . Drug use: No     Colonoscopy:  PAP:  Bone density:  Lipid panel:  Allergies  Allergen Reactions  . Penicillins Hives and Other (See Comments)    Whelps, passed out Tolerates cephalosporins  Has patient had a PCN reaction causing immediate rash, facial/tongue/throat swelling, SOB or lightheadedness with hypotension:  yes Has patient had a PCN reaction causing severe rash involving mucus membranes or skin necrosis: no Has patient  had a PCN reaction that required hospitalization: no Has patient had a PCN reaction occurring within the last 10 years: no If all of the above answers are "NO", then may proceed with Cephalosporin use.     Current Outpatient Medications  Medication Sig Dispense Refill  . acetaminophen (TYLENOL) 325 MG tablet Take 650 mg by mouth every 6 (six) hours as needed for mild pain.    . Artificial Tear Solution (TEARS NATURALE OP) Place 1-2 drops into both eyes at bedtime as needed (dry eyes).    . fluticasone (FLONASE) 50 MCG/ACT nasal spray Place 1 spray into both nostrils as needed for allergies or rhinitis.    . Loratadine (CLARITIN PO) Take 1 tablet by mouth daily.     . metoprolol succinate (TOPROL-XL) 25 MG 24 hr tablet Take 1 tablet (25 mg total) by mouth daily. 90 tablet 3  . Multiple Vitamin (MULTIVITAMIN) tablet Take 1 tablet by mouth daily.    .  tamsulosin (FLOMAX) 0.4 MG CAPS capsule TAKE 1 CAPSULE BY MOUTH ONCE DAILY AFTERSUPPER FOR ENLARGED PROSTATE 90 capsule 1   No current facility-administered medications for this visit.     OBJECTIVE: Vitals:   11/30/18 1524  BP: (!) 154/60  Pulse: 70  Resp: 16  Temp: (!) 96 F (35.6 C)  SpO2: 98%     Body mass index is 26.97 kg/m.    ECOG FS:0 - Asymptomatic  General: Well-developed, well-nourished, no acute distress. Eyes: Pink conjunctiva, anicteric sclera. HEENT: Normocephalic, moist mucous membranes, clear oropharnyx. Lungs: Clear to auscultation bilaterally. Heart: Regular rate and rhythm. No rubs, murmurs, or gallops. Abdomen: Soft, nontender, nondistended. No organomegaly noted, normoactive bowel sounds. Musculoskeletal: No edema, cyanosis, or clubbing. Neuro: Alert, answering all questions appropriately. Cranial nerves grossly intact. Skin: No rashes or petechiae noted. Psych: Normal affect. Lymphatics: No cervical, calvicular, axillary or inguinal LAD.   LAB RESULTS:  Lab Results  Component Value Date   NA 143 11/23/2018   K 4.0 11/23/2018   CL 108 11/23/2018   CO2 28 11/23/2018   GLUCOSE 103 (H) 11/23/2018   BUN 18 11/23/2018   CREATININE 0.93 11/23/2018   CALCIUM 9.0 11/23/2018   PROT 6.1 08/22/2018   ALBUMIN 4.0 08/22/2018   AST 32 08/22/2018   ALT 89 (H) 08/22/2018   ALKPHOS 297 (H) 08/22/2018   BILITOT 1.7 (H) 08/22/2018   GFRNONAA >60 03/24/2017   GFRAA >60 03/24/2017    Lab Results  Component Value Date   WBC 6.6 11/30/2018   NEUTROABS 12.8 (H) 03/22/2017   HGB 16.4 11/30/2018   HCT 49.7 11/30/2018   MCV 85.7 11/30/2018   PLT 89 (L) 11/30/2018   Lab Results  Component Value Date   IRON 70 11/30/2018   TIBC 414 11/30/2018   IRONPCTSAT 17 (L) 11/30/2018  ' Lab Results  Component Value Date   FERRITIN 16 (L) 11/30/2018     STUDIES: No results found.  ASSESSMENT: Thrombocytopenia.  PLAN:    1.  Thrombocytopenia: Previously,  patient was noted to have significant platelet clumping, but when drawn in citrate tube his platelets are approximately his baseline of 89 today.  Patient has a mild iron deficiency, but the remainder of his laboratory work is either negative or within normal limits.  ANA, platelet antibodies, and SPEP were drawn for completeness and are pending at time of dictation.  Patient underwent bone marrow biopsy on January 30, 2009 that reported hypercellular for age, but otherwise normal.  No intervention is needed at  this time.  Return to clinic in 1 month with repeat laboratory work and further evaluation. 2.  History of absolute monocytosis: Patient's white blood cell count is within normal limits today.  Peripheral blood flow cytometry was sent for completeness. 3.  History of B12 deficiency: Patient's hemoglobin and B12 levels are within normal limits. 4.  Iron deficiency: Can consider dietary changes if necessary.   Patient expressed understanding and was in agreement with this plan. He also understands that He can call clinic at any time with any questions, concerns, or complaints.    Lloyd Huger, MD   12/01/2018 6:58 AM

## 2018-12-02 LAB — PLATELET ANTIBODY PROFILE
Glycoprotein IV Antibody: NEGATIVE
HLA Ab Ser Ql EIA: NEGATIVE
IA/IIA Antibody: NEGATIVE
IB/IX Antibody: NEGATIVE
IIB/IIIA Antibody: NEGATIVE

## 2018-12-05 ENCOUNTER — Telehealth: Payer: Self-pay

## 2018-12-05 NOTE — Telephone Encounter (Signed)
Pt wanted to get lab results from oncology. I advised pt that he would need to call Dr Gary Fleet office; I gave pt the [phone] and transferred the call for pt.

## 2018-12-06 LAB — COMP PANEL: LEUKEMIA/LYMPHOMA

## 2018-12-24 NOTE — Progress Notes (Deleted)
[place]  Telephone:(336) [phone] Fax:(336) [phone]  ID: [name] OB: 04-Sep-1933  MR#: [id number]  QIO#:[id number]  Patient Care Team: Pleas Koch, NP as PCP - General (Internal Medicine) Annia Belt, MD as Consulting Physician (Oncology) Carolan Clines, MD (Inactive) as Consulting Physician (Urology) Lafayette Dragon, MD (Inactive) as Consulting Physician (Gastroenterology) Stanford Breed Denice Bors, MD as Consulting Physician (Cardiology)  CHIEF COMPLAINT: Thrombocytopenia  INTERVAL HISTORY: Patient is a 85 year old male with a longstanding history of decreased platelet count thought secondary to ITP.  He had a full work-up by another provider approximately 4 to 5 years ago that did not reveal distinct etiology.  Recently he was found to have a significantly reduced platelet count of 31.  He currently feels well and is asymptomatic.  He denies any easy bleeding or bruising.  He has a good appetite and denies weight loss.  He has no neurologic complaints.  He denies any recent fevers or illnesses.  He has no new medications.  He denies any chest pain, shortness of breath, cough, or hemoptysis.  He denies any nausea, vomiting, constipation, or diarrhea.  He has no melena or hematochezia.  He has no urinary complaints.  Patient feels at his baseline offers no specific complaints today.  REVIEW OF SYSTEMS:   Review of Systems  Constitutional: Negative.  Negative for fever, malaise/fatigue and weight loss.  Respiratory: Negative.  Negative for cough, hemoptysis and shortness of breath.   Cardiovascular: Negative.  Negative for chest pain and leg swelling.  Gastrointestinal: Negative.  Negative for abdominal pain, blood in stool and melena.  Genitourinary: Negative.  Negative for dysuria and hematuria.  Musculoskeletal: Negative.  Negative for back pain.  Skin: Negative.  Negative for rash.  Neurological: Negative.  Negative for dizziness, focal  weakness, weakness and headaches.  Endo/Heme/Allergies: Does not bruise/bleed easily.  Psychiatric/Behavioral: Negative.  The patient is not nervous/anxious.     As per HPI. Otherwise, a complete review of systems is negative.  PAST MEDICAL HISTORY: Past Medical History:  Diagnosis Date  . Arthralgia of multiple joints    limited mobility w/  independant adl's  . BPH with obstruction/lower urinary tract symptoms   . Chronic idiopathic monocytosis    followed by dr Waymon Budge--  per lov note 06/ 2017 persistant unexplained  with normal bone barrow bx  . Coronary atherosclerosis of unspecified type of vessel, native or graft    cardiologist-  dr Stanford Breed -- per lov note 11-19-2014 ,  cardiac cath in 2000-- pLAD 60-70%,  small intermediate branch 70-80%,  mRCA 20%,  normal LV  . Degenerative arthritis of spine    cervical and lumar  . Diverticulosis of colon (without mention of hemorrhage)   . Dysuria    chronic  . Elevated PSA   . Feeling of incomplete bladder emptying   . Hiatal hernia    moderate per ct 11/ 2017  . History of adenomatous polyp of colon    2008  . History of atrial fibrillation    remote hx episode  . History of pancreatitis    07-01-2013  and 10-10-2015  . History of prostatitis    2014  . Hx of colonic polyps   . Hyperlipidemia   . Hypertension   . Mouth sore    roof of mouth sore   . Nephrolithiasis   . Nocturia more than twice per night    severe  w/ leakage  . OSA (obstructive sleep apnea)    INTOLERANT  CPAP  . Osteoarthritis   . Pernicious anemia    B12  Def.  . Peyronie disease   . Renal insufficiency   . Sepsis ([place]) 03/22/2017  . Thrombocytopenia, unspecified ([place]) hemotology/oncologist-  dr Waymon Budge   per dr Velta Addison note 06/ 2016  secondary to vitro clumping    PAST SURGICAL HISTORY: Past Surgical History:  Procedure Laterality Date  . BONE MARROW BIOPSY  2011   normal  . CARDIAC CATHETERIZATION  2000   per dr Kathyrn Drown note --  dLAD 60-70%,  small intermediate branch 70-80%,  mRCA 20%,  normal LV  . CARDIOVASCULAR STRESS TEST  01-29-2013   dr Stanford Breed   normal nuclear study w/ no ischemia,  normal LV function and wall motion , ef 82%  . CATARACT EXTRACTION W/ INTRAOCULAR LENS  IMPLANT, BILATERAL  08/2015  . CHOLECYSTECTOMY  11/24/2010   Procedure: LAPAROSCOPIC CHOLECYSTECTOMY WITH INTRAOPERATIVE CHOLANGIOGRAM;  Surgeon: Earnstine Regal, MD;  Location: [facility];  Service: General;  Laterality: N/A;  c-arm  . CYSTOSCOPY W/ URETERAL STENT PLACEMENT Left 12/17/2015   Procedure: CYSTOSCOPY WITH RETROGRADE PYELOGRAM/URETERAL STENT PLACEMENT;  Surgeon: Ardis Hughs, MD;  Location: [facility];  Service: Urology;  Laterality: Left;  . CYSTOSCOPY WITH RETROGRADE PYELOGRAM, URETEROSCOPY AND STENT PLACEMENT Left 12/22/2015   Procedure: CYSTOSCOPY WITH LEFT RETROGRADE  URETEROSCOPY AND STENT PLACEMENT;  Surgeon: Carolan Clines, MD;  Location: [facility];  Service: Urology;  Laterality: Left;  . ERCP N/A 07/04/2013   Procedure: ENDOSCOPIC RETROGRADE CHOLANGIOPANCREATOGRAPHY (ERCP);  Surgeon: Gatha Mayer, MD;  Location: [facility];  Service: Endoscopy;  Laterality: N/A;  MAC if available  . ERCP N/A 10/12/2015   Procedure: ENDOSCOPIC RETROGRADE CHOLANGIOPANCREATOGRAPHY (ERCP);  Surgeon: [name], MD;  Location: [facility];  Service: Endoscopy;  Laterality: N/A;  . HOLMIUM LASER APPLICATION Left 39/0/3009   Procedure: HOLMIUM LASER APPLICATION;  Surgeon: Carolan Clines, MD;  Location: [facility];  Service: Urology;  Laterality: Left;  . INGUINAL HERNIA REPAIR Right 01/25/2002  . KNEE ARTHROSCOPY  x5  . SATURATION BIOPSY OF PROSTATE  05-29-2007  and 01-26-2008  . SHOULDER ARTHROSCOPY WITH OPEN ROTATOR CUFF REPAIR AND DISTAL CLAVICLE ACROMINECTOMY Right 11/19/2004  . TOTAL HIP ARTHROPLASTY Right 05/21/2009  . TOTAL KNEE ARTHROPLASTY Bilateral left 05-02-2003/  right  03-23-2010  .  TRANSTHORACIC ECHOCARDIOGRAM  12/23/2004   ef 60%, mild MV calcification without stenosis/  mild TR,  PASP 45mHg  . UVULOPALATOPHARYNGOPLASTY  1993    w/  T & A    FAMILY HISTORY: Family History  Problem Relation Age of Onset  . Colon cancer Father 481 . Peripheral vascular disease Mother     ADVANCED DIRECTIVES (Y/N):  N  HEALTH MAINTENANCE: Social History   Tobacco Use  . Smoking status: Former Smoker    Types: Pipe    Quit date: 01/18/1966    Years since quitting: 52.9  . Smokeless tobacco: Never Used  Substance Use Topics  . Alcohol use: No    Alcohol/week: 0.0 standard drinks  . Drug use: No     Colonoscopy:  PAP:  Bone density:  Lipid panel:  Allergies  Allergen Reactions  . Penicillins Hives and Other (See Comments)    Whelps, passed out Tolerates cephalosporins  Has patient had a PCN reaction causing immediate rash, facial/tongue/throat swelling, SOB or lightheadedness with hypotension:  yes Has patient had a PCN reaction causing severe rash involving mucus membranes or skin necrosis: no Has patient  had a PCN reaction that required hospitalization: no Has patient had a PCN reaction occurring within the last 10 years: no If all of the above answers are "NO", then may proceed with Cephalosporin use.     Current Outpatient Medications  Medication Sig Dispense Refill  . acetaminophen (TYLENOL) 325 MG tablet Take 650 mg by mouth every 6 (six) hours as needed for mild pain.    . Artificial Tear Solution (TEARS NATURALE OP) Place 1-2 drops into both eyes at bedtime as needed (dry eyes).    . fluticasone (FLONASE) 50 MCG/ACT nasal spray Place 1 spray into both nostrils as needed for allergies or rhinitis.    . Loratadine (CLARITIN PO) Take 1 tablet by mouth daily.     . metoprolol succinate (TOPROL-XL) 25 MG 24 hr tablet Take 1 tablet (25 mg total) by mouth daily. 90 tablet 3  . Multiple Vitamin (MULTIVITAMIN) tablet Take 1 tablet by mouth daily.    .  tamsulosin (FLOMAX) 0.4 MG CAPS capsule TAKE 1 CAPSULE BY MOUTH ONCE DAILY AFTERSUPPER FOR ENLARGED PROSTATE 90 capsule 1   No current facility-administered medications for this visit.     OBJECTIVE: There were no vitals filed for this visit.   There is no height or weight on file to calculate BMI.    ECOG FS:0 - Asymptomatic  General: Well-developed, well-nourished, no acute distress. Eyes: Pink conjunctiva, anicteric sclera. HEENT: Normocephalic, moist mucous membranes, clear oropharnyx. Lungs: Clear to auscultation bilaterally. Heart: Regular rate and rhythm. No rubs, murmurs, or gallops. Abdomen: Soft, nontender, nondistended. No organomegaly noted, normoactive bowel sounds. Musculoskeletal: No edema, cyanosis, or clubbing. Neuro: Alert, answering all questions appropriately. Cranial nerves grossly intact. Skin: No rashes or petechiae noted. Psych: Normal affect. Lymphatics: No cervical, calvicular, axillary or inguinal LAD.   LAB RESULTS:  Lab Results  Component Value Date   NA 143 11/23/2018   K 4.0 11/23/2018   CL 108 11/23/2018   CO2 28 11/23/2018   GLUCOSE 103 (H) 11/23/2018   BUN 18 11/23/2018   CREATININE 0.93 11/23/2018   CALCIUM 9.0 11/23/2018   PROT 6.1 08/22/2018   ALBUMIN 4.0 08/22/2018   AST 32 08/22/2018   ALT 89 (H) 08/22/2018   ALKPHOS 297 (H) 08/22/2018   BILITOT 1.7 (H) 08/22/2018   GFRNONAA >60 03/24/2017   GFRAA >60 03/24/2017    Lab Results  Component Value Date   WBC 6.6 11/30/2018   NEUTROABS 12.8 (H) 03/22/2017   HGB 16.4 11/30/2018   HCT 49.7 11/30/2018   MCV 85.7 11/30/2018   PLT 89 (L) 11/30/2018   Lab Results  Component Value Date   IRON 70 11/30/2018   TIBC 414 11/30/2018   IRONPCTSAT 17 (L) 11/30/2018  ' Lab Results  Component Value Date   FERRITIN 16 (L) 11/30/2018     STUDIES: No results found.  ASSESSMENT: Thrombocytopenia.  PLAN:    1.  Thrombocytopenia: Previously, patient was noted to have significant  platelet clumping, but when drawn in citrate tube his platelets are approximately his baseline of 89 today.  Patient has a mild iron deficiency, but the remainder of his laboratory work is either negative or within normal limits.  ANA, platelet antibodies, and SPEP were drawn for completeness and are pending at time of dictation.  Patient underwent bone marrow biopsy on January 30, 2009 that reported hypercellular for age, but otherwise normal.  No intervention is needed at this time.  Return to clinic in 1 month with repeat laboratory work and  further evaluation. 2.  History of absolute monocytosis: Patient's white blood cell count is within normal limits today.  Peripheral blood flow cytometry was sent for completeness. 3.  History of B12 deficiency: Patient's hemoglobin and B12 levels are within normal limits. 4.  Iron deficiency: Can consider dietary changes if necessary.   Patient expressed understanding and was in agreement with this plan. He also understands that He can call clinic at any time with any questions, concerns, or complaints.    Lloyd Huger, MD   12/24/2018 8:25 AM

## 2018-12-28 ENCOUNTER — Inpatient Hospital Stay: Payer: Medicare Other | Attending: Oncology

## 2018-12-28 ENCOUNTER — Telehealth: Payer: Self-pay | Admitting: Primary Care

## 2018-12-28 ENCOUNTER — Other Ambulatory Visit: Payer: Self-pay

## 2018-12-28 DIAGNOSIS — D696 Thrombocytopenia, unspecified: Secondary | ICD-10-CM | POA: Diagnosis not present

## 2018-12-28 LAB — CBC WITH DIFFERENTIAL/PLATELET
Abs Immature Granulocytes: 0.04 10*3/uL (ref 0.00–0.07)
Basophils Absolute: 0 10*3/uL (ref 0.0–0.1)
Basophils Relative: 0 %
Eosinophils Absolute: 0.1 10*3/uL (ref 0.0–0.5)
Eosinophils Relative: 2 %
HCT: 46.5 % (ref 39.0–52.0)
Hemoglobin: 15 g/dL (ref 13.0–17.0)
Immature Granulocytes: 1 %
Lymphocytes Relative: 17 %
Lymphs Abs: 1 10*3/uL (ref 0.7–4.0)
MCH: 27.8 pg (ref 26.0–34.0)
MCHC: 32.3 g/dL (ref 30.0–36.0)
MCV: 86.1 fL (ref 80.0–100.0)
Monocytes Absolute: 1.6 10*3/uL — ABNORMAL HIGH (ref 0.1–1.0)
Monocytes Relative: 26 %
Neutro Abs: 3.3 10*3/uL (ref 1.7–7.7)
Neutrophils Relative %: 54 %
Platelets: 33 10*3/uL — ABNORMAL LOW (ref 150–400)
RBC: 5.4 MIL/uL (ref 4.22–5.81)
RDW: 14.3 % (ref 11.5–15.5)
WBC: 6.1 10*3/uL (ref 4.0–10.5)
nRBC: 0 % (ref 0.0–0.2)

## 2018-12-28 NOTE — Telephone Encounter (Signed)
Placed in [place] for review

## 2018-12-28 NOTE — Telephone Encounter (Signed)
Spoken to patient. He cannot walk in a long period of time. Completed and left in the front office. Patient has been notified.

## 2018-12-28 NOTE — Telephone Encounter (Signed)
Completed and handed to Dawayne Cirri to find out the reason for his request.

## 2018-12-28 NOTE — Telephone Encounter (Signed)
Pt dropped off parking placard form to be filled out.Placed in RX tower °

## 2018-12-29 ENCOUNTER — Encounter: Payer: Self-pay | Admitting: Oncology

## 2018-12-29 ENCOUNTER — Inpatient Hospital Stay: Payer: Medicare Other | Admitting: Oncology

## 2018-12-29 ENCOUNTER — Other Ambulatory Visit: Payer: Medicare Other

## 2018-12-29 ENCOUNTER — Ambulatory Visit: Payer: Medicare Other | Admitting: Oncology

## 2018-12-30 NOTE — Progress Notes (Signed)
This encounter was created in error - please disregard.

## 2019-01-05 ENCOUNTER — Other Ambulatory Visit: Payer: Self-pay | Admitting: Primary Care

## 2019-01-15 ENCOUNTER — Telehealth: Payer: Self-pay | Admitting: Primary Care

## 2019-01-15 NOTE — Telephone Encounter (Signed)
Pt had called office asking about why his appt for labs on 12/29 was cancelled. I returned patients call to explain that [facility] sent a message to [name] stating he doesn't need the labs drawn. I left a detailed voicemail explaining this to him.

## 2019-01-16 ENCOUNTER — Other Ambulatory Visit: Payer: Medicare Other

## 2019-04-03 ENCOUNTER — Ambulatory Visit (INDEPENDENT_AMBULATORY_CARE_PROVIDER_SITE_OTHER): Payer: Medicare HMO | Admitting: *Deleted

## 2019-04-03 ENCOUNTER — Other Ambulatory Visit: Payer: Self-pay

## 2019-04-03 DIAGNOSIS — E538 Deficiency of other specified B group vitamins: Secondary | ICD-10-CM | POA: Diagnosis not present

## 2019-04-03 MED ORDER — CYANOCOBALAMIN 1000 MCG/ML IJ SOLN
1000.0000 ug | Freq: Once | INTRAMUSCULAR | Status: AC
  Administered 2019-04-03: 1000 ug via INTRAMUSCULAR

## 2019-04-03 NOTE — Progress Notes (Signed)
Per orders of Alma Friendly, NP, injection of Vit B12 given by Virl Cagey. Patient tolerated injection well.   Set up 3 appts for the patients next B12 shots to help keep some consistency - pt missed the last 3 months of shots d/t Covid and his appts being cancelled and never rescheduled. Patient was very appreciative of me getting his upcoming B12 appts set up so that they are already planned.

## 2019-05-08 ENCOUNTER — Ambulatory Visit (INDEPENDENT_AMBULATORY_CARE_PROVIDER_SITE_OTHER): Payer: Medicare HMO

## 2019-05-08 ENCOUNTER — Telehealth: Payer: Self-pay

## 2019-05-08 DIAGNOSIS — H16223 Keratoconjunctivitis sicca, not specified as Sjogren's, bilateral: Secondary | ICD-10-CM | POA: Diagnosis not present

## 2019-05-08 DIAGNOSIS — Z9841 Cataract extraction status, right eye: Secondary | ICD-10-CM | POA: Diagnosis not present

## 2019-05-08 DIAGNOSIS — H04123 Dry eye syndrome of bilateral lacrimal glands: Secondary | ICD-10-CM | POA: Diagnosis not present

## 2019-05-08 DIAGNOSIS — E538 Deficiency of other specified B group vitamins: Secondary | ICD-10-CM | POA: Diagnosis not present

## 2019-05-08 DIAGNOSIS — Z9842 Cataract extraction status, left eye: Secondary | ICD-10-CM | POA: Diagnosis not present

## 2019-05-08 DIAGNOSIS — H52223 Regular astigmatism, bilateral: Secondary | ICD-10-CM | POA: Diagnosis not present

## 2019-05-08 MED ORDER — CYANOCOBALAMIN 1000 MCG/ML IJ SOLN
1000.0000 ug | Freq: Once | INTRAMUSCULAR | Status: AC
  Administered 2019-05-08: 1000 ug via INTRAMUSCULAR

## 2019-05-08 NOTE — Progress Notes (Signed)
Per orders of Kate Clark, NP, injection of B12 given by Trueman Worlds. Patient tolerated injection well.  

## 2019-05-08 NOTE — Telephone Encounter (Signed)
[place] Night - Client Nonclinical Telephone Record AccessNurse Client [place] Night - Client Client Site [place] Physician [facility] - PHYSICIAN, NOT LISTED- MD Contact Type Call Who Is Calling Patient / Member / Family / Caregiver Caller Name [place] Phone Number Caller declined Call Type Message Only Information Provided Reason for Call Returning a Call from the Office Initial Comment Caller missed a call form the office. Additional Comment Caller returned their missed call. Caller declined to provide a phone number or their physicians name. Disp. Time Disposition Final User 05/07/2019 5:13:00 PM General Information Provided Yes Gerhard Perches Call Closed By: Gerhard Perches Transaction Date/Time: 05/07/2019 5:10:32 PM (ET)

## 2019-06-12 ENCOUNTER — Ambulatory Visit (INDEPENDENT_AMBULATORY_CARE_PROVIDER_SITE_OTHER): Payer: Medicare HMO

## 2019-06-12 DIAGNOSIS — E538 Deficiency of other specified B group vitamins: Secondary | ICD-10-CM | POA: Diagnosis not present

## 2019-06-12 MED ORDER — CYANOCOBALAMIN 1000 MCG/ML IJ SOLN
1000.0000 ug | Freq: Once | INTRAMUSCULAR | Status: AC
  Administered 2019-06-12: 1000 ug via INTRAMUSCULAR

## 2019-06-12 NOTE — Progress Notes (Signed)
Per orders of Kate Clark, NP, injection of B12 given by Dominie Benedick. Patient tolerated injection well.  

## 2019-07-17 ENCOUNTER — Ambulatory Visit (INDEPENDENT_AMBULATORY_CARE_PROVIDER_SITE_OTHER): Payer: Medicare HMO

## 2019-07-17 ENCOUNTER — Other Ambulatory Visit: Payer: Self-pay

## 2019-07-17 ENCOUNTER — Telehealth: Payer: Self-pay

## 2019-07-17 DIAGNOSIS — E538 Deficiency of other specified B group vitamins: Secondary | ICD-10-CM | POA: Diagnosis not present

## 2019-07-17 MED ORDER — CYANOCOBALAMIN 1000 MCG/ML IJ SOLN
1000.0000 ug | Freq: Once | INTRAMUSCULAR | Status: AC
  Administered 2019-07-17: 1000 ug via INTRAMUSCULAR

## 2019-07-17 NOTE — Telephone Encounter (Signed)
Noted, will evaluate. 

## 2019-07-17 NOTE — Progress Notes (Signed)
Per orders of Allie Bossier, NP, injection of B12 given by Randall An. Patient tolerated injection well.

## 2019-07-17 NOTE — Telephone Encounter (Signed)
Pt in office today for [place] for B12 inj. Pt reports he passed a blood clot in his urine this morning. Denies pain or any difficulty urinating. Pt reports hx of one kidney stone in the past which he had surgery for. Pt requested office visit for tomorrow with PCP. Scheduled pt and advised if he has any difficulty urinating to contact this office of if he cannot urinate to go immediately to the ER. Pt verbalized understanding.

## 2019-07-18 ENCOUNTER — Other Ambulatory Visit: Payer: Self-pay | Admitting: Primary Care

## 2019-07-18 ENCOUNTER — Encounter: Payer: Self-pay | Admitting: Primary Care

## 2019-07-18 ENCOUNTER — Ambulatory Visit (INDEPENDENT_AMBULATORY_CARE_PROVIDER_SITE_OTHER): Payer: Medicare HMO | Admitting: Primary Care

## 2019-07-18 VITALS — BP 122/76 | HR 89 | Temp 95.9°F | Ht 60.0 in | Wt 140.0 lb

## 2019-07-18 DIAGNOSIS — R319 Hematuria, unspecified: Secondary | ICD-10-CM | POA: Diagnosis not present

## 2019-07-18 DIAGNOSIS — R351 Nocturia: Secondary | ICD-10-CM

## 2019-07-18 DIAGNOSIS — N401 Enlarged prostate with lower urinary tract symptoms: Secondary | ICD-10-CM

## 2019-07-18 LAB — POC URINALSYSI DIPSTICK (AUTOMATED)
Blood, UA: NEGATIVE
Glucose, UA: NEGATIVE
Nitrite, UA: NEGATIVE
Protein, UA: POSITIVE — AB
Spec Grav, UA: 1.025 (ref 1.010–1.025)
Urobilinogen, UA: 1 E.U./dL
pH, UA: 5.5 (ref 5.0–8.0)

## 2019-07-18 MED ORDER — MIRABEGRON ER 25 MG PO TB24: 25.0000 mg | ORAL_TABLET | Freq: Every day | ORAL | 0 refills | Status: DC

## 2019-07-18 NOTE — Patient Instructions (Addendum)
Stop taking tamsulosin (Flomax).  Start mirabegron (Myrbetriq) 25 mg once daily for urinary frequency.   We will be in touch regarding your urine culture results on Friday.  Try to drink some water during the day.  It was a pleasure to see you today!

## 2019-07-18 NOTE — Assessment & Plan Note (Addendum)
Acute episode occurring once yesterday. Doesn't seem to be having acute renal stones, UTI, acute prostatitis.   UA today negative for blood. Culture sent.  We discussed collecting a PSA, he is monitored by Urology and kindly declines PSA today. He is having LUTS.   Asymptomatic.  Will have him continue to monitor.

## 2019-07-18 NOTE — Progress Notes (Signed)
Subjective:    Patient ID: [name], male    DOB: [birth year], 86 y.o.   MRN: [id number]  HPI  This visit occurred during the SARS-CoV-2 public health emergency.  Safety protocols were in place, including screening questions prior to the visit, additional usage of staff PPE, and extensive cleaning of exam room while observing appropriate contact time as indicated for disinfecting solutions.   [name] is a 86 year old male with a history of hypertension, BPH, pernicious anemia, B12 deficiency, hyperlipidemia, elevated PSA who presents today with a chief complaint of hematuria.   Yesterday morning he was urinating and noticed a soft, mucous, bright red, blood clot. The clot was about the size of the tip of his pinky finger. He denies pain, difficulty urinating, groin pain. He does continue to notice nocturia during the night. He is compliant to his Flomax nightly.   He does not drink water as it makes him nauseated. He drinks mostly Cheerwine, Peach Flavored Tea, [employer].   Previously following with Urology, last evaluated one year ago virtually.   BP Readings from Last 3 Encounters:  07/18/19 122/76  11/30/18 (!) 154/60  11/09/18 137/64     Review of Systems  Constitutional: Negative for fever.  Genitourinary: Positive for frequency and hematuria. Negative for difficulty urinating, dysuria and urgency.       Nocturia.       Past Medical History:  Diagnosis Date  . Arthralgia of multiple joints    limited mobility w/  independant adl's  . BPH with obstruction/lower urinary tract symptoms   . Chronic idiopathic monocytosis    followed by dr Waymon Budge--  per lov note 06/ 2017 persistant unexplained  with normal bone barrow bx  . Coronary atherosclerosis of unspecified type of vessel, native or graft    cardiologist-  dr Stanford Breed -- per lov note 11-19-2014 ,  cardiac cath in 2000-- pLAD 60-70%,  small intermediate branch 70-80%,  mRCA 20%,  normal LV  .  Degenerative arthritis of spine    cervical and lumar  . Diverticulosis of colon (without mention of hemorrhage)   . Dysuria    chronic  . Elevated PSA   . Feeling of incomplete bladder emptying   . Hiatal hernia    moderate per ct 11/ 2017  . History of adenomatous polyp of colon    2008  . History of atrial fibrillation    remote hx episode  . History of pancreatitis    07-01-2013  and 10-10-2015  . History of prostatitis    2014  . Hx of colonic polyps   . Hyperlipidemia   . Hypertension   . Mouth sore    roof of mouth sore   . Nephrolithiasis   . Nocturia more than twice per night    severe  w/ leakage  . OSA (obstructive sleep apnea)    INTOLERANT CPAP  . Osteoarthritis   . Pernicious anemia    B12  Def.  . Peyronie disease   . Renal insufficiency   . Sepsis ([place]) 03/22/2017  . Thrombocytopenia, unspecified ([place]) hemotology/oncologist-  dr Waymon Budge   per dr Velta Addison note 06/ 2016  secondary to vitro clumping     Social History   Socioeconomic History  . Marital status: Widowed    Spouse name: Not on file  . Number of children: 2  . Years of education: Not on file  . Highest education level: Not on file  Occupational History  .  Occupation: retired    Fish farm manager: RETIRED  Tobacco Use  . Smoking status: Former Smoker    Types: Pipe    Quit date: 01/18/1966    Years since quitting: 53.5  . Smokeless tobacco: Never Used  Vaping Use  . Vaping Use: Never used  Substance and Sexual Activity  . Alcohol use: No    Alcohol/week: 0.0 standard drinks  . Drug use: No  . Sexual activity: Not Currently  Other Topics Concern  . Not on file  Social History Narrative   DNR   Widowed   2 daughters leave near by   Social Determinants of Health   Financial Resource Strain:   . Difficulty of Paying Living Expenses:   Food Insecurity:   . Worried About Charity fundraiser in the Last Year:   . Arboriculturist in the Last Year:   Transportation Needs:   .  Film/video editor (Medical):   Marland Kitchen Lack of Transportation (Non-Medical):   Physical Activity:   . Days of Exercise per Week:   . Minutes of Exercise per Session:   Stress:   . Feeling of Stress :   Social Connections:   . Frequency of Communication with Friends and Family:   . Frequency of Social Gatherings with Friends and Family:   . Attends Religious Services:   . Active Member of Clubs or Organizations:   . Attends Archivist Meetings:   Marland Kitchen Marital Status:   Intimate Partner Violence:   . Fear of Current or Ex-Partner:   . Emotionally Abused:   Marland Kitchen Physically Abused:   . Sexually Abused:     Past Surgical History:  Procedure Laterality Date  . BONE MARROW BIOPSY  2011   normal  . CARDIAC CATHETERIZATION  2000   per dr Kathyrn Drown note --  dLAD 60-70%,  small intermediate branch 70-80%,  mRCA 20%,  normal LV  . CARDIOVASCULAR STRESS TEST  01-29-2013   dr Stanford Breed   normal nuclear study w/ no ischemia,  normal LV function and wall motion , ef 82%  . CATARACT EXTRACTION W/ INTRAOCULAR LENS  IMPLANT, BILATERAL  08/2015  . CHOLECYSTECTOMY  11/24/2010   Procedure: LAPAROSCOPIC CHOLECYSTECTOMY WITH INTRAOPERATIVE CHOLANGIOGRAM;  Surgeon: Earnstine Regal, MD;  Location: [facility];  Service: General;  Laterality: N/A;  c-arm  . CYSTOSCOPY W/ URETERAL STENT PLACEMENT Left 12/17/2015   Procedure: CYSTOSCOPY WITH RETROGRADE PYELOGRAM/URETERAL STENT PLACEMENT;  Surgeon: Ardis Hughs, MD;  Location: [facility];  Service: Urology;  Laterality: Left;  . CYSTOSCOPY WITH RETROGRADE PYELOGRAM, URETEROSCOPY AND STENT PLACEMENT Left 12/22/2015   Procedure: CYSTOSCOPY WITH LEFT RETROGRADE  URETEROSCOPY AND STENT PLACEMENT;  Surgeon: Carolan Clines, MD;  Location: [facility];  Service: Urology;  Laterality: Left;  . ERCP N/A 07/04/2013   Procedure: ENDOSCOPIC RETROGRADE CHOLANGIOPANCREATOGRAPHY (ERCP);  Surgeon: Gatha Mayer, MD;  Location: [facility];  Service:  Endoscopy;  Laterality: N/A;  MAC if available  . ERCP N/A 10/12/2015   Procedure: ENDOSCOPIC RETROGRADE CHOLANGIOPANCREATOGRAPHY (ERCP);  Surgeon: [name], MD;  Location: [facility];  Service: Endoscopy;  Laterality: N/A;  . HOLMIUM LASER APPLICATION Left 68/01/2749   Procedure: HOLMIUM LASER APPLICATION;  Surgeon: Carolan Clines, MD;  Location: [facility];  Service: Urology;  Laterality: Left;  . INGUINAL HERNIA REPAIR Right 01/25/2002  . KNEE ARTHROSCOPY  x5  . SATURATION BIOPSY OF PROSTATE  05-29-2007  and 01-26-2008  . SHOULDER ARTHROSCOPY WITH OPEN ROTATOR CUFF REPAIR  AND DISTAL CLAVICLE ACROMINECTOMY Right 11/19/2004  . TOTAL HIP ARTHROPLASTY Right 05/21/2009  . TOTAL KNEE ARTHROPLASTY Bilateral left 05-02-2003/  right  03-23-2010  . TRANSTHORACIC ECHOCARDIOGRAM  12/23/2004   ef 60%, mild MV calcification without stenosis/  mild TR,  PASP 23mHg  . UVULOPALATOPHARYNGOPLASTY  1993    w/  T & A    Family History  Problem Relation Age of Onset  . Colon cancer Father 451 . Peripheral vascular disease Mother     Allergies  Allergen Reactions  . Penicillins Hives and Other (See Comments)    Whelps, passed out Tolerates cephalosporins  Has patient had a PCN reaction causing immediate rash, facial/tongue/throat swelling, SOB or lightheadedness with hypotension:  yes Has patient had a PCN reaction causing severe rash involving mucus membranes or skin necrosis: no Has patient had a PCN reaction that required hospitalization: no Has patient had a PCN reaction occurring within the last 10 years: no If all of the above answers are "NO", then may proceed with Cephalosporin use.     Current Outpatient Medications on File Prior to Visit  Medication Sig Dispense Refill  . acetaminophen (TYLENOL) 325 MG tablet Take 650 mg by mouth every 6 (six) hours as needed for mild pain.    . Artificial Tear Solution (TEARS NATURALE OP) Place 1-2 drops into both eyes at bedtime  as needed (dry eyes).    . fluticasone (FLONASE) 50 MCG/ACT nasal spray Place 1 spray into both nostrils as needed for allergies or rhinitis.    . Loratadine (CLARITIN PO) Take 1 tablet by mouth daily.     . Multiple Vitamin (MULTIVITAMIN) tablet Take 1 tablet by mouth daily.    . tamsulosin (FLOMAX) 0.4 MG CAPS capsule TAKE 1 CAPSULE BY MOUTH ONCE DAILY AFTERSUPPER FOR ENLARGED PROSTATE 90 capsule 1   No current facility-administered medications on file prior to visit.    BP 122/76   Pulse 89   Temp (!) 95.9 F (35.5 C) (Temporal)   Ht 5' (1.524 m)   Wt 140 lb (63.5 kg)   SpO2 97%   BMI 27.34 kg/m    Objective:   Physical Exam Cardiovascular:     Rate and Rhythm: Normal rate and regular rhythm.  Pulmonary:     Effort: Pulmonary effort is normal.     Breath sounds: Normal breath sounds.  Skin:    General: Skin is warm and dry.  Neurological:     Mental Status: He is alert.            Assessment & Plan:

## 2019-07-18 NOTE — Assessment & Plan Note (Signed)
Flomax seems to be ineffective as he continues to get up during the night.  Stop Flomax, start Myrbetriq.  He will call with an update in a few weeks.

## 2019-07-19 ENCOUNTER — Other Ambulatory Visit: Payer: Medicare HMO

## 2019-07-19 LAB — URINE CULTURE
MICRO NUMBER:: 10652817
SPECIMEN QUALITY:: ADEQUATE

## 2019-08-21 DIAGNOSIS — L539 Erythematous condition, unspecified: Secondary | ICD-10-CM | POA: Diagnosis not present

## 2019-08-21 DIAGNOSIS — T63484A Toxic effect of venom of other arthropod, undetermined, initial encounter: Secondary | ICD-10-CM | POA: Diagnosis not present

## 2019-08-28 ENCOUNTER — Ambulatory Visit (INDEPENDENT_AMBULATORY_CARE_PROVIDER_SITE_OTHER): Payer: Medicare HMO | Admitting: *Deleted

## 2019-08-28 ENCOUNTER — Other Ambulatory Visit: Payer: Self-pay

## 2019-08-28 ENCOUNTER — Telehealth: Payer: Self-pay

## 2019-08-28 DIAGNOSIS — E538 Deficiency of other specified B group vitamins: Secondary | ICD-10-CM | POA: Diagnosis not present

## 2019-08-28 MED ORDER — CYANOCOBALAMIN 1000 MCG/ML IJ SOLN
1000.0000 ug | Freq: Once | INTRAMUSCULAR | Status: AC
  Administered 2019-08-28: 1000 ug via INTRAMUSCULAR

## 2019-08-28 NOTE — Progress Notes (Signed)
Per orders of Webb Silversmith, NP Allie Bossier, NP) injection of B12 1000 mcg/mL given by Kristiana Jacko. Patient tolerated injection well.

## 2019-08-28 NOTE — Telephone Encounter (Signed)
Pt left v/m that pt is out in front of office to get B 12 shot. Per clinical note pt has already gotten B 12 shot. Nothing further needed.

## 2019-09-07 ENCOUNTER — Encounter: Payer: Self-pay | Admitting: Primary Care

## 2019-09-07 ENCOUNTER — Other Ambulatory Visit: Payer: Self-pay

## 2019-09-07 ENCOUNTER — Ambulatory Visit (INDEPENDENT_AMBULATORY_CARE_PROVIDER_SITE_OTHER): Payer: Medicare HMO | Admitting: Primary Care

## 2019-09-07 VITALS — BP 114/62 | HR 76 | Temp 95.7°F | Ht 60.0 in | Wt 140.2 lb

## 2019-09-07 DIAGNOSIS — G4730 Sleep apnea, unspecified: Secondary | ICD-10-CM | POA: Diagnosis not present

## 2019-09-07 DIAGNOSIS — R351 Nocturia: Secondary | ICD-10-CM | POA: Diagnosis not present

## 2019-09-07 DIAGNOSIS — I2510 Atherosclerotic heart disease of native coronary artery without angina pectoris: Secondary | ICD-10-CM | POA: Diagnosis not present

## 2019-09-07 DIAGNOSIS — N401 Enlarged prostate with lower urinary tract symptoms: Secondary | ICD-10-CM

## 2019-09-07 DIAGNOSIS — E538 Deficiency of other specified B group vitamins: Secondary | ICD-10-CM | POA: Diagnosis not present

## 2019-09-07 DIAGNOSIS — E785 Hyperlipidemia, unspecified: Secondary | ICD-10-CM | POA: Diagnosis not present

## 2019-09-07 DIAGNOSIS — D696 Thrombocytopenia, unspecified: Secondary | ICD-10-CM | POA: Diagnosis not present

## 2019-09-07 DIAGNOSIS — R5382 Chronic fatigue, unspecified: Secondary | ICD-10-CM

## 2019-09-07 DIAGNOSIS — I10 Essential (primary) hypertension: Secondary | ICD-10-CM

## 2019-09-07 DIAGNOSIS — D510 Vitamin B12 deficiency anemia due to intrinsic factor deficiency: Secondary | ICD-10-CM | POA: Diagnosis not present

## 2019-09-07 LAB — CBC
HCT: 46.5 % (ref 39.0–52.0)
Hemoglobin: 15.5 g/dL (ref 13.0–17.0)
MCHC: 33.4 g/dL (ref 30.0–36.0)
MCV: 83.9 fl (ref 78.0–100.0)
Platelets: 99 10*3/uL — ABNORMAL LOW (ref 150.0–400.0)
RBC: 5.55 Mil/uL (ref 4.22–5.81)
RDW: 15.1 % (ref 11.5–15.5)
WBC: 7.3 10*3/uL (ref 4.0–10.5)

## 2019-09-07 LAB — LIPID PANEL
Cholesterol: 127 mg/dL (ref 0–200)
HDL: 33.8 mg/dL — ABNORMAL LOW (ref >39.00)
LDL Cholesterol: 73 mg/dL (ref 0–99)
NonHDL: 93.19
Total CHOL/HDL Ratio: 4
Triglycerides: 101 mg/dL (ref 0.0–149.0)
VLDL: 20.2 mg/dL (ref 0.0–40.0)

## 2019-09-07 LAB — TSH: TSH: 0.95 u[IU]/mL (ref 0.35–4.50)

## 2019-09-07 LAB — COMPREHENSIVE METABOLIC PANEL
ALT: 12 U/L (ref 0–53)
AST: 16 U/L (ref 0–37)
Albumin: 4.1 g/dL (ref 3.5–5.2)
Alkaline Phosphatase: 85 U/L (ref 39–117)
BUN: 11 mg/dL (ref 6–23)
CO2: 29 mEq/L (ref 19–32)
Calcium: 9.2 mg/dL (ref 8.4–10.5)
Chloride: 109 mEq/L (ref 96–112)
Creatinine, Ser: 0.93 mg/dL (ref 0.40–1.50)
GFR: 77.42 mL/min (ref >60.00)
Glucose, Bld: 92 mg/dL (ref 70–99)
Potassium: 4.6 mEq/L (ref 3.5–5.1)
Sodium: 144 mEq/L (ref 135–145)
Total Bilirubin: 0.6 mg/dL (ref 0.2–1.2)
Total Protein: 5.8 g/dL — ABNORMAL LOW (ref 6.0–8.3)

## 2019-09-07 LAB — VITAMIN D 25 HYDROXY (VIT D DEFICIENCY, FRACTURES): VITD: 43.23 ng/mL (ref 30.00–100.00)

## 2019-09-07 LAB — VITAMIN B12: Vitamin B-12: 567 pg/mL (ref 211–911)

## 2019-09-07 LAB — PSA, MEDICARE: PSA: 6.73 ng/ml — ABNORMAL HIGH (ref 0.10–4.00)

## 2019-09-07 NOTE — Assessment & Plan Note (Signed)
Not on treatment. ? ?Repeat lipid panel pending. ?

## 2019-09-07 NOTE — Assessment & Plan Note (Signed)
Chronic, repeat CBC pending.

## 2019-09-07 NOTE — Assessment & Plan Note (Signed)
Well controlled in the office off of treatment. Continue to monitor.

## 2019-09-07 NOTE — Progress Notes (Signed)
Subjective:    Patient ID: [name], male    DOB: 09-12-1933, 86 y.o.   MRN: [id number]  HPI  This visit occurred during the SARS-CoV-2 public health emergency.  Safety protocols were in place, including screening questions prior to the visit, additional usage of staff PPE, and extensive cleaning of exam room while observing appropriate contact time as indicated for disinfecting solutions.   [name] is an 86 year old male with a history of hypertension, CAD, sleep apnea, BPH, pernicious anemia, hyperlipidemia, thrombocytopenia, depression who presents today with a chief complaint of fatigue.  He endorses very low energy when he wakes in the morning. 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   He has been waking three times nightly to urinate for years, once saw Urology who he's been following for years. He was contacted recently for an appointment but he has not yet scheduled. He could not afford the Myrbetriq so he's been taking an OTC supplement recently with some improvement. He has found that he's getting up less frequently.   He is mostly consistent with his monthly B12 injections. Last B12 lab was 688 in November 2020. He has noticed that his urine changes color from dark yellow to lighter yellow.   He drinks mostly [employer] [place] Sweet Tea/Lemonade, no water typically. He snores, talks in his sleep, has been diagnosed with sleep apnea in the past but does not wear a CPAP machine due to intolerance. He's tried several masks in the past and cannot tolerate any.  Review of Systems  Constitutional: Negative for fever and unexpected weight change.  Respiratory: Negative for shortness of breath.   Cardiovascular: Negative for chest pain.  Gastrointestinal: Negative for blood in stool.  Genitourinary: Positive for frequency. Negative for hematuria.       Nocturia        Past Medical  History:  Diagnosis Date  . Arthralgia of multiple joints    limited mobility w/  independant adl's  . BPH with obstruction/lower urinary tract symptoms   . Chronic idiopathic monocytosis    followed by dr Waymon Budge--  per lov note 06/ 2017 persistant unexplained  with normal bone barrow bx  . Coronary atherosclerosis of unspecified type of vessel, native or graft    cardiologist-  dr Stanford Breed -- per lov note 11-19-2014 ,  cardiac cath in 2000-- pLAD 60-70%,  small intermediate branch 70-80%,  mRCA 20%,  normal LV  . Degenerative arthritis of spine    cervical and lumar  . Diverticulosis of colon (without mention of hemorrhage)   . Dysuria    chronic  . Elevated PSA   . Feeling of incomplete bladder emptying   . Hiatal hernia    moderate per ct 11/ 2017  . History of adenomatous polyp of colon    2008  . History of atrial fibrillation    remote hx episode  . History of pancreatitis    07-01-2013  and 10-10-2015  . History of prostatitis    2014  . Hx of colonic polyps   . Hyperlipidemia   . Hypertension   . Mouth sore    roof of mouth sore   . Nephrolithiasis   . Nocturia more than twice per night    severe  w/ leakage  . OSA (obstructive sleep apnea)    INTOLERANT CPAP  . Osteoarthritis   .  Pernicious anemia    B12  Def.  . Peyronie disease   . Renal insufficiency   . Sepsis ([place]) 03/22/2017  . Thrombocytopenia, unspecified ([place]) hemotology/oncologist-  dr Waymon Budge   per dr Velta Addison note 06/ 2016  secondary to vitro clumping     Social History   Socioeconomic History  . Marital status: Widowed    Spouse name: Not on file  . Number of children: 2  . Years of education: Not on file  . Highest education level: Not on file  Occupational History  . Occupation: retired    Fish farm manager: RETIRED  Tobacco Use  . Smoking status: Former Smoker    Types: Pipe    Quit date: 01/18/1966    Years since quitting: 53.6  . Smokeless tobacco: Never Used  Vaping Use    . Vaping Use: Never used  Substance and Sexual Activity  . Alcohol use: No    Alcohol/week: 0.0 standard drinks  . Drug use: No  . Sexual activity: Not Currently  Other Topics Concern  . Not on file  Social History Narrative   DNR   Widowed   2 daughters leave near by   Social Determinants of Health   Financial Resource Strain:   . Difficulty of Paying Living Expenses: Not on file  Food Insecurity:   . Worried About Charity fundraiser in the Last Year: Not on file  . Ran Out of Food in the Last Year: Not on file  Transportation Needs:   . Lack of Transportation (Medical): Not on file  . Lack of Transportation (Non-Medical): Not on file  Physical Activity:   . Days of Exercise per Week: Not on file  . Minutes of Exercise per Session: Not on file  Stress:   . Feeling of Stress : Not on file  Social Connections:   . Frequency of Communication with Friends and Family: Not on file  . Frequency of Social Gatherings with Friends and Family: Not on file  . Attends Religious Services: Not on file  . Active Member of Clubs or Organizations: Not on file  . Attends Archivist Meetings: Not on file  . Marital Status: Not on file  Intimate Partner Violence:   . Fear of Current or Ex-Partner: Not on file  . Emotionally Abused: Not on file  . Physically Abused: Not on file  . Sexually Abused: Not on file    Past Surgical History:  Procedure Laterality Date  . BONE MARROW BIOPSY  2011   normal  . CARDIAC CATHETERIZATION  2000   per dr Kathyrn Drown note --  dLAD 60-70%,  small intermediate branch 70-80%,  mRCA 20%,  normal LV  . CARDIOVASCULAR STRESS TEST  01-29-2013   dr Stanford Breed   normal nuclear study w/ no ischemia,  normal LV function and wall motion , ef 82%  . CATARACT EXTRACTION W/ INTRAOCULAR LENS  IMPLANT, BILATERAL  08/2015  . CHOLECYSTECTOMY  11/24/2010   Procedure: LAPAROSCOPIC CHOLECYSTECTOMY WITH INTRAOPERATIVE CHOLANGIOGRAM;  Surgeon: Earnstine Regal, MD;   Location: [facility];  Service: General;  Laterality: N/A;  c-arm  . CYSTOSCOPY W/ URETERAL STENT PLACEMENT Left 12/17/2015   Procedure: CYSTOSCOPY WITH RETROGRADE PYELOGRAM/URETERAL STENT PLACEMENT;  Surgeon: Ardis Hughs, MD;  Location: [facility];  Service: Urology;  Laterality: Left;  . CYSTOSCOPY WITH RETROGRADE PYELOGRAM, URETEROSCOPY AND STENT PLACEMENT Left 12/22/2015   Procedure: CYSTOSCOPY WITH LEFT RETROGRADE  URETEROSCOPY AND STENT PLACEMENT;  Surgeon: Carolan Clines, MD;  Location:  [facility];  Service: Urology;  Laterality: Left;  . ERCP N/A 07/04/2013   Procedure: ENDOSCOPIC RETROGRADE CHOLANGIOPANCREATOGRAPHY (ERCP);  Surgeon: Gatha Mayer, MD;  Location: [facility];  Service: Endoscopy;  Laterality: N/A;  MAC if available  . ERCP N/A 10/12/2015   Procedure: ENDOSCOPIC RETROGRADE CHOLANGIOPANCREATOGRAPHY (ERCP);  Surgeon: [name], MD;  [facility]: [facility];  Service: Endoscopy;  Laterality: N/A;  . HOLMIUM LASER APPLICATION Left 29/05/6211   Procedure: HOLMIUM LASER APPLICATION;  Surgeon: Carolan Clines, MD;  Location: [facility];  Service: Urology;  Laterality: Left;  . INGUINAL HERNIA REPAIR Right 01/25/2002  . KNEE ARTHROSCOPY  x5  . SATURATION BIOPSY OF PROSTATE  05-29-2007  and 01-26-2008  . SHOULDER ARTHROSCOPY WITH OPEN ROTATOR CUFF REPAIR AND DISTAL CLAVICLE ACROMINECTOMY Right 11/19/2004  . TOTAL HIP ARTHROPLASTY Right 05/21/2009  . TOTAL KNEE ARTHROPLASTY Bilateral left 05-02-2003/  right  03-23-2010  . TRANSTHORACIC ECHOCARDIOGRAM  12/23/2004   ef 60%, mild MV calcification without stenosis/  mild TR,  PASP 39mHg  . UVULOPALATOPHARYNGOPLASTY  1993    w/  T & A    Family History  Problem Relation Age of Onset  . Colon cancer Father 450 . Peripheral vascular disease Mother     Allergies  Allergen Reactions  . Penicillins Hives and Other (See Comments)    Whelps, passed out Tolerates cephalosporins  Has patient had  a PCN reaction causing immediate rash, facial/tongue/throat swelling, SOB or lightheadedness with hypotension:  yes Has patient had a PCN reaction causing severe rash involving mucus membranes or skin necrosis: no Has patient had a PCN reaction that required hospitalization: no Has patient had a PCN reaction occurring within the last 10 years: no If all of the above answers are "NO", then may proceed with Cephalosporin use.     Current Outpatient Medications on File Prior to Visit  Medication Sig Dispense Refill  . acetaminophen (TYLENOL) 325 MG tablet Take 650 mg by mouth every 6 (six) hours as needed for mild pain.    . Artificial Tear Solution (TEARS NATURALE OP) Place 1-2 drops into both eyes at bedtime as needed (dry eyes).    . fluticasone (FLONASE) 50 MCG/ACT nasal spray Place 1 spray into both nostrils as needed for allergies or rhinitis.    . Loratadine (CLARITIN PO) Take 1 tablet by mouth daily.     . Multiple Vitamin (MULTIVITAMIN) tablet Take 1 tablet by mouth daily.     No current facility-administered medications on file prior to visit.    BP 114/62   Pulse 76   Temp (!) 95.7 F (35.4 C) (Temporal)   Ht 5' (1.524 m)   Wt 140 lb 4 oz (63.6 kg)   SpO2 98%   BMI 27.39 kg/m    Objective:   Physical Exam Cardiovascular:     Rate and Rhythm: Normal rate and regular rhythm.  Pulmonary:     Effort: Pulmonary effort is normal.     Breath sounds: Normal breath sounds.  Musculoskeletal:     Cervical back: Neck supple.  Skin:    General: Skin is warm and dry.  Psychiatric:        Mood and Affect: Mood normal.            Assessment & Plan:

## 2019-09-07 NOTE — Assessment & Plan Note (Signed)
Compliant to monthly B12 injections.  Repeat CBC and B12 level pending.

## 2019-09-07 NOTE — Assessment & Plan Note (Signed)
Could not afford Myrbetriq, but has found some improvement with OTC product. I encouraged him to follow up with Urology who may have samples. PSA pending.

## 2019-09-07 NOTE — Assessment & Plan Note (Addendum)
Chronic for years, suspect multiple causes including OSA, depression, no water intake, inactivity. Discussed all of this today.  Will proceed with lab testing today, he kindly declines CPAP machine. Encouraged regular activity, start drinking water.

## 2019-09-07 NOTE — Assessment & Plan Note (Signed)
Asymptomatic, repeat lipid panel pending.

## 2019-09-07 NOTE — Patient Instructions (Signed)
Stop by the lab prior to leaving today. I will notify you of your results once received.   Consider scheduling an appointment with your Urologist as discussed.  It was a pleasure to see you today!

## 2019-09-07 NOTE — Assessment & Plan Note (Signed)
Suspect this may be contributing to the majority of his fatigue, discussed that CPAP utilization may help. He kindly declines.

## 2019-09-10 ENCOUNTER — Encounter: Payer: Self-pay | Admitting: Primary Care

## 2019-09-10 DIAGNOSIS — N401 Enlarged prostate with lower urinary tract symptoms: Secondary | ICD-10-CM | POA: Diagnosis not present

## 2019-09-10 DIAGNOSIS — R1084 Generalized abdominal pain: Secondary | ICD-10-CM | POA: Diagnosis not present

## 2019-09-10 DIAGNOSIS — R3915 Urgency of urination: Secondary | ICD-10-CM | POA: Diagnosis not present

## 2019-09-10 DIAGNOSIS — Z87442 Personal history of urinary calculi: Secondary | ICD-10-CM | POA: Diagnosis not present

## 2019-09-14 DIAGNOSIS — R310 Gross hematuria: Secondary | ICD-10-CM | POA: Diagnosis not present

## 2019-09-14 DIAGNOSIS — N400 Enlarged prostate without lower urinary tract symptoms: Secondary | ICD-10-CM | POA: Diagnosis not present

## 2019-09-14 DIAGNOSIS — R300 Dysuria: Secondary | ICD-10-CM | POA: Diagnosis not present

## 2019-09-28 DIAGNOSIS — M545 Low back pain: Secondary | ICD-10-CM | POA: Diagnosis not present

## 2019-09-28 DIAGNOSIS — R2681 Unsteadiness on feet: Secondary | ICD-10-CM | POA: Diagnosis not present

## 2019-09-28 DIAGNOSIS — M6281 Muscle weakness (generalized): Secondary | ICD-10-CM | POA: Diagnosis not present

## 2019-09-28 DIAGNOSIS — M419 Scoliosis, unspecified: Secondary | ICD-10-CM | POA: Diagnosis not present

## 2019-10-05 DIAGNOSIS — M419 Scoliosis, unspecified: Secondary | ICD-10-CM | POA: Diagnosis not present

## 2019-10-05 DIAGNOSIS — M6281 Muscle weakness (generalized): Secondary | ICD-10-CM | POA: Diagnosis not present

## 2019-10-05 DIAGNOSIS — R2681 Unsteadiness on feet: Secondary | ICD-10-CM | POA: Diagnosis not present

## 2019-10-09 DIAGNOSIS — M419 Scoliosis, unspecified: Secondary | ICD-10-CM | POA: Diagnosis not present

## 2019-10-09 DIAGNOSIS — R310 Gross hematuria: Secondary | ICD-10-CM | POA: Diagnosis not present

## 2019-10-09 DIAGNOSIS — R2681 Unsteadiness on feet: Secondary | ICD-10-CM | POA: Diagnosis not present

## 2019-10-09 DIAGNOSIS — R3912 Poor urinary stream: Secondary | ICD-10-CM | POA: Diagnosis not present

## 2019-10-09 DIAGNOSIS — M6281 Muscle weakness (generalized): Secondary | ICD-10-CM | POA: Diagnosis not present

## 2019-10-12 DIAGNOSIS — R2681 Unsteadiness on feet: Secondary | ICD-10-CM | POA: Diagnosis not present

## 2019-10-12 DIAGNOSIS — M419 Scoliosis, unspecified: Secondary | ICD-10-CM | POA: Diagnosis not present

## 2019-10-12 DIAGNOSIS — M6281 Muscle weakness (generalized): Secondary | ICD-10-CM | POA: Diagnosis not present

## 2019-10-17 DIAGNOSIS — M6281 Muscle weakness (generalized): Secondary | ICD-10-CM | POA: Diagnosis not present

## 2019-10-17 DIAGNOSIS — M419 Scoliosis, unspecified: Secondary | ICD-10-CM | POA: Diagnosis not present

## 2019-10-17 DIAGNOSIS — R2681 Unsteadiness on feet: Secondary | ICD-10-CM | POA: Diagnosis not present

## 2019-10-19 DIAGNOSIS — M419 Scoliosis, unspecified: Secondary | ICD-10-CM | POA: Diagnosis not present

## 2019-10-19 DIAGNOSIS — R2681 Unsteadiness on feet: Secondary | ICD-10-CM | POA: Diagnosis not present

## 2019-10-19 DIAGNOSIS — M6281 Muscle weakness (generalized): Secondary | ICD-10-CM | POA: Diagnosis not present

## 2019-10-23 DIAGNOSIS — R2681 Unsteadiness on feet: Secondary | ICD-10-CM | POA: Diagnosis not present

## 2019-10-23 DIAGNOSIS — M6281 Muscle weakness (generalized): Secondary | ICD-10-CM | POA: Diagnosis not present

## 2019-10-23 DIAGNOSIS — M419 Scoliosis, unspecified: Secondary | ICD-10-CM | POA: Diagnosis not present

## 2019-10-25 DIAGNOSIS — M6281 Muscle weakness (generalized): Secondary | ICD-10-CM | POA: Diagnosis not present

## 2019-10-25 DIAGNOSIS — R2681 Unsteadiness on feet: Secondary | ICD-10-CM | POA: Diagnosis not present

## 2019-10-25 DIAGNOSIS — M419 Scoliosis, unspecified: Secondary | ICD-10-CM | POA: Diagnosis not present

## 2019-10-29 DIAGNOSIS — M6281 Muscle weakness (generalized): Secondary | ICD-10-CM | POA: Diagnosis not present

## 2019-10-29 DIAGNOSIS — M419 Scoliosis, unspecified: Secondary | ICD-10-CM | POA: Diagnosis not present

## 2019-10-29 DIAGNOSIS — R2681 Unsteadiness on feet: Secondary | ICD-10-CM | POA: Diagnosis not present

## 2019-11-01 DIAGNOSIS — M6281 Muscle weakness (generalized): Secondary | ICD-10-CM | POA: Diagnosis not present

## 2019-11-01 DIAGNOSIS — M419 Scoliosis, unspecified: Secondary | ICD-10-CM | POA: Diagnosis not present

## 2019-11-01 DIAGNOSIS — R2681 Unsteadiness on feet: Secondary | ICD-10-CM | POA: Diagnosis not present

## 2019-11-05 ENCOUNTER — Telehealth: Payer: Self-pay | Admitting: Primary Care

## 2019-11-05 NOTE — Telephone Encounter (Signed)
Please advise 

## 2019-11-05 NOTE — Telephone Encounter (Signed)
Pt called stated he has had both covid vaccines  And wanted to know when he can get his booster vaccine.  Both were pfyser He did have card with him to let me know when he received his vaccines He will call back with dates

## 2019-11-05 NOTE — Telephone Encounter (Signed)
Please notify patient that he can receive his Pfizer COVID booster vaccine 6 months after his second COVID-19 vaccine.  Please have him verify that he had [place].  He can sign up on the [facility] website or go to a local pharmacy for the booster.

## 2019-11-06 NOTE — Telephone Encounter (Signed)
Called patient reviewed all information and repeated back to me. Will call if any questions.  He will go to pharmacy to have booster when gets and let our office know.

## 2019-11-08 ENCOUNTER — Other Ambulatory Visit: Payer: Self-pay

## 2019-11-08 ENCOUNTER — Ambulatory Visit (INDEPENDENT_AMBULATORY_CARE_PROVIDER_SITE_OTHER): Payer: Medicare HMO

## 2019-11-08 DIAGNOSIS — E538 Deficiency of other specified B group vitamins: Secondary | ICD-10-CM

## 2019-11-08 MED ORDER — CYANOCOBALAMIN 1000 MCG/ML IJ SOLN
1000.0000 ug | Freq: Once | INTRAMUSCULAR | Status: AC
  Administered 2019-11-08: 1000 ug via INTRAMUSCULAR

## 2019-11-08 NOTE — Progress Notes (Signed)
Per orders of Katherine Clark, NP, injection of vit B12 given by Diamante Rubin. Patient tolerated injection well.  

## 2020-01-01 DIAGNOSIS — Z20822 Contact with and (suspected) exposure to covid-19: Secondary | ICD-10-CM | POA: Diagnosis not present

## 2020-01-01 DIAGNOSIS — J069 Acute upper respiratory infection, unspecified: Secondary | ICD-10-CM | POA: Diagnosis not present

## 2020-01-09 ENCOUNTER — Telehealth: Payer: Self-pay

## 2020-01-09 ENCOUNTER — Ambulatory Visit: Payer: Medicare HMO

## 2020-01-09 NOTE — Telephone Encounter (Signed)
Called and spoke with patient regarding his nurse visit appointment for his B12 injection. Patient stated that he thought it was changed to 12/30. Patient stated that he would not be able to come in today. Offered to reschedule patient, but he stated that he would call back and make another appointment.

## 2020-01-10 ENCOUNTER — Other Ambulatory Visit: Payer: Self-pay

## 2020-01-10 ENCOUNTER — Ambulatory Visit (INDEPENDENT_AMBULATORY_CARE_PROVIDER_SITE_OTHER): Payer: Medicare HMO | Admitting: *Deleted

## 2020-01-10 DIAGNOSIS — E538 Deficiency of other specified B group vitamins: Secondary | ICD-10-CM

## 2020-01-10 MED ORDER — CYANOCOBALAMIN 1000 MCG/ML IJ SOLN
1000.0000 ug | Freq: Once | INTRAMUSCULAR | Status: AC
  Administered 2020-01-10: 09:00:00 1000 ug via INTRAMUSCULAR

## 2020-01-10 NOTE — Progress Notes (Signed)
Per orders of Silvestre Moment injection of Vitamin B12 given by Lauralyn Primes. Patient tolerated injection well.

## 2020-01-24 ENCOUNTER — Ambulatory Visit (INDEPENDENT_AMBULATORY_CARE_PROVIDER_SITE_OTHER): Payer: Medicare HMO | Admitting: Primary Care

## 2020-01-24 ENCOUNTER — Other Ambulatory Visit: Payer: Self-pay

## 2020-01-24 VITALS — BP 138/70 | HR 88 | Temp 98.3°F | Ht 60.0 in | Wt 144.8 lb

## 2020-01-24 DIAGNOSIS — N401 Enlarged prostate with lower urinary tract symptoms: Secondary | ICD-10-CM

## 2020-01-24 DIAGNOSIS — E785 Hyperlipidemia, unspecified: Secondary | ICD-10-CM | POA: Diagnosis not present

## 2020-01-24 DIAGNOSIS — D696 Thrombocytopenia, unspecified: Secondary | ICD-10-CM

## 2020-01-24 DIAGNOSIS — Z0000 Encounter for general adult medical examination without abnormal findings: Secondary | ICD-10-CM | POA: Diagnosis not present

## 2020-01-24 DIAGNOSIS — R5382 Chronic fatigue, unspecified: Secondary | ICD-10-CM

## 2020-01-24 DIAGNOSIS — J309 Allergic rhinitis, unspecified: Secondary | ICD-10-CM | POA: Diagnosis not present

## 2020-01-24 DIAGNOSIS — I2510 Atherosclerotic heart disease of native coronary artery without angina pectoris: Secondary | ICD-10-CM

## 2020-01-24 DIAGNOSIS — D510 Vitamin B12 deficiency anemia due to intrinsic factor deficiency: Secondary | ICD-10-CM

## 2020-01-24 DIAGNOSIS — R69 Illness, unspecified: Secondary | ICD-10-CM | POA: Diagnosis not present

## 2020-01-24 DIAGNOSIS — R351 Nocturia: Secondary | ICD-10-CM | POA: Diagnosis not present

## 2020-01-24 DIAGNOSIS — R972 Elevated prostate specific antigen [PSA]: Secondary | ICD-10-CM

## 2020-01-24 DIAGNOSIS — G4730 Sleep apnea, unspecified: Secondary | ICD-10-CM | POA: Diagnosis not present

## 2020-01-24 DIAGNOSIS — F32A Depression, unspecified: Secondary | ICD-10-CM

## 2020-01-24 LAB — BASIC METABOLIC PANEL
BUN: 9 mg/dL (ref 6–23)
CO2: 30 mEq/L (ref 19–32)
Calcium: 9.1 mg/dL (ref 8.4–10.5)
Chloride: 107 mEq/L (ref 96–112)
Creatinine, Ser: 1.01 mg/dL (ref 0.40–1.50)
GFR: 68.39 mL/min (ref >60.00)
Glucose, Bld: 85 mg/dL (ref 70–99)
Potassium: 4.3 mEq/L (ref 3.5–5.1)
Sodium: 141 mEq/L (ref 135–145)

## 2020-01-24 LAB — CBC
HCT: 47.9 % (ref 39.0–52.0)
Hemoglobin: 16.3 g/dL (ref 13.0–17.0)
MCHC: 34.1 g/dL (ref 30.0–36.0)
MCV: 82.9 fl (ref 78.0–100.0)
Platelets: 51 10*3/uL — ABNORMAL LOW (ref 150.0–400.0)
RBC: 5.78 Mil/uL (ref 4.22–5.81)
RDW: 14.7 % (ref 11.5–15.5)
WBC: 5.4 10*3/uL (ref 4.0–10.5)

## 2020-01-24 LAB — VITAMIN B12: Vitamin B-12: 625 pg/mL (ref 211–911)

## 2020-01-24 NOTE — Assessment & Plan Note (Signed)
Intermittent with seasonal changes, using Claritin PRN. Continue same.

## 2020-01-24 NOTE — Assessment & Plan Note (Signed)
Immunizations UTD, he will call with Covid-19 vaccine dates. PSA UTD, follows with Urology. Colonoscopy not needed given age. I verified that he was told this given FH of colon cancer.   Encouraged a healthy diet and exercise.  Exam today stable. Labs pending and also reviewed from August 2021.

## 2020-01-24 NOTE — Progress Notes (Signed)
Subjective:    Patient ID: [name], male    DOB: 1933/06/17, 87 y.o.   MRN: [id number]  HPI  This visit occurred during the SARS-CoV-2 public health emergency.  Safety protocols were in place, including screening questions prior to the visit, additional usage of staff PPE, and extensive cleaning of exam room while observing appropriate contact time as indicated for disinfecting solutions.   [name] is a 87 year old male who presents today for complete physical.  Immunizations: -Tetanus: Completed in 2013 -Influenza: Completed this season  -Shingles: Completed Zostavax -Pneumonia: Completed series -Covid-19: Completed series  Diet: He endorses a fair diet.  Exercise: He is active, no regular exercise.   Eye exam: No recent exam Dental exam: No recent visit  Colonoscopy: Completed in 2011, no need for repeat imaging due to age despite FH of colon cancer.  PSA: Elevated, follows with Urology. Hep C Screen: Negative  BP Readings from Last 3 Encounters:  01/24/20 138/70  09/07/19 114/62  07/18/19 122/76     Review of Systems  Constitutional: Negative for unexpected weight change.  HENT: Negative for rhinorrhea.   Respiratory: Negative for cough and shortness of breath.   Cardiovascular: Negative for chest pain.  Gastrointestinal: Negative for constipation and diarrhea.  Genitourinary: Positive for difficulty urinating.       BPH symptoms improving, follows with Urology   Musculoskeletal: Negative for arthralgias and myalgias.  Skin: Negative for rash.  Allergic/Immunologic: Positive for environmental allergies.  Neurological: Negative for dizziness, numbness and headaches.  Hematological: Negative for adenopathy.  Psychiatric/Behavioral: The patient is not nervous/anxious.      Past Medical History:  Diagnosis Date  . Arthralgia of multiple joints    limited mobility w/  independant adl's  . BPH with obstruction/lower urinary tract symptoms   . Chronic  idiopathic monocytosis    followed by dr Waymon Budge--  per lov note 06/ 2017 persistant unexplained  with normal bone barrow bx  . Coronary atherosclerosis of unspecified type of vessel, native or graft    cardiologist-  dr Stanford Breed -- per lov note 11-19-2014 ,  cardiac cath in 2000-- pLAD 60-70%,  small intermediate branch 70-80%,  mRCA 20%,  normal LV  . Degenerative arthritis of spine    cervical and lumar  . Diverticulosis of colon (without mention of hemorrhage)   . Dysuria    chronic  . Elevated PSA   . Feeling of incomplete bladder emptying   . Hiatal hernia    moderate per ct 11/ 2017  . History of adenomatous polyp of colon    2008  . History of atrial fibrillation    remote hx episode  . History of pancreatitis    07-01-2013  and 10-10-2015  . History of prostatitis    2014  . Hx of colonic polyps   . Hyperlipidemia   . Hypertension   . Mouth sore    roof of mouth sore   . Nephrolithiasis   . Nocturia more than twice per night    severe  w/ leakage  . OSA (obstructive sleep apnea)    INTOLERANT CPAP  . Osteoarthritis   . Pernicious anemia    B12  Def.  . Peyronie disease   . Renal insufficiency   . Sepsis ([place]) 03/22/2017  . Thrombocytopenia, unspecified ([place]) hemotology/oncologist-  dr Waymon Budge   per dr Velta Addison note 06/ 2016  secondary to vitro clumping     Social History   Socioeconomic History  .  Marital status: Widowed    Spouse name: Not on file  . Number of children: 2  . Years of education: Not on file  . Highest education level: Not on file  Occupational History  . Occupation: retired    Fish farm manager: RETIRED  Tobacco Use  . Smoking status: Former Smoker    Types: Pipe    Quit date: 01/18/1966    Years since quitting: 54.0  . Smokeless tobacco: Never Used  Vaping Use  . Vaping Use: Never used  Substance and Sexual Activity  . Alcohol use: No    Alcohol/week: 0.0 standard drinks  . Drug use: No  . Sexual activity: Not Currently   Other Topics Concern  . Not on file  Social History Narrative   DNR   Widowed   2 daughters leave near by   Social Determinants of Health   Financial Resource Strain: Not on file  Food Insecurity: Not on file  Transportation Needs: Not on file  Physical Activity: Not on file  Stress: Not on file  Social Connections: Not on file  Intimate Partner Violence: Not on file    Past Surgical History:  Procedure Laterality Date  . BONE MARROW BIOPSY  2011   normal  . CARDIAC CATHETERIZATION  2000   per dr Kathyrn Drown note --  dLAD 60-70%,  small intermediate branch 70-80%,  mRCA 20%,  normal LV  . CARDIOVASCULAR STRESS TEST  01-29-2013   dr Stanford Breed   normal nuclear study w/ no ischemia,  normal LV function and wall motion , ef 82%  . CATARACT EXTRACTION W/ INTRAOCULAR LENS  IMPLANT, BILATERAL  08/2015  . CHOLECYSTECTOMY  11/24/2010   Procedure: LAPAROSCOPIC CHOLECYSTECTOMY WITH INTRAOPERATIVE CHOLANGIOGRAM;  Surgeon: Earnstine Regal, MD;  Location: [facility];  Service: General;  Laterality: N/A;  c-arm  . CYSTOSCOPY W/ URETERAL STENT PLACEMENT Left 12/17/2015   Procedure: CYSTOSCOPY WITH RETROGRADE PYELOGRAM/URETERAL STENT PLACEMENT;  Surgeon: Ardis Hughs, MD;  Location: [facility];  Service: Urology;  Laterality: Left;  . CYSTOSCOPY WITH RETROGRADE PYELOGRAM, URETEROSCOPY AND STENT PLACEMENT Left 12/22/2015   Procedure: CYSTOSCOPY WITH LEFT RETROGRADE  URETEROSCOPY AND STENT PLACEMENT;  Surgeon: Carolan Clines, MD;  Location[facility];  Service: Urology;  Laterality: Left;  . ERCP N/A 07/04/2013   Procedure: ENDOSCOPIC RETROGRADE CHOLANGIOPANCREATOGRAPHY (ERCP);  Surgeon: Gatha Mayer, MD;  Location: [facility];  Service: Endoscopy;  Laterality: N/A;  MAC if available  . ERCP N/A 10/12/2015   Procedure: ENDOSCOPIC RETROGRADE CHOLANGIOPANCREATOGRAPHY (ERCP);  Surgeon: [name], MD;  Location: [facility];  Service: Endoscopy;  Laterality: N/A;  . HOLMIUM LASER  APPLICATION Left 81/04/4816   Procedure: HOLMIUM LASER APPLICATION;  Surgeon: Carolan Clines, MD;  Location: [facility];  Service: Urology;  Laterality: Left;  . INGUINAL HERNIA REPAIR Right 01/25/2002  . KNEE ARTHROSCOPY  x5  . SATURATION BIOPSY OF PROSTATE  05-29-2007  and 01-26-2008  . SHOULDER ARTHROSCOPY WITH OPEN ROTATOR CUFF REPAIR AND DISTAL CLAVICLE ACROMINECTOMY Right 11/19/2004  . TOTAL HIP ARTHROPLASTY Right 05/21/2009  . TOTAL KNEE ARTHROPLASTY Bilateral left 05-02-2003/  right  03-23-2010  . TRANSTHORACIC ECHOCARDIOGRAM  12/23/2004   ef 60%, mild MV calcification without stenosis/  mild TR,  PASP 40mHg  . UVULOPALATOPHARYNGOPLASTY  1993    w/  T & A    Family History  Problem Relation Age of Onset  . Colon cancer Father 426 . Peripheral vascular disease Mother     Allergies  Allergen  Reactions  . Penicillins Hives and Other (See Comments)    [place], passed out Tolerates cephalosporins  Has patient had a PCN reaction causing immediate rash, facial/tongue/throat swelling, SOB or lightheadedness with hypotension:  yes Has patient had a PCN reaction causing severe rash involving mucus membranes or skin necrosis: no Has patient had a PCN reaction that required hospitalization: no Has patient had a PCN reaction occurring within the last 10 years: no If all of the above answers are "NO", then may proceed with Cephalosporin use.     Current Outpatient Medications on File Prior to Visit  Medication Sig Dispense Refill  . acetaminophen (TYLENOL) 325 MG tablet Take 650 mg by mouth every 6 (six) hours as needed for mild pain.    . Artificial Tear Solution (TEARS NATURALE OP) Place 1-2 drops into both eyes at bedtime as needed (dry eyes).    . fluticasone (FLONASE) 50 MCG/ACT nasal spray Place 1 spray into both nostrils as needed for allergies or rhinitis.    . Loratadine (CLARITIN PO) Take 1 tablet by mouth daily.     . Multiple Vitamin (MULTIVITAMIN)  tablet Take 1 tablet by mouth daily.    . tamsulosin (FLOMAX) 0.4 MG CAPS capsule Take 0.4 mg by mouth in the morning and at bedtime.     No current facility-administered medications on file prior to visit.    BP 138/70 (BP Location: Left Arm, Patient Position: Sitting, Cuff Size: Normal)   Pulse 88   Temp 98.3 F (36.8 C) (Temporal)   Ht 5' (1.524 m)   Wt 144 lb 12.8 oz (65.7 kg)   SpO2 98%   BMI 28.28 kg/m    Objective:   Physical Exam Constitutional:      Appearance: He is well-nourished.  HENT:     Right Ear: Tympanic membrane and ear canal normal.     Left Ear: Tympanic membrane and ear canal normal.     Mouth/Throat:     Mouth: Oropharynx is clear and moist.  Eyes:     Extraocular Movements: EOM normal.     Pupils: Pupils are equal, round, and reactive to light.  Cardiovascular:     Rate and Rhythm: Normal rate and regular rhythm.  Pulmonary:     Effort: Pulmonary effort is normal.     Breath sounds: Normal breath sounds.  Abdominal:     General: Bowel sounds are normal.     Palpations: Abdomen is soft.     Tenderness: There is no abdominal tenderness.  Musculoskeletal:        General: Normal range of motion.     Cervical back: Neck supple.  Skin:    General: Skin is warm and dry.  Neurological:     Mental Status: He is alert and oriented to person, place, and time.     Cranial Nerves: No cranial nerve deficit.     Deep Tendon Reflexes:     Reflex Scores:      Patellar reflexes are 2+ on the right side and 2+ on the left side. Psychiatric:        Mood and Affect: Mood and affect and mood normal.            Assessment & Plan:

## 2020-01-24 NOTE — Assessment & Plan Note (Signed)
LDL stable from labs in August 2021, he declines statin therapy.

## 2020-01-24 NOTE — Assessment & Plan Note (Signed)
Symptoms improved some with Flomax 0.4 mg BID. Follows with Urology, continue current regimen.

## 2020-01-24 NOTE — Assessment & Plan Note (Signed)
Ongoing, no worse than usual. He does have a job M-F which has helped with fatigue and depression.  Continue to monitor.

## 2020-01-24 NOTE — Assessment & Plan Note (Signed)
Recent level of 6.73, follows with Urology. Does have LUTS and is doing better on Flomax BID.

## 2020-01-24 NOTE — Assessment & Plan Note (Addendum)
Chronic since the passing of his wife in 2015 of 54 years, stable, no worse than usual. He is working M-F now which has helped. Continue to monitor.   He denies concerns today.

## 2020-01-24 NOTE — Patient Instructions (Addendum)
Stop by the lab prior to leaving today. I will notify you of your results once received.   Continue to remain active everyday.  Be sure to eat plenty of vegetables and fruit, lean protein. Ensure you are consuming 64 ounces of water daily.  Please call me with your Covid-19 vaccine dates.  It was a pleasure to see you today!   Preventive Care 5 Years and Older, Male Preventive care refers to lifestyle choices and visits with your health care provider that can promote health and wellness. This includes:  A yearly physical exam. This is also called an annual well check.  Regular dental and eye exams.  Immunizations.  Screening for certain conditions.  Healthy lifestyle choices, such as diet and exercise. What can I expect for my preventive care visit? Physical exam Your health care provider will check:  Height and weight. These may be used to calculate body mass index (BMI), which is a measurement that tells if you are at a healthy weight.  Heart rate and blood pressure.  Your skin for abnormal spots. Counseling Your health care provider may ask you questions about:  Alcohol, tobacco, and drug use.  Emotional well-being.  Home and relationship well-being.  Sexual activity.  Eating habits.  History of falls.  Memory and ability to understand (cognition).  Work and work Statistician. What immunizations do I need?  Influenza (flu) vaccine  This is recommended every year. Tetanus, diphtheria, and pertussis (Tdap) vaccine  You may need a Td booster every 10 years. Varicella (chickenpox) vaccine  You may need this vaccine if you have not already been vaccinated. Zoster (shingles) vaccine  You may need this after age 9. Pneumococcal conjugate (PCV13) vaccine  One dose is recommended after age 11. Pneumococcal polysaccharide (PPSV23) vaccine  One dose is recommended after age 50. Measles, mumps, and rubella (MMR) vaccine  You may need at least one dose  of MMR if you were born in 1957 or later. You may also need a second dose. Meningococcal conjugate (MenACWY) vaccine  You may need this if you have certain conditions. Hepatitis A vaccine  You may need this if you have certain conditions or if you travel or work in places where you may be exposed to hepatitis A. Hepatitis B vaccine  You may need this if you have certain conditions or if you travel or work in places where you may be exposed to hepatitis B. Haemophilus influenzae type b (Hib) vaccine  You may need this if you have certain conditions. You may receive vaccines as individual doses or as more than one vaccine together in one shot (combination vaccines). Talk with your health care provider about the risks and benefits of combination vaccines. What tests do I need? Blood tests  Lipid and cholesterol levels. These may be checked every 5 years, or more frequently depending on your overall health.  Hepatitis C test.  Hepatitis B test. Screening  Lung cancer screening. You may have this screening every year starting at age 88 if you have a 30-pack-year history of smoking and currently smoke or have quit within the past 15 years.  Colorectal cancer screening. All adults should have this screening starting at age 26 and continuing until age 86. Your health care provider may recommend screening at age 41 if you are at increased risk. You will have tests every 1-10 years, depending on your results and the type of screening test.  Prostate cancer screening. Recommendations will vary depending on your family history and  other risks.  Diabetes screening. This is done by checking your blood sugar (glucose) after you have not eaten for a while (fasting). You may have this done every 1-3 years.  Abdominal aortic aneurysm (AAA) screening. You may need this if you are a current or former smoker.  Sexually transmitted disease (STD) testing. Follow these instructions at home: Eating and  drinking  Eat a diet that includes fresh fruits and vegetables, whole grains, lean protein, and low-fat dairy products. Limit your intake of foods with high amounts of sugar, saturated fats, and salt.  Take vitamin and mineral supplements as recommended by your health care provider.  Do not drink alcohol if your health care provider tells you not to drink.  If you drink alcohol: ? Limit how much you have to 0-2 drinks a day. ? Be aware of how much alcohol is in your drink. In the [place] one drink equals one 12 oz bottle of beer (355 mL), one 5 oz glass of wine (148 mL), or one 1 oz glass of hard liquor (44 mL). Lifestyle  Take daily care of your teeth and gums.  Stay active. Exercise for at least 30 minutes on 5 or more days each week.  Do not use any products that contain nicotine or tobacco, such as cigarettes, e-cigarettes, and chewing tobacco. If you need help quitting, ask your health care provider.  If you are sexually active, practice safe sex. Use a condom or other form of protection to prevent STIs (sexually transmitted infections).  Talk with your health care provider about taking a low-dose aspirin or statin. What's next?  Visit your health care provider once a year for a well check visit.  Ask your health care provider how often you should have your eyes and teeth checked.  Stay up to date on all vaccines. This information is not intended to replace advice given to you by your health care provider. Make sure you discuss any questions you have with your health care provider. Document Revised: 12/29/2017 Document Reviewed: 12/29/2017 Elsevier Patient Education  2020 [employer].

## 2020-01-24 NOTE — Assessment & Plan Note (Signed)
Chronic, repeat CBC pending. Has been evaluated by hematology in the past.

## 2020-01-24 NOTE — Assessment & Plan Note (Signed)
Cannot tolerate CPAP machine, has tried several devises.

## 2020-01-24 NOTE — Assessment & Plan Note (Addendum)
LDL at goal from labs in August 2021. Continue to monitor, not on meds.

## 2020-01-24 NOTE — Assessment & Plan Note (Signed)
Compliant to monthly B12 injections, repeat B12 lab pending. Continue monthly injections.

## 2020-01-31 DIAGNOSIS — Z20822 Contact with and (suspected) exposure to covid-19: Secondary | ICD-10-CM | POA: Diagnosis not present

## 2020-01-31 DIAGNOSIS — R0602 Shortness of breath: Secondary | ICD-10-CM | POA: Diagnosis not present

## 2020-01-31 DIAGNOSIS — U071 COVID-19: Secondary | ICD-10-CM | POA: Diagnosis not present

## 2020-02-05 ENCOUNTER — Encounter (HOSPITAL_COMMUNITY): Payer: Self-pay | Admitting: *Deleted

## 2020-02-05 ENCOUNTER — Emergency Department (HOSPITAL_COMMUNITY): Payer: Medicare HMO

## 2020-02-05 ENCOUNTER — Other Ambulatory Visit: Payer: Self-pay

## 2020-02-05 ENCOUNTER — Telehealth: Payer: Self-pay | Admitting: Primary Care

## 2020-02-05 ENCOUNTER — Telehealth: Payer: Self-pay | Admitting: Internal Medicine

## 2020-02-05 ENCOUNTER — Emergency Department (HOSPITAL_COMMUNITY)
Admission: EM | Admit: 2020-02-05 | Discharge: 2020-02-05 | Disposition: A | Discharge status: Home / Self Care | Payer: Medicare HMO | Attending: Emergency Medicine | Admitting: Emergency Medicine

## 2020-02-05 DIAGNOSIS — I10 Essential (primary) hypertension: Secondary | ICD-10-CM | POA: Diagnosis not present

## 2020-02-05 DIAGNOSIS — R5383 Other fatigue: Secondary | ICD-10-CM | POA: Diagnosis not present

## 2020-02-05 DIAGNOSIS — R0602 Shortness of breath: Secondary | ICD-10-CM | POA: Diagnosis not present

## 2020-02-05 DIAGNOSIS — I2510 Atherosclerotic heart disease of native coronary artery without angina pectoris: Secondary | ICD-10-CM | POA: Insufficient documentation

## 2020-02-05 DIAGNOSIS — Z96641 Presence of right artificial hip joint: Secondary | ICD-10-CM | POA: Insufficient documentation

## 2020-02-05 DIAGNOSIS — Z87891 Personal history of nicotine dependence: Secondary | ICD-10-CM | POA: Diagnosis not present

## 2020-02-05 DIAGNOSIS — I517 Cardiomegaly: Secondary | ICD-10-CM | POA: Diagnosis not present

## 2020-02-05 DIAGNOSIS — Z96653 Presence of artificial knee joint, bilateral: Secondary | ICD-10-CM | POA: Diagnosis not present

## 2020-02-05 DIAGNOSIS — U071 COVID-19: Secondary | ICD-10-CM | POA: Diagnosis not present

## 2020-02-05 DIAGNOSIS — R509 Fever, unspecified: Secondary | ICD-10-CM | POA: Diagnosis not present

## 2020-02-05 DIAGNOSIS — R0789 Other chest pain: Secondary | ICD-10-CM | POA: Diagnosis not present

## 2020-02-05 LAB — LACTIC ACID, PLASMA: Lactic Acid, Venous: 1.1 mmol/L (ref 0.5–1.9)

## 2020-02-05 LAB — COMPREHENSIVE METABOLIC PANEL
ALT: 19 U/L (ref 0–44)
AST: 19 U/L (ref 15–41)
Albumin: 3.5 g/dL (ref 3.5–5.0)
Alkaline Phosphatase: 66 U/L (ref 38–126)
Anion gap: 11 (ref 5–15)
BUN: 11 mg/dL (ref 8–23)
CO2: 22 mmol/L (ref 22–32)
Calcium: 8.4 mg/dL — ABNORMAL LOW (ref 8.9–10.3)
Chloride: 103 mmol/L (ref 98–111)
Creatinine, Ser: 0.94 mg/dL (ref 0.61–1.24)
GFR, Estimated: >60 mL/min (ref >60)
Glucose, Bld: 125 mg/dL — ABNORMAL HIGH (ref 70–99)
Potassium: 3.9 mmol/L (ref 3.5–5.1)
Sodium: 136 mmol/L (ref 135–145)
Total Bilirubin: 1.6 mg/dL — ABNORMAL HIGH (ref 0.3–1.2)
Total Protein: 5.5 g/dL — ABNORMAL LOW (ref 6.5–8.1)

## 2020-02-05 LAB — TROPONIN I (HIGH SENSITIVITY): Troponin I (High Sensitivity): 13 ng/L (ref <18)

## 2020-02-05 MED ORDER — IOHEXOL 350 MG/ML SOLN
75.0000 mL | Freq: Once | INTRAVENOUS | Status: AC | PRN
  Administered 2020-02-05: 75 mL via INTRAVENOUS

## 2020-02-05 MED ORDER — ACETAMINOPHEN 325 MG PO TABS
650.0000 mg | ORAL_TABLET | Freq: Once | ORAL | Status: AC | PRN
  Administered 2020-02-05: 650 mg via ORAL
  Filled 2020-02-05: qty 2

## 2020-02-05 NOTE — Telephone Encounter (Signed)
[facility] - Client TELEPHONE ADVICE RECORD AccessNurse Patient Name: [name] Gender: Male DOB: [birth year] Age: 87 Y 69 M 5 D Return Phone Number: [phone] (Primary), [phone] (Secondary) [place] [address] Client [facility] - Client Client Site [facility] - Day Physician Alma Friendly - NP Contact Type Call Who Is Calling Patient / Member / Family / Caregiver Call Type Triage / Clinical Caller Name Lemmie Evens Relationship To Patient Daughter Return Phone Number [phone] (Primary) Chief Complaint BREATHING - shortness of breath or sounds breathless Reason for Call Symptomatic / Request for [place] states that she is calling about her father who is COVID positive and he is experiencing shortness of breath, fatigue, weakness. She is concerned by these worsening symptoms. [facility] Translation No Nurse Assessment Nurse: Sumner Boast, RN, En[place] Derry Date/Time Eilene Ghazi Time): 02/05/2020 1:03:23 PM Confirm and document reason for call. If symptomatic, describe symptoms. ---Caller states that she is calling about her father who is COVID positive, diagnosed 5 days ago. He is having shortness of breath, fatigue, weakness. She is concerned by these worsening symptoms. He is on a Z-Pack and Dexamethosone, which he has finished. No fever. Does the patient have any new or worsening symptoms? ---Yes Will a triage be completed? ---Yes Related visit to physician within the last 2 weeks? ---Yes Does the PT have any chronic conditions? (i.e. diabetes, asthma, this includes High risk factors for pregnancy, etc.) ---No Is this a behavioral health or substance abuse call? ---No Guidelines Guideline Title Affirmed Question Affirmed Notes Nurse Date/Time ([place] Time) COVID-19 - Diagnosed or  Suspected MODERATE difficulty breathing (e.g., speaks in phrases, SOB even at rest, pulse 100-120) Sumner Boast, RN, Enid Derry 02/05/2020 1:08:12 PM Disp. Time Eilene Ghazi Time) Disposition Final User 02/05/2020 12:57:26 PM Send to Urgent [facility] PLEASE NOTE: All timestamps contained within this report are represented as [place] Standard Time. CONFIDENTIALTY NOTICE: This fax transmission is intended only for the addressee. It contains information that is legally privileged, confidential or otherwise protected from use or disclosure. If you are not the intended recipient, you are strictly prohibited from reviewing, disclosing, copying using or disseminating any of this information or taking any action in reliance on or regarding this information. If you have received this fax in error, please notify us immediately by telephone so that we can arrange for its return to [place]. Phone: [phone], Toll-Free: [phone], Fax: [phone] Page: 2 of 2 Call Id: [id number] 02/05/2020 1:12:41 PM Go to ED Now Yes Sumner Boast, RN, Teola Bradley Disagree/Comply Comply Caller Understands Yes PreDisposition Go to ED Care Advice Given Per Guideline GO TO ED NOW: CARE ADVICE given per COVID-19 - DIAGNOSED OR SUSPECTED (Adult) guideline. Referrals GO TO FACILITY UNDECIDED GO TO FACILITY OTHER - SPECIFY

## 2020-02-05 NOTE — Telephone Encounter (Signed)
See notes below access nurse note; sending to Gentry Fitz NP and [facility] CMA.

## 2020-02-05 NOTE — Telephone Encounter (Signed)
Called patient states that he is very weak and sore all over. Does not think he has a temperature. Right now cough is better not having issues with breathing. He does have sore throat. Advised to drink lots of water. He states that he normally he does not drink a lot and feels "flooded" right now. Has not had issues with increased urination.

## 2020-02-05 NOTE — Telephone Encounter (Signed)
Noted, labs in process

## 2020-02-05 NOTE — Telephone Encounter (Signed)
Error

## 2020-02-05 NOTE — Telephone Encounter (Signed)
Noted, agree with hydration. Okay to take Tylenol for body aches. Advance diet slowly as tolerated, have him call us with an update in a few days.

## 2020-02-05 NOTE — Telephone Encounter (Signed)
There is another phone note circulating, [name], you checked on him earlier today, right?

## 2020-02-05 NOTE — ED Triage Notes (Signed)
Pt had tested + for covid on last Wed and still has extreme weakness and fatigue. No resp distress is noted at this time.

## 2020-02-05 NOTE — Telephone Encounter (Signed)
Noted. Can we call to check on patient today?

## 2020-02-05 NOTE — Discharge Instructions (Signed)
As discussed, all of your labs were reassuring today. Your platelet count was lower than normal. Have your platelet count recheck within the next week. Your CT chest showed pneumonia from COVID, but no blood clot. Follow-up with your PCP within the next week for further evaluation. Return to the ER for new or worsening symptoms.

## 2020-02-05 NOTE — Telephone Encounter (Signed)
[place] Night - Client TELEPHONE ADVICE RECORD AccessNurse Patient Name: [name] 62 Gender: Male DOB: 05-26-1933 Age: 87 Y 32 M 4 D Return Phone Number: [phone] (Primary) [place] [address] Client [facility] Night - Client Client Site [place] Physician Alma Friendly - NP Contact Type Call Who Is Calling Patient / Member / Family / Caregiver Call Type Triage / Clinical Caller Name [name] Relationship To Patient Daughter Return Phone Number [phone] (Primary) Chief Complaint CONFUSION - new onset Reason for Call Symptomatic / Request for [facility] states her father tested pos for covid last Wed. He is coughing up a lot of yellow mucous and he is very weak. She thinks he may be dehydrated as well because he seems disoriented as well. Translation No Nurse Assessment Nurse: Terence Lux, RN, Ashok Norris Date/Time (Eastern Time): 02/04/2020 4:46:04 PM Confirm and document reason for call. If symptomatic, describe symptoms. ---Caller states her father tested + for covid last Wed. He is coughing up a lot of yellow mucous and he is very weak. She thinks he may be dehydrated as well because he seems disoriented as well. Seen at [facility], given an inhaler. No fever Does the patient have any new or worsening symptoms? ---Yes Will a triage be completed? ---Yes Related visit to physician within the last 2 weeks? ---Yes Does the PT have any chronic conditions? (i.e. diabetes, asthma, this includes High risk factors for pregnancy, etc.) ---No Is this a behavioral health or substance abuse call? ---No Guidelines Guideline Title Affirmed Question Affirmed Notes Nurse Date/Time (Eastern Time) COVID-19 - Diagnosed or Suspected HIGH RISK for severe COVID complications (e.g., age > 2 years, obesity with BMI > 25, pregnant, chronic lung disease or  other chronic medical condition) (Exception: Already seen by PCP and no new or worsening symptoms.) Terence Lux, RN, Ashok Norris 02/04/2020 4:48:50 PM PLEASE NOTE: All timestamps contained within this report are represented as [place] Standard Time. CONFIDENTIALTY NOTICE: This fax transmission is intended only for the addressee. It contains information that is legally privileged, confidential or otherwise protected from use or disclosure. If you are not the intended recipient, you are strictly prohibited from reviewing, disclosing, copying using or disseminating any of this information or taking any action in reliance on or regarding this information. If you have received this fax in error, please notify us immediately by telephone so that we can arrange for its return to [place]. Phone: [phone], Toll-Free: [phone], Fax: [phone] Page: 2 of 2 Call Id: [id number] [place]. Time Eilene Ghazi Time) Disposition Final User 02/04/2020 4:43:13 PM Send to Urgent Vivia Budge 02/04/2020 5:02:08 PM Called On-Call Provider Terence Lux, RN, Ashok Norris 02/04/2020 4:54:57 PM Call PCP Now Yes Terence Lux, RN, Damien Fusi Disagree/Comply Comply Caller Understands Yes PreDisposition Call Doctor Care Advice Given Per Guideline CALL PCP NOW: * I'll page the on-call provider now. If you haven't heard from the provider (or me) within 30 minutes, call again. CALL BACK IF: * You become worse * Feeling dehydrated: Drink extra liquids. If the air in your home is dry, use a humidifier. Paging DoctorName Phone DateTime Result/Outcome Message Type Notes Einar Pheasant - MD [phone] 02/04/2020 5:02:08 PM Called On Call Provider - Reached Doctor Paged Einar Pheasant - MD 02/04/2020 5:03:03 PM Spoke with On Call - General Message Result Spoke to Dr Nicki Reaper. Symptoms and information reviewed. MD to call patients daughter to discuss.

## 2020-02-05 NOTE — Telephone Encounter (Signed)
Per chart review tab pt is at [facility] ED; sending note to Gentry Fitz NP and [facility] CMA.

## 2020-02-05 NOTE — ED Notes (Signed)
Critical lab Platelets 29 reported to dr Roslynn Amble

## 2020-02-05 NOTE — Telephone Encounter (Signed)
Access nurse called - Mr Sedivy daughter called (02/04/20 - [id number]).  Mr Grassel diagnosed with covid.  Diagnosed Wednesday (12/22) - at urgent care.  Was prescribed inhaler and oral steroid.  Daughter reports persistent cough - productive of yellow phlegm.  Thick mucus.  States getting choked on mucus.  Increased fatigue.  Daughter concern regarding possible pneumonia.  Discussed need for further evaluation given persistent/worsening symptoms despite steroids and inhalers.  Respiratory clinic closed. Discussed urgent care/ER evaluation - tonight.  She does not feel he needs to be seen tonight.  Is concerned about the roads and declines need for evaluation tonight.  Is agreeable for evaluation tomorrow.  Does not feel her father needs anything more at this time. Will call if problems.  Was questioning appt at [facility] office.  Discussed in office protocol with covid.  Discussed [facility]/ER/urgent care evaluation.

## 2020-02-05 NOTE — ED Provider Notes (Signed)
[facility] EMERGENCY DEPARTMENT Provider Note   CSN: [id number] Arrival date & time: 02/05/20  1451     History No chief complaint on file.   [name] is a 87 y.o. male with a past medical history significant for BPH, chronic idiopathic monocytosis, CAD, hyperlipidemia, hypertension, OSA, history of thrombocytopenia to the ED due to generalized weakness and fatigue that is readily worsened over the past few days.  Patient tested positive for COVID on 1/12.  He admits to being symptomatic for the past 2 weeks.  He has been taking vitamins with no relief.  Patient has received both his COVID vaccines however, not his booster shot.  He admits to chest pain 2 days ago that was worse with deep inspiration.  Patient reported to urgent care then and was advised to report to the ED.  Patient notes the chest pain resolved, so he did not report to the ED then.  No further episodes of chest pain.  Patient admits to intermittent shortness of breath.  He also admits to a dry cough.  Denies history of blood clots, recent surgeries, recent long immobilizations, and hormonal treatments.  Denies abdominal pain, nausea, vomiting, diarrhea  History obtained from patient and past medical records. No interpreter used during encounter.      Past Medical History:  Diagnosis Date  . Arthralgia of multiple joints    limited mobility w/  independant adl's  . BPH with obstruction/lower urinary tract symptoms   . Chronic idiopathic monocytosis    followed by dr Waymon Budge--  per lov note 06/ 2017 persistant unexplained  with normal bone barrow bx  . Coronary atherosclerosis of unspecified type of vessel, native or graft    cardiologist-  dr Stanford Breed -- per lov note 11-19-2014 ,  cardiac cath in 2000-- pLAD 60-70%,  small intermediate branch 70-80%,  mRCA 20%,  normal LV  . Degenerative arthritis of spine    cervical and lumar  . Diverticulosis of colon (without mention of hemorrhage)    . Dysuria    chronic  . Elevated PSA   . Feeling of incomplete bladder emptying   . Hiatal hernia    moderate per ct 11/ 2017  . History of adenomatous polyp of colon    2008  . History of atrial fibrillation    remote hx episode  . History of pancreatitis    07-01-2013  and 10-10-2015  . History of prostatitis    2014  . Hx of colonic polyps   . Hyperlipidemia   . Hypertension   . Mouth sore    roof of mouth sore   . Nephrolithiasis   . Nocturia more than twice per night    severe  w/ leakage  . OSA (obstructive sleep apnea)    INTOLERANT CPAP  . Osteoarthritis   . Pernicious anemia    B12  Def.  . Peyronie disease   . Renal insufficiency   . Sepsis ([place]) 03/22/2017  . Thrombocytopenia, unspecified ([place]) hemotology/oncologist-  dr Waymon Budge   per dr Velta Addison note 06/ 2016  secondary to vitro clumping    Patient Active Problem List   Diagnosis Date Noted  . Chronic fatigue 09/07/2019  . Hematuria 07/18/2019  . Dizziness 11/09/2018  . Change in vision 09/07/2017  . Allergic rhinitis 07/07/2017  . Preventative health care 07/06/2016  . Dysphagia 07/06/2016  . BPH (benign prostatic hyperplasia) 12/22/2015  . Depression 03/12/2015  . Skin rash 04/15/2014  . Choledocholithiasis with obstruction  07/02/2013  . Thrombocytopenia ([place]) 09/01/2012  . DNR (do not resuscitate) 06/06/2012  . ANEMIA, PERNICIOUS 01/03/2009  . Coronary atherosclerosis 12/12/2007  . HLD (hyperlipidemia) 06/14/2006  . Essential hypertension 06/14/2006  . DIVERTICULOSIS, COLON 06/14/2006  . Elevated PSA, less than 10 ng/ml 06/14/2006  . DEGENERATIVE DISC DISEASE 06/14/2006  . Sleep apnea 06/14/2006    Past Surgical History:  Procedure Laterality Date  . BONE MARROW BIOPSY  2011   normal  . CARDIAC CATHETERIZATION  2000   per dr Kathyrn Drown note --  dLAD 60-70%,  small intermediate branch 70-80%,  mRCA 20%,  normal LV  . CARDIOVASCULAR STRESS TEST  01-29-2013   dr Stanford Breed    normal nuclear study w/ no ischemia,  normal LV function and wall motion , ef 82%  . CATARACT EXTRACTION W/ INTRAOCULAR LENS  IMPLANT, BILATERAL  08/2015  . CHOLECYSTECTOMY  11/24/2010   Procedure: LAPAROSCOPIC CHOLECYSTECTOMY WITH INTRAOPERATIVE CHOLANGIOGRAM;  Surgeon: Earnstine Regal, MD;  Location: [facility];  Service: General;  Laterality: N/A;  c-arm  . CYSTOSCOPY W/ URETERAL STENT PLACEMENT Left 12/17/2015   Procedure: CYSTOSCOPY WITH RETROGRADE PYELOGRAM/URETERAL STENT PLACEMENT;  Surgeon: Ardis Hughs, MD;  Location: [facility];  Service: Urology;  Laterality: Left;  . CYSTOSCOPY WITH RETROGRADE PYELOGRAM, URETEROSCOPY AND STENT PLACEMENT Left 12/22/2015   Procedure: CYSTOSCOPY WITH LEFT RETROGRADE  URETEROSCOPY AND STENT PLACEMENT;  Surgeon: Carolan Clines, MD;  Location: [facility];  Service: Urology;  Laterality: Left;  . ERCP N/A 07/04/2013   Procedure: ENDOSCOPIC RETROGRADE CHOLANGIOPANCREATOGRAPHY (ERCP);  Surgeon: Gatha Mayer, MD;  Location: [facility];  Service: Endoscopy;  Laterality: N/A;  MAC if available  . ERCP N/A 10/12/2015   Procedure: ENDOSCOPIC RETROGRADE CHOLANGIOPANCREATOGRAPHY (ERCP);  Surgeon: [name], MD;  Location: [facility];  Service: Endoscopy;  Laterality: N/A;  . HOLMIUM LASER APPLICATION Left 35/04/5623   Procedure: HOLMIUM LASER APPLICATION;  Surgeon: Carolan Clines, MD;  Location: [facility];  Service: Urology;  Laterality: Left;  . INGUINAL HERNIA REPAIR Right 01/25/2002  . KNEE ARTHROSCOPY  x5  . SATURATION BIOPSY OF PROSTATE  05-29-2007  and 01-26-2008  . SHOULDER ARTHROSCOPY WITH OPEN ROTATOR CUFF REPAIR AND DISTAL CLAVICLE ACROMINECTOMY Right 11/19/2004  . TOTAL HIP ARTHROPLASTY Right 05/21/2009  . TOTAL KNEE ARTHROPLASTY Bilateral left 05-02-2003/  right  03-23-2010  . TRANSTHORACIC ECHOCARDIOGRAM  12/23/2004   ef 60%, mild MV calcification without stenosis/  mild TR,  PASP 71mHg  .  UVULOPALATOPHARYNGOPLASTY  1993    w/  T & A       Family History  Problem Relation Age of Onset  . Colon cancer Father 457 . Peripheral vascular disease Mother     Social History   Tobacco Use  . Smoking status: Former Smoker    Types: Pipe    Quit date: 01/18/1966    Years since quitting: 54.0  . Smokeless tobacco: Never Used  Vaping Use  . Vaping Use: Never used  Substance Use Topics  . Alcohol use: No    Alcohol/week: 0.0 standard drinks  . Drug use: No    Home Medications Prior to Admission medications   Medication Sig Start Date End Date Taking? Authorizing Provider  acetaminophen (TYLENOL) 325 MG tablet Take 650 mg by mouth every 6 (six) hours as needed for mild pain.    [provider]  Artificial Tear Solution (TEARS NATURALE OP) Place 1-2 drops into both eyes at bedtime as needed (dry eyes).  [provider]  fluticasone (FLONASE) 50 MCG/ACT nasal spray Place 1 spray into both nostrils as needed for allergies or rhinitis.    [provider]  Loratadine (CLARITIN PO) Take 1 tablet by mouth daily.     [provider]  Multiple Vitamin (MULTIVITAMIN) tablet Take 1 tablet by mouth daily.    [provider]  tamsulosin (FLOMAX) 0.4 MG CAPS capsule Take 0.4 mg by mouth in the morning and at bedtime.    [provider]    Allergies    Penicillins  Review of Systems   Review of Systems  Constitutional: Positive for chills, fatigue and fever.  Respiratory: Positive for cough and shortness of breath.   Cardiovascular: Positive for chest pain.  Gastrointestinal: Negative for abdominal pain, diarrhea, nausea and vomiting.  Neurological: Positive for weakness (generalized).  All other systems reviewed and are negative.   Physical Exam Updated Vital Signs BP (!) 138/49   Pulse (!) 103   Temp 97.7 F (36.5 C) (Oral)   Resp 18   Ht 5' (1.524 m)   Wt 65.8 kg   SpO2 97%   BMI 28.32 kg/m   Physical  Exam Vitals and nursing note reviewed.  Constitutional:      General: He is not in acute distress.    Appearance: He is not toxic-appearing.  HENT:     Head: Normocephalic.  Eyes:     Pupils: Pupils are equal, round, and reactive to light.  Cardiovascular:     Rate and Rhythm: Normal rate and regular rhythm.     Pulses: Normal pulses.     Heart sounds: Normal heart sounds. No murmur heard. No friction rub. No gallop.   Pulmonary:     Effort: Pulmonary effort is normal.     Breath sounds: Normal breath sounds.     Comments: Respirations equal and unlabored, patient able to speak in full sentences, lungs clear to auscultation bilaterally Abdominal:     General: Abdomen is flat. Bowel sounds are normal. There is no distension.     Palpations: Abdomen is soft.     Tenderness: There is no abdominal tenderness. There is no guarding or rebound.  Musculoskeletal:     Cervical back: Neck supple.     Comments: No lower extremity edema. Negative homan sign bilaterally.  Skin:    General: Skin is warm and dry.  Neurological:     General: No focal deficit present.     Mental Status: He is alert.  Psychiatric:        Mood and Affect: Mood normal.        Behavior: Behavior normal.     ED Results / Procedures / Treatments   Labs (all labs ordered are listed, but only abnormal results are displayed) Labs Reviewed  COMPREHENSIVE METABOLIC PANEL - Abnormal; Notable for the following components:      Result Value   Glucose, Bld 125 (*)    Calcium 8.4 (*)    Total Protein 5.5 (*)    Total Bilirubin 1.6 (*)    All other components within normal limits  CBC WITH DIFFERENTIAL/PLATELET - Abnormal; Notable for the following components:   WBC 11.8 (*)    RBC 6.14 (*)    Platelets 29 (*)    Lymphs Abs 0.5 (*)    Monocytes Absolute 4.2 (*)    Abs Immature Granulocytes 0.32 (*)    All other components within normal limits  LACTIC ACID, PLASMA  TROPONIN I (HIGH SENSITIVITY)  EKG EKG  Interpretation  Date/Time:  Tuesday February 05 2020 21:40:26 EST Ventricular Rate:  89 PR Interval:    QRS Duration: 119 QT Interval:  374 QTC Calculation: 456 R Axis:   -79 Text Interpretation: Sinus arrhythmia Incomplete RBBB and LAFB Consider anterior infarct Confirmed by Quintella Reichert [phone]) on 02/05/2020 9:54:15 PM   Radiology DG Chest 2 View  Result Date: 02/05/2020 CLINICAL DATA:  COVID positive, fever. EXAM: CHEST - 2 VIEW COMPARISON:  chest x-ray 03/21/2017 FINDINGS: The heart size and mediastinal contours are unchanged. Aortic arch calcifications. No focal consolidation. No pulmonary edema. No pleural effusion. No pneumothorax. No acute osseous abnormality. Degenerative changes of the shoulders. Right upper quadrant surgical clips. IMPRESSION: No active cardiopulmonary disease. Electronically Signed   By: Iven Finn M.D.   On: 02/05/2020 17:57   CT Angio Chest PE W and/or Wo Contrast  Result Date: 02/05/2020 CLINICAL DATA:  COVID positive, fatigue EXAM: CT ANGIOGRAPHY CHEST WITH CONTRAST TECHNIQUE: Multidetector CT imaging of the chest was performed using the standard protocol during bolus administration of intravenous contrast. Multiplanar CT image reconstructions and MIPs were obtained to evaluate the vascular anatomy. CONTRAST:  68m OMNIPAQUE IOHEXOL 350 MG/ML SOLN COMPARISON:  None. FINDINGS: Cardiovascular: There is a optimal opacification of the pulmonary arteries. There is no central,segmental, or subsegmental filling defects within the pulmonary arteries. There is mild cardiomegaly. Coronary artery calcifications are seen. No pericardial effusion or thickening. No evidence right heart strain. There is normal three-vessel brachiocephalic anatomy without proximal stenosis. Scattered aortic atherosclerosis. Mediastinum/Nodes: No hilar, mediastinal, or axillary adenopathy. Thyroid gland, trachea, and esophagus demonstrate no significant findings. Lungs/Pleura: Bilateral  peripheral patchy airspace opacities seen throughout both lungs, predominantly within both lower lobes and the right middle lobe. No pleural effusion or pneumothorax. Upper Abdomen: No acute abnormalities present in the visualized portions of the upper abdomen. Musculoskeletal: No chest wall abnormality. No acute or significant osseous findings. Degenerative changes are seen throughout the visualized thoracic spine. For Review of the MIP images confirms the above findings. IMPRESSION: No central, segmental, or subsegmental pulmonary embolism. Multifocal airspace opacities, predominantly within both lower lobes, consistent with atypical viral pneumonia. Aortic Atherosclerosis (ICD10-I70.0). Electronically Signed   By: BPrudencio PairM.D.   On: 02/05/2020 23:31    Procedures Procedures (including critical care time)  Medications Ordered in ED Medications  acetaminophen (TYLENOL) tablet 650 mg (650 mg Oral Given 02/05/20 1704)  iohexol (OMNIPAQUE) 350 MG/ML injection 75 mL (75 mLs Intravenous Contrast Given 02/05/20 2314)    ED Course  I have reviewed the triage vital signs and the nursing notes.  Pertinent labs & imaging results that were available during my care of the patient were reviewed by me and considered in my medical decision making (see chart for details).  Clinical Course as of 02/05/20 2353  Tue Feb 05, 2020  2126 Platelets(!!): 29 [CA]  2126 WBC(!): 11.8 [CA]  2126 Temp(!): 100.7 F (38.2 C) [CA]  2126 Pulse Rate(!): 118 [CA]  2327 Discussed EKG with cardiology who notes it appears to be multifocal atrial tachycardia. No treatment needed at this time.  [CA]    Clinical Course User Index [CA] ASuzy Bouchard PA-C   MDM Rules/Calculators/A&P                         87year old male presents to the ED due to generalized weakness and fatigue in the setting of COVID-19. Patient tested positive on 1/12 and  has progressively worsened. Admits to intermittent chest pain worse with  deep inspiration 2 days ago, but none since. Denies current chest pain. Upon arrival, patient febrile at 100.7 F and tachycardia 118, but otherwise reassuring vitals.  Patient in no acute distress and nontoxic-appearing.  Physical exam reassuring.  Lungs clear to auscultation bilaterally.  Abdomen soft, nondistended, nontender.  No lower extremity edema.  Negative Homans' sign bilaterally.  Routine labs ordered at triage.  Troponin added to rule out ACS giving intermittent chest pain 2 days ago. Discussed case with Dr. Ralene Bathe who evaluated patient at bedside and agrees with assessment and plan.   CBC significant for mild leukocytosis at 11.8.  Thrombocytopenia 29, patient has chronic thrombocytopenia.  CMP significant for hyperglycemia at 125 with no anion gap.  Doubt DKA.  Lactic acid normal at 1.1.  Chest x-ray personally reviewed which is negative for signs of pneumonia, pneumothorax, or widened mediastinum. CTA chest personally reviewed which demonstrates: IMPRESSION:  No central, segmental, or subsegmental pulmonary embolism.    Multifocal airspace opacities, predominantly within both lower  lobes, consistent with atypical viral pneumonia.    Aortic Atherosclerosis (ICD10-I70.0).   EKG reviewed which cardiologist. See note above. Troponin normal. Given chest pain was 2 days ago, delta troponin not warranted. Low suspicion for ACS. Patient ambulated in room and maintained O2 saturation above 95% with no difficulties. Suspect symptoms related to COVID infection.  Instructed patient to follow-up with PCP within the next week for further evaluation and to recheck platelet count. Strict ED precautions discussed with patient. Patient states understanding and agrees to plan. Patient discharged home in no acute distress and stable vitals  [name] was evaluated in Emergency Department on 02/05/2020 for the symptoms described in the history of present illness. He was evaluated in the context of the  global COVID-19 pandemic, which necessitated consideration that the patient might be at risk for infection with the SARS-CoV-2 virus that causes COVID-19. Institutional protocols and algorithms that pertain to the evaluation of patients at risk for COVID-19 are in a state of rapid change based on information released by regulatory bodies including the CDC and federal and state organizations. These policies and algorithms were followed during the patient's care in the ED.  Final Clinical Impression(s) / ED Diagnoses Final diagnoses:  COVID-19 virus infection    Rx / DC Orders ED Discharge Orders    None       Karie Kirks 02/05/20 2354    Quintella Reichert, MD 02/08/20 [phone]

## 2020-02-06 ENCOUNTER — Telehealth: Payer: Self-pay | Admitting: Primary Care

## 2020-02-06 LAB — CBC WITH DIFFERENTIAL/PLATELET
Abs Immature Granulocytes: 0.32 10*3/uL — ABNORMAL HIGH (ref 0.00–0.07)
Basophils Absolute: 0 10*3/uL (ref 0.0–0.1)
Basophils Relative: 0 %
Eosinophils Absolute: 0.2 10*3/uL (ref 0.0–0.5)
Eosinophils Relative: 1 %
HCT: 51.5 % (ref 39.0–52.0)
Hemoglobin: 16.9 g/dL (ref 13.0–17.0)
Immature Granulocytes: 3 %
Lymphocytes Relative: 4 %
Lymphs Abs: 0.5 10*3/uL — ABNORMAL LOW (ref 0.7–4.0)
MCH: 27.5 pg (ref 26.0–34.0)
MCHC: 32.8 g/dL (ref 30.0–36.0)
MCV: 83.9 fL (ref 80.0–100.0)
Monocytes Absolute: 4.2 10*3/uL — ABNORMAL HIGH (ref 0.1–1.0)
Monocytes Relative: 36 %
Neutro Abs: 6.6 10*3/uL (ref 1.7–7.7)
Neutrophils Relative %: 56 %
Platelets: 29 10*3/uL — CL (ref 150–400)
RBC: 6.14 MIL/uL — ABNORMAL HIGH (ref 4.22–5.81)
RDW: 14.2 % (ref 11.5–15.5)
WBC: 11.8 10*3/uL — ABNORMAL HIGH (ref 4.0–10.5)
nRBC: 0 % (ref 0.0–0.2)

## 2020-02-06 NOTE — Telephone Encounter (Signed)
Please thank his daughter for the update, I reviewed his emergency department visit in detail.  He had a CT scan of his chest which showed viral pneumonia. Antibiotics treat bacteria, not viruses, so this means that antibiotics will not work.  I am happy to send something to help him with his cough, has he ever had the Tessalon Perles?  These are cough pills that will not cause drowsiness.  She needs to monitor him for symptoms of increased shortness of breath.  If he is having trouble breathing then the need to return back to the hospital.

## 2020-02-06 NOTE — Telephone Encounter (Signed)
I was not aware that he has been lethargic and has been n.p.o. over the last 24 hours. For these reasons, coupled with his COVID-19 infection, he needs to go to the emergency department now for IV hydration.   I will send this to one of our nurses to discuss and gain better insight into the problem.  I also do not see where he was on prednisone, where did he get this from?

## 2020-02-06 NOTE — Telephone Encounter (Signed)
Patients daughter states that his cough is horrible and he is "choking to death".They are wondering if he could get something to calm his cough until his appt tom. EM

## 2020-02-06 NOTE — Telephone Encounter (Addendum)
Spoke with patient's daughter [name].  Patient is not lethargic but he is weak and not taking care of himself in the home, mostly sitting in his chair without eating or drinking for nearly 24 hours.  Both daughters are immunocompromised and facing surgeries in upcoming days and must quarantine from him as well.  There is no one in the home to help take care of him.   Daughters feel that their father is just giving up and wants to sit in the house and die.  He is very clear cognitively and will refuse to go to the ER as instructed by myself and Allie Bossier, NP given his need for hydration and further work up and treatment for progressing acute illness.  Daughter is aware that there is nothing more that we can do from an outpatient perspective and due to his ongoing decline with weakness, progressing symptoms of cough and inability to care for self in home, he needs to go back now to the hospital.  I did explain benefits of EMS transport that they can listen to his lungs, immediately start an IV for rehydrations and take him directly in to the ER for further workup.  Daughter was very understanding but states she highly doubts he will go with EMS.  She is going to take a plate of food over now and fluids to leave at his front door and will be staying in contact with him closely through the night.  If his symptoms progress she will call 911 for transport back to ER.  Otherwise, she plans to accompany him on his virtual visit tomorrow with Anda Kraft.   PCP made aware that we have clearly stated the need for ER now via EMS and that family is deferring transport or contact at the present but will monitor him carefully and follow up with our office tomorrow.

## 2020-02-06 NOTE — Telephone Encounter (Signed)
[facility] - Client TELEPHONE ADVICE RECORD [facility] Patient Name: [name] Gender: Male DOB: 02-18-1933 Age: 87 Y 66 M 6 D Return Phone Number: [phone] (Primary) [place] [address] Client [facility] - Client Client Site [place] - Day Physician Alma Friendly - NP Contact Type Call Who Is Calling Patient / Member / Family / Caregiver Call Type Triage / Clinical Caller Name Arnell Sieving Relationship To Patient Daughter Return Phone Number [phone] (Primary) Chief Complaint BREATHING - shortness of breath or sounds breathless Reason for Call Symptomatic / Request for [place] states she has a patient stating that her father is having symptoms of pneumonia caused by covid. He is having a strong cough and shortness of breath. He was taken to the ER last night. They did a chest xray and he was released with instructions to take flonase and claritin. He needs something to calm down his coughing. Translation No Nurse Assessment Nurse: Zorita Pang, RN, Deborah Date/Time (Eastern Time): 02/06/2020 2:10:17 PM Confirm and document reason for call. If symptomatic, describe symptoms. ---The caller states that her father was supposed to be seen at the office. She states that she had spoken with Dr. Nicki Reaper and they had a plan. She states that her twin sister took him to the ED and they sent him home on Flonase and Claritin. She states that her sister did not know that and took him to the ED. She is asking about getting him something for the cough. He can't eat due to coughing. Does the patient have any new or worsening symptoms? ---Yes Will a triage be completed? ---Yes Related visit to physician within the last 2 weeks? ---Yes Does the PT have any chronic conditions? (i.e. diabetes, asthma, this includes High risk factors for  pregnancy, etc.) ---Yes Is this a behavioral health or substance abuse call? ---No Guidelines Guideline Title Affirmed Question Affirmed Notes Nurse Date/Time ([place] Time) COVID-19 - Diagnosed or Suspected [1] Continuous (nonstop) coughing interferes with work or school AND [2] no improvement using Womble, RN, Neoma Laming 02/06/2020 2:15:41 PM PLEASE NOTE: All timestamps contained within this report are represented as [place] Standard Time. CONFIDENTIALTY NOTICE: This fax transmission is intended only for the addressee. It contains information that is legally privileged, confidential or otherwise protected from use or disclosure. If you are not the intended recipient, you are strictly prohibited from reviewing, disclosing, copying using or disseminating any of this information or taking any action in reliance on or regarding this information. If you have received this fax in error, please notify us immediately by telephone so that we can arrange for its return to [place]. Phone: [phone], Toll-Free: [phone], Fax: [phone] Page: 2 of 2 Call Id: [id number] Guidelines Guideline Title Affirmed Question Affirmed Notes Nurse Date/Time Eilene Ghazi Time) cough treatment per Care Advice Disp. Time Eilene Ghazi Time) Disposition Final User 02/06/2020 2:08:20 PM Send to Urgent Gwenlyn Found, [place] 02/06/2020 2:26:20 PM Call PCP within 24 Hours Yes Womble, RN, Garrel Ridgel Disagree/Comply Comply Caller Understands Yes PreDisposition Call Doctor Care Advice Given Per Guideline CALL PCP WITHIN 24 HOURS: CALL BACK IF: * You become worse Comments User: Marquis Buggy, RN Date/Time Eilene Ghazi Time): 02/06/2020 2:21:32 PM Daughter states that the ER doctor told her father that he does have pneumonia but not a blood clot. User: Marquis Buggy, RN Date/Time Eilene Ghazi Time): 02/06/2020 2:29:49 PM There was no answer on the backline and this nurse called the  main number and the message states that there are 8  callers in line. This nurse explained to the caller what she had done but that she would have to stay on hold to speak with someone at the office about her request and she verbalized understanding.

## 2020-02-06 NOTE — Telephone Encounter (Signed)
[name], pt's daughter, called to report pt is unable to rest due to productive cough w/yellow phlegm and SOB due to the cough. She reports pt is coughing up "small apple size yellow phlegm". She is concerned that the pt was d/c from the ER and was told he has pneumonia and he is to use Flonase and was not prescribed anything else. Pt also has sore throat. Pt was diagnosed with covid on 1/12 and was prescribed 5 day course of prednisone and abx but pt is finished with those now. Pt denies any other symptoms.  [name] is requesting another abx and prednisone. She reports the pt still cannot complete a sentence w/o coughing, which was the same as before he went to the ER and they released him home. She is concerned about the bad weather coming in a day or two and the pt being unable to go to the ER if needed and not having anything to help his cough. Advised a msg would be sent to PCP and this office would f/u but if symptoms worsened to call 911 or take pt to ER. [name] verbalized understanding.

## 2020-02-06 NOTE — Telephone Encounter (Signed)
Mail box is full not able to leave v/m

## 2020-02-06 NOTE — Telephone Encounter (Signed)
Spoke to daughter would like tessalon called in.   Also wanted to know if prednisone would be option. He had just finished a round recently. Patient has been NPO in 24 and was lethargic over the phone. I have reviewed with kate and recommended that they take him back to ED. Patient daughter will evaluate and take if she feels he needs to go. Stressed the importance with dehydration to have him evaluated. She will see about taking him does not feel it will do much good after visit last night. She does have phone visit with [place] 02/07/2020

## 2020-02-07 ENCOUNTER — Telehealth: Payer: Self-pay

## 2020-02-07 ENCOUNTER — Telehealth (INDEPENDENT_AMBULATORY_CARE_PROVIDER_SITE_OTHER): Payer: Medicare HMO | Admitting: Primary Care

## 2020-02-07 VITALS — BP 85/63 | HR 85 | Temp 98.6°F | Ht 60.0 in | Wt 145.0 lb

## 2020-02-07 DIAGNOSIS — U071 COVID-19: Secondary | ICD-10-CM

## 2020-02-07 MED ORDER — BENZONATATE 200 MG PO CAPS: 200.0000 mg | ORAL_CAPSULE | Freq: Three times a day (TID) | ORAL | 0 refills | Status: DC | PRN

## 2020-02-07 NOTE — Patient Instructions (Signed)
You must drink water for hydration. Try to eat small meals throughout the day when able.  We will call you tonight and tomorrow morning to check on you.  Please come tomorrow for labs as discussed.  Allie Bossier, NP-C

## 2020-02-07 NOTE — Telephone Encounter (Signed)
Patient has been eating a little better. Has had lots of fluids. BP was 116/62. Patient daughter asked for prednisone let her know that may not be option for him at this time will let you know. Asked if he would be able to get antibody infusion.

## 2020-02-07 NOTE — Progress Notes (Signed)
Subjective:    Patient ID: [name], male    DOB: [birth year], 87 y.o.   MRN: [id number]  HPI  Virtual Visit via Video Note  I connected with [name] on 02/07/20 at  9:40 AM EST by a video enabled telemedicine application and verified that I am speaking with the correct person using two identifiers.  We attempted to connect via video, there was a poor connection so we had to conduct our visit via phone which lasted 15 minutes and 3 seconds.  Location: Patient: Home Provider: Office Participants: Patient, myself, his daughter   I discussed the limitations of evaluation and management by telemedicine and the availability of in person appointments. The patient expressed understanding and agreed to proceed.  History of Present Illness:  Mr. Wafer is a 87 year old male with a history of hypertension, sleep apnea, BPH, pernicious anemia, depression, chronic fatigue, recent Covid-19 infection who presents today with his daughter to discuss Covid-19 symptoms.  He [facility] to [facility]20 with a 2-week history of weakness, fatigue that had progressed over the last several days.  He also endorsed chest pain that was worse with deep inspiration, shortness of breath intermittently.  Also dry cough.  During his stay in the ED he was found to be tachycardic and febrile.  Exam was "reassuring".  Labs were ordered which did not show evidence of ACS.  CBC with thrombocytopenia with level of 29.  Chest x-ray was negative for acute process.  CTA chest without PE, but did note bilateral multifocal airspace opacities consistent with atypical viral pneumonia.  He was able to ambulate in the room with oxygen saturation of 95% so he was discharged home.  Since his discharge his family has called our office requesting prednisone, antibiotics, and cough medicine.  We were told by his family that he was lethargic and weak, not eating or drinking over the past 24 hours.  For this reason we  recommended we call paramedics to take him to the emergency department for IV hydration and treatment.  Both he and his family declined despite our recommendations.  Today he's feeling weak, he's not drinking water, had 2 cups of coffee and half a bottle of vitamin water today. His daughter has been giving him Mucinex and Best boy with improvement in cough. He endorses that he's no longer coughing up thick, yellow mucous. He continues to cough which is about the same, mostly dry.  His daughter endorses that he ate last night, she fixed him some eggs this morning for which he has not yet eaten.  He feels that he can be treated better at home rather than in the hospital and continues to refuse EMS and hospital treatment.  His blood pressure today was 85/63.   Observations/Objective:  Alert and oriented. No distress. Speaking in complete sentences. Dry cough noted once during visit.  Assessment and Plan:  Weakness, refusing to eat or drink over the last 36 hours. We have continuously recommended hospital evaluation for dehydration and he continues to decline, including family.  We discussed that he could potentially pass away if not treated.  He verbalized understanding.  I stressed the absolute need to drink water and replenish nutrients. I also stressed that should he rise from his chair he will need someone to help him stand up and ambulate given his blood pressure.  He does agree to come tomorrow for some updated labs. I will send in a prescription for Tessalon Perles per his  request. We will be in touch with him later tonight and also tomorrow morning for an update.  Follow Up Instructions:  You must drink water for hydration. Try to eat small meals throughout the day when able.  We will call you tonight and tomorrow morning to check on you.  Please come tomorrow for labs as discussed.  Allie Bossier, NP-C    I discussed the assessment and treatment plan with the patient.  The patient was provided an opportunity to ask questions and all were answered. The patient agreed with the plan and demonstrated an understanding of the instructions.   The patient was advised to call back or seek an in-person evaluation if the symptoms worsen or if the condition fails to improve as anticipated.    Pleas Koch, NP    Review of Systems  Constitutional: Positive for appetite change and fatigue. Negative for fever.  HENT: Positive for congestion.   Respiratory: Positive for cough and shortness of breath.   Cardiovascular: Negative for chest pain.  Neurological: Positive for weakness.       Past Medical History:  Diagnosis Date  . Arthralgia of multiple joints    limited mobility w/  independant adl's  . BPH with obstruction/lower urinary tract symptoms   . Chronic idiopathic monocytosis    followed by dr Waymon Budge--  per lov note 06/ 2017 persistant unexplained  with normal bone barrow bx  . Coronary atherosclerosis of unspecified type of vessel, native or graft    cardiologist-  dr Stanford Breed -- per lov note 11-19-2014 ,  cardiac cath in 2000-- pLAD 60-70%,  small intermediate branch 70-80%,  mRCA 20%,  normal LV  . Degenerative arthritis of spine    cervical and lumar  . Diverticulosis of colon (without mention of hemorrhage)   . Dysuria    chronic  . Elevated PSA   . Feeling of incomplete bladder emptying   . Hiatal hernia    moderate per ct 11/ 2017  . History of adenomatous polyp of colon    2008  . History of atrial fibrillation    remote hx episode  . History of pancreatitis    07-01-2013  and 10-10-2015  . History of prostatitis    2014  . Hx of colonic polyps   . Hyperlipidemia   . Hypertension   . Mouth sore    roof of mouth sore   . Nephrolithiasis   . Nocturia more than twice per night    severe  w/ leakage  . OSA (obstructive sleep apnea)    INTOLERANT CPAP  . Osteoarthritis   . Pernicious anemia    B12  Def.  . Peyronie  disease   . Renal insufficiency   . Sepsis ([place]) 03/22/2017  . Thrombocytopenia, unspecified ([place]) hemotology/oncologist-  dr Waymon Budge   per dr Velta Addison note 06/ 2016  secondary to vitro clumping     Social History   Socioeconomic History  . Marital status: Widowed    Spouse name: Not on file  . Number of children: 2  . Years of education: Not on file  . Highest education level: Not on file  Occupational History  . Occupation: retired    Fish farm manager: RETIRED  Tobacco Use  . Smoking status: Former Smoker    Types: Pipe    Quit date: 01/18/1966    Years since quitting: 54.0  . Smokeless tobacco: Never Used  Vaping Use  . Vaping Use: Never used  Substance and Sexual Activity  .  Alcohol use: No    Alcohol/week: 0.0 standard drinks  . Drug use: No  . Sexual activity: Not Currently  Other Topics Concern  . Not on file  Social History Narrative   DNR   Widowed   2 daughters leave near by   Social Determinants of Health   Financial Resource Strain: Not on file  Food Insecurity: Not on file  Transportation Needs: Not on file  Physical Activity: Not on file  Stress: Not on file  Social Connections: Not on file  Intimate Partner Violence: Not on file    Past Surgical History:  Procedure Laterality Date  . BONE MARROW BIOPSY  2011   normal  . CARDIAC CATHETERIZATION  2000   per dr Kathyrn Drown note --  dLAD 60-70%,  small intermediate branch 70-80%,  mRCA 20%,  normal LV  . CARDIOVASCULAR STRESS TEST  01-29-2013   dr Stanford Breed   normal nuclear study w/ no ischemia,  normal LV function and wall motion , ef 82%  . CATARACT EXTRACTION W/ INTRAOCULAR LENS  IMPLANT, BILATERAL  08/2015  . CHOLECYSTECTOMY  11/24/2010   Procedure: LAPAROSCOPIC CHOLECYSTECTOMY WITH INTRAOPERATIVE CHOLANGIOGRAM;  Surgeon: Earnstine Regal, MD;  Location: [facility];  Service: General;  Laterality: N/A;  c-arm  . CYSTOSCOPY W/ URETERAL STENT PLACEMENT Left 12/17/2015   Procedure: CYSTOSCOPY WITH  RETROGRADE PYELOGRAM/URETERAL STENT PLACEMENT;  Surgeon: Ardis Hughs, MD;  Location: [facility];  Service: Urology;  Laterality: Left;  . CYSTOSCOPY WITH RETROGRADE PYELOGRAM, URETEROSCOPY AND STENT PLACEMENT Left 12/22/2015   Procedure: CYSTOSCOPY WITH LEFT RETROGRADE  URETEROSCOPY AND STENT PLACEMENT;  Surgeon: Carolan Clines, MD;  Location: [facility];  Service: Urology;  Laterality: Left;  . ERCP N/A 07/04/2013   Procedure: ENDOSCOPIC RETROGRADE CHOLANGIOPANCREATOGRAPHY (ERCP);  Surgeon: Gatha Mayer, MD;  Location: [facility];  Service: Endoscopy;  Laterality: N/A;  MAC if available  . ERCP N/A 10/12/2015   Procedure: ENDOSCOPIC RETROGRADE CHOLANGIOPANCREATOGRAPHY (ERCP);  Surgeon: [name], MD;  Location: [facility];  Service: Endoscopy;  Laterality: N/A;  . HOLMIUM LASER APPLICATION Left 93/02/3555   Procedure: HOLMIUM LASER APPLICATION;  Surgeon: Carolan Clines, MD;  [facility];  Service: Urology;  Laterality: Left;  . INGUINAL HERNIA REPAIR Right 01/25/2002  . KNEE ARTHROSCOPY  x5  . SATURATION BIOPSY OF PROSTATE  05-29-2007  and 01-26-2008  . SHOULDER ARTHROSCOPY WITH OPEN ROTATOR CUFF REPAIR AND DISTAL CLAVICLE ACROMINECTOMY Right 11/19/2004  . TOTAL HIP ARTHROPLASTY Right 05/21/2009  . TOTAL KNEE ARTHROPLASTY Bilateral left 05-02-2003/  right  03-23-2010  . TRANSTHORACIC ECHOCARDIOGRAM  12/23/2004   ef 60%, mild MV calcification without stenosis/  mild TR,  PASP 51mHg  . UVULOPALATOPHARYNGOPLASTY  1993    w/  T & A    Family History  Problem Relation Age of Onset  . Colon cancer Father 444 . Peripheral vascular disease Mother     Allergies  Allergen Reactions  . Penicillins Hives and Other (See Comments)    [place], passed out Tolerates cephalosporins  Has patient had a PCN reaction causing immediate rash, facial/tongue/throat swelling, SOB or lightheadedness with hypotension:  yes Has patient had a PCN reaction  causing severe rash involving mucus membranes or skin necrosis: no Has patient had a PCN reaction that required hospitalization: no Has patient had a PCN reaction occurring within the last 10 years: no If all of the above answers are "NO", then may proceed with Cephalosporin use.     Current  Outpatient Medications on File Prior to Visit  Medication Sig Dispense Refill  . acetaminophen (TYLENOL) 325 MG tablet Take 650 mg by mouth every 6 (six) hours as needed for mild pain.    . Artificial Tear Solution (TEARS NATURALE OP) Place 1-2 drops into both eyes at bedtime as needed (dry eyes).    . fluticasone (FLONASE) 50 MCG/ACT nasal spray Place 1 spray into both nostrils as needed for allergies or rhinitis.    . Loratadine (CLARITIN PO) Take 1 tablet by mouth daily.     . Multiple Vitamin (MULTIVITAMIN) tablet Take 1 tablet by mouth daily.    . tamsulosin (FLOMAX) 0.4 MG CAPS capsule Take 0.4 mg by mouth in the morning and at bedtime.     No current facility-administered medications on file prior to visit.    BP (!) 85/63   Pulse 85   Temp 98.6 F (37 C) (Oral)   Ht 5' (1.524 m)   Wt 145 lb (65.8 kg)   BMI 28.32 kg/m    Objective:   Physical Exam Pulmonary:     Effort: Pulmonary effort is normal.     Comments: Dry cough noted once during visit Neurological:     Mental Status: He is alert and oriented to person, place, and time.  Psychiatric:        Mood and Affect: Mood normal.            Assessment & Plan:

## 2020-02-08 ENCOUNTER — Other Ambulatory Visit (INDEPENDENT_AMBULATORY_CARE_PROVIDER_SITE_OTHER): Payer: Medicare HMO

## 2020-02-08 ENCOUNTER — Other Ambulatory Visit: Payer: Self-pay | Admitting: Primary Care

## 2020-02-08 DIAGNOSIS — U071 COVID-19: Secondary | ICD-10-CM

## 2020-02-08 NOTE — Telephone Encounter (Signed)
Called and spoke to daughter patient was up all night urinating and has been resting more today. She did check his bp this am about 8:50 and it was 118/63 with HR 92 and tem 97.2. He did run a little fever over night but came down with tylenol. Asked to check on if he can get sent for infusion

## 2020-02-08 NOTE — Telephone Encounter (Signed)
Noted  

## 2020-02-08 NOTE — Telephone Encounter (Signed)
Please thank her for this update, I am very relieved to learn that he's doing better! Continue to encourage hydration with water/fluids, advance diet as tolerated. Continue Tylenol if needed for fevers. Take tessalon perles for cough if needed.  We will call Monday next week to check on him next.

## 2020-02-08 NOTE — Telephone Encounter (Signed)
Called spoke to daughter gave all information. App made for labs and informed we will call Monday to check on him

## 2020-02-09 LAB — CBC WITH DIFFERENTIAL/PLATELET
Absolute Monocytes: 4485 cells/uL — ABNORMAL HIGH (ref 200–950)
Basophils Absolute: 30 cells/uL (ref 0–200)
Basophils Relative: 0.4 %
Eosinophils Absolute: 83 cells/uL (ref 15–500)
Eosinophils Relative: 1.1 %
HCT: 46.3 % (ref 38.5–50.0)
Hemoglobin: 15.5 g/dL (ref 13.2–17.1)
Lymphs Abs: 645 cells/uL — ABNORMAL LOW (ref 850–3900)
MCH: 27.8 pg (ref 27.0–33.0)
MCHC: 33.5 g/dL (ref 32.0–36.0)
MCV: 83 fL (ref 80.0–100.0)
MPV: 14.2 fL — ABNORMAL HIGH (ref 7.5–12.5)
Monocytes Relative: 59.8 %
Neutro Abs: 4485 cells/uL (ref 1500–7800)
Neutrophils Relative %: 59.8 %
Platelets: 42 10*3/uL — ABNORMAL LOW (ref 140–400)
RBC: 5.58 10*6/uL (ref 4.20–5.80)
RDW: 13.3 % (ref 11.0–15.0)
Total Lymphocyte: 8.6 %
WBC: 7.5 10*3/uL (ref 3.8–10.8)

## 2020-02-09 LAB — BASIC METABOLIC PANEL
BUN: 16 mg/dL (ref 7–25)
CO2: 26 mmol/L (ref 20–32)
Calcium: 8.7 mg/dL (ref 8.6–10.3)
Chloride: 105 mmol/L (ref 98–110)
Creat: 1.02 mg/dL (ref 0.70–1.11)
Glucose, Bld: 130 mg/dL — ABNORMAL HIGH (ref 65–99)
Potassium: 4.8 mmol/L (ref 3.5–5.3)
Sodium: 141 mmol/L (ref 135–146)

## 2020-02-09 NOTE — Telephone Encounter (Signed)
Called and checked on patient. Daughter states bp last night was 134/71 and this morning it was 134/71. And no fever last night. Only symptom that is still concerning to him is cough. They are continuing with the tessalon and mucinex and drinking water.

## 2020-02-10 NOTE — Telephone Encounter (Signed)
Noted. If no more congestion then stop Mucinex. I can refill Tessalon Perles if needed.

## 2020-02-11 ENCOUNTER — Other Ambulatory Visit: Payer: Self-pay | Admitting: Primary Care

## 2020-02-11 DIAGNOSIS — U071 COVID-19: Secondary | ICD-10-CM

## 2020-02-11 NOTE — Telephone Encounter (Signed)
Noted, glad to hear. 

## 2020-02-11 NOTE — Telephone Encounter (Signed)
We spoke with the patient this morning who endorses cough is much better, so this isn't matching up. I will go ahead and refill his Tessalon Perles to use as needed.

## 2020-02-11 NOTE — Telephone Encounter (Signed)
Called and spoke with patient who stated that he is still having nasal congestion, but his cough is much better. Patient also stated that his blood pressure this morning was 130/70. Patient overall feels like he is doing well.

## 2020-02-11 NOTE — Telephone Encounter (Signed)
Patient's daughter Harless Nakayama left a voicemail stating that she really needs the pills for his cough refilled today,Tessalon. [name]  stated that she is having surgery tomorrow and needs to get this taken care of today. [name] requested a call back that this has been taken care of. Pharmacy [facility]

## 2020-02-13 ENCOUNTER — Ambulatory Visit: Payer: Medicare HMO

## 2020-02-19 ENCOUNTER — Other Ambulatory Visit: Payer: Self-pay

## 2020-02-19 ENCOUNTER — Ambulatory Visit: Payer: Medicare HMO

## 2020-02-20 ENCOUNTER — Ambulatory Visit (INDEPENDENT_AMBULATORY_CARE_PROVIDER_SITE_OTHER): Payer: Medicare HMO

## 2020-02-20 ENCOUNTER — Telehealth: Payer: Self-pay

## 2020-02-20 ENCOUNTER — Other Ambulatory Visit: Payer: Self-pay

## 2020-02-20 DIAGNOSIS — E538 Deficiency of other specified B group vitamins: Secondary | ICD-10-CM

## 2020-02-20 MED ORDER — CYANOCOBALAMIN 1000 MCG/ML IJ SOLN
1000.0000 ug | Freq: Once | INTRAMUSCULAR | Status: AC
  Administered 2020-02-20: 1000 ug via INTRAMUSCULAR

## 2020-02-20 NOTE — Telephone Encounter (Signed)
That's great.

## 2020-02-20 NOTE — Telephone Encounter (Signed)
Called patient per his request I have made follow up with urology. He has been scheduled for 02/27/2020 arrive at 1:00. He has been informed of this appointment. I have also made follow up with our office on 2/16. He will call if any new questions or changes.

## 2020-02-20 NOTE — Telephone Encounter (Addendum)
Please thank patient for the update, I am sorry that he is feeling so fatigued and down. I am happy to evaluate him and complete some lab work if he'd like.  I have also stressed to him, on numerous visits, to follow-up with his urologist given his urinary symptoms. Please have him call his urologist for an appointment.

## 2020-02-20 NOTE — Telephone Encounter (Signed)
Patient came today for his monthly B12 shot. Patient expressed concerns of having mucus in his chest that he is unable to cough up. Patient stated that he has been taking mucinex without relief. Patient denied fever, SOB, or chest pain. Patient stated that he is weak and fatigued. Patient also expressed concerns of when he urinates, he is only able to urinate a small amount at a time and gets up approximately every two hours despite the use of Flomax. Patient stated that he feels lonely since he lives by himself and is practicing social distancing to prevent from getting [place] again. Patient denies SI or HI. This RN provided emotional support to patient. Mr. Bakken stated that he just wanted to make [employer] aware. Also, provided education to patient about drinking plenty of fluids, eating a well balanced diet, and getting rest. Patient states that he doesn't have much of an appetite, but does drink an Ensure once daily. UC precautions given to patient. Patient verbalized understanding.

## 2020-02-20 NOTE — Progress Notes (Signed)
Per orders of Kate Clark, NP, injection of B12, given by Adelai Achey, RN. Patient tolerated injection well in R Deltoid.   

## 2020-02-27 DIAGNOSIS — R3912 Poor urinary stream: Secondary | ICD-10-CM | POA: Diagnosis not present

## 2020-02-27 DIAGNOSIS — R350 Frequency of micturition: Secondary | ICD-10-CM | POA: Diagnosis not present

## 2020-02-27 DIAGNOSIS — R351 Nocturia: Secondary | ICD-10-CM | POA: Diagnosis not present

## 2020-02-27 DIAGNOSIS — N401 Enlarged prostate with lower urinary tract symptoms: Secondary | ICD-10-CM | POA: Diagnosis not present

## 2020-03-05 ENCOUNTER — Encounter: Payer: Self-pay | Admitting: Primary Care

## 2020-03-05 ENCOUNTER — Other Ambulatory Visit: Payer: Self-pay

## 2020-03-05 ENCOUNTER — Ambulatory Visit (INDEPENDENT_AMBULATORY_CARE_PROVIDER_SITE_OTHER): Payer: Medicare HMO | Admitting: Primary Care

## 2020-03-05 VITALS — BP 120/62 | HR 88 | Temp 97.6°F | Ht 60.0 in | Wt 136.0 lb

## 2020-03-05 DIAGNOSIS — R5382 Chronic fatigue, unspecified: Secondary | ICD-10-CM | POA: Diagnosis not present

## 2020-03-05 DIAGNOSIS — N401 Enlarged prostate with lower urinary tract symptoms: Secondary | ICD-10-CM

## 2020-03-05 DIAGNOSIS — K120 Recurrent oral aphthae: Secondary | ICD-10-CM | POA: Diagnosis not present

## 2020-03-05 DIAGNOSIS — R351 Nocturia: Secondary | ICD-10-CM

## 2020-03-05 DIAGNOSIS — U071 COVID-19: Secondary | ICD-10-CM | POA: Diagnosis not present

## 2020-03-05 DIAGNOSIS — F32A Depression, unspecified: Secondary | ICD-10-CM | POA: Diagnosis not present

## 2020-03-05 DIAGNOSIS — R69 Illness, unspecified: Secondary | ICD-10-CM | POA: Diagnosis not present

## 2020-03-05 DIAGNOSIS — Z8616 Personal history of COVID-19: Secondary | ICD-10-CM | POA: Insufficient documentation

## 2020-03-05 HISTORY — DX: Personal history of COVID-19: Z86.16

## 2020-03-05 MED ORDER — TRIAMCINOLONE ACETONIDE 0.1 % MT PSTE: PASTE | OROMUCOSAL | 0 refills | Status: DC

## 2020-03-05 MED ORDER — SERTRALINE HCL 25 MG PO TABS: 25.0000 mg | ORAL_TABLET | Freq: Every day | ORAL | 1 refills | Status: DC

## 2020-03-05 NOTE — Patient Instructions (Signed)
Start sertraline (Zoloft) 25 mg for depression. Take 1 tablet by mouth at bedtime for depression.  You can apply the triamcinolone paste twice daily to your tongue for pain and healing.   Please schedule a follow up visit for 6 weeks for follow up of anxiety/depression.  It was a pleasure to see you today!

## 2020-03-05 NOTE — Assessment & Plan Note (Signed)
Acute, noted to the left tip of tongue. Treat with triamcinolone paste BID.  He will update.

## 2020-03-05 NOTE — Assessment & Plan Note (Addendum)
Diagnosed in January 2022, difficult course including dehydration and increased depression.  Today lungs are clear, he appears well. He does have an oral ulcer, will treat.  Encouraged daily activity, continue with working as preferred. Will also be treating depression.   Encouraged him to obtain his Covid Booster vaccine.

## 2020-03-05 NOTE — Progress Notes (Signed)
Subjective:    Patient ID: [name], male    DOB: Aug 18, 1933, 87 y.o.   MRN: [id number]  HPI  This visit occurred during the SARS-CoV-2 public health emergency.  Safety protocols were in place, including screening questions prior to the visit, additional usage of staff PPE, and extensive cleaning of exam room while observing appropriate contact time as indicated for disinfecting solutions.   [name] is a 87 year old male with a history of hypertension, CAD, BPH, hyperlipidemia, Covid-19 infection, elevated PSA who presents today for follow up from Covid-19 infection.   Diagnosed with Covid-19 in mid January 2022, evaluated in the ED on 02/05/20, was sent home with instructions for conservative treatment. Unfortunately he struggled with little appetite, decreased oral intake, became hypotensive at home. He completed two Pfizer vaccines in Mar 11, 2019.  Today he's concerned due to ongoing fatigue and altered taste since Covid-19 infection. Overall he's feeling much better, most Covid-19 symptoms have resolved.   He developed a sore on his tongue about one week ago, doesn't remember trauma to the tongue. He is working to stay hydrated with tea, lemonade, Pedialyte, Ensure. He is drinking little water as he doesn't like the taste. He is eating one meal daily due to altered taste.   He has returned to work five days weekly which has helped with fatigue and overall mood. He endorses chronic depression for years since his wife passed away in 03/10/04. During Covid infection his depression increased, but he's feeling better now that he's back at work. He is fearful of taking medication for depression as he's known other people to become unlike themselves.   PHQ9 SCORE ONLY 03/05/2020 01/24/2020 07/17/2018  PHQ-9 Total Score 14 0 1     He was evaluated by Urology last week, was told to return in two months. He is compliant to his Flomax 0.4 mg daily.    Review of Systems  Constitutional:  Positive for appetite change.  HENT:       Altered taste, tongue sore  Respiratory: Negative for shortness of breath.   Cardiovascular: Negative for chest pain.  Psychiatric/Behavioral:       Depression, see HPI       Past Medical History:  Diagnosis Date  . Arthralgia of multiple joints    limited mobility w/  independant adl's  . BPH with obstruction/lower urinary tract symptoms   . Chronic idiopathic monocytosis    followed by dr Waymon Budge--  per lov note 06/ 2015-03-11 persistant unexplained  with normal bone barrow bx  . Coronary atherosclerosis of unspecified type of vessel, native or graft    cardiologist-  dr Stanford Breed -- per lov note 11-19-2014 ,  cardiac cath in 03/10/1998-- pLAD 60-70%,  small intermediate branch 70-80%,  mRCA 20%,  normal LV  . Degenerative arthritis of spine    cervical and lumar  . Diverticulosis of colon (without mention of hemorrhage)   . Dysuria    chronic  . Elevated PSA   . Feeling of incomplete bladder emptying   . Hiatal hernia    moderate per ct 11/ 11-Mar-2015  . History of adenomatous polyp of colon    03/10/06  . History of atrial fibrillation    remote hx episode  . History of pancreatitis    07-01-2013  and 10-10-2015  . History of prostatitis    03-10-2012  . Hx of colonic polyps   . Hyperlipidemia   . Hypertension   . Mouth sore  roof of mouth sore   . Nephrolithiasis   . Nocturia more than twice per night    severe  w/ leakage  . OSA (obstructive sleep apnea)    INTOLERANT CPAP  . Osteoarthritis   . Pernicious anemia    B12  Def.  . Peyronie disease   . Renal insufficiency   . Sepsis ([place]) 03/22/2017  . Thrombocytopenia, unspecified ([place]) hemotology/oncologist-  dr Waymon Budge   per dr Velta Addison note 06/ 2016  secondary to vitro clumping     Social History   Socioeconomic History  . Marital status: Widowed    Spouse name: Not on file  . Number of children: 2  . Years of education: Not on file  . Highest education level: Not on  file  Occupational History  . Occupation: retired    Fish farm manager: RETIRED  Tobacco Use  . Smoking status: Former Smoker    Types: Pipe    Quit date: 01/18/1966    Years since quitting: 54.1  . Smokeless tobacco: Never Used  Vaping Use  . Vaping Use: Never used  Substance and Sexual Activity  . Alcohol use: No    Alcohol/week: 0.0 standard drinks  . Drug use: No  . Sexual activity: Not Currently  Other Topics Concern  . Not on file  Social History Narrative   DNR   Widowed   2 daughters leave near by   Social Determinants of Health   Financial Resource Strain: Not on file  Food Insecurity: Not on file  Transportation Needs: Not on file  Physical Activity: Not on file  Stress: Not on file  Social Connections: Not on file  Intimate Partner Violence: Not on file    Past Surgical History:  Procedure Laterality Date  . BONE MARROW BIOPSY  2011   normal  . CARDIAC CATHETERIZATION  2000   per dr Kathyrn Drown note --  dLAD 60-70%,  small intermediate branch 70-80%,  mRCA 20%,  normal LV  . CARDIOVASCULAR STRESS TEST  01-29-2013   dr Stanford Breed   normal nuclear study w/ no ischemia,  normal LV function and wall motion , ef 82%  . CATARACT EXTRACTION W/ INTRAOCULAR LENS  IMPLANT, BILATERAL  08/2015  . CHOLECYSTECTOMY  11/24/2010   Procedure: LAPAROSCOPIC CHOLECYSTECTOMY WITH INTRAOPERATIVE CHOLANGIOGRAM;  Surgeon: Earnstine Regal, MD;  Location: [facility];  Service: General;  Laterality: N/A;  c-arm  . CYSTOSCOPY W/ URETERAL STENT PLACEMENT Left 12/17/2015   Procedure: CYSTOSCOPY WITH RETROGRADE PYELOGRAM/URETERAL STENT PLACEMENT;  Surgeon: Ardis Hughs, MD;  Location: [facility];  Service: Urology;  Laterality: Left;  . CYSTOSCOPY WITH RETROGRADE PYELOGRAM, URETEROSCOPY AND STENT PLACEMENT Left 12/22/2015   Procedure: CYSTOSCOPY WITH LEFT RETROGRADE  URETEROSCOPY AND STENT PLACEMENT;  Surgeon: Carolan Clines, MD;  Location[facility];  Service: Urology;  Laterality:  Left;  . ERCP N/A 07/04/2013   Procedure: ENDOSCOPIC RETROGRADE CHOLANGIOPANCREATOGRAPHY (ERCP);  Surgeon: Gatha Mayer, MD;  Location: [facility];  Service: Endoscopy;  Laterality: N/A;  MAC if available  . ERCP N/A 10/12/2015   Procedure: ENDOSCOPIC RETROGRADE CHOLANGIOPANCREATOGRAPHY (ERCP);  Surgeon: [name], MD;  Location: [facility];  Service: Endoscopy;  Laterality: N/A;  . HOLMIUM LASER APPLICATION Left 23/05/5730   Procedure: HOLMIUM LASER APPLICATION;  Surgeon: Carolan Clines, MD;  Location: [facility];  Service: Urology;  Laterality: Left;  . INGUINAL HERNIA REPAIR Right 01/25/2002  . KNEE ARTHROSCOPY  x5  . SATURATION BIOPSY OF PROSTATE  05-29-2007  and  01-26-2008  . SHOULDER ARTHROSCOPY WITH OPEN ROTATOR CUFF REPAIR AND DISTAL CLAVICLE ACROMINECTOMY Right 11/19/2004  . TOTAL HIP ARTHROPLASTY Right 05/21/2009  . TOTAL KNEE ARTHROPLASTY Bilateral left 05-02-2003/  right  03-23-2010  . TRANSTHORACIC ECHOCARDIOGRAM  12/23/2004   ef 60%, mild MV calcification without stenosis/  mild TR,  PASP 35mHg  . UVULOPALATOPHARYNGOPLASTY  1993    w/  T & A    Family History  Problem Relation Age of Onset  . Colon cancer Father 464 . Peripheral vascular disease Mother     Allergies  Allergen Reactions  . Penicillins Hives and Other (See Comments)    Whelps, passed out Tolerates cephalosporins  Has patient had a PCN reaction causing immediate rash, facial/tongue/throat swelling, SOB or lightheadedness with hypotension:  yes Has patient had a PCN reaction causing severe rash involving mucus membranes or skin necrosis: no Has patient had a PCN reaction that required hospitalization: no Has patient had a PCN reaction occurring within the last 10 years: no If all of the above answers are "NO", then may proceed with Cephalosporin use.     Current Outpatient Medications on File Prior to Visit  Medication Sig Dispense Refill  . acetaminophen (TYLENOL) 325 MG  tablet Take 650 mg by mouth every 6 (six) hours as needed for mild pain.    .Marland Kitchenalbuterol (VENTOLIN HFA) 108 (90 Base) MCG/ACT inhaler Inhale into the lungs.    . Artificial Tear Solution (TEARS NATURALE OP) Place 1-2 drops into both eyes at bedtime as needed (dry eyes).    . fluticasone (FLONASE) 50 MCG/ACT nasal spray Place 1 spray into both nostrils as needed for allergies or rhinitis.    . Loratadine (CLARITIN PO) Take 1 tablet by mouth daily.     . Multiple Vitamin (MULTIVITAMIN) tablet Take 1 tablet by mouth daily.    . tamsulosin (FLOMAX) 0.4 MG CAPS capsule Take 0.4 mg by mouth in the morning and at bedtime.    . benzonatate (TESSALON) 200 MG capsule TAKE 1 CAPSULE (200 MG TOTAL) BY MOUTH 3 (THREE) TIMES DAILY AS NEEDED FOR COUGH. (Patient not taking: Reported on 03/05/2020) 15 capsule 0   No current facility-administered medications on file prior to visit.    BP 120/62   Pulse 88   Temp 97.6 F (36.4 C) (Temporal)   Ht 5' (1.524 m)   Wt 136 lb (61.7 kg)   SpO2 97%   BMI 26.56 kg/m    Objective:   Physical Exam Constitutional:      Appearance: He is well-nourished.  Cardiovascular:     Rate and Rhythm: Normal rate and regular rhythm.  Pulmonary:     Effort: Pulmonary effort is normal.     Breath sounds: Normal breath sounds.  Musculoskeletal:     Cervical back: Neck supple.  Skin:    General: Skin is warm and dry.  Psychiatric:        Mood and Affect: Mood and affect and mood normal.            Assessment & Plan:

## 2020-03-05 NOTE — Assessment & Plan Note (Signed)
Chronic, worse since Covid infection. Also suspect depression to be playing a role. Will continue to monitor and also treat depression.

## 2020-03-05 NOTE — Assessment & Plan Note (Signed)
Evaluated by Urology last week, they are following him closely for BPH symptoms.

## 2020-03-05 NOTE — Assessment & Plan Note (Signed)
Chronic for years, admits he is ready for treatment. Discussed options, he opts for medication.   Rx for Zoloft 25 mg sent to pharmacy. Discussed instructions for starting.   We discussed possible side effects of headache, GI upset, drowsiness, and SI/HI. If thoughts of SI/HI develop, we discussed to present to the emergency immediately. Patient verbalized understanding.   Follow up in 6 weeks for re-evaluation.

## 2020-03-18 ENCOUNTER — Other Ambulatory Visit: Payer: Self-pay

## 2020-03-18 ENCOUNTER — Ambulatory Visit: Payer: Medicare HMO

## 2020-04-02 ENCOUNTER — Ambulatory Visit (INDEPENDENT_AMBULATORY_CARE_PROVIDER_SITE_OTHER): Payer: Medicare HMO

## 2020-04-02 ENCOUNTER — Other Ambulatory Visit: Payer: Self-pay

## 2020-04-02 DIAGNOSIS — E538 Deficiency of other specified B group vitamins: Secondary | ICD-10-CM

## 2020-04-02 MED ORDER — CYANOCOBALAMIN 1000 MCG/ML IJ SOLN
1000.0000 ug | Freq: Once | INTRAMUSCULAR | Status: AC
  Administered 2020-04-02: 1000 ug via INTRAMUSCULAR

## 2020-04-02 NOTE — Progress Notes (Signed)
Patient presented for B 12 injection given by Skyelar Halliday, CMA to right deltoid, patient voiced no concerns nor showed any signs of distress during injection.  

## 2020-04-25 ENCOUNTER — Encounter: Payer: Self-pay | Admitting: Primary Care

## 2020-04-25 ENCOUNTER — Ambulatory Visit (INDEPENDENT_AMBULATORY_CARE_PROVIDER_SITE_OTHER): Payer: Medicare HMO | Admitting: Primary Care

## 2020-04-25 ENCOUNTER — Other Ambulatory Visit: Payer: Self-pay

## 2020-04-25 DIAGNOSIS — F32A Depression, unspecified: Secondary | ICD-10-CM

## 2020-04-25 DIAGNOSIS — R69 Illness, unspecified: Secondary | ICD-10-CM | POA: Diagnosis not present

## 2020-04-25 NOTE — Patient Instructions (Signed)
Schedule for monthly B12 injections up front.   It was a pleasure to see you today!

## 2020-04-25 NOTE — Assessment & Plan Note (Signed)
About the same, decided not to take Zoloft 25 mg due to concerns of potential side effects.   Removed Zoloft 25 mg from medication list. Offered therapy vs other medication alternative for which he kindly declines. We did discuss that anti-depressant medication could help with chronic fatigue as well as other symptoms.   Continue to monitor. He will update.

## 2020-04-25 NOTE — Progress Notes (Signed)
Subjective:    Patient ID: [name], male    DOB: 1934-01-06, 87 y.o.   MRN: [id number]  HPI  [name] is a very pleasant 87 y.o. male with a history of vitamin B 12 deficiency, BPH, hypertension, chronic fatigue, depression, Covid-19,  who presents today for follow up of depression.  He was last evaluated on 03/05/20 for symptoms of depression, long history, untreated. During his visit we decided to initiate Zoloft 25 mg, he is here today for follow up.  Since his last visit he decided not to take Zoloft as he was concerned about the possible side effects. He does not wish to take medication for his depression. He's decided not to surround himself with people who are speaking negatively about the world.   He continues to be tired/fatigued. He denies worsening depression, SI/HI.    Review of Systems  Constitutional: Positive for fatigue.  Psychiatric/Behavioral:       See HPI         Past Medical History:  Diagnosis Date  . Arthralgia of multiple joints    limited mobility w/  independant adl's  . BPH with obstruction/lower urinary tract symptoms   . Chronic idiopathic monocytosis    followed by dr Waymon Budge--  per lov note 06/ 2017 persistant unexplained  with normal bone barrow bx  . Coronary atherosclerosis of unspecified type of vessel, native or graft    cardiologist-  dr Stanford Breed -- per lov note 11-19-2014 ,  cardiac cath in 2000-- pLAD 60-70%,  small intermediate branch 70-80%,  mRCA 20%,  normal LV  . Degenerative arthritis of spine    cervical and lumar  . Diverticulosis of colon (without mention of hemorrhage)   . Dysuria    chronic  . Elevated PSA   . Feeling of incomplete bladder emptying   . Hiatal hernia    moderate per ct 11/ 2017  . History of adenomatous polyp of colon    2008  . History of atrial fibrillation    remote hx episode  . History of pancreatitis    07-01-2013  and 10-10-2015  . History of prostatitis    2014  . Hx of  colonic polyps   . Hyperlipidemia   . Hypertension   . Mouth sore    roof of mouth sore   . Nephrolithiasis   . Nocturia more than twice per night    severe  w/ leakage  . OSA (obstructive sleep apnea)    INTOLERANT CPAP  . Osteoarthritis   . Pernicious anemia    B12  Def.  . Peyronie disease   . Renal insufficiency   . Sepsis ([place]) 03/22/2017  . Thrombocytopenia, unspecified ([place]) hemotology/oncologist-  dr Waymon Budge   per dr Velta Addison note 06/ 2016  secondary to vitro clumping    Social History   Socioeconomic History  . Marital status: Widowed    Spouse name: Not on file  . Number of children: 2  . Years of education: Not on file  . Highest education level: Not on file  Occupational History  . Occupation: retired    Fish farm manager: RETIRED  Tobacco Use  . Smoking status: Former Smoker    Types: Pipe    Quit date: 01/18/1966    Years since quitting: 54.3  . Smokeless tobacco: Never Used  Vaping Use  . Vaping Use: Never used  Substance and Sexual Activity  . Alcohol use: No    Alcohol/week: 0.0 standard drinks  .  Drug use: No  . Sexual activity: Not Currently  Other Topics Concern  . Not on file  Social History Narrative   DNR   Widowed   2 daughters leave near by   Social Determinants of Health   Financial Resource Strain: Not on file  Food Insecurity: Not on file  Transportation Needs: Not on file  Physical Activity: Not on file  Stress: Not on file  Social Connections: Not on file  Intimate Partner Violence: Not on file    Past Surgical History:  Procedure Laterality Date  . BONE MARROW BIOPSY  2011   normal  . CARDIAC CATHETERIZATION  2000   per dr Kathyrn Drown note --  dLAD 60-70%,  small intermediate branch 70-80%,  mRCA 20%,  normal LV  . CARDIOVASCULAR STRESS TEST  01-29-2013   dr Stanford Breed   normal nuclear study w/ no ischemia,  normal LV function and wall motion , ef 82%  . CATARACT EXTRACTION W/ INTRAOCULAR LENS  IMPLANT, BILATERAL   08/2015  . CHOLECYSTECTOMY  11/24/2010   Procedure: LAPAROSCOPIC CHOLECYSTECTOMY WITH INTRAOPERATIVE CHOLANGIOGRAM;  Surgeon: Earnstine Regal, MD;  Location: [facility];  Service: General;  Laterality: N/A;  c-arm  . CYSTOSCOPY W/ URETERAL STENT PLACEMENT Left 12/17/2015   Procedure: CYSTOSCOPY WITH RETROGRADE PYELOGRAM/URETERAL STENT PLACEMENT;  Surgeon: Ardis Hughs, MD;  Location: [facility];  Service: Urology;  Laterality: Left;  . CYSTOSCOPY WITH RETROGRADE PYELOGRAM, URETEROSCOPY AND STENT PLACEMENT Left 12/22/2015   Procedure: CYSTOSCOPY WITH LEFT RETROGRADE  URETEROSCOPY AND STENT PLACEMENT;  Surgeon: Carolan Clines, MD;  Location: [facility];  Service: Urology;  Laterality: Left;  . ERCP N/A 07/04/2013   Procedure: ENDOSCOPIC RETROGRADE CHOLANGIOPANCREATOGRAPHY (ERCP);  Surgeon: Gatha Mayer, MD;  Location: [facility];  Service: Endoscopy;  Laterality: N/A;  MAC if available  . ERCP N/A 10/12/2015   Procedure: ENDOSCOPIC RETROGRADE CHOLANGIOPANCREATOGRAPHY (ERCP);  Surgeon: [name], MD;  Location: [facility];  Service: Endoscopy;  Laterality: N/A;  . HOLMIUM LASER APPLICATION Left 48/05/4625   Procedure: HOLMIUM LASER APPLICATION;  Surgeon: Carolan Clines, MD;  Location: [facility];  Service: Urology;  Laterality: Left;  . INGUINAL HERNIA REPAIR Right 01/25/2002  . KNEE ARTHROSCOPY  x5  . SATURATION BIOPSY OF PROSTATE  05-29-2007  and 01-26-2008  . SHOULDER ARTHROSCOPY WITH OPEN ROTATOR CUFF REPAIR AND DISTAL CLAVICLE ACROMINECTOMY Right 11/19/2004  . TOTAL HIP ARTHROPLASTY Right 05/21/2009  . TOTAL KNEE ARTHROPLASTY Bilateral left 05-02-2003/  right  03-23-2010  . TRANSTHORACIC ECHOCARDIOGRAM  12/23/2004   ef 60%, mild MV calcification without stenosis/  mild TR,  PASP 31mHg  . UVULOPALATOPHARYNGOPLASTY  1993    w/  T & A    Family History  Problem Relation Age of Onset  . Colon cancer Father 46 . Peripheral vascular disease Mother      Allergies  Allergen Reactions  . Penicillins Hives and Other (See Comments)    Whelps, passed out Tolerates cephalosporins  Has patient had a PCN reaction causing immediate rash, facial/tongue/throat swelling, SOB or lightheadedness with hypotension:  yes Has patient had a PCN reaction causing severe rash involving mucus membranes or skin necrosis: no Has patient had a PCN reaction that required hospitalization: no Has patient had a PCN reaction occurring within the last 10 years: no If all of the above answers are "NO", then may proceed with Cephalosporin use.     Current Outpatient Medications on File Prior to Visit  Medication Sig Dispense Refill  .  acetaminophen (TYLENOL) 325 MG tablet Take 650 mg by mouth every 6 (six) hours as needed for mild pain.    Marland Kitchen albuterol (VENTOLIN HFA) 108 (90 Base) MCG/ACT inhaler Inhale into the lungs.    . Artificial Tear Solution (TEARS NATURALE OP) Place 1-2 drops into both eyes at bedtime as needed (dry eyes).    . benzonatate (TESSALON) 200 MG capsule TAKE 1 CAPSULE (200 MG TOTAL) BY MOUTH 3 (THREE) TIMES DAILY AS NEEDED FOR COUGH. 15 capsule 0  . fluticasone (FLONASE) 50 MCG/ACT nasal spray Place 1 spray into both nostrils as needed for allergies or rhinitis.    . Loratadine (CLARITIN PO) Take 1 tablet by mouth daily.     . Multiple Vitamin (MULTIVITAMIN) tablet Take 1 tablet by mouth daily.    . tamsulosin (FLOMAX) 0.4 MG CAPS capsule Take 0.4 mg by mouth in the morning and at bedtime.    . triamcinolone (KENALOG) 0.1 % paste Apply to the tongue twice daily for 1-2 weeks. 5 g 0  . sertraline (ZOLOFT) 25 MG tablet Take 1 tablet (25 mg total) by mouth daily. For depression. (Patient not taking: Reported on 04/25/2020) 30 tablet 1   No current facility-administered medications on file prior to visit.    BP 118/64   Pulse 96   Temp (!) 96.4 F (35.8 C) (Temporal)   Ht 5' (1.524 m)   Wt 140 lb (63.5 kg)   SpO2 99%   BMI 27.34 kg/m   Objective:   Physical Exam Pulmonary:     Effort: Pulmonary effort is normal.     Breath sounds: Normal breath sounds. No wheezing or rales.  Musculoskeletal:     Cervical back: Neck supple.  Skin:    General: Skin is warm and dry.  Neurological:     Mental Status: He is alert and oriented to person, place, and time.  Psychiatric:        Mood and Affect: Mood normal.           Assessment & Plan:      This visit occurred during the SARS-CoV-2 public health emergency.  Safety protocols were in place, including screening questions prior to the visit, additional usage of staff PPE, and extensive cleaning of exam room while observing appropriate contact time as indicated for disinfecting solutions.

## 2020-05-06 ENCOUNTER — Ambulatory Visit (INDEPENDENT_AMBULATORY_CARE_PROVIDER_SITE_OTHER): Payer: Medicare HMO | Admitting: *Deleted

## 2020-05-06 ENCOUNTER — Other Ambulatory Visit: Payer: Self-pay

## 2020-05-06 DIAGNOSIS — E538 Deficiency of other specified B group vitamins: Secondary | ICD-10-CM

## 2020-05-06 MED ORDER — CYANOCOBALAMIN 1000 MCG/ML IJ SOLN
1000.0000 ug | Freq: Once | INTRAMUSCULAR | Status: AC
  Administered 2020-05-06: 1000 ug via INTRAMUSCULAR

## 2020-05-06 NOTE — Progress Notes (Signed)
Per orders of Allie Bossier, NP, injection of B12 given by Tammi Sou. Patient tolerated injection well.

## 2020-06-17 ENCOUNTER — Ambulatory Visit: Payer: Medicare HMO | Admitting: Family Medicine

## 2020-06-17 ENCOUNTER — Other Ambulatory Visit: Payer: Self-pay

## 2020-06-17 ENCOUNTER — Telehealth: Payer: Self-pay

## 2020-06-17 ENCOUNTER — Ambulatory Visit (INDEPENDENT_AMBULATORY_CARE_PROVIDER_SITE_OTHER): Payer: Medicare HMO | Admitting: Primary Care

## 2020-06-17 ENCOUNTER — Encounter: Payer: Self-pay | Admitting: Primary Care

## 2020-06-17 DIAGNOSIS — K219 Gastro-esophageal reflux disease without esophagitis: Secondary | ICD-10-CM

## 2020-06-17 DIAGNOSIS — K130 Diseases of lips: Secondary | ICD-10-CM

## 2020-06-17 DIAGNOSIS — K120 Recurrent oral aphthae: Secondary | ICD-10-CM | POA: Diagnosis not present

## 2020-06-17 NOTE — Telephone Encounter (Signed)
[name] TELEPHONE ADVICE RECORD AccessNurse Patient Name: [name] STO KES Gender: Male DOB: 03/24/1933 Age: 87 Y 79 M 18 D Return Phone Number: [phone] (Primary) Address: City/ State/ Zip: [place] [address] [place] [address] Client [facility] Night - Client Client Site [place] Physician Alma Friendly - NP Contact Type Call Who Is Calling Patient / Member / Family / Caregiver Call Type Triage / Clinical Relationship To Patient Self Return Phone Number [phone] (Primary) Chief Complaint Facial Swelling Reason for Call Symptomatic / Request for [facility] has mouth pain in roof of mouth. Plus swollen, painful tongue and lips. Caller wants an appointment Translation No Nurse Assessment Nurse: Kirk Ruths, RN, [facility] Date/Time Eilene Ghazi Time): 06/17/2020 7:06:33 AM Confirm and document reason for call. If symptomatic, describe symptoms. ---Caller states his mouth is sore, his lips are cracked open and swollen, his tongue has a spot on it white spot first seen it yesterday wants an appt this morning no fever throat is sore hurts to swallow hx of throat sx x 25 years ago for uvula and tonsil removal no trouble breathing Does the patient have any new or worsening symptoms? ---Yes Will a triage be completed? ---Yes Related visit to physician within the last 2 weeks? ---No Does the PT have any chronic conditions? (i.e. diabetes, asthma, this includes High risk factors for pregnancy, etc.) ---Yes List chronic conditions. ---prostate pill Is this a behavioral health or substance abuse call? ---No Guidelines Guideline Title Affirmed Question Affirmed Notes Nurse Date/Time Eilene Ghazi Time) Lip Swelling [1] Severe swelling AND [2] cause unknown Kirk Ruths, RN, [facility] 06/17/2020 7:09:54 AM Disp. Time Eilene Ghazi Time) Disposition Final User 06/17/2020 7:16:34 AM  Paged On Call back to [facility], RN, [facility] PLEASE NOTE: All timestamps contained within this report are represented as [place] Standard Time. CONFIDENTIALTY NOTICE: This fax transmission is intended only for the addressee. It contains information that is legally privileged, confidential or otherwise protected from use or disclosure. If you are not the intended recipient, you are strictly prohibited from reviewing, disclosing, copying using or disseminating any of this information or taking any action in reliance on or regarding this information. If you have received this fax in error, please notify us immediately by telephone so that we can arrange for its return to [place]. Phone: [phone], Toll-Free: [phone], Fax: [phone] Page: 2 of 2 Call Id: [id number] [place]. Time Eilene Ghazi Time) Disposition Final User 06/17/2020 7:45:10 AM Paged On Call back to [facility], RN, Mardene Celeste 06/17/2020 7:15:24 AM Go to ED Now (or PCP triage) Yes Kirk Ruths, RN, [facility] Caller Disagree/Comply Disagree Caller Understands Yes PreDisposition Call Doctor Care Advice Given Per Guideline GO TO ED NOW (OR PCP TRIAGE): * IF PCP SECOND-LEVEL TRIAGE REQUIRED: You may need to be seen. Your doctor (or NP/PA) will want to talk with you to decide what's best. I'll page the provider on-call now. If you haven't heard from the provider (or me) within 30 minutes, go directly to the [facility] at _____________ [place]: * Apply ice or a cold pack to the swelling for 20 minutes. This should reduce the swelling and the itch. * Repeat this if necessary. ANTIHISTAMINE (E.G., BENADRYL): * Take an antihistamine by mouth to reduce the swelling and to help with any itching. * Benadryl (diphenhydramine) every 6 hours is best. Adult dose is 25-50 mg. Take 2 or 3 times. CARE ADVICE given per Lip Swelling (Adult) guideline. *  If Benadryl is not available, use any hay fever or cold medicine that contains an  antihistamine. Referrals [facility] - ED Paging DoctorName Phone DateTime Result/ Outcome Message Type Notes Scarlette Calico - MD [phone] 06/17/2020 7:16:34 AM Paged On Call Back to Call Center Doctor Paged please call Kacey RN at [phone] thank Truett Mainland - MD [phone] 06/17/2020 7:45:10 AM Paged On Call Back to Call Center Doctor Paged [phone] Scarlette Calico - MD 06/17/2020 7:57:07 AM Spoke with On Call - General Message Result gave report, he wants the pt to call the office for an appointment this morning. I updated the pt, he will call office when they open

## 2020-06-17 NOTE — Assessment & Plan Note (Signed)
Non infectious. Appears chapped.  Will have him try some chapstick first, he's not tried anything. He will update.

## 2020-06-17 NOTE — Telephone Encounter (Signed)
Per appt notes pt already has appt 06/17/20 at 2:15 with Dr Diona Browner.

## 2020-06-17 NOTE — Telephone Encounter (Signed)
[place] Night - Client Nonclinical Telephone Record AccessNurse Client [place] Night - Client Client Site [place] Physician Alma Friendly - NP Contact Type Call Who Is Calling Patient / Member / Family / Caregiver Caller Name Marshall Phone Number [phone] Patient Name [name] Patient DOB [birth year] Call Type Message Only Information Provided Reason for Call Request to Schedule Office Appointment Initial Comment Caller says that Dr. Ronnald Ramp is requesting that Alma Friendly see the patient today. Patient request to speak to RN No Additional Comment Caller has been on the phone with [facility] already this morning and Dr. Ronnald Ramp is with [facility] and caller states he has to be seen today. Disp. Time Disposition Final User 06/17/2020 8:04:45 AM General Information Provided Yes [place], [place] Call Closed By: Madalyn Rob Transaction Date/Time: 06/17/2020 8:01:06 AM (ET)

## 2020-06-17 NOTE — Assessment & Plan Note (Signed)
Recent episode suspicious for GERD, especially given the greasy/fried meal that occurred just prior to symptoms, relieved with vomiting.  Discussed GERD and trigger foods.  He will update if this occurs again.  There doesn't seem to have been an allergic reaction or cardiac cause.

## 2020-06-17 NOTE — Assessment & Plan Note (Signed)
Noted, appears mild. Offered treatment with triamcinolone in orabase for which he declines. He believes the sore is better.  He will monitor and update if he changes his mind.

## 2020-06-17 NOTE — Progress Notes (Signed)
Subjective:    Patient ID: [name], male    DOB: 1933-06-16, 87 y.o.   MRN: [id number]  HPI  [name] is a very pleasant 87 y.o. male with a history of hypertension, B12 deficiency, dysphagia, allergic rhinitis, thrombocytopenia, who presents today to discuss oral sores.  Symptoms began one week ago. His daughter brought him a fried shrimp dinner with fries, he used some tarter sauce for which he got from [employer]. After completing his meal be began to experience chest tightness, nausea, chest pressure. He had to induce vomiting and felt much better after. The following day "I was so weak I could hardly walk" and hasn't done much over the last week.   Two evenings ago he developed a soreness and mucous in his throat, "rawness" to the roof of his mouth, cracked lips, spot on the tongue. Symptoms have improved some.   He denies wheezing, shortness of breath, tongue swelling, throat tightness/closure. He's not changed anything in his diet, he's had this shrimp dinner before.    Review of Systems  HENT: Negative for trouble swallowing.   Respiratory: Negative for shortness of breath, wheezing and stridor.   Gastrointestinal:       GERD, see HPI         Past Medical History:  Diagnosis Date  . Arthralgia of multiple joints    limited mobility w/  independant adl's  . BPH with obstruction/lower urinary tract symptoms   . Chronic idiopathic monocytosis    followed by dr Waymon Budge--  per lov note 06/ 2017 persistant unexplained  with normal bone barrow bx  . Coronary atherosclerosis of unspecified type of vessel, native or graft    cardiologist-  dr Stanford Breed -- per lov note 11-19-2014 ,  cardiac cath in 2000-- pLAD 60-70%,  small intermediate branch 70-80%,  mRCA 20%,  normal LV  . Degenerative arthritis of spine    cervical and lumar  . Diverticulosis of colon (without mention of hemorrhage)   . Dysuria    chronic  . Elevated PSA   . Feeling of incomplete  bladder emptying   . Hiatal hernia    moderate per ct 11/ 2017  . History of adenomatous polyp of colon    2008  . History of atrial fibrillation    remote hx episode  . History of pancreatitis    07-01-2013  and 10-10-2015  . History of prostatitis    2014  . Hx of colonic polyps   . Hyperlipidemia   . Hypertension   . Mouth sore    roof of mouth sore   . Nephrolithiasis   . Nocturia more than twice per night    severe  w/ leakage  . OSA (obstructive sleep apnea)    INTOLERANT CPAP  . Osteoarthritis   . Pernicious anemia    B12  Def.  . Peyronie disease   . Renal insufficiency   . Sepsis ([place]) 03/22/2017  . Thrombocytopenia, unspecified ([place]) hemotology/oncologist-  dr Waymon Budge   per dr Velta Addison note 06/ 2016  secondary to vitro clumping    Social History   Socioeconomic History  . Marital status: Widowed    Spouse name: Not on file  . Number of children: 2  . Years of education: Not on file  . Highest education level: Not on file  Occupational History  . Occupation: retired    Fish farm manager: RETIRED  Tobacco Use  . Smoking status: Former Smoker    Types: Pipe  Quit date: 01/18/1966    Years since quitting: 54.4  . Smokeless tobacco: Never Used  Vaping Use  . Vaping Use: Never used  Substance and Sexual Activity  . Alcohol use: No    Alcohol/week: 0.0 standard drinks  . Drug use: No  . Sexual activity: Not Currently  Other Topics Concern  . Not on file  Social History Narrative   DNR   Widowed   2 daughters leave near by   Social Determinants of Health   Financial Resource Strain: Not on file  Food Insecurity: Not on file  Transportation Needs: Not on file  Physical Activity: Not on file  Stress: Not on file  Social Connections: Not on file  Intimate Partner Violence: Not on file    Past Surgical History:  Procedure Laterality Date  . BONE MARROW BIOPSY  2011   normal  . CARDIAC CATHETERIZATION  2000   per dr Kathyrn Drown note --   dLAD 60-70%,  small intermediate branch 70-80%,  mRCA 20%,  normal LV  . CARDIOVASCULAR STRESS TEST  01-29-2013   dr Stanford Breed   normal nuclear study w/ no ischemia,  normal LV function and wall motion , ef 82%  . CATARACT EXTRACTION W/ INTRAOCULAR LENS  IMPLANT, BILATERAL  08/2015  . CHOLECYSTECTOMY  11/24/2010   Procedure: LAPAROSCOPIC CHOLECYSTECTOMY WITH INTRAOPERATIVE CHOLANGIOGRAM;  Surgeon: Earnstine Regal, MD;  Location: [facility];  Service: General;  Laterality: N/A;  c-arm  . CYSTOSCOPY W/ URETERAL STENT PLACEMENT Left 12/17/2015   Procedure: CYSTOSCOPY WITH RETROGRADE PYELOGRAM/URETERAL STENT PLACEMENT;  Surgeon: Ardis Hughs, MD;  Location: [facility];  Service: Urology;  Laterality: Left;  . CYSTOSCOPY WITH RETROGRADE PYELOGRAM, URETEROSCOPY AND STENT PLACEMENT Left 12/22/2015   Procedure: CYSTOSCOPY WITH LEFT RETROGRADE  URETEROSCOPY AND STENT PLACEMENT;  Surgeon: Carolan Clines, MD;  Location: [facility];  Service: Urology;  Laterality: Left;  . ERCP N/A 07/04/2013   Procedure: ENDOSCOPIC RETROGRADE CHOLANGIOPANCREATOGRAPHY (ERCP);  Surgeon: Gatha Mayer, MD;  Location: [facility];  Service: Endoscopy;  Laterality: N/A;  MAC if available  . ERCP N/A 10/12/2015   Procedure: ENDOSCOPIC RETROGRADE CHOLANGIOPANCREATOGRAPHY (ERCP);  Surgeon: [name], MD;  Location: [facility];  Service: Endoscopy;  Laterality: N/A;  . HOLMIUM LASER APPLICATION Left 60/07/3708   Procedure: HOLMIUM LASER APPLICATION;  Surgeon: Carolan Clines, MD;  Location: [facility];  Service: Urology;  Laterality: Left;  . INGUINAL HERNIA REPAIR Right 01/25/2002  . KNEE ARTHROSCOPY  x5  . SATURATION BIOPSY OF PROSTATE  05-29-2007  and 01-26-2008  . SHOULDER ARTHROSCOPY WITH OPEN ROTATOR CUFF REPAIR AND DISTAL CLAVICLE ACROMINECTOMY Right 11/19/2004  . TOTAL HIP ARTHROPLASTY Right 05/21/2009  . TOTAL KNEE ARTHROPLASTY Bilateral left 05-02-2003/  right  03-23-2010  . TRANSTHORACIC  ECHOCARDIOGRAM  12/23/2004   ef 60%, mild MV calcification without stenosis/  mild TR,  PASP 103mHg  . UVULOPALATOPHARYNGOPLASTY  1993    w/  T & A    Family History  Problem Relation Age of Onset  . Colon cancer Father 439 . Peripheral vascular disease Mother     Allergies  Allergen Reactions  . Penicillins Hives and Other (See Comments)    [place], passed out Tolerates cephalosporins  Has patient had a PCN reaction causing immediate rash, facial/tongue/throat swelling, SOB or lightheadedness with hypotension:  yes Has patient had a PCN reaction causing severe rash involving mucus membranes or skin necrosis: no Has patient had a PCN reaction that required hospitalization: no  Has patient had a PCN reaction occurring within the last 10 years: no If all of the above answers are "NO", then may proceed with Cephalosporin use.     Current Outpatient Medications on File Prior to Visit  Medication Sig Dispense Refill  . acetaminophen (TYLENOL) 325 MG tablet Take 650 mg by mouth every 6 (six) hours as needed for mild pain.    Marland Kitchen albuterol (VENTOLIN HFA) 108 (90 Base) MCG/ACT inhaler Inhale into the lungs.    . Artificial Tear Solution (TEARS NATURALE OP) Place 1-2 drops into both eyes at bedtime as needed (dry eyes).    . benzonatate (TESSALON) 200 MG capsule TAKE 1 CAPSULE (200 MG TOTAL) BY MOUTH 3 (THREE) TIMES DAILY AS NEEDED FOR COUGH. 15 capsule 0  . fluticasone (FLONASE) 50 MCG/ACT nasal spray Place 1 spray into both nostrils as needed for allergies or rhinitis.    . Loratadine (CLARITIN PO) Take 1 tablet by mouth daily.     . Multiple Vitamin (MULTIVITAMIN) tablet Take 1 tablet by mouth daily.    . tamsulosin (FLOMAX) 0.4 MG CAPS capsule Take 0.4 mg by mouth in the morning and at bedtime.    . triamcinolone (KENALOG) 0.1 % paste Apply to the tongue twice daily for 1-2 weeks. 5 g 0   No current facility-administered medications on file prior to visit.    BP 124/62   Pulse 72    Temp 98.6 F (37 C) (Temporal)   Ht 5' (1.524 m)   Wt 135 lb (61.2 kg)   SpO2 98%   BMI 26.37 kg/m  Objective:   Physical Exam HENT:     Mouth/Throat:     Comments: Small ulcer noted to roof of mouth, no surrounding erythema, no drainage. No abnormality noted to tongue. Cracked skin noted to bottom lip without swelling. Cardiovascular:     Rate and Rhythm: Normal rate and regular rhythm.  Pulmonary:     Effort: Pulmonary effort is normal.     Breath sounds: Normal breath sounds. No wheezing or rales.  Musculoskeletal:     Cervical back: Neck supple.  Skin:    General: Skin is warm and dry.  Neurological:     Mental Status: He is alert and oriented to person, place, and time.           Assessment & Plan:      This visit occurred during the SARS-CoV-2 public health emergency.  Safety protocols were in place, including screening questions prior to the visit, additional usage of staff PPE, and extensive cleaning of exam room while observing appropriate contact time as indicated for disinfecting solutions.

## 2020-06-17 NOTE — Patient Instructions (Signed)
Notify me if the sore in your mouth doesn't continue to improve.   Use some chapstick to your lips. Let me know if this isn't working.  Avoid heavy/greasy/fried meals, this can induce heartburn.  It was a pleasure to see you today!

## 2020-08-01 ENCOUNTER — Telehealth: Payer: Self-pay

## 2020-08-01 DIAGNOSIS — E538 Deficiency of other specified B group vitamins: Secondary | ICD-10-CM

## 2020-08-01 NOTE — Telephone Encounter (Signed)
Patient called to check on his B12 appt and found out that he did not have any scheduled. Reviewed notes and found out that patient had his last B12 injection on 05/06/20 and he did not realize it has been that long and stated he could tell that he missed doses. Patient is scheduled for 08/05/20.   Does patient need to have labs done before the injection or to proceed ? Please let patient know

## 2020-08-05 ENCOUNTER — Other Ambulatory Visit: Payer: Self-pay

## 2020-08-05 ENCOUNTER — Other Ambulatory Visit (INDEPENDENT_AMBULATORY_CARE_PROVIDER_SITE_OTHER): Payer: Medicare HMO

## 2020-08-05 ENCOUNTER — Ambulatory Visit (INDEPENDENT_AMBULATORY_CARE_PROVIDER_SITE_OTHER): Payer: Medicare HMO

## 2020-08-05 DIAGNOSIS — E538 Deficiency of other specified B group vitamins: Secondary | ICD-10-CM

## 2020-08-05 LAB — VITAMIN B12: Vitamin B-12: 346 pg/mL (ref 211–911)

## 2020-08-05 MED ORDER — CYANOCOBALAMIN 1000 MCG/ML IJ SOLN
1000.0000 ug | Freq: Once | INTRAMUSCULAR | Status: AC
  Administered 2020-08-05: 1000 ug via INTRAMUSCULAR

## 2020-08-05 NOTE — Telephone Encounter (Signed)
Left message to return call to our office.  

## 2020-08-05 NOTE — Addendum Note (Signed)
Addended by: Pleas Koch on: 08/05/2020 07:30 AM   Modules accepted: Orders

## 2020-08-05 NOTE — Progress Notes (Signed)
Per orders of Allie Bossier, NP, monthly injection of B12 1000 mcg/ml given by Pilar Grammes, CMA in Left Deltoid after he had his B12 labs drawn. Patient tolerated injection well.

## 2020-08-05 NOTE — Telephone Encounter (Signed)
Thank you!  Yes, would like for patient to have updated B12 lab. Can we have him have his lab drawn prior to his injection? I will order his lab.

## 2020-08-07 NOTE — Telephone Encounter (Signed)
Had lab and next b-12 app made per lab note.

## 2020-08-12 ENCOUNTER — Other Ambulatory Visit: Payer: Self-pay

## 2020-08-12 ENCOUNTER — Ambulatory Visit (INDEPENDENT_AMBULATORY_CARE_PROVIDER_SITE_OTHER): Payer: Medicare HMO | Admitting: Primary Care

## 2020-08-12 ENCOUNTER — Encounter: Payer: Self-pay | Admitting: Primary Care

## 2020-08-12 VITALS — BP 96/56 | HR 84 | Temp 98.0°F | Ht 60.0 in | Wt 138.0 lb

## 2020-08-12 DIAGNOSIS — M25519 Pain in unspecified shoulder: Secondary | ICD-10-CM | POA: Insufficient documentation

## 2020-08-12 DIAGNOSIS — D229 Melanocytic nevi, unspecified: Secondary | ICD-10-CM | POA: Insufficient documentation

## 2020-08-12 DIAGNOSIS — G8929 Other chronic pain: Secondary | ICD-10-CM | POA: Diagnosis not present

## 2020-08-12 DIAGNOSIS — F331 Major depressive disorder, recurrent, moderate: Secondary | ICD-10-CM | POA: Diagnosis not present

## 2020-08-12 DIAGNOSIS — M25512 Pain in left shoulder: Secondary | ICD-10-CM | POA: Diagnosis not present

## 2020-08-12 DIAGNOSIS — M25511 Pain in right shoulder: Secondary | ICD-10-CM

## 2020-08-12 DIAGNOSIS — F419 Anxiety disorder, unspecified: Secondary | ICD-10-CM

## 2020-08-12 DIAGNOSIS — Z8616 Personal history of COVID-19: Secondary | ICD-10-CM

## 2020-08-12 DIAGNOSIS — R69 Illness, unspecified: Secondary | ICD-10-CM | POA: Diagnosis not present

## 2020-08-12 MED ORDER — SERTRALINE HCL 25 MG PO TABS: 25.0000 mg | ORAL_TABLET | Freq: Every day | ORAL | 0 refills | Status: DC

## 2020-08-12 NOTE — Assessment & Plan Note (Signed)
Multiple to posterior trunk, no obvious suspicious appearing nevi. Offered dermatology referral, he kindly declines.

## 2020-08-12 NOTE — Assessment & Plan Note (Signed)
Chronic for years, overall stable exam today.  Recommended he set up an appointment with Sports medicine as he may benefit from cortisone injections.

## 2020-08-12 NOTE — Patient Instructions (Signed)
Start sertraline 25 mg once daily for anxiety and depression. This may take 4-6 weeks to reach max effect.  Please schedule a follow up visit for 6 weeks for follow up of anxiety/depression.  Set up a visit with Dr. Lorelei Pont for your shoulders.   It was a pleasure to see you today!

## 2020-08-12 NOTE — Progress Notes (Signed)
Subjective:    Patient ID: [name], male    DOB: [birth year], 87 y.o.   MRN: [id number]  HPI  [name] is a very pleasant 87 y.o. male with a history of hypertension, sleep apnea, canker sores, BPH, pernicious anemia, chronic fatigue who presents today to discuss multiple issues.  History of Covid-19 infection in January 2022, continues to have altered taste with food since infection. He is eating TV dinners and sandwiches mostly. He drinks mostly flavored beverages and can taste the flavors.   He has two nevi to the back for which he noticed 2-3 weeks ago. He would like for [place] to take a look today.   He would also like to discuss chronic joint aches. Located mostly to shoulders, wrists, elbows, knees, hips. He believes that he may have two torn rotator cuffs to his shoulders due to decrease in ROM with pain to both shoulders, chronic for years. History of prior right shoulder years ago. He can do most of his ADL's now. He's been using an herbal cream OTC which helps. He's tried Voltaren Gel without much improvement.   He is ready to start antidepressant and antianxiety medication for chronic symptoms of anxiety, difficulty making decisions, feeling down. He was once treated with sertraline 25 mg but he never took the medication as he was afraid.   Review of Systems  Constitutional:  Positive for fatigue.  HENT:         Altered taste since January 2022.  Musculoskeletal:  Positive for arthralgias.        Past Medical History:  Diagnosis Date   Arthralgia of multiple joints    limited mobility w/  independant adl's   BPH with obstruction/lower urinary tract symptoms    Chronic idiopathic monocytosis    followed by dr Waymon Budge--  per lov note 06/ 2017 persistant unexplained  with normal bone barrow bx   Coronary atherosclerosis of unspecified type of vessel, native or graft    cardiologist-  dr Stanford Breed -- per lov note 11-19-2014 ,  cardiac cath in 2000-- pLAD  60-70%,  small intermediate branch 70-80%,  mRCA 20%,  normal LV   Degenerative arthritis of spine    cervical and lumar   Diverticulosis of colon (without mention of hemorrhage)    Dysuria    chronic   Elevated PSA    Feeling of incomplete bladder emptying    Hiatal hernia    moderate per ct 11/ 2017   History of adenomatous polyp of colon    2008   History of atrial fibrillation    remote hx episode   History of pancreatitis    07-01-2013  and 10-10-2015   History of prostatitis    2014   Hx of colonic polyps    Hyperlipidemia    Hypertension    Mouth sore    roof of mouth sore    Nephrolithiasis    Nocturia more than twice per night    severe  w/ leakage   OSA (obstructive sleep apnea)    INTOLERANT CPAP   Osteoarthritis    Pernicious anemia    B12  Def.   Peyronie disease    Renal insufficiency    Sepsis ([place]) 03/22/2017   Thrombocytopenia, unspecified ([place]) hemotology/oncologist-  dr Waymon Budge   per dr Velta Addison note 06/ 2016  secondary to vitro clumping    Social History   Socioeconomic History   Marital status: Widowed    Spouse name: Not on  file   Number of children: 2   Years of education: Not on file   Highest education level: Not on file  Occupational History   Occupation: retired    Fish farm manager: RETIRED  Tobacco Use   Smoking status: Former    Types: Pipe    Quit date: 01/18/1966    Years since quitting: 69.6   Smokeless tobacco: Never  Vaping Use   Vaping Use: Never used  Substance and Sexual Activity   Alcohol use: No    Alcohol/week: 0.0 standard drinks   Drug use: No   Sexual activity: Not Currently  Other Topics Concern   Not on file  Social History Narrative   DNR   Widowed   2 daughters leave near by   Social Determinants of Health   Financial Resource Strain: Not on file  Food Insecurity: Not on file  Transportation Needs: Not on file  Physical Activity: Not on file  Stress: Not on file  Social Connections: Not on file   Intimate Partner Violence: Not on file    Past Surgical History:  Procedure Laterality Date   BONE MARROW BIOPSY  2011   normal   CARDIAC CATHETERIZATION  2000   per dr Kathyrn Drown note --  dLAD 60-70%,  small intermediate branch 70-80%,  mRCA 20%,  normal LV   CARDIOVASCULAR STRESS TEST  01-29-2013   dr Stanford Breed   normal nuclear study w/ no ischemia,  normal LV function and wall motion , ef 82%   CATARACT EXTRACTION W/ INTRAOCULAR LENS  IMPLANT, BILATERAL  08/2015   CHOLECYSTECTOMY  11/24/2010   Procedure: LAPAROSCOPIC CHOLECYSTECTOMY WITH INTRAOPERATIVE CHOLANGIOGRAM;  Surgeon: Earnstine Regal, MD;  Location: [facility];  Service: General;  Laterality: N/A;  c-arm   CYSTOSCOPY W/ URETERAL STENT PLACEMENT Left 12/17/2015   Procedure: CYSTOSCOPY WITH RETROGRADE PYELOGRAM/URETERAL STENT PLACEMENT;  Surgeon: Ardis Hughs, MD;  Location: [facility];  Service: Urology;  Laterality: Left;   CYSTOSCOPY WITH RETROGRADE PYELOGRAM, URETEROSCOPY AND STENT PLACEMENT Left 12/22/2015   Procedure: CYSTOSCOPY WITH LEFT RETROGRADE  URETEROSCOPY AND STENT PLACEMENT;  Surgeon: Carolan Clines, MD;  Location: [facility];  Service: Urology;  Laterality: Left;   ERCP N/A 07/04/2013   Procedure: ENDOSCOPIC RETROGRADE CHOLANGIOPANCREATOGRAPHY (ERCP);  Surgeon: Gatha Mayer, MD;  Location: [facility];  Service: Endoscopy;  Laterality: N/A;  MAC if available   ERCP N/A 10/12/2015   Procedure: ENDOSCOPIC RETROGRADE CHOLANGIOPANCREATOGRAPHY (ERCP);  Surgeon: [name], MD;  Location: [facility];  Service: Endoscopy;  Laterality: N/A;   HOLMIUM LASER APPLICATION Left 39/0/3009   Procedure: HOLMIUM LASER APPLICATION;  Surgeon: Carolan Clines, MD;  Location: [facility];  Service: Urology;  Laterality: Left;   INGUINAL HERNIA REPAIR Right 01/25/2002   KNEE ARTHROSCOPY  x5   SATURATION BIOPSY OF PROSTATE  05-29-2007  and 01-26-2008   SHOULDER ARTHROSCOPY WITH OPEN ROTATOR CUFF  REPAIR AND DISTAL CLAVICLE ACROMINECTOMY Right 11/19/2004   TOTAL HIP ARTHROPLASTY Right 05/21/2009   TOTAL KNEE ARTHROPLASTY Bilateral left 05-02-2003/  right  03-23-2010   TRANSTHORACIC ECHOCARDIOGRAM  12/23/2004   ef 60%, mild MV calcification without stenosis/  mild TR,  PASP 67mHg   UVULOPALATOPHARYNGOPLASTY  1993    w/  T & A    Family History  Problem Relation Age of Onset   Colon cancer Father 454  Peripheral vascular disease Mother     Allergies  Allergen Reactions   Penicillins Hives and Other (See Comments)  [place], passed out Tolerates cephalosporins  Has patient had a PCN reaction causing immediate rash, facial/tongue/throat swelling, SOB or lightheadedness with hypotension:  yes Has patient had a PCN reaction causing severe rash involving mucus membranes or skin necrosis: no Has patient had a PCN reaction that required hospitalization: no Has patient had a PCN reaction occurring within the last 10 years: no If all of the above answers are "NO", then may proceed with Cephalosporin use.     Current Outpatient Medications on File Prior to Visit  Medication Sig Dispense Refill   acetaminophen (TYLENOL) 325 MG tablet Take 650 mg by mouth every 6 (six) hours as needed for mild pain.     albuterol (VENTOLIN HFA) 108 (90 Base) MCG/ACT inhaler Inhale into the lungs.     Artificial Tear Solution (TEARS NATURALE OP) Place 1-2 drops into both eyes at bedtime as needed (dry eyes).     Loratadine (CLARITIN PO) Take 1 tablet by mouth daily.      Multiple Vitamin (MULTIVITAMIN) tablet Take 1 tablet by mouth daily.     fluticasone (FLONASE) 50 MCG/ACT nasal spray Place 1 spray into both nostrils as needed for allergies or rhinitis. (Patient not taking: Reported on 08/12/2020)     tamsulosin (FLOMAX) 0.4 MG CAPS capsule Take 0.4 mg by mouth in the morning and at bedtime. (Patient not taking: Reported on 08/12/2020)     No current facility-administered medications on file prior to  visit.    BP (!) 96/56 (BP Location: Left Arm, Patient Position: Sitting, Cuff Size: Normal)   Pulse 84   Temp 98 F (36.7 C)   Ht 5' (1.524 m)   Wt 138 lb (62.6 kg)   SpO2 100%   BMI 26.95 kg/m  Objective:   Physical Exam Cardiovascular:     Rate and Rhythm: Normal rate.  Pulmonary:     Effort: Pulmonary effort is normal.  Musculoskeletal:     Comments: Decrease in ROM with pain to bilateral shoulders with abduction. Can increase arms up to ears.   Skin:    Comments: Two <0.25 cm rounded nevi to right thoracic back. One nevi is dark brown, one nevi is more flesh colored.   Neurological:     Mental Status: He is alert.          Assessment & Plan:      This visit occurred during the SARS-CoV-2 public health emergency.  Safety protocols were in place, including screening questions prior to the visit, additional usage of staff PPE, and extensive cleaning of exam room while observing appropriate contact time as indicated for disinfecting solutions.

## 2020-08-12 NOTE — Assessment & Plan Note (Signed)
Chronic altered taste since infection in January 2022. Encouraged to try new foods, healthier foods. He is eating daily. Encouraged water intake.

## 2020-08-12 NOTE — Assessment & Plan Note (Signed)
He is ready to start sertraline 25 mg for chronic symptoms. New Rx sent to pharmacy.  We discussed possible side effects of headache, GI upset, drowsiness, and SI/HI. If thoughts of SI/HI develop, we discussed to present to the emergency immediately. Patient verbalized understanding.   Follow up in 6 weeks for re-evaluation.

## 2020-08-14 ENCOUNTER — Telehealth: Payer: Self-pay | Admitting: Primary Care

## 2020-08-14 NOTE — Telephone Encounter (Signed)
Please triage.  Scheduled to see Dr. Lorelei Pont on Monday.

## 2020-08-14 NOTE — Telephone Encounter (Signed)
Unable to reach pt by phone and spoke with [name] (DPR signed); [name] said she was unaware of pt having any problems at this time; I offered to continue to contact pt and while I was on phone with [name] the pt called back and [name] said she wanted me to continue with her because that was her father and if something is wrong with him she wants to know. Denisha at front desk phones advised pt that his daughter would be calling him back. [name] lives near her father and has not been aware of any body aches, cough, S/T, H/A or loss of taste. [name] said that her father could not do a video or virtual appt because he has hearing aides and cannot hear well on phone and does not know how to do mychart. [name]  wanted to know why we cannot see pt in office and I explained the protocol for possible covid symptoms and [name] voiced understanding. [name] said that she is going to talk with pt and take him to an [facility] in [place] on 08/15/20. She said that pt usually goes to an UC in [place] but does not know name of that UC and asked how long do non affiliated [facility] keep records. I advised [name] I do not know how long [facility], [facility] or [facility] keep records. [name] said she would cb with update on how pt is doing and what the UC visit outcome will be. [name] said that her father is still working and does not wash his hands as he should or wear a mask. [name] said he went to a funeral home last night and is sure pt did not wear a mask. [name] said due to type visit to cancel 08/18/20 visit at this time. Sending note to Gentry Fitz NP as PCP and Dr Lorelei Pont and Butch Penny CMA.

## 2020-08-14 NOTE — Telephone Encounter (Signed)
Hey think this should have went to Dr. Lorelei Pont

## 2020-08-14 NOTE — Telephone Encounter (Addendum)
Called pt to remind him of his appt. Pt stated that within the last ten days he has been experiencing muscle pain from head to toe, cough, sore throat, headache, loss of taste. I told pt that the provider may want to do a virtual visit and that he will be notified if so. Pt also informed me that he has not taken a covid test.

## 2020-08-14 NOTE — Telephone Encounter (Signed)
Noted.  I just saw patient in the office 2 days ago and he endorsed chronic joint pain which has been present for years, otherwise no other symptoms.

## 2020-08-15 DIAGNOSIS — M1990 Unspecified osteoarthritis, unspecified site: Secondary | ICD-10-CM | POA: Diagnosis not present

## 2020-08-15 DIAGNOSIS — Z20822 Contact with and (suspected) exposure to covid-19: Secondary | ICD-10-CM | POA: Diagnosis not present

## 2020-08-15 NOTE — Telephone Encounter (Signed)
Noted.  I have not seen the patient in 10 years.

## 2020-08-18 ENCOUNTER — Ambulatory Visit: Payer: Medicare HMO | Admitting: Family Medicine

## 2020-09-11 ENCOUNTER — Ambulatory Visit: Payer: Medicare HMO

## 2020-09-16 ENCOUNTER — Other Ambulatory Visit: Payer: Self-pay

## 2020-09-16 ENCOUNTER — Ambulatory Visit (INDEPENDENT_AMBULATORY_CARE_PROVIDER_SITE_OTHER): Payer: Medicare HMO

## 2020-09-16 DIAGNOSIS — E538 Deficiency of other specified B group vitamins: Secondary | ICD-10-CM

## 2020-09-16 MED ORDER — CYANOCOBALAMIN 1000 MCG/ML IJ SOLN
1000.0000 ug | Freq: Once | INTRAMUSCULAR | Status: AC
  Administered 2020-09-16: 1000 ug via INTRAMUSCULAR

## 2020-09-16 NOTE — Progress Notes (Signed)
Per orders of Alma Friendly NP, injection of B-12 (Cyanocobalamin) was given by Ophelia Shoulder. Patient tolerated injection well.

## 2020-09-23 ENCOUNTER — Ambulatory Visit: Payer: Medicare HMO | Admitting: Primary Care

## 2020-09-30 DIAGNOSIS — R3915 Urgency of urination: Secondary | ICD-10-CM | POA: Diagnosis not present

## 2020-09-30 DIAGNOSIS — N401 Enlarged prostate with lower urinary tract symptoms: Secondary | ICD-10-CM | POA: Diagnosis not present

## 2020-09-30 DIAGNOSIS — R351 Nocturia: Secondary | ICD-10-CM | POA: Diagnosis not present

## 2020-09-30 DIAGNOSIS — R3912 Poor urinary stream: Secondary | ICD-10-CM | POA: Diagnosis not present

## 2020-10-02 ENCOUNTER — Telehealth: Payer: Self-pay | Admitting: Primary Care

## 2020-10-02 NOTE — Telephone Encounter (Signed)
Called patient l/m to call office we will need to make appointment or go to [facility]

## 2020-10-02 NOTE — Telephone Encounter (Signed)
Pt called stating that he damaged his rotary cuffs and would like to have a referral to a orthopedic surgeon

## 2020-10-03 NOTE — Telephone Encounter (Signed)
Patient called in wanted to make an appt for seeing Anda Kraft for referral. And the dr name he wants to peter daldorf. And wants Anda Kraft to call him.

## 2020-10-07 ENCOUNTER — Telehealth: Payer: Self-pay

## 2020-10-07 ENCOUNTER — Telehealth: Payer: Self-pay | Admitting: Primary Care

## 2020-10-07 ENCOUNTER — Ambulatory Visit: Payer: Medicare HMO | Admitting: Primary Care

## 2020-10-07 NOTE — Telephone Encounter (Signed)
Have spoke to daughter. Message sent to [facility] no further action at this time.

## 2020-10-07 NOTE — Telephone Encounter (Signed)
Pt daughter called stating that someone called her and she thought it was Allie Bossier, she was returning the call and would like a call back.

## 2020-10-07 NOTE — Telephone Encounter (Signed)
Patient has an appointment today 10/07/20 to discuss this

## 2020-10-07 NOTE — Telephone Encounter (Signed)
Spoke to daughter she has made appointment with [facility]. The appointment is for 9/26.

## 2020-10-08 NOTE — Telephone Encounter (Signed)
Yes that was the reason for visit and yes I will take him off to make sure not fee charged.

## 2020-10-08 NOTE — Telephone Encounter (Signed)
Noted, was this the reason for his scheduled visit this week? Make sure we take him off the schedule.

## 2020-10-13 DIAGNOSIS — M25512 Pain in left shoulder: Secondary | ICD-10-CM | POA: Diagnosis not present

## 2020-10-13 DIAGNOSIS — M25511 Pain in right shoulder: Secondary | ICD-10-CM | POA: Diagnosis not present

## 2020-10-24 DIAGNOSIS — M19011 Primary osteoarthritis, right shoulder: Secondary | ICD-10-CM | POA: Diagnosis not present

## 2020-10-24 DIAGNOSIS — M19012 Primary osteoarthritis, left shoulder: Secondary | ICD-10-CM | POA: Diagnosis not present

## 2020-10-24 DIAGNOSIS — M6281 Muscle weakness (generalized): Secondary | ICD-10-CM | POA: Diagnosis not present

## 2020-10-27 DIAGNOSIS — M19012 Primary osteoarthritis, left shoulder: Secondary | ICD-10-CM | POA: Diagnosis not present

## 2020-10-27 DIAGNOSIS — M19011 Primary osteoarthritis, right shoulder: Secondary | ICD-10-CM | POA: Diagnosis not present

## 2020-10-27 DIAGNOSIS — M6281 Muscle weakness (generalized): Secondary | ICD-10-CM | POA: Diagnosis not present

## 2020-10-28 DIAGNOSIS — R059 Cough, unspecified: Secondary | ICD-10-CM | POA: Diagnosis not present

## 2020-10-28 DIAGNOSIS — F419 Anxiety disorder, unspecified: Secondary | ICD-10-CM | POA: Diagnosis not present

## 2020-10-28 DIAGNOSIS — E785 Hyperlipidemia, unspecified: Secondary | ICD-10-CM | POA: Diagnosis not present

## 2020-10-28 DIAGNOSIS — N400 Enlarged prostate without lower urinary tract symptoms: Secondary | ICD-10-CM | POA: Diagnosis not present

## 2020-10-28 DIAGNOSIS — J45909 Unspecified asthma, uncomplicated: Secondary | ICD-10-CM | POA: Diagnosis not present

## 2020-10-28 DIAGNOSIS — M1990 Unspecified osteoarthritis, unspecified site: Secondary | ICD-10-CM | POA: Diagnosis not present

## 2020-10-28 DIAGNOSIS — R69 Illness, unspecified: Secondary | ICD-10-CM | POA: Diagnosis not present

## 2020-10-28 DIAGNOSIS — G8929 Other chronic pain: Secondary | ICD-10-CM | POA: Diagnosis not present

## 2020-10-28 DIAGNOSIS — K219 Gastro-esophageal reflux disease without esophagitis: Secondary | ICD-10-CM | POA: Diagnosis not present

## 2020-10-28 DIAGNOSIS — F329 Major depressive disorder, single episode, unspecified: Secondary | ICD-10-CM | POA: Diagnosis not present

## 2020-10-28 DIAGNOSIS — G4730 Sleep apnea, unspecified: Secondary | ICD-10-CM | POA: Diagnosis not present

## 2020-10-28 DIAGNOSIS — Z604 Social exclusion and rejection: Secondary | ICD-10-CM | POA: Diagnosis not present

## 2020-10-28 DIAGNOSIS — M545 Low back pain, unspecified: Secondary | ICD-10-CM | POA: Diagnosis not present

## 2020-10-29 DIAGNOSIS — M19011 Primary osteoarthritis, right shoulder: Secondary | ICD-10-CM | POA: Diagnosis not present

## 2020-10-29 DIAGNOSIS — M6281 Muscle weakness (generalized): Secondary | ICD-10-CM | POA: Diagnosis not present

## 2020-10-29 DIAGNOSIS — M19012 Primary osteoarthritis, left shoulder: Secondary | ICD-10-CM | POA: Diagnosis not present

## 2020-11-04 DIAGNOSIS — M19012 Primary osteoarthritis, left shoulder: Secondary | ICD-10-CM | POA: Diagnosis not present

## 2020-11-04 DIAGNOSIS — M19011 Primary osteoarthritis, right shoulder: Secondary | ICD-10-CM | POA: Diagnosis not present

## 2020-11-04 DIAGNOSIS — M6281 Muscle weakness (generalized): Secondary | ICD-10-CM | POA: Diagnosis not present

## 2020-11-07 ENCOUNTER — Other Ambulatory Visit: Payer: Self-pay | Admitting: Primary Care

## 2020-11-07 DIAGNOSIS — M19011 Primary osteoarthritis, right shoulder: Secondary | ICD-10-CM | POA: Diagnosis not present

## 2020-11-07 DIAGNOSIS — F331 Major depressive disorder, recurrent, moderate: Secondary | ICD-10-CM

## 2020-11-07 DIAGNOSIS — M6281 Muscle weakness (generalized): Secondary | ICD-10-CM | POA: Diagnosis not present

## 2020-11-07 DIAGNOSIS — M19012 Primary osteoarthritis, left shoulder: Secondary | ICD-10-CM | POA: Diagnosis not present

## 2020-11-07 DIAGNOSIS — F419 Anxiety disorder, unspecified: Secondary | ICD-10-CM

## 2020-11-10 DIAGNOSIS — M19011 Primary osteoarthritis, right shoulder: Secondary | ICD-10-CM | POA: Diagnosis not present

## 2020-11-10 DIAGNOSIS — M6281 Muscle weakness (generalized): Secondary | ICD-10-CM | POA: Diagnosis not present

## 2020-11-10 DIAGNOSIS — M19012 Primary osteoarthritis, left shoulder: Secondary | ICD-10-CM | POA: Diagnosis not present

## 2020-11-12 DIAGNOSIS — M19011 Primary osteoarthritis, right shoulder: Secondary | ICD-10-CM | POA: Diagnosis not present

## 2020-11-12 DIAGNOSIS — M6281 Muscle weakness (generalized): Secondary | ICD-10-CM | POA: Diagnosis not present

## 2020-11-12 DIAGNOSIS — M19211 Secondary osteoarthritis, right shoulder: Secondary | ICD-10-CM | POA: Diagnosis not present

## 2020-11-12 DIAGNOSIS — M19012 Primary osteoarthritis, left shoulder: Secondary | ICD-10-CM | POA: Diagnosis not present

## 2020-11-24 ENCOUNTER — Telehealth: Payer: Self-pay | Admitting: Primary Care

## 2020-11-24 NOTE — Telephone Encounter (Signed)
LVM for pt to rtn my call to schedule AWV with [facility]. Please schedule AWV with pt calls the office .

## 2021-01-07 ENCOUNTER — Ambulatory Visit (INDEPENDENT_AMBULATORY_CARE_PROVIDER_SITE_OTHER): Payer: Medicare HMO

## 2021-01-07 ENCOUNTER — Other Ambulatory Visit: Payer: Self-pay

## 2021-01-07 DIAGNOSIS — E538 Deficiency of other specified B group vitamins: Secondary | ICD-10-CM

## 2021-01-07 MED ORDER — CYANOCOBALAMIN 1000 MCG/ML IJ SOLN
1000.0000 ug | Freq: Once | INTRAMUSCULAR | Status: AC
  Administered 2021-01-07: 16:00:00 1000 ug via INTRAMUSCULAR

## 2021-01-07 NOTE — Progress Notes (Signed)
Per orders of Dr. Bosie Clos in leu of Alma Friendly NP, injection of B12 was given by Ophelia Shoulder, CMA. Patient tolerated injection well.

## 2021-02-10 ENCOUNTER — Other Ambulatory Visit: Payer: Self-pay

## 2021-02-10 ENCOUNTER — Ambulatory Visit (INDEPENDENT_AMBULATORY_CARE_PROVIDER_SITE_OTHER): Payer: Medicare HMO

## 2021-02-10 DIAGNOSIS — E538 Deficiency of other specified B group vitamins: Secondary | ICD-10-CM

## 2021-02-10 MED ORDER — CYANOCOBALAMIN 1000 MCG/ML IJ SOLN
1000.0000 ug | Freq: Once | INTRAMUSCULAR | Status: AC
  Administered 2021-02-10: 15:00:00 1000 ug via INTRAMUSCULAR

## 2021-02-10 NOTE — Progress Notes (Signed)
Per orders of Allie Bossier, monthly injection of b12 given by Loreen Freud. Patient tolerated injection well.

## 2021-02-14 ENCOUNTER — Other Ambulatory Visit: Payer: Self-pay

## 2021-02-14 ENCOUNTER — Emergency Department (HOSPITAL_COMMUNITY): Payer: Medicare HMO

## 2021-02-14 ENCOUNTER — Encounter (HOSPITAL_COMMUNITY): Payer: Self-pay | Admitting: *Deleted

## 2021-02-14 ENCOUNTER — Emergency Department (HOSPITAL_COMMUNITY)
Admission: EM | Admit: 2021-02-14 | Discharge: 2021-02-14 | Disposition: A | Discharge status: Home / Self Care | Payer: Medicare HMO | Attending: Emergency Medicine | Admitting: Emergency Medicine

## 2021-02-14 DIAGNOSIS — Z96641 Presence of right artificial hip joint: Secondary | ICD-10-CM | POA: Insufficient documentation

## 2021-02-14 DIAGNOSIS — I10 Essential (primary) hypertension: Secondary | ICD-10-CM | POA: Insufficient documentation

## 2021-02-14 DIAGNOSIS — S01112A Laceration without foreign body of left eyelid and periocular area, initial encounter: Secondary | ICD-10-CM | POA: Insufficient documentation

## 2021-02-14 DIAGNOSIS — Z23 Encounter for immunization: Secondary | ICD-10-CM | POA: Diagnosis not present

## 2021-02-14 DIAGNOSIS — W19XXXA Unspecified fall, initial encounter: Secondary | ICD-10-CM

## 2021-02-14 DIAGNOSIS — Z96653 Presence of artificial knee joint, bilateral: Secondary | ICD-10-CM | POA: Diagnosis not present

## 2021-02-14 DIAGNOSIS — S61511A Laceration without foreign body of right wrist, initial encounter: Secondary | ICD-10-CM | POA: Insufficient documentation

## 2021-02-14 DIAGNOSIS — S6992XA Unspecified injury of left wrist, hand and finger(s), initial encounter: Secondary | ICD-10-CM | POA: Diagnosis not present

## 2021-02-14 DIAGNOSIS — T148XXA Other injury of unspecified body region, initial encounter: Secondary | ICD-10-CM

## 2021-02-14 DIAGNOSIS — I2510 Atherosclerotic heart disease of native coronary artery without angina pectoris: Secondary | ICD-10-CM | POA: Diagnosis not present

## 2021-02-14 DIAGNOSIS — Z87891 Personal history of nicotine dependence: Secondary | ICD-10-CM | POA: Insufficient documentation

## 2021-02-14 DIAGNOSIS — S61411A Laceration without foreign body of right hand, initial encounter: Secondary | ICD-10-CM | POA: Diagnosis not present

## 2021-02-14 DIAGNOSIS — M189 Osteoarthritis of first carpometacarpal joint, unspecified: Secondary | ICD-10-CM | POA: Diagnosis not present

## 2021-02-14 DIAGNOSIS — I6523 Occlusion and stenosis of bilateral carotid arteries: Secondary | ICD-10-CM | POA: Diagnosis not present

## 2021-02-14 DIAGNOSIS — S0101XA Laceration without foreign body of scalp, initial encounter: Secondary | ICD-10-CM | POA: Diagnosis not present

## 2021-02-14 DIAGNOSIS — M1812 Unilateral primary osteoarthritis of first carpometacarpal joint, left hand: Secondary | ICD-10-CM | POA: Diagnosis not present

## 2021-02-14 DIAGNOSIS — S0990XA Unspecified injury of head, initial encounter: Secondary | ICD-10-CM | POA: Diagnosis not present

## 2021-02-14 DIAGNOSIS — R6889 Other general symptoms and signs: Secondary | ICD-10-CM | POA: Diagnosis not present

## 2021-02-14 DIAGNOSIS — S61512A Laceration without foreign body of left wrist, initial encounter: Secondary | ICD-10-CM | POA: Insufficient documentation

## 2021-02-14 DIAGNOSIS — S199XXA Unspecified injury of neck, initial encounter: Secondary | ICD-10-CM | POA: Diagnosis not present

## 2021-02-14 DIAGNOSIS — I700 Atherosclerosis of aorta: Secondary | ICD-10-CM | POA: Diagnosis not present

## 2021-02-14 DIAGNOSIS — I4891 Unspecified atrial fibrillation: Secondary | ICD-10-CM | POA: Diagnosis not present

## 2021-02-14 DIAGNOSIS — Z743 Need for continuous supervision: Secondary | ICD-10-CM | POA: Diagnosis not present

## 2021-02-14 DIAGNOSIS — W010XXA Fall on same level from slipping, tripping and stumbling without subsequent striking against object, initial encounter: Secondary | ICD-10-CM | POA: Diagnosis not present

## 2021-02-14 DIAGNOSIS — M19032 Primary osteoarthritis, left wrist: Secondary | ICD-10-CM | POA: Diagnosis not present

## 2021-02-14 DIAGNOSIS — S6991XA Unspecified injury of right wrist, hand and finger(s), initial encounter: Secondary | ICD-10-CM | POA: Diagnosis not present

## 2021-02-14 DIAGNOSIS — S00202A Unspecified superficial injury of left eyelid and periocular area, initial encounter: Secondary | ICD-10-CM | POA: Diagnosis not present

## 2021-02-14 HISTORY — DX: Vitamin B deficiency, unspecified: E53.9

## 2021-02-14 LAB — CBG MONITORING, ED: Glucose-Capillary: 83 mg/dL (ref 70–99)

## 2021-02-14 MED ORDER — LIDOCAINE HCL (PF) 1 % IJ SOLN
20.0000 mL | Freq: Once | INTRAMUSCULAR | Status: AC
Start: 2021-02-14 — End: 2021-02-14
  Administered 2021-02-14: 20 mL via INTRADERMAL
  Filled 2021-02-14: qty 20

## 2021-02-14 MED ORDER — TETANUS-DIPHTH-ACELL PERTUSSIS 5-2.5-18.5 LF-MCG/0.5 IM SUSY
0.5000 mL | PREFILLED_SYRINGE | Freq: Once | INTRAMUSCULAR | Status: AC
  Administered 2021-02-14: 0.5 mL via INTRAMUSCULAR
  Filled 2021-02-14: qty 0.5

## 2021-02-14 NOTE — ED Provider Notes (Signed)
[facility] Emergency Department Provider Note MRN:  [id number]  Arrival date & time: 02/14/21     Chief Complaint   Fall   History of Present Illness   [name] is a 88 y.o. year-old male with a history of CAD, prior Afib, HTN, HLD presenting to the ED with chief complaint of fall.  The patient is accompanied by his daughter who helps provides portions of his history.  The patient reports that he made a trip to the grocery store this afternoon to pick up some [place] bars.  When he was returning to his car from the grocery store he tripped while holding the [place] bars.  The patient states that he had a purely mechanical fall when his right foot caught his left foot and caused him to trip.  The patient reports that he had no loss of consciousness.  He did not feel palpitations, chest pain, shortness of breath prior to his fall.  The patient reports that he did however hit his head.  The patient had immediate pain to his left upper eyebrow.  The patient also had pain to his left wrist and right hand.  The patient has not been on anticoagulation for some time as he has been without recent atrial fibrillation.  Denies being on aspirin as well.  Review of Systems  A thorough review of systems was obtained and all systems are negative except as noted in the HPI and PMH.   Patient's Health History    Past Medical History:  Diagnosis Date   Arthralgia of multiple joints    limited mobility w/  independant adl's   BPH with obstruction/lower urinary tract symptoms    Chronic idiopathic monocytosis    followed by dr Waymon Budge--  per lov note 06/ 2017 persistant unexplained  with normal bone barrow bx   Coronary atherosclerosis of unspecified type of vessel, native or graft    cardiologist-  dr Stanford Breed -- per lov note 11-19-2014 ,  cardiac cath in 2000-- pLAD 60-70%,  small intermediate branch 70-80%,  mRCA 20%,  normal LV   Degenerative arthritis of  spine    cervical and lumar   Diverticulosis of colon (without mention of hemorrhage)    Dysuria    chronic   Elevated PSA    Feeling of incomplete bladder emptying    Hiatal hernia    moderate per ct 11/ 2017   History of adenomatous polyp of colon    2008   History of atrial fibrillation    remote hx episode   History of pancreatitis    07-01-2013  and 10-10-2015   History of prostatitis    2014   Hx of colonic polyps    Hyperlipidemia    Hypertension    Mouth sore    roof of mouth sore    Nephrolithiasis    Nocturia more than twice per night    severe  w/ leakage   OSA (obstructive sleep apnea)    INTOLERANT CPAP   Osteoarthritis    Pernicious anemia    B12  Def.   Peyronie disease    Renal insufficiency    Sepsis ([place]) 03/22/2017   Thrombocytopenia, unspecified ([place]) hemotology/oncologist-  dr Waymon Budge   per dr Velta Addison note 06/ 2016  secondary to vitro clumping   Vitamin B deficiency     Past Surgical History:  Procedure Laterality Date   BONE MARROW BIOPSY  2011   normal   CARDIAC CATHETERIZATION  2000  per dr Kathyrn Drown note --  dLAD 60-70%,  small intermediate branch 70-80%,  mRCA 20%,  normal LV   CARDIOVASCULAR STRESS TEST  01-29-2013   dr Stanford Breed   normal nuclear study w/ no ischemia,  normal LV function and wall motion , ef 82%   CATARACT EXTRACTION W/ INTRAOCULAR LENS  IMPLANT, BILATERAL  08/2015   CHOLECYSTECTOMY  11/24/2010   Procedure: LAPAROSCOPIC CHOLECYSTECTOMY WITH INTRAOPERATIVE CHOLANGIOGRAM;  Surgeon: Earnstine Regal, MD;  Location: [facility];  Service: General;  Laterality: N/A;  c-arm   CYSTOSCOPY W/ URETERAL STENT PLACEMENT Left 12/17/2015   Procedure: CYSTOSCOPY WITH RETROGRADE PYELOGRAM/URETERAL STENT PLACEMENT;  Surgeon: Ardis Hughs, MD;  Location: [facility];  Service: Urology;  Laterality: Left;   CYSTOSCOPY WITH RETROGRADE PYELOGRAM, URETEROSCOPY AND STENT PLACEMENT Left 12/22/2015   Procedure: CYSTOSCOPY WITH LEFT  RETROGRADE  URETEROSCOPY AND STENT PLACEMENT;  Surgeon: Carolan Clines, MD;  Location: [facility];  Service: Urology;  Laterality: Left;   ERCP N/A 07/04/2013   Procedure: ENDOSCOPIC RETROGRADE CHOLANGIOPANCREATOGRAPHY (ERCP);  Surgeon: Gatha Mayer, MD;  Location: [facility];  Service: Endoscopy;  Laterality: N/A;  MAC if available   ERCP N/A 10/12/2015   Procedure: ENDOSCOPIC RETROGRADE CHOLANGIOPANCREATOGRAPHY (ERCP);  Surgeon: [name], MD;  Location: [facility];  Service: Endoscopy;  Laterality: N/A;   HOLMIUM LASER APPLICATION Left 78/02/9560   Procedure: HOLMIUM LASER APPLICATION;  Surgeon: Carolan Clines, MD;  Location: [facility];  Service: Urology;  Laterality: Left;   INGUINAL HERNIA REPAIR Right 01/25/2002   KNEE ARTHROSCOPY  x5   SATURATION BIOPSY OF PROSTATE  05-29-2007  and 01-26-2008   SHOULDER ARTHROSCOPY WITH OPEN ROTATOR CUFF REPAIR AND DISTAL CLAVICLE ACROMINECTOMY Right 11/19/2004   TOTAL HIP ARTHROPLASTY Right 05/21/2009   TOTAL KNEE ARTHROPLASTY Bilateral left 05-02-2003/  right  03-23-2010   TRANSTHORACIC ECHOCARDIOGRAM  12/23/2004   ef 60%, mild MV calcification without stenosis/  mild TR,  PASP 10mHg   UVULOPALATOPHARYNGOPLASTY  1993    w/  T & A    Family History  Problem Relation Age of Onset   Colon cancer Father 417  Peripheral vascular disease Mother     Social History   Socioeconomic History   Marital status: Widowed    Spouse name: Not on file   Number of children: 2   Years of education: Not on file   Highest education level: Not on file  Occupational History   Occupation: retired    [employer] farm manager RETIRED  Tobacco Use   Smoking status: Former    Types: Pipe    Quit date: 01/18/1966    Years since quitting: 55.1   Smokeless tobacco: Never  Vaping Use   Vaping Use: Never used  Substance and Sexual Activity   Alcohol use: No    Alcohol/week: 0.0 standard drinks   Drug use: No   Sexual activity: Not  Currently  Other Topics Concern   Not on file  Social History Narrative   DNR   Widowed   2 daughters leave near by   Social Determinants of Health   Financial Resource Strain: Not on file  Food Insecurity: Not on file  Transportation Needs: Not on file  Physical Activity: Not on file  Stress: Not on file  Social Connections: Not on file  Intimate Partner Violence: Not on file     Physical Exam   Physical Exam Vitals and nursing note reviewed.  Constitutional:      Appearance: Normal appearance.  HENT:     Head: Normocephalic.     Nose: Nose normal.     Mouth/Throat:     Mouth: Mucous membranes are moist.     Pharynx: Oropharynx is clear.  Cardiovascular:     Rate and Rhythm: Normal rate and regular rhythm.  Pulmonary:     Effort: Pulmonary effort is normal.     Breath sounds: Normal breath sounds.  Chest:     Chest wall: No tenderness.  Abdominal:     General: Abdomen is flat. There is no distension.     Palpations: Abdomen is soft.     Tenderness: There is no abdominal tenderness.  Musculoskeletal:        General: No swelling, tenderness or deformity. Normal range of motion.     Cervical back: Normal range of motion and neck supple. No rigidity or tenderness.  Skin:    Findings: Lesion (Skin tear over left wrist.  1 cm laceration over right second digit.  3 cm laceration of her left eyebrow) present.  Neurological:     General: No focal deficit present.     Mental Status: He is alert and oriented to person, place, and time. Mental status is at baseline.     Cranial Nerves: No cranial nerve deficit.     Sensory: No sensory deficit.     Motor: No weakness.  Psychiatric:        Mood and Affect: Mood normal.      Diagnostic and Interventional Summary    EKG Interpretation  Date/Time:  Saturday February 14 2021 15:49:27 EST Ventricular Rate:  78 PR Interval:  173 QRS Duration: 121 QT Interval:  389 QTC Calculation: 414 R Axis:   -77 Text  Interpretation: Sinus rhythm Supraventricular bigeminy RBBB and LAFB Confirmed by Madalyn Rob [phone]) on 02/14/2021 4:12:45 PM       Labs Reviewed  CBG MONITORING, ED    CT Head Wo Contrast  Final Result    CT Cervical Spine Wo Contrast  Final Result    DG Wrist Complete Left  Final Result    DG Hand Complete Right  Final Result      Medications  Tdap (BOOSTRIX) injection 0.5 mL (0.5 mLs Intramuscular Given 02/14/21 1618)  lidocaine (PF) (XYLOCAINE) 1 % injection 20 mL (20 mLs Intradermal Given 02/14/21 1727)     Procedures  /  Critical Care .Marland KitchenLaceration Repair  Date/Time: 02/14/2021 9:24 PM Performed by: Zachery Dakins, MD Authorized by: Lucrezia Starch, MD   Consent:    Consent obtained:  Verbal   Consent given by:  Patient Anesthesia:    Anesthesia method:  Local infiltration   Local anesthetic:  Lidocaine 1% w/o epi Laceration details:    Location:  Scalp   Scalp location:  Frontal   Length (cm):  3   Depth (mm):  0.5 Pre-procedure details:    Preparation:  Patient was prepped and draped in usual sterile fashion and imaging obtained to evaluate for foreign bodies Exploration:    Imaging outcome: foreign body not noted   Treatment:    Amount of cleaning:  Standard   Irrigation solution:  Sterile saline   Irrigation method:  Syringe   Visualized foreign bodies/material removed: no   Skin repair:    Repair method:  Sutures   Suture size:  5-0   Suture material:  Chromic gut   Suture technique:  Simple interrupted   Number of sutures:  5 Approximation:    Approximation:  Close Repair  type:    Repair type:  Simple Post-procedure details:    Dressing:  Antibiotic ointment and non-adherent dressing   Procedure completion:  Tolerated well, no immediate complications .Marland KitchenLaceration Repair  Date/Time: 02/14/2021 9:35 PM Performed by: Zachery Dakins, MD Authorized by: Lucrezia Starch, MD   Consent:    Consent obtained:  Verbal   Consent  given by:  Patient Universal protocol:    Patient identity confirmed:  Verbally with patient Laceration details:    Location:  Hand   Hand location:  R hand, dorsum   Length (cm):  1.5 Exploration:    Imaging obtained: x-ray     Imaging outcome: foreign body not noted   Treatment:    Irrigation solution:  Sterile saline   Irrigation method:  Syringe   Visualized foreign bodies/material removed: no     Debridement:  None   Undermining:  None   Scar revision: no     Layers/structures repaired:  Deep dermal/superficial fascia Deep dermal/superficial fascia:    Number of sutures:  1 Skin repair:    Repair method:  Sutures   Suture size:  5-0   Suture material:  Chromic gut   Suture technique:  Horizontal mattress   Number of sutures:  1 Approximation:    Approximation:  Close Repair type:    Repair type:  Simple Post-procedure details:    Dressing:  Antibiotic ointment and non-adherent dressing   Procedure completion:  Tolerated well, no immediate complications  ED Course and Medical Decision Making  Initial Impression and Ddx 88 yo male who presents to emergency department after mechanical fall.  Differential diagnosis includes but not limited to the following: Skull fracture, intracranial hemorrhage, cervical spinal injury, acute fracture dislocation of his hand/wrist.  Will obtain imaging, EKG, and point care glucose to further assess.  Past medical/surgical history that increases complexity of ED encounter: Prior history of atrial fibrillation not currently on anticoagulation.  Interpretation of Diagnostics I personally reviewed the EKG and my interpretation is as follows:      CT head negative for skull fracture, no intracranial abnormalities noted.  CT cervical spine negative for acute fracture.  X-ray of left wrist negative for acute fracture dislocation.  X-ray of right hand negative for acute fracture or dislocation.  No foreign bodies noted over laceration  sites.  The laceration over the patient's left eyebrow was irrigated and closed without difficulty.  Please see procedure note for further details.  Laceration site over right middle finger was closed please see procedure note for further details.  Tolerated this without difficulty.  The patient's tetanus was updated.  The patient was able to ambulate after lacerations were repaired and imaging was completed.  Patient Reassessment and Ultimate Disposition/Management Will discharge patient home to self-care with instructions to follow-up with his primary care provider.  Patient management required discussion with the following services or consulting groups:  None  Complexity of Problems Addressed Acute illness or injury that poses threat of life of bodily function  Additional Data Reviewed and Analyzed Further history obtained from: Recent PCP notes and Recent Consult notes   Final Clinical Impressions(s) / ED Diagnoses     ICD-10-CM   1. Fall, initial encounter  W19.XXXA     2. Superficial laceration  [email or web address]       ED Discharge Orders     None        Discharge Instructions Discussed with and Provided to Patient:     Discharge Instructions  You were seen and evaluated in our emergency department after having a fall  Your head CT and cervical spine CT were without acute injury.  There were no acute fracture seen on x-rays of your right hand and left wrist.  Your tetanus was updated.  Your lacerations were repaired with suture material that will dissolve on its own.  Please continue to follow with your primary care physician.        Zachery Dakins, MD 02/14/21 2158    Lucrezia Starch, MD 02/14/21 2223
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
## 2021-02-14 NOTE — Discharge Instructions (Signed)
You were seen and evaluated in our emergency department after having a fall  Your head CT and cervical spine CT were without acute injury.  There were no acute fracture seen on x-rays of your right hand and left wrist.  Your tetanus was updated.  Your lacerations were repaired with suture material that will dissolve on its own.  Please continue to follow with your primary care physician.

## 2021-03-09 ENCOUNTER — Ambulatory Visit: Payer: Medicare HMO | Admitting: Primary Care

## 2021-03-17 ENCOUNTER — Encounter: Payer: Self-pay | Admitting: Primary Care

## 2021-03-17 ENCOUNTER — Other Ambulatory Visit: Payer: Self-pay

## 2021-03-17 ENCOUNTER — Ambulatory Visit (INDEPENDENT_AMBULATORY_CARE_PROVIDER_SITE_OTHER): Payer: Medicare HMO | Admitting: Primary Care

## 2021-03-17 VITALS — BP 110/62 | HR 102 | Temp 98.6°F | Ht 60.0 in | Wt 140.0 lb

## 2021-03-17 DIAGNOSIS — I10 Essential (primary) hypertension: Secondary | ICD-10-CM | POA: Diagnosis not present

## 2021-03-17 DIAGNOSIS — R296 Repeated falls: Secondary | ICD-10-CM

## 2021-03-17 DIAGNOSIS — E538 Deficiency of other specified B group vitamins: Secondary | ICD-10-CM

## 2021-03-17 DIAGNOSIS — N401 Enlarged prostate with lower urinary tract symptoms: Secondary | ICD-10-CM

## 2021-03-17 DIAGNOSIS — M25511 Pain in right shoulder: Secondary | ICD-10-CM

## 2021-03-17 DIAGNOSIS — G8929 Other chronic pain: Secondary | ICD-10-CM

## 2021-03-17 DIAGNOSIS — R351 Nocturia: Secondary | ICD-10-CM | POA: Diagnosis not present

## 2021-03-17 DIAGNOSIS — F331 Major depressive disorder, recurrent, moderate: Secondary | ICD-10-CM

## 2021-03-17 DIAGNOSIS — D696 Thrombocytopenia, unspecified: Secondary | ICD-10-CM

## 2021-03-17 DIAGNOSIS — R69 Illness, unspecified: Secondary | ICD-10-CM | POA: Diagnosis not present

## 2021-03-17 DIAGNOSIS — M25512 Pain in left shoulder: Secondary | ICD-10-CM

## 2021-03-17 LAB — BASIC METABOLIC PANEL
BUN: 13 mg/dL (ref 6–23)
CO2: 29 mEq/L (ref 19–32)
Calcium: 9 mg/dL (ref 8.4–10.5)
Chloride: 107 mEq/L (ref 96–112)
Creatinine, Ser: 1.07 mg/dL (ref 0.40–1.50)
GFR: 63.3 mL/min (ref >60.00)
Glucose, Bld: 98 mg/dL (ref 70–99)
Potassium: 4.2 mEq/L (ref 3.5–5.1)
Sodium: 142 mEq/L (ref 135–145)

## 2021-03-17 LAB — CBC
HCT: 47.3 % (ref 39.0–52.0)
Hemoglobin: 15.8 g/dL (ref 13.0–17.0)
MCHC: 33.5 g/dL (ref 30.0–36.0)
MCV: 81.9 fl (ref 78.0–100.0)
Platelets: 50 10*3/uL — ABNORMAL LOW (ref 150.0–400.0)
RBC: 5.77 Mil/uL (ref 4.22–5.81)
RDW: 15.2 % (ref 11.5–15.5)
WBC: 10.8 10*3/uL — ABNORMAL HIGH (ref 4.0–10.5)

## 2021-03-17 LAB — VITAMIN B12: Vitamin B-12: 596 pg/mL (ref 211–911)

## 2021-03-17 MED ORDER — CYANOCOBALAMIN 1000 MCG/ML IJ SOLN
1000.0000 ug | Freq: Once | INTRAMUSCULAR | Status: AC
  Administered 2021-03-17: 1000 ug via INTRAMUSCULAR

## 2021-03-17 NOTE — Assessment & Plan Note (Signed)
No concerns today.  Removed Flomax 0.4 mg from chart as he does not wish to take.  Following with Urology.

## 2021-03-17 NOTE — Assessment & Plan Note (Signed)
Recent ED visit for fall with 2 lacerations.  Reviewed ED notes, labs, imaging.  No sutures visible on exam today. Strongly advised he start using a cane for support when walking.  Offered home health physical therapy for which he kindly declines.  Continue to monitor.

## 2021-03-17 NOTE — Progress Notes (Signed)
Subjective:    Patient ID: [name], male    DOB: 1933-05-12, 88 y.o.   MRN: [id number]  HPI  [name] is a very pleasant 88 y.o. male with a history of hypertension, sleep apnea, GERD, BPH, thrombocytopenia, depression, anxiety who presents today for ED follow-up and general follow-up.  1) MDD: Currently prescribed Zoloft 25 mg for depression, has been prescribed this several times but he never initiated.  During his last visit in July 2022 he was ready to start so prescription was renewed.  Today he endorses never starting Zoloft 25 mg as he read the side effects and became concerned.   He continues to experience depression.   2) BPH: Following with Urology, prescribed tamsulosin 0.4 mg daily. Today he endorses not taking tamsulosin as it was ineffective. He is taking an OTC pill nightly which is working better.   3) ED Follow Up: He presented to [facility] on 02/14/2021 after a fall.  He was at the grocery store, returning to his car, tripped over his feet while holding some groceries, fell and hit his head. He had reports of left upper eyebrow pain, left wrist pain, right hand pain.  During his ED visit his left frontal laceration was sutured with 5 simple interrupted sutures, his right 3rd digit laceration was closed with 1 suture.  He underwent CT head which was negative for acute abnormalities, CT cervical spine which was negative, x-ray of left wrist and right hand which were both negative.  He was provided with a tetanus vaccination.  He was discharged home later that day with recommendations for PCP follow-up.  Today he endorses that he removed his sutures himself about 4 weeks ago. He did not count the sutures that were removed. He denies headaches, dizziness.  He did fall a week ago while walking up his neighbors porch, did not hit his head, was able to catch himself on the rail.  He does not use a cane due to "pride".  4) Vitamin B12 Deficiency: Historically managed on  monthly B12 injections, he does have a history of intermittent compliance.  He underwent a B12 injection in August 2022, December 2022, January 2023.   He is due for repeat B12 lab check today.  5) Chronic Shoulder Pain: Chronic bilaterally. Limited range of motion to bilateral shoulders. Following with orthopedics who completed xrays of his shoulders and didn't recommend surgical intervention.   He's completed PT but this became cost prohibitive so he quit. He does have instructions for home PT but he does not do these.    Review of Systems  Respiratory:  Negative for shortness of breath.   Cardiovascular:  Negative for chest pain.  Musculoskeletal:  Positive for arthralgias.  Neurological:  Negative for dizziness and headaches.  Psychiatric/Behavioral:         See HPI        Past Medical History:  Diagnosis Date   Arthralgia of multiple joints    limited mobility w/  independant adl's   BPH with obstruction/lower urinary tract symptoms    Chronic idiopathic monocytosis    followed by dr Waymon Budge--  per lov note 06/ 2017 persistant unexplained  with normal bone barrow bx   Coronary atherosclerosis of unspecified type of vessel, native or graft    cardiologist-  dr Stanford Breed -- per lov note 11-19-2014 ,  cardiac cath in 2000-- pLAD 60-70%,  small intermediate branch 70-80%,  mRCA 20%,  normal LV   Degenerative arthritis of  spine    cervical and lumar   Diverticulosis of colon (without mention of hemorrhage)    Dysuria    chronic   Elevated PSA    Feeling of incomplete bladder emptying    Hiatal hernia    moderate per ct 11/ 2017   History of adenomatous polyp of colon    2008   History of atrial fibrillation    remote hx episode   History of pancreatitis    07-01-2013  and 10-10-2015   History of prostatitis    2014   Hx of colonic polyps    Hyperlipidemia    Hypertension    Mouth sore    roof of mouth sore    Nephrolithiasis    Nocturia more than twice per  night    severe  w/ leakage   OSA (obstructive sleep apnea)    INTOLERANT CPAP   Osteoarthritis    Pernicious anemia    B12  Def.   Peyronie disease    Renal insufficiency    Sepsis ([place]) 03/22/2017   Thrombocytopenia, unspecified ([place]) hemotology/oncologist-  dr Waymon Budge   per dr Velta Addison note 06/ 2016  secondary to vitro clumping   Vitamin B deficiency     Social History   Socioeconomic History   Marital status: Widowed    Spouse name: Not on file   Number of children: 2   Years of education: Not on file   Highest education level: Not on file  Occupational History   Occupation: retired    Fish farm manager: RETIRED  Tobacco Use   Smoking status: Former    Types: Pipe    Quit date: 01/18/1966    Years since quitting: 55.1   Smokeless tobacco: Never  Vaping Use   Vaping Use: Never used  Substance and Sexual Activity   Alcohol use: No    Alcohol/week: 0.0 standard drinks   Drug use: No   Sexual activity: Not Currently  Other Topics Concern   Not on file  Social History Narrative   DNR   Widowed   2 daughters leave near by   Social Determinants of Health   Financial Resource Strain: Not on file  Food Insecurity: Not on file  Transportation Needs: Not on file  Physical Activity: Not on file  Stress: Not on file  Social Connections: Not on file  Intimate Partner Violence: Not on file    Past Surgical History:  Procedure Laterality Date   BONE MARROW BIOPSY  2011   normal   CARDIAC CATHETERIZATION  2000   per dr Kathyrn Drown note --  dLAD 60-70%,  small intermediate branch 70-80%,  mRCA 20%,  normal LV   CARDIOVASCULAR STRESS TEST  01-29-2013   dr Stanford Breed   normal nuclear study w/ no ischemia,  normal LV function and wall motion , ef 82%   CATARACT EXTRACTION W/ INTRAOCULAR LENS  IMPLANT, BILATERAL  08/2015   CHOLECYSTECTOMY  11/24/2010   Procedure: LAPAROSCOPIC CHOLECYSTECTOMY WITH INTRAOPERATIVE CHOLANGIOGRAM;  Surgeon: Earnstine Regal, MD;  Location: [facility];  Service: General;  Laterality: N/A;  c-arm   CYSTOSCOPY W/ URETERAL STENT PLACEMENT Left 12/17/2015   Procedure: CYSTOSCOPY WITH RETROGRADE PYELOGRAM/URETERAL STENT PLACEMENT;  Surgeon: Ardis Hughs, MD;  Location: [facility];  Service: Urology;  Laterality: Left;   CYSTOSCOPY WITH RETROGRADE PYELOGRAM, URETEROSCOPY AND STENT PLACEMENT Left 12/22/2015   Procedure: CYSTOSCOPY WITH LEFT RETROGRADE  URETEROSCOPY AND STENT PLACEMENT;  Surgeon: Carolan Clines, MD;  Location: [facility];  Service: Urology;  Laterality: Left;   ERCP N/A 07/04/2013   Procedure: ENDOSCOPIC RETROGRADE CHOLANGIOPANCREATOGRAPHY (ERCP);  Surgeon: Gatha Mayer, MD;  Location: [facility];  Service: Endoscopy;  Laterality: N/A;  MAC if available   ERCP N/A 10/12/2015   Procedure: ENDOSCOPIC RETROGRADE CHOLANGIOPANCREATOGRAPHY (ERCP);  Surgeon: [name], MD;  Location: [facility];  Service: Endoscopy;  Laterality: N/A;   HOLMIUM LASER APPLICATION Left 95/01/8839   Procedure: HOLMIUM LASER APPLICATION;  Surgeon: Carolan Clines, MD;  Location: [facility];  Service: Urology;  Laterality: Left;   INGUINAL HERNIA REPAIR Right 01/25/2002   KNEE ARTHROSCOPY  x5   SATURATION BIOPSY OF PROSTATE  05-29-2007  and 01-26-2008   SHOULDER ARTHROSCOPY WITH OPEN ROTATOR CUFF REPAIR AND DISTAL CLAVICLE ACROMINECTOMY Right 11/19/2004   TOTAL HIP ARTHROPLASTY Right 05/21/2009   TOTAL KNEE ARTHROPLASTY Bilateral left 05-02-2003/  right  03-23-2010   TRANSTHORACIC ECHOCARDIOGRAM  12/23/2004   ef 60%, mild MV calcification without stenosis/  mild TR,  PASP 29mHg   UVULOPALATOPHARYNGOPLASTY  1993    w/  T & A    Family History  Problem Relation Age of Onset   Colon cancer Father 465  Peripheral vascular disease Mother     Allergies  Allergen Reactions   Penicillins Hives and Other (See Comments)    [place], passed out Tolerates cephalosporins  Has patient had a PCN reaction causing  immediate rash, facial/tongue/throat swelling, SOB or lightheadedness with hypotension:  yes Has patient had a PCN reaction causing severe rash involving mucus membranes or skin necrosis: no Has patient had a PCN reaction that required hospitalization: no Has patient had a PCN reaction occurring within the last 10 years: no If all of the above answers are "NO", then may proceed with Cephalosporin use.     Current Outpatient Medications on File Prior to Visit  Medication Sig Dispense Refill   acetaminophen (TYLENOL) 325 MG tablet Take 650 mg by mouth every 6 (six) hours as needed for mild pain.     Artificial Tear Solution (TEARS NATURALE OP) Place 1-2 drops into both eyes at bedtime as needed (dry eyes).     Multiple Vitamin (MULTIVITAMIN) tablet Take 1 tablet by mouth daily.     Loratadine (CLARITIN PO) Take 1 tablet by mouth daily.      sertraline (ZOLOFT) 25 MG tablet TAKE 1 TABLET (25 MG TOTAL) BY MOUTH DAILY. FOR ANXIETY AND DEPRESSION (Patient not taking: Reported on 03/17/2021) 90 tablet 1   tamsulosin (FLOMAX) 0.4 MG CAPS capsule Take 0.4 mg by mouth in the morning and at bedtime. (Patient not taking: Reported on 08/12/2020)     No current facility-administered medications on file prior to visit.    BP 110/62    Pulse (!) 102    Temp 98.6 F (37 C) (Temporal)    Ht 5' (1.524 m)    Wt 140 lb (63.5 kg)    SpO2 100%    BMI 27.34 kg/m  Objective:   Physical Exam Cardiovascular:     Rate and Rhythm: Normal rate and regular rhythm.  Pulmonary:     Effort: Pulmonary effort is normal.     Breath sounds: Normal breath sounds. No wheezing or rales.  Musculoskeletal:     Cervical back: Neck supple.  Skin:    General: Skin is warm and dry.     Comments: Well-healed scar to left eyebrow, no sutures remaining.  No surrounding erythema.  Well-healed laceration to right PIP joint of third  digit, no sutures remaining.  No surrounding erythema.  Neurological:     Mental Status: He is alert  and oriented to person, place, and time.          Assessment & Plan:      This visit occurred during the SARS-CoV-2 public health emergency.  Safety protocols were in place, including screening questions prior to the visit, additional usage of staff PPE, and extensive cleaning of exam room while observing appropriate contact time as indicated for disinfecting solutions.

## 2021-03-17 NOTE — Assessment & Plan Note (Signed)
Evaluated by hematology previously, no need for continued follow-up.  Repeat CBC pending.

## 2021-03-17 NOTE — Assessment & Plan Note (Signed)
Controlled.  Continue without medications.  Continue to monitor.

## 2021-03-17 NOTE — Patient Instructions (Signed)
Stop by the lab prior to leaving today. I will notify you of your results once received.   Start sertraline (Zoloft) 25 mg for anxiety and depression.  Take 1 tablet by mouth once daily.  Please consider using a cane when walking!  It was a pleasure to see you today!

## 2021-03-17 NOTE — Assessment & Plan Note (Signed)
Chronic, continued.  Long discussion regarding his ongoing symptoms, potential side effects of medications.   He agrees to try Zoloft 25 mg which is already at the pharmacy. Discussed this today too.  Start Zoloft 25 mg daily.  Will continue to monitor.

## 2021-03-17 NOTE — Addendum Note (Signed)
Addended by: Francella Solian on: 03/17/2021 03:02 PM   Modules accepted: Orders

## 2021-03-17 NOTE — Assessment & Plan Note (Signed)
Chronic and continued.  Not a candidate for surgery.   Strongly advised he work on home physical therapy handout as recommended.  We also discussed the potential for steroid injections to the shoulders, he will contact his orthopedist

## 2021-04-22 ENCOUNTER — Ambulatory Visit: Payer: Medicare HMO

## 2021-05-06 ENCOUNTER — Ambulatory Visit (INDEPENDENT_AMBULATORY_CARE_PROVIDER_SITE_OTHER): Payer: Medicare HMO | Admitting: Primary Care

## 2021-05-06 ENCOUNTER — Encounter: Payer: Self-pay | Admitting: Primary Care

## 2021-05-06 VITALS — BP 124/76 | HR 69 | Temp 97.8°F | Ht 60.0 in | Wt 134.0 lb

## 2021-05-06 DIAGNOSIS — R5382 Chronic fatigue, unspecified: Secondary | ICD-10-CM | POA: Diagnosis not present

## 2021-05-06 DIAGNOSIS — R410 Disorientation, unspecified: Secondary | ICD-10-CM

## 2021-05-06 DIAGNOSIS — F32A Depression, unspecified: Secondary | ICD-10-CM

## 2021-05-06 DIAGNOSIS — G8929 Other chronic pain: Secondary | ICD-10-CM | POA: Diagnosis not present

## 2021-05-06 DIAGNOSIS — M25511 Pain in right shoulder: Secondary | ICD-10-CM

## 2021-05-06 DIAGNOSIS — M25512 Pain in left shoulder: Secondary | ICD-10-CM

## 2021-05-06 DIAGNOSIS — E538 Deficiency of other specified B group vitamins: Secondary | ICD-10-CM

## 2021-05-06 DIAGNOSIS — R296 Repeated falls: Secondary | ICD-10-CM

## 2021-05-06 DIAGNOSIS — F331 Major depressive disorder, recurrent, moderate: Secondary | ICD-10-CM

## 2021-05-06 MED ORDER — CYANOCOBALAMIN 1000 MCG/ML IJ SOLN
1000.0000 ug | Freq: Once | INTRAMUSCULAR | Status: AC
  Administered 2021-05-06: 1000 ug via INTRAMUSCULAR

## 2021-05-06 MED ORDER — SERTRALINE HCL 25 MG PO TABS: 25.0000 mg | ORAL_TABLET | Freq: Every day | ORAL | 0 refills | Status: DC

## 2021-05-06 NOTE — Assessment & Plan Note (Addendum)
Uncontrolled.  ?He contracts to safety today. ? ?We had a frank discussion about the need to try something for depression treatment and to allow me to help him. He's either refused treatment for depression or has been noncompliant with treatment regimen prescribed.   ? ?He declines therapy today. ? ?He agrees to start sertraline 25 mg daily. New Rx sent [facility] as he doesn't want to drive to [place]. ? ?Follow-up in 4 weeks for depression, sooner if needed. ?

## 2021-05-06 NOTE — Assessment & Plan Note (Signed)
Continued. ? ?He finally accepts my offer for home health physical therapy for balance and strength. ? ?Referral placed. ?

## 2021-05-06 NOTE — Assessment & Plan Note (Signed)
Endorsed by patient today. ? ?He was able to hold a completely normal conversation during HPI given specific details.  No repetitive comments. No behavior today to suggest dementia. ? ?We discussed how the description of his behavior getting lost while driving is concerning.  He gives me permission to reach out to his daughter who lives nearby.  I will call his daughter to notify her of our conversation.  She is on his DPR. ? ?

## 2021-05-06 NOTE — Assessment & Plan Note (Addendum)
Ongoing, has not started sertraline despite numerous recommendations.  ? ?Today he agrees to start sertraline 25 mg daily.  ?Labs reviewed from visit in late February 2023.  ? ?Continue to closely monitor.  ? ?

## 2021-05-06 NOTE — Patient Instructions (Addendum)
Call the pharmacy to request refills of your sertraline (Zoloft) medication for depression.  Take 1 tablet by mouth once daily. ? ?You will be contacted regarding your referral to home health physical therapy.  Please let us know if you have not been contacted within two weeks.  ? ?Schedule follow-up visit with me in 1 month for depression follow-up. ? ?Schedule a visit with Dr. Lorelei Pont for your shoulders. ? ?It was a pleasure to see you today! ? ? ?

## 2021-05-06 NOTE — Assessment & Plan Note (Addendum)
Inconsistent story today, will request most recent records from [facility]. I do not see records in his chart. ? ?He wants a second opinion anyway. He will schedule an appointment with Sports Medicine.  ? ?We discussed to start Tylenol 500 mg once to twice daily as he is not taking anything consistently.  ?

## 2021-05-06 NOTE — Progress Notes (Signed)
? ?Subjective:  ? ? Patient ID: [name], male    DOB: [birth year], 88 y.o.   MRN: [id number] ? ?HPI ? ?[name] is a very pleasant 88 y.o. male with a history of hypertension, CAD, GERD, BPH, thrombocytopenia, anxiety and depression, chronic fatigue, hyperlipidemia, vitamin B12 deficiency, osteoarthritis, shoulder pain who presents today to this chronic fatigue, shoulder pain. ? ?1) Chronic Fatigue: History of fatigue, depression, vitamin B12 deficiency.  Currently prescribed monthly B12 injections for which he is to receive in our office, he does not complete these monthly as recommended.  Evaluated numerous times in our office for depression, prescribed sertraline for which he never takes. ? ?Today he states "my brain is dying". He cannot remember anything, he gets lost when driving on the streets in [place]. He continues to feel depressed. He would like to have his B12 injection today. His B12 lab was checked 6 weeks ago which was 596.  ? ?He lives alone and is sedentary for most of his day.  He was working part-time, but has not worked since [employer] 19 infection months ago.  His daughter lives nearby, calls him "once a week if I am lucky". ? ?2) Chronic Shoulder Pain: Chronic for years, bilateral rotator cuffs "are shot", not a surgical candidate.  He was evaluated in our office in February 2023 for ongoing pain, he mentioned he would reach out to his orthopedist for shoulder injections.  ? ?Today he endorses ongoing bilateral shoulder pain with decrease in ROM to bilateral upper extremities. He endorses that he went to the orthopedic office last month, completed xrays, was told to start physical therapy but he cannot afford. This is no record of this on file.  ? ?He's tried Tylenol and Ibuprofen as needed with temporary improvement.  He does not take anything consistently for his pain.  He has tried a few home physical therapy exercises. ? ?3) Recurrent Falls: Multiple falls over the  last few months, loses his balance. He has both a cane and walker for which he uses.  He has no cane with him today.  During his office visit in late February 2023 he mentioned recurrent falls. I offered home health PT for which he declined.  He is willing to participate in home health PT now ? ?4) Depression: Chronic and ongoing, has yet to start Zoloft 25 mg despite agreeing to starting last visit and several prior visits. He began reading the label of potential side effects and became concerned.  ? ?Today he continues to notice ongoing depression, was at the bank recently and told one of the tellers that "I want to just blow my brains out". Today he doesn't want to shoot himself as "it's too messy and would cause some problems".  ? ?His daughter is aware of his depression and prior suicidal thoughts.  He does not wish for [place] to notify his daughter of these thoughts as she will "call and cuss me out". She lives a few houses down and contacts him once weekly.  ? ?He spends most of his days at home by himself.  He lost his wife several years ago. ? ? ?Review of Systems  ?Constitutional:  Positive for fatigue.  ?Respiratory:  Negative for shortness of breath.   ?Cardiovascular:  Negative for chest pain.  ?Musculoskeletal:  Positive for arthralgias.  ?Skin:  Negative for color change.  ?Psychiatric/Behavioral:  Positive for confusion.   ?     See HPI  ? ?   ? ? ?  Past Medical History:  ?Diagnosis Date  ? Arthralgia of multiple joints   ? limited mobility w/  independant adl's  ? BPH with obstruction/lower urinary tract symptoms   ? Chronic idiopathic monocytosis   ? followed by dr Waymon Budge--  per lov note 06/ 2017 persistant unexplained  with normal bone barrow bx  ? Coronary atherosclerosis of unspecified type of vessel, native or graft   ? cardiologist-  dr Stanford Breed -- per lov note 11-19-2014 ,  cardiac cath in 2000-- pLAD 60-70%,  small intermediate branch 70-80%,  mRCA 20%,  normal LV  ? Degenerative arthritis  of spine   ? cervical and lumar  ? Diverticulosis of colon (without mention of hemorrhage)   ? Dysuria   ? chronic  ? Elevated PSA   ? Feeling of incomplete bladder emptying   ? Hiatal hernia   ? moderate per ct 11/ 2017  ? History of adenomatous polyp of colon   ? 2008  ? History of atrial fibrillation   ? remote hx episode  ? History of pancreatitis   ? 07-01-2013  and 10-10-2015  ? History of prostatitis   ? 2014  ? Hx of colonic polyps   ? Hyperlipidemia   ? Hypertension   ? Mouth sore   ? roof of mouth sore   ? Nephrolithiasis   ? Nocturia more than twice per night   ? severe  w/ leakage  ? OSA (obstructive sleep apnea)   ? INTOLERANT CPAP  ? Osteoarthritis   ? Pernicious anemia   ? B12  Def.  ? Peyronie disease   ? Renal insufficiency   ? Sepsis ([place]) 03/22/2017  ? Thrombocytopenia, unspecified ([place]) hemotology/oncologist-  dr Waymon Budge  ? per dr Velta Addison note 06/ 2016  secondary to vitro clumping  ? Vitamin B deficiency   ? ? ?Social History  ? ?Socioeconomic History  ? Marital status: Widowed  ?  Spouse name: Not on file  ? Number of children: 2  ? Years of education: Not on file  ? Highest education level: Not on file  ?Occupational History  ? Occupation: retired  ?  Employer: RETIRED  ?Tobacco Use  ? Smoking status: Former  ?  Types: Pipe  ?  Quit date: 01/18/1966  ?  Years since quitting: 55.3  ? Smokeless tobacco: Never  ?Vaping Use  ? Vaping Use: Never used  ?Substance and Sexual Activity  ? Alcohol use: No  ?  Alcohol/week: 0.0 standard drinks  ? Drug use: No  ? Sexual activity: Not Currently  ?Other Topics Concern  ? Not on file  ?Social History Narrative  ? DNR  ? Widowed  ? 2 daughters leave near by  ? ?Social Determinants of Health  ? ?Financial Resource Strain: Not on file  ?Food Insecurity: Not on file  ?Transportation Needs: Not on file  ?Physical Activity: Not on file  ?Stress: Not on file  ?Social Connections: Not on file  ?Intimate Partner Violence: Not on file  ? ? ?Past Surgical  History:  ?Procedure Laterality Date  ? BONE MARROW BIOPSY  2011  ? normal  ? CARDIAC CATHETERIZATION  2000  ? per dr Kathyrn Drown note --  dLAD 60-70%,  small intermediate branch 70-80%,  mRCA 20%,  normal LV  ? CARDIOVASCULAR STRESS TEST  01-29-2013   dr Stanford Breed  ? normal nuclear study w/ no ischemia,  normal LV function and wall motion , ef 82%  ? CATARACT EXTRACTION W/ INTRAOCULAR LENS  IMPLANT, BILATERAL  08/2015  ? CHOLECYSTECTOMY  11/24/2010  ? Procedure: LAPAROSCOPIC CHOLECYSTECTOMY WITH INTRAOPERATIVE CHOLANGIOGRAM;  Surgeon: Earnstine Regal, MD;  Location: [facility];  Service: General;  Laterality: N/A;  c-arm  ? CYSTOSCOPY W/ URETERAL STENT PLACEMENT Left 12/17/2015  ? Procedure: CYSTOSCOPY WITH RETROGRADE PYELOGRAM/URETERAL STENT PLACEMENT;  Surgeon: Ardis Hughs, MD;  Location: [facility];  Service: Urology;  Laterality: Left;  ? CYSTOSCOPY WITH RETROGRADE PYELOGRAM, URETEROSCOPY AND STENT PLACEMENT Left 12/22/2015  ? Procedure: CYSTOSCOPY WITH LEFT RETROGRADE  URETEROSCOPY AND STENT PLACEMENT;  Surgeon: Carolan Clines, MD;  Location: [facility];  Service: Urology;  Laterality: Left;  ? ERCP N/A 07/04/2013  ? Procedure: ENDOSCOPIC RETROGRADE CHOLANGIOPANCREATOGRAPHY (ERCP);  Surgeon: Gatha Mayer, MD;  Location: [facility];  Service: Endoscopy;  Laterality: N/A;  MAC if available  ? ERCP N/A 10/12/2015  ? Procedure: ENDOSCOPIC RETROGRADE CHOLANGIOPANCREATOGRAPHY (ERCP);  Surgeon: [name], MD;  Location: [facility];  Service: Endoscopy;  Laterality: N/A;  ? HOLMIUM LASER APPLICATION Left 02/23/4860  ? Procedure: HOLMIUM LASER APPLICATION;  Surgeon: Carolan Clines, MD;  Location: [facility];  Service: Urology;  Laterality: Left;  ? INGUINAL HERNIA REPAIR Right 01/25/2002  ? KNEE ARTHROSCOPY  x5  ? SATURATION BIOPSY OF PROSTATE  05-29-2007  and 01-26-2008  ? SHOULDER ARTHROSCOPY WITH OPEN ROTATOR CUFF REPAIR AND DISTAL CLAVICLE ACROMINECTOMY Right 11/19/2004  ?  TOTAL HIP ARTHROPLASTY Right 05/21/2009  ? TOTAL KNEE ARTHROPLASTY Bilateral left 05-02-2003/  right  03-23-2010  ? TRANSTHORACIC ECHOCARDIOGRAM  12/23/2004  ? ef 60%, mild MV calcification without stenosi

## 2021-06-02 ENCOUNTER — Telehealth: Payer: Self-pay | Admitting: Primary Care

## 2021-06-02 NOTE — Telephone Encounter (Signed)
Left message for patient to call back and schedule Medicare Annual Wellness Visit (AWV) either virtually or phone ? ?Last AWV 07/17/18 ?; please schedule at anytime with health coach ? ?I left my direct # [phone] ?

## 2021-06-05 ENCOUNTER — Ambulatory Visit (INDEPENDENT_AMBULATORY_CARE_PROVIDER_SITE_OTHER): Payer: Medicare HMO | Admitting: Primary Care

## 2021-06-05 ENCOUNTER — Encounter: Payer: Self-pay | Admitting: Primary Care

## 2021-06-05 ENCOUNTER — Telehealth: Payer: Self-pay

## 2021-06-05 VITALS — BP 110/62 | HR 99 | Temp 97.9°F | Ht 60.0 in | Wt 131.0 lb

## 2021-06-05 DIAGNOSIS — E538 Deficiency of other specified B group vitamins: Secondary | ICD-10-CM | POA: Diagnosis not present

## 2021-06-05 DIAGNOSIS — N401 Enlarged prostate with lower urinary tract symptoms: Secondary | ICD-10-CM | POA: Diagnosis not present

## 2021-06-05 DIAGNOSIS — G8929 Other chronic pain: Secondary | ICD-10-CM

## 2021-06-05 DIAGNOSIS — R3912 Poor urinary stream: Secondary | ICD-10-CM | POA: Diagnosis not present

## 2021-06-05 DIAGNOSIS — F331 Major depressive disorder, recurrent, moderate: Secondary | ICD-10-CM

## 2021-06-05 DIAGNOSIS — R296 Repeated falls: Secondary | ICD-10-CM | POA: Diagnosis not present

## 2021-06-05 DIAGNOSIS — K649 Unspecified hemorrhoids: Secondary | ICD-10-CM | POA: Diagnosis not present

## 2021-06-05 DIAGNOSIS — M25512 Pain in left shoulder: Secondary | ICD-10-CM

## 2021-06-05 DIAGNOSIS — M25511 Pain in right shoulder: Secondary | ICD-10-CM | POA: Diagnosis not present

## 2021-06-05 MED ORDER — TAMSULOSIN HCL 0.4 MG PO CAPS: 0.4000 mg | ORAL_CAPSULE | Freq: Every day | ORAL | 3 refills | Status: DC

## 2021-06-05 MED ORDER — CYANOCOBALAMIN 1000 MCG/ML IJ SOLN
1000.0000 ug | Freq: Once | INTRAMUSCULAR | Status: AC
  Administered 2021-06-05: 1000 ug via INTRAMUSCULAR

## 2021-06-05 NOTE — Patient Instructions (Signed)
Increase your sertraline (Zoloft) medication, take 2 pills once daily for depression.  Start tamsulosin (Flomax) 0.4 mg every night for urine flow.   Have the physical therapist look at your shoulder.  It was a pleasure to see you today!

## 2021-06-05 NOTE — Progress Notes (Signed)
Subjective:    Patient ID: [name], male    DOB: 1933/06/06, 88 y.o.   MRN: [id number]  HPI  [name] is a very pleasant 88 y.o. male with a history of MDD, hypertension, Covid-19 infection, vitamin B12 deficiency, chronic anxiety, chronic fatigue who presents today for follow up from last visit.  1) Depression: Chronic, uncontrolled. Patient has declined treatment or has been non compliant. During her last visit in April 2023 he agreed to try sertraline 25 mg daily that had been sent to his pharmacy previously. He declined therapy. He was asked to follow up 4 weeks later.   Since his last visit he has started Zoloft 25 mg and not noticed improvement.  He endorses daily compliance.   2) Chronic Shoulder Pain: Chronic for years. During his last visit he endorsed a recent visit with orthopedics, however, no recent office visit was noted in the chart. I recommended he schedule an appointment with our Sports Medicine doctor and to start Tylenol 500 mg 1-2 times daily.  Since his last visit he has yet to see Dr. Lorelei Pont. Physical therapy will be coming to the house today.   3) Recurrent Falls: Numerous falls over the last few months, imbalance. During this visit he agreed for home health PT, he agreed. Home health PT ordered.  Since his last visit he has been visited by home health nursing. Physical therapy will be coming out today. He denies falls since our last visit. He does not use his cane or walker regularly.   4) Chronic Hemorrhoid: Chronic for the last 10 years. History of hemorrhoid surgery years ago. Over the last year the hemorrhoid has become more bothersome. He denies rectal bleeding. He has had to bathe each time he has a bowel movement as his stool moves around is hemorrhoid.   5) BPH: Chronic history. Previously managed on Flomax 0.4 mg per Urology. During a visit in February 2023 he endorsed he wanted to stop taking Flomax. Over the last few months he's noticed  weak urinary stream. He is interested in resuming.   Review of Systems  Constitutional:  Positive for fatigue.  Respiratory:  Negative for shortness of breath.   Cardiovascular:  Negative for chest pain.  Genitourinary:  Positive for difficulty urinating.  Musculoskeletal:  Positive for arthralgias.  Psychiatric/Behavioral:  The patient is not nervous/anxious.        Depression         Past Medical History:  Diagnosis Date   Arthralgia of multiple joints    limited mobility w/  independant adl's   BPH with obstruction/lower urinary tract symptoms    Chronic idiopathic monocytosis    followed by dr Waymon Budge--  per lov note 06/ 2017 persistant unexplained  with normal bone barrow bx   Coronary atherosclerosis of unspecified type of vessel, native or graft    cardiologist-  dr Stanford Breed -- per lov note 11-19-2014 ,  cardiac cath in 2000-- pLAD 60-70%,  small intermediate branch 70-80%,  mRCA 20%,  normal LV   Degenerative arthritis of spine    cervical and lumar   Diverticulosis of colon (without mention of hemorrhage)    Dysuria    chronic   Elevated PSA    Feeling of incomplete bladder emptying    Hiatal hernia    moderate per ct 11/ 2017   History of adenomatous polyp of colon    2008   History of atrial fibrillation    remote hx episode  History of COVID-19 03/05/2020   History of pancreatitis    07-01-2013  and 10-10-2015   History of prostatitis    2014   Hx of colonic polyps    Hyperlipidemia    Hypertension    Mouth sore    roof of mouth sore    Nephrolithiasis    Nocturia more than twice per night    severe  w/ leakage   OSA (obstructive sleep apnea)    INTOLERANT CPAP   Osteoarthritis    Pernicious anemia    B12  Def.   Peyronie disease    Renal insufficiency    Sepsis ([place]) 03/22/2017   Thrombocytopenia, unspecified ([place]) hemotology/oncologist-  dr Waymon Budge   per dr Velta Addison note 06/ 2016  secondary to vitro clumping   Vitamin B  deficiency     Social History   Socioeconomic History   Marital status: Widowed    Spouse name: Not on file   Number of children: 2   Years of education: Not on file   Highest education level: Not on file  Occupational History   Occupation: retired    Fish farm manager: RETIRED  Tobacco Use   Smoking status: Former    Types: Pipe    Quit date: 01/18/1966    Years since quitting: 55.4   Smokeless tobacco: Never  Vaping Use   Vaping Use: Never used  Substance and Sexual Activity   Alcohol use: No    Alcohol/week: 0.0 standard drinks   Drug use: No   Sexual activity: Not Currently  Other Topics Concern   Not on file  Social History Narrative   DNR   Widowed   2 daughters leave near by   Social Determinants of Health   Financial Resource Strain: Not on file  Food Insecurity: Not on file  Transportation Needs: Not on file  Physical Activity: Not on file  Stress: Not on file  Social Connections: Not on file  Intimate Partner Violence: Not on file    Past Surgical History:  Procedure Laterality Date   BONE MARROW BIOPSY  2011   normal   CARDIAC CATHETERIZATION  2000   per dr Kathyrn Drown note --  dLAD 60-70%,  small intermediate branch 70-80%,  mRCA 20%,  normal LV   CARDIOVASCULAR STRESS TEST  01-29-2013   dr Stanford Breed   normal nuclear study w/ no ischemia,  normal LV function and wall motion , ef 82%   CATARACT EXTRACTION W/ INTRAOCULAR LENS  IMPLANT, BILATERAL  08/2015   CHOLECYSTECTOMY  11/24/2010   Procedure: LAPAROSCOPIC CHOLECYSTECTOMY WITH INTRAOPERATIVE CHOLANGIOGRAM;  Surgeon: Earnstine Regal, MD;  Location: [facility];  Service: General;  Laterality: N/A;  c-arm   CYSTOSCOPY W/ URETERAL STENT PLACEMENT Left 12/17/2015   Procedure: CYSTOSCOPY WITH RETROGRADE PYELOGRAM/URETERAL STENT PLACEMENT;  Surgeon: Ardis Hughs, MD;  Location: [facility];  Service: Urology;  Laterality: Left;   CYSTOSCOPY WITH RETROGRADE PYELOGRAM, URETEROSCOPY AND STENT PLACEMENT Left 12/22/2015    Procedure: CYSTOSCOPY WITH LEFT RETROGRADE  URETEROSCOPY AND STENT PLACEMENT;  Surgeon: Carolan Clines, MD;  Location: [facility];  Service: Urology;  Laterality: Left;   ERCP N/A 07/04/2013   Procedure: ENDOSCOPIC RETROGRADE CHOLANGIOPANCREATOGRAPHY (ERCP);  Surgeon: Gatha Mayer, MD;  Location: [facility];  Service: Endoscopy;  Laterality: N/A;  MAC if available   ERCP N/A 10/12/2015   Procedure: ENDOSCOPIC RETROGRADE CHOLANGIOPANCREATOGRAPHY (ERCP);  Surgeon: [name], MD;  Location: [facility];  Service: Endoscopy;  Laterality: N/A;   HOLMIUM  LASER APPLICATION Left 96/02/2295   Procedure: HOLMIUM LASER APPLICATION;  Surgeon: Carolan Clines, MD;  Location: [facility];  Service: Urology;  Laterality: Left;   INGUINAL HERNIA REPAIR Right 01/25/2002   KNEE ARTHROSCOPY  x5   SATURATION BIOPSY OF PROSTATE  05-29-2007  and 01-26-2008   SHOULDER ARTHROSCOPY WITH OPEN ROTATOR CUFF REPAIR AND DISTAL CLAVICLE ACROMINECTOMY Right 11/19/2004   TOTAL HIP ARTHROPLASTY Right 05/21/2009   TOTAL KNEE ARTHROPLASTY Bilateral left 05-02-2003/  right  03-23-2010   TRANSTHORACIC ECHOCARDIOGRAM  12/23/2004   ef 60%, mild MV calcification without stenosis/  mild TR,  PASP 71mHg   UVULOPALATOPHARYNGOPLASTY  1993    w/  T & A    Family History  Problem Relation Age of Onset   Colon cancer Father 454  Peripheral vascular disease Mother     Allergies  Allergen Reactions   Penicillins Hives and Other (See Comments)    [place], passed out Tolerates cephalosporins  Has patient had a PCN reaction causing immediate rash, facial/tongue/throat swelling, SOB or lightheadedness with hypotension:  yes Has patient had a PCN reaction causing severe rash involving mucus membranes or skin necrosis: no Has patient had a PCN reaction that required hospitalization: no Has patient had a PCN reaction occurring within the last 10 years: no If all of the above answers are "NO", then  may proceed with Cephalosporin use.     Current Outpatient Medications on File Prior to Visit  Medication Sig Dispense Refill   acetaminophen (TYLENOL) 325 MG tablet Take 650 mg by mouth every 6 (six) hours as needed for mild pain.     Artificial Tear Solution (TEARS NATURALE OP) Place 1-2 drops into both eyes at bedtime as needed (dry eyes).     Ensure (ENSURE) Take 237 mLs by mouth.     sertraline (ZOLOFT) 25 MG tablet Take 1 tablet (25 mg total) by mouth daily. For depression 90 tablet 0   No current facility-administered medications on file prior to visit.    BP 110/62   Pulse 99   Temp 97.9 F (36.6 C) (Oral)   Ht 5' (1.524 m)   Wt 131 lb (59.4 kg)   SpO2 95%   BMI 25.58 kg/m  Objective:   Physical Exam Cardiovascular:     Rate and Rhythm: Normal rate and regular rhythm.  Pulmonary:     Effort: Pulmonary effort is normal.     Breath sounds: Normal breath sounds. No wheezing or rales.  Genitourinary:    Comments: Patient declines rectal exam Musculoskeletal:     Cervical back: Neck supple.  Skin:    General: Skin is warm and dry.  Neurological:     Mental Status: He is alert and oriented to person, place, and time.          Assessment & Plan:      This visit occurred during the SARS-CoV-2 public health emergency.  Safety protocols were in place, including screening questions prior to the visit, additional usage of staff PPE, and extensive cleaning of exam room while observing appropriate contact time as indicated for disinfecting solutions.

## 2021-06-05 NOTE — Assessment & Plan Note (Signed)
Commended him on daily compliance to Zoloft.  Unfortunately he's not noticed improvement so we will increase Zoloft to 50 mg daily. Discussed to take 2 of his 25 mg pills once daily.   Follow up in 1 month

## 2021-06-05 NOTE — Assessment & Plan Note (Signed)
Follow up with home health PT today.

## 2021-06-05 NOTE — Addendum Note (Signed)
Addended by: Francella Solian on: 06/05/2021 11:46 AM   Modules accepted: Orders

## 2021-06-05 NOTE — Telephone Encounter (Signed)
Approved.  

## 2021-06-05 NOTE — Assessment & Plan Note (Signed)
Advised to set up a visit with Dr. Lorelei Pont. Home health PT coming out today

## 2021-06-05 NOTE — Assessment & Plan Note (Signed)
Patient refused rectal exam today.  I offered GI referral for which he kindly declines.  He will notify when he is ready.

## 2021-06-05 NOTE — Telephone Encounter (Signed)
Stephanie with [facility] calling for verbal orders for OT 1 x a week for 6 weeks. CB [phone]

## 2021-06-05 NOTE — Assessment & Plan Note (Signed)
Symptoms returned.  Resume Flomax 0.4 mg daily. Patient agrees.  Rx sent to pharmacy.

## 2021-06-08 ENCOUNTER — Telehealth: Payer: Self-pay

## 2021-06-08 NOTE — Telephone Encounter (Signed)
Unable to reach pt or [name] at any contact # no answer and no v/m I did speak with [name] (DPR signed)  princess PT with [facility] just left pt home; pt was expressing taking morphine and just going to sleep. Princess said before she left she felt pt was in a better place and was not going to harm himself per Anastasiya CMA. [name] said that she was just now going to her dad's home. [name] said sometimes he goes to sleep and does not hear phone. [name] also said that today was the 8 yr anniversary of pts wife passing. [name] is not sure if pt is taking the sertraline 50 mg daily or not since seeing Anda Kraft on 06/05/21. [name] said she will call now and go ck on her dad also. [name] said that she will cb on 06/09/21 with update on how pt is doing. UC & ED precautions given and [name] voiced understanding. Sending note to Gentry Fitz NP and Lavina Hamman CMA who are out of office and to Dr Darnell Level and Lattie Haw CMA who are in office. I will make sure Dr Darnell Level sees this note.

## 2021-06-08 NOTE — Telephone Encounter (Signed)
[name], with [facility], called at 4:45 pm today stating she saw patient today for a visit (not sure what time) patient expressed suicidial thoughts, depression is worse and medication not helping, just wants to sleep. Patient told [name] he wants to take Morphine tablets-has a bottle, and just go to sleep. [name] did not find a bottle of this medication when she looked. She talked to the patient and tried to re direct his thoughts. Patient did not want her to call 911. Patient was stable when she left him and felt safe to leave him after their conversation and he did not seem to be dangerous to himself or others. Patient expressed that he felt like he was been dismissed when he would express these thoughts to [place]. [name] said its almost like patient is getting prepared to die, he is looking for a home for his dog because he can not take care of him. [name] is going to see patient on Friday again. I spoke with Mearl Latin about this situation and sent her the phone note. [name] CB is [phone]

## 2021-06-08 NOTE — Telephone Encounter (Signed)
Called spoke to stephanie verbal orders given no further questions at this time.

## 2021-06-09 NOTE — Telephone Encounter (Signed)
Please update Princess:  I have not been dismissive of patient's symptoms, and in fact, there are many documented visits of patient refusing treatment. He finally agreed to starting treatment during his visit in April 2023, and during his most recent visit in May 2023 we increased his dose of sertraline to 50 mg. We discussed that if his symptoms became worse then to notify me.   I have spoken with his daughter previously. The patient never allows me to contact his daughter as he accuses her of being "dismissive" to his overall feelings. I will contact one of his daughters as they are both on DPR.   Of note, I've encouraged home health PT during his last several visits given his falls (all documented), he had declined until just recently. Another example of how we have attempted to help but he has refused care.

## 2021-06-09 NOTE — Telephone Encounter (Signed)
Noted, will evaluate patient with his daughter.

## 2021-06-09 NOTE — Telephone Encounter (Signed)
I spoke with [name] (DPR signed) sharon did see pt on 06/08/21 and pt advised [name] that he could turn his depression off when he needed to. Pt told [name] he was doing OK. [name] did not see any signs pt would harm himself or anyone else. [name] spoke with pt this morning and pt did not go to sleep until after 2 AM so pt was sleeping late this morning. [name] wonders if zoloft could be causing insomnia or if zoloft could be taken at different time of day or pt started on trazodone.[name] also wants referral to podiatrist due to all pts toes having fungus. I asked [name] if she could come to appt with pt and she said yes she would be at appt. Appt scheduled with Gentry Fitz NP on 06/10/21 at [place]. UC & ED precautions given and [name] voiced understanding.  I also spoke with Jorene Minors PT with [facility] and updated as Allie Bossier NP advised and Princess voiced understanding. Princess is aware pt has appt with Allie Bossier NP on 06/10/21 and Jorene Minors has appt to see pt again on 06/12/21. Sending FYI to Gentry Fitz NP.

## 2021-06-10 ENCOUNTER — Ambulatory Visit (INDEPENDENT_AMBULATORY_CARE_PROVIDER_SITE_OTHER): Payer: Medicare HMO | Admitting: Primary Care

## 2021-06-10 ENCOUNTER — Encounter: Payer: Self-pay | Admitting: Primary Care

## 2021-06-10 VITALS — BP 104/58 | HR 113 | Temp 98.6°F | Ht 60.0 in | Wt 133.0 lb

## 2021-06-10 DIAGNOSIS — G4700 Insomnia, unspecified: Secondary | ICD-10-CM | POA: Diagnosis not present

## 2021-06-10 DIAGNOSIS — B351 Tinea unguium: Secondary | ICD-10-CM

## 2021-06-10 DIAGNOSIS — F331 Major depressive disorder, recurrent, moderate: Secondary | ICD-10-CM | POA: Diagnosis not present

## 2021-06-10 MED ORDER — TRAZODONE HCL 50 MG PO TABS
25.0000 mg | ORAL_TABLET | Freq: Every day | ORAL | 0 refills | Status: DC
Start: 2021-06-10 — End: 2021-08-18

## 2021-06-10 NOTE — Progress Notes (Signed)
Subjective:    Patient ID: [name], male    DOB: Aug 06, 1933, 88 y.o.   MRN: [id number]  HPI  [name] is a very pleasant 88 y.o. male with a history of hypertension, coronary atherosclerosis, sleep apnea, GERD, BPH, depression, pernicious anemia, chronic fatigue who presents today to discuss depression.  His daughter joins [place] today.  He was last evaluated by me on 06/05/2021 for follow-up of depression and other medical conditions.  During this visit he endorsed no improvement with Zoloft 25 mg that was initiated 1 month prior.  He confirmed daily compliance.  Given no improvement, we increase his Zoloft to 50 mg daily and advised him to follow-up 1 month later.  His home health nurse called our office on 06/08/2021 reporting the patient to be expressing suicidal thoughts, worsening depression, no improvement with Zoloft medication.  The patient also notified the home health nurse that I had been dismissive to his symptoms of depression, despite numerous prior visits for evaluation and treatment of depression. His daughter was contacted who reported that "he (her father) could turn his depression off and on when he needed to".  She checked on the patient and verified that no self-harm was done.  She was asked to come to his appointment today.  Today he endorses a chronic history of difficulty sleeping, has been taking Zoloft 50 mg every evening. His daughter suspects Zoloft could be contributing to some of his insomnia. His daughter would like for him to try Trazodone as she and her sister have been successful on this regimen. He continues to feel depressed, mourns the loss of his wife. The anniversary to his wife's passing was the day he mentioned suicidal thoughts to his home health nurse.   His daughter is interested in getting him set up with therapy. She has the name of a local agency. He does have thoughts of not wanting to wake up from sleep, but has no plan to harm himself.    2) Toenail Fungus: Chronic for years to multiple toes. He's tried OTC treatment without improvement. He would like to try prescription treatment. He would like to establish with a podiatrist for routine foot care as he cannot cut his toenails. His toenails have become elongated and thick.   Review of Systems  Musculoskeletal:  Positive for arthralgias.  Skin:        Onychomycosis, elongated and thickened toenails  Psychiatric/Behavioral:  Positive for sleep disturbance. Negative for self-injury.        See HPI        Past Medical History:  Diagnosis Date   Arthralgia of multiple joints    limited mobility w/  independant adl's   BPH with obstruction/lower urinary tract symptoms    Chronic idiopathic monocytosis    followed by dr Waymon Budge--  per lov note 06/ 2017 persistant unexplained  with normal bone barrow bx   Coronary atherosclerosis of unspecified type of vessel, native or graft    cardiologist-  dr Stanford Breed -- per lov note 11-19-2014 ,  cardiac cath in 2000-- pLAD 60-70%,  small intermediate branch 70-80%,  mRCA 20%,  normal LV   Degenerative arthritis of spine    cervical and lumar   Diverticulosis of colon (without mention of hemorrhage)    Dysuria    chronic   Elevated PSA    Feeling of incomplete bladder emptying    Hiatal hernia    moderate per ct 11/ 2017   History of adenomatous polyp  of colon    2008   History of atrial fibrillation    remote hx episode   History of COVID-19 03/05/2020   History of pancreatitis    07-01-2013  and 10-10-2015   History of prostatitis    2014   Hx of colonic polyps    Hyperlipidemia    Hypertension    Mouth sore    roof of mouth sore    Nephrolithiasis    Nocturia more than twice per night    severe  w/ leakage   OSA (obstructive sleep apnea)    INTOLERANT CPAP   Osteoarthritis    Pernicious anemia    B12  Def.   Peyronie disease    Renal insufficiency    Sepsis ([place]) 03/22/2017   Thrombocytopenia, unspecified  ([place]) hemotology/oncologist-  dr Waymon Budge   per dr Velta Addison note 06/ 2016  secondary to vitro clumping   Vitamin B deficiency     Social History   Socioeconomic History   Marital status: Widowed    Spouse name: Not on file   Number of children: 2   Years of education: Not on file   Highest education level: Not on file  Occupational History   Occupation: retired    Fish farm manager: RETIRED  Tobacco Use   Smoking status: Former    Types: Pipe    Quit date: 01/18/1966    Years since quitting: 55.4   Smokeless tobacco: Never  Vaping Use   Vaping Use: Never used  Substance and Sexual Activity   Alcohol use: No    Alcohol/week: 0.0 standard drinks   Drug use: No   Sexual activity: Not Currently  Other Topics Concern   Not on file  Social History Narrative   DNR   Widowed   2 daughters leave near by   Social Determinants of Health   Financial Resource Strain: Not on file  Food Insecurity: Not on file  Transportation Needs: Not on file  Physical Activity: Not on file  Stress: Not on file  Social Connections: Not on file  Intimate Partner Violence: Not on file    Past Surgical History:  Procedure Laterality Date   BONE MARROW BIOPSY  2011   normal   CARDIAC CATHETERIZATION  2000   per dr Kathyrn Drown note --  dLAD 60-70%,  small intermediate branch 70-80%,  mRCA 20%,  normal LV   CARDIOVASCULAR STRESS TEST  01-29-2013   dr Stanford Breed   normal nuclear study w/ no ischemia,  normal LV function and wall motion , ef 82%   CATARACT EXTRACTION W/ INTRAOCULAR LENS  IMPLANT, BILATERAL  08/2015   CHOLECYSTECTOMY  11/24/2010   Procedure: LAPAROSCOPIC CHOLECYSTECTOMY WITH INTRAOPERATIVE CHOLANGIOGRAM;  Surgeon: Earnstine Regal, MD;  Location: [facility];  Service: General;  Laterality: N/A;  c-arm   CYSTOSCOPY W/ URETERAL STENT PLACEMENT Left 12/17/2015   Procedure: CYSTOSCOPY WITH RETROGRADE PYELOGRAM/URETERAL STENT PLACEMENT;  Surgeon: Ardis Hughs, MD;  Location: [facility];   Service: Urology;  Laterality: Left;   CYSTOSCOPY WITH RETROGRADE PYELOGRAM, URETEROSCOPY AND STENT PLACEMENT Left 12/22/2015   Procedure: CYSTOSCOPY WITH LEFT RETROGRADE  URETEROSCOPY AND STENT PLACEMENT;  Surgeon: Carolan Clines, MD;  Location: [facility];  Service: Urology;  Laterality: Left;   ERCP N/A 07/04/2013   Procedure: ENDOSCOPIC RETROGRADE CHOLANGIOPANCREATOGRAPHY (ERCP);  Surgeon: Gatha Mayer, MD;  Location: [facility];  Service: Endoscopy;  Laterality: N/A;  MAC if available   ERCP N/A 10/12/2015   Procedure: ENDOSCOPIC RETROGRADE CHOLANGIOPANCREATOGRAPHY (  ERCP);  Surgeon: [name], MD;  Location: [facility];  Service: Endoscopy;  Laterality: N/A;   HOLMIUM LASER APPLICATION Left 62/09/5282   Procedure: HOLMIUM LASER APPLICATION;  Surgeon: Carolan Clines, MD;  Location: [facility];  Service: Urology;  Laterality: Left;   INGUINAL HERNIA REPAIR Right 01/25/2002   KNEE ARTHROSCOPY  x5   SATURATION BIOPSY OF PROSTATE  05-29-2007  and 01-26-2008   SHOULDER ARTHROSCOPY WITH OPEN ROTATOR CUFF REPAIR AND DISTAL CLAVICLE ACROMINECTOMY Right 11/19/2004   TOTAL HIP ARTHROPLASTY Right 05/21/2009   TOTAL KNEE ARTHROPLASTY Bilateral left 05-02-2003/  right  03-23-2010   TRANSTHORACIC ECHOCARDIOGRAM  12/23/2004   ef 60%, mild MV calcification without stenosis/  mild TR,  PASP 22mHg   UVULOPALATOPHARYNGOPLASTY  1993    w/  T & A    Family History  Problem Relation Age of Onset   Colon cancer Father 483  Peripheral vascular disease Mother     Allergies  Allergen Reactions   Penicillins Hives and Other (See Comments)    [place], passed out Tolerates cephalosporins  Has patient had a PCN reaction causing immediate rash, facial/tongue/throat swelling, SOB or lightheadedness with hypotension:  yes Has patient had a PCN reaction causing severe rash involving mucus membranes or skin necrosis: no Has patient had a PCN reaction that required  hospitalization: no Has patient had a PCN reaction occurring within the last 10 years: no If all of the above answers are "NO", then may proceed with Cephalosporin use.     Current Outpatient Medications on File Prior to Visit  Medication Sig Dispense Refill   acetaminophen (TYLENOL) 325 MG tablet Take 650 mg by mouth every 6 (six) hours as needed for mild pain.     Artificial Tear Solution (TEARS NATURALE OP) Place 1-2 drops into both eyes at bedtime as needed (dry eyes).     Ensure (ENSURE) Take 237 mLs by mouth.     sertraline (ZOLOFT) 25 MG tablet Take 1 tablet (25 mg total) by mouth daily. For depression (Patient taking differently: Take 50 mg by mouth daily. For depression) 90 tablet 0   tamsulosin (FLOMAX) 0.4 MG CAPS capsule Take 1 capsule (0.4 mg total) by mouth daily. For urine flow 90 capsule 3   No current facility-administered medications on file prior to visit.    BP (!) 104/58   Pulse (!) 113   Temp 98.6 F (37 C) (Oral)   Ht 5' (1.524 m)   Wt 133 lb (60.3 kg)   SpO2 94%   BMI 25.97 kg/m  Objective:   Physical Exam Cardiovascular:     Rate and Rhythm: Normal rate and regular rhythm.  Pulmonary:     Effort: Pulmonary effort is normal.     Breath sounds: Normal breath sounds. No wheezing or rales.  Musculoskeletal:     Cervical back: Neck supple.  Skin:    General: Skin is warm and dry.     Comments: Evidence of onychomycosis to nearly every toenail bilaterally. Thickened and elongated toenails noted bilaterally.   Neurological:     Mental Status: He is alert and oriented to person, place, and time.  Psychiatric:        Mood and Affect: Mood normal.          Assessment & Plan:

## 2021-06-10 NOTE — Assessment & Plan Note (Signed)
Long discussion today regarding his thoughts of self arm, when to seek emergent help, and when to call our office. He assures me that he has no intension of self harm today.   Continue Zoloft 50 mg (2- 25 mg tabs) daily. Start Trazodone 50 mg HS.  His daughter will look into local therapy groups.  Close follow up scheduled for next month.

## 2021-06-10 NOTE — Assessment & Plan Note (Signed)
Mostly secondary to depression and anxiety.  Trial of Trazodone 50 mg sent to pharmacy. Follow up in 1 month.

## 2021-06-10 NOTE — Patient Instructions (Signed)
Continue taking 2 of your sertraline (Zoloft) tablets for depression. Take this in the morning.  Start Trazodone 50 mg for sleep. Take 1/2 to 1 full tablet every evening at bedtime.  You will be contacted regarding your referral to podiatry.  Please let us know if you have not been contacted within two weeks.   Stop by the lab prior to leaving today. I will notify you of your results once received.   It was a pleasure to see you today!

## 2021-06-10 NOTE — Assessment & Plan Note (Signed)
Evident on exam bilaterally.  Checking CMP today.   Start terbinafine 250 mg daily if labs are stable. Referral placed to podiatry

## 2021-06-11 ENCOUNTER — Other Ambulatory Visit: Payer: Self-pay | Admitting: Primary Care

## 2021-06-11 DIAGNOSIS — R17 Unspecified jaundice: Secondary | ICD-10-CM

## 2021-06-11 DIAGNOSIS — R7989 Other specified abnormal findings of blood chemistry: Secondary | ICD-10-CM

## 2021-06-11 LAB — COMPREHENSIVE METABOLIC PANEL
ALT: 236 U/L — ABNORMAL HIGH (ref 0–53)
AST: 174 U/L — ABNORMAL HIGH (ref 0–37)
Albumin: 3.8 g/dL (ref 3.5–5.2)
Alkaline Phosphatase: 473 U/L — ABNORMAL HIGH (ref 39–117)
BUN: 15 mg/dL (ref 6–23)
CO2: 24 mEq/L (ref 19–32)
Calcium: 9.2 mg/dL (ref 8.4–10.5)
Chloride: 105 mEq/L (ref 96–112)
Creatinine, Ser: 1.49 mg/dL (ref 0.40–1.50)
GFR: 42.47 mL/min — ABNORMAL LOW (ref >60.00)
Glucose, Bld: 154 mg/dL — ABNORMAL HIGH (ref 70–99)
Potassium: 4.8 mEq/L (ref 3.5–5.1)
Sodium: 136 mEq/L (ref 135–145)
Total Bilirubin: 9.2 mg/dL — ABNORMAL HIGH (ref 0.2–1.2)
Total Protein: 5.5 g/dL — ABNORMAL LOW (ref 6.0–8.3)

## 2021-06-12 ENCOUNTER — Telehealth: Payer: Self-pay | Admitting: Gastroenterology

## 2021-06-12 ENCOUNTER — Emergency Department (HOSPITAL_COMMUNITY): Payer: Medicare HMO

## 2021-06-12 ENCOUNTER — Other Ambulatory Visit: Payer: Self-pay

## 2021-06-12 ENCOUNTER — Encounter (HOSPITAL_COMMUNITY): Payer: Self-pay | Admitting: Emergency Medicine

## 2021-06-12 ENCOUNTER — Telehealth: Payer: Self-pay

## 2021-06-12 ENCOUNTER — Ambulatory Visit
Admission: RE | Admit: 2021-06-12 | Discharge: 2021-06-12 | Disposition: A | Discharge status: Home / Self Care | Payer: Medicare HMO | Source: Ambulatory Visit | Attending: Primary Care | Admitting: Primary Care

## 2021-06-12 ENCOUNTER — Other Ambulatory Visit: Payer: Self-pay | Admitting: Primary Care

## 2021-06-12 ENCOUNTER — Inpatient Hospital Stay (HOSPITAL_COMMUNITY)
Admission: EM | Admit: 2021-06-12 | Discharge: 2021-06-14 | DRG: 948 | Disposition: A | Discharge status: Home / Self Care | Payer: Medicare HMO | Attending: Internal Medicine | Admitting: Internal Medicine

## 2021-06-12 DIAGNOSIS — N400 Enlarged prostate without lower urinary tract symptoms: Secondary | ICD-10-CM | POA: Diagnosis not present

## 2021-06-12 DIAGNOSIS — M25519 Pain in unspecified shoulder: Secondary | ICD-10-CM | POA: Diagnosis present

## 2021-06-12 DIAGNOSIS — H9190 Unspecified hearing loss, unspecified ear: Secondary | ICD-10-CM | POA: Diagnosis present

## 2021-06-12 DIAGNOSIS — F419 Anxiety disorder, unspecified: Secondary | ICD-10-CM | POA: Diagnosis not present

## 2021-06-12 DIAGNOSIS — Z9841 Cataract extraction status, right eye: Secondary | ICD-10-CM

## 2021-06-12 DIAGNOSIS — Z86010 Personal history of colonic polyps: Secondary | ICD-10-CM

## 2021-06-12 DIAGNOSIS — R17 Unspecified jaundice: Secondary | ICD-10-CM

## 2021-06-12 DIAGNOSIS — Z800 Family history of malignant neoplasm of digestive organs: Secondary | ICD-10-CM

## 2021-06-12 DIAGNOSIS — Z87891 Personal history of nicotine dependence: Secondary | ICD-10-CM

## 2021-06-12 DIAGNOSIS — E876 Hypokalemia: Secondary | ICD-10-CM | POA: Diagnosis present

## 2021-06-12 DIAGNOSIS — Z880 Allergy status to penicillin: Secondary | ICD-10-CM

## 2021-06-12 DIAGNOSIS — F32A Depression, unspecified: Secondary | ICD-10-CM | POA: Diagnosis present

## 2021-06-12 DIAGNOSIS — K219 Gastro-esophageal reflux disease without esophagitis: Secondary | ICD-10-CM | POA: Diagnosis not present

## 2021-06-12 DIAGNOSIS — K8309 Other cholangitis: Secondary | ICD-10-CM

## 2021-06-12 DIAGNOSIS — Z66 Do not resuscitate: Secondary | ICD-10-CM | POA: Diagnosis not present

## 2021-06-12 DIAGNOSIS — G8929 Other chronic pain: Secondary | ICD-10-CM | POA: Diagnosis present

## 2021-06-12 DIAGNOSIS — Z96653 Presence of artificial knee joint, bilateral: Secondary | ICD-10-CM

## 2021-06-12 DIAGNOSIS — Z8249 Family history of ischemic heart disease and other diseases of the circulatory system: Secondary | ICD-10-CM

## 2021-06-12 DIAGNOSIS — G4700 Insomnia, unspecified: Secondary | ICD-10-CM | POA: Diagnosis not present

## 2021-06-12 DIAGNOSIS — D696 Thrombocytopenia, unspecified: Secondary | ICD-10-CM | POA: Diagnosis not present

## 2021-06-12 DIAGNOSIS — I10 Essential (primary) hypertension: Secondary | ICD-10-CM | POA: Diagnosis present

## 2021-06-12 DIAGNOSIS — B351 Tinea unguium: Secondary | ICD-10-CM | POA: Diagnosis present

## 2021-06-12 DIAGNOSIS — E785 Hyperlipidemia, unspecified: Secondary | ICD-10-CM | POA: Diagnosis present

## 2021-06-12 DIAGNOSIS — E538 Deficiency of other specified B group vitamins: Secondary | ICD-10-CM

## 2021-06-12 DIAGNOSIS — R7989 Other specified abnormal findings of blood chemistry: Secondary | ICD-10-CM | POA: Diagnosis not present

## 2021-06-12 DIAGNOSIS — G4733 Obstructive sleep apnea (adult) (pediatric): Secondary | ICD-10-CM

## 2021-06-12 DIAGNOSIS — Z8616 Personal history of COVID-19: Secondary | ICD-10-CM | POA: Diagnosis not present

## 2021-06-12 DIAGNOSIS — D7289 Other specified disorders of white blood cells: Secondary | ICD-10-CM | POA: Diagnosis not present

## 2021-06-12 DIAGNOSIS — Z79899 Other long term (current) drug therapy: Secondary | ICD-10-CM | POA: Diagnosis not present

## 2021-06-12 DIAGNOSIS — R935 Abnormal findings on diagnostic imaging of other abdominal regions, including retroperitoneum: Secondary | ICD-10-CM | POA: Diagnosis not present

## 2021-06-12 DIAGNOSIS — Z9842 Cataract extraction status, left eye: Secondary | ICD-10-CM

## 2021-06-12 DIAGNOSIS — M47812 Spondylosis without myelopathy or radiculopathy, cervical region: Secondary | ICD-10-CM | POA: Diagnosis present

## 2021-06-12 DIAGNOSIS — M47816 Spondylosis without myelopathy or radiculopathy, lumbar region: Secondary | ICD-10-CM | POA: Diagnosis present

## 2021-06-12 DIAGNOSIS — R5382 Chronic fatigue, unspecified: Secondary | ICD-10-CM | POA: Diagnosis not present

## 2021-06-12 DIAGNOSIS — Z96641 Presence of right artificial hip joint: Secondary | ICD-10-CM

## 2021-06-12 DIAGNOSIS — I2510 Atherosclerotic heart disease of native coronary artery without angina pectoris: Secondary | ICD-10-CM | POA: Diagnosis present

## 2021-06-12 DIAGNOSIS — M4186 Other forms of scoliosis, lumbar region: Secondary | ICD-10-CM | POA: Diagnosis not present

## 2021-06-12 DIAGNOSIS — R932 Abnormal findings on diagnostic imaging of liver and biliary tract: Secondary | ICD-10-CM | POA: Diagnosis not present

## 2021-06-12 DIAGNOSIS — N281 Cyst of kidney, acquired: Secondary | ICD-10-CM | POA: Diagnosis not present

## 2021-06-12 DIAGNOSIS — K4090 Unilateral inguinal hernia, without obstruction or gangrene, not specified as recurrent: Secondary | ICD-10-CM | POA: Diagnosis not present

## 2021-06-12 DIAGNOSIS — F331 Major depressive disorder, recurrent, moderate: Secondary | ICD-10-CM | POA: Diagnosis not present

## 2021-06-12 DIAGNOSIS — M1990 Unspecified osteoarthritis, unspecified site: Secondary | ICD-10-CM

## 2021-06-12 DIAGNOSIS — R7401 Elevation of levels of liver transaminase levels: Secondary | ICD-10-CM | POA: Diagnosis not present

## 2021-06-12 DIAGNOSIS — I728 Aneurysm of other specified arteries: Secondary | ICD-10-CM | POA: Diagnosis not present

## 2021-06-12 DIAGNOSIS — J9811 Atelectasis: Secondary | ICD-10-CM | POA: Diagnosis not present

## 2021-06-12 DIAGNOSIS — Z9049 Acquired absence of other specified parts of digestive tract: Secondary | ICD-10-CM

## 2021-06-12 DIAGNOSIS — D849 Immunodeficiency, unspecified: Secondary | ICD-10-CM

## 2021-06-12 DIAGNOSIS — J341 Cyst and mucocele of nose and nasal sinus: Secondary | ICD-10-CM | POA: Diagnosis not present

## 2021-06-12 DIAGNOSIS — Z8719 Personal history of other diseases of the digestive system: Secondary | ICD-10-CM

## 2021-06-12 DIAGNOSIS — K5730 Diverticulosis of large intestine without perforation or abscess without bleeding: Secondary | ICD-10-CM | POA: Diagnosis not present

## 2021-06-12 LAB — URINALYSIS, ROUTINE W REFLEX MICROSCOPIC
Bacteria, UA: NONE SEEN
Bilirubin Urine: NEGATIVE
Glucose, UA: NEGATIVE mg/dL
Hgb urine dipstick: NEGATIVE
Ketones, ur: NEGATIVE mg/dL
Nitrite: NEGATIVE
Protein, ur: NEGATIVE mg/dL
Specific Gravity, Urine: >1.046 — ABNORMAL HIGH (ref 1.005–1.030)
pH: 5 (ref 5.0–8.0)

## 2021-06-12 LAB — CBC
HCT: 44.3 % (ref 39.0–52.0)
Hemoglobin: 14.5 g/dL (ref 13.0–17.0)
MCH: 27.5 pg (ref 26.0–34.0)
MCHC: 32.7 g/dL (ref 30.0–36.0)
MCV: 83.9 fL (ref 80.0–100.0)
Platelets: 64 10*3/uL — ABNORMAL LOW (ref 150–400)
RBC: 5.28 MIL/uL (ref 4.22–5.81)
RDW: 15.9 % — ABNORMAL HIGH (ref 11.5–15.5)
WBC: 15.2 10*3/uL — ABNORMAL HIGH (ref 4.0–10.5)
nRBC: 0 % (ref 0.0–0.2)

## 2021-06-12 LAB — COMPREHENSIVE METABOLIC PANEL
ALT: 137 U/L — ABNORMAL HIGH (ref 0–44)
AST: 89 U/L — ABNORMAL HIGH (ref 15–41)
Albumin: 3.2 g/dL — ABNORMAL LOW (ref 3.5–5.0)
Alkaline Phosphatase: 345 U/L — ABNORMAL HIGH (ref 38–126)
Anion gap: 7 (ref 5–15)
BUN: 17 mg/dL (ref 8–23)
CO2: 24 mmol/L (ref 22–32)
Calcium: 8.8 mg/dL — ABNORMAL LOW (ref 8.9–10.3)
Chloride: 107 mmol/L (ref 98–111)
Creatinine, Ser: 0.98 mg/dL (ref 0.61–1.24)
GFR, Estimated: >60 mL/min (ref >60)
Glucose, Bld: 91 mg/dL (ref 70–99)
Potassium: 5.3 mmol/L — ABNORMAL HIGH (ref 3.5–5.1)
Sodium: 138 mmol/L (ref 135–145)
Total Bilirubin: 3.9 mg/dL — ABNORMAL HIGH (ref 0.3–1.2)
Total Protein: 5.1 g/dL — ABNORMAL LOW (ref 6.5–8.1)

## 2021-06-12 LAB — LIPASE, BLOOD: Lipase: 31 U/L (ref 11–51)

## 2021-06-12 MED ORDER — TAMSULOSIN HCL 0.4 MG PO CAPS
0.4000 mg | ORAL_CAPSULE | Freq: Every day | ORAL | Status: DC
  Administered 2021-06-13 – 2021-06-14 (×2): 0.4 mg via ORAL
  Filled 2021-06-12 (×2): qty 1

## 2021-06-12 MED ORDER — GADOBUTROL 1 MMOL/ML IV SOLN
6.0000 mL | Freq: Once | INTRAVENOUS | Status: AC | PRN
  Administered 2021-06-12: 6 mL via INTRAVENOUS

## 2021-06-12 MED ORDER — IOPAMIDOL (ISOVUE-300) INJECTION 61%
80.0000 mL | Freq: Once | INTRAVENOUS | Status: AC | PRN
  Administered 2021-06-12: 80 mL via INTRAVENOUS

## 2021-06-12 MED ORDER — ACETAMINOPHEN 650 MG RE SUPP: 650.0000 mg | Freq: Four times a day (QID) | RECTAL | Status: DC | PRN

## 2021-06-12 MED ORDER — ONDANSETRON HCL 4 MG PO TABS: 4.0000 mg | ORAL_TABLET | Freq: Four times a day (QID) | ORAL | Status: DC | PRN

## 2021-06-12 MED ORDER — SODIUM CHLORIDE 0.9 % IV BOLUS
500.0000 mL | Freq: Once | INTRAVENOUS | Status: AC
  Administered 2021-06-12: 500 mL via INTRAVENOUS

## 2021-06-12 MED ORDER — ACETAMINOPHEN 325 MG PO TABS
650.0000 mg | ORAL_TABLET | Freq: Four times a day (QID) | ORAL | Status: DC | PRN
  Administered 2021-06-13: 650 mg via ORAL
  Filled 2021-06-12 (×2): qty 2

## 2021-06-12 MED ORDER — SODIUM CHLORIDE 0.9 % IV SOLN: INTRAVENOUS | Status: AC

## 2021-06-12 MED ORDER — SODIUM CHLORIDE 0.9 % IV SOLN
2.0000 g | INTRAVENOUS | Status: DC
  Administered 2021-06-12 – 2021-06-13 (×2): 2 g via INTRAVENOUS
  Filled 2021-06-12 (×2): qty 20

## 2021-06-12 MED ORDER — METRONIDAZOLE 500 MG/100ML IV SOLN
500.0000 mg | Freq: Two times a day (BID) | INTRAVENOUS | Status: DC
  Administered 2021-06-13 – 2021-06-14 (×4): 500 mg via INTRAVENOUS
  Filled 2021-06-12 (×4): qty 100

## 2021-06-12 MED ORDER — ONDANSETRON HCL 4 MG/2ML IJ SOLN: 4.0000 mg | Freq: Four times a day (QID) | INTRAMUSCULAR | Status: DC | PRN

## 2021-06-12 NOTE — Assessment & Plan Note (Signed)
Stable. On flomax 0.4 mg daily at home. Will hold until decision made regarding ERCP in AM.

## 2021-06-12 NOTE — Subjective & Objective (Addendum)
CC: elevated LFTs, concern for ascending cholangitis HPI: 88 year old male history of hypertension, hyperlipidemia, coronary disease, history of choledocholithiasis even after laparoscopic cholecystectomy presents to the ER from home.  Patient been seen in the family practice office on 06/10/2021 and had LFTs drawn due to family wanting to start prescription medications for toenail fungus.   Patient was reported to have severely elevated LFTs with concern for possible cholangitis.  CT scan demonstrated cholangitis.  GI sent the patient over for evaluation to the ER.  Patient is extremely hard of hearing according to daughter.  Daughter [name] has noticed the patient being slightly jaundice in the eyes.  On arrival to ED temp 98.1 heart rate 88 blood pressure 105/55 satting 98% on room air.  Labs: White count 15.2 hemoglobin 14.5 platelets 64  Sodium 138 potassium 5.3 BUN of 17 creatinine 0.9 AST 89, ALT 135, alk phos at 345, total bili 3.9  CT abdomen pelvis shows hyperenhancement of the wall of the common bile duct.  There is no dilatation of the intrahepatic bile ducts.  [facility] hospitalist contacted for admission.

## 2021-06-12 NOTE — Assessment & Plan Note (Signed)
Verified with dtr/HCPOA karen brady that pt is a DNR/DNI.

## 2021-06-12 NOTE — Assessment & Plan Note (Signed)
Stable

## 2021-06-12 NOTE — Assessment & Plan Note (Signed)
Stable. Prn IV labetalol. Holding PO HTN meds due to NPO status.

## 2021-06-12 NOTE — ED Provider Triage Note (Signed)
Emergency Medicine Provider Triage Evaluation Note  [name] , a 88 y.o. male  was evaluated in triage.  Pt complains of being sent in by PCP for elevated liver enzymes, and concern for cholangitis on CT scan.  Patient recently started taking terbinafine for onychomycosis.  Patient reports that he had 1 episode of feeling slightly feverish last night.  Has otherwise felt increasingly weak.  He has not had any nausea, vomiting, diarrhea, dysuria.  He does not have a gallbladder or appendix.  Review of Systems  Positive: Weakness, chills Negative: Nausea, vomiting, diarrhea, abdominal pain  Physical Exam  BP (!) 105/55   Pulse 88   Temp 98.1 F (36.7 C) (Oral)   Resp 16   SpO2 98%  Gen:   Awake, no distress   Resp:  Normal effort  MSK:   Moves extremities without difficulty  Other:  Some tenderness to palpation in the right upper quadrant without rebound, rigidity, guarding.  Medical Decision Making  Medically screening exam initiated at 7:11 PM.  Appropriate orders placed.  [name] was informed that the remainder of the evaluation will be completed by another provider, this initial triage assessment does not replace that evaluation, and the importance of remaining in the ED until their evaluation is complete.  Patient was sent over for elevated liver enzymes, concern for cholangitis.  He reports he is only been taking terbinafine for a few days at most.  No fever on arrival in triage.   [name], [place] 06/12/21 1928

## 2021-06-12 NOTE — ED Provider Notes (Cosign Needed Addendum)
[facility] EMERGENCY DEPARTMENT Provider Note   CSN: [id number] Arrival date & time: 06/12/21  1853     History  Chief Complaint  Patient presents with   Abnormal Lab    [name] is a 88 y.o. male.  HPI Patient is an 88 year old male with a history of hyperlipidemia, hypertension, choledocholithiasis with obstruction status postcholecystectomy, GERD, who presents to the emergency department due to weakness.  Patient states that he initially was being seen by his PCP due to onychomycosis.  Before starting a medication they were checking his basic lab work and noted elevated LFTs.  He was then referred for an outpatient CT scan which was obtained today with findings as noted below:  IMPRESSION: 1. Hyperenhancement of the wall of the common bile duct, concerning for cholangitis. 2. Diverticulosis with no diverticulitis. 3. Aortic Atherosclerosis (ICD10-I70.0).  Patient states that he has become progressively weak.  Has difficulty standing and ambulating.  Also notes that he has some very mild abdominal pain on the right side that developed recently.  No chest pain, shortness of breath, nausea, vomiting, diarrhea.    Home Medications Prior to Admission medications   Medication Sig Start Date End Date Taking? Authorizing Provider  acetaminophen (TYLENOL) 325 MG tablet Take 650 mg by mouth every 6 (six) hours as needed for mild pain.    [provider]  Artificial Tear Solution (TEARS NATURALE OP) Place 1-2 drops into both eyes at bedtime as needed (dry eyes).    [provider]  Ensure (ENSURE) Take 237 mLs by mouth.    [provider]  sertraline (ZOLOFT) 25 MG tablet Take 1 tablet (25 mg total) by mouth daily. For depression Patient taking differently: Take 50 mg by mouth daily. For depression 05/06/21   Pleas Koch, NP  tamsulosin (FLOMAX) 0.4 MG CAPS capsule Take 1 capsule (0.4 mg total) by mouth daily. For urine flow  06/05/21   Pleas Koch, NP  traZODone (DESYREL) 50 MG tablet Take 0.5-1 tablets (25-50 mg total) by mouth at bedtime. For sleep. 06/10/21   Pleas Koch, NP      Allergies    Penicillins    Review of Systems   Review of Systems  All other systems reviewed and are negative. Ten systems reviewed and are negative for acute change, except as noted in the HPI.   Physical Exam Updated Vital Signs BP 115/66   Pulse 80   Temp 98.1 F (36.7 C) (Oral)   Resp (!) 25   Ht 5' (1.524 m)   Wt 60.3 kg   SpO2 97%   BMI 25.97 kg/m  Physical Exam Vitals and nursing note reviewed.  Constitutional:      General: He is not in acute distress.    Appearance: Normal appearance. He is not ill-appearing, toxic-appearing or diaphoretic.  HENT:     Head: Normocephalic and atraumatic.     Right Ear: External ear normal.     Left Ear: External ear normal.     Nose: Nose normal.     Mouth/Throat:     Mouth: Mucous membranes are moist.     Pharynx: Oropharynx is clear. No oropharyngeal exudate or posterior oropharyngeal erythema.  Eyes:     General:        Right eye: No discharge.        Left eye: No discharge.     Extraocular Movements: Extraocular movements intact.  Cardiovascular:     Rate and Rhythm: Normal  rate and regular rhythm.     Pulses: Normal pulses.     Heart sounds: Normal heart sounds. No murmur heard.   No friction rub. No gallop.  Pulmonary:     Effort: Pulmonary effort is normal. No respiratory distress.     Breath sounds: Normal breath sounds. No stridor. No wheezing, rhonchi or rales.  Abdominal:     General: Abdomen is flat.     Palpations: Abdomen is soft.     Tenderness: There is abdominal tenderness.     Comments: Protuberant abdomen that is soft.  Mild tenderness appreciated along the right central lateral abdomen as well as the right upper quadrant.  Musculoskeletal:        General: Normal range of motion.     Cervical back: Normal range of motion and neck  supple. No tenderness.  Skin:    General: Skin is warm and dry.  Neurological:     General: No focal deficit present.     Mental Status: He is alert and oriented to person, place, and time.  Psychiatric:        Mood and Affect: Mood normal.        Behavior: Behavior normal.   ED Results / Procedures / Treatments   Labs (all labs ordered are listed, but only abnormal results are displayed) Labs Reviewed  COMPREHENSIVE METABOLIC PANEL - Abnormal; Notable for the following components:      Result Value   Potassium 5.3 (*)    Calcium 8.8 (*)    Total Protein 5.1 (*)    Albumin 3.2 (*)    AST 89 (*)    ALT 137 (*)    Alkaline Phosphatase 345 (*)    Total Bilirubin 3.9 (*)    All other components within normal limits  CBC - Abnormal; Notable for the following components:   WBC 15.2 (*)    RDW 15.9 (*)    Platelets 64 (*)    All other components within normal limits  URINALYSIS, ROUTINE W REFLEX MICROSCOPIC - Abnormal; Notable for the following components:   Color, Urine AMBER (*)    Specific Gravity, Urine >1.046 (*)    Leukocytes,Ua TRACE (*)    All other components within normal limits  CULTURE, BLOOD (ROUTINE X 2)  CULTURE, BLOOD (ROUTINE X 2)  LIPASE, BLOOD    EKG None  Radiology CT ABDOMEN PELVIS W CONTRAST  Result Date: 06/12/2021 CLINICAL DATA:  Elevated LFTs and bilirubin, abdominal pain EXAM: CT ABDOMEN AND PELVIS WITH CONTRAST TECHNIQUE: Multidetector CT imaging of the abdomen and pelvis was performed using the standard protocol following bolus administration of intravenous contrast. RADIATION DOSE REDUCTION: This exam was performed according to the departmental dose-optimization program which includes automated exposure control, adjustment of the mA and/or kV according to patient size and/or use of iterative reconstruction technique. CONTRAST:  22m ISOVUE-300 IOPAMIDOL (ISOVUE-300) INJECTION 61% COMPARISON:  CT abdomen and pelvis dated September 10, 2019 FINDINGS:  Lower chest: Bibasilar atelectasis. Coronary artery calcifications and mitral annular calcifications. Enlarged periaortic lymph node measuring 8 mm in short axis on series 2, image 14, unchanged compared to prior exam. Hepatobiliary: No suspicious liver lesions. Gallbladder is surgically absent. Prominence of the common bile ducts not unexpected status post cholecystectomy. Hyperenhancement of the wall of the common bile duct. No dilation of the intrahepatic bile ducts. Pancreas: Unremarkable. No pancreatic ductal dilatation or surrounding inflammatory changes. Spleen: Normal in size without focal abnormality. Adrenals/Urinary Tract: Bilateral adrenal glands are unremarkable. No hydronephrosis or  nephrolithiasis. Bilateral simple appearing parapelvic cysts, no further follow-up imaging is recommended for these lesions. Bladder is unremarkable. Stomach/Bowel: Small hiatal hernia. Stomach is otherwise unremarkable. Normal appendix. Severe sigmoid diverticulosis. No bowel wall thickening, inflammatory change or evidence of obstruction. Vascular/Lymphatic: Atherosclerotic disease of the abdominal aorta. Calcified splenic artery aneurysm measuring 1.4 cm on series 2, image 23, unchanged when compared to prior exam. Reproductive: Prostatomegaly. Other: Small fat containing left inguinal hernia. No abdominopelvic ascites. Musculoskeletal: Prior right hip arthroplasty. Multilevel degenerative disc disease of the thoracolumbar spine. No aggressive appearing osseous lesions. IMPRESSION: 1. Hyperenhancement of the wall of the common bile duct, concerning for cholangitis. 2. Diverticulosis with no diverticulitis. 3. Aortic Atherosclerosis (ICD10-I70.0). These results will be called to the ordering clinician or representative by the Radiologist Assistant, and communication documented in the PACS or [employer]. Electronically Signed   By: Yetta Glassman M.D.   On: 06/12/2021 10:32    Procedures Procedures    Medications Ordered in ED Medications  cefTRIAXone (ROCEPHIN) 2 g in sodium chloride 0.9 % 100 mL IVPB (has no administration in time range)  metroNIDAZOLE (FLAGYL) IVPB 500 mg (has no administration in time range)  sodium chloride 0.9 % bolus 500 mL (has no administration in time range)    ED Course/ Medical Decision Making/ A&P Clinical Course as of 06/12/21 2159  Fri Jun 12, 2021  2108 Patient discussed with Dr. Benson Norway with gastroenterology.  Recommends overnight admission and MRCP in the morning.  Recommend starting patient on either ciprofloxacin or Unasyn if patient needs broader coverage.  GI will evaluate the patient in the morning. [LJ]  [address], [facility] notified me that patient's allergy will likely not allow him to tolerate Unasyn.  Recommends instead patient be given Rocephin/Flagyl instead of Unasyn or Cipro.  This has been ordered. [LJ]    Clinical Course User Index [LJ] Rayna Sexton, PA-C                           Medical Decision Making Amount and/or Complexity of Data Reviewed Radiology: ordered.  Risk Prescription drug management. Decision regarding hospitalization.  Pt is a 88 y.o. male who presents to the emergency department due to weakness and abdominal pain.  Labs: CBC with a white count of 15.2, RDW of 15.9, platelets of 64. CMP with a potassium of 5.3, calcium of 8.8, total protein 5.1, albumin of 3.2, AST of 89, ALT of 137, alk phos of 345, total bilirubin of 3.9. Lipase of 31. UA with trace hemoglobin, elevated specific gravity.  Imaging: CT scan of the abdomen/pelvis with contrast which was obtained outpatient earlier today shows IMPRESSION: 1. Hyperenhancement of the wall of the common bile duct, concerning for cholangitis. 2. Diverticulosis with no diverticulitis. 3. Aortic Atherosclerosis.  I, Rayna Sexton, PA-C, personally reviewed and evaluated these images and lab results as part of my medical decision-making.  On my assessment heart is  regular rate and rhythm without murmurs, rubs, or gallops.  Lungs are clear to auscultation bilaterally.  Abdomen is protuberant and soft.  He has mild tenderness along the right central lateral abdomen and right upper quadrant.  Patient CT scan obtained earlier today outpatient is concerning for possible cholangitis.  Patient does have a leukocytosis of 15.2 but is otherwise afebrile and nontachycardic.  Nontoxic-appearing.  Patient discussed with Dr. Benson Norway who is on-call with gastroenterology.  Recommends MRCP and starting patient on antibiotics overnight.  Admission to medicine.  Gastroenterology will evaluate  the patient in the morning.  Pharmacy team recommends patient be started on Rocephin as well as Flagyl.  This has been ordered.  Discussed possible admission with the patient as well as his daughters at bedside.  They are agreeable with this plan.  Patient discussed with Dr. Bridgett Larsson with the medicine team who will accept the patient.  Note: Portions of this report may have been transcribed using voice recognition software. Every effort was made to ensure accuracy; however, inadvertent computerized transcription errors may be present.   Final Clinical Impression(s) / ED Diagnoses Final diagnoses:  Cholangitis   Rx / DC Orders ED Discharge Orders     None         Rayna Sexton, PA-C 06/12/21 2158    Rayna Sexton, PA-C 06/12/21 2159    Sherwood Gambler, MD 06/13/21 [phone]

## 2021-06-12 NOTE — ED Notes (Signed)
Pt remains in MRI 

## 2021-06-12 NOTE — ED Notes (Signed)
Lemmie Evens health care power of attorney would like a call with updates. [phone]

## 2021-06-12 NOTE — Assessment & Plan Note (Addendum)
Observation medical bed. Ascending cholangitis is a Acute illness/condition that poses a threat to life or bodily function. Continue with Rocephin 2 g qday and flagyl 500 mg q12h. GI has been consulted by EDP. Awaiting results of MRCP. Pt has had denovo CBD stone formation in the past(2015) required ERCP. Blood cx ordered by ED Keep NPO in case he needs ERCP in AM. Gentle IV NS 75 ml/hr overnight.

## 2021-06-12 NOTE — Telephone Encounter (Signed)
Call received from Radiology for call report on CT for [name]. I have sent teams as well as sending this message to Anda Kraft to let her know that report in chart.

## 2021-06-12 NOTE — ED Notes (Signed)
Admin provider at bedside

## 2021-06-12 NOTE — Telephone Encounter (Signed)
Patty I reviewed his labs and CT scan findings.  He needs to be directed to the nearest hospital for likely admission.  This is not appropriate for an outpatient work-up.  He is jaundiced with a bilirubin of 9.2 and elevated liver enzymes which are new, and hyperenhancement of the bile duct on CT scan.  He needs repeat labs in the ED and imaging likely MRCP, with possible ERCP pending findings.  Can you please contact him as we discussed and send him to [facility].  Thanks

## 2021-06-12 NOTE — Telephone Encounter (Signed)
Received urgent referral from Alma Friendly, NP for asymptomatic cholangitis (found on CT scan) with significantly elevated LFT's. Former Dr. Olevia Perches pt. Please advise scheduling urgent appt. Dr. Havery Moros DOD this afternoon. Thanks

## 2021-06-12 NOTE — Telephone Encounter (Signed)
Per VO from Dr Havery Moros the pt should go to the ED for possible admission due to elevated labs.   The pt was called and he has been advised to go to the closest ED for eval and admission.  He agrees and states he will go now.    Stephannie Peters

## 2021-06-12 NOTE — Assessment & Plan Note (Signed)
Chronic. 

## 2021-06-12 NOTE — H&P (Signed)
History and Physical    [name] PVV:[id number] DOB: 10/16/1933 DOA: 06/12/2021  DOS: the patient was seen and examined on 06/12/2021  PCP: Pleas Koch, NP   Patient coming from: Home  I have personally briefly reviewed patient's old medical records in [place]  CC: elevated LFTs, concern for ascending cholangitis HPI: 88 year old male history of hypertension, hyperlipidemia, coronary disease, history of choledocholithiasis even after laparoscopic cholecystectomy presents to the ER from home.  Patient been seen in the family practice office on 06/10/2021 and had LFTs drawn due to family wanting to start prescription medications for toenail fungus.   Patient was reported to have severely elevated LFTs with concern for possible cholangitis.  CT scan demonstrated cholangitis.  GI sent the patient over for evaluation to the ER.  Patient is extremely hard of hearing according to daughter.  Daughter [name] has noticed the patient being slightly jaundice in the eyes.  On arrival to ED temp 98.1 heart rate 88 blood pressure 105/55 satting 98% on room air.  Labs: White count 15.2 hemoglobin 14.5 platelets 64  Sodium 138 potassium 5.3 BUN of 17 creatinine 0.9 AST 89, ALT 135, alk phos at 345, total bili 3.9  CT abdomen pelvis shows hyperenhancement of the wall of the common bile duct.  There is no dilatation of the intrahepatic bile ducts.  [facility] contacted for admission.   ED Course: Elevated LFTs, leukocytosis noted.  Started on IV Rocephin and Flagyl.  MRCP ordered.  Blood cultures ordered.  Review of Systems:  Review of Systems  Constitutional:  Positive for malaise/fatigue. Negative for chills and fever.  HENT: Negative.    Eyes: Negative.   Respiratory: Negative.    Cardiovascular: Negative.   Gastrointestinal: Negative.   Genitourinary: Negative.   Musculoskeletal: Negative.   Skin: Negative.        Toenail fungus.  Neurological:         Daughter reports patient being off balance.  Endo/Heme/Allergies: Negative.   Psychiatric/Behavioral:  Positive for depression.   All other systems reviewed and are negative.  Past Medical History:  Diagnosis Date   Arthralgia of multiple joints    limited mobility w/  independant adl's   BPH with obstruction/lower urinary tract symptoms    Chronic idiopathic monocytosis    followed by dr Waymon Budge--  per lov note 06/ 2017 persistant unexplained  with normal bone barrow bx   Coronary atherosclerosis of unspecified type of vessel, native or graft    cardiologist-  dr Stanford Breed -- per lov note 11-19-2014 ,  cardiac cath in 2000-- pLAD 60-70%,  small intermediate branch 70-80%,  mRCA 20%,  normal LV   Degenerative arthritis of spine    cervical and lumar   Diverticulosis of colon (without mention of hemorrhage)    Dysuria    chronic   Elevated PSA    Feeling of incomplete bladder emptying    Hiatal hernia    moderate per ct 11/ 2017   History of adenomatous polyp of colon    2008   History of atrial fibrillation    remote hx episode   History of COVID-19 03/05/2020   History of pancreatitis    07-01-2013  and 10-10-2015   History of prostatitis    2014   Hx of colonic polyps    Hyperlipidemia    Hypertension    Mouth sore    roof of mouth sore    Nephrolithiasis    Nocturia more than twice per night  severe  w/ leakage   OSA (obstructive sleep apnea)    INTOLERANT CPAP   Osteoarthritis    Pernicious anemia    B12  Def.   Peyronie disease    Renal insufficiency    Sepsis ([place]) 03/22/2017   Thrombocytopenia, unspecified ([place]) hemotology/oncologist-  dr Waymon Budge   per dr Velta Addison note 06/ 2016  secondary to vitro clumping   Vitamin B deficiency     Past Surgical History:  Procedure Laterality Date   BONE MARROW BIOPSY  2011   normal   CARDIAC CATHETERIZATION  2000   per dr Kathyrn Drown note --  dLAD 60-70%,  small intermediate branch 70-80%,  mRCA  20%,  normal LV   CARDIOVASCULAR STRESS TEST  01-29-2013   dr Stanford Breed   normal nuclear study w/ no ischemia,  normal LV function and wall motion , ef 82%   CATARACT EXTRACTION W/ INTRAOCULAR LENS  IMPLANT, BILATERAL  08/2015   CHOLECYSTECTOMY  11/24/2010   Procedure: LAPAROSCOPIC CHOLECYSTECTOMY WITH INTRAOPERATIVE CHOLANGIOGRAM;  Surgeon: Earnstine Regal, MD;  Location: [facility];  Service: General;  Laterality: N/A;  c-arm   CYSTOSCOPY W/ URETERAL STENT PLACEMENT Left 12/17/2015   Procedure: CYSTOSCOPY WITH RETROGRADE PYELOGRAM/URETERAL STENT PLACEMENT;  Surgeon: Ardis Hughs, MD;  Location: [facility];  Service: Urology;  Laterality: Left;   CYSTOSCOPY WITH RETROGRADE PYELOGRAM, URETEROSCOPY AND STENT PLACEMENT Left 12/22/2015   Procedure: CYSTOSCOPY WITH LEFT RETROGRADE  URETEROSCOPY AND STENT PLACEMENT;  Surgeon: Carolan Clines, MD;  Location: [facility];  Service: Urology;  Laterality: Left;   ERCP N/A 07/04/2013   Procedure: ENDOSCOPIC RETROGRADE CHOLANGIOPANCREATOGRAPHY (ERCP);  Surgeon: Gatha Mayer, MD;  Location: [facility];  Service: Endoscopy;  Laterality: N/A;  MAC if available   ERCP N/A 10/12/2015   Procedure: ENDOSCOPIC RETROGRADE CHOLANGIOPANCREATOGRAPHY (ERCP);  Surgeon: [name], MD;  Location: [facility];  Service: Endoscopy;  Laterality: N/A;   HOLMIUM LASER APPLICATION Left 25/03/6642   Procedure: HOLMIUM LASER APPLICATION;  Surgeon: Carolan Clines, MD;  Location: [facility];  Service: Urology;  Laterality: Left;   INGUINAL HERNIA REPAIR Right 01/25/2002   KNEE ARTHROSCOPY  x5   SATURATION BIOPSY OF PROSTATE  05-29-2007  and 01-26-2008   SHOULDER ARTHROSCOPY WITH OPEN ROTATOR CUFF REPAIR AND DISTAL CLAVICLE ACROMINECTOMY Right 11/19/2004   TOTAL HIP ARTHROPLASTY Right 05/21/2009   TOTAL KNEE ARTHROPLASTY Bilateral left 05-02-2003/  right  03-23-2010   TRANSTHORACIC ECHOCARDIOGRAM  12/23/2004   ef 60%, mild MV calcification without  stenosis/  mild TR,  PASP 33mHg   UVULOPALATOPHARYNGOPLASTY  1993    w/  T & A     reports that he quit smoking about 55 years ago. His smoking use included pipe. He has never used smokeless tobacco. He reports that he does not drink alcohol and does not use drugs.  Allergies  Allergen Reactions   Penicillins Hives and Other (See Comments)    [place]ps, passed out Tolerates cephalosporins  Has patient had a PCN reaction causing immediate rash, facial/tongue/throat swelling, SOB or lightheadedness with hypotension:  yes Has patient had a PCN reaction causing severe rash involving mucus membranes or skin necrosis: no Has patient had a PCN reaction that required hospitalization: no Has patient had a PCN reaction occurring within the last 10 years: no If all of the above answers are "NO", then may proceed with Cephalosporin use.    Tape Hives    PAPER TAPE    Family History  Problem Relation  Age of Onset   Colon cancer Father 64   Peripheral vascular disease Mother     Prior to Admission medications   Medication Sig Start Date End Date Taking? Authorizing Provider  acetaminophen (TYLENOL) 325 MG tablet Take 650 mg by mouth every 6 (six) hours as needed for mild pain or headache.   Yes [provider]  Artificial Tear Solution (TEARS NATURALE OP) Place 1-2 drops into both eyes at bedtime as needed (dry eyes).    [provider]  Ensure (ENSURE) Take 237 mLs by mouth. Chocolate    [provider]  sertraline (ZOLOFT) 25 MG tablet Take 1 tablet (25 mg total) by mouth daily. For depression Patient taking differently: Take 50 mg by mouth daily. For depression 05/06/21   Pleas Koch, NP  tamsulosin (FLOMAX) 0.4 MG CAPS capsule Take 1 capsule (0.4 mg total) by mouth daily. For urine flow 06/05/21   Pleas Koch, NP  traZODone (DESYREL) 50 MG tablet Take 0.5-1 tablets (25-50 mg total) by mouth at bedtime. For sleep. Patient taking differently: Take 25-50  mg by mouth at bedtime. 06/10/21   Pleas Koch, NP    Physical Exam: Vitals:   06/12/21 2045 06/12/21 2111 06/12/21 2130 06/12/21 2145  BP: 133/61  115/66 (!) 114/59  Pulse: 88  80 (!) 40  Resp: (!) 24  (!) 25 16  Temp:      TempSrc:      SpO2: 97%  97% 96%  Weight:  60.3 kg    Height:  5' (1.524 m)      Physical Exam Vitals and nursing note reviewed.  Constitutional:      General: He is not in acute distress.    Appearance: Normal appearance. He is not ill-appearing, toxic-appearing or diaphoretic.  HENT:     Head: Normocephalic and atraumatic.     Nose: Nose normal.  Eyes:     General: Scleral icterus present.  Cardiovascular:     Rate and Rhythm: Normal rate and regular rhythm.     Heart sounds: Murmur heard.     Comments: Musical systolic/diastolic murmur LUSB Pulmonary:     Effort: Pulmonary effort is normal. No respiratory distress.     Breath sounds: No wheezing or rales.  Abdominal:     General: Bowel sounds are normal. There is no distension.     Tenderness: There is no abdominal tenderness. There is no guarding or rebound.     Comments: No RUQ tenderness. No epigastric tenderness  Musculoskeletal:     Right lower leg: No edema.     Left lower leg: No edema.  Skin:    General: Skin is warm and dry.     Capillary Refill: Capillary refill takes less than 2 seconds.  Neurological:     Mental Status: He is alert and oriented to person, place, and time.     Comments: Hard of hearing     Labs on Admission: I have personally reviewed following labs and imaging studies  CBC: Recent Labs  Lab 06/12/21 1921  WBC 15.2*  HGB 14.5  HCT 44.3  MCV 83.9  PLT 64*   Basic Metabolic Panel: Recent Labs  Lab 06/10/21 1547 06/12/21 1921  NA 136 138  K 4.8 5.3*  CL 105 107  CO2 24 24  GLUCOSE 154* 91  BUN 15 17  CREATININE 1.49 0.98  CALCIUM 9.2 8.8*   GFR: Estimated Creatinine Clearance: 42.2 mL/min (by C-G formula based on SCr of 0.98  mg/dL). Liver Function Tests: Recent Labs  Lab 06/10/21 1547 06/12/21 1921  AST 174* 89*  ALT 236* 137*  ALKPHOS 473* 345*  BILITOT 9.2* 3.9*  PROT 5.5* 5.1*  ALBUMIN 3.8 3.2*   Recent Labs  Lab 06/12/21 1921  LIPASE 31   No results for input(s): AMMONIA in the last 168 hours. Coagulation Profile: No results for input(s): INR, PROTIME in the last 168 hours. Cardiac Enzymes: No results for input(s): CKTOTAL, CKMB, CKMBINDEX, TROPONINI, TROPONINIHS in the last 168 hours. BNP (last 3 results) No results for input(s): PROBNP in the last 8760 hours. HbA1C: No results for input(s): HGBA1C in the last 72 hours. CBG: No results for input(s): GLUCAP in the last 168 hours. Lipid Profile: No results for input(s): CHOL, HDL, LDLCALC, TRIG, CHOLHDL, LDLDIRECT in the last 72 hours. Thyroid Function Tests: No results for input(s): TSH, T4TOTAL, FREET4, T3FREE, THYROIDAB in the last 72 hours. Anemia Panel: No results for input(s): VITAMINB12, FOLATE, FERRITIN, TIBC, IRON, RETICCTPCT in the last 72 hours. Urine analysis:    Component Value Date/Time   COLORURINE AMBER (A) 06/12/2021 1912   APPEARANCEUR CLEAR 06/12/2021 1912   LABSPEC >1.046 (H) 06/12/2021 1912   PHURINE 5.0 06/12/2021 1912   GLUCOSEU NEGATIVE 06/12/2021 1912   HGBUR NEGATIVE 06/12/2021 1912   HGBUR negative 06/02/2009 1059   [place] 06/12/2021 1912   BILIRUBINUR 2+ 07/18/2019 [place] 06/12/2021 1912   PROTEINUR NEGATIVE 06/12/2021 1912   UROBILINOGEN 1.0 07/18/2019 0840   UROBILINOGEN 1.0 07/01/2013 0450   NITRITE NEGATIVE 06/12/2021 1912   LEUKOCYTESUR TRACE (A) 06/12/2021 1912    Radiological Exams on Admission: I have personally reviewed images CT ABDOMEN PELVIS W CONTRAST  Result Date: 06/12/2021 CLINICAL DATA:  Elevated LFTs and bilirubin, abdominal pain EXAM: CT ABDOMEN AND PELVIS WITH CONTRAST TECHNIQUE: Multidetector CT imaging of the abdomen and pelvis was performed  using the standard protocol following bolus administration of intravenous contrast. RADIATION DOSE REDUCTION: This exam was performed according to the departmental dose-optimization program which includes automated exposure control, adjustment of the mA and/or kV according to patient size and/or use of iterative reconstruction technique. CONTRAST:  54m ISOVUE-300 IOPAMIDOL (ISOVUE-300) INJECTION 61% COMPARISON:  CT abdomen and pelvis dated September 10, 2019 FINDINGS: Lower chest: Bibasilar atelectasis. Coronary artery calcifications and mitral annular calcifications. Enlarged periaortic lymph node measuring 8 mm in short axis on series 2, image 14, unchanged compared to prior exam. Hepatobiliary: No suspicious liver lesions. Gallbladder is surgically absent. Prominence of the common bile ducts not unexpected status post cholecystectomy. Hyperenhancement of the wall of the common bile duct. No dilation of the intrahepatic bile ducts. Pancreas: Unremarkable. No pancreatic ductal dilatation or surrounding inflammatory changes. Spleen: Normal in size without focal abnormality. Adrenals/Urinary Tract: Bilateral adrenal glands are unremarkable. No hydronephrosis or nephrolithiasis. Bilateral simple appearing parapelvic cysts, no further follow-up imaging is recommended for these lesions. Bladder is unremarkable. Stomach/Bowel: Small hiatal hernia. Stomach is otherwise unremarkable. Normal appendix. Severe sigmoid diverticulosis. No bowel wall thickening, inflammatory change or evidence of obstruction. Vascular/Lymphatic: Atherosclerotic disease of the abdominal aorta. Calcified splenic artery aneurysm measuring 1.4 cm on series 2, image 23, unchanged when compared to prior exam. Reproductive: Prostatomegaly. Other: Small fat containing left inguinal hernia. No abdominopelvic ascites. Musculoskeletal: Prior right hip arthroplasty. Multilevel degenerative disc disease of the thoracolumbar spine. No aggressive appearing  osseous lesions. IMPRESSION: 1. Hyperenhancement of the wall of the common bile duct, concerning for cholangitis. 2. Diverticulosis with no diverticulitis. 3. Aortic  Atherosclerosis (ICD10-I70.0). These results will be called to the ordering clinician or representative by the Radiologist Assistant, and communication documented in the PACS or [employer]. Electronically Signed   By: Yetta Glassman M.D.   On: 06/12/2021 10:32    EKG: My personal interpretation of EKG shows: Normal sinus rhythm, bifascicular block    Assessment/Plan Principal Problem:   Ascending cholangitis Active Problems:   HLD (hyperlipidemia)   Essential hypertension   Coronary atherosclerosis   DNR (do not resuscitate)   Thrombocytopenia (HCC)   BPH (benign prostatic hyperplasia)   Chronic shoulder pain    Assessment and Plan: * Ascending cholangitis Observation medical bed. Ascending cholangitis is a Acute illness/condition that poses a threat to life or bodily function. Continue with Rocephin 2 g qday and flagyl 500 mg q12h. GI has been consulted by EDP. Awaiting results of MRCP. Pt has had denovo CBD stone formation in the past(2015) required ERCP. Blood cx ordered by ED Keep NPO in case he needs ERCP in AM. Gentle IV NS 75 ml/hr overnight.   Chronic shoulder pain Chronic.  BPH (benign prostatic hyperplasia) Stable. On flomax 0.4 mg daily at home. Will hold until decision made regarding ERCP in AM.  Thrombocytopenia (HCC) Chronic.  Baseline platelet count 50K-99K.  SCDs for prophylaxis.  Hold any chemical DVT prophylaxis.  DNR (do not resuscitate) Verified with dtr/HCPOA karen brady that pt is a DNR/DNI.  Coronary atherosclerosis Stable.  Essential hypertension Stable. Prn IV labetalol. Holding PO HTN meds due to NPO status.  HLD (hyperlipidemia) Stable. Hold statin due to elevated LFTs.   DVT prophylaxis: SCDs Code Status: DNR/DNI(Do NOT Intubate) verified with dtr karen brady Family  Communication: discussed with pt and dtr/hcpoa karen brady  Disposition Plan: return home  Consults called: EDP has consulted GI Dr. Benson Norway  Admission status: Observation, Med-Surg   Kristopher Oppenheim, DO [facility] 06/12/2021, 10:53 PM

## 2021-06-12 NOTE — Assessment & Plan Note (Signed)
Stable. Hold statin due to elevated LFTs. 

## 2021-06-12 NOTE — ED Notes (Signed)
Pt in mri at this time

## 2021-06-12 NOTE — Assessment & Plan Note (Signed)
Chronic.  Baseline platelet count 50K-99K.  SCDs for prophylaxis.  Hold any chemical DVT prophylaxis.

## 2021-06-12 NOTE — ED Notes (Signed)
Pt returned from MRI. Admit provider was at bedside, lab now at bedside

## 2021-06-12 NOTE — ED Notes (Signed)
Provider at bedside

## 2021-06-12 NOTE — Telephone Encounter (Addendum)
Noted and appreciate GI advice.   Patient originally came to my office requesting terbinafine for onychomycosis, no GI symptoms or jaundice.  I obtained CMP (routinely checked prior to initiation of such treatment) which revealed his significantly elevated LFTs and bilirubin.  I ordered the CT abdomen pelvis which revealed the cholangitis.  Collaborated with supervising physician who recommended stat GI referral.

## 2021-06-12 NOTE — ED Triage Notes (Signed)
Pt states he was sent to ED by PCP after having "five abnormal things". Pt states he had an outpatient CT scan today. Also states he has been not feeling well lately with no energy and fatigue. Pt states he has a hx of falls d/t inability to keep balance that started after getting hearing aids that has been ongoing over the last year.

## 2021-06-13 DIAGNOSIS — G8929 Other chronic pain: Secondary | ICD-10-CM | POA: Diagnosis present

## 2021-06-13 DIAGNOSIS — Z79899 Other long term (current) drug therapy: Secondary | ICD-10-CM | POA: Diagnosis not present

## 2021-06-13 DIAGNOSIS — M47812 Spondylosis without myelopathy or radiculopathy, cervical region: Secondary | ICD-10-CM | POA: Diagnosis present

## 2021-06-13 DIAGNOSIS — I10 Essential (primary) hypertension: Secondary | ICD-10-CM | POA: Diagnosis present

## 2021-06-13 DIAGNOSIS — Z86010 Personal history of colonic polyps: Secondary | ICD-10-CM | POA: Diagnosis not present

## 2021-06-13 DIAGNOSIS — R7401 Elevation of levels of liver transaminase levels: Secondary | ICD-10-CM | POA: Diagnosis present

## 2021-06-13 DIAGNOSIS — F419 Anxiety disorder, unspecified: Secondary | ICD-10-CM | POA: Diagnosis present

## 2021-06-13 DIAGNOSIS — N400 Enlarged prostate without lower urinary tract symptoms: Secondary | ICD-10-CM | POA: Diagnosis present

## 2021-06-13 DIAGNOSIS — B351 Tinea unguium: Secondary | ICD-10-CM | POA: Diagnosis present

## 2021-06-13 DIAGNOSIS — Z66 Do not resuscitate: Secondary | ICD-10-CM | POA: Diagnosis present

## 2021-06-13 DIAGNOSIS — K8309 Other cholangitis: Secondary | ICD-10-CM | POA: Diagnosis present

## 2021-06-13 DIAGNOSIS — M47816 Spondylosis without myelopathy or radiculopathy, lumbar region: Secondary | ICD-10-CM | POA: Diagnosis present

## 2021-06-13 DIAGNOSIS — G4700 Insomnia, unspecified: Secondary | ICD-10-CM | POA: Diagnosis not present

## 2021-06-13 DIAGNOSIS — R932 Abnormal findings on diagnostic imaging of liver and biliary tract: Secondary | ICD-10-CM | POA: Diagnosis present

## 2021-06-13 DIAGNOSIS — D696 Thrombocytopenia, unspecified: Secondary | ICD-10-CM | POA: Diagnosis present

## 2021-06-13 DIAGNOSIS — H9190 Unspecified hearing loss, unspecified ear: Secondary | ICD-10-CM | POA: Diagnosis present

## 2021-06-13 DIAGNOSIS — Z880 Allergy status to penicillin: Secondary | ICD-10-CM | POA: Diagnosis not present

## 2021-06-13 DIAGNOSIS — E876 Hypokalemia: Secondary | ICD-10-CM | POA: Diagnosis present

## 2021-06-13 DIAGNOSIS — I2510 Atherosclerotic heart disease of native coronary artery without angina pectoris: Secondary | ICD-10-CM | POA: Diagnosis present

## 2021-06-13 DIAGNOSIS — F32A Depression, unspecified: Secondary | ICD-10-CM | POA: Diagnosis present

## 2021-06-13 DIAGNOSIS — Z8616 Personal history of COVID-19: Secondary | ICD-10-CM | POA: Diagnosis not present

## 2021-06-13 DIAGNOSIS — K219 Gastro-esophageal reflux disease without esophagitis: Secondary | ICD-10-CM | POA: Diagnosis present

## 2021-06-13 DIAGNOSIS — M25519 Pain in unspecified shoulder: Secondary | ICD-10-CM | POA: Diagnosis present

## 2021-06-13 DIAGNOSIS — E785 Hyperlipidemia, unspecified: Secondary | ICD-10-CM | POA: Diagnosis present

## 2021-06-13 LAB — CBC WITH DIFFERENTIAL/PLATELET
Abs Immature Granulocytes: 0.94 10*3/uL — ABNORMAL HIGH (ref 0.00–0.07)
Basophils Absolute: 0.1 10*3/uL (ref 0.0–0.1)
Basophils Relative: 0 %
Eosinophils Absolute: 0 10*3/uL (ref 0.0–0.5)
Eosinophils Relative: 0 %
HCT: 39.3 % (ref 39.0–52.0)
Hemoglobin: 13 g/dL (ref 13.0–17.0)
Immature Granulocytes: 8 %
Lymphocytes Relative: 9 %
Lymphs Abs: 1.1 10*3/uL (ref 0.7–4.0)
MCH: 27.6 pg (ref 26.0–34.0)
MCHC: 33.1 g/dL (ref 30.0–36.0)
MCV: 83.4 fL (ref 80.0–100.0)
Monocytes Absolute: 2.1 10*3/uL — ABNORMAL HIGH (ref 0.1–1.0)
Monocytes Relative: 18 %
Neutro Abs: 7.9 10*3/uL — ABNORMAL HIGH (ref 1.7–7.7)
Neutrophils Relative %: 65 %
Platelets: 59 10*3/uL — ABNORMAL LOW (ref 150–400)
RBC: 4.71 MIL/uL (ref 4.22–5.81)
RDW: 15.8 % — ABNORMAL HIGH (ref 11.5–15.5)
Smear Review: DECREASED
WBC: 12.1 10*3/uL — ABNORMAL HIGH (ref 4.0–10.5)
nRBC: 0 % (ref 0.0–0.2)

## 2021-06-13 LAB — COMPREHENSIVE METABOLIC PANEL
ALT: 113 U/L — ABNORMAL HIGH (ref 0–44)
AST: 41 U/L (ref 15–41)
Albumin: 2.6 g/dL — ABNORMAL LOW (ref 3.5–5.0)
Alkaline Phosphatase: 284 U/L — ABNORMAL HIGH (ref 38–126)
Anion gap: 7 (ref 5–15)
BUN: 13 mg/dL (ref 8–23)
CO2: 23 mmol/L (ref 22–32)
Calcium: 8.1 mg/dL — ABNORMAL LOW (ref 8.9–10.3)
Chloride: 110 mmol/L (ref 98–111)
Creatinine, Ser: 0.82 mg/dL (ref 0.61–1.24)
GFR, Estimated: >60 mL/min (ref >60)
Glucose, Bld: 81 mg/dL (ref 70–99)
Potassium: 3.3 mmol/L — ABNORMAL LOW (ref 3.5–5.1)
Sodium: 140 mmol/L (ref 135–145)
Total Bilirubin: 2.5 mg/dL — ABNORMAL HIGH (ref 0.3–1.2)
Total Protein: 4.3 g/dL — ABNORMAL LOW (ref 6.5–8.1)

## 2021-06-13 LAB — PROCALCITONIN: Procalcitonin: 0.35 ng/mL

## 2021-06-13 LAB — MAGNESIUM: Magnesium: 2 mg/dL (ref 1.7–2.4)

## 2021-06-13 MED ORDER — SERTRALINE HCL 50 MG PO TABS
25.0000 mg | ORAL_TABLET | Freq: Every day | ORAL | Status: DC
  Administered 2021-06-13: 25 mg via ORAL
  Filled 2021-06-13: qty 1

## 2021-06-13 MED ORDER — SERTRALINE HCL 50 MG PO TABS
25.0000 mg | ORAL_TABLET | Freq: Once | ORAL | Status: AC
  Administered 2021-06-13: 25 mg via ORAL

## 2021-06-13 MED ORDER — SERTRALINE HCL 50 MG PO TABS
50.0000 mg | ORAL_TABLET | Freq: Every day | ORAL | Status: DC
  Administered 2021-06-14: 50 mg via ORAL
  Filled 2021-06-13: qty 1

## 2021-06-13 MED ORDER — TRAZODONE HCL 50 MG PO TABS
50.0000 mg | ORAL_TABLET | Freq: Every day | ORAL | Status: DC
  Administered 2021-06-13: 50 mg via ORAL
  Filled 2021-06-13: qty 1

## 2021-06-13 MED ORDER — POTASSIUM CHLORIDE CRYS ER 20 MEQ PO TBCR
40.0000 meq | EXTENDED_RELEASE_TABLET | Freq: Once | ORAL | Status: AC
  Administered 2021-06-13: 40 meq via ORAL
  Filled 2021-06-13: qty 2

## 2021-06-13 NOTE — Progress Notes (Signed)
Mobility Specialist Progress Note:   06/13/21 1600  Mobility  Activity Ambulated with assistance in hallway  Level of Assistance Contact guard assist, steadying assist  Assistive Device None  Distance Ambulated (ft) 275 ft  Activity Response Tolerated well  $Mobility charge 1 Mobility   Pt agreeable to mobility session after min encouragement. Required CGA throughout for steadying. No overt LOB noted. Pt left in chair with all needs met.   Nelta Numbers Acute Rehab Secure Chat or Office Phone: [phone]

## 2021-06-13 NOTE — Progress Notes (Signed)
PROGRESS NOTE    [name]  WUJ:[id number] DOB: May 14, 1933 DOA: 06/12/2021 PCP: Doreene Nest, NP    Brief Narrative:   [name] is a 88 y.o. male with past medical history significant for HTN, HLD, CAD, history of choledocholithiasis, history of laparoscopic cholecystectomy who presented to [facility] ED for from home for abnormal labs.  Recently seen by PCP for evaluation to initiate medications for toenail fungus with noted elevated LFTs with concern for possible cholangitis.  Patient's daughter who assists with HPI reports concern over being slightly jaundiced on his eyes.  Overall patient had no specific complaints.  In the ED, temperature 98.1 F, HR 88, RR 16, BP 105/55, SPO2 98% on room air.  WBC 15.2, hemoglobin 14.5, platelets 64.  Sodium 138, potassium 5.3, chloride 107, CO2 24, glucose 91, BUN 17, creatinine 0.98.  Alkaline phosphatase 345, lipase 31, AST 89, ALT 137, total bilirubin 3.9.  Urinalysis with trace leukocytes, negative nitrite, no bacteria, 6-10 WBCs.  CT abdomen/pelvis with hyperenhancement of the wall of the common bile duct concerning for cholangitis, no dilation of the intrahepatic bile ducts, diverticulosis without diverticulitis.  Hospitalist service consulted for further evaluation management.  Assessment & Plan:   Transaminitis with hyperbilirubinemia Patient presenting to the ED by direction of PCP for abnormal labs.  Was noted to have significant elevated liver enzymes and total bilirubin. CT abdomen/pelvis with hyperenhancement of the wall of the common bile duct concerning for cholangitis, no dilation of the intrahepatic bile ducts.  Given concern of cholangitis, elevated WBC count of 15.2; patient was started on empiric antibiotics with ceftriaxone and Flagyl.  Gastroenterology was consulted.  MRCP with no intrahepatic/extrahepatic ductal dilation, no choledocholithiasis, and no findings suspicious for cholangitis, noted history of cholecystectomy.   Suspect etiology from stones or sludge that transiently blocked the common bile duct and now have passed --GI, Dr. Elnoria Howard following, appreciate assistance --WBC 15.2>12.1 --AST 89>41 (174 at PCP) --ALT 137>113 (236 at PCP) --Tbili 3.9>2.5 (9.2 at PCP) --Ceftriaxone 2 g IV every 24 hours --Metronidazole 5 mg IV every 12 hours --Repeat CMP/CBC in a.m.  Hypokalemia Potassium 3.3, will replete. --Repeat electrolytes in a.m. likely magnesium  BPH: Tamsulosin 0.4 mg p.o. daily  Anxiety/depression: --Sertraline 50 mg p.o. daily  Insomnia: Trazodone 50 mg p.o. nightly  CAD/HTN/HLD: Currently not on medication outpatient   DVT prophylaxis: SCDs Start: 06/12/21 2310    Code Status: DNR Family Communication: Updated daughter present at bedside this morning  Disposition Plan:  Level of care: Med-Surg Status is: Inpatient Remains inpatient appropriate because: Continues on IV antibiotics, GI following, continue to trend liver enzymes and CBC    Consultants:  Gastroenterology, Dr. Elnoria Howard  Procedures:  MRCP  Antimicrobials:  Ceftriaxone 5/26>> Metronidazole 5/26>>   Subjective: Patient seen examined bedside, resting currently.  Daughter present.  No specific complaints this morning.  MRI shows no findings of cholangitis and LFTs improving.  Seen by GI and recommend continued IV antibiotics and further monitoring LFTs.  No other complaints or concerns at this time.  Denies headache, no chest pain, no shortness of breath, no abdominal pain, no fever/chills/night sweats, no nausea/vomiting/diarrhea, no focal weakness, no fatigue, no paresthesias.  No acute concerns overnight per nurse staff.  Objective: Vitals:   06/12/21 2330 06/13/21 0003 06/13/21 0622 06/13/21 0848  BP: (!) 119/52 (!) 121/58 (!) 108/49 (!) 123/54  Pulse: (!) 42 (!) 41 77 (!) 43  Resp: 20 19 18 16   Temp:  97.7 F (36.5 C)  98.5 F (36.9 C) 97.8 F (36.6 C)  TempSrc:  Oral Oral Oral  SpO2: 98% 99% 96% 96%   Weight:  59.4 kg    Height:  5' (1.524 m)      Intake/Output Summary (Last 24 hours) at 06/13/2021 1426 Last data filed at 06/13/2021 6295 Gross per 24 hour  Intake --  Output 150 ml  Net -150 ml   Filed Weights   06/12/21 2111 06/13/21 0003  Weight: 60.3 kg 59.4 kg    Examination:  Physical Exam: GEN: NAD, alert and oriented x 3, wd/wn, hard of hearing HEENT: NCAT, PERRL, EOMI, sclera clear, MMM PULM: CTAB w/o wheezes/crackles, normal respiratory effort, on room air CV: RRR w/o M/G/R GI: abd soft, NTND, NABS, no R/G/M MSK: no peripheral edema, muscle strength globally intact 5/5 bilateral upper/lower extremities NEURO: CN II-XII intact, no focal deficits, sensation to light touch intact PSYCH: normal mood/affect Integumentary: dry/intact, no rashes or wounds    Data Reviewed: I have personally reviewed following labs and imaging studies  CBC: Recent Labs  Lab 06/12/21 1921 06/13/21 0059  WBC 15.2* 12.1*  NEUTROABS  --  7.9*  HGB 14.5 13.0  HCT 44.3 39.3  MCV 83.9 83.4  PLT 64* 59*   Basic Metabolic Panel: Recent Labs  Lab 06/10/21 1547 06/12/21 1921 06/13/21 0059  NA 136 138 140  K 4.8 5.3* 3.3*  CL 105 107 110  CO2 24 24 23   GLUCOSE 154* 91 81  BUN 15 17 13   CREATININE 1.49 0.98 0.82  CALCIUM 9.2 8.8* 8.1*  MG  --   --  2.0   GFR: Estimated Creatinine Clearance: 46.6 mL/min (by C-G formula based on SCr of 0.82 mg/dL). Liver Function Tests: Recent Labs  Lab 06/10/21 1547 06/12/21 1921 06/13/21 0059  AST 174* 89* 41  ALT 236* 137* 113*  ALKPHOS 473* 345* 284*  BILITOT 9.2* 3.9* 2.5*  PROT 5.5* 5.1* 4.3*  ALBUMIN 3.8 3.2* 2.6*   Recent Labs  Lab 06/12/21 1921  LIPASE 31   No results for input(s): AMMONIA in the last 168 hours. Coagulation Profile: No results for input(s): INR, PROTIME in the last 168 hours. Cardiac Enzymes: No results for input(s): CKTOTAL, CKMB, CKMBINDEX, TROPONINI in the last 168 hours. BNP (last 3 results) No  results for input(s): PROBNP in the last 8760 hours. HbA1C: No results for input(s): HGBA1C in the last 72 hours. CBG: No results for input(s): GLUCAP in the last 168 hours. Lipid Profile: No results for input(s): CHOL, HDL, LDLCALC, TRIG, CHOLHDL, LDLDIRECT in the last 72 hours. Thyroid Function Tests: No results for input(s): TSH, T4TOTAL, FREET4, T3FREE, THYROIDAB in the last 72 hours. Anemia Panel: No results for input(s): VITAMINB12, FOLATE, FERRITIN, TIBC, IRON, RETICCTPCT in the last 72 hours. Sepsis Labs: Recent Labs  Lab 06/13/21 0059  PROCALCITON 0.35    Recent Results (from the past 240 hour(s))  Culture, blood (routine x 2)     Status: None (Preliminary result)   Collection Time: 06/12/21 11:08 PM   Specimen: BLOOD LEFT HAND  Result Value Ref Range Status   Specimen Description BLOOD LEFT HAND  Final   Special Requests   Final    BOTTLES DRAWN AEROBIC AND ANAEROBIC Blood Culture adequate volume   Culture   Final    NO GROWTH < 12 HOURS Performed at [facility], [address] [place], [place] [address]    Report Status PENDING  Incomplete  Culture, blood (routine x 2)  Status: None (Preliminary result)   Collection Time: 06/12/21 11:11 PM   Specimen: BLOOD RIGHT HAND  Result Value Ref Range Status   Specimen Description BLOOD RIGHT HAND  Final   Special Requests   Final    BOTTLES DRAWN AEROBIC AND ANAEROBIC Blood Culture results may not be optimal due to an excessive volume of blood received in culture bottles   Culture   Final    NO GROWTH < 12 HOURS Performed at [facility], [address] [place], [place] [address]    Report Status PENDING  Incomplete         Radiology Studies: CT ABDOMEN PELVIS W CONTRAST  Result Date: 06/12/2021 CLINICAL DATA:  Elevated LFTs and bilirubin, abdominal pain EXAM: CT ABDOMEN AND PELVIS WITH CONTRAST TECHNIQUE: Multidetector CT imaging of the abdomen and pelvis was performed using the standard  protocol following bolus administration of intravenous contrast. RADIATION DOSE REDUCTION: This exam was performed according to the departmental dose-optimization program which includes automated exposure control, adjustment of the mA and/or kV according to patient size and/or use of iterative reconstruction technique. CONTRAST:  80mL ISOVUE-300 IOPAMIDOL (ISOVUE-300) INJECTION 61% COMPARISON:  CT abdomen and pelvis dated September 10, 2019 FINDINGS: Lower chest: Bibasilar atelectasis. Coronary artery calcifications and mitral annular calcifications. Enlarged periaortic lymph node measuring 8 mm in short axis on series 2, image 14, unchanged compared to prior exam. Hepatobiliary: No suspicious liver lesions. Gallbladder is surgically absent. Prominence of the common bile ducts not unexpected status post cholecystectomy. Hyperenhancement of the wall of the common bile duct. No dilation of the intrahepatic bile ducts. Pancreas: Unremarkable. No pancreatic ductal dilatation or surrounding inflammatory changes. Spleen: Normal in size without focal abnormality. Adrenals/Urinary Tract: Bilateral adrenal glands are unremarkable. No hydronephrosis or nephrolithiasis. Bilateral simple appearing parapelvic cysts, no further follow-up imaging is recommended for these lesions. Bladder is unremarkable. Stomach/Bowel: Small hiatal hernia. Stomach is otherwise unremarkable. Normal appendix. Severe sigmoid diverticulosis. No bowel wall thickening, inflammatory change or evidence of obstruction. Vascular/Lymphatic: Atherosclerotic disease of the abdominal aorta. Calcified splenic artery aneurysm measuring 1.4 cm on series 2, image 23, unchanged when compared to prior exam. Reproductive: Prostatomegaly. Other: Small fat containing left inguinal hernia. No abdominopelvic ascites. Musculoskeletal: Prior right hip arthroplasty. Multilevel degenerative disc disease of the thoracolumbar spine. No aggressive appearing osseous lesions.  IMPRESSION: 1. Hyperenhancement of the wall of the common bile duct, concerning for cholangitis. 2. Diverticulosis with no diverticulitis. 3. Aortic Atherosclerosis (ICD10-I70.0). These results will be called to the ordering clinician or representative by the Radiologist Assistant, and communication documented in the PACS or [employer]. Electronically Signed   By: Allegra Lai M.D.   On: 06/12/2021 10:32   MR ABDOMEN MRCP W WO CONTAST  Result Date: 06/12/2021 CLINICAL DATA:  Right upper quadrant pain, elevated LFTs, concern for cholangitis on CT EXAM: MRI ABDOMEN WITHOUT AND WITH CONTRAST (INCLUDING MRCP) TECHNIQUE: Multiplanar multisequence MR imaging of the abdomen was performed both before and after the administration of intravenous contrast. Heavily T2-weighted images of the biliary and pancreatic ducts were obtained, and three-dimensional MRCP images were rendered by post processing. CONTRAST:  6mL GADAVIST GADOBUTROL 1 MMOL/ML IV SOLN COMPARISON:  CT abdomen/pelvis dated 06/12/2021 FINDINGS: Motion degraded images. Lower chest: Mild bilateral lower lobe atelectasis. Hepatobiliary: Liver is within normal limits. No suspicious/enhancing hepatic lesions. No hepatic steatosis. Status post cholecystectomy. No intrahepatic or extrahepatic ductal dilatation. Common duct measures 8 mm, within normal limits. No choledocholithiasis is seen. No convincing findings to  suggest cholangitis on MR. Pancreas:  Within normal limits. Spleen:  Mild splenomegaly. Adrenals/Urinary Tract:  Adrenal glands are within normal limits. Bilateral renal sinus cysts.  No hydronephrosis. Stomach/Bowel: Stomach is within normal limits. Visualized bowel is unremarkable. Vascular/Lymphatic:  No evidence of abdominal aortic aneurysm. No suspicious abdominal lymphadenopathy. Other:  No abdominal ascites. Musculoskeletal: Lower thoracic/upper lumbar dextroscoliosis with degenerative changes. IMPRESSION: Status post cholecystectomy. No  intrahepatic or extrahepatic ductal dilatation. No choledocholithiasis is seen. No findings suspicious for cholangitis on MR. Additional ancillary findings as above. Electronically Signed   By: Charline Bills M.D.   On: 06/12/2021 23:04        Scheduled Meds:  [START ON 06/14/2021] sertraline  50 mg Oral Daily   tamsulosin  0.4 mg Oral Daily   traZODone  50 mg Oral QHS   Continuous Infusions:  cefTRIAXone (ROCEPHIN)  IV Stopped (06/13/21 0044)   metronidazole 500 mg (06/13/21 1039)     LOS: 0 days    Time spent: 49 minutes spent on chart review, discussion with nursing staff, consultants, updating family and interview/physical exam; more than 50% of that time was spent in counseling and/or coordination of care.    Alvira Philips [place], DO [facility] Available via Epic secure chat 7am-7pm After these hours, please refer to coverage provider listed on [email or web address] 06/13/2021, 2:26 PM

## 2021-06-13 NOTE — Consult Note (Signed)
Reason for Consult: ? Ascending cholangitis Referring Physician: [facility]  [name] HPI: This is an 88 year old male with a PMH of choledocholithiasis, HTN, hyperlipidemia, and CAD admitted for abnormal liver enzymes.  The patient states that he was seeking treatment for toenail fungus.  Routine blood work was obtained and it showed an elevation in his liver enzymes as well as an elevation in his WBC.  He was instructed to present to the ER.  He denied any issues with fever or abdominal pain.  Imaging with the CT scan was concerning for an ascending cholangitis, but the MRCP was negative for choledocholithiasis or cholangitis.  He had a very similar presentation in 2017 with the elevated liver enzymes.  At that time he did complain about abdominal pain.  An ERCP was performed an it was negative for any stones.  He did have stones in 06/2013 and in 2012.  Past Medical History:  Diagnosis Date   Arthralgia of multiple joints    limited mobility w/  independant adl's   BPH with obstruction/lower urinary tract symptoms    Chronic idiopathic monocytosis    followed by dr Waymon Budge--  per lov note 06/ 2017 persistant unexplained  with normal bone barrow bx   Coronary atherosclerosis of unspecified type of vessel, native or graft    cardiologist-  dr Stanford Breed -- per lov note 11-19-2014 ,  cardiac cath in 2000-- pLAD 60-70%,  small intermediate branch 70-80%,  mRCA 20%,  normal LV   Degenerative arthritis of spine    cervical and lumar   Diverticulosis of colon (without mention of hemorrhage)    Dysuria    chronic   Elevated PSA    Feeling of incomplete bladder emptying    Hiatal hernia    moderate per ct 11/ 2017   History of adenomatous polyp of colon    2008   History of atrial fibrillation    remote hx episode   History of COVID-19 03/05/2020   History of pancreatitis    07-01-2013  and 10-10-2015   History of prostatitis    2014   Hx of colonic polyps     Hyperlipidemia    Hypertension    Mouth sore    roof of mouth sore    Nephrolithiasis    Nocturia more than twice per night    severe  w/ leakage   OSA (obstructive sleep apnea)    INTOLERANT CPAP   Osteoarthritis    Pernicious anemia    B12  Def.   Peyronie disease    Renal insufficiency    Sepsis ([place]) 03/22/2017   Thrombocytopenia, unspecified ([place]) hemotology/oncologist-  dr Waymon Budge   per dr Velta Addison note 06/ 2016  secondary to vitro clumping   Vitamin B deficiency     Past Surgical History:  Procedure Laterality Date   BONE MARROW BIOPSY  2011   normal   CARDIAC CATHETERIZATION  2000   per dr Kathyrn Drown note --  dLAD 60-70%,  small intermediate branch 70-80%,  mRCA 20%,  normal LV   CARDIOVASCULAR STRESS TEST  01-29-2013   dr Stanford Breed   normal nuclear study w/ no ischemia,  normal LV function and wall motion , ef 82%   CATARACT EXTRACTION W/ INTRAOCULAR LENS  IMPLANT, BILATERAL  08/2015   CHOLECYSTECTOMY  11/24/2010   Procedure: LAPAROSCOPIC CHOLECYSTECTOMY WITH INTRAOPERATIVE CHOLANGIOGRAM;  Surgeon: Earnstine Regal, MD;  Location: [facility];  Service: General;  Laterality: N/A;  c-arm  CYSTOSCOPY W/ URETERAL STENT PLACEMENT Left 12/17/2015   Procedure: CYSTOSCOPY WITH RETROGRADE PYELOGRAM/URETERAL STENT PLACEMENT;  Surgeon: Ardis Hughs, MD;  Location: [facility];  Service: Urology;  Laterality: Left;   CYSTOSCOPY WITH RETROGRADE PYELOGRAM, URETEROSCOPY AND STENT PLACEMENT Left 12/22/2015   Procedure: CYSTOSCOPY WITH LEFT RETROGRADE  URETEROSCOPY AND STENT PLACEMENT;  Surgeon: Carolan Clines, MD;  Location: [facility];  Service: Urology;  Laterality: Left;   ERCP N/A 07/04/2013   Procedure: ENDOSCOPIC RETROGRADE CHOLANGIOPANCREATOGRAPHY (ERCP);  Surgeon: Gatha Mayer, MD;  Location: [facility];  Service: Endoscopy;  Laterality: N/A;  MAC if available   ERCP N/A 10/12/2015   Procedure: ENDOSCOPIC RETROGRADE CHOLANGIOPANCREATOGRAPHY (ERCP);   Surgeon: [name], MD;  Location: [facility];  Service: Endoscopy;  Laterality: N/A;   HOLMIUM LASER APPLICATION Left 91/06/3844   Procedure: HOLMIUM LASER APPLICATION;  Surgeon: Carolan Clines, MD;  Location: [facility];  Service: Urology;  Laterality: Left;   INGUINAL HERNIA REPAIR Right 01/25/2002   KNEE ARTHROSCOPY  x5   SATURATION BIOPSY OF PROSTATE  05-29-2007  and 01-26-2008   SHOULDER ARTHROSCOPY WITH OPEN ROTATOR CUFF REPAIR AND DISTAL CLAVICLE ACROMINECTOMY Right 11/19/2004   TOTAL HIP ARTHROPLASTY Right 05/21/2009   TOTAL KNEE ARTHROPLASTY Bilateral left 05-02-2003/  right  03-23-2010   TRANSTHORACIC ECHOCARDIOGRAM  12/23/2004   ef 60%, mild MV calcification without stenosis/  mild TR,  PASP 44mHg   UVULOPALATOPHARYNGOPLASTY  1993    w/  T & A    Family History  Problem Relation Age of Onset   Colon cancer Father 426  Peripheral vascular disease Mother     Social History:  reports that he quit smoking about 55 years ago. His smoking use included pipe. He has never used smokeless tobacco. He reports that he does not drink alcohol and does not use drugs.  Allergies:  Allergies  Allergen Reactions   Penicillins Hives and Other (See Comments)    [place]ps, passed out Tolerates cephalosporins  Has patient had a PCN reaction causing immediate rash, facial/tongue/throat swelling, SOB or lightheadedness with hypotension:  yes Has patient had a PCN reaction causing severe rash involving mucus membranes or skin necrosis: no Has patient had a PCN reaction that required hospitalization: no Has patient had a PCN reaction occurring within the last 10 years: no If all of the above answers are "NO", then may proceed with Cephalosporin use.    Tape Hives    PAPER TAPE    Medications: Scheduled:  sertraline  25 mg Oral Daily   tamsulosin  0.4 mg Oral Daily   traZODone  50 mg Oral QHS   Continuous:  sodium chloride 75 mL/hr at 06/13/21 0041   cefTRIAXone  (ROCEPHIN)  IV Stopped (06/13/21 0044)   metronidazole 500 mg (06/13/21 0044)    Results for orders placed or performed during the hospital encounter of 06/12/21 (from the past 24 hour(s))  Urinalysis, Routine w reflex microscopic Urine, Clean Catch     Status: Abnormal   Collection Time: 06/12/21  7:12 PM  Result Value Ref Range   Color, Urine AMBER (A) YELLOW   APPearance CLEAR CLEAR   Specific Gravity, Urine >1.046 (H) 1.005 - 1.030   pH 5.0 5.0 - 8.0   Glucose, UA NEGATIVE NEGATIVE mg/dL   Hgb urine dipstick NEGATIVE NEGATIVE   Bilirubin Urine NEGATIVE NEGATIVE   Ketones, ur NEGATIVE NEGATIVE mg/dL   Protein, ur NEGATIVE NEGATIVE mg/dL   Nitrite NEGATIVE NEGATIVE   Leukocytes,Ua TRACE (  A) NEGATIVE   RBC / HPF 0-5 0 - 5 RBC/hpf   WBC, UA 6-10 0 - 5 WBC/hpf   Bacteria, UA NONE SEEN NONE SEEN   Squamous Epithelial / LPF 0-5 0 - 5  Lipase, blood     Status: None   Collection Time: 06/12/21  7:21 PM  Result Value Ref Range   Lipase 31 11 - 51 U/L  Comprehensive metabolic panel     Status: Abnormal   Collection Time: 06/12/21  7:21 PM  Result Value Ref Range   Sodium 138 135 - 145 mmol/L   Potassium 5.3 (H) 3.5 - 5.1 mmol/L   Chloride 107 98 - 111 mmol/L   CO2 24 22 - 32 mmol/L   Glucose, Bld 91 70 - 99 mg/dL   BUN 17 8 - 23 mg/dL   Creatinine, Ser 0.98 0.61 - 1.24 mg/dL   Calcium 8.8 (L) 8.9 - 10.3 mg/dL   Total Protein 5.1 (L) 6.5 - 8.1 g/dL   Albumin 3.2 (L) 3.5 - 5.0 g/dL   AST 89 (H) 15 - 41 U/L   ALT 137 (H) 0 - 44 U/L   Alkaline Phosphatase 345 (H) 38 - 126 U/L   Total Bilirubin 3.9 (H) 0.3 - 1.2 mg/dL   GFR, Estimated >60 >60 mL/min   Anion gap 7 5 - 15  CBC     Status: Abnormal   Collection Time: 06/12/21  7:21 PM  Result Value Ref Range   WBC 15.2 (H) 4.0 - 10.5 K/uL   RBC 5.28 4.22 - 5.81 MIL/uL   Hemoglobin 14.5 13.0 - 17.0 g/dL   HCT 44.3 39.0 - 52.0 %   MCV 83.9 80.0 - 100.0 fL   MCH 27.5 26.0 - 34.0 pg   MCHC 32.7 30.0 - 36.0 g/dL   RDW 15.9 (H)  11.5 - 15.5 %   Platelets 64 (L) 150 - 400 K/uL   nRBC 0.0 0.0 - 0.2 %  Culture, blood (routine x 2)     Status: None (Preliminary result)   Collection Time: 06/12/21 11:08 PM   Specimen: BLOOD LEFT HAND  Result Value Ref Range   Specimen Description BLOOD LEFT HAND    Special Requests      BOTTLES DRAWN AEROBIC AND ANAEROBIC Blood Culture adequate volume   Culture      NO GROWTH < 12 HOURS Performed at [facility], [address], [place], [place] [address]    Report Status PENDING   Culture, blood (routine x 2)     Status: None (Preliminary result)   Collection Time: 06/12/21 11:11 PM   Specimen: BLOOD RIGHT HAND  Result Value Ref Range   Specimen Description BLOOD RIGHT HAND    Special Requests      BOTTLES DRAWN AEROBIC AND ANAEROBIC Blood Culture results may not be optimal due to an excessive volume of blood received in culture bottles   Culture      NO GROWTH < 12 HOURS Performed at [facility] [address] [place], [place] [address]    Report Status PENDING   CBC with Differential/Platelet     Status: Abnormal   Collection Time: 06/13/21 12:59 AM  Result Value Ref Range   WBC 12.1 (H) 4.0 - 10.5 K/uL   RBC 4.71 4.22 - 5.81 MIL/uL   Hemoglobin 13.0 13.0 - 17.0 g/dL   HCT 39.3 39.0 - 52.0 %   MCV 83.4 80.0 - 100.0 fL   MCH 27.6  26.0 - 34.0 pg   MCHC 33.1 30.0 - 36.0 g/dL   RDW 15.8 (H) 11.5 - 15.5 %   Platelets 59 (L) 150 - 400 K/uL   nRBC 0.0 0.0 - 0.2 %   Neutrophils Relative % 65 %   Neutro Abs 7.9 (H) 1.7 - 7.7 K/uL   Lymphocytes Relative 9 %   Lymphs Abs 1.1 0.7 - 4.0 K/uL   Monocytes Relative 18 %   Monocytes Absolute 2.1 (H) 0.1 - 1.0 K/uL   Eosinophils Relative 0 %   Eosinophils Absolute 0.0 0.0 - 0.5 K/uL   Basophils Relative 0 %   Basophils Absolute 0.1 0.0 - 0.1 K/uL   WBC Morphology MILD LEFT SHIFT (1-5% METAS, OCC MYELO, OCC BANDS)    RBC Morphology MORPHOLOGY UNREMARKABLE    Smear Review PLATELETS APPEAR DECREASED    Immature  Granulocytes 8 %   Abs Immature Granulocytes 0.94 (H) 0.00 - 0.07 K/uL  Comprehensive metabolic panel     Status: Abnormal   Collection Time: 06/13/21 12:59 AM  Result Value Ref Range   Sodium 140 135 - 145 mmol/L   Potassium 3.3 (L) 3.5 - 5.1 mmol/L   Chloride 110 98 - 111 mmol/L   CO2 23 22 - 32 mmol/L   Glucose, Bld 81 70 - 99 mg/dL   BUN 13 8 - 23 mg/dL   Creatinine, Ser 0.82 0.61 - 1.24 mg/dL   Calcium 8.1 (L) 8.9 - 10.3 mg/dL   Total Protein 4.3 (L) 6.5 - 8.1 g/dL   Albumin 2.6 (L) 3.5 - 5.0 g/dL   AST 41 15 - 41 U/L   ALT 113 (H) 0 - 44 U/L   Alkaline Phosphatase 284 (H) 38 - 126 U/L   Total Bilirubin 2.5 (H) 0.3 - 1.2 mg/dL   GFR, Estimated >60 >60 mL/min   Anion gap 7 5 - 15  Magnesium     Status: None   Collection Time: 06/13/21 12:59 AM  Result Value Ref Range   Magnesium 2.0 1.7 - 2.4 mg/dL  Procalcitonin - Baseline     Status: None   Collection Time: 06/13/21 12:59 AM  Result Value Ref Range   Procalcitonin 0.35 ng/mL     CT ABDOMEN PELVIS W CONTRAST  Result Date: 06/12/2021 CLINICAL DATA:  Elevated LFTs and bilirubin, abdominal pain EXAM: CT ABDOMEN AND PELVIS WITH CONTRAST TECHNIQUE: Multidetector CT imaging of the abdomen and pelvis was performed using the standard protocol following bolus administration of intravenous contrast. RADIATION DOSE REDUCTION: This exam was performed according to the departmental dose-optimization program which includes automated exposure control, adjustment of the mA and/or kV according to patient size and/or use of iterative reconstruction technique. CONTRAST:  69m ISOVUE-300 IOPAMIDOL (ISOVUE-300) INJECTION 61% COMPARISON:  CT abdomen and pelvis dated September 10, 2019 FINDINGS: Lower chest: Bibasilar atelectasis. Coronary artery calcifications and mitral annular calcifications. Enlarged periaortic lymph node measuring 8 mm in short axis on series 2, image 14, unchanged compared to prior exam. Hepatobiliary: No suspicious liver lesions.  Gallbladder is surgically absent. Prominence of the common bile ducts not unexpected status post cholecystectomy. Hyperenhancement of the wall of the common bile duct. No dilation of the intrahepatic bile ducts. Pancreas: Unremarkable. No pancreatic ductal dilatation or surrounding inflammatory changes. Spleen: Normal in size without focal abnormality. Adrenals/Urinary Tract: Bilateral adrenal glands are unremarkable. No hydronephrosis or nephrolithiasis. Bilateral simple appearing parapelvic cysts, no further follow-up imaging is recommended for these lesions. Bladder is unremarkable. Stomach/Bowel: Small hiatal hernia. Stomach  is otherwise unremarkable. Normal appendix. Severe sigmoid diverticulosis. No bowel wall thickening, inflammatory change or evidence of obstruction. Vascular/Lymphatic: Atherosclerotic disease of the abdominal aorta. Calcified splenic artery aneurysm measuring 1.4 cm on series 2, image 23, unchanged when compared to prior exam. Reproductive: Prostatomegaly. Other: Small fat containing left inguinal hernia. No abdominopelvic ascites. Musculoskeletal: Prior right hip arthroplasty. Multilevel degenerative disc disease of the thoracolumbar spine. No aggressive appearing osseous lesions. IMPRESSION: 1. Hyperenhancement of the wall of the common bile duct, concerning for cholangitis. 2. Diverticulosis with no diverticulitis. 3. Aortic Atherosclerosis (ICD10-I70.0). These results will be called to the ordering clinician or representative by the Radiologist Assistant, and communication documented in the PACS or [employer]. Electronically Signed   By: Yetta Glassman M.D.   On: 06/12/2021 10:32   MR ABDOMEN MRCP W WO CONTAST  Result Date: 06/12/2021 CLINICAL DATA:  Right upper quadrant pain, elevated LFTs, concern for cholangitis on CT EXAM: MRI ABDOMEN WITHOUT AND WITH CONTRAST (INCLUDING MRCP) TECHNIQUE: Multiplanar multisequence MR imaging of the abdomen was performed both before and  after the administration of intravenous contrast. Heavily T2-weighted images of the biliary and pancreatic ducts were obtained, and three-dimensional MRCP images were rendered by post processing. CONTRAST:  63m GADAVIST GADOBUTROL 1 MMOL/ML IV SOLN COMPARISON:  CT abdomen/pelvis dated 06/12/2021 FINDINGS: Motion degraded images. Lower chest: Mild bilateral lower lobe atelectasis. Hepatobiliary: Liver is within normal limits. No suspicious/enhancing hepatic lesions. No hepatic steatosis. Status post cholecystectomy. No intrahepatic or extrahepatic ductal dilatation. Common duct measures 8 mm, within normal limits. No choledocholithiasis is seen. No convincing findings to suggest cholangitis on MR. Pancreas:  Within normal limits. Spleen:  Mild splenomegaly. Adrenals/Urinary Tract:  Adrenal glands are within normal limits. Bilateral renal sinus cysts.  No hydronephrosis. Stomach/Bowel: Stomach is within normal limits. Visualized bowel is unremarkable. Vascular/Lymphatic:  No evidence of abdominal aortic aneurysm. No suspicious abdominal lymphadenopathy. Other:  No abdominal ascites. Musculoskeletal: Lower thoracic/upper lumbar dextroscoliosis with degenerative changes. IMPRESSION: Status post cholecystectomy. No intrahepatic or extrahepatic ductal dilatation. No choledocholithiasis is seen. No findings suspicious for cholangitis on MR. Additional ancillary findings as above. Electronically Signed   By: SJulian HyM.D.   On: 06/12/2021 23:04    ROS:  As stated above in the HPI otherwise negative.  Blood pressure (!) 123/54, pulse (!) 43, temperature 97.8 F (36.6 C), temperature source Oral, resp. rate 16, height 5' (1.524 m), weight 59.4 kg, SpO2 96 %.    PE: Gen: NAD, Alert and Oriented HEENT:  [place]/AT, EOMI Neck: Supple, no LAD Lungs: CTA Bilaterally CV: RRR without M/G/R ABD: Soft, NTND, +BS Ext: No C/C/E  Assessment/Plan: 1) Abnormal liver enzymes - improving. 2) Abnormal CT scan - MRI  negative for cholangitis or stones. 3) Mild leukocytosis.   The patient is clinically stable.  It is not clear why he had the elevated liver enzymes.  It is possible he did have some stones that transiently blocked the CBD and now passed.  His WBC decreased with antibiotics.  Plan: 1) Monitor liver enzymes. 2) Maintain antibiotics. 3) If he continues to improve he can be discharged home tomorrow.  Raeshawn Vo D 06/13/2021, 9:09 AM

## 2021-06-13 NOTE — Progress Notes (Signed)
Patient' daughter requested for his Dad  to have trazodone at night, flomax and zoloft in the morning, MD notified but no new order. We continue to monitor.

## 2021-06-14 DIAGNOSIS — K8309 Other cholangitis: Secondary | ICD-10-CM | POA: Diagnosis not present

## 2021-06-14 LAB — COMPREHENSIVE METABOLIC PANEL
ALT: 82 U/L — ABNORMAL HIGH (ref 0–44)
AST: 34 U/L (ref 15–41)
Albumin: 2.5 g/dL — ABNORMAL LOW (ref 3.5–5.0)
Alkaline Phosphatase: 243 U/L — ABNORMAL HIGH (ref 38–126)
Anion gap: 5 (ref 5–15)
BUN: 13 mg/dL (ref 8–23)
CO2: 21 mmol/L — ABNORMAL LOW (ref 22–32)
Calcium: 7.9 mg/dL — ABNORMAL LOW (ref 8.9–10.3)
Chloride: 116 mmol/L — ABNORMAL HIGH (ref 98–111)
Creatinine, Ser: 0.87 mg/dL (ref 0.61–1.24)
GFR, Estimated: >60 mL/min (ref >60)
Glucose, Bld: 100 mg/dL — ABNORMAL HIGH (ref 70–99)
Potassium: 4.1 mmol/L (ref 3.5–5.1)
Sodium: 142 mmol/L (ref 135–145)
Total Bilirubin: 1.7 mg/dL — ABNORMAL HIGH (ref 0.3–1.2)
Total Protein: 4.2 g/dL — ABNORMAL LOW (ref 6.5–8.1)

## 2021-06-14 LAB — CBC
HCT: 38.3 % — ABNORMAL LOW (ref 39.0–52.0)
Hemoglobin: 12.2 g/dL — ABNORMAL LOW (ref 13.0–17.0)
MCH: 27.2 pg (ref 26.0–34.0)
MCHC: 31.9 g/dL (ref 30.0–36.0)
MCV: 85.3 fL (ref 80.0–100.0)
Platelets: 69 10*3/uL — ABNORMAL LOW (ref 150–400)
RBC: 4.49 MIL/uL (ref 4.22–5.81)
RDW: 15.9 % — ABNORMAL HIGH (ref 11.5–15.5)
WBC: 11.3 10*3/uL — ABNORMAL HIGH (ref 4.0–10.5)
nRBC: 0 % (ref 0.0–0.2)

## 2021-06-14 LAB — MAGNESIUM: Magnesium: 1.8 mg/dL (ref 1.7–2.4)

## 2021-06-14 MED ORDER — CIPROFLOXACIN HCL 500 MG PO TABS: 500.0000 mg | ORAL_TABLET | Freq: Two times a day (BID) | ORAL | 0 refills | Status: DC

## 2021-06-14 NOTE — Progress Notes (Signed)
Mobility Specialist Progress Note:   06/14/21 1000  Mobility  Activity  (bed level exercises)  Range of Motion/Exercises Active;All extremities  Level of Assistance Independent  Assistive Device None  Activity Response Tolerated well  $Mobility charge 1 Mobility   Pt declining all OOB mobility this am, saying "no" as soon as I walked in the door. States he had a rough night, wouldn't give details. Performed bed level exercises, tolerating well, declined further mobility. Pt left with all needs met.   Nelta Numbers [facility] Secure Chat or Office Phone: [phone]

## 2021-06-14 NOTE — Discharge Summary (Signed)
Physician Discharge Summary  [name] [id number]:[id number] DOB: May 18, 1933 DOA: 06/12/2021  PCP: Pleas Koch, NP  Admit date: 06/12/2021 Discharge date: 06/14/2021  Admitted From: Home Disposition: Home  Recommendations for Outpatient Follow-up:  Follow up with PCP in 1-2 weeks Follow-up with gastroenterology, Dr. Havery Moros as scheduled Continue antibiotics with ciprofloxacin 500 mg p.o. twice daily for 5 additional days for concerns of cholangitis Please obtain CMP 1 week to ensure her LFTs/bilirubin normalize  Home Health: No Equipment/Devices: None  Discharge Condition: Stable CODE STATUS: DNR Diet recommendation: Heart healthy diet  History of present illness:  [name] is a 88 y.o. male with past medical history significant for HTN, HLD, CAD, history of choledocholithiasis, history of laparoscopic cholecystectomy who presented to [facility] ED for from home for abnormal labs.  Recently seen by PCP for evaluation to initiate medications for toenail fungus with noted elevated LFTs with concern for possible cholangitis.  Patient's daughter who assists with HPI reports concern over being slightly jaundiced on his eyes.  Overall patient had no specific complaints.   In the ED, temperature 98.1 F, HR 88, RR 16, BP 105/55, SPO2 98% on room air.  WBC 15.2, hemoglobin 14.5, platelets 64.  Sodium 138, potassium 5.3, chloride 107, CO2 24, glucose 91, BUN 17, creatinine 0.98.  Alkaline phosphatase 345, lipase 31, AST 89, ALT 137, total bilirubin 3.9.  Urinalysis with trace leukocytes, negative nitrite, no bacteria, 6-10 WBCs.  CT abdomen/pelvis with hyperenhancement of the wall of the common bile duct concerning for cholangitis, no dilation of the intrahepatic bile ducts, diverticulosis without diverticulitis.  Hospitalist service consulted for further evaluation management.  Hospital course:  Transaminitis with hyperbilirubinemia Patient presenting to the ED by direction of PCP  for abnormal labs.  Was noted to have significant elevated liver enzymes and total bilirubin. CT abdomen/pelvis with hyperenhancement of the wall of the common bile duct concerning for cholangitis, no dilation of the intrahepatic bile ducts.  Given concern of cholangitis, elevated WBC count of 15.2; patient was started on empiric antibiotics with ceftriaxone and Flagyl.  Gastroenterology, Dr. Benson Norway was consulted and followed during hospital course.  MRCP with no intrahepatic/extrahepatic ductal dilation, no choledocholithiasis, and no findings suspicious for cholangitis, noted history of cholecystectomy.  Suspect etiology from stones or sludge that transiently blocked the common bile duct and now have passed.  Patient's LFTs continue to trend down during hospitalization.  Patient was started on ceftriaxone and metronidazole due to concern for cholangitis on CT scan and will continue ciprofloxacin 5 mg p.o. twice daily for 5 additional days on discharge per GI recommendations.  Outpatient follow-up with PCP in 1 week with recommendation of repeat LFTs to ensure normalization.  Outpatient follow-up with GI, Dr. Havery Moros as scheduled.  Hypokalemia Repleted during hospitalization.  Potassium 4.1 at time of discharge.   BPH: Tamsulosin 0.4 mg p.o. daily   Anxiety/depression: Sertraline 50 mg p.o. daily   Insomnia: Trazodone 50 mg p.o. nightly   CAD/HTN/HLD: Currently not on medication outpatient  Discharge Diagnoses:  Principal Problem:   Ascending cholangitis Active Problems:   HLD (hyperlipidemia)   Essential hypertension   Coronary atherosclerosis   DNR (do not resuscitate)   Thrombocytopenia (HCC)   BPH (benign prostatic hyperplasia)   Chronic shoulder pain   Cholangitis    Discharge Instructions  Discharge Instructions     Call MD for:  difficulty breathing, headache or visual disturbances   Complete by: As directed    Call MD for:  extreme fatigue  Complete by: As directed     Call MD for:  persistant dizziness or light-headedness   Complete by: As directed    Call MD for:  persistant nausea and vomiting   Complete by: As directed    Call MD for:  severe uncontrolled pain   Complete by: As directed    Call MD for:  temperature >100.4   Complete by: As directed    Diet - low sodium heart healthy   Complete by: As directed    Increase activity slowly   Complete by: As directed       Allergies as of 06/14/2021       Reactions   Penicillins Hives, Other (See Comments)   Whelps, passed out Tolerates cephalosporins Has patient had a PCN reaction causing immediate rash, facial/tongue/throat swelling, SOB or lightheadedness with hypotension:  yes Has patient had a PCN reaction causing severe rash involving mucus membranes or skin necrosis: no Has patient had a PCN reaction that required hospitalization: no Has patient had a PCN reaction occurring within the last 10 years: no If all of the above answers are "NO", then may proceed with Cephalosporin use.   Tape Hives   PAPER TAPE        Medication List     TAKE these medications    acetaminophen 325 MG tablet Commonly known as: TYLENOL Take 650 mg by mouth every 6 (six) hours as needed for mild pain or headache.   ciprofloxacin 500 MG tablet Commonly known as: Cipro Take 1 tablet (500 mg total) by mouth 2 (two) times daily for 5 days.   Ensure Take 237 mLs by mouth daily. Chocolate   sertraline 25 MG tablet Commonly known as: ZOLOFT Take 1 tablet (25 mg total) by mouth daily. For depression What changed:  how much to take additional instructions   tamsulosin 0.4 MG Caps capsule Commonly known as: FLOMAX Take 1 capsule (0.4 mg total) by mouth daily. For urine flow   TEARS NATURALE OP Place 1-2 drops into both eyes at bedtime as needed (dry eyes).   traZODone 50 MG tablet Commonly known as: DESYREL Take 0.5-1 tablets (25-50 mg total) by mouth at bedtime. For sleep. What changed:  how  much to take additional instructions        Follow-up Information     Pleas Koch, NP. Schedule an appointment as soon as possible for a visit in 1 week(s).   Specialty: Internal Medicine Why: Need repeat liver function tests Contact information: [place] [phone]         Armbruster, Carlota Raspberry, MD. Schedule an appointment as soon as possible for a visit.   Specialty: Gastroenterology Contact information: [address] [place] [address] [phone]                Allergies  Allergen Reactions   Penicillins Hives and Other (See Comments)    Whelps, passed out Tolerates cephalosporins  Has patient had a PCN reaction causing immediate rash, facial/tongue/throat swelling, SOB or lightheadedness with hypotension:  yes Has patient had a PCN reaction causing severe rash involving mucus membranes or skin necrosis: no Has patient had a PCN reaction that required hospitalization: no Has patient had a PCN reaction occurring within the last 10 years: no If all of the above answers are "NO", then may proceed with Cephalosporin use.    Tape Hives    PAPER TAPE    Consultations: Gastroenterology, Dr.  Hung   Procedures/Studies: CT ABDOMEN PELVIS W CONTRAST  Result Date: 06/12/2021 CLINICAL DATA:  Elevated LFTs and bilirubin, abdominal pain EXAM: CT ABDOMEN AND PELVIS WITH CONTRAST TECHNIQUE: Multidetector CT imaging of the abdomen and pelvis was performed using the standard protocol following bolus administration of intravenous contrast. RADIATION DOSE REDUCTION: This exam was performed according to the departmental dose-optimization program which includes automated exposure control, adjustment of the mA and/or kV according to patient size and/or use of iterative reconstruction technique. CONTRAST:  4m ISOVUE-300 IOPAMIDOL (ISOVUE-300) INJECTION 61% COMPARISON:  CT abdomen and pelvis dated September 10, 2019 FINDINGS: Lower  chest: Bibasilar atelectasis. Coronary artery calcifications and mitral annular calcifications. Enlarged periaortic lymph node measuring 8 mm in short axis on series 2, image 14, unchanged compared to prior exam. Hepatobiliary: No suspicious liver lesions. Gallbladder is surgically absent. Prominence of the common bile ducts not unexpected status post cholecystectomy. Hyperenhancement of the wall of the common bile duct. No dilation of the intrahepatic bile ducts. Pancreas: Unremarkable. No pancreatic ductal dilatation or surrounding inflammatory changes. Spleen: Normal in size without focal abnormality. Adrenals/Urinary Tract: Bilateral adrenal glands are unremarkable. No hydronephrosis or nephrolithiasis. Bilateral simple appearing parapelvic cysts, no further follow-up imaging is recommended for these lesions. Bladder is unremarkable. Stomach/Bowel: Small hiatal hernia. Stomach is otherwise unremarkable. Normal appendix. Severe sigmoid diverticulosis. No bowel wall thickening, inflammatory change or evidence of obstruction. Vascular/Lymphatic: Atherosclerotic disease of the abdominal aorta. Calcified splenic artery aneurysm measuring 1.4 cm on series 2, image 23, unchanged when compared to prior exam. Reproductive: Prostatomegaly. Other: Small fat containing left inguinal hernia. No abdominopelvic ascites. Musculoskeletal: Prior right hip arthroplasty. Multilevel degenerative disc disease of the thoracolumbar spine. No aggressive appearing osseous lesions. IMPRESSION: 1. Hyperenhancement of the wall of the common bile duct, concerning for cholangitis. 2. Diverticulosis with no diverticulitis. 3. Aortic Atherosclerosis (ICD10-I70.0). These results will be called to the ordering clinician or representative by the Radiologist Assistant, and communication documented in the PACS or [employer] Electronically Signed   By: LYetta GlassmanM.D.   On: 06/12/2021 10:32   MR ABDOMEN MRCP W WO CONTAST  Result  Date: 06/12/2021 CLINICAL DATA:  Right upper quadrant pain, elevated LFTs, concern for cholangitis on CT EXAM: MRI ABDOMEN WITHOUT AND WITH CONTRAST (INCLUDING MRCP) TECHNIQUE: Multiplanar multisequence MR imaging of the abdomen was performed both before and after the administration of intravenous contrast. Heavily T2-weighted images of the biliary and pancreatic ducts were obtained, and three-dimensional MRCP images were rendered by post processing. CONTRAST:  614mGADAVIST GADOBUTROL 1 MMOL/ML IV SOLN COMPARISON:  CT abdomen/pelvis dated 06/12/2021 FINDINGS: Motion degraded images. Lower chest: Mild bilateral lower lobe atelectasis. Hepatobiliary: Liver is within normal limits. No suspicious/enhancing hepatic lesions. No hepatic steatosis. Status post cholecystectomy. No intrahepatic or extrahepatic ductal dilatation. Common duct measures 8 mm, within normal limits. No choledocholithiasis is seen. No convincing findings to suggest cholangitis on MR. Pancreas:  Within normal limits. Spleen:  Mild splenomegaly. Adrenals/Urinary Tract:  Adrenal glands are within normal limits. Bilateral renal sinus cysts.  No hydronephrosis. Stomach/Bowel: Stomach is within normal limits. Visualized bowel is unremarkable. Vascular/Lymphatic:  No evidence of abdominal aortic aneurysm. No suspicious abdominal lymphadenopathy. Other:  No abdominal ascites. Musculoskeletal: Lower thoracic/upper lumbar dextroscoliosis with degenerative changes. IMPRESSION: Status post cholecystectomy. No intrahepatic or extrahepatic ductal dilatation. No choledocholithiasis is seen. No findings suspicious for cholangitis on MR. Additional ancillary findings as above. Electronically Signed   By: SrJulian Hy.D.   On: 06/12/2021 23:04  Subjective: Patient seen examined bedside, resting comfortably.  No family present, updated patient's daughter [name] via telephone this morning.  Seen by GI with continued trend down of LFTs/total bilirubin.   Okay for discharge home with continue antibiotics with ciprofloxacin and outpatient follow-up with PCP/GI.  Patient with no other questions or concerns at this time.  Denies headache, no dizziness, no chest pain, no palpitations, no shortness of breath, no abdominal pain, no weakness, no fatigue, no cough/congestion, no fever/chills/night sweats, no nausea/vomiting/diarrhea, no fatigue, no paresthesias.  No acute events overnight per nursing staff.  Discharge Exam: Vitals:   06/14/21 0436 06/14/21 0940  BP: 115/66 (!) 112/49  Pulse: 78 (!) 40  Resp: 18 16  Temp: 98.2 F (36.8 C) 97.8 F (36.6 C)  SpO2: 96% 97%   Vitals:   06/13/21 1634 06/13/21 2044 06/14/21 0436 06/14/21 0940  BP: (!) 114/40 118/65 115/66 (!) 112/49  Pulse: 71 82 78 (!) 40  Resp: '16 16 18 16  '$ Temp: 98.1 F (36.7 C) 98.2 F (36.8 C) 98.2 F (36.8 C) 97.8 F (36.6 C)  TempSrc: Oral Oral  Oral  SpO2: 98% 97% 96% 97%  Weight:      Height:        Physical Exam: GEN: NAD, alert and oriented x 3, wd/wn, hard of hearing HEENT: NCAT, PERRL, EOMI, sclera clear, MMM, mild jaundice noted to soft palate PULM: CTAB w/o wheezes/crackles, normal respiratory effort, on room air CV: RRR w/o M/G/R GI: abd soft, NTND, NABS, no R/G/M MSK: no peripheral edema, muscle strength globally intact 5/5 bilateral upper/lower extremities NEURO: CN II-XII intact, no focal deficits, sensation to light touch intact PSYCH: normal mood/affect Integumentary: dry/intact, no rashes or wounds    The results of significant diagnostics from this hospitalization (including imaging, microbiology, ancillary and laboratory) are listed below for reference.     Microbiology: Recent Results (from the past 240 hour(s))  Culture, blood (routine x 2)     Status: None (Preliminary result)   Collection Time: 06/12/21 11:08 PM   Specimen: BLOOD LEFT HAND  Result Value Ref Range Status   Specimen Description BLOOD LEFT HAND  Final   Special Requests    Final    BOTTLES DRAWN AEROBIC AND ANAEROBIC Blood Culture adequate volume   Culture   Final    NO GROWTH < 12 HOURS Performed at [facility], [place] [address] [place], [place] [address]    Report Status PENDING  Incomplete  Culture, blood (routine x 2)     Status: None (Preliminary result)   Collection Time: 06/12/21 11:11 PM   Specimen: BLOOD RIGHT HAND  Result Value Ref Range Status   Specimen Description BLOOD RIGHT HAND  Final   Special Requests   Final    BOTTLES DRAWN AEROBIC AND ANAEROBIC Blood Culture results may not be optimal due to an excessive volume of blood received in culture bottles   Culture   Final    NO GROWTH < 12 HOURS Performed at [facility], [place] [address] [place], [place] [address]    Report Status PENDING  Incomplete     Labs: BNP (last 3 results) No results for input(s): BNP in the last 8760 hours. Basic Metabolic Panel: Recent Labs  Lab 06/10/21 1547 06/12/21 1921 06/13/21 0059 06/14/21 0051  NA 136 138 140 142  K 4.8 5.3* 3.3* 4.1  CL 105 107 110 116*  CO2 '24 24 23 '$ 21*  GLUCOSE 154* 91 81 100*  BUN '15 17 13 13  '$ CREATININE 1.49 0.98 0.82 0.87  CALCIUM 9.2 8.8* 8.1* 7.9*  MG  --   --  2.0 1.8   Liver Function Tests: Recent Labs  Lab 06/10/21 1547 06/12/21 1921 06/13/21 0059 06/14/21 0051  AST 174* 89* 41 34  ALT 236* 137* 113* 82*  ALKPHOS 473* 345* 284* 243*  BILITOT 9.2* 3.9* 2.5* 1.7*  PROT 5.5* 5.1* 4.3* 4.2*  ALBUMIN 3.8 3.2* 2.6* 2.5*   Recent Labs  Lab 06/12/21 1921  LIPASE 31   No results for input(s): AMMONIA in the last 168 hours. CBC: Recent Labs  Lab 06/12/21 1921 06/13/21 0059 06/14/21 0051  WBC 15.2* 12.1* 11.3*  NEUTROABS  --  7.9*  --   HGB 14.5 13.0 12.2*  HCT 44.3 39.3 38.3*  MCV 83.9 83.4 85.3  PLT 64* 59* 69*   Cardiac Enzymes: No results for input(s): CKTOTAL, CKMB, CKMBINDEX, TROPONINI in the last 168 hours. BNP: Invalid input(s): POCBNP CBG: No results for input(s): GLUCAP  in the last 168 hours. D-Dimer No results for input(s): DDIMER in the last 72 hours. Hgb A1c No results for input(s): HGBA1C in the last 72 hours. Lipid Profile No results for input(s): CHOL, HDL, LDLCALC, TRIG, CHOLHDL, LDLDIRECT in the last 72 hours. Thyroid function studies No results for input(s): TSH, T4TOTAL, T3FREE, THYROIDAB in the last 72 hours.  Invalid input(s): FREET3 Anemia work up No results for input(s): VITAMINB12, FOLATE, FERRITIN, TIBC, IRON, RETICCTPCT in the last 72 hours. Urinalysis    Component Value Date/Time   COLORURINE AMBER (A) 06/12/2021 1912   APPEARANCEUR CLEAR 06/12/2021 1912   LABSPEC >1.046 (H) 06/12/2021 1912   PHURINE 5.0 06/12/2021 1912   GLUCOSEU NEGATIVE 06/12/2021 [employer] NEGATIVE 06/12/2021 1912   HGBUR negative 06/02/2009 1059   [place] 06/12/2021 1912   BILIRUBINUR 2+ 07/18/2019 [place] 06/12/2021 [place] NEGATIVE 06/12/2021 1912   UROBILINOGEN 1.0 07/18/2019 0840   UROBILINOGEN 1.0 07/01/2013 0450   NITRITE NEGATIVE 06/12/2021 1912   LEUKOCYTESUR TRACE (A) 06/12/2021 1912   Sepsis Labs Invalid input(s): PROCALCITONIN,  WBC,  LACTICIDVEN Microbiology Recent Results (from the past 240 hour(s))  Culture, blood (routine x 2)     Status: None (Preliminary result)   Collection Time: 06/12/21 11:08 PM   Specimen: BLOOD LEFT HAND  Result Value Ref Range Status   Specimen Description BLOOD LEFT HAND  Final   Special Requests   Final    BOTTLES DRAWN AEROBIC AND ANAEROBIC Blood Culture adequate volume   Culture   Final    NO GROWTH < 12 HOURS Performed at [facility], [place] [address], [place], [place] [address]    Report Status PENDING  Incomplete  Culture, blood (routine x 2)     Status: None (Preliminary result)   Collection Time: 06/12/21 11:11 PM   Specimen: BLOOD RIGHT HAND  Result Value Ref Range Status   Specimen Description BLOOD RIGHT HAND  Final   Special Requests   Final     BOTTLES DRAWN AEROBIC AND ANAEROBIC Blood Culture results may not be optimal due to an excessive volume of blood received in culture bottles   Culture   Final    NO GROWTH < 12 HOURS Performed at [facility], [place] [address], [place], [place] [address]    Report Status PENDING  Incomplete     Time coordinating discharge: Over 30 minutes  SIGNED:   Randall Hiss  J [place] DO  [facility] 06/14/2021, 9:50 AM

## 2021-06-14 NOTE — Progress Notes (Signed)
Subjective: No complaints.  No acute events overnight.  Objective: Vital signs in last 24 hours: Temp:  [98.1 F (36.7 C)-98.2 F (36.8 C)] 98.2 F (36.8 C) (05/28 0436) Pulse Rate:  [71-82] 78 (05/28 0436) Resp:  [16-18] 18 (05/28 0436) BP: (114-118)/(40-66) 115/66 (05/28 0436) SpO2:  [96 %-98 %] 96 % (05/28 0436)    Intake/Output from previous day: 05/27 0701 - 05/28 0700 In: 736.8 [P.O.:240; IV Piggyback:496.8] Out: 100 [Urine:100] Intake/Output this shift: No intake/output data recorded.  General appearance: alert and no distress GI: soft, non-tender; bowel sounds normal; no masses,  no organomegaly  Lab Results: Recent Labs    06/12/21 1921 06/13/21 0059 06/14/21 0051  WBC 15.2* 12.1* 11.3*  HGB 14.5 13.0 12.2*  HCT 44.3 39.3 38.3*  PLT 64* 59* 69*   BMET Recent Labs    06/12/21 1921 06/13/21 0059 06/14/21 0051  NA 138 140 142  K 5.3* 3.3* 4.1  CL 107 110 116*  CO2 24 23 21*  GLUCOSE 91 81 100*  BUN '17 13 13  '$ CREATININE 0.98 0.82 0.87  CALCIUM 8.8* 8.1* 7.9*   LFT Recent Labs    06/14/21 0051  PROT 4.2*  ALBUMIN 2.5*  AST 34  ALT 82*  ALKPHOS 243*  BILITOT 1.7*   PT/INR No results for input(s): LABPROT, INR in the last 72 hours. Hepatitis Panel No results for input(s): HEPBSAG, HCVAB, HEPAIGM, HEPBIGM in the last 72 hours. C-Diff No results for input(s): CDIFFTOX in the last 72 hours. Fecal Lactopherrin No results for input(s): FECLLACTOFRN in the last 72 hours.  Studies/Results: CT ABDOMEN PELVIS W CONTRAST  Result Date: 06/12/2021 CLINICAL DATA:  Elevated LFTs and bilirubin, abdominal pain EXAM: CT ABDOMEN AND PELVIS WITH CONTRAST TECHNIQUE: Multidetector CT imaging of the abdomen and pelvis was performed using the standard protocol following bolus administration of intravenous contrast. RADIATION DOSE REDUCTION: This exam was performed according to the departmental dose-optimization program which includes automated exposure control,  adjustment of the mA and/or kV according to patient size and/or use of iterative reconstruction technique. CONTRAST:  93m ISOVUE-300 IOPAMIDOL (ISOVUE-300) INJECTION 61% COMPARISON:  CT abdomen and pelvis dated September 10, 2019 FINDINGS: Lower chest: Bibasilar atelectasis. Coronary artery calcifications and mitral annular calcifications. Enlarged periaortic lymph node measuring 8 mm in short axis on series 2, image 14, unchanged compared to prior exam. Hepatobiliary: No suspicious liver lesions. Gallbladder is surgically absent. Prominence of the common bile ducts not unexpected status post cholecystectomy. Hyperenhancement of the wall of the common bile duct. No dilation of the intrahepatic bile ducts. Pancreas: Unremarkable. No pancreatic ductal dilatation or surrounding inflammatory changes. Spleen: Normal in size without focal abnormality. Adrenals/Urinary Tract: Bilateral adrenal glands are unremarkable. No hydronephrosis or nephrolithiasis. Bilateral simple appearing parapelvic cysts, no further follow-up imaging is recommended for these lesions. Bladder is unremarkable. Stomach/Bowel: Small hiatal hernia. Stomach is otherwise unremarkable. Normal appendix. Severe sigmoid diverticulosis. No bowel wall thickening, inflammatory change or evidence of obstruction. Vascular/Lymphatic: Atherosclerotic disease of the abdominal aorta. Calcified splenic artery aneurysm measuring 1.4 cm on series 2, image 23, unchanged when compared to prior exam. Reproductive: Prostatomegaly. Other: Small fat containing left inguinal hernia. No abdominopelvic ascites. Musculoskeletal: Prior right hip arthroplasty. Multilevel degenerative disc disease of the thoracolumbar spine. No aggressive appearing osseous lesions. IMPRESSION: 1. Hyperenhancement of the wall of the common bile duct, concerning for cholangitis. 2. Diverticulosis with no diverticulitis. 3. Aortic Atherosclerosis (ICD10-I70.0). These results will be called to the  ordering clinician or representative by the  Psychologist, clinical, and communication documented in the PACS or [employer]. Electronically Signed   By: Yetta Glassman M.D.   On: 06/12/2021 10:32   MR ABDOMEN MRCP W WO CONTAST  Result Date: 06/12/2021 CLINICAL DATA:  Right upper quadrant pain, elevated LFTs, concern for cholangitis on CT EXAM: MRI ABDOMEN WITHOUT AND WITH CONTRAST (INCLUDING MRCP) TECHNIQUE: Multiplanar multisequence MR imaging of the abdomen was performed both before and after the administration of intravenous contrast. Heavily T2-weighted images of the biliary and pancreatic ducts were obtained, and three-dimensional MRCP images were rendered by post processing. CONTRAST:  25m GADAVIST GADOBUTROL 1 MMOL/ML IV SOLN COMPARISON:  CT abdomen/pelvis dated 06/12/2021 FINDINGS: Motion degraded images. Lower chest: Mild bilateral lower lobe atelectasis. Hepatobiliary: Liver is within normal limits. No suspicious/enhancing hepatic lesions. No hepatic steatosis. Status post cholecystectomy. No intrahepatic or extrahepatic ductal dilatation. Common duct measures 8 mm, within normal limits. No choledocholithiasis is seen. No convincing findings to suggest cholangitis on MR. Pancreas:  Within normal limits. Spleen:  Mild splenomegaly. Adrenals/Urinary Tract:  Adrenal glands are within normal limits. Bilateral renal sinus cysts.  No hydronephrosis. Stomach/Bowel: Stomach is within normal limits. Visualized bowel is unremarkable. Vascular/Lymphatic:  No evidence of abdominal aortic aneurysm. No suspicious abdominal lymphadenopathy. Other:  No abdominal ascites. Musculoskeletal: Lower thoracic/upper lumbar dextroscoliosis with degenerative changes. IMPRESSION: Status post cholecystectomy. No intrahepatic or extrahepatic ductal dilatation. No choledocholithiasis is seen. No findings suspicious for cholangitis on MR. Additional ancillary findings as above. Electronically Signed   By: SJulian Hy M.D.   On: 06/12/2021 23:04    Medications: Scheduled:  sertraline  50 mg Oral Daily   tamsulosin  0.4 mg Oral Daily   traZODone  50 mg Oral QHS   Continuous:  cefTRIAXone (ROCEPHIN)  IV 2 g (06/13/21 2221)   metronidazole 500 mg (06/13/21 2115)    Assessment/Plan: 1) ? Cholangitis. 2) Abnormal liver enzymes.   Clinically the patient is well.  No complaints today.  His liver enzymes are improving and his WBC is also decreasing.  It is not clear why his liver enzymes elevated, but they are trending down.  Plan: 1) Transition to Cipro 500 mg BID 5 more days. 2) Follow up with Dr. AHavery Morosas previously scheduled. 3) Monitor liver panel as an outpatient. 4) Okay to D/C home.  LOS: 1 day   Erice Ahles D 06/14/2021, 8:51 AM

## 2021-06-14 NOTE — Progress Notes (Signed)
Patient discharge teaching given, including activity, diet, follow-up appoints, and medications. Patient verbalized understanding of all discharge instructions. IV access was d/c'd. Vitals are stable. Skin is intact except as charted in most recent assessments. Pt to be escorted out by NT, to be driven home by family. 

## 2021-06-16 ENCOUNTER — Ambulatory Visit: Payer: Medicare HMO | Admitting: Podiatry

## 2021-06-16 ENCOUNTER — Telehealth: Payer: Self-pay

## 2021-06-16 NOTE — Telephone Encounter (Signed)
Transition Care Management Follow-up Telephone Call Date of discharge and from where: [place] 06-14-21 Dx: Ascending cholangitis How have you been since you were released from the hospital? Feeling washed stay  Any questions or concerns? No  Items Reviewed: Did the pt receive and understand the discharge instructions provided? Yes  Medications obtained and verified? Yes  Other? No  Any new allergies since your discharge? No  Dietary orders reviewed? Yes Do you have support at home? Yes   Home Care and Equipment/Supplies: Were home health services ordered? Yes- PT If so, what is the name of the agency? Unsure  Has the agency set up a time to come to the patient's home? yes Were any new equipment or medical supplies ordered?  No What is the name of the medical supply agency? na Were you able to get the supplies/equipment? not applicable Do you have any questions related to the use of the equipment or supplies? No  Functional Questionnaire: (I = Independent and D = Dependent) ADLs: I  Bathing/Dressing- I  Meal Prep- I  Eating- I  Maintaining continence- I  Transferring/Ambulation- I  Managing Meds- I  Follow up appointments reviewed:  PCP Hospital f/u appt confirmed? Yes  Scheduled to see Dr Lorelei Pont  on 06-17-21 @ [facility] f/u appt confirmed? No  . Are transportation arrangements needed? No  If their condition worsens, is the pt aware to call PCP or go to the Emergency Dept.? Yes Was the patient provided with contact information for the PCP's office or ED? Yes Was to pt encouraged to call back with questions or concerns? Yes

## 2021-06-17 ENCOUNTER — Encounter: Payer: Self-pay | Admitting: Family Medicine

## 2021-06-17 ENCOUNTER — Ambulatory Visit (INDEPENDENT_AMBULATORY_CARE_PROVIDER_SITE_OTHER): Payer: Medicare HMO | Admitting: Family Medicine

## 2021-06-17 VITALS — BP 101/54 | HR 75 | Temp 98.2°F | Ht 60.0 in | Wt 135.1 lb

## 2021-06-17 DIAGNOSIS — R7989 Other specified abnormal findings of blood chemistry: Secondary | ICD-10-CM

## 2021-06-17 DIAGNOSIS — M25512 Pain in left shoulder: Secondary | ICD-10-CM | POA: Diagnosis not present

## 2021-06-17 DIAGNOSIS — M249 Joint derangement, unspecified: Secondary | ICD-10-CM | POA: Diagnosis not present

## 2021-06-17 DIAGNOSIS — M25511 Pain in right shoulder: Secondary | ICD-10-CM | POA: Diagnosis not present

## 2021-06-17 DIAGNOSIS — G8929 Other chronic pain: Secondary | ICD-10-CM | POA: Diagnosis not present

## 2021-06-17 LAB — HEPATIC FUNCTION PANEL
ALT: 68 U/L — ABNORMAL HIGH (ref 0–53)
AST: 43 U/L — ABNORMAL HIGH (ref 0–37)
Albumin: 3.6 g/dL (ref 3.5–5.2)
Alkaline Phosphatase: 227 U/L — ABNORMAL HIGH (ref 39–117)
Bilirubin, Direct: 0.7 mg/dL — ABNORMAL HIGH (ref 0.0–0.3)
Total Bilirubin: 1.7 mg/dL — ABNORMAL HIGH (ref 0.2–1.2)
Total Protein: 5.2 g/dL — ABNORMAL LOW (ref 6.0–8.3)

## 2021-06-17 LAB — CULTURE, BLOOD (ROUTINE X 2)
Culture: NO GROWTH
Culture: NO GROWTH
Special Requests: ADEQUATE

## 2021-06-17 NOTE — Progress Notes (Signed)
Harish Bram T. Nakkia Mackiewicz, MD, CAQ Sports Medicine [facility] [address] [place], [address]  Phone: [phone]  FAX: [phone]  [name] - 88 y.o. male  MRN [id number]  Date of Birth: [birth year]  Date: 06/17/2021  PCP: Doreene Nest, NP  Referral: Doreene Nest, NP  Chief Complaint  Patient presents with   Shoulder Pain    Bilateral   Subjective:   [name] is a 88 y.o. very pleasant male patient with Body mass index is 26.39 kg/m. who presents with the following:  B shoulder pain, but also recheck HFP.  He did have cholangitis with recent hospitalization.  He is primary here to see me for left greater than right shoulder pain.  This has been present off and on for years, but he does recall an event in September where he was jumping in and out of a large pickup truck.  At that time he did develop some left-sided shoulder pain.  He is actually already seen a different physician agrees of orthopedics, and they felt like he had a major injury to his shoulder.  By history it sounds like it was felt that he had a rotator cuff repair and he did have some osteoarthritis of the glenohumeral joint.  They felt as though this this was for him not reasonable to address surgically given medical comorbidities and age.  Ongoing since September.  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  Was trying to jump up in some pick-up trucks.   H/o RTC by Dr. Eulah Pont distantly. L side, hears crepitus all the time.   Will do some rehab with home PT - coming today.  Review of Systems is noted in the HPI, as appropriate  Objective:   BP (!) 101/54   Pulse 75   Temp 98.2 F (36.8 C) (Oral)   Ht 5' (1.524 m)   Wt 135 lb 2 oz (61.3 kg)   SpO2 99%   BMI 26.39 kg/m   GEN: No acute distress; alert,appropriate. PULM: Breathing comfortably in no respiratory distress PSYCH: Normally interactive.   Left shoulder flexion to  160, abduction to 150.  Fairly dramatic loss of strength. He does have quite significant crepitus with motion. Strength in the plane of abduction is 3+/5 Flexion is 4 -/5 External rotation is 4/5 Internal rotation is 5/5  Drop test is positive, he also has some pain with Neer testing and Leanord Asal test  Laboratory and Imaging Data:  Assessment and Plan:     ICD-10-CM   1. Derangement of left shoulder joint  M24.9     2. Elevated LFTs  R79.89 Hepatic function panel    3. Chronic pain of both shoulders  M25.511    G89.29    M25.512      At this point, he wanted to proceed conservatively.  He does not have any interest in taking medications, and I cautioned him in using any form of Tylenol with his recent cholangitis.  He reports no fracture or gross abnormality on his prior shoulder x-rays 6 months ago.  Most likely he has a significant rotator cuff tear combined with glenohumeral arthritis.  He also has some thrombocytopenia, so caution with use of NSAIDs or aspirin.  He does have some physical therapy set up, so we will initially do this.  Think that he probably will have some good pain management from an intra-articular injection, but at this point he wants to  hold off and do physical therapy alone.  I think that this is entirely reasonable.  Follow-up with me in 6 to 8 weeks.  This is causing him some significant impairment of function of the left shoulder as well as limited ability to move and exercise with the left upper extremity  Medication Management during today's office visit: No orders of the defined types were placed in this encounter.  Medications Discontinued During This Encounter  Medication Reason   ciprofloxacin (CIPRO) 500 MG tablet     Orders placed today for conditions managed today: Orders Placed This Encounter  Procedures   Hepatic function panel    Follow-up if needed: Return in about 6 weeks (around 07/29/2021) for follow-up with Mrs. Clark  for [facility] hospital follow-up in 1 week, but see Dr. Patsy Lager in 6-7, shoulder.  [employer] One speech-to-text software was used for transcription in this dictation.  Possible transcriptional errors can occur using Animal nutritionist.   Signed,  Elpidio Galea. Davisha Linthicum, MD   Outpatient Encounter Medications as of 06/17/2021  Medication Sig   acetaminophen (TYLENOL) 325 MG tablet Take 650 mg by mouth every 6 (six) hours as needed for mild pain or headache.   Artificial Tear Solution (TEARS NATURALE OP) Place 1-2 drops into both eyes at bedtime as needed (dry eyes).   Ensure (ENSURE) Take 237 mLs by mouth daily. Chocolate   sertraline (ZOLOFT) 25 MG tablet Take 1 tablet (25 mg total) by mouth daily. For depression (Patient taking differently: Take 50 mg by mouth daily.)   tamsulosin (FLOMAX) 0.4 MG CAPS capsule Take 1 capsule (0.4 mg total) by mouth daily. For urine flow   traZODone (DESYREL) 50 MG tablet Take 0.5-1 tablets (25-50 mg total) by mouth at bedtime. For sleep. (Patient taking differently: Take 50 mg by mouth at bedtime.)   [DISCONTINUED] ciprofloxacin (CIPRO) 500 MG tablet Take 1 tablet (500 mg total) by mouth 2 (two) times daily for 5 days. (Patient not taking: Reported on 06/17/2021)   No facility-administered encounter medications on file as of 06/17/2021.

## 2021-06-19 ENCOUNTER — Ambulatory Visit: Payer: Medicare HMO | Admitting: Podiatry

## 2021-06-19 DIAGNOSIS — M79675 Pain in left toe(s): Secondary | ICD-10-CM | POA: Diagnosis not present

## 2021-06-19 DIAGNOSIS — B351 Tinea unguium: Secondary | ICD-10-CM

## 2021-06-19 DIAGNOSIS — M79674 Pain in right toe(s): Secondary | ICD-10-CM | POA: Diagnosis not present

## 2021-06-22 ENCOUNTER — Telehealth: Payer: Self-pay

## 2021-06-22 NOTE — Telephone Encounter (Signed)
Called daughter let her know that you are out of the office today and would be addressing when you get back.

## 2021-06-22 NOTE — Telephone Encounter (Signed)
[name], [place] daughter is calling in advising that Kyley has taken himself of all medications that were prescribed at last visit. Daughter is concerned about this, asking for a returned call as she would like to express concerns prior to the visit on Thursday. Patient has DPR on file from 2017 authorizing okay for [place] to speak with [name].

## 2021-06-23 NOTE — Telephone Encounter (Signed)
Spoke with patient's daughter via phone regarding concerns.  The patient stopped all medications abruptly, date unknown. He's had no adverse effects.   Daughter will be bringing him to his appointment scheduled for this week to discuss other concerns.

## 2021-06-25 ENCOUNTER — Encounter: Payer: Self-pay | Admitting: Primary Care

## 2021-06-25 ENCOUNTER — Ambulatory Visit (INDEPENDENT_AMBULATORY_CARE_PROVIDER_SITE_OTHER): Payer: Medicare HMO | Admitting: Primary Care

## 2021-06-25 DIAGNOSIS — K8309 Other cholangitis: Secondary | ICD-10-CM

## 2021-06-25 DIAGNOSIS — M25512 Pain in left shoulder: Secondary | ICD-10-CM

## 2021-06-25 DIAGNOSIS — F331 Major depressive disorder, recurrent, moderate: Secondary | ICD-10-CM

## 2021-06-25 DIAGNOSIS — G8929 Other chronic pain: Secondary | ICD-10-CM | POA: Diagnosis not present

## 2021-06-25 DIAGNOSIS — M25511 Pain in right shoulder: Secondary | ICD-10-CM

## 2021-06-25 LAB — CBC WITH DIFFERENTIAL/PLATELET
Basophils Absolute: 0.1 10*3/uL (ref 0.0–0.1)
Basophils Relative: 0.7 % (ref 0.0–3.0)
Eosinophils Absolute: 0 10*3/uL (ref 0.0–0.7)
Eosinophils Relative: 0.3 % (ref 0.0–5.0)
HCT: 44.7 % (ref 39.0–52.0)
Hemoglobin: 14.6 g/dL (ref 13.0–17.0)
Lymphocytes Relative: 9.2 % — ABNORMAL LOW (ref 12.0–46.0)
Lymphs Abs: 1.5 10*3/uL (ref 0.7–4.0)
MCHC: 32.6 g/dL (ref 30.0–36.0)
MCV: 82.1 fl (ref 78.0–100.0)
Monocytes Absolute: 3.7 10*3/uL — ABNORMAL HIGH (ref 0.1–1.0)
Monocytes Relative: 23.4 % — ABNORMAL HIGH (ref 3.0–12.0)
Neutro Abs: 10.5 10*3/uL — ABNORMAL HIGH (ref 1.4–7.7)
Neutrophils Relative %: 66.4 % (ref 43.0–77.0)
Platelets: 72 10*3/uL — ABNORMAL LOW (ref 150.0–400.0)
RBC: 5.45 Mil/uL (ref 4.22–5.81)
RDW: 15.9 % — ABNORMAL HIGH (ref 11.5–15.5)
WBC: 15.8 10*3/uL — ABNORMAL HIGH (ref 4.0–10.5)

## 2021-06-25 LAB — HEPATIC FUNCTION PANEL
ALT: 21 U/L (ref 0–53)
AST: 23 U/L (ref 0–37)
Albumin: 3.8 g/dL (ref 3.5–5.2)
Alkaline Phosphatase: 150 U/L — ABNORMAL HIGH (ref 39–117)
Bilirubin, Direct: 0.4 mg/dL — ABNORMAL HIGH (ref 0.0–0.3)
Total Bilirubin: 1.4 mg/dL — ABNORMAL HIGH (ref 0.2–1.2)
Total Protein: 5.6 g/dL — ABNORMAL LOW (ref 6.0–8.3)

## 2021-06-25 NOTE — Assessment & Plan Note (Addendum)
Following with sports medicine.  Continue home health physical therapy.

## 2021-06-25 NOTE — Assessment & Plan Note (Signed)
Uncontrolled.  Long discussion today regarding the fact that he cannot start and discontinue his Zoloft when he feels like it.  He agrees to continue Zoloft 50 mg daily. We will closely monitor.

## 2021-06-25 NOTE — Assessment & Plan Note (Signed)
Recent hospital visit. Hospital notes, labs, imaging reviewed.    Exam today benign. Repeat LFT's.

## 2021-06-25 NOTE — Patient Instructions (Addendum)
Continue sertraline (Zoloft) 50 mg (2 tablets daily) for depression.   Change our appointment with Dr. Lorelei Pont.   Stop by the lab prior to leaving today. I will notify you of your results once received.   [place] is your physical therapist.   It was a pleasure to see you today!

## 2021-06-25 NOTE — Progress Notes (Signed)
Subjective:    Patient ID: [name], male    DOB: 1933-01-24, 88 y.o.   MRN: [id number]  HPI  [name] is a very pleasant 88 y.o. male with a history of depression, anxiety, hyperlipidemia, hypertension, thrombocytopenia, BPH who presents today for follow up of depression and hospital follow up.  His daughter joins [place] today.  He was last evaluated on 06/10/21 for follow up of depression. During this visit he requested Lamisil for his onychomycosis, prior to prescription treatment we obtained a CMP which revealed elevated liver enzymes (Alk phos - 473, ALT - 236, AST - 174).  We obtained a CT abdomen pelvis which revealed cholangitis.  He was sent to the emergency department for further evaluation.  Admitted to [facility] from 06/12/21-06/14/21 for cholangitis.  He was treated with IV antibiotics.  GI consulted who recommended outpatient follow-up.  He was discharged home with ciprofloxacin 500 mg twice daily x5 days and recommendations for GI follow-up. Since his hospital he did not receive a prescription for ciprofloxacin. He denies abdominal pain, fevers, chills.   He stopped taking Flomax a few weeks ago as "I wet my underwear three times". Following with Urology, no recent visit. His daughter mentions that he attended a memorial service and didn't urinate all day despite intake of liquids. He did not take his Flomax the night before.   He also stopped his Zoloft after his visit on 06/10/21 as "it wasn't working". During his visit on 06/10/21 his Zoloft was increased to 50 mg. He resumed his Zoloft yesterday, has been taking 50 mg daily. He continues to feel depressed, feeling down, little motivation doing anything.    Review of Systems  Respiratory:  Negative for shortness of breath.   Cardiovascular:  Negative for chest pain.  Gastrointestinal:  Negative for abdominal pain, constipation, diarrhea and vomiting.  Musculoskeletal:  Positive for arthralgias.   Skin:  Negative for color change.  Psychiatric/Behavioral:  The patient is not nervous/anxious.        See HPI         Past Medical History:  Diagnosis Date   Arthralgia of multiple joints    limited mobility w/  independant adl's   BPH with obstruction/lower urinary tract symptoms    Chronic idiopathic monocytosis    followed by dr Waymon Budge--  per lov note 06/ 2017 persistant unexplained  with normal bone barrow bx   Coronary atherosclerosis of unspecified type of vessel, native or graft    cardiologist-  dr Stanford Breed -- per lov note 11-19-2014 ,  cardiac cath in 2000-- pLAD 60-70%,  small intermediate branch 70-80%,  mRCA 20%,  normal LV   Degenerative arthritis of spine    cervical and lumar   Diverticulosis of colon (without mention of hemorrhage)    Dysuria    chronic   Elevated PSA    Feeling of incomplete bladder emptying    Hiatal hernia    moderate per ct 11/ 2017   History of adenomatous polyp of colon    2008   History of atrial fibrillation    remote hx episode   History of COVID-19 03/05/2020   History of pancreatitis    07-01-2013  and 10-10-2015   History of prostatitis    2014   Hx of colonic polyps    Hyperlipidemia    Hypertension    Mouth sore    roof of mouth sore    Nephrolithiasis    Nocturia more than twice  per night    severe  w/ leakage   OSA (obstructive sleep apnea)    INTOLERANT CPAP   Osteoarthritis    Pernicious anemia    B12  Def.   Peyronie disease    Renal insufficiency    Sepsis ([place]) 03/22/2017   Thrombocytopenia, unspecified ([place]) hemotology/oncologist-  dr Waymon Budge   per dr Velta Addison note 06/ 2016  secondary to vitro clumping   Vitamin B deficiency     Social History   Socioeconomic History   Marital status: Widowed    Spouse name: Not on file   Number of children: 2   Years of education: Not on file   Highest education level: Not on file  Occupational History   Occupation: retired    Fish farm manager:  RETIRED  Tobacco Use   Smoking status: Former    Types: Pipe    Quit date: 01/18/1966    Years since quitting: 55.4   Smokeless tobacco: Never  Vaping Use   Vaping Use: Never used  Substance and Sexual Activity   Alcohol use: No    Alcohol/week: 0.0 standard drinks of alcohol   Drug use: No   Sexual activity: Not Currently  Other Topics Concern   Not on file  Social History Narrative   DNR   Widowed   2 daughters leave near by   Social Determinants of Health   Financial Resource Strain: Not on file  Food Insecurity: Not on file  Transportation Needs: Not on file  Physical Activity: Not on file  Stress: Not on file  Social Connections: Not on file  Intimate Partner Violence: Not on file    Past Surgical History:  Procedure Laterality Date   BONE MARROW BIOPSY  2011   normal   CARDIAC CATHETERIZATION  2000   per dr Kathyrn Drown note --  dLAD 60-70%,  small intermediate branch 70-80%,  mRCA 20%,  normal LV   CARDIOVASCULAR STRESS TEST  01-29-2013   dr Stanford Breed   normal nuclear study w/ no ischemia,  normal LV function and wall motion , ef 82%   CATARACT EXTRACTION W/ INTRAOCULAR LENS  IMPLANT, BILATERAL  08/2015   CHOLECYSTECTOMY  11/24/2010   Procedure: LAPAROSCOPIC CHOLECYSTECTOMY WITH INTRAOPERATIVE CHOLANGIOGRAM;  Surgeon: Earnstine Regal, MD;  Location: [facility];  Service: General;  Laterality: N/A;  c-arm   CYSTOSCOPY W/ URETERAL STENT PLACEMENT Left 12/17/2015   Procedure: CYSTOSCOPY WITH RETROGRADE PYELOGRAM/URETERAL STENT PLACEMENT;  Surgeon: Ardis Hughs, MD;  Location: [facility];  Service: Urology;  Laterality: Left;   CYSTOSCOPY WITH RETROGRADE PYELOGRAM, URETEROSCOPY AND STENT PLACEMENT Left 12/22/2015   Procedure: CYSTOSCOPY WITH LEFT RETROGRADE  URETEROSCOPY AND STENT PLACEMENT;  Surgeon: Carolan Clines, MD;  Location: [facility];  Service: Urology;  Laterality: Left;   ERCP N/A 07/04/2013   Procedure: ENDOSCOPIC RETROGRADE  CHOLANGIOPANCREATOGRAPHY (ERCP);  Surgeon: Gatha Mayer, MD;  Location: [facility];  Service: Endoscopy;  Laterality: N/A;  MAC if available   ERCP N/A 10/12/2015   Procedure: ENDOSCOPIC RETROGRADE CHOLANGIOPANCREATOGRAPHY (ERCP);  Surgeon: [name], MD;  Location: [facility];  Service: Endoscopy;  Laterality: N/A;   HOLMIUM LASER APPLICATION Left 32/04/4008   Procedure: HOLMIUM LASER APPLICATION;  Surgeon: Carolan Clines, MD;  Location: [facility];  Service: Urology;  Laterality: Left;   INGUINAL HERNIA REPAIR Right 01/25/2002   KNEE ARTHROSCOPY  x5   SATURATION BIOPSY OF PROSTATE  05-29-2007  and 01-26-2008   SHOULDER ARTHROSCOPY WITH OPEN ROTATOR CUFF REPAIR  AND DISTAL CLAVICLE ACROMINECTOMY Right 11/19/2004   TOTAL HIP ARTHROPLASTY Right 05/21/2009   TOTAL KNEE ARTHROPLASTY Bilateral left 05-02-2003/  right  03-23-2010   TRANSTHORACIC ECHOCARDIOGRAM  12/23/2004   ef 60%, mild MV calcification without stenosis/  mild TR,  PASP 67mHg   UVULOPALATOPHARYNGOPLASTY  1993    w/  T & A    Family History  Problem Relation Age of Onset   Colon cancer Father 479  Peripheral vascular disease Mother     Allergies  Allergen Reactions   Penicillins Hives and Other (See Comments)    [place]ps, passed out Tolerates cephalosporins  Has patient had a PCN reaction causing immediate rash, facial/tongue/throat swelling, SOB or lightheadedness with hypotension:  yes Has patient had a PCN reaction causing severe rash involving mucus membranes or skin necrosis: no Has patient had a PCN reaction that required hospitalization: no Has patient had a PCN reaction occurring within the last 10 years: no If all of the above answers are "NO", then may proceed with Cephalosporin use.    Tape Hives    PAPER TAPE    Current Outpatient Medications on File Prior to Visit  Medication Sig Dispense Refill   acetaminophen (TYLENOL) 325 MG tablet Take 650 mg by mouth every 6 (six) hours as  needed for mild pain or headache.     Artificial Tear Solution (TEARS NATURALE OP) Place 1-2 drops into both eyes at bedtime as needed (dry eyes).     Ensure (ENSURE) Take 237 mLs by mouth daily. Chocolate     sertraline (ZOLOFT) 25 MG tablet Take 1 tablet (25 mg total) by mouth daily. For depression (Patient taking differently: Take 50 mg by mouth daily.) 90 tablet 0   traZODone (DESYREL) 50 MG tablet Take 0.5-1 tablets (25-50 mg total) by mouth at bedtime. For sleep. (Patient taking differently: Take 50 mg by mouth at bedtime.) 90 tablet 0   tamsulosin (FLOMAX) 0.4 MG CAPS capsule Take 1 capsule (0.4 mg total) by mouth daily. For urine flow (Patient not taking: Reported on 06/25/2021) 90 capsule 3   No current facility-administered medications on file prior to visit.    BP 108/62   Pulse 95   Temp 98.6 F (37 C) (Oral)   Ht 5' (1.524 m)   Wt 132 lb (59.9 kg)   SpO2 98%   BMI 25.78 kg/m  Objective:   Physical Exam Cardiovascular:     Rate and Rhythm: Normal rate and regular rhythm.  Pulmonary:     Effort: Pulmonary effort is normal.     Breath sounds: Normal breath sounds. No wheezing or rales.  Abdominal:     General: Bowel sounds are normal.     Palpations: Abdomen is soft.     Tenderness: There is no abdominal tenderness.  Musculoskeletal:     Cervical back: Neck supple.  Skin:    General: Skin is warm and dry.  Neurological:     Mental Status: He is alert and oriented to person, place, and time.  Psychiatric:        Mood and Affect: Mood normal.           Assessment & Plan:   Problem List Items Addressed This Visit   None      KPleas Koch NP

## 2021-06-30 NOTE — Progress Notes (Signed)
   SUBJECTIVE Patient presents to office today complaining of elongated, thickened nails that cause pain while ambulating in shoes.  Patient is unable to trim their own nails. Patient is here for further evaluation and treatment.  Past Medical History:  Diagnosis Date   Arthralgia of multiple joints    limited mobility w/  independant adl's   BPH with obstruction/lower urinary tract symptoms    Chronic idiopathic monocytosis    followed by dr Waymon Budge--  per lov note 06/ 2017 persistant unexplained  with normal bone barrow bx   Coronary atherosclerosis of unspecified type of vessel, native or graft    cardiologist-  dr Stanford Breed -- per lov note 11-19-2014 ,  cardiac cath in 2000-- pLAD 60-70%,  small intermediate branch 70-80%,  mRCA 20%,  normal LV   Degenerative arthritis of spine    cervical and lumar   Diverticulosis of colon (without mention of hemorrhage)    Dysuria    chronic   Elevated PSA    Feeling of incomplete bladder emptying    Hiatal hernia    moderate per ct 11/ 2017   History of adenomatous polyp of colon    2008   History of atrial fibrillation    remote hx episode   History of COVID-19 03/05/2020   History of pancreatitis    07-01-2013  and 10-10-2015   History of prostatitis    2014   Hx of colonic polyps    Hyperlipidemia    Hypertension    Mouth sore    roof of mouth sore    Nephrolithiasis    Nocturia more than twice per night    severe  w/ leakage   OSA (obstructive sleep apnea)    INTOLERANT CPAP   Osteoarthritis    Pernicious anemia    B12  Def.   Peyronie disease    Renal insufficiency    Sepsis ([place]) 03/22/2017   Thrombocytopenia, unspecified ([place]) hemotology/oncologist-  dr Waymon Budge   per dr Velta Addison note 06/ 2016  secondary to vitro clumping   Vitamin B deficiency     OBJECTIVE General Patient is awake, alert, and oriented x 3 and in no acute distress. Derm Skin is dry and supple bilateral. Negative open lesions or  macerations. Remaining integument unremarkable. Nails are tender, long, thickened and dystrophic with subungual debris, consistent with onychomycosis, 1-5 bilateral. No signs of infection noted. Vasc  DP and PT pedal pulses palpable bilaterally. Temperature gradient within normal limits.  Neuro Epicritic and protective threshold sensation grossly intact bilaterally.  Musculoskeletal Exam No symptomatic pedal deformities noted bilateral. Muscular strength within normal limits.  ASSESSMENT 1.  Pain due to onychomycosis of toenails both  PLAN OF CARE 1. Patient evaluated today.  2. Instructed to maintain good pedal hygiene and foot care.  3. Mechanical debridement of nails 1-5 bilaterally performed using a nail nipper. Filed with dremel without incident.  4. Return to clinic in 3 mos.    Edrick Kins, DPM [facility]  Dr. Edrick Kins, DPM    [address], [place] [address]                Office [phone]  Fax [phone]

## 2021-07-01 ENCOUNTER — Encounter: Payer: Self-pay | Admitting: Family Medicine

## 2021-07-01 ENCOUNTER — Ambulatory Visit (INDEPENDENT_AMBULATORY_CARE_PROVIDER_SITE_OTHER): Payer: Medicare HMO | Admitting: Family Medicine

## 2021-07-01 VITALS — BP 100/60 | HR 86 | Temp 98.1°F | Ht 60.0 in | Wt 133.4 lb

## 2021-07-01 DIAGNOSIS — M25512 Pain in left shoulder: Secondary | ICD-10-CM

## 2021-07-01 DIAGNOSIS — R17 Unspecified jaundice: Secondary | ICD-10-CM

## 2021-07-01 DIAGNOSIS — M249 Joint derangement, unspecified: Secondary | ICD-10-CM

## 2021-07-01 DIAGNOSIS — G8929 Other chronic pain: Secondary | ICD-10-CM | POA: Diagnosis not present

## 2021-07-01 DIAGNOSIS — K8309 Other cholangitis: Secondary | ICD-10-CM | POA: Diagnosis not present

## 2021-07-01 DIAGNOSIS — M25511 Pain in right shoulder: Secondary | ICD-10-CM

## 2021-07-01 DIAGNOSIS — M19011 Primary osteoarthritis, right shoulder: Secondary | ICD-10-CM | POA: Diagnosis not present

## 2021-07-01 DIAGNOSIS — M19012 Primary osteoarthritis, left shoulder: Secondary | ICD-10-CM | POA: Diagnosis not present

## 2021-07-01 MED ORDER — TRIAMCINOLONE ACETONIDE 40 MG/ML IJ SUSP
40.0000 mg | Freq: Once | INTRAMUSCULAR | Status: AC
  Administered 2021-07-01: 40 mg via INTRA_ARTICULAR

## 2021-07-01 NOTE — Progress Notes (Signed)
glenohumeral joints  M19.011    M19.012     3. Derangement of left shoulder joint  M24.9     4. Acute cholangitis  K83.09     5. Elevated bilirubin  R17      Total encounter time: 31 minutes. This includes total time spent on the day of encounter.  Additional time spent on record review as well as discussion of arthritis, but primary discussion was regarding the patient's recent hospitalization, cholangitis, discharge recommendations, follow-up, review of labs including normalization of AST, ALT as well as decreasing bilirubin with the patient's daughter.  In regards to the patient's shoulder, he has severe degenerative joint disease with notable loss  of strength in the plane of abduction and flexion on the left.  Suspect full-thickness rotator cuff tear, and at the same time patient has severe degenerative joint disease of the glenohumeral joint.  Challenging 885, rehab, and for supportive measures we will inject bilateral glenohumeral joints.  Intraarticular Shoulder Aspiration/Injection Procedure Note [name] 1933/03/14 Date of procedure: 07/01/2021  Procedure: Large Joint Aspiration / Injection of Shoulder, Intraarticular, R Indications: Pain  Procedure Details Verbal consent was obtained from the patient. Risks explained and contrasted with benefits and alternatives. Patient prepped with Chloraprep and Ethyl Chloride used for anesthesia. An intraarticular shoulder injection was performed using the posterior approach; needle placed into joint capsule without difficulty. The patient tolerated the procedure well and had decreased pain post injection. No complications. Injection: 9 cc of Lidocaine 1% and 1 mL Kenalog 40 mg. Needle: 21 gauge, 2 inch   Intraarticular Shoulder Aspiration/Injection Procedure Note [name] December 21, 1933 Date of procedure: 07/01/2021  Procedure: Large Joint Aspiration / Injection of Shoulder, Intraarticular, L Indications: Pain  Procedure Details Verbal consent was obtained from the patient. Risks explained and contrasted with benefits and alternatives. Patient prepped with Chloraprep and Ethyl Chloride used for anesthesia. An intraarticular shoulder injection was performed using the posterior approach; needle placed into joint capsule without difficulty. The patient tolerated the procedure well and had decreased pain post injection. No complications. Injection: 9 cc of Lidocaine 1% and 1 mL Kenalog 40 mg. Needle: 21 gauge, 2 inch   Medication Management during today's office visit: Meds ordered this encounter  Medications   triamcinolone acetonide (KENALOG-40) injection 40 mg    triamcinolone acetonide (KENALOG-40) injection 40 mg   Medications Discontinued During This Encounter  Medication Reason   sertraline (ZOLOFT) 25 MG tablet Duplicate    Orders placed today for conditions managed today: No orders of the defined types were placed in this encounter.   Follow-up if needed: No follow-ups on file.  [employer] speech-to-text software was used for transcription in this dictation.  Possible transcriptional errors can occur using Editor, commissioning.   Signed,  Maud Deed. Okechukwu Regnier, MD   Outpatient Encounter Medications as of 07/01/2021  Medication Sig   acetaminophen (TYLENOL) 325 MG tablet Take 650 mg by mouth every 6 (six) hours as needed for mild pain or headache.   Artificial Tear Solution (TEARS NATURALE OP) Place 1-2 drops into both eyes at bedtime as needed (dry eyes).   Ensure (ENSURE) Take 237 mLs by mouth daily. Chocolate   sertraline (ZOLOFT) 25 MG tablet Take 50 mg by mouth daily.   tamsulosin (FLOMAX) 0.4 MG CAPS capsule Take 1 capsule (0.4 mg total) by mouth daily. For urine flow   traZODone (DESYREL) 50 MG tablet Take 0.5-1 tablets (25-50 mg total) by mouth at bedtime. For sleep.   [  Brandon Stranahan T. Alycen Mack, MD, [place] [facility] [place], [address]  Phone: [phone]  FAX: [place] - 88 y.o. male  MRN [id number]  Date of Birth: 11/20/1933  Date: 07/01/2021  PCP: Pleas Koch, NP  Referral: Pleas Koch, NP  Chief Complaint  Patient presents with   Follow-up    Bilateral Shoulder Pain   Subjective:   [name] is a 88 y.o. very pleasant male patient with Body mass index is 26.05 kg/m. who presents with the following:  I saw him 06/17/2021 for fairly severe shoulder pain: The patient is here with his daughter talked about his shoulder pain, and he also has some questions regarding recent hospital adjacent with markedly elevated bilirubin, abnormal liver function test and what sounds like a sending cholangitis.  Last time I saw him, he was open to doing some physical therapy and he is doing this right now.  He does still have some crepitus with motion he has a small degree of loss of motion in bilateral shoulders.  09/2020 Dr. Rhona Raider injected both of his shoulders.  On the patient's CT of the chest with angiography, I do independently review the CT bone windows, and he has advanced degenerative joint disease of the right glenohumeral joint.  The left glenohumeral joint is visualized less, but it does also appear to be advanced from the window that I can see.  Coronal's were reviewed.  Electronically Signed  By: Owens Loffler, MD On: 07/01/2021 12:00 PM EDT   PT is doing ok.  Can hear his arm and move some.    Daughters also has questions regarding his cholangitis as well.  I did my best to review the medical record and answer questions, not being the primary care doctor, and not following him for this issue.  It looks as if the patient have a total bilirubin that peaked at 9.2, but this is progressively getting better, and the last bilirubin that he had was  1.4.  10 days ago, the patient's AST and ALT had returned to normal.  This is down from a peak of 174 on the AST and a peak on the ALT of 236.  All were reviewed with the family.  Review of Systems is noted in the HPI, as appropriate  Objective:   BP 100/60   Pulse 86   Temp 98.1 F (36.7 C) (Oral)   Ht 5' (1.524 m)   Wt 133 lb 6 oz (60.5 kg)   SpO2 96%   BMI 26.05 kg/m   GEN: No acute distress; alert,appropriate. PULM: Breathing comfortably in no respiratory distress PSYCH: Normally interactive.  Patient still has crepitus throughout with motion.  He does lack roughly 15 degrees of motion and terminal abduction and flexion bilaterally. He has fairly dramatic loss of strength bilaterally.  Left is worse than the right Abduction 3+/5 Flexion 4 -/5 External rotation 4/5 Internal range of motion is 5/5  Does have positive drop Test on the left and he also has some pain with his Neer testing and Michel Bickers test, this is worse on the left.  Laboratory and Imaging Data: See my CT review above  Assessment and Plan:     ICD-10-CM   1. Chronic pain of both shoulders  M25.511 triamcinolone acetonide (KENALOG-40) injection 40 mg   G89.29 triamcinolone acetonide (KENALOG-40) injection 40 mg   M25.512     2. Arthritis of both  glenohumeral joints  M19.011    M19.012     3. Derangement of left shoulder joint  M24.9     4. Acute cholangitis  K83.09     5. Elevated bilirubin  R17      Total encounter time: 31 minutes. This includes total time spent on the day of encounter.  Additional time spent on record review as well as discussion of arthritis, but primary discussion was regarding the patient's recent hospitalization, cholangitis, discharge recommendations, follow-up, review of labs including normalization of AST, ALT as well as decreasing bilirubin with the patient's daughter.  In regards to the patient's shoulder, he has severe degenerative joint disease with notable loss  of strength in the plane of abduction and flexion on the left.  Suspect full-thickness rotator cuff tear, and at the same time patient has severe degenerative joint disease of the glenohumeral joint.  Challenging 885, rehab, and for supportive measures we will inject bilateral glenohumeral joints.  Intraarticular Shoulder Aspiration/Injection Procedure Note [name] 1933/03/14 Date of procedure: 07/01/2021  Procedure: Large Joint Aspiration / Injection of Shoulder, Intraarticular, R Indications: Pain  Procedure Details Verbal consent was obtained from the patient. Risks explained and contrasted with benefits and alternatives. Patient prepped with Chloraprep and Ethyl Chloride used for anesthesia. An intraarticular shoulder injection was performed using the posterior approach; needle placed into joint capsule without difficulty. The patient tolerated the procedure well and had decreased pain post injection. No complications. Injection: 9 cc of Lidocaine 1% and 1 mL Kenalog 40 mg. Needle: 21 gauge, 2 inch   Intraarticular Shoulder Aspiration/Injection Procedure Note [name] December 21, 1933 Date of procedure: 07/01/2021  Procedure: Large Joint Aspiration / Injection of Shoulder, Intraarticular, L Indications: Pain  Procedure Details Verbal consent was obtained from the patient. Risks explained and contrasted with benefits and alternatives. Patient prepped with Chloraprep and Ethyl Chloride used for anesthesia. An intraarticular shoulder injection was performed using the posterior approach; needle placed into joint capsule without difficulty. The patient tolerated the procedure well and had decreased pain post injection. No complications. Injection: 9 cc of Lidocaine 1% and 1 mL Kenalog 40 mg. Needle: 21 gauge, 2 inch   Medication Management during today's office visit: Meds ordered this encounter  Medications   triamcinolone acetonide (KENALOG-40) injection 40 mg    triamcinolone acetonide (KENALOG-40) injection 40 mg   Medications Discontinued During This Encounter  Medication Reason   sertraline (ZOLOFT) 25 MG tablet Duplicate    Orders placed today for conditions managed today: No orders of the defined types were placed in this encounter.   Follow-up if needed: No follow-ups on file.  [employer] speech-to-text software was used for transcription in this dictation.  Possible transcriptional errors can occur using Editor, commissioning.   Signed,  Maud Deed. Okechukwu Regnier, MD   Outpatient Encounter Medications as of 07/01/2021  Medication Sig   acetaminophen (TYLENOL) 325 MG tablet Take 650 mg by mouth every 6 (six) hours as needed for mild pain or headache.   Artificial Tear Solution (TEARS NATURALE OP) Place 1-2 drops into both eyes at bedtime as needed (dry eyes).   Ensure (ENSURE) Take 237 mLs by mouth daily. Chocolate   sertraline (ZOLOFT) 25 MG tablet Take 50 mg by mouth daily.   tamsulosin (FLOMAX) 0.4 MG CAPS capsule Take 1 capsule (0.4 mg total) by mouth daily. For urine flow   traZODone (DESYREL) 50 MG tablet Take 0.5-1 tablets (25-50 mg total) by mouth at bedtime. For sleep.   [

## 2021-07-02 DIAGNOSIS — D72829 Elevated white blood cell count, unspecified: Secondary | ICD-10-CM

## 2021-07-02 NOTE — Telephone Encounter (Signed)
Message sent to let know Anda Kraft is out of office but will have Dr. Einar Pheasant review in her absence.

## 2021-07-03 ENCOUNTER — Emergency Department (HOSPITAL_COMMUNITY): Payer: Medicare HMO

## 2021-07-03 ENCOUNTER — Emergency Department (HOSPITAL_COMMUNITY)
Admission: EM | Admit: 2021-07-03 | Discharge: 2021-07-03 | Disposition: A | Discharge status: Home / Self Care | Payer: Medicare HMO | Attending: Emergency Medicine | Admitting: Emergency Medicine

## 2021-07-03 ENCOUNTER — Other Ambulatory Visit: Payer: Self-pay

## 2021-07-03 ENCOUNTER — Encounter (HOSPITAL_COMMUNITY): Payer: Self-pay

## 2021-07-03 DIAGNOSIS — M4319 Spondylolisthesis, multiple sites in spine: Secondary | ICD-10-CM | POA: Diagnosis not present

## 2021-07-03 DIAGNOSIS — Z743 Need for continuous supervision: Secondary | ICD-10-CM | POA: Diagnosis not present

## 2021-07-03 DIAGNOSIS — D72829 Elevated white blood cell count, unspecified: Secondary | ICD-10-CM | POA: Diagnosis not present

## 2021-07-03 DIAGNOSIS — D696 Thrombocytopenia, unspecified: Secondary | ICD-10-CM

## 2021-07-03 DIAGNOSIS — R269 Unspecified abnormalities of gait and mobility: Secondary | ICD-10-CM

## 2021-07-03 DIAGNOSIS — S0990XA Unspecified injury of head, initial encounter: Secondary | ICD-10-CM | POA: Diagnosis not present

## 2021-07-03 DIAGNOSIS — R2689 Other abnormalities of gait and mobility: Secondary | ICD-10-CM | POA: Diagnosis not present

## 2021-07-03 DIAGNOSIS — W01198A Fall on same level from slipping, tripping and stumbling with subsequent striking against other object, initial encounter: Secondary | ICD-10-CM | POA: Insufficient documentation

## 2021-07-03 DIAGNOSIS — M542 Cervicalgia: Secondary | ICD-10-CM | POA: Diagnosis not present

## 2021-07-03 DIAGNOSIS — Z043 Encounter for examination and observation following other accident: Secondary | ICD-10-CM | POA: Diagnosis not present

## 2021-07-03 DIAGNOSIS — M549 Dorsalgia, unspecified: Secondary | ICD-10-CM | POA: Diagnosis not present

## 2021-07-03 DIAGNOSIS — M47812 Spondylosis without myelopathy or radiculopathy, cervical region: Secondary | ICD-10-CM | POA: Diagnosis not present

## 2021-07-03 DIAGNOSIS — I700 Atherosclerosis of aorta: Secondary | ICD-10-CM | POA: Diagnosis not present

## 2021-07-03 DIAGNOSIS — I10 Essential (primary) hypertension: Secondary | ICD-10-CM | POA: Diagnosis not present

## 2021-07-03 DIAGNOSIS — S199XXA Unspecified injury of neck, initial encounter: Secondary | ICD-10-CM | POA: Diagnosis not present

## 2021-07-03 DIAGNOSIS — R000 Tachycardia, unspecified: Secondary | ICD-10-CM | POA: Diagnosis not present

## 2021-07-03 DIAGNOSIS — I2510 Atherosclerotic heart disease of native coronary artery without angina pectoris: Secondary | ICD-10-CM | POA: Diagnosis not present

## 2021-07-03 DIAGNOSIS — W19XXXA Unspecified fall, initial encounter: Secondary | ICD-10-CM

## 2021-07-03 LAB — URINALYSIS, ROUTINE W REFLEX MICROSCOPIC
Bilirubin Urine: NEGATIVE
Glucose, UA: NEGATIVE mg/dL
Hgb urine dipstick: NEGATIVE
Ketones, ur: NEGATIVE mg/dL
Leukocytes,Ua: NEGATIVE
Nitrite: NEGATIVE
Protein, ur: NEGATIVE mg/dL
Specific Gravity, Urine: 1.015 (ref 1.005–1.030)
pH: 6 (ref 5.0–8.0)

## 2021-07-03 LAB — CBC WITH DIFFERENTIAL/PLATELET
Abs Immature Granulocytes: 2.95 10*3/uL — ABNORMAL HIGH (ref 0.00–0.07)
Basophils Absolute: 0.2 10*3/uL — ABNORMAL HIGH (ref 0.0–0.1)
Basophils Relative: 1 %
Eosinophils Absolute: 0 10*3/uL (ref 0.0–0.5)
Eosinophils Relative: 0 %
HCT: 49.9 % (ref 39.0–52.0)
Hemoglobin: 16.5 g/dL (ref 13.0–17.0)
Immature Granulocytes: 8 %
Lymphocytes Relative: 3 %
Lymphs Abs: 1.2 10*3/uL (ref 0.7–4.0)
MCH: 27.3 pg (ref 26.0–34.0)
MCHC: 33.1 g/dL (ref 30.0–36.0)
MCV: 82.6 fL (ref 80.0–100.0)
Monocytes Absolute: 4.5 10*3/uL — ABNORMAL HIGH (ref 0.1–1.0)
Monocytes Relative: 12 %
Neutro Abs: 28.1 10*3/uL — ABNORMAL HIGH (ref 1.7–7.7)
Neutrophils Relative %: 76 %
Platelets: 68 10*3/uL — ABNORMAL LOW (ref 150–400)
RBC: 6.04 MIL/uL — ABNORMAL HIGH (ref 4.22–5.81)
RDW: 15.5 % (ref 11.5–15.5)
WBC: 36.9 10*3/uL — ABNORMAL HIGH (ref 4.0–10.5)
nRBC: 0 % (ref 0.0–0.2)

## 2021-07-03 LAB — COMPREHENSIVE METABOLIC PANEL
ALT: 19 U/L (ref 0–44)
AST: 27 U/L (ref 15–41)
Albumin: 4.2 g/dL (ref 3.5–5.0)
Alkaline Phosphatase: 105 U/L (ref 38–126)
Anion gap: 10 (ref 5–15)
BUN: 19 mg/dL (ref 8–23)
CO2: 25 mmol/L (ref 22–32)
Calcium: 9.1 mg/dL (ref 8.9–10.3)
Chloride: 108 mmol/L (ref 98–111)
Creatinine, Ser: 1.01 mg/dL (ref 0.61–1.24)
GFR, Estimated: >60 mL/min (ref >60)
Glucose, Bld: 117 mg/dL — ABNORMAL HIGH (ref 70–99)
Potassium: 4.1 mmol/L (ref 3.5–5.1)
Sodium: 143 mmol/L (ref 135–145)
Total Bilirubin: 1.7 mg/dL — ABNORMAL HIGH (ref 0.3–1.2)
Total Protein: 6.2 g/dL — ABNORMAL LOW (ref 6.5–8.1)

## 2021-07-03 LAB — PATHOLOGIST SMEAR REVIEW

## 2021-07-03 LAB — CK: Total CK: 106 U/L (ref 49–397)

## 2021-07-03 MED ORDER — SODIUM CHLORIDE 0.9 % IV SOLN: INTRAVENOUS | Status: DC

## 2021-07-03 NOTE — Telephone Encounter (Signed)
I received note this morning to triage pt and when I opened chart pt is already at [facility] ED. Sending note to Dr Einar Pheasant as Juluis Rainier.

## 2021-07-03 NOTE — ED Notes (Signed)
Pt ambulated in the hall with assistance. Shuffle in the gate but pretty steady

## 2021-07-03 NOTE — Telephone Encounter (Signed)
Noted, pt in the ER after being found outside his home from a fall for 10+ hours.   Leukocytosis is significantly worse.   Will need ER f/u with Anda Kraft.

## 2021-07-03 NOTE — Discharge Instructions (Signed)
Be careful when walking, to prevent falling.  Always use your cane.  Try to eat 3 meals a day and drink plenty of fluids.  Follow-up with the GI doctor as planned for further evaluation of the episode of jaundice.  Consider seeing a hematologist regarding the high white blood cell count.  It is likely elevated today because the stress of lying on the ground.  There are no signs of sepsis or other infection at this time.

## 2021-07-03 NOTE — ED Notes (Signed)
Pt tolerated meal bag and drink without complications.

## 2021-07-03 NOTE — ED Provider Notes (Signed)
[facility]ERGENC[facility] DEPARTMENT Provider Note   CSN: [id number] Arrival date & time: 07/03/21  0831     History {Add pertinent medical, surgical, social history, OB history to HPI:1} Chief Complaint  Patient presents with  . [name] is a 88 y.o. male.  HPI Patient found on ground outside his house, this morning around 8 AM.  He apparently went outside 8 PM last night and fell striking his forehead on the ground.  He was unable to get up because of weakness.  He was found by neighbors, who called EMS who subsequently transferred him here with a cervical collar in place.  His daughters have arrived they were notified by neighbors.  Patient denies any recent illnesses.  He is unsure why he fell last night.  Patient is hungry and thirsty.    Home Medications Prior to Admission medications   Medication Sig Start Date End Date Taking? Authorizing Provider  acetaminophen (TYLENOL) 325 MG tablet Take 650 mg by mouth every 6 (six) hours as needed for mild pain or headache.    [provider]  Artificial Tear Solution (TEARS NATURALE OP) Place 1-2 drops into both eyes at bedtime as needed (dry eyes).    [provider]  Ensure (ENSURE) Take 237 mLs by mouth daily. Chocolate    [provider]  sertraline (ZOLOFT) 25 MG tablet Take 50 mg by mouth daily.    [provider]  tamsulosin (FLOMAX) 0.4 MG CAPS capsule Take 1 capsule (0.4 mg total) by mouth daily. For urine flow 06/05/21   Pleas Koch, NP  traZODone (DESYREL) 50 MG tablet Take 0.5-1 tablets (25-50 mg total) by mouth at bedtime. For sleep. 06/10/21   Pleas Koch, NP      Allergies    Penicillins and Tape    Review of Systems   Review of Systems  Physical Exam Updated Vital Signs BP 120/71   Pulse 82   Temp 97.6 F (36.4 C) (Oral)   Resp 15   Ht 5' (1.524 m)   Wt 59.9 kg   SpO2 99%   BMI 25.78 kg/m  Physical Exam Vitals and nursing note  reviewed.  Constitutional:      General: He is not in acute distress.    Appearance: He is well-developed. He is not ill-appearing or toxic-appearing.  HENT:     Head: Normocephalic.     Comments: Contusions and abrasion forehead, not bleeding.  No associated crepitation or deformity.    Right Ear: External ear normal.     Left Ear: External ear normal.  Eyes:     Conjunctiva/sclera: Conjunctivae normal.     Pupils: Pupils are equal, round, and reactive to light.  Neck:     Trachea: Phonation normal.     Comments: Wearing cervical collar applied by EMS. Cardiovascular:     Rate and Rhythm: Normal rate and regular rhythm.     Heart sounds: Normal heart sounds.  Pulmonary:     Effort: Pulmonary effort is normal.     Breath sounds: Normal breath sounds.  Abdominal:     General: There is no distension.     Palpations: Abdomen is soft.     Tenderness: There is no abdominal tenderness.  Musculoskeletal:        General: No swelling or tenderness. Normal range of motion.     Cervical back: Normal range of motion.     Comments: No large joint deformities.  Able to independently elevate arms and legs off the stretcher.  Skin:    General: Skin is warm and dry.  Neurological:     Mental Status: He is alert and oriented to person, place, and time.     Cranial Nerves: No cranial nerve deficit.     Sensory: No sensory deficit.     Motor: No abnormal muscle tone.     Coordination: Coordination normal.     Comments: No dysarthria or aphasia.  Grip strength right hand slightly less than left hand.  Otherwise symmetric strength.  Psychiatric:        Mood and Affect: Mood normal.        Behavior: Behavior normal.        Thought Content: Thought content normal.        Judgment: Judgment normal.    ED Results / Procedures / Treatments   Labs (all labs ordered are listed, but only abnormal results are displayed) Labs Reviewed  COMPREHENSIVE METABOLIC PANEL - Abnormal; Notable for the  following components:      Result Value   Glucose, Bld 117 (*)    Total Protein 6.2 (*)    Total Bilirubin 1.7 (*)    All other components within normal limits  CBC WITH DIFFERENTIAL/PLATELET - Abnormal; Notable for the following components:   WBC 36.9 (*)    RBC 6.04 (*)    Platelets 68 (*)    Neutro Abs 28.1 (*)    Monocytes Absolute 4.5 (*)    Basophils Absolute 0.2 (*)    Abs Immature Granulocytes 2.95 (*)    All other components within normal limits  URINALYSIS, ROUTINE W REFLEX MICROSCOPIC - Abnormal; Notable for the following components:   Color, Urine AMBER (*)    All other components within normal limits  CK  PATHOLOGIST SMEAR REVIEW    EKG EKG Interpretation  Date/Time:  Friday July 03 2021 08:53:05 EDT Ventricular Rate:  90 PR Interval:  172 QRS Duration: 119 QT Interval:  408 QTC Calculation: 500 R Axis:   -73 Text Interpretation: Sinus rhythm Incomplete RBBB and LAFB since last tracing no significant change Confirmed by Daleen Bo [phone]) on 07/03/2021 10:59:31 AM  Radiology CT Head Wo Contrast  Result Date: 07/03/2021 CLINICAL DATA:  Polytrauma, blunt EXAM: CT HEAD WITHOUT CONTRAST CT CERVICAL SPINE WITHOUT CONTRAST TECHNIQUE: Multidetector CT imaging of the head and cervical spine was performed following the standard protocol without intravenous contrast. Multiplanar CT image reconstructions of the cervical spine were also generated. RADIATION DOSE REDUCTION: This exam was performed according to the departmental dose-optimization program which includes automated exposure control, adjustment of the mA and/or kV according to patient size and/or use of iterative reconstruction technique. COMPARISON:  02/14/2021 FINDINGS: CT HEAD FINDINGS Brain: No evidence of acute infarction, hemorrhage, hydrocephalus, extra-axial collection or mass lesion/mass effect. Patchy low-density changes within the periventricular and subcortical white matter compatible with chronic  microvascular ischemic change. Mild-moderate diffuse cerebral volume loss. Vascular: Atherosclerotic calcifications involving the large vessels of the skull base. No unexpected hyperdense vessel. Skull: Normal. Negative for fracture or focal lesion. Sinuses/Orbits: No acute finding. Other: Negative for scalp hematoma. CT CERVICAL SPINE FINDINGS Alignment: Facet joints are aligned without dislocation or traumatic listhesis. Dens and lateral masses are aligned. Degenerative grade 1 anterolisthesis of C2 on C3 and C7 on T1, unchanged. Skull base and vertebrae: No acute fracture. No primary bone lesion or focal pathologic process. Soft tissues and spinal canal: No prevertebral fluid or swelling. No visible  canal hematoma. Disc levels: Advanced multilevel degenerative disc disease and facet arthropathy throughout the cervical spine, not appreciably changed from prior. Upper chest: Included lung apices are clear. Other: Aortic atherosclerosis. IMPRESSION: 1. No acute intracranial abnormality. 2. No acute cervical spine fracture or traumatic listhesis. Electronically Signed   By: Davina Poke D.O.   On: 07/03/2021 10:18   CT Cervical Spine Wo Contrast  Result Date: 07/03/2021 CLINICAL DATA:  Polytrauma, blunt EXAM: CT HEAD WITHOUT CONTRAST CT CERVICAL SPINE WITHOUT CONTRAST TECHNIQUE: Multidetector CT imaging of the head and cervical spine was performed following the standard protocol without intravenous contrast. Multiplanar CT image reconstructions of the cervical spine were also generated. RADIATION DOSE REDUCTION: This exam was performed according to the departmental dose-optimization program which includes automated exposure control, adjustment of the mA and/or kV according to patient size and/or use of iterative reconstruction technique. COMPARISON:  02/14/2021 FINDINGS: CT HEAD FINDINGS Brain: No evidence of acute infarction, hemorrhage, hydrocephalus, extra-axial collection or mass lesion/mass effect.  Patchy low-density changes within the periventricular and subcortical white matter compatible with chronic microvascular ischemic change. Mild-moderate diffuse cerebral volume loss. Vascular: Atherosclerotic calcifications involving the large vessels of the skull base. No unexpected hyperdense vessel. Skull: Normal. Negative for fracture or focal lesion. Sinuses/Orbits: No acute finding. Other: Negative for scalp hematoma. CT CERVICAL SPINE FINDINGS Alignment: Facet joints are aligned without dislocation or traumatic listhesis. Dens and lateral masses are aligned. Degenerative grade 1 anterolisthesis of C2 on C3 and C7 on T1, unchanged. Skull base and vertebrae: No acute fracture. No primary bone lesion or focal pathologic process. Soft tissues and spinal canal: No prevertebral fluid or swelling. No visible canal hematoma. Disc levels: Advanced multilevel degenerative disc disease and facet arthropathy throughout the cervical spine, not appreciably changed from prior. Upper chest: Included lung apices are clear. Other: Aortic atherosclerosis. IMPRESSION: 1. No acute intracranial abnormality. 2. No acute cervical spine fracture or traumatic listhesis. Electronically Signed   By: Davina Poke D.O.   On: 07/03/2021 10:18   DG Chest 2 View  Result Date: 07/03/2021 CLINICAL DATA:  fall EXAM: CHEST - 2 VIEW COMPARISON:  February 05, 2020 FINDINGS: The heart size and mediastinal contours are within normal limits. Thoracic aorta is ectatic. Atheromatous calcifications at the arch of the aorta. Both lungs are clear without consolidation, pleural effusion or pneumothorax. Moderately severe degenerative changes of the shoulder joints greater on the left, stable. IMPRESSION: Lungs are stable with no active cardiopulmonary disease. Electronically Signed   By: Frazier Richards M.D.   On: 07/03/2021 10:00    Procedures Procedures  {Document cardiac monitor, telemetry assessment procedure when appropriate:1}  Medications  Ordered in ED Medications  0.9 %  sodium chloride infusion ( Intravenous New Bag/Given 07/03/21 0943)    ED Course/ Medical Decision Making/ A&P                           Medical Decision Making Plan ground-level fall striking head.  He is not on anticoagulants.  He presents to the ED after lying outside on the ground all night.  He is at his baseline, per family members at the bedside  Amount and/or Complexity of Data Reviewed Independent Historian:     Details: Cogent historian.  Daughters at bedside concur with his history.  They state that he is getting in-home PT, once a week.  Apparently this was started by his PCP.  Patient is a poor eater, does not like  to cook because of the mass it makes and likely not eating regularly.  Daughter states that sometimes he drinks Ensure.  They state that he continues to drive.  They have not yet followed up with GI, following recent hospitalization.  Patient is apparently seeing a hematologist remotely. External Data Reviewed: labs and notes.    Details: He was hospitalized 5/26 to 06/14/2021, evaluated and treated for leukocytosis.  At that time he was diagnosed with ascending cholangitis, treated with antibiotic medication and discharged on Cipro.  He was recommended to GI follow-up as an outpatient. Labs: ordered. Radiology: ordered and independent interpretation performed.    Details: CT head and cervical spine, chest x-ray-no acute abnormalities.  No evidence for stroke, pneumonia, heart failure ECG/medicine tests: ordered and independent interpretation performed.    Details: Cardiac monitor-normal sinus rhythm Discussion of management or test interpretation with external provider(s): Consultation TTS for arrangements for home health.  Risk Prescription drug management. Decision regarding hospitalization. Risk Details: Patient with fall, laid on the ground for multiple hours overnight.  No evidence for rhabdomyolysis.  Fall likely due to balance  problems.  No serious injuries were found.  Patient clinically well, while at rest.  Vital signs are reassuring.  Laboratory testing shows markedly elevated white count likely stress related following lying on the ground for 12 hours.  Recent elevation of white count and transaminases with episode of a sending cholangeitis which was treated as an inpatient.  At this point those parameters are much improved with just minimal residual elevation of total bilirubin.  This may actually be up because of mild dehydration.  Patient does not need further inpatient treatment for cholangitis or hospitalization for leukocytosis.  Being that this is likely stress related leukocytosis, I am recommending repeat white count in 3 to 5 days.  This can be done by his PCP as an outpatient.  Recommending follow-up with GI and possibly hematology for ongoing management of multiple problems.  Family members relate that the patient is living alone and not necessarily thriving.  He is continuing to drive which is not advisable at this point.  I have asked for home health services with multiple pronged analysis and intervention.  Patient does not require hospitalization today.   ***  {Document critical care time when appropriate:1} {Document review of labs and clinical decision tools ie heart score, Chads2Vasc2 etc:1}  {Document your independent review of radiology images, and any outside records:1} {Document your discussion with family members, caretakers, and with consultants:1} {Document social determinants of health affecting pt's care:1} {Document your decision making why or why not admission, treatments were needed:1} Final Clinical Impression(s) / ED Diagnoses Final diagnoses:  None    Rx / DC Orders ED Discharge Orders     None

## 2021-07-03 NOTE — ED Triage Notes (Signed)
Pt BIBA from home. Pt reports falling in the grass last night around 2000. Pt found this AM by neighbors. Hs of Scoliosis. Pt denies LOC. C/o back and neck pain at this time. Small abrasion to top of L forehead.

## 2021-07-03 NOTE — Telephone Encounter (Signed)
Please call to triage patient.    Overall I agree with Kate's plan to recheck in a couple of weeks at his follow-up visit.   However - please have family monitor for signs of infection   - fever - night sweats - confusion - urinary symptoms - cold symptoms - diarrhea - etc  If he is experiencing new symptoms another office visit sooner would be recommended. Otherwise, recheck is appropriate

## 2021-07-06 ENCOUNTER — Telehealth: Payer: Self-pay

## 2021-07-06 NOTE — Telephone Encounter (Signed)
I also responded to a MyChart question about his white blood cells.  I have ordered future testing to be done.  I am happy to review those lab results without him being seen in the office since he does have close follow-up with his PCP.  However does seem reasonable for him to have an appointment with somebody if they choose to keep that.  Agree with home health orders as requested.  Agree with commendation for walk in psychiatric care given his current symptoms

## 2021-07-06 NOTE — Telephone Encounter (Signed)
Melissa OT with [facility] requesting verbal orders for [facility] OT 1 x a wk for 2 wks and 2 x a wk for 2 wks. Then reassessment would be done. Melissa said pt has had 3 falls in last 10 days; pt seen [facility] due to fall on 07/03/21. Melissa also said when she asked pt if he was depressed pt said if he could get his hands on Trazodone pt would end this nightmare. Pts daughter was with pt also. Melissa said evidently since pt lost his wife and then lost his brother in Jan and 2 friends in last few months pt is feeling depressed and voiced SI. I spoke with [name] (DPR signed)  [name] has the trazodone and pt does not have access to the medication. [name] does believe pt needs counseling and would like referral to someone for counseling and also wants pt seen this wk for FU WBC from when seen ED on 07/03/21 at [facility] ED.Gentry Fitz NP is out of office this wk and [name] asked for Dr Lorelei Pont who will also be out of office the rest of this week. [name] has removed med from pts home and also has removed hand gun from pts home. Pt does live alone and due to recent falls tryingt to get pt an alert button in case he has urgent need.I spoke with Dr Glori Bickers who does not have an in office appt but Dr Glori Bickers said tell [name] to take pt to [facility] today that an appt is not required and then FU with medication questions when Anda Kraft returns. I spoke back with [name] and gave her contact info and address for [facility] [address] and phone [phone]. [name] did not believe there was a Immunologist in [place]. I called [facility] and was advised near [address]. [name] said she would google that she does have some idea where that is located but she cannot take her dad there today because he is exhausted and [name] will take pt to [facility] on 07/07/21. Advised if pt condition changed or worsened SI/HI she could also take pt to ED. [name] voiced  understanding and said pt already has appt with Gentry Fitz NP on 07/15/21 and will discuss med for pt that will not make him so sleepy. [name] said she was advised by ED to have pt WBC repeated in 3 - 5 days when seen 07/03/21 at ED. [name] said cannot wait until appt with Anda Kraft for that. [name] scheduled appt with Dr Einar Pheasant on 07/10/21 at 11 AM with Dr Einar Pheasant to FU WBC. I offered sooner appts but none would work with [employer] schedule. Sending note to Gentry Fitz NP, Dr Einar Pheasant and Lavina Hamman CMA.

## 2021-07-06 NOTE — Telephone Encounter (Signed)
Just want to verify with you after recent ED visit that you are ok with him waiting to be seen when Anda Kraft is back in office.

## 2021-07-06 NOTE — Addendum Note (Signed)
Addended by: Waunita Schooner R on: 07/06/2021 04:02 PM   Modules accepted: Orders

## 2021-07-07 NOTE — Telephone Encounter (Signed)
Called spoke to daughter please recent open message for all documentation.

## 2021-07-07 NOTE — Telephone Encounter (Signed)
Spoke to [name] I have moved visit on 6/23 from follow up with [facility] to lab only they would like to have referral started. They did not go to walk in for evaluation. Patient refused to go to walking for evaluation. She does not feel like he is a danger to himself in any way at this time. Advised that if they feel like he becomes a danger that they can call 911 to have transported to ed. They have agreed to keep follow up with Anda Kraft once she is back in office.

## 2021-07-09 ENCOUNTER — Encounter: Payer: Self-pay | Admitting: Internal Medicine

## 2021-07-10 ENCOUNTER — Ambulatory Visit: Payer: Medicare HMO | Admitting: Family Medicine

## 2021-07-10 ENCOUNTER — Encounter: Payer: Self-pay | Admitting: Internal Medicine

## 2021-07-10 ENCOUNTER — Telehealth: Payer: Self-pay | Admitting: Family Medicine

## 2021-07-10 ENCOUNTER — Other Ambulatory Visit (INDEPENDENT_AMBULATORY_CARE_PROVIDER_SITE_OTHER): Payer: Medicare HMO

## 2021-07-10 DIAGNOSIS — H52223 Regular astigmatism, bilateral: Secondary | ICD-10-CM | POA: Diagnosis not present

## 2021-07-10 DIAGNOSIS — D72829 Elevated white blood cell count, unspecified: Secondary | ICD-10-CM

## 2021-07-10 DIAGNOSIS — H16223 Keratoconjunctivitis sicca, not specified as Sjogren's, bilateral: Secondary | ICD-10-CM | POA: Diagnosis not present

## 2021-07-10 DIAGNOSIS — Z9842 Cataract extraction status, left eye: Secondary | ICD-10-CM | POA: Diagnosis not present

## 2021-07-10 DIAGNOSIS — H04123 Dry eye syndrome of bilateral lacrimal glands: Secondary | ICD-10-CM | POA: Diagnosis not present

## 2021-07-10 DIAGNOSIS — Z9841 Cataract extraction status, right eye: Secondary | ICD-10-CM | POA: Diagnosis not present

## 2021-07-10 LAB — CBC WITH DIFFERENTIAL/PLATELET
Basophils Absolute: 0.2 10*3/uL — ABNORMAL HIGH (ref 0.0–0.1)
Basophils Relative: 0.7 % (ref 0.0–3.0)
Eosinophils Absolute: 0.1 10*3/uL (ref 0.0–0.7)
Eosinophils Relative: 0.5 % (ref 0.0–5.0)
HCT: 44.9 % (ref 39.0–52.0)
Hemoglobin: 14.9 g/dL (ref 13.0–17.0)
Lymphocytes Relative: 6.4 % — ABNORMAL LOW (ref 12.0–46.0)
Lymphs Abs: 1.5 10*3/uL (ref 0.7–4.0)
MCHC: 33.2 g/dL (ref 30.0–36.0)
MCV: 81.6 fl (ref 78.0–100.0)
Monocytes Absolute: 5.9 10*3/uL — ABNORMAL HIGH (ref 0.1–1.0)
Monocytes Relative: 25.7 % — ABNORMAL HIGH (ref 3.0–12.0)
Neutro Abs: 15.2 10*3/uL — ABNORMAL HIGH (ref 1.4–7.7)
Neutrophils Relative %: 66.7 % (ref 43.0–77.0)
Platelets: 44 10*3/uL — CL (ref 150.0–400.0)
RBC: 5.5 Mil/uL (ref 4.22–5.81)
RDW: 16.6 % — ABNORMAL HIGH (ref 11.5–15.5)
WBC: 22.9 10*3/uL (ref 4.0–10.5)

## 2021-07-10 NOTE — Telephone Encounter (Signed)
Verified with daughter on [place]. Patient has not had any improvement. Still having a lot of fatigue. They have not heard from hematology yet. Daughter will go by after work and check on him. Family feels like they are in limbo and not sure how to help dad. Do they need to go back to ED for faster evaluation from hematology?

## 2021-07-10 NOTE — Telephone Encounter (Signed)
I think he should go back to ER if clinically worse- fever, inc in pain, dehydration, etc.    His labs have improved (dec in LFTs and WBC).  I would see what they hear from hematology about the referral on Monday.    Thanks.

## 2021-07-10 NOTE — Telephone Encounter (Signed)
Received a call from [facility] lab, with critical results.  WBC 22.9 Platelet count of 44  Results given to Dr. Para March at [facility]

## 2021-07-13 ENCOUNTER — Telehealth: Payer: Self-pay | Admitting: Hematology and Oncology

## 2021-07-13 NOTE — Telephone Encounter (Signed)
If available would recommend acute visit today or tomorrow.   Appt with Jae Dire is [place]

## 2021-07-13 NOTE — Telephone Encounter (Signed)
Patient's daughter is calling in stating they have not got a response back from Hematology. Daughter is concerned because she said Omarion found a tick on him last night and believes it had been there since the fall last week, states there is a site the size of of a quarter and the tick was pretty engorged looking, Keldrick denies having any side effects.

## 2021-07-14 NOTE — Telephone Encounter (Signed)
Looks like there is an 11:20 am slot available for today. Otherwise I can see him tomorrow as scheduled.

## 2021-07-14 NOTE — Telephone Encounter (Signed)
Called patient daughter they would like to keep appointment tomorrow. Due to transportation declined to move to today.

## 2021-07-15 ENCOUNTER — Other Ambulatory Visit: Payer: Self-pay | Admitting: Primary Care

## 2021-07-15 ENCOUNTER — Telehealth: Payer: Self-pay

## 2021-07-15 ENCOUNTER — Other Ambulatory Visit: Payer: Self-pay

## 2021-07-15 ENCOUNTER — Ambulatory Visit (INDEPENDENT_AMBULATORY_CARE_PROVIDER_SITE_OTHER): Payer: Medicare HMO | Admitting: Primary Care

## 2021-07-15 ENCOUNTER — Encounter: Payer: Self-pay | Admitting: Primary Care

## 2021-07-15 VITALS — BP 120/64 | HR 78 | Temp 97.8°F | Ht 60.0 in | Wt 128.0 lb

## 2021-07-15 DIAGNOSIS — D72829 Elevated white blood cell count, unspecified: Secondary | ICD-10-CM | POA: Diagnosis not present

## 2021-07-15 DIAGNOSIS — D696 Thrombocytopenia, unspecified: Secondary | ICD-10-CM

## 2021-07-15 DIAGNOSIS — F419 Anxiety disorder, unspecified: Secondary | ICD-10-CM

## 2021-07-15 DIAGNOSIS — K8309 Other cholangitis: Secondary | ICD-10-CM | POA: Diagnosis not present

## 2021-07-15 DIAGNOSIS — R410 Disorientation, unspecified: Secondary | ICD-10-CM

## 2021-07-15 DIAGNOSIS — E538 Deficiency of other specified B group vitamins: Secondary | ICD-10-CM | POA: Diagnosis not present

## 2021-07-15 DIAGNOSIS — R3912 Poor urinary stream: Secondary | ICD-10-CM

## 2021-07-15 DIAGNOSIS — A6920 Lyme disease, unspecified: Secondary | ICD-10-CM | POA: Diagnosis not present

## 2021-07-15 DIAGNOSIS — N401 Enlarged prostate with lower urinary tract symptoms: Secondary | ICD-10-CM | POA: Diagnosis not present

## 2021-07-15 DIAGNOSIS — R82998 Other abnormal findings in urine: Secondary | ICD-10-CM | POA: Diagnosis not present

## 2021-07-15 DIAGNOSIS — D510 Vitamin B12 deficiency anemia due to intrinsic factor deficiency: Secondary | ICD-10-CM | POA: Diagnosis not present

## 2021-07-15 DIAGNOSIS — R972 Elevated prostate specific antigen [PSA]: Secondary | ICD-10-CM

## 2021-07-15 DIAGNOSIS — R296 Repeated falls: Secondary | ICD-10-CM

## 2021-07-15 LAB — CBC WITH DIFFERENTIAL/PLATELET
Basophils Absolute: 0.2 10*3/uL — ABNORMAL HIGH (ref 0.0–0.1)
Basophils Relative: 0.8 % (ref 0.0–3.0)
Eosinophils Absolute: 0.2 10*3/uL (ref 0.0–0.7)
Eosinophils Relative: 0.8 % (ref 0.0–5.0)
HCT: 47.1 % (ref 39.0–52.0)
Hemoglobin: 15.3 g/dL (ref 13.0–17.0)
Lymphocytes Relative: 7.7 % — ABNORMAL LOW (ref 12.0–46.0)
Lymphs Abs: 1.7 10*3/uL (ref 0.7–4.0)
MCHC: 32.6 g/dL (ref 30.0–36.0)
MCV: 82 fl (ref 78.0–100.0)
Monocytes Absolute: 6.3 10*3/uL — ABNORMAL HIGH (ref 0.1–1.0)
Monocytes Relative: 29.7 % — ABNORMAL HIGH (ref 3.0–12.0)
Neutro Abs: 13.1 10*3/uL — ABNORMAL HIGH (ref 1.4–7.7)
Neutrophils Relative %: 61 % (ref 43.0–77.0)
Platelets: 31 10*3/uL — CL (ref 150.0–400.0)
RBC: 5.75 Mil/uL (ref 4.22–5.81)
RDW: 17.1 % — ABNORMAL HIGH (ref 11.5–15.5)
WBC: 21.4 10*3/uL (ref 4.0–10.5)

## 2021-07-15 LAB — HEPATIC FUNCTION PANEL
ALT: 11 U/L (ref 0–53)
AST: 14 U/L (ref 0–37)
Albumin: 4.2 g/dL (ref 3.5–5.2)
Alkaline Phosphatase: 92 U/L (ref 39–117)
Bilirubin, Direct: 0.3 mg/dL (ref 0.0–0.3)
Total Bilirubin: 1.2 mg/dL (ref 0.2–1.2)
Total Protein: 5.8 g/dL — ABNORMAL LOW (ref 6.0–8.3)

## 2021-07-15 LAB — VITAMIN B12: Vitamin B-12: 811 pg/mL (ref 211–911)

## 2021-07-15 MED ORDER — TAMSULOSIN HCL 0.4 MG PO CAPS: 0.8000 mg | ORAL_CAPSULE | Freq: Every day | ORAL | 0 refills | Status: DC

## 2021-07-15 MED ORDER — DOXYCYCLINE HYCLATE 100 MG PO TABS: 100.0000 mg | ORAL_TABLET | Freq: Two times a day (BID) | ORAL | 0 refills | Status: DC

## 2021-07-15 MED ORDER — CYANOCOBALAMIN 1000 MCG/ML IJ SOLN
1000.0000 ug | Freq: Once | INTRAMUSCULAR | Status: AC
  Administered 2021-07-15: 1000 ug via INTRAMUSCULAR

## 2021-07-15 NOTE — Assessment & Plan Note (Signed)
Follow-up with GI tomorrow as scheduled. Repeat LFTs and CBC pending.

## 2021-07-15 NOTE — Progress Notes (Signed)
Subjective:    Patient ID: [name], male    DOB: 06/04/1933, 88 y.o.   MRN: [id number]  HPI  [name] is a very pleasant 88 y.o. male with a history of hypertension, dysphagia, GERD, ascending cholangitis, confusion, recurrent falls, BPH, elevated PSA, chronic shoulder pain, depression thrombocytopenia who presents today for ED follow up.  Both of his daughters joined [place] today.  He presented to [facility] ED on 07/03/2021 for a fall.  He was found on the ground outside of his home that morning by neighbors.  The evening prior he went outside, fell, struck his head and forehead on the ground, unable to get up because of weakness.  Paramedics for called who transported patient to ED.  During his stay in the ED he underwent lab work which continue to show leukocytosis, significantly higher than previous CBC, stable platelet count.  Liver enzymes have returned to baseline.  His fall was considered to be secondary to imbalance and mild dehydration.  He was discharged home later that morning with recommendations for GI follow-up, hematology follow-up.  Since his hospital visit CBC was rechecked last week which showed improvement in leukocytosis. He found an engorged tick to his left medial upper thigh three days ago, thinks the tick attached to him while laying in the yard. The ED provider did not see the tick. Since then he's developed a red and blue ring rash to the site with tenderness and warmth.   He is visited by PT at least weekly. He has been advised to start using his walker for which he has not done. He has been using his cane. He is not doing his PT exercises outside of his therapists's visit.   He continues to experience nocturia and urinary urgency during the night. He is getting up 4-5 times nightly on average despite compliance to his Flomax nightly.  Previously following with urology, has not followed back up despite recommendations.  He has an appointment with GI scheduled for  tomorrow and hematology scheduled for Friday.   His family is concerned about potential dementia. Symptoms include forgetfulness, agitation, repetitive questioning, acute confusion especially as the sun goes down. He is not taking some medications or eating, but he will tell his family that he does.  His family was notified of a medication that can help with memory, they question if he should be initiated on the medication.   Review of Systems  Constitutional:  Positive for fatigue. Negative for fever.  Respiratory:  Negative for shortness of breath.   Cardiovascular:  Negative for chest pain.  Gastrointestinal:  Negative for abdominal pain.  Musculoskeletal:  Positive for arthralgias.  Skin:  Positive for rash.  Psychiatric/Behavioral:  Positive for confusion. The patient is nervous/anxious.          Past Medical History:  Diagnosis Date   Arthralgia of multiple joints    limited mobility w/  independant adl's   BPH with obstruction/lower urinary tract symptoms    Chronic idiopathic monocytosis    followed by dr Waymon Budge--  per lov note 06/ 2017 persistant unexplained  with normal bone barrow bx   Coronary atherosclerosis of unspecified type of vessel, native or graft    cardiologist-  dr Stanford Breed -- per lov note 11-19-2014 ,  cardiac cath in 2000-- pLAD 60-70%,  small intermediate branch 70-80%,  mRCA 20%,  normal LV   Degenerative arthritis of spine    cervical and lumar   Diverticulosis of colon (without mention  of hemorrhage)    Dysuria    chronic   Elevated PSA    Feeling of incomplete bladder emptying    Hiatal hernia    moderate per ct 11/ 2017   History of adenomatous polyp of colon    2008   History of atrial fibrillation    remote hx episode   History of COVID-19 03/05/2020   History of pancreatitis    07-01-2013  and 10-10-2015   History of prostatitis    2014   Hx of colonic polyps    Hyperlipidemia    Hypertension    Mouth sore    roof of mouth sore     Nephrolithiasis    Nocturia more than twice per night    severe  w/ leakage   OSA (obstructive sleep apnea)    INTOLERANT CPAP   Osteoarthritis    Pernicious anemia    B12  Def.   Peyronie disease    Renal insufficiency    Sepsis ([place]) 03/22/2017   Thrombocytopenia, unspecified ([place]) hemotology/oncologist-  dr Waymon Budge   per dr Velta Addison note 06/ 2016  secondary to vitro clumping   Vitamin B deficiency     Social History   Socioeconomic History   Marital status: Widowed    Spouse name: Not on file   Number of children: 2   Years of education: Not on file   Highest education level: Not on file  Occupational History   Occupation: retired    Fish farm manager: RETIRED  Tobacco Use   Smoking status: Former    Types: Pipe    Quit date: 01/18/1966    Years since quitting: 55.5   Smokeless tobacco: Never  Vaping Use   Vaping Use: Never used  Substance and Sexual Activity   Alcohol use: No    Alcohol/week: 0.0 standard drinks of alcohol   Drug use: No   Sexual activity: Not Currently  Other Topics Concern   Not on file  Social History Narrative   DNR   Widowed   2 daughters leave near by   Social Determinants of Health   Financial Resource Strain: Not on file  Food Insecurity: Not on file  Transportation Needs: Not on file  Physical Activity: Not on file  Stress: Not on file  Social Connections: Not on file  Intimate Partner Violence: Not on file    Past Surgical History:  Procedure Laterality Date   BONE MARROW BIOPSY  2011   normal   CARDIAC CATHETERIZATION  2000   per dr Kathyrn Drown note --  dLAD 60-70%,  small intermediate branch 70-80%,  mRCA 20%,  normal LV   CARDIOVASCULAR STRESS TEST  01-29-2013   dr Stanford Breed   normal nuclear study w/ no ischemia,  normal LV function and wall motion , ef 82%   CATARACT EXTRACTION W/ INTRAOCULAR LENS  IMPLANT, BILATERAL  08/2015   CHOLECYSTECTOMY  11/24/2010   Procedure: LAPAROSCOPIC CHOLECYSTECTOMY WITH  INTRAOPERATIVE CHOLANGIOGRAM;  Surgeon: Earnstine Regal, MD;  Location: [facility];  Service: General;  Laterality: N/A;  c-arm   CYSTOSCOPY W/ URETERAL STENT PLACEMENT Left 12/17/2015   Procedure: CYSTOSCOPY WITH RETROGRADE PYELOGRAM/URETERAL STENT PLACEMENT;  Surgeon: Ardis Hughs, MD;  Location: [facility];  Service: Urology;  Laterality: Left;   CYSTOSCOPY WITH RETROGRADE PYELOGRAM, URETEROSCOPY AND STENT PLACEMENT Left 12/22/2015   Procedure: CYSTOSCOPY WITH LEFT RETROGRADE  URETEROSCOPY AND STENT PLACEMENT;  Surgeon: Carolan Clines, MD;  Location: [facility];  Service: Urology;  Laterality: Left;  ERCP N/A 07/04/2013   Procedure: ENDOSCOPIC RETROGRADE CHOLANGIOPANCREATOGRAPHY (ERCP);  Surgeon: Gatha Mayer, MD;  Location: [facility];  Service: Endoscopy;  Laterality: N/A;  MAC if available   ERCP N/A 10/12/2015   Procedure: ENDOSCOPIC RETROGRADE CHOLANGIOPANCREATOGRAPHY (ERCP);  Surgeon: [name], MD;  Location: [facility];  Service: Endoscopy;  Laterality: N/A;   HOLMIUM LASER APPLICATION Left 86/03/8175   Procedure: HOLMIUM LASER APPLICATION;  Surgeon: Carolan Clines, MD;  Location: [facility];  Service: Urology;  Laterality: Left;   INGUINAL HERNIA REPAIR Right 01/25/2002   KNEE ARTHROSCOPY  x5   SATURATION BIOPSY OF PROSTATE  05-29-2007  and 01-26-2008   SHOULDER ARTHROSCOPY WITH OPEN ROTATOR CUFF REPAIR AND DISTAL CLAVICLE ACROMINECTOMY Right 11/19/2004   TOTAL HIP ARTHROPLASTY Right 05/21/2009   TOTAL KNEE ARTHROPLASTY Bilateral left 05-02-2003/  right  03-23-2010   TRANSTHORACIC ECHOCARDIOGRAM  12/23/2004   ef 60%, mild MV calcification without stenosis/  mild TR,  PASP 28mHg   UVULOPALATOPHARYNGOPLASTY  1993    w/  T & A    Family History  Problem Relation Age of Onset   Colon cancer Father 435  Peripheral vascular disease Mother     Allergies  Allergen Reactions   Penicillins Hives and Other (See Comments)    Whelps, passed  out Tolerates cephalosporins  Has patient had a PCN reaction causing immediate rash, facial/tongue/throat swelling, SOB or lightheadedness with hypotension:  yes Has patient had a PCN reaction causing severe rash involving mucus membranes or skin necrosis: no Has patient had a PCN reaction that required hospitalization: no Has patient had a PCN reaction occurring within the last 10 years: no If all of the above answers are "NO", then may proceed with Cephalosporin use.    Tape Hives    PAPER TAPE    Current Outpatient Medications on File Prior to Visit  Medication Sig Dispense Refill   acetaminophen (TYLENOL) 325 MG tablet Take 650 mg by mouth every 6 (six) hours as needed for mild pain or headache.     Artificial Tear Solution (TEARS NATURALE OP) Place 1-2 drops into both eyes at bedtime as needed (dry eyes).     Ensure (ENSURE) Take 237 mLs by mouth daily. Chocolate     sertraline (ZOLOFT) 25 MG tablet Take 50 mg by mouth daily.     traZODone (DESYREL) 50 MG tablet Take 0.5-1 tablets (25-50 mg total) by mouth at bedtime. For sleep. 90 tablet 0   No current facility-administered medications on file prior to visit.    BP 120/64   Pulse 78   Temp 97.8 F (36.6 C) (Oral)   Ht 5' (1.524 m)   Wt 128 lb (58.1 kg)   SpO2 97%   BMI 25.00 kg/m  Objective:   Physical Exam Cardiovascular:     Rate and Rhythm: Normal rate and regular rhythm.  Pulmonary:     Effort: Pulmonary effort is normal.     Breath sounds: Normal breath sounds. No wheezing or rales.  Musculoskeletal:     Cervical back: Neck supple.  Skin:    General: Skin is warm and dry.     Findings: Erythema and rash present.     Comments: 4-5 cm diameter area of erythema and bluish discoloration with central opening from tick bite.  No drainage.  Tender and warm.  Neurological:     Mental Status: He is alert and oriented to person, place, and time.  Psychiatric:  Mood and Affect: Mood normal.            Assessment & Plan:   Problem List Items Addressed This Visit       Digestive   Ascending cholangitis    Follow-up with GI tomorrow as scheduled. Repeat LFTs and CBC pending.        Nervous and Auditory   Confusion    It is possible that he may be experiencing some mild dementia, however he continues to hold a normal conversation and answer questions appropriately.  Consider neurology referral for formal testing.        Musculoskeletal and Integument   Erythema migrans (Lyme disease)    Examination today strongly suggestive of potential tickborne illness, especially in the setting of leukocytosis.  Start doxycycline 100 mg twice daily x10 days. We will closely follow.      Relevant Medications   doxycycline (VIBRA-TABS) 100 MG tablet   Other Relevant Orders   CBC with Differential/Platelet     Genitourinary   BPH (benign prostatic hyperplasia)    Uncontrolled despite tamsulosin 0.4 mg daily. He, his family and I had a long discussion regarding the need to follow-up with urology, especially given his elevated PSA levels.  Increase tamsulosin to 0.8 mg daily. He will schedule an appointment with his urologist.      Relevant Medications   tamsulosin (FLOMAX) 0.4 MG CAPS capsule     Hematopoietic and Hemostatic   Thrombocytopenia (HCC) (Chronic)    Follow-up with hematology later this week as scheduled.        Other   ANEMIA, PERNICIOUS (Chronic)    Repeat vitamin B12 level pending today. Vitamin B12 1000 mcg IM provided today.      Elevated PSA, less than 10 ng/ml    Also with LUTS.  Increasing tamsulosin to 0.8 mg daily. He will follow-up with urology.      Chronic anxiety    Persists.  Continue sertraline 25 mg daily for now. Continue to closely monitor.      Recurrent falls while walking    Continue home health physical therapy. Strongly advised to utilize his walker as recommended by physical therapy and Occupational Therapy.      Other  Visit Diagnoses     B12 deficiency    -  Primary   Relevant Medications   cyanocobalamin ((VITAMIN B-12)) injection 1,000 mcg (Completed)   Other Relevant Orders   Vitamin B12   Leukocytosis, unspecified type       Acute cholangitis       Relevant Orders   Hepatic function panel          Pleas Koch, NP

## 2021-07-15 NOTE — Telephone Encounter (Signed)
[name] is calling in from [facility], they faxed over a plan of care on the 15th and has yet to hear back. Asked if we have a copy or if they need to re-fax.

## 2021-07-15 NOTE — Assessment & Plan Note (Signed)
Uncontrolled despite tamsulosin 0.4 mg daily. He, his family and I had a long discussion regarding the need to follow-up with urology, especially given his elevated PSA levels.  Increase tamsulosin to 0.8 mg daily. He will schedule an appointment with his urologist.

## 2021-07-15 NOTE — Assessment & Plan Note (Signed)
Repeat vitamin B12 level pending today. Vitamin B12 1000 mcg IM provided today.

## 2021-07-15 NOTE — Assessment & Plan Note (Signed)
It is possible that he may be experiencing some mild dementia, however he continues to hold a normal conversation and answer questions appropriately.  Consider neurology referral for formal testing.

## 2021-07-15 NOTE — Telephone Encounter (Signed)
Noted  

## 2021-07-15 NOTE — Assessment & Plan Note (Signed)
Persists.  Continue sertraline 25 mg daily for now. Continue to closely monitor.

## 2021-07-15 NOTE — Assessment & Plan Note (Signed)
Follow-up with hematology later this week as scheduled.

## 2021-07-15 NOTE — Patient Instructions (Addendum)
Stop by the lab prior to leaving today. I will notify you of your results once received.   Start Doxycycline antibiotic for the infection. Take 1 tablet by mouth twice daily for 10 days.  We increased the dose of your Flomax (urine flow) to 2 capsules once daily. Please set up an appointment with your Urologist.   Please set up a follow up visit for 2-4 weeks.   It was a pleasure to see you today!

## 2021-07-15 NOTE — Assessment & Plan Note (Signed)
Also with LUTS.  Increasing tamsulosin to 0.8 mg daily. He will follow-up with urology.

## 2021-07-15 NOTE — Telephone Encounter (Signed)
[name] from [facility] lab called critical results. White count 21.4 Platelet 31 Information given to [employer].

## 2021-07-15 NOTE — Assessment & Plan Note (Signed)
Examination today strongly suggestive of potential tickborne illness, especially in the setting of leukocytosis.  Start doxycycline 100 mg twice daily x10 days. We will closely follow.

## 2021-07-15 NOTE — Assessment & Plan Note (Signed)
Continue home health physical therapy. Strongly advised to utilize his walker as recommended by physical therapy and Occupational Therapy.

## 2021-07-16 ENCOUNTER — Ambulatory Visit: Payer: Medicare HMO | Admitting: Internal Medicine

## 2021-07-16 ENCOUNTER — Encounter: Payer: Self-pay | Admitting: Internal Medicine

## 2021-07-16 VITALS — BP 102/62 | HR 77 | Ht 60.0 in | Wt 129.0 lb

## 2021-07-16 DIAGNOSIS — K642 Third degree hemorrhoids: Secondary | ICD-10-CM | POA: Diagnosis not present

## 2021-07-16 DIAGNOSIS — R17 Unspecified jaundice: Secondary | ICD-10-CM

## 2021-07-16 DIAGNOSIS — R7989 Other specified abnormal findings of blood chemistry: Secondary | ICD-10-CM

## 2021-07-16 LAB — URINALYSIS, ROUTINE W REFLEX MICROSCOPIC
Bilirubin Urine: NEGATIVE
Hgb urine dipstick: NEGATIVE
Ketones, ur: NEGATIVE
Nitrite: NEGATIVE
RBC / HPF: NONE SEEN (ref 0–?)
Specific Gravity, Urine: 1.025 (ref 1.000–1.030)
Urine Glucose: NEGATIVE
Urobilinogen, UA: 0.2 (ref 0.0–1.0)
pH: 5.5 (ref 5.0–8.0)

## 2021-07-16 LAB — URINE CULTURE
MICRO NUMBER:: 13583659
SPECIMEN QUALITY:: ADEQUATE

## 2021-07-16 NOTE — Patient Instructions (Addendum)
It was good to see you today.    I appreciate the opportunity to care for you. Silvano Rusk, MD, [facility]

## 2021-07-16 NOTE — Progress Notes (Signed)
[name] 88 y.o. 03-06-1933 [id number]  Assessment & Plan:   Encounter Diagnoses  Name Primary?   Abnormal LFTs Yes   Jaundice    Grade III hemorrhoids by history      He is jaundice and abnormal LFTs have resolved.  No further work-up at this time monitor for recurrence and image as appropriate then.  It certainly sounds like he has a grade 3 hemorrhoid.  He does not want any intervention or evaluation.  Follow-up here as needed  CC: Brandon Koch, [name]   Subjective:   Chief Complaint: Prior jaundice question cholangitis  HPI [name] is here with his daughter, after being hospitalized in late May with jaundice and abnormal liver tests and question of cholangitis.  That issue was raised by CT scan but then an MRCP did not show any findings consistent with cholangitis.  He has a history of choledocholithiasis and cholangitis issues in the past and found an ERCP and has had stones removed but then at other times it has been investigated in the ERCPs have been negative.  At any rate he was treated with antibiotics he went home and his LFTs are entirely normal and he does not have any symptoms related to that.  I am not sure he had much symptoms other than jaundice when he was hospitalized.  More recently he fell and spent the day or overnight in the yard and had a tick bite.  There are some questions about memory disturbance.  He is going to see hematology for a significant leukocytosis and worsening of chronic thrombocytopenia tomorrow.  He is not interested in any procedures.  He describes a 5-year history of a prolapsing hemorrhoid that he has to reduce manually.  He says he is embarrassed about this and he declines any examination.  I have reviewed MRCP CT reports GI notes and consultation in the hospital discharge summary from this may admission I have also reviewed more recent labs in the EMR and recent primary care visit. Allergies  Allergen Reactions    Penicillins Hives and Other (See Comments)    Whelps, passed out Tolerates cephalosporins  Has patient had a PCN reaction causing immediate rash, facial/tongue/throat swelling, SOB or lightheadedness with hypotension:  yes Has patient had a PCN reaction causing severe rash involving mucus membranes or skin necrosis: no Has patient had a PCN reaction that required hospitalization: no Has patient had a PCN reaction occurring within the last 10 years: no If all of the above answers are "NO", then may proceed with Cephalosporin use.    Tape Hives    PAPER TAPE    Current Outpatient Medications:    acetaminophen (TYLENOL) 325 MG tablet, Take 650 mg by mouth every 6 (six) hours as needed for mild pain or headache., Disp: , Rfl:    Artificial Tear Solution (TEARS NATURALE OP), Place 1-2 drops into both eyes at bedtime as needed (dry eyes)., Disp: , Rfl:    doxycycline (VIBRA-TABS) 100 MG tablet, Take 1 tablet (100 mg total) by mouth 2 (two) times daily., Disp: 20 tablet, Rfl: 0   Ensure (ENSURE), Take 237 mLs by mouth daily. Chocolate, Disp: , Rfl:    sertraline (ZOLOFT) 25 MG tablet, Take 50 mg by mouth daily., Disp: , Rfl:    tamsulosin (FLOMAX) 0.4 MG CAPS capsule, Take 2 capsules (0.8 mg total) by mouth daily. For urine flow, Disp: 180 capsule, Rfl: 0   traZODone (DESYREL) 50 MG tablet, Take 0.5-1 tablets (  25-50 mg total) by mouth at bedtime. For sleep., Disp: 90 tablet, Rfl: 0  Past Medical History:  Diagnosis Date   Arthralgia of multiple joints    limited mobility w/  independant adl's   BPH with obstruction/lower urinary tract symptoms    Chronic idiopathic monocytosis    followed by [name]--  per lov note 06/ 2017 persistant unexplained  with normal bone barrow bx   Coronary atherosclerosis of unspecified type of vessel, native or graft    cardiologist-  [name] -- per lov note 11-19-2014 ,  cardiac cath in 2000-- pLAD 60-70%,  small intermediate branch 70-80%,  mRCA  20%,  normal LV   Degenerative arthritis of spine    cervical and lumar   Diverticulosis of colon (without mention of hemorrhage)    Dysuria    chronic   Elevated PSA    Feeling of incomplete bladder emptying    Hiatal hernia    moderate per ct 11/ 2017   History of adenomatous polyp of colon    2008   History of atrial fibrillation    remote hx episode   History of COVID-19 03/05/2020   History of pancreatitis    07-01-2013  and 10-10-2015   History of prostatitis    2014   Hx of colonic polyps    Hyperlipidemia    Hypertension    Mouth sore    roof of mouth sore    Nephrolithiasis    Nocturia more than twice per night    severe  w/ leakage   OSA (obstructive sleep apnea)    INTOLERANT CPAP   Osteoarthritis    Pernicious anemia    B12  Def.   Peyronie disease    Renal insufficiency    Sepsis ([name]) 03/22/2017   Thrombocytopenia, unspecified ([name]) hemotology/oncologist-  [name]   per [name] note 06/ 2016  secondary to vitro clumping   Vitamin B deficiency    Past Surgical History:  Procedure Laterality Date   BONE MARROW BIOPSY  2011   normal   CARDIAC CATHETERIZATION  2000   per [name] note --  dLAD 60-70%,  small intermediate branch 70-80%,  mRCA 20%,  normal LV   CARDIOVASCULAR STRESS TEST  01-29-2013   [name]   normal nuclear study w/ no ischemia,  normal LV function and wall motion , ef 82%   CATARACT EXTRACTION W/ INTRAOCULAR LENS  IMPLANT, BILATERAL  08/2015   CHOLECYSTECTOMY  11/24/2010   Procedure: LAPAROSCOPIC CHOLECYSTECTOMY WITH INTRAOPERATIVE CHOLANGIOGRAM;  Surgeon: Brandon Regal, [name];  Location: [facility];  Service: General;  Laterality: N/A;  c-arm   CYSTOSCOPY W/ URETERAL STENT PLACEMENT Left 12/17/2015   Procedure: CYSTOSCOPY WITH RETROGRADE PYELOGRAM/URETERAL STENT PLACEMENT;  Surgeon: Brandon Hughs, [name];  Location: [facility];  Service: Urology;  Laterality: Left;   CYSTOSCOPY WITH RETROGRADE PYELOGRAM, URETEROSCOPY  AND STENT PLACEMENT Left 12/22/2015   Procedure: CYSTOSCOPY WITH LEFT RETROGRADE  URETEROSCOPY AND STENT PLACEMENT;  Surgeon: Brandon Clines, [name];  Location: [facility];  Service: Urology;  Laterality: Left;   ERCP N/A 07/04/2013   Procedure: ENDOSCOPIC RETROGRADE CHOLANGIOPANCREATOGRAPHY (ERCP);  Surgeon: Gatha Mayer, [name];  Location: [facility];  Service: Endoscopy;  Laterality: N/A;  MAC if available   ERCP N/A 10/12/2015   Procedure: ENDOSCOPIC RETROGRADE CHOLANGIOPANCREATOGRAPHY (ERCP);  Surgeon: [name], [name];  [facility]: [facility];  Service: Endoscopy;  Laterality: N/A;   HOLMIUM LASER APPLICATION Left 53/09/7671   Procedure:  HOLMIUM LASER APPLICATION;  Surgeon: Brandon Clines, [name];  Location: [facility];  Service: Urology;  Laterality: Left;   INGUINAL HERNIA REPAIR Right 01/25/2002   KNEE ARTHROSCOPY  x5   SATURATION BIOPSY OF PROSTATE  05-29-2007  and 01-26-2008   SHOULDER ARTHROSCOPY WITH OPEN ROTATOR CUFF REPAIR AND DISTAL CLAVICLE ACROMINECTOMY Right 11/19/2004   TOTAL HIP ARTHROPLASTY Right 05/21/2009   TOTAL KNEE ARTHROPLASTY Bilateral left 05-02-2003/  right  03-23-2010   TRANSTHORACIC ECHOCARDIOGRAM  12/23/2004   ef 60%, mild MV calcification without stenosis/  mild TR,  PASP 26mHg   UVULOPALATOPHARYNGOPLASTY  1993    w/  T & A   Social History   Social History Narrative   DNR   Widowed   2 daughters leave near by   family history includes Breast cancer in his daughter; Colon cancer (age of onset: 427 in his father; Peripheral vascular disease in his mother.   Review of Systems As above  Objective:   Physical Exam BP 102/62   Pulse 77   Ht 5' (1.524 m)   Wt 129 lb (58.5 kg)   BMI 25.19 kg/m  Abdomen slightly protuberant soft and nontender

## 2021-07-17 ENCOUNTER — Encounter: Payer: Self-pay | Admitting: Oncology

## 2021-07-17 ENCOUNTER — Inpatient Hospital Stay: Payer: Medicare HMO | Attending: Oncology | Admitting: Oncology

## 2021-07-17 ENCOUNTER — Inpatient Hospital Stay: Payer: Medicare HMO

## 2021-07-17 VITALS — BP 133/44 | HR 50 | Temp 98.1°F | Resp 18 | Wt 129.4 lb

## 2021-07-17 DIAGNOSIS — D72829 Elevated white blood cell count, unspecified: Secondary | ICD-10-CM | POA: Insufficient documentation

## 2021-07-17 DIAGNOSIS — Z803 Family history of malignant neoplasm of breast: Secondary | ICD-10-CM | POA: Insufficient documentation

## 2021-07-17 DIAGNOSIS — D72821 Monocytosis (symptomatic): Secondary | ICD-10-CM | POA: Insufficient documentation

## 2021-07-17 DIAGNOSIS — Z79899 Other long term (current) drug therapy: Secondary | ICD-10-CM | POA: Insufficient documentation

## 2021-07-17 DIAGNOSIS — R5383 Other fatigue: Secondary | ICD-10-CM

## 2021-07-17 DIAGNOSIS — D696 Thrombocytopenia, unspecified: Secondary | ICD-10-CM | POA: Diagnosis not present

## 2021-07-17 DIAGNOSIS — Z9181 History of falling: Secondary | ICD-10-CM

## 2021-07-17 DIAGNOSIS — Z800 Family history of malignant neoplasm of digestive organs: Secondary | ICD-10-CM | POA: Diagnosis not present

## 2021-07-17 DIAGNOSIS — Z87891 Personal history of nicotine dependence: Secondary | ICD-10-CM

## 2021-07-17 LAB — TSH: TSH: 0.917 u[IU]/mL (ref 0.350–4.500)

## 2021-07-17 LAB — VITAMIN B12: Vitamin B-12: 1691 pg/mL — ABNORMAL HIGH (ref 180–914)

## 2021-07-17 LAB — CBC WITH DIFFERENTIAL/PLATELET
Abs Immature Granulocytes: 0.76 10*3/uL — ABNORMAL HIGH (ref 0.00–0.07)
Basophils Absolute: 0.1 10*3/uL (ref 0.0–0.1)
Basophils Relative: 1 %
Eosinophils Absolute: 0.1 10*3/uL (ref 0.0–0.5)
Eosinophils Relative: 1 %
HCT: 48 % (ref 39.0–52.0)
Hemoglobin: 15.3 g/dL (ref 13.0–17.0)
Immature Granulocytes: 6 %
Lymphocytes Relative: 10 %
Lymphs Abs: 1.3 10*3/uL (ref 0.7–4.0)
MCH: 27.3 pg (ref 26.0–34.0)
MCHC: 31.9 g/dL (ref 30.0–36.0)
MCV: 85.6 fL (ref 80.0–100.0)
Monocytes Absolute: 3.3 10*3/uL — ABNORMAL HIGH (ref 0.1–1.0)
Monocytes Relative: 25 %
Neutro Abs: 7.6 10*3/uL (ref 1.7–7.7)
Neutrophils Relative %: 57 %
Platelets: 38 10*3/uL — ABNORMAL LOW (ref 150–400)
RBC: 5.61 MIL/uL (ref 4.22–5.81)
RDW: 16.9 % — ABNORMAL HIGH (ref 11.5–15.5)
Smear Review: NORMAL
WBC: 13.2 10*3/uL — ABNORMAL HIGH (ref 4.0–10.5)
nRBC: 0 % (ref 0.0–0.2)

## 2021-07-17 LAB — COMPREHENSIVE METABOLIC PANEL
ALT: 13 U/L (ref 0–44)
AST: 17 U/L (ref 15–41)
Albumin: 3.9 g/dL (ref 3.5–5.0)
Alkaline Phosphatase: 76 U/L (ref 38–126)
Anion gap: 8 (ref 5–15)
BUN: 13 mg/dL (ref 8–23)
CO2: 29 mmol/L (ref 22–32)
Calcium: 8.8 mg/dL — ABNORMAL LOW (ref 8.9–10.3)
Chloride: 105 mmol/L (ref 98–111)
Creatinine, Ser: 1.11 mg/dL (ref 0.61–1.24)
GFR, Estimated: >60 mL/min (ref >60)
Glucose, Bld: 111 mg/dL — ABNORMAL HIGH (ref 70–99)
Potassium: 4.6 mmol/L (ref 3.5–5.1)
Sodium: 142 mmol/L (ref 135–145)
Total Bilirubin: 1.1 mg/dL (ref 0.3–1.2)
Total Protein: 5.7 g/dL — ABNORMAL LOW (ref 6.5–8.1)

## 2021-07-17 LAB — IMMATURE PLATELET FRACTION: Immature Platelet Fraction: 11.3 % — ABNORMAL HIGH (ref 1.2–8.6)

## 2021-07-17 LAB — LACTATE DEHYDROGENASE: LDH: 135 U/L (ref 98–192)

## 2021-07-17 LAB — FOLATE: Folate: 19.1 ng/mL (ref >5.9)

## 2021-07-17 LAB — PATHOLOGIST SMEAR REVIEW

## 2021-07-17 NOTE — Telephone Encounter (Signed)
I have signed everything that has come across my inbox. Joellen, do have a copy of this faxed?

## 2021-07-17 NOTE — Progress Notes (Unsigned)
patient here to establish care for leukocytosis. Per daughter pt is currently on antibiotic due to tic bite.

## 2021-07-18 LAB — HIV ANTIBODY (ROUTINE TESTING W REFLEX): HIV Screen 4th Generation wRfx: NONREACTIVE

## 2021-07-18 NOTE — Progress Notes (Signed)
Hematology/Oncology Consult note [phone] [phone]         Patient Care Team: Pleas Koch, NP as PCP - General (Internal Medicine) Annia Belt, MD as Consulting Physician (Oncology) Carolan Clines, MD (Inactive) as Consulting Physician (Urology) Lafayette Dragon, MD (Inactive) as Consulting Physician (Gastroenterology) Lelon Perla, MD as Consulting Physician (Cardiology) Lloyd Huger, MD as Consulting Physician (Oncology)  REFERRING PROVIDER: Pleas Koch, NP  CHIEF COMPLAINTS/REASON FOR VISIT:  Evaluation of thrombocytopenia and leukocytosis.   HISTORY OF PRESENTING ILLNESS:   [name] is a  88 y.o.  male with PMH listed below was seen in consultation at the request of  Pleas Koch, NP  for evaluation of thrombocytopenia and leukocytosis.   He has a chronic history of thrombocytopenia, unexplained monocytosis.previously seen by hematology remotely.   10/30/2009 bone marrow biopsy 1. Bone Marrow, Aspirate,Biopsy, and Clot, left PIC :   - HYPERCELLULAR MARROW WITH TRILINEAGE HEMATOPOIESIS.  PERIPHERAL BLOOD:   - THROMBOCYTOPENIA AND SLIGHT MONOCYTOSIS.   He was accompanied by his twin daughters [name], and [name]. Patient lives alone, he gets help from his daughters. Patient is a poor historian + fatigue, decreased appetite and some weight loss.  + confusion.  No active bleeding events.  06/12/2024-06/14/2021  evaluated and treated for leukocytosis.  At that time he was diagnosed with ascending cholangitis, treated with antibiotic medication and discharged on Cipro. He had outpatient follow up with GI and his LFT has resolved.  07/03/21 He recently found on ground outside his house, after a fall due to balancing problems. He was evaluated at ER.  Recently started on Doxycycline for tic prophylaxis.     Review of Systems  Constitutional:  Positive for appetite change and fatigue. Negative for  chills and fever.  HENT:   Negative for hearing loss and voice change.   Eyes:  Negative for eye problems.  Respiratory:  Negative for chest tightness and cough.   Cardiovascular:  Negative for chest pain.  Gastrointestinal:  Negative for abdominal distention, abdominal pain and blood in stool.  Endocrine: Negative for hot flashes.  Genitourinary:  Negative for difficulty urinating and frequency.   Musculoskeletal:  Negative for arthralgias.  Skin:  Negative for itching and rash.  Neurological:  Negative for extremity weakness.       Falls  Hematological:  Negative for adenopathy. Bruises/bleeds easily.  Psychiatric/Behavioral:  Negative for confusion.     MEDICAL HISTORY:  Past Medical History:  Diagnosis Date   Arthralgia of multiple joints    limited mobility w/  independant adl's   BPH with obstruction/lower urinary tract symptoms    Chronic idiopathic monocytosis    followed by dr Waymon Budge--  per lov note 06/ 2017 persistant unexplained  with normal bone barrow bx   Coronary atherosclerosis of unspecified type of vessel, native or graft    cardiologist-  dr Stanford Breed -- per lov note 11-19-2014 ,  cardiac cath in 2000-- pLAD 60-70%,  small intermediate branch 70-80%,  mRCA 20%,  normal LV   Degenerative arthritis of spine    cervical and lumar   Diverticulosis of colon (without mention of hemorrhage)    Dysuria    chronic   Elevated PSA    Feeling of incomplete bladder emptying    Hiatal hernia    moderate per ct 11/ 2017   History of adenomatous polyp of colon    2008   History of atrial fibrillation    remote hx  episode   History of COVID-19 03/05/2020   History of pancreatitis    07-01-2013  and 10-10-2015   History of prostatitis    2014   Hx of colonic polyps    Hyperlipidemia    Hypertension    Mouth sore    roof of mouth sore    Nephrolithiasis    Nocturia more than twice per night    severe  w/ leakage   OSA (obstructive sleep apnea)    INTOLERANT  CPAP   Osteoarthritis    Pernicious anemia    B12  Def.   Peyronie disease    Renal insufficiency    Sepsis ([place]) 03/22/2017   Thrombocytopenia, unspecified ([place]) hemotology/oncologist-  dr Waymon Budge   per dr Velta Addison note 06/ 2016  secondary to vitro clumping   Vitamin B deficiency     SURGICAL HISTORY: Past Surgical History:  Procedure Laterality Date   BONE MARROW BIOPSY  2011   normal   CARDIAC CATHETERIZATION  2000   per dr Kathyrn Drown note --  dLAD 60-70%,  small intermediate branch 70-80%,  mRCA 20%,  normal LV   CARDIOVASCULAR STRESS TEST  01-29-2013   dr Stanford Breed   normal nuclear study w/ no ischemia,  normal LV function and wall motion , ef 82%   CATARACT EXTRACTION W/ INTRAOCULAR LENS  IMPLANT, BILATERAL  08/2015   CHOLECYSTECTOMY  11/24/2010   Procedure: LAPAROSCOPIC CHOLECYSTECTOMY WITH INTRAOPERATIVE CHOLANGIOGRAM;  Surgeon: Earnstine Regal, MD;  Location: [facility];  Service: General;  Laterality: N/A;  c-arm   CYSTOSCOPY W/ URETERAL STENT PLACEMENT Left 12/17/2015   Procedure: CYSTOSCOPY WITH RETROGRADE PYELOGRAM/URETERAL STENT PLACEMENT;  Surgeon: Ardis Hughs, MD;  Location: [facility];  Service: Urology;  Laterality: Left;   CYSTOSCOPY WITH RETROGRADE PYELOGRAM, URETEROSCOPY AND STENT PLACEMENT Left 12/22/2015   Procedure: CYSTOSCOPY WITH LEFT RETROGRADE  URETEROSCOPY AND STENT PLACEMENT;  Surgeon: Carolan Clines, MD;  Location: [facility];  Service: Urology;  Laterality: Left;   ERCP N/A 07/04/2013   Procedure: ENDOSCOPIC RETROGRADE CHOLANGIOPANCREATOGRAPHY (ERCP);  Surgeon: Gatha Mayer, MD;  Location: [facility];  Service: Endoscopy;  Laterality: N/A;  MAC if available   ERCP N/A 10/12/2015   Procedure: ENDOSCOPIC RETROGRADE CHOLANGIOPANCREATOGRAPHY (ERCP);  Surgeon: [name], MD;  Location: [facility];  Service: Endoscopy;  Laterality: N/A;   HOLMIUM LASER APPLICATION Left 01/24/5535   Procedure: HOLMIUM LASER APPLICATION;   Surgeon: Carolan Clines, MD;  Location: [facility];  Service: Urology;  Laterality: Left;   INGUINAL HERNIA REPAIR Right 01/25/2002   KNEE ARTHROSCOPY  x5   SATURATION BIOPSY OF PROSTATE  05-29-2007  and 01-26-2008   SHOULDER ARTHROSCOPY WITH OPEN ROTATOR CUFF REPAIR AND DISTAL CLAVICLE ACROMINECTOMY Right 11/19/2004   TOTAL HIP ARTHROPLASTY Right 05/21/2009   TOTAL KNEE ARTHROPLASTY Bilateral left 05-02-2003/  right  03-23-2010   TRANSTHORACIC ECHOCARDIOGRAM  12/23/2004   ef 60%, mild MV calcification without stenosis/  mild TR,  PASP 66mHg   UVULOPALATOPHARYNGOPLASTY  1993    w/  T & A    SOCIAL HISTORY: Social History   Socioeconomic History   Marital status: Widowed    Spouse name: Not on file   Number of children: 2   Years of education: Not on file   Highest education level: Not on file  Occupational History   Occupation: retired    [employer] farm manager RETIRED  Tobacco Use   Smoking status: Former    Types: Pipe    Quit date:  01/18/1966    Years since quitting: 55.5   Smokeless tobacco: Never  Vaping Use   Vaping Use: Never used  Substance and Sexual Activity   Alcohol use: No    Alcohol/week: 0.0 standard drinks of alcohol   Drug use: No   Sexual activity: Not Currently  Other Topics Concern   Not on file  Social History Narrative   DNR   Widowed   2 daughters leave near by   Social Determinants of Health   Financial Resource Strain: Not on file  Food Insecurity: Not on file  Transportation Needs: Not on file  Physical Activity: Not on file  Stress: Not on file  Social Connections: Not on file  Intimate Partner Violence: Not on file    FAMILY HISTORY: Family History  Problem Relation Age of Onset   Peripheral vascular disease Mother    Colon cancer Father 28   Breast cancer Daughter    Breast cancer Daughter     ALLERGIES:  is allergic to penicillins and tape.  MEDICATIONS:  Current Outpatient Medications  Medication Sig Dispense  Refill   acetaminophen (TYLENOL) 325 MG tablet Take 650 mg by mouth every 6 (six) hours as needed for mild pain or headache.     Artificial Tear Solution (TEARS NATURALE OP) Place 1-2 drops into both eyes at bedtime as needed (dry eyes).     doxycycline (VIBRA-TABS) 100 MG tablet Take 1 tablet (100 mg total) by mouth 2 (two) times daily. 20 tablet 0   Ensure (ENSURE) Take 237 mLs by mouth daily. Chocolate     sertraline (ZOLOFT) 25 MG tablet Take 50 mg by mouth daily.     tamsulosin (FLOMAX) 0.4 MG CAPS capsule Take 2 capsules (0.8 mg total) by mouth daily. For urine flow 180 capsule 0   traZODone (DESYREL) 50 MG tablet Take 0.5-1 tablets (25-50 mg total) by mouth at bedtime. For sleep. 90 tablet 0   No current facility-administered medications for this visit.     PHYSICAL EXAMINATION: ECOG PERFORMANCE STATUS: 2 - Symptomatic, <50% confined to bed Vitals:   07/17/21 1249  BP: (!) 133/44  Pulse: (!) 50  Resp: 18  Temp: 98.1 F (36.7 C)   Filed Weights   07/17/21 1249  Weight: 129 lb 6.4 oz (58.7 kg)    Physical Exam Constitutional:      General: He is not in acute distress. HENT:     Head: Normocephalic and atraumatic.  Eyes:     General: No scleral icterus. Cardiovascular:     Rate and Rhythm: Normal rate and regular rhythm.     Heart sounds: Normal heart sounds.  Pulmonary:     Effort: Pulmonary effort is normal. No respiratory distress.     Breath sounds: No wheezing.  Abdominal:     General: Bowel sounds are normal. There is no distension.     Palpations: Abdomen is soft.  Musculoskeletal:        General: No deformity. Normal range of motion.     Cervical back: Normal range of motion and neck supple.  Skin:    General: Skin is warm and dry.     Findings: Bruising present. No erythema or rash.  Neurological:     Mental Status: He is alert. Mental status is at baseline.  Psychiatric:        Mood and Affect: Mood normal.     LABORATORY DATA:  I have reviewed  the data as listed Lab Results  Component Value Date  WBC 13.2 (H) 07/17/2021   HGB 15.3 07/17/2021   HCT 48.0 07/17/2021   MCV 85.6 07/17/2021   PLT 38 (L) 07/17/2021   Recent Labs    06/14/21 0051 06/17/21 1010 06/25/21 1057 07/03/21 0932 07/15/21 1128 07/17/21 1325  NA 142  --   --  143  --  142  K 4.1  --   --  4.1  --  4.6  CL 116*  --   --  108  --  105  CO2 21*  --   --  25  --  29  GLUCOSE 100*  --   --  117*  --  111*  BUN 13  --   --  19  --  13  CREATININE 0.87  --   --  1.01  --  1.11  CALCIUM 7.9*  --   --  9.1  --  8.8*  GFRNONAA >60  --   --  >60  --  >60  PROT 4.2* 5.2* 5.6* 6.2* 5.8* 5.7*  ALBUMIN 2.5* 3.6 3.8 4.2 4.2 3.9  AST 34 43* _0 ALT 82* 68* _1 ALKPHOS 243* 227* 150* 105 92 76  BILITOT 1.7* 1.7* 1.4* 1.7* 1.2 1.1  BILIDIR  --  0.7* 0.4*  --  0.3  --    Iron/TIBC/Ferritin/ %Sat    Component Value Date/Time   IRON 70 11/30/2018 1603   TIBC 414 11/30/2018 1603   FERRITIN 16 (L) 11/30/2018 1603   IRONPCTSAT 17 (L) 11/30/2018 1603      RADIOGRAPHIC STUDIES: I have personally reviewed the radiological images as listed and agreed with the findings in the report. CT Head Wo Contrast  Result Date: 07/03/2021 CLINICAL DATA:  Polytrauma, blunt EXAM: CT HEAD WITHOUT CONTRAST CT CERVICAL SPINE WITHOUT CONTRAST TECHNIQUE: Multidetector CT imaging of the head and cervical spine was performed following the standard protocol without intravenous contrast. Multiplanar CT image reconstructions of the cervical spine were also generated. RADIATION DOSE REDUCTION: This exam was performed according to the departmental dose-optimization program which includes automated exposure control, adjustment of the mA and/or kV according to patient size and/or use of iterative reconstruction technique. COMPARISON:  02/14/2021 FINDINGS: CT HEAD FINDINGS Brain: No evidence of acute infarction, hemorrhage, hydrocephalus, extra-axial collection or mass lesion/mass  effect. Patchy low-density changes within the periventricular and subcortical white matter compatible with chronic microvascular ischemic change. Mild-moderate diffuse cerebral volume loss. Vascular: Atherosclerotic calcifications involving the large vessels of the skull base. No unexpected hyperdense vessel. Skull: Normal. Negative for fracture or focal lesion. Sinuses/Orbits: No acute finding. Other: Negative for scalp hematoma. CT CERVICAL SPINE FINDINGS Alignment: Facet joints are aligned without dislocation or traumatic listhesis. Dens and lateral masses are aligned. Degenerative grade 1 anterolisthesis of C2 on C3 and C7 on T1, unchanged. Skull base and vertebrae: No acute fracture. No primary bone lesion or focal pathologic process. Soft tissues and spinal canal: No prevertebral fluid or swelling. No visible canal hematoma. Disc levels: Advanced multilevel degenerative disc disease and facet arthropathy throughout the cervical spine, not appreciably changed from prior. Upper chest: Included lung apices are clear. Other: Aortic atherosclerosis. IMPRESSION: 1. No acute intracranial abnormality. 2. No acute cervical spine fracture or traumatic listhesis. Electronically Signed   By: Davina Poke D.O.   On: 07/03/2021 10:18   CT Cervical Spine Wo Contrast  Result Date: 07/03/2021 CLINICAL DATA:  Polytrauma, blunt EXAM: CT HEAD WITHOUT CONTRAST CT CERVICAL SPINE WITHOUT CONTRAST  TECHNIQUE: Multidetector CT imaging of the head and cervical spine was performed following the standard protocol without intravenous contrast. Multiplanar CT image reconstructions of the cervical spine were also generated. RADIATION DOSE REDUCTION: This exam was performed according to the departmental dose-optimization program which includes automated exposure control, adjustment of the mA and/or kV according to patient size and/or use of iterative reconstruction technique. COMPARISON:  02/14/2021 FINDINGS: CT HEAD FINDINGS Brain:  No evidence of acute infarction, hemorrhage, hydrocephalus, extra-axial collection or mass lesion/mass effect. Patchy low-density changes within the periventricular and subcortical white matter compatible with chronic microvascular ischemic change. Mild-moderate diffuse cerebral volume loss. Vascular: Atherosclerotic calcifications involving the large vessels of the skull base. No unexpected hyperdense vessel. Skull: Normal. Negative for fracture or focal lesion. Sinuses/Orbits: No acute finding. Other: Negative for scalp hematoma. CT CERVICAL SPINE FINDINGS Alignment: Facet joints are aligned without dislocation or traumatic listhesis. Dens and lateral masses are aligned. Degenerative grade 1 anterolisthesis of C2 on C3 and C7 on T1, unchanged. Skull base and vertebrae: No acute fracture. No primary bone lesion or focal pathologic process. Soft tissues and spinal canal: No prevertebral fluid or swelling. No visible canal hematoma. Disc levels: Advanced multilevel degenerative disc disease and facet arthropathy throughout the cervical spine, not appreciably changed from prior. Upper chest: Included lung apices are clear. Other: Aortic atherosclerosis. IMPRESSION: 1. No acute intracranial abnormality. 2. No acute cervical spine fracture or traumatic listhesis. Electronically Signed   By: Davina Poke D.O.   On: 07/03/2021 10:18   DG Chest 2 View  Result Date: 07/03/2021 CLINICAL DATA:  fall EXAM: CHEST - 2 VIEW COMPARISON:  February 05, 2020 FINDINGS: The heart size and mediastinal contours are within normal limits. Thoracic aorta is ectatic. Atheromatous calcifications at the arch of the aorta. Both lungs are clear without consolidation, pleural effusion or pneumothorax. Moderately severe degenerative changes of the shoulder joints greater on the left, stable. IMPRESSION: Lungs are stable with no active cardiopulmonary disease. Electronically Signed   By: Frazier Richards M.D.   On: 07/03/2021 10:00       ASSESSMENT & PLAN:  1. Thrombocytopenia (HCC)   2. Leukocytosis, unspecified type    # Acute on chronic leukocytosis, acute on chronic thrombocytopenia Previous bone marrow in 2011 showed hypercellular marrow.   Check smear, cbc, cmp, myeloma panel, light chain ratio, blood flowcytometry, immature platelet fraction, folate, Vitamin B12, HIV, Jak2 mutation reflex to other mutations, TSH, BCR-ABL1 FISH, LDH.   Orders Placed This Encounter  Procedures   Comprehensive metabolic panel    Standing Status:   Future    Number of Occurrences:   1    Standing Expiration Date:   07/18/2022   CBC with Differential/Platelet    Standing Status:   Future    Number of Occurrences:   1    Standing Expiration Date:   07/18/2022   Lactate dehydrogenase    Standing Status:   Future    Number of Occurrences:   1    Standing Expiration Date:   07/18/2022   Kappa/lambda light chains    Standing Status:   Future    Number of Occurrences:   1    Standing Expiration Date:   07/18/2022   Flow cytometry panel-leukemia/lymphoma work-up    Standing Status:   Future    Number of Occurrences:   1    Standing Expiration Date:   07/18/2022   Immature Platelet Fraction    Standing Status:   Future  Number of Occurrences:   1    Standing Expiration Date:   07/18/2022   Multiple Myeloma Panel (SPEP&IFE w/QIG)    Standing Status:   Future    Number of Occurrences:   1    Standing Expiration Date:   07/18/2022   HIV Antibody (routine testing w rflx)    Standing Status:   Future    Number of Occurrences:   1    Standing Expiration Date:   07/18/2022   Folate    Standing Status:   Future    Number of Occurrences:   1    Standing Expiration Date:   07/18/2022   Vitamin B12    Standing Status:   Future    Number of Occurrences:   1    Standing Expiration Date:   07/18/2022   TSH    Standing Status:   Future    Number of Occurrences:   1    Standing Expiration Date:   07/18/2022   JAK2 [id number] rfx  CALR/MPL/E12-15   BCR-ABL1 FISH    Standing Status:   Future    Number of Occurrences:   1    Standing Expiration Date:   07/18/2022   Pathologist smear review    Standing Status:   Future    Number of Occurrences:   1    Standing Expiration Date:   07/18/2022    All questions were answered. The patient knows to call the clinic with any problems questions or concerns.  cc Pleas Koch, NP    Return of visit: 2 weeks to review results.  Thank you for this kind referral and the opportunity to participate in the care of this patient. A copy of today's note is routed to referring provider   Earlie Server, MD, PhD [facility] Hematology Oncology 07/18/2021

## 2021-07-20 ENCOUNTER — Encounter: Payer: Self-pay | Admitting: Family Medicine

## 2021-07-20 ENCOUNTER — Ambulatory Visit (INDEPENDENT_AMBULATORY_CARE_PROVIDER_SITE_OTHER): Payer: Medicare HMO | Admitting: Family Medicine

## 2021-07-20 VITALS — BP 120/62 | HR 78 | Temp 98.4°F | Ht 60.0 in | Wt 131.0 lb

## 2021-07-20 DIAGNOSIS — D696 Thrombocytopenia, unspecified: Secondary | ICD-10-CM | POA: Diagnosis not present

## 2021-07-20 DIAGNOSIS — A6920 Lyme disease, unspecified: Secondary | ICD-10-CM | POA: Diagnosis not present

## 2021-07-20 LAB — COMP PANEL: LEUKEMIA/LYMPHOMA

## 2021-07-20 LAB — KAPPA/LAMBDA LIGHT CHAINS
Kappa free light chain: 11.6 mg/L (ref 3.3–19.4)
Kappa, lambda light chain ratio: 1.29 (ref 0.26–1.65)
Lambda free light chains: 9 mg/L (ref 5.7–26.3)

## 2021-07-20 NOTE — Assessment & Plan Note (Signed)
Acute rash appears to be progressing.  On close exam it appears to be more vascular in nature likely related to his thrombocytopenia and aging skin following the tick bite and associated inflammation. There is no sign of local cellulitis.  He has been taking doxycycline twice daily for 5 days and I encouraged him to complete this to cover potential tickborne illness. He has no suggestion of progression of any tickborne illness.  He has no fever no joint symptoms no neurologic change.  No change in antibiotic is indicated.

## 2021-07-20 NOTE — Telephone Encounter (Signed)
Have not seen anything recent. Called to get to them to fax. Call would not go through at this time.

## 2021-07-20 NOTE — Patient Instructions (Addendum)
Complete antibiotics entirely!  Can take Claritin for itching during the day, if severe can take Benadryl at night.  Can use topical steroid cream  2 times daily as needed for itching.  If fever, new neck pain, confusion.Marland Kitchen  go to ER.

## 2021-07-20 NOTE — Progress Notes (Signed)
Patient ID: [name], male    DOB: 15-Jan-1934, 88 y.o.   MRN: [id number]  This visit was conducted in person.  BP 120/62   Pulse 78   Temp 98.4 F (36.9 C) (Oral)   Ht 5' (1.524 m)   Wt 131 lb (59.4 kg)   SpO2 97%   BMI 25.58 kg/m    CC:  Chief Complaint  Patient presents with   Follow-up    Tick Bite on Left Upper Leg    Subjective:   HPI: [name] is a 88 y.o. male  pt of Allie Bossier , NP with history of  thrombocytopenia, HTN presenting on 07/20/2021 for Follow-up (Tick Bite on Left Upper Leg)  Reviewed last OV not from PCP on 07/15/21 Recent hospitalization on 07/03/2021 following a fall. He found an engorged tick to his left medial upper thigh, thinks the tick  6/25, felt it attached to him while laying in the yard. The ED provider did not see the tick. Since then he's developed a red and blue ring rash to the site with tenderness and warmth.   Given concern for erythema migrains... treated with doxycycline 100 mg BID.  He presents today with worsening of rash on right leg. It started with small spot and was itchy.  Change to target like rash.  They report  the rash is increased in size ... Bruises  very easily. He is on day 5/10 of doxy... no SE. Otherwise he feels well. No new neck pain, no  fever. No new joint pain.  He has had decreased energy lately.  Followed by onc for thrombocytosis.Marland Kitchen plt 38,  wbc improved at 13.2 down from 21.  Relevant past medical, surgical, family and social history reviewed and updated as indicated. Interim medical history since our last visit reviewed. Allergies and medications reviewed and updated. Outpatient Medications Prior to Visit  Medication Sig Dispense Refill   acetaminophen (TYLENOL) 325 MG tablet Take 650 mg by mouth every 6 (six) hours as needed for mild pain or headache.     Artificial Tear Solution (TEARS NATURALE OP) Place 1-2 drops into both eyes at bedtime as needed (dry eyes).     doxycycline (VIBRA-TABS)  100 MG tablet Take 1 tablet (100 mg total) by mouth 2 (two) times daily. 20 tablet 0   Ensure (ENSURE) Take 237 mLs by mouth daily. Chocolate     sertraline (ZOLOFT) 25 MG tablet Take 50 mg by mouth daily.     tamsulosin (FLOMAX) 0.4 MG CAPS capsule Take 2 capsules (0.8 mg total) by mouth daily. For urine flow 180 capsule 0   traZODone (DESYREL) 50 MG tablet Take 0.5-1 tablets (25-50 mg total) by mouth at bedtime. For sleep. 90 tablet 0   No facility-administered medications prior to visit.     Per HPI unless specifically indicated in ROS section below Review of Systems  Constitutional:  Negative for fatigue and fever.  HENT:  Negative for ear pain.   Eyes:  Negative for pain.  Respiratory:  Negative for cough and shortness of breath.   Cardiovascular:  Negative for chest pain, palpitations and leg swelling.  Gastrointestinal:  Negative for abdominal pain.  Genitourinary:  Negative for dysuria.  Musculoskeletal:  Negative for arthralgias.  Neurological:  Negative for syncope, light-headedness and headaches.  Psychiatric/Behavioral:  Negative for dysphoric mood.    Objective:  BP 120/62   Pulse 78   Temp 98.4 F (36.9 C) (Oral)   Ht 5' (1.524  m)   Wt 131 lb (59.4 kg)   SpO2 97%   BMI 25.58 kg/m   Wt Readings from Last 3 Encounters:  07/20/21 131 lb (59.4 kg)  07/17/21 129 lb 6.4 oz (58.7 kg)  07/16/21 129 lb (58.5 kg)      Physical Exam Constitutional:      Appearance: He is well-developed and normal weight. He is not ill-appearing or diaphoretic.  HENT:     Head: Normocephalic.     Right Ear: Hearing normal.     Left Ear: Hearing normal.     Nose: Nose normal.  Neck:     Thyroid: No thyroid mass or thyromegaly.     Vascular: No carotid bruit.     Trachea: Trachea normal.  Cardiovascular:     Rate and Rhythm: Normal rate and regular rhythm.     Pulses: Normal pulses.     Heart sounds: Heart sounds not distant. No murmur heard.    No friction rub. No gallop.      Comments: No peripheral edema Pulmonary:     Effort: Pulmonary effort is normal. No respiratory distress.     Breath sounds: Normal breath sounds.  Musculoskeletal:     Cervical back: Full passive range of motion without pain and normal range of motion.  Skin:    General: Skin is warm and dry.     Findings: Rash present.  Neurological:     Mental Status: He is alert.  Psychiatric:        Speech: Speech normal.        Behavior: Behavior normal.        Thought Content: Thought content normal.        Results for orders placed or performed in visit on 07/17/21  TSH  Result Value Ref Range   TSH 0.917 0.350 - 4.500 uIU/mL  Vitamin B12  Result Value Ref Range   Vitamin B-12 1,691 (H) 180 - 914 pg/mL  Folate  Result Value Ref Range   Folate 19.1 >5.9 ng/mL  HIV Antibody (routine testing w rflx)  Result Value Ref Range   HIV Screen 4th Generation wRfx Non Reactive Non Reactive  Immature Platelet Fraction  Result Value Ref Range   Immature Platelet Fraction 11.3 (H) 1.2 - 8.6 %  Flow cytometry panel-leukemia/lymphoma work-up  Result Value Ref Range   PATH INTERP XXX-IMP Comment    ANNOTATION COMMENT IMP Comment    CLINICAL INFO Comment    Specimen Type Comment    ASSESSMENT OF LEUKOCYTES Comment    % Viable Cells Comment    ANALYSIS AND GATING STRATEGY Comment    IMMUNOPHENOTYPING STUDY Comment    PATHOLOGIST NAME Comment    COMMENT: Comment   Kappa/lambda light chains  Result Value Ref Range   Kappa free light chain 11.6 3.3 - 19.4 mg/L   Lambda free light chains 9.0 5.7 - 26.3 mg/L   Kappa, lambda light chain ratio 1.29 0.26 - 1.65  Lactate dehydrogenase  Result Value Ref Range   LDH 135 98 - 192 U/L  CBC with Differential/Platelet  Result Value Ref Range   WBC 13.2 (H) 4.0 - 10.5 K/uL   RBC 5.61 4.22 - 5.81 MIL/uL   Hemoglobin 15.3 13.0 - 17.0 g/dL   HCT 48.0 39.0 - 52.0 %   MCV 85.6 80.0 - 100.0 fL   MCH 27.3 26.0 - 34.0 pg   MCHC 31.9 30.0 - 36.0 g/dL   RDW  16.9 (H) 11.5 - 15.5 %  Platelets 38 (L) 150 - 400 K/uL   nRBC 0.0 0.0 - 0.2 %   Neutrophils Relative % 57 %   Neutro Abs 7.6 1.7 - 7.7 K/uL   Lymphocytes Relative 10 %   Lymphs Abs 1.3 0.7 - 4.0 K/uL   Monocytes Relative 25 %   Monocytes Absolute 3.3 (H) 0.1 - 1.0 K/uL   Eosinophils Relative 1 %   Eosinophils Absolute 0.1 0.0 - 0.5 K/uL   Basophils Relative 1 %   Basophils Absolute 0.1 0.0 - 0.1 K/uL   WBC Morphology MILD LEFT SHIFT (1-5% METAS, OCC MYELO, OCC BANDS)    RBC Morphology MORPHOLOGY UNREMARKABLE    Smear Review Normal platelet morphology    Immature Granulocytes 6 %   Abs Immature Granulocytes 0.76 (H) 0.00 - 0.07 K/uL  Comprehensive metabolic panel  Result Value Ref Range   Sodium 142 135 - 145 mmol/L   Potassium 4.6 3.5 - 5.1 mmol/L   Chloride 105 98 - 111 mmol/L   CO2 29 22 - 32 mmol/L   Glucose, Bld 111 (H) 70 - 99 mg/dL   BUN 13 8 - 23 mg/dL   Creatinine, Ser 1.11 0.61 - 1.24 mg/dL   Calcium 8.8 (L) 8.9 - 10.3 mg/dL   Total Protein 5.7 (L) 6.5 - 8.1 g/dL   Albumin 3.9 3.5 - 5.0 g/dL   AST 17 15 - 41 U/L   ALT 13 0 - 44 U/L   Alkaline Phosphatase 76 38 - 126 U/L   Total Bilirubin 1.1 0.3 - 1.2 mg/dL   GFR, Estimated >60 >60 mL/min   Anion gap 8 5 - 15  Pathologist smear review  Result Value Ref Range   Path Review Blood smear is reviewed.      COVID 19 screen:  No recent travel or known exposure to COVID19 The patient denies respiratory symptoms of COVID 19 at this time. The importance of social distancing was discussed today.   Assessment and Plan    Problem List Items Addressed This Visit     Thrombocytopenia (HCC) (Chronic)   Erythema migrans (Lyme disease) - Primary    Acute rash appears to be progressing.  On close exam it appears to be more vascular in nature likely related to his thrombocytopenia and aging skin following the tick bite and associated inflammation. There is no sign of local cellulitis.  He has been taking doxycycline twice  daily for 5 days and I encouraged him to complete this to cover potential tickborne illness. He has no suggestion of progression of any tickborne illness.  He has no fever no joint symptoms no neurologic change.  No change in antibiotic is indicated.          Eliezer Lofts, MD

## 2021-07-22 ENCOUNTER — Telehealth: Payer: Self-pay

## 2021-07-22 ENCOUNTER — Telehealth: Payer: Self-pay | Admitting: Oncology

## 2021-07-22 DIAGNOSIS — F331 Major depressive disorder, recurrent, moderate: Secondary | ICD-10-CM

## 2021-07-22 DIAGNOSIS — F419 Anxiety disorder, unspecified: Secondary | ICD-10-CM

## 2021-07-22 LAB — BCR-ABL1 FISH
Cells Analyzed: 200
Cells Counted: 200

## 2021-07-22 MED ORDER — SERTRALINE HCL 50 MG PO TABS: 50.0000 mg | ORAL_TABLET | Freq: Every day | ORAL | 1 refills | Status: DC

## 2021-07-22 NOTE — Telephone Encounter (Signed)
MEDICATION: sertraline (ZOLOFT) 25 MG tablet  PHARMACY: [id number] [place], [place] - [address]  Comments: Daughter states that KAnda Kraftdiscussed an increase to 50 mg and is asking for a new prescription to be written, and has not picked up the refill that was called in the other day. [place]   **Let patient know to contact pharmacy at the end of the day to make sure medication is ready. **  ** Please notify patient to allow 48-72 hours to process**  **Encourage patient to contact the pharmacy for refills or [facility]

## 2021-07-22 NOTE — Telephone Encounter (Signed)
Home Health verbal orders  Agency Name: [facility]   Requesting Skilled Nursing  Reason: EVAL. For low blood pressure, educate on healthy eating habits and monitor depression   Frequency:  Please forward to [facility] pool or providers CMA

## 2021-07-22 NOTE — Telephone Encounter (Signed)
Pt is seeing a provider in [place] since it is closer to their home. Cancelled appt with Dr. Tasia Catchings.

## 2021-07-22 NOTE — Telephone Encounter (Signed)
Noted, new Rx for sertraline 50 mg tablets sent to pharmacy.

## 2021-07-22 NOTE — Telephone Encounter (Signed)
Approved.  

## 2021-07-23 ENCOUNTER — Telehealth: Payer: Self-pay | Admitting: Hematology and Oncology

## 2021-07-23 NOTE — Telephone Encounter (Signed)
Called left detailed message on secured voice mail.

## 2021-07-23 NOTE — Telephone Encounter (Signed)
Per 7/5 staff msg with RN Merleen Nicely - Dr. Lindi Adie would like pt to see Dr. Lorenso Courier due to his specific diagnosis. She requested that I cancel appt with Dr. Lindi Adie. Appt cancelled and high priority msg sent to Dr. Lorenso Courier for scheduling instructions as his next available is several weeks out. I called pt's daughter, [name] and let her know appt was cancelled and I would reach back out with a new appt with Dr. Lorenso Courier. She verbalized understanding.

## 2021-07-24 ENCOUNTER — Telehealth: Payer: Self-pay | Admitting: *Deleted

## 2021-07-24 ENCOUNTER — Telehealth: Payer: Self-pay | Admitting: Hematology and Oncology

## 2021-07-24 LAB — MULTIPLE MYELOMA PANEL, SERUM
Albumin SerPl Elph-Mcnc: 3.8 g/dL (ref 2.9–4.4)
Albumin/Glob SerPl: 2.1 — ABNORMAL HIGH (ref 0.7–1.7)
Alpha 1: 0.2 g/dL (ref 0.0–0.4)
Alpha2 Glob SerPl Elph-Mcnc: 0.6 g/dL (ref 0.4–1.0)
B-Globulin SerPl Elph-Mcnc: 0.7 g/dL (ref 0.7–1.3)
Gamma Glob SerPl Elph-Mcnc: 0.4 g/dL (ref 0.4–1.8)
Globulin, Total: 1.9 g/dL — ABNORMAL LOW (ref 2.2–3.9)
IgA: 19 mg/dL — ABNORMAL LOW (ref 61–437)
IgG (Immunoglobin G), Serum: 411 mg/dL — ABNORMAL LOW (ref 603–1613)
IgM (Immunoglobulin M), Srm: 29 mg/dL (ref 15–143)
Total Protein ELP: 5.7 g/dL — ABNORMAL LOW (ref 6.0–8.5)

## 2021-07-24 NOTE — Telephone Encounter (Signed)
Received call from pt's daughter, Lemmie Evens. She is calling to see if it ok if both she and her sister can be with pt when he sees Dr. Lorenso Courier on 08/04/21. Advised that it will be fine during the office visit only. She voiced her appreciation of this. Added appt note indicating that it is ok for 2 visitors with pt on 08/04/21  She also wanted [place] to know that pt had been seen by Dr. Beryle Beams back in 2015 and perhaps before that for similar reason pt is coming to see Dr. Lorenso Courier. He had a bone marrow biopsy in 2011

## 2021-07-24 NOTE — Telephone Encounter (Signed)
Scheduled appt with Dr. Lorenso Courier per 7/5 staff msg with Dr. Lorenso Courier and RN Merleen Nicely. Spoke to pt's daughter, [name], who is aware of new appt date/time.

## 2021-07-27 ENCOUNTER — Ambulatory Visit: Payer: Medicare HMO | Admitting: Oncology

## 2021-07-27 ENCOUNTER — Encounter: Payer: Medicare HMO | Admitting: Hematology and Oncology

## 2021-07-28 ENCOUNTER — Telehealth: Payer: Self-pay | Admitting: Primary Care

## 2021-07-28 LAB — JAK2 V617F RFX CALR/MPL/E12-15

## 2021-07-28 LAB — CALR +MPL + E12-E15  (REFLEX)

## 2021-07-28 NOTE — Telephone Encounter (Signed)
[place] Name: [facility] Agency Name: [facility] Phone #: [phone] (Secure) Service Requested: OT (examples: OT/PT/Skilled Nursing/Social Work/Speech Therapy/Wound Care) Frequency of Visits: 2 times a week for 3 weeks with reassessment

## 2021-07-28 NOTE — Telephone Encounter (Signed)
Approved.  

## 2021-07-29 ENCOUNTER — Ambulatory Visit: Payer: Medicare HMO | Admitting: Family Medicine

## 2021-07-29 ENCOUNTER — Ambulatory Visit: Payer: Medicare HMO | Admitting: Primary Care

## 2021-07-29 NOTE — Telephone Encounter (Signed)
Called left detailed message on secured voice mail for [name]. With verbal ok.

## 2021-07-31 DIAGNOSIS — R351 Nocturia: Secondary | ICD-10-CM | POA: Diagnosis not present

## 2021-07-31 DIAGNOSIS — R3915 Urgency of urination: Secondary | ICD-10-CM | POA: Diagnosis not present

## 2021-07-31 DIAGNOSIS — N401 Enlarged prostate with lower urinary tract symptoms: Secondary | ICD-10-CM | POA: Diagnosis not present

## 2021-07-31 DIAGNOSIS — R350 Frequency of micturition: Secondary | ICD-10-CM | POA: Diagnosis not present

## 2021-08-03 ENCOUNTER — Telehealth: Payer: Self-pay | Admitting: Primary Care

## 2021-08-03 DIAGNOSIS — F419 Anxiety disorder, unspecified: Secondary | ICD-10-CM

## 2021-08-03 DIAGNOSIS — F32A Depression, unspecified: Secondary | ICD-10-CM

## 2021-08-03 NOTE — Telephone Encounter (Signed)
[name] from [facility] called in wants to know status on paperwork that was fax over that needs to be sign . # [phone] [phone]

## 2021-08-03 NOTE — Telephone Encounter (Signed)
Patient daughter [name] called stating last time he came in for a visit you all discussed a referral to [facility]. She went to our facility and they are booked out for 6 months. She was wanting a referral to be sent over Dr. Casimiro Needle at [facility] [facility] [place] in [place]. Please advise. Thank you!   Fax: [phone]

## 2021-08-03 NOTE — Progress Notes (Unsigned)
[name] Telephone:([phone]) [phone]   Fax:([phone]) [place] NOTE  Patient Care Team: Pleas Koch, NP as PCP - General (Internal Medicine) Annia Belt, MD as Consulting Physician (Oncology) Carolan Clines, MD (Inactive) as Consulting Physician (Urology) Lafayette Dragon, MD (Inactive) as Consulting Physician (Gastroenterology) Lelon Perla, MD as Consulting Physician (Cardiology) Lloyd Huger, MD as Consulting Physician (Oncology)  Hematological/Oncological History # Monocytosis/Thrombocytopenia 10/30/2009: bone marrow biopsy showed hypercellular marrow with trilineage monoparesis. 07/17/2021: Last visit with Dr. Tasia Catchings at [facility].  White blood cell 13.2, hemoglobin 15.3, MCV 85.6, platelets of 38.  Absolute monocyte count 3300 08/04/2021: Transfer care to Dr. Lorenso [name] due to physical proximity  CHIEF COMPLAINTS/PURPOSE OF CONSULTATION:  "Longstanding monocytosis and thrombocytopenia"  HISTORY OF PRESENTING ILLNESS:  [name] 88 y.o. male with medical history significant for longstanding monocytosis and thrombocytosis previously followed by Dr. Beryle Beams and Dr. Tasia Catchings now transitioning care to Dr. Lorenso [name] due to geographical proximity.  On review of the previous records [name] is struggled with this for as long as we have records.  His monocytosis and thrombocytopenia are highly fluctuant though platelets rarely drop below less than 30.  Hemoglobin has been steady throughout the course.  White blood cell count rises and falls and more recently has been within normal range.  Absolute monocyte count remains elevated.  He did undergo bone marrow biopsy in 2011 which did not show any clear abnormalities but subsequently has not had any bone marrow biopsies.  On exam today [name] is accompanied by his daughter.  His conversation is tangential and his daughter does a lot of the talking for him.  He reports that in the 1970s he  was a heavy donor to the [employer] and donated about 7 gallons of blood.  He reports that he feels overall weak like he "cannot do anything".  He notes that when he turned 87 he began falling apart.  He notes his energy is about a 5 out of 10 right now.  His appetite is quite poor and eats small amounts.  He also had COVID 2 years ago and his taste has not been right since that time.  He has not been having any overt signs of bleeding, bruising, or dark stools we does have some prescription on his leg which he associates with mowing the lawn.  He notes that he is disappointed that he cannot drive and due to his shoulder injuries.  He notes that his stools are dark "always".  He notes that he thinks they are more dark depending on what he eats that day.  He reports he does have some occasional episodes of dizziness and weakness.  On further discussion he notes that he is a never smoker never drinker and his wife unfortunately passed away from pancreatic cancer.  He notes that if he was found to have a blood disorder or blood cancer he would not want to undergo any excessive treatment.  He would be agreeable to supportive care with consideration of blood transfusion if his counts were to drop too low.  He otherwise denies any fevers, chills, sweats, nausea, or diarrhea.  A full 10 point ROS is listed below.   MEDICAL HISTORY:  Past Medical History:  Diagnosis Date   Arthralgia of multiple joints    limited mobility w/  independant adl's   BPH with obstruction/lower urinary tract symptoms    Chronic idiopathic monocytosis    followed by dr Waymon Budge--  per lov note 06/ 2017 persistant unexplained  with normal bone barrow bx   Coronary atherosclerosis of unspecified type of vessel, native or graft    cardiologist-  dr Stanford Breed -- per lov note 11-19-2014 ,  cardiac cath in 2000-- pLAD 60-70%,  small intermediate branch 70-80%,  mRCA 20%,  normal LV   Degenerative arthritis of spine    cervical and lumar    Diverticulosis of colon (without mention of hemorrhage)    Dysuria    chronic   Elevated PSA    Feeling of incomplete bladder emptying    Hiatal hernia    moderate per ct 11/ 2017   History of adenomatous polyp of colon    2008   History of atrial fibrillation    remote hx episode   History of COVID-19 03/05/2020   History of pancreatitis    07-01-2013  and 10-10-2015   History of prostatitis    2014   Hx of colonic polyps    Hyperlipidemia    Hypertension    Mouth sore    roof of mouth sore    Nephrolithiasis    Nocturia more than twice per night    severe  w/ leakage   OSA (obstructive sleep apnea)    INTOLERANT CPAP   Osteoarthritis    Pernicious anemia    B12  Def.   Peyronie disease    Renal insufficiency    Sepsis ([place]) 03/22/2017   Thrombocytopenia, unspecified ([place]) hemotology/oncologist-  dr Waymon Budge   per dr Velta Addison note 06/ 2016  secondary to vitro clumping   Vitamin B deficiency     SURGICAL HISTORY: Past Surgical History:  Procedure Laterality Date   BONE MARROW BIOPSY  2011   normal   CARDIAC CATHETERIZATION  2000   per dr Kathyrn Drown note --  dLAD 60-70%,  small intermediate branch 70-80%,  mRCA 20%,  normal LV   CARDIOVASCULAR STRESS TEST  01-29-2013   dr Stanford Breed   normal nuclear study w/ no ischemia,  normal LV function and wall motion , ef 82%   CATARACT EXTRACTION W/ INTRAOCULAR LENS  IMPLANT, BILATERAL  08/2015   CHOLECYSTECTOMY  11/24/2010   Procedure: LAPAROSCOPIC CHOLECYSTECTOMY WITH INTRAOPERATIVE CHOLANGIOGRAM;  Surgeon: Earnstine Regal, MD;  Location: [facility];  Service: General;  Laterality: N/A;  c-arm   CYSTOSCOPY W/ URETERAL STENT PLACEMENT Left 12/17/2015   Procedure: CYSTOSCOPY WITH RETROGRADE PYELOGRAM/URETERAL STENT PLACEMENT;  Surgeon: Ardis Hughs, MD;  Location: [facility];  Service: Urology;  Laterality: Left;   CYSTOSCOPY WITH RETROGRADE PYELOGRAM, URETEROSCOPY AND STENT PLACEMENT Left 12/22/2015   Procedure:  CYSTOSCOPY WITH LEFT RETROGRADE  URETEROSCOPY AND STENT PLACEMENT;  Surgeon: Carolan Clines, MD;  Location: [facility];  Service: Urology;  Laterality: Left;   ERCP N/A 07/04/2013   Procedure: ENDOSCOPIC RETROGRADE CHOLANGIOPANCREATOGRAPHY (ERCP);  Surgeon: Gatha Mayer, MD;  Location: [facility];  Service: Endoscopy;  Laterality: N/A;  MAC if available   ERCP N/A 10/12/2015   Procedure: ENDOSCOPIC RETROGRADE CHOLANGIOPANCREATOGRAPHY (ERCP);  Surgeon: [name], MD;  Location: [facility];  Service: Endoscopy;  Laterality: N/A;   HOLMIUM LASER APPLICATION Left 17/0/0174   Procedure: HOLMIUM LASER APPLICATION;  Surgeon: Carolan Clines, MD;  Location: [facility];  Service: Urology;  Laterality: Left;   INGUINAL HERNIA REPAIR Right 01/25/2002   KNEE ARTHROSCOPY  x5   SATURATION BIOPSY OF PROSTATE  05-29-2007  and 01-26-2008   SHOULDER ARTHROSCOPY WITH OPEN ROTATOR CUFF REPAIR AND DISTAL  CLAVICLE ACROMINECTOMY Right 11/19/2004   TOTAL HIP ARTHROPLASTY Right 05/21/2009   TOTAL KNEE ARTHROPLASTY Bilateral left 05-02-2003/  right  03-23-2010   TRANSTHORACIC ECHOCARDIOGRAM  12/23/2004   ef 60%, mild MV calcification without stenosis/  mild TR,  PASP 73mHg   UVULOPALATOPHARYNGOPLASTY  1993    w/  T & A    SOCIAL HISTORY: Social History   Socioeconomic History   Marital status: Widowed    Spouse name: Not on file   Number of children: 2   Years of education: Not on file   Highest education level: Not on file  Occupational History   Occupation: retired    [employer] farm manager RETIRED  Tobacco Use   Smoking status: Former    Types: Pipe    Quit date: 01/18/1966    Years since quitting: 55.5   Smokeless tobacco: Never  Vaping Use   Vaping Use: Never used  Substance and Sexual Activity   Alcohol use: No    Alcohol/week: 0.0 standard drinks of alcohol   Drug use: No   Sexual activity: Not Currently  Other Topics Concern   Not on file  Social History  Narrative   DNR   Widowed   2 daughters leave near by   Social Determinants of Health   Financial Resource Strain: Not on file  Food Insecurity: Not on file  Transportation Needs: Not on file  Physical Activity: Not on file  Stress: Not on file  Social Connections: Not on file  Intimate Partner Violence: Not on file    FAMILY HISTORY: Family History  Problem Relation Age of Onset   Peripheral vascular disease Mother    Colon cancer Father 477  Breast cancer Daughter    Breast cancer Daughter     ALLERGIES:  is allergic to penicillins and tape.  MEDICATIONS:  Current Outpatient Medications  Medication Sig Dispense Refill   acetaminophen (TYLENOL) 325 MG tablet Take 650 mg by mouth every 6 (six) hours as needed for mild pain or headache.     Artificial Tear Solution (TEARS NATURALE OP) Place 1-2 drops into both eyes at bedtime as needed (dry eyes).     doxycycline (VIBRA-TABS) 100 MG tablet Take 1 tablet (100 mg total) by mouth 2 (two) times daily. 20 tablet 0   Ensure (ENSURE) Take 237 mLs by mouth daily. Chocolate     GEMTESA 75 MG TABS Take 1 tablet by mouth daily.     sertraline (ZOLOFT) 50 MG tablet Take 1 tablet (50 mg total) by mouth daily. For anxiety and depression 90 tablet 1   tamsulosin (FLOMAX) 0.4 MG CAPS capsule Take 2 capsules (0.8 mg total) by mouth daily. For urine flow 180 capsule 0   traZODone (DESYREL) 50 MG tablet Take 0.5-1 tablets (25-50 mg total) by mouth at bedtime. For sleep. 90 tablet 0   No current facility-administered medications for this visit.    REVIEW OF SYSTEMS:   Constitutional: ( - ) fevers, ( - )  chills , ( - ) night sweats Eyes: ( - ) blurriness of vision, ( - ) double vision, ( - ) watery eyes Ears, nose, mouth, throat, and face: ( - ) mucositis, ( - ) sore throat Respiratory: ( - ) cough, ( - ) dyspnea, ( - ) wheezes Cardiovascular: ( - ) palpitation, ( - ) chest discomfort, ( - ) lower extremity swelling Gastrointestinal:  ( -  ) nausea, ( - ) heartburn, ( - ) change in bowel habits Skin: ( - )  abnormal skin rashes Lymphatics: ( - ) new lymphadenopathy, ( - ) easy bruising Neurological: ( - ) numbness, ( - ) tingling, ( - ) new weaknesses Behavioral/Psych: ( - ) mood change, ( - ) new changes  All other systems were reviewed with the patient and are negative.  PHYSICAL EXAMINATION:  Vitals:   08/04/21 1117  BP: 126/63  Pulse: 73  Resp: 15  Temp: 98.1 F (36.7 C)  SpO2: 98%   Filed Weights   08/04/21 1117  Weight: 130 lb 12.8 oz (59.3 kg)    GENERAL: Chronically ill-appearing elderly Caucasian male in NAD  SKIN: skin color, texture, turgor are normal, no rashes or significant lesions EYES: conjunctiva are pink and non-injected, sclera clear LUNGS: clear to auscultation and percussion with normal breathing effort HEART: regular rate & rhythm and no murmurs and no lower extremity edema Musculoskeletal: no cyanosis of digits and no clubbing  PSYCH: alert & oriented x 3, fluent speech NEURO: no focal motor/sensory deficits  LABORATORY DATA:  I have reviewed the data as listed    Latest Ref Rng & Units 08/04/2021   12:26 PM 07/17/2021    1:25 PM 07/15/2021   11:28 AM  CBC  WBC 4.0 - 10.5 K/uL 8.3  13.2  21.4 Repeated and verified X2.   Hemoglobin 13.0 - 17.0 g/dL 14.6  15.3  15.3   Hematocrit 39.0 - 52.0 % 44.8  48.0  47.1   Platelets 150 - 400 K/uL 42  38  31.0 Repeated and verified X2.        Latest Ref Rng & Units 08/04/2021   12:26 PM 07/17/2021    1:25 PM 07/15/2021   11:28 AM  CMP  Glucose 70 - 99 mg/dL 95  111    BUN 8 - 23 mg/dL 15  13    Creatinine 0.61 - 1.24 mg/dL 0.95  1.11    Sodium 135 - 145 mmol/L 141  142    Potassium 3.5 - 5.1 mmol/L 4.4  4.6    Chloride 98 - 111 mmol/L 108  105    CO2 22 - 32 mmol/L 30  29    Calcium 8.9 - 10.3 mg/dL 9.4  8.8    Total Protein 6.5 - 8.1 g/dL 5.6  5.7  5.8   Total Bilirubin 0.3 - 1.2 mg/dL 1.0  1.1  1.2   Alkaline Phos 38 - 126 U/L 68  76   92   AST 15 - 41 U/L _0 ALT 0 - 44 U/L _1 ASSESSMENT & PLAN [name] 88 y.o. male with medical history significant for longstanding monocytosis and thrombocytosis previously followed by Dr. Beryle Beams and Dr. Tasia Catchings now transitioning care to Dr. Lorenso [name] due to geographical proximity.  After review of the labs, review of the records, and discussion with the patient the patients findings are most consistent with thrombocytosis and monocytosis of unclear etiology, though CMML would be a likely possibility.  # Monocytosis # Thrombocytosis -- Patient notes he would like to avoid bone marrow biopsy and any invasive treatments. --Discussed options moving forward and patient noted he would like supportive care only.  That would entail transfusions if his counts were to drop too low. --Platelets typically remain greater than 30.  Recommend transfusing for hemoglobin less than 8 or platelets of less than 20 --Plan to have the patient return to clinic in 3 months time for repeat  lab checks.  If he were to change his mind 1 bone marrow biopsy would be happy to see him back sooner. --Plan for labs in 3 months time with clinic visit in 6 months.  Orders Placed This Encounter  Procedures   CBC with Differential ([place] Only)    Standing Status:   Future    Number of Occurrences:   1    Standing Expiration Date:   08/05/2022   CMP ([place] only)    Standing Status:   Future    Number of Occurrences:   1    Standing Expiration Date:   08/05/2022   Lactate dehydrogenase (LDH)    Standing Status:   Future    Number of Occurrences:   1    Standing Expiration Date:   08/04/2022   Sample to Blood Bank    Standing Status:   Future    Number of Occurrences:   1    Standing Expiration Date:   08/05/2022   ABO/RH    Standing Status:   Future    Number of Occurrences:   1    Standing Expiration Date:   08/04/2022    All questions were answered. The patient knows to  call the clinic with any problems, questions or concerns.  A total of more than 40 minutes were spent on this encounter with face-to-face time and non-face-to-face time, including preparing to see the patient, ordering tests and/or medications, counseling the patient and coordination of care as outlined above.   Ledell Peoples, MD Department of Hematology/Oncology [place] at [facility] Phone: [phone] Pager: [phone] Email: [email or web address]  08/04/2021 2:40 PM

## 2021-08-04 ENCOUNTER — Inpatient Hospital Stay: Payer: Medicare HMO | Attending: Oncology | Admitting: Hematology and Oncology

## 2021-08-04 ENCOUNTER — Inpatient Hospital Stay: Payer: Medicare HMO

## 2021-08-04 ENCOUNTER — Other Ambulatory Visit: Payer: Self-pay

## 2021-08-04 VITALS — BP 126/63 | HR 73 | Temp 98.1°F | Resp 15 | Wt 130.8 lb

## 2021-08-04 DIAGNOSIS — Z79899 Other long term (current) drug therapy: Secondary | ICD-10-CM | POA: Diagnosis not present

## 2021-08-04 DIAGNOSIS — D72821 Monocytosis (symptomatic): Secondary | ICD-10-CM | POA: Diagnosis not present

## 2021-08-04 DIAGNOSIS — D696 Thrombocytopenia, unspecified: Secondary | ICD-10-CM

## 2021-08-04 DIAGNOSIS — D72829 Elevated white blood cell count, unspecified: Secondary | ICD-10-CM | POA: Diagnosis not present

## 2021-08-04 DIAGNOSIS — D75839 Thrombocytosis, unspecified: Secondary | ICD-10-CM | POA: Diagnosis not present

## 2021-08-04 DIAGNOSIS — Z8616 Personal history of COVID-19: Secondary | ICD-10-CM | POA: Insufficient documentation

## 2021-08-04 LAB — CMP (CANCER CENTER ONLY)
ALT: 13 U/L (ref 0–44)
AST: 15 U/L (ref 15–41)
Albumin: 4 g/dL (ref 3.5–5.0)
Alkaline Phosphatase: 68 U/L (ref 38–126)
Anion gap: 3 — ABNORMAL LOW (ref 5–15)
BUN: 15 mg/dL (ref 8–23)
CO2: 30 mmol/L (ref 22–32)
Calcium: 9.4 mg/dL (ref 8.9–10.3)
Chloride: 108 mmol/L (ref 98–111)
Creatinine: 0.95 mg/dL (ref 0.61–1.24)
GFR, Estimated: >60 mL/min (ref >60)
Glucose, Bld: 95 mg/dL (ref 70–99)
Potassium: 4.4 mmol/L (ref 3.5–5.1)
Sodium: 141 mmol/L (ref 135–145)
Total Bilirubin: 1 mg/dL (ref 0.3–1.2)
Total Protein: 5.6 g/dL — ABNORMAL LOW (ref 6.5–8.1)

## 2021-08-04 LAB — CBC WITH DIFFERENTIAL (CANCER CENTER ONLY)
Abs Immature Granulocytes: 0.36 10*3/uL — ABNORMAL HIGH (ref 0.00–0.07)
Basophils Absolute: 0.1 10*3/uL (ref 0.0–0.1)
Basophils Relative: 1 %
Eosinophils Absolute: 0.1 10*3/uL (ref 0.0–0.5)
Eosinophils Relative: 1 %
HCT: 44.8 % (ref 39.0–52.0)
Hemoglobin: 14.6 g/dL (ref 13.0–17.0)
Immature Granulocytes: 4 %
Lymphocytes Relative: 14 %
Lymphs Abs: 1.2 10*3/uL (ref 0.7–4.0)
MCH: 27.1 pg (ref 26.0–34.0)
MCHC: 32.6 g/dL (ref 30.0–36.0)
MCV: 83.1 fL (ref 80.0–100.0)
Monocytes Absolute: 2.2 10*3/uL — ABNORMAL HIGH (ref 0.1–1.0)
Monocytes Relative: 26 %
Neutro Abs: 4.4 10*3/uL (ref 1.7–7.7)
Neutrophils Relative %: 54 %
Platelet Count: 42 10*3/uL — ABNORMAL LOW (ref 150–400)
RBC: 5.39 MIL/uL (ref 4.22–5.81)
RDW: 16.4 % — ABNORMAL HIGH (ref 11.5–15.5)
Smear Review: NORMAL
WBC Count: 8.3 10*3/uL (ref 4.0–10.5)
nRBC: 0 % (ref 0.0–0.2)

## 2021-08-04 LAB — LACTATE DEHYDROGENASE: LDH: 161 U/L (ref 98–192)

## 2021-08-04 LAB — ABO/RH: ABO/RH(D): B POS

## 2021-08-04 LAB — SAMPLE TO BLOOD BANK

## 2021-08-04 NOTE — Telephone Encounter (Signed)
I know that I have already signed this, but I will sign again. Placed in [place]

## 2021-08-04 NOTE — Telephone Encounter (Signed)
Received no call back. Must have been addressed. Will address if any call back regarding this form.

## 2021-08-04 NOTE — Addendum Note (Signed)
Addended by: Pleas Koch on: 08/04/2021 06:48 PM   Modules accepted: Orders

## 2021-08-04 NOTE — Telephone Encounter (Signed)
Noted, referral placed. Please notify patient and daughter.

## 2021-08-04 NOTE — Telephone Encounter (Signed)
Have you seen ppw?

## 2021-08-05 ENCOUNTER — Telehealth: Payer: Self-pay | Admitting: Primary Care

## 2021-08-05 ENCOUNTER — Telehealth: Payer: Self-pay | Admitting: Hematology and Oncology

## 2021-08-05 ENCOUNTER — Telehealth: Payer: Self-pay | Admitting: *Deleted

## 2021-08-05 NOTE — Telephone Encounter (Signed)
Ppw has been re faxed

## 2021-08-05 NOTE — Telephone Encounter (Signed)
TCT patient's daughter, [name].  Spoke with her and advised that her father's platelet count is 42. Advised that there is no need for platelet transfusion at this time. His HGB is normal @ 14.6. Advised that we will check his labs every 3 months as he has been stable with this issue for several years. [name] voiced understanding. Advised to call before the 3 month appt if she has questions or concerns.  She said she definitely would. Reviewed upcoming appts with her.

## 2021-08-05 NOTE — Telephone Encounter (Signed)
-----   Message from Orson Slick, MD sent at 08/05/2021  9:33 AM EDT ----- Please let [name] know (via his daughter) that his Plt count is 42. There is no need for platelet transition at this time. We will continue to monitor his labs and transfuse if needed.   ----- Message ----- From: Buel Ream, [facility] [place] Sent: 08/04/2021  12:45 PM EDT To: Orson Slick, MD

## 2021-08-05 NOTE — Telephone Encounter (Signed)
Scheduled per 7/18 los, pt has been called and confirmed

## 2021-08-05 NOTE — Telephone Encounter (Signed)
Tonya from [facility] called and stated that he had a fall last week and want to see if the life alert has been issued. Call back number [phone].

## 2021-08-05 NOTE — Telephone Encounter (Signed)
Daughter informed will let us know if any questions.

## 2021-08-06 NOTE — Telephone Encounter (Signed)
Called and lvm for Tonya to call us back.

## 2021-08-06 NOTE — Telephone Encounter (Signed)
[name] called office back. I advised that is not something we normally process because insurance does not cover. She will let Patient daughter know. If there is anything further we can help with she will give [place] a call.

## 2021-08-11 ENCOUNTER — Telehealth: Payer: Self-pay

## 2021-08-11 NOTE — Telephone Encounter (Signed)
Tonya OT with [facility] is with pt; pt complaining of burning and frequency of urine and lower back pain on both sides of back with pain level of 2 that started on 08/06/21.pt has dizziness on and off where room spins and generally feeling weak. Pt has slight H/A. No CP or SOB and no abd pain. No fever. Tonya took vitals BP 108/54 P 109 but pt had been up walking prior to taking vitals. Pt does not appear in any distress and pt does not want to go to UC today.offered pt multiple appts on 08/12/21 and pt declined. Pt said he will call his daughter and see what time she can take pt to doctor's appt on 08/12/21 and will cb for appt. UC & ED precautions given and Kenney Houseman said pt voiced understanding. Sending note to Gentry Fitz NP and [facility] CMA.

## 2021-08-11 NOTE — Telephone Encounter (Signed)
Noted and agree with the recommendation for evaluation.

## 2021-08-12 ENCOUNTER — Ambulatory Visit (INDEPENDENT_AMBULATORY_CARE_PROVIDER_SITE_OTHER): Payer: Medicare HMO | Admitting: Family

## 2021-08-12 ENCOUNTER — Encounter: Payer: Self-pay | Admitting: Family

## 2021-08-12 VITALS — BP 124/70 | HR 55 | Temp 98.0°F | Resp 16 | Ht 60.0 in | Wt 131.5 lb

## 2021-08-12 DIAGNOSIS — N3000 Acute cystitis without hematuria: Secondary | ICD-10-CM

## 2021-08-12 DIAGNOSIS — R410 Disorientation, unspecified: Secondary | ICD-10-CM | POA: Diagnosis not present

## 2021-08-12 DIAGNOSIS — R300 Dysuria: Secondary | ICD-10-CM | POA: Diagnosis not present

## 2021-08-12 MED ORDER — CIPROFLOXACIN HCL 500 MG PO TABS: 500.0000 mg | ORAL_TABLET | Freq: Two times a day (BID) | ORAL | 0 refills | Status: DC

## 2021-08-12 NOTE — Assessment & Plan Note (Addendum)
antbx sent to pharmacy, pt to take as directed. Encouraged increased water intake throughout the day. Urine culture/reflex pending results upon receiving specimen. Choosing to treat due to being symptomatic. If no improvement in the next 2 days pt advised to let me know.

## 2021-08-12 NOTE — Progress Notes (Signed)
Established Patient Office Visit  Subjective:  Patient ID: MECHEL SCHUTTER, male    DOB: 09-Apr-1933  Age: 88 y.o. MRN: [id number]  CC:  Chief Complaint  Patient presents with   Urinary Tract Infection    Back hurting, feeling like he cant empty bladder, confused    HPI KERY BATZEL is here today with concerns.   Started with low back pain yesterday, and burning sensation when he urinates. Hard to get urine all out as well. A little more confused more than normal.   Woke up at 4 am standing in the middle of the room not sure where he was.  No fever or chills.   Past Medical History:  Diagnosis Date   Arthralgia of multiple joints    limited mobility w/  independant adl's   BPH with obstruction/lower urinary tract symptoms    Chronic idiopathic monocytosis    followed by dr Waymon Budge--  per lov note 06/ 2017 persistant unexplained  with normal bone barrow bx   Coronary atherosclerosis of unspecified type of vessel, native or graft    cardiologist-  dr Stanford Breed -- per lov note 11-19-2014 ,  cardiac cath in 2000-- pLAD 60-70%,  small intermediate branch 70-80%,  mRCA 20%,  normal LV   Degenerative arthritis of spine    cervical and lumar   Diverticulosis of colon (without mention of hemorrhage)    Dysuria    chronic   Elevated PSA    Feeling of incomplete bladder emptying    Hiatal hernia    moderate per ct 11/ 2017   History of adenomatous polyp of colon    2008   History of atrial fibrillation    remote hx episode   History of COVID-19 03/05/2020   History of pancreatitis    07-01-2013  and 10-10-2015   History of prostatitis    2014   Hx of colonic polyps    Hyperlipidemia    Hypertension    Mouth sore    roof of mouth sore    Nephrolithiasis    Nocturia more than twice per night    severe  w/ leakage   OSA (obstructive sleep apnea)    INTOLERANT CPAP   Osteoarthritis    Pernicious anemia    B12  Def.   Peyronie disease    Renal insufficiency     Sepsis ([place]) 03/22/2017   Thrombocytopenia, unspecified ([place]) hemotology/oncologist-  dr Waymon Budge   per dr Velta Addison note 06/ 2016  secondary to vitro clumping   Vitamin B deficiency     Past Surgical History:  Procedure Laterality Date   BONE MARROW BIOPSY  2011   normal   CARDIAC CATHETERIZATION  2000   per dr Kathyrn Drown note --  dLAD 60-70%,  small intermediate branch 70-80%,  mRCA 20%,  normal LV   CARDIOVASCULAR STRESS TEST  01-29-2013   dr Stanford Breed   normal nuclear study w/ no ischemia,  normal LV function and wall motion , ef 82%   CATARACT EXTRACTION W/ INTRAOCULAR LENS  IMPLANT, BILATERAL  08/2015   CHOLECYSTECTOMY  11/24/2010   Procedure: LAPAROSCOPIC CHOLECYSTECTOMY WITH INTRAOPERATIVE CHOLANGIOGRAM;  Surgeon: Earnstine Regal, MD;  Location: [facility];  Service: General;  Laterality: N/A;  c-arm   CYSTOSCOPY W/ URETERAL STENT PLACEMENT Left 12/17/2015   Procedure: CYSTOSCOPY WITH RETROGRADE PYELOGRAM/URETERAL STENT PLACEMENT;  Surgeon: Ardis Hughs, MD;  Location: [facility];  Service: Urology;  Laterality: Left;   CYSTOSCOPY WITH RETROGRADE PYELOGRAM, URETEROSCOPY AND  STENT PLACEMENT Left 12/22/2015   Procedure: CYSTOSCOPY WITH LEFT RETROGRADE  URETEROSCOPY AND STENT PLACEMENT;  Surgeon: Carolan Clines, MD;  Location: [facility];  Service: Urology;  Laterality: Left;   ERCP N/A 07/04/2013   Procedure: ENDOSCOPIC RETROGRADE CHOLANGIOPANCREATOGRAPHY (ERCP);  Surgeon: Gatha Mayer, MD;  Location: [facility];  Service: Endoscopy;  Laterality: N/A;  MAC if available   ERCP N/A 10/12/2015   Procedure: ENDOSCOPIC RETROGRADE CHOLANGIOPANCREATOGRAPHY (ERCP);  Surgeon: [name], MD;  Location: [facility];  Service: Endoscopy;  Laterality: N/A;   HOLMIUM LASER APPLICATION Left 60/04/5407   Procedure: HOLMIUM LASER APPLICATION;  Surgeon: Carolan Clines, MD;  Location: [facility];  Service: Urology;  Laterality: Left;   INGUINAL HERNIA  REPAIR Right 01/25/2002   KNEE ARTHROSCOPY  x5   SATURATION BIOPSY OF PROSTATE  05-29-2007  and 01-26-2008   SHOULDER ARTHROSCOPY WITH OPEN ROTATOR CUFF REPAIR AND DISTAL CLAVICLE ACROMINECTOMY Right 11/19/2004   TOTAL HIP ARTHROPLASTY Right 05/21/2009   TOTAL KNEE ARTHROPLASTY Bilateral left 05-02-2003/  right  03-23-2010   TRANSTHORACIC ECHOCARDIOGRAM  12/23/2004   ef 60%, mild MV calcification without stenosis/  mild TR,  PASP 38mHg   UVULOPALATOPHARYNGOPLASTY  1993    w/  T & A    Family History  Problem Relation Age of Onset   Peripheral vascular disease Mother    Colon cancer Father 484  Breast cancer Daughter    Breast cancer Daughter     Social History   Socioeconomic History   Marital status: Widowed    Spouse name: Not on file   Number of children: 2   Years of education: Not on file   Highest education level: Not on file  Occupational History   Occupation: retired    [employer] farm manager RETIRED  Tobacco Use   Smoking status: Former    Types: Pipe    Quit date: 01/18/1966    Years since quitting: 55.6   Smokeless tobacco: Never  Vaping Use   Vaping Use: Never used  Substance and Sexual Activity   Alcohol use: No    Alcohol/week: 0.0 standard drinks of alcohol   Drug use: No   Sexual activity: Not Currently  Other Topics Concern   Not on file  Social History Narrative   DNR   Widowed   2 daughters leave near by   Social Determinants of Health   Financial Resource Strain: Not on file  Food Insecurity: Not on file  Transportation Needs: Not on file  Physical Activity: Not on file  Stress: Not on file  Social Connections: Not on file  Intimate Partner Violence: Not on file    Outpatient Medications Prior to Visit  Medication Sig Dispense Refill   acetaminophen (TYLENOL) 325 MG tablet Take 650 mg by mouth every 6 (six) hours as needed for mild pain or headache.     Artificial Tear Solution (TEARS NATURALE OP) Place 1-2 drops into both eyes at bedtime as  needed (dry eyes).     Ensure (ENSURE) Take 237 mLs by mouth daily. Chocolate     GEMTESA 75 MG TABS Take 1 tablet by mouth daily.     sertraline (ZOLOFT) 50 MG tablet Take 1 tablet (50 mg total) by mouth daily. For anxiety and depression 90 tablet 1   tamsulosin (FLOMAX) 0.4 MG CAPS capsule Take 2 capsules (0.8 mg total) by mouth daily. For urine flow 180 capsule 0   traZODone (DESYREL) 50 MG tablet Take 0.5-1 tablets (25-50 mg  total) by mouth at bedtime. For sleep. 90 tablet 0   doxycycline (VIBRA-TABS) 100 MG tablet Take 1 tablet (100 mg total) by mouth 2 (two) times daily. (Patient not taking: Reported on 08/12/2021) 20 tablet 0   No facility-administered medications prior to visit.    Allergies  Allergen Reactions   Penicillins Hives and Other (See Comments)    Whelps, passed out Tolerates cephalosporins  Has patient had a PCN reaction causing immediate rash, facial/tongue/throat swelling, SOB or lightheadedness with hypotension:  yes Has patient had a PCN reaction causing severe rash involving mucus membranes or skin necrosis: no Has patient had a PCN reaction that required hospitalization: no Has patient had a PCN reaction occurring within the last 10 years: no If all of the above answers are "NO", then may proceed with Cephalosporin use.    Tape Hives    PAPER TAPE        Objective:    Physical Exam Vitals reviewed.  Constitutional:      General: He is not in acute distress.    Appearance: Normal appearance. He is normal weight. He is not ill-appearing, toxic-appearing or diaphoretic.  Cardiovascular:     Rate and Rhythm: Normal rate and regular rhythm.  Pulmonary:     Effort: Pulmonary effort is normal.     Breath sounds: Normal breath sounds.  Abdominal:     General: Abdomen is flat.     Tenderness: There is abdominal tenderness (mild bil suprapubic tenderness). There is no right CVA tenderness or left CVA tenderness.  Neurological:     General: No focal deficit  present.     Mental Status: He is alert and oriented to person, place, and time. Mental status is at baseline.     Motor: No weakness.     Gait: Gait normal.  Psychiatric:        Mood and Affect: Mood normal.        Behavior: Behavior normal.        Thought Content: Thought content normal.        Judgment: Judgment normal.     BP 124/70   Pulse (!) 55   Temp 98 F (36.7 C)   Resp 16   Ht 5' (1.524 m)   Wt 131 lb 8 oz (59.6 kg)   SpO2 96%   BMI 25.68 kg/m  Wt Readings from Last 3 Encounters:  08/12/21 131 lb 8 oz (59.6 kg)  08/04/21 130 lb 12.8 oz (59.3 kg)  07/20/21 131 lb (59.4 kg)     Health Maintenance Due  Topic Date Due   Zoster Vaccines- Shingrix (1 of 2) Never done    There are no preventive care reminders to display for this patient.  Lab Results  Component Value Date   TSH 0.917 07/17/2021   Lab Results  Component Value Date   WBC 8.3 08/04/2021   HGB 14.6 08/04/2021   HCT 44.8 08/04/2021   MCV 83.1 08/04/2021   PLT 42 (L) 08/04/2021   Lab Results  Component Value Date   NA 141 08/04/2021   K 4.4 08/04/2021   CO2 30 08/04/2021   GLUCOSE 95 08/04/2021   BUN 15 08/04/2021   CREATININE 0.95 08/04/2021   BILITOT 1.0 08/04/2021   ALKPHOS 68 08/04/2021   AST 15 08/04/2021   ALT 13 08/04/2021   PROT 5.6 (L) 08/04/2021   ALBUMIN 4.0 08/04/2021   CALCIUM 9.4 08/04/2021   ANIONGAP 3 (L) 08/04/2021   GFR 42.47 (L) 06/10/2021  Lab Results  Component Value Date   HGBA1C 5.2 07/01/2017      Assessment & Plan:   Problem List Items Addressed This Visit       Nervous and Auditory   Confusion     Genitourinary   Acute cystitis without hematuria    antbx sent to pharmacy, pt to take as directed. Encouraged increased water intake throughout the day. Urine culture/reflex pending results. Choosing to treat due to being symptomatic. If no improvement in the next 2 days pt advised to let me know.       Relevant Medications   ciprofloxacin  (CIPRO) 500 MG tablet     Other   Dysuria - Primary    poct urine dip in office, however pt unable  to void Took home specimen cup pt will return with specimen Urine culture ordered but also pending specimen       Relevant Orders   Urine Culture   POCT Urinalysis Dipstick (Automated)    Meds ordered this encounter  Medications   ciprofloxacin (CIPRO) 500 MG tablet    Sig: Take 1 tablet (500 mg total) by mouth 2 (two) times daily for 7 days.    Dispense:  14 tablet    Refill:  0    Order Specific Question:   Supervising Provider    Answer:   BEDSOLE, AMY E [[id number]    Follow-up: No follow-ups on file.    Eugenia Pancoast, FNP

## 2021-08-12 NOTE — Assessment & Plan Note (Signed)
poct urine dip in office, however pt unable  to void Took home specimen cup pt will return with specimen Urine culture ordered but also pending specimen

## 2021-08-13 ENCOUNTER — Encounter: Payer: Self-pay | Admitting: Family

## 2021-08-13 DIAGNOSIS — R300 Dysuria: Secondary | ICD-10-CM | POA: Diagnosis not present

## 2021-08-13 LAB — POC URINALSYSI DIPSTICK (AUTOMATED)
Bilirubin, UA: POSITIVE
Blood, UA: NEGATIVE
Glucose, UA: NEGATIVE
Ketones, UA: NEGATIVE
Nitrite, UA: NEGATIVE
Protein, UA: POSITIVE — AB
Spec Grav, UA: 1.015 (ref 1.010–1.025)
Urobilinogen, UA: 0.2 E.U./dL
pH, UA: 5.5 (ref 5.0–8.0)

## 2021-08-14 ENCOUNTER — Ambulatory Visit: Payer: Medicare HMO | Admitting: Primary Care

## 2021-08-15 LAB — URINE CULTURE
MICRO NUMBER:: 13702997
SPECIMEN QUALITY:: ADEQUATE

## 2021-08-18 ENCOUNTER — Encounter: Payer: Self-pay | Admitting: Primary Care

## 2021-08-18 ENCOUNTER — Ambulatory Visit (INDEPENDENT_AMBULATORY_CARE_PROVIDER_SITE_OTHER): Payer: Medicare HMO | Admitting: Primary Care

## 2021-08-18 ENCOUNTER — Ambulatory Visit: Payer: Medicare HMO | Admitting: Primary Care

## 2021-08-18 VITALS — BP 126/82 | HR 87 | Temp 98.6°F | Wt 131.0 lb

## 2021-08-18 DIAGNOSIS — H938X3 Other specified disorders of ear, bilateral: Secondary | ICD-10-CM | POA: Diagnosis not present

## 2021-08-18 DIAGNOSIS — J309 Allergic rhinitis, unspecified: Secondary | ICD-10-CM | POA: Diagnosis not present

## 2021-08-18 DIAGNOSIS — R630 Anorexia: Secondary | ICD-10-CM | POA: Insufficient documentation

## 2021-08-18 DIAGNOSIS — M25511 Pain in right shoulder: Secondary | ICD-10-CM

## 2021-08-18 DIAGNOSIS — G8929 Other chronic pain: Secondary | ICD-10-CM | POA: Diagnosis not present

## 2021-08-18 DIAGNOSIS — F331 Major depressive disorder, recurrent, moderate: Secondary | ICD-10-CM | POA: Diagnosis not present

## 2021-08-18 DIAGNOSIS — G4700 Insomnia, unspecified: Secondary | ICD-10-CM

## 2021-08-18 DIAGNOSIS — M25512 Pain in left shoulder: Secondary | ICD-10-CM | POA: Diagnosis not present

## 2021-08-18 DIAGNOSIS — R42 Dizziness and giddiness: Secondary | ICD-10-CM

## 2021-08-18 MED ORDER — DICLOFENAC SODIUM 1 % EX GEL: 2.0000 g | Freq: Three times a day (TID) | CUTANEOUS | 3 refills | Status: AC | PRN

## 2021-08-18 MED ORDER — FLUTICASONE PROPIONATE 50 MCG/ACT NA SUSP: 1.0000 | Freq: Two times a day (BID) | NASAL | 3 refills | Status: AC

## 2021-08-18 MED ORDER — LORATADINE 10 MG PO TABS: 10.0000 mg | ORAL_TABLET | Freq: Every day | ORAL | 3 refills | Status: AC

## 2021-08-18 MED ORDER — MIRTAZAPINE 7.5 MG PO TABS: 7.5000 mg | ORAL_TABLET | Freq: Every day | ORAL | 0 refills | Status: DC

## 2021-08-18 NOTE — Assessment & Plan Note (Signed)
Changing regimen to help with appetite.  Stop Trazodone 50 mg HS. Start mirtazapine 7.5 mg HS.  His family will update in a few weeks.

## 2021-08-18 NOTE — Progress Notes (Signed)
Subjective:    Patient ID: [name], male    DOB: October 19, 1933, 88 y.o.   MRN: [id number]  Back Pain  Ear Fullness     [name] is a very pleasant 88 y.o. male with a history of hypertension, sleep apnea, GERD, ascending cholangitis, BPH, thrombocytopenia, hyperlipidemia, elevated PSA, anxiety and depression who presents today to discuss ear fullness, dizziness, and shoulder pain.  1) Chronic Shoulder Pain: Chronic to the left shoulder and posterior shoulder. Acute increase in left shoulder, neck, and upper extremity pain since this morning. He was visited by the occupational therapist yesterday who worked with him on some upper body strength exercises.  He does not practice his occupational exercises outside of his therapist visiting. Evaluated by Dr. Lorelei Pont in June 2023, received cortisone injection to the left shoulder and has noticed improvement overall. He does not take anything regularly for his shoulder pain.    2) Ear Fullness: Chronic to bilateral ears, left worse than right. He wears his hearing aids daily. Also with intermittent vertigo symptoms, "feels like the room is spinning". His current hearting aids are about 86 years old, he's not had his hearing aids checked in years.   He is inconsistent with use of Claritin and Flonase.   3) Insomnia/Anxiety/Depression: Currently managed on Trazodone 25-50 mg HS for sleep, sertraline 50 mg daily. His daughters endorse a poor appetite, hardly eats anything of substance despite have plenty of freshly prepared meals that are provided by his family.   His taste remains altered since Covid-19 infection, only sweet food is enjoyable.    Review of Systems  Constitutional:  Positive for appetite change.  HENT:         Ear Fullness  Musculoskeletal:  Positive for arthralgias and myalgias.  Neurological:  Positive for dizziness.  Psychiatric/Behavioral:  Positive for sleep disturbance.          Past Medical History:   Diagnosis Date   Arthralgia of multiple joints    limited mobility w/  independant adl's   BPH with obstruction/lower urinary tract symptoms    Chronic idiopathic monocytosis    followed by dr Waymon Budge--  per lov note 06/ 2017 persistant unexplained  with normal bone barrow bx   Coronary atherosclerosis of unspecified type of vessel, native or graft    cardiologist-  dr Stanford Breed -- per lov note 11-19-2014 ,  cardiac cath in 2000-- pLAD 60-70%,  small intermediate branch 70-80%,  mRCA 20%,  normal LV   Degenerative arthritis of spine    cervical and lumar   Diverticulosis of colon (without mention of hemorrhage)    Dysuria    chronic   Elevated PSA    Feeling of incomplete bladder emptying    Hiatal hernia    moderate per ct 11/ 2017   History of adenomatous polyp of colon    2008   History of atrial fibrillation    remote hx episode   History of COVID-19 03/05/2020   History of pancreatitis    07-01-2013  and 10-10-2015   History of prostatitis    2014   Hx of colonic polyps    Hyperlipidemia    Hypertension    Mouth sore    roof of mouth sore    Nephrolithiasis    Nocturia more than twice per night    severe  w/ leakage   OSA (obstructive sleep apnea)    INTOLERANT CPAP   Osteoarthritis    Pernicious anemia  B12  Def.   Peyronie disease    Renal insufficiency    Sepsis ([place]) 03/22/2017   Thrombocytopenia, unspecified ([place]) hemotology/oncologist-  dr Waymon Budge   per dr Velta Addison note 06/ 2016  secondary to vitro clumping   Vitamin B deficiency     Social History   Socioeconomic History   Marital status: Widowed    Spouse name: Not on file   Number of children: 2   Years of education: Not on file   Highest education level: Not on file  Occupational History   Occupation: retired    Fish farm manager: RETIRED  Tobacco Use   Smoking status: Former    Types: Pipe    Quit date: 01/18/1966    Years since quitting: 55.6   Smokeless tobacco: Never  Vaping  Use   Vaping Use: Never used  Substance and Sexual Activity   Alcohol use: No    Alcohol/week: 0.0 standard drinks of alcohol   Drug use: No   Sexual activity: Not Currently  Other Topics Concern   Not on file  Social History Narrative   DNR   Widowed   2 daughters leave near by   Social Determinants of Health   Financial Resource Strain: Not on file  Food Insecurity: Not on file  Transportation Needs: Not on file  Physical Activity: Not on file  Stress: Not on file  Social Connections: Not on file  Intimate Partner Violence: Not on file    Past Surgical History:  Procedure Laterality Date   BONE MARROW BIOPSY  2011   normal   CARDIAC CATHETERIZATION  2000   per dr Kathyrn Drown note --  dLAD 60-70%,  small intermediate branch 70-80%,  mRCA 20%,  normal LV   CARDIOVASCULAR STRESS TEST  01-29-2013   dr Stanford Breed   normal nuclear study w/ no ischemia,  normal LV function and wall motion , ef 82%   CATARACT EXTRACTION W/ INTRAOCULAR LENS  IMPLANT, BILATERAL  08/2015   CHOLECYSTECTOMY  11/24/2010   Procedure: LAPAROSCOPIC CHOLECYSTECTOMY WITH INTRAOPERATIVE CHOLANGIOGRAM;  Surgeon: Earnstine Regal, MD;  Location: [facility];  Service: General;  Laterality: N/A;  c-arm   CYSTOSCOPY W/ URETERAL STENT PLACEMENT Left 12/17/2015   Procedure: CYSTOSCOPY WITH RETROGRADE PYELOGRAM/URETERAL STENT PLACEMENT;  Surgeon: Ardis Hughs, MD;  Location: [facility];  Service: Urology;  Laterality: Left;   CYSTOSCOPY WITH RETROGRADE PYELOGRAM, URETEROSCOPY AND STENT PLACEMENT Left 12/22/2015   Procedure: CYSTOSCOPY WITH LEFT RETROGRADE  URETEROSCOPY AND STENT PLACEMENT;  Surgeon: Carolan Clines, MD;  Location: [facility];  Service: Urology;  Laterality: Left;   ERCP N/A 07/04/2013   Procedure: ENDOSCOPIC RETROGRADE CHOLANGIOPANCREATOGRAPHY (ERCP);  Surgeon: Gatha Mayer, MD;  Location: [facility];  Service: Endoscopy;  Laterality: N/A;  MAC if available   ERCP N/A 10/12/2015    Procedure: ENDOSCOPIC RETROGRADE CHOLANGIOPANCREATOGRAPHY (ERCP);  Surgeon: [name], MD;  Location: [facility];  Service: Endoscopy;  Laterality: N/A;   HOLMIUM LASER APPLICATION Left 09/25/1189   Procedure: HOLMIUM LASER APPLICATION;  Surgeon: Carolan Clines, MD;  Location: [facility];  Service: Urology;  Laterality: Left;   INGUINAL HERNIA REPAIR Right 01/25/2002   KNEE ARTHROSCOPY  x5   SATURATION BIOPSY OF PROSTATE  05-29-2007  and 01-26-2008   SHOULDER ARTHROSCOPY WITH OPEN ROTATOR CUFF REPAIR AND DISTAL CLAVICLE ACROMINECTOMY Right 11/19/2004   TOTAL HIP ARTHROPLASTY Right 05/21/2009   TOTAL KNEE ARTHROPLASTY Bilateral left 05-02-2003/  right  03-23-2010   TRANSTHORACIC ECHOCARDIOGRAM  12/23/2004  ef 60%, mild MV calcification without stenosis/  mild TR,  PASP 35mHg   UVULOPALATOPHARYNGOPLASTY  1993    w/  T & A    Family History  Problem Relation Age of Onset   Peripheral vascular disease Mother    Colon cancer Father 47  Breast cancer Daughter    Breast cancer Daughter     Allergies  Allergen Reactions   Penicillins Hives and Other (See Comments)    Whelps, passed out Tolerates cephalosporins  Has patient had a PCN reaction causing immediate rash, facial/tongue/throat swelling, SOB or lightheadedness with hypotension:  yes Has patient had a PCN reaction causing severe rash involving mucus membranes or skin necrosis: no Has patient had a PCN reaction that required hospitalization: no Has patient had a PCN reaction occurring within the last 10 years: no If all of the above answers are "NO", then may proceed with Cephalosporin use.    Tape Hives    PAPER TAPE    Current Outpatient Medications on File Prior to Visit  Medication Sig Dispense Refill   acetaminophen (TYLENOL) 325 MG tablet Take 650 mg by mouth every 6 (six) hours as needed for mild pain or headache.     Artificial Tear Solution (TEARS NATURALE OP) Place 1-2 drops into both eyes at  bedtime as needed (dry eyes).     Ensure (ENSURE) Take 237 mLs by mouth daily. Chocolate     GEMTESA 75 MG TABS Take 1 tablet by mouth daily.     sertraline (ZOLOFT) 50 MG tablet Take 1 tablet (50 mg total) by mouth daily. For anxiety and depression 90 tablet 1   tamsulosin (FLOMAX) 0.4 MG CAPS capsule Take 2 capsules (0.8 mg total) by mouth daily. For urine flow 180 capsule 0   No current facility-administered medications on file prior to visit.    BP 126/82   Pulse 87   Temp 98.6 F (37 C) (Oral)   Wt 131 lb (59.4 kg)   SpO2 100%   BMI 25.58 kg/m  Objective:   Physical Exam HENT:     Right Ear: Ear canal normal. Tympanic membrane is not erythematous.     Left Ear: Tympanic membrane and ear canal normal. Tympanic membrane is not erythematous.     Ears:     Comments: Small amount of fluid to right TM Cardiovascular:     Rate and Rhythm: Normal rate and regular rhythm.  Pulmonary:     Effort: Pulmonary effort is normal.     Breath sounds: Normal breath sounds. No wheezing or rales.  Musculoskeletal:     Cervical back: Neck supple.  Skin:    General: Skin is warm and dry.  Neurological:     Mental Status: He is alert and oriented to person, place, and time.           Assessment & Plan:   Problem List Items Addressed This Visit       Respiratory   Allergic rhinitis    Resume Claritin 10 mg daily. Consider Xyzal  5 mg if no improvement.       Relevant Medications   loratadine (CLARITIN) 10 MG tablet     Nervous and Auditory   Sensation of fullness in both ears - Primary    Ear exam today with small amount of fluid.  Start Flonase BID and Claritin daily. He will also follow up with audiology       Relevant Medications   fluticasone (FLONASE) 50 MCG/ACT nasal spray  Other   Depression    Continue sertraline 50 mg. Stop Trazodone 50 mg.  Start mirtazipine 7.5 mg HS to help with sleep, appetite, depression. His family will update in a few weeks  regarding sleep and appetite.      Relevant Medications   mirtazapine (REMERON) 7.5 MG tablet   Dizziness    Seems more vertigo-like.  Encouraged to resume Flonase and use daily. Resume Claritin daily.  He will follow up with ENT/audiology.  No acute stroke symptoms noted      Chronic shoulder pain   Relevant Medications   mirtazapine (REMERON) 7.5 MG tablet   diclofenac Sodium (VOLTAREN ARTHRITIS PAIN) 1 % GEL   Insomnia    Changing regimen to help with appetite.  Stop Trazodone 50 mg HS. Start mirtazapine 7.5 mg HS.  His family will update in a few weeks.      Relevant Medications   mirtazapine (REMERON) 7.5 MG tablet   Decreased appetite    Trial of Mirtazipine 7.5 mg sent to pharmacy. Family will update in a few weeks.  Stop Trazodone 50 mg HS.      Relevant Medications   mirtazapine (REMERON) 7.5 MG tablet       Pleas Koch, NP

## 2021-08-18 NOTE — Assessment & Plan Note (Signed)
Trial of Mirtazipine 7.5 mg sent to pharmacy. Family will update in a few weeks.  Stop Trazodone 50 mg HS.

## 2021-08-18 NOTE — Assessment & Plan Note (Signed)
Resume Claritin 10 mg daily. Consider Xyzal  5 mg if no improvement.

## 2021-08-18 NOTE — Assessment & Plan Note (Addendum)
Ear exam today with small amount of fluid.  Start Flonase BID and Claritin daily. He will also follow up with audiology

## 2021-08-18 NOTE — Patient Instructions (Addendum)
Stop taking Trazodone for sleep.  Start mirtazapine 7.5 mg at bedtime for sleep and appetite.   You can apply diclofenac gel to your shoulder three times daily as needed for pain. Consider setting up another visit with Dr. Lorelei Pont for your shoulder.  Nasal Congestion/Ear Pressure: Try using Flonase (fluticasone) nasal spray. Instill 1 spray in each nostril twice daily.   Resume your claritin daily.   It was a pleasure to see you today!

## 2021-08-18 NOTE — Assessment & Plan Note (Signed)
Seems more vertigo-like.  Encouraged to resume Flonase and use daily. Resume Claritin daily.  He will follow up with ENT/audiology.  No acute stroke symptoms noted

## 2021-08-18 NOTE — Assessment & Plan Note (Signed)
Continue sertraline 50 mg. Stop Trazodone 50 mg.  Start mirtazipine 7.5 mg HS to help with sleep, appetite, depression. His family will update in a few weeks regarding sleep and appetite.

## 2021-08-21 ENCOUNTER — Ambulatory Visit (INDEPENDENT_AMBULATORY_CARE_PROVIDER_SITE_OTHER): Payer: Medicare HMO

## 2021-08-21 VITALS — Ht 60.0 in | Wt 131.0 lb

## 2021-08-21 DIAGNOSIS — Z0000 Encounter for general adult medical examination without abnormal findings: Secondary | ICD-10-CM

## 2021-08-21 NOTE — Progress Notes (Signed)
I connected with [name] today by telephone and verified that I am speaking with the correct person using two identifiers. Location patient: home Location provider: work Persons participating in the virtual visit: [name].   I discussed the limitations, risks, security and privacy concerns of performing an evaluation and management service by telephone and the availability of in person appointments. I also discussed with the patient that there may be a patient responsible charge related to this service. The patient expressed understanding and verbally consented to this telephonic visit.    Interactive audio and video telecommunications were attempted between this provider and patient, however failed, due to patient having technical difficulties OR patient did not have access to video capability.  We continued and completed visit with audio only.     Vital signs may be patient reported or missing.  Subjective:   [name] is a 88 y.o. male who presents for Medicare Annual/Subsequent preventive examination.  Review of Systems     Cardiac Risk Factors include: advanced age (>72mn, >>52women);dyslipidemia;hypertension;male gender     Objective:    Today's Vitals   08/21/21 1327  Weight: 131 lb (59.4 kg)  Height: 5' (1.524 m)   Body mass index is 25.58 kg/m.     08/21/2021    1:41 PM 07/17/2021   12:45 PM 07/03/2021    8:46 AM 06/13/2021   12:00 AM 06/12/2021    7:16 PM 02/14/2021    2:41 PM 12/29/2018    2:40 PM  Advanced Directives  Does Patient Have a Medical Advance Directive? Yes Yes No Yes Yes Yes No  Type of [place]  Living will;Out of facility DNR (pink MOST or yellow form) Living will;Out of facility DNR (pink MOST or yellow form) Out of facility DNR (pink MOST or yellow form)   Does patient want to make changes to medical advance directive?     No - Patient declined  Yes (ED - send information to MyChart)   Copy of [place]? Yes - validated most recent copy scanned in chart (See row information)        Would patient like information on creating a medical advance directive?       No - Patient declined    Current Medications (verified) Outpatient Encounter Medications as of 08/21/2021  Medication Sig   acetaminophen (TYLENOL) 325 MG tablet Take 650 mg by mouth every 6 (six) hours as needed for mild pain or headache.   Artificial Tear Solution (TEARS NATURALE OP) Place 1-2 drops into both eyes at bedtime as needed (dry eyes).   diclofenac Sodium (VOLTAREN ARTHRITIS PAIN) 1 % GEL Apply 2 g topically 3 (three) times daily as needed.   Ensure (ENSURE) Take 237 mLs by mouth daily. Chocolate   fluticasone (FLONASE) 50 MCG/ACT nasal spray Place 1 spray into both nostrils 2 (two) times daily.   GEMTESA 75 MG TABS Take 1 tablet by mouth daily.   loratadine (CLARITIN) 10 MG tablet Take 1 tablet (10 mg total) by mouth daily. For allergies   mirtazapine (REMERON) 7.5 MG tablet Take 1 tablet (7.5 mg total) by mouth at bedtime. For sleep and appetite   sertraline (ZOLOFT) 50 MG tablet Take 1 tablet (50 mg total) by mouth daily. For anxiety and depression   tamsulosin (FLOMAX) 0.4 MG CAPS capsule Take 2 capsules (0.8 mg total) by mouth daily. For urine flow  No facility-administered encounter medications on file as of 08/21/2021.    Allergies (verified) Penicillins and Tape   History: Past Medical History:  Diagnosis Date   Arthralgia of multiple joints    limited mobility w/  independant adl's   BPH with obstruction/lower urinary tract symptoms    Chronic idiopathic monocytosis    followed by dr Waymon Budge--  per lov note 06/ 2017 persistant unexplained  with normal bone barrow bx   Coronary atherosclerosis of unspecified type of vessel, native or graft    cardiologist-  dr Stanford Breed -- per lov note 11-19-2014 ,   cardiac cath in 2000-- pLAD 60-70%,  small intermediate branch 70-80%,  mRCA 20%,  normal LV   Degenerative arthritis of spine    cervical and lumar   Diverticulosis of colon (without mention of hemorrhage)    Dysuria    chronic   Elevated PSA    Feeling of incomplete bladder emptying    Hiatal hernia    moderate per ct 11/ 2017   History of adenomatous polyp of colon    2008   History of atrial fibrillation    remote hx episode   History of COVID-19 03/05/2020   History of pancreatitis    07-01-2013  and 10-10-2015   History of prostatitis    2014   Hx of colonic polyps    Hyperlipidemia    Hypertension    Mouth sore    roof of mouth sore    Nephrolithiasis    Nocturia more than twice per night    severe  w/ leakage   OSA (obstructive sleep apnea)    INTOLERANT CPAP   Osteoarthritis    Pernicious anemia    B12  Def.   Peyronie disease    Renal insufficiency    Sepsis ([place]) 03/22/2017   Thrombocytopenia, unspecified ([place]) hemotology/oncologist-  dr Waymon Budge   per dr Velta Addison note 06/ 2016  secondary to vitro clumping   Vitamin B deficiency    Past Surgical History:  Procedure Laterality Date   BONE MARROW BIOPSY  2011   normal   CARDIAC CATHETERIZATION  2000   per dr Kathyrn Drown note --  dLAD 60-70%,  small intermediate branch 70-80%,  mRCA 20%,  normal LV   CARDIOVASCULAR STRESS TEST  01-29-2013   dr Stanford Breed   normal nuclear study w/ no ischemia,  normal LV function and wall motion , ef 82%   CATARACT EXTRACTION W/ INTRAOCULAR LENS  IMPLANT, BILATERAL  08/2015   CHOLECYSTECTOMY  11/24/2010   Procedure: LAPAROSCOPIC CHOLECYSTECTOMY WITH INTRAOPERATIVE CHOLANGIOGRAM;  Surgeon: Earnstine Regal, MD;  Location: [facility];  Service: General;  Laterality: N/A;  c-arm   CYSTOSCOPY W/ URETERAL STENT PLACEMENT Left 12/17/2015   Procedure: CYSTOSCOPY WITH RETROGRADE PYELOGRAM/URETERAL STENT PLACEMENT;  Surgeon: Ardis Hughs, MD;  Location: [facility];  Service:  Urology;  Laterality: Left;   CYSTOSCOPY WITH RETROGRADE PYELOGRAM, URETEROSCOPY AND STENT PLACEMENT Left 12/22/2015   Procedure: CYSTOSCOPY WITH LEFT RETROGRADE  URETEROSCOPY AND STENT PLACEMENT;  Surgeon: Carolan Clines, MD;  Location: [facility];  Service: Urology;  Laterality: Left;   ERCP N/A 07/04/2013   Procedure: ENDOSCOPIC RETROGRADE CHOLANGIOPANCREATOGRAPHY (ERCP);  Surgeon: Gatha Mayer, MD;  Location: [facility];  Service: Endoscopy;  Laterality: N/A;  MAC if available   ERCP N/A 10/12/2015   Procedure: ENDOSCOPIC RETROGRADE CHOLANGIOPANCREATOGRAPHY (ERCP);  Surgeon: [name], MD;  Location: [facility];  Service: Endoscopy;  Laterality: N/A;   HOLMIUM LASER APPLICATION  Left 12/22/2015   Procedure: HOLMIUM LASER APPLICATION;  Surgeon: Sigmund Tannenbaum, MD;  Location: [facility];  Service: Urology;  Laterality: Left;   INGUINAL HERNIA REPAIR Right 01/25/2002   KNEE ARTHROSCOPY  x5   SATURATION BIOPSY OF PROSTATE  05-29-2007  and 01-26-2008   SHOULDER ARTHROSCOPY WITH OPEN ROTATOR CUFF REPAIR AND DISTAL CLAVICLE ACROMINECTOMY Right 11/19/2004   TOTAL HIP ARTHROPLASTY Right 05/21/2009   TOTAL KNEE ARTHROPLASTY Bilateral left 05-02-2003/  right  03-23-2010   TRANSTHORACIC ECHOCARDIOGRAM  12/23/2004   ef 60%, mild MV calcification without stenosis/  mild TR,  PASP 36mmHg   UVULOPALATOPHARYNGOPLASTY  1993    w/  T & A   Family History  Problem Relation Age of Onset   Peripheral vascular disease Mother    Colon cancer Father 41   Breast cancer Daughter    Breast cancer Daughter    Social History   Socioeconomic History   Marital status: Widowed    Spouse name: Not on file   Number of children: 2   Years of education: Not on file   Highest education level: Not on file  Occupational History   Occupation: retired    Employer: RETIRED  Tobacco Use   Smoking status: Former    Types: Pipe    Quit date: 01/18/1966    Years since quitting:  55.6   Smokeless tobacco: Never  Vaping Use   Vaping Use: Never used  Substance and Sexual Activity   Alcohol use: No    Alcohol/week: 0.0 standard drinks of alcohol   Drug use: No   Sexual activity: Not Currently  Other Topics Concern   Not on file  Social History Narrative   DNR   Widowed   2 daughters leave near by   Social Determinants of Health   Financial Resource Strain: Low Risk  (08/21/2021)   Overall Financial Resource Strain (CARDIA)    Difficulty of Paying Living Expenses: Not hard at all  Food Insecurity: No Food Insecurity (08/21/2021)   Hunger Vital Sign    Worried About Running Out of Food in the Last Year: Never true    Ran Out of Food in the Last Year: Never true  Transportation Needs: No Transportation Needs (08/21/2021)   PRAPARE - Transportation    Lack of Transportation (Medical): No    Lack of Transportation (Non-Medical): No  Physical Activity: Inactive (08/21/2021)   Exercise Vital Sign    Days of Exercise per Week: 0 days    Minutes of Exercise per Session: 0 min  Stress: Stress Concern Present (08/21/2021)   [employer] - Occupational Stress Questionnaire    Feeling of Stress : To some extent  Social Connections: Not on file    Tobacco Counseling Counseling given: Not Answered   Clinical Intake:  Pre-visit preparation completed: Yes  Pain : No/denies pain     Nutritional Status: BMI 25 -29 Overweight Nutritional Risks: None Diabetes: No  How often do you need to have someone help you when you read instructions, pamphlets, or other written materials from your doctor or pharmacy?: 1 - Never  Diabetic?no  Interpreter Needed?: No  Information entered by :: NAllen [name]   Activities of Daily Living    08/21/2021    1:47 PM 06/13/2021   12:00 AM  In your present state of health, do you have any difficulty performing the following activities:  Hearing? 0 0  Vision? 0 0  Difficulty concentrating or making  decisions? 1   0  Walking or climbing stairs? 0 1  Dressing or bathing? 0 0  Doing errands, shopping? 1 0  Preparing Food and eating ? N   Using the Toilet? N   In the past six months, have you accidently leaked urine? N   Do you have problems with loss of bowel control? N   Managing your Medications? Y   Comment daughters set up   Managing your Finances? N   Housekeeping or managing your Housekeeping? N     Patient Care Team: Clark, Katherine K, NP as PCP - General (Internal Medicine) Granfortuna, James M, MD as Consulting Physician (Oncology) Tannenbaum, Sigmund, MD (Inactive) as Consulting Physician (Urology) Brodie, Dora M, MD (Inactive) as Consulting Physician (Gastroenterology) Crenshaw, Brian S, MD as Consulting Physician (Cardiology) Finnegan, Timothy J, MD as Consulting Physician (Oncology)  Indicate any recent Medical Services you may have received from other than [facility] providers in the past year (date may be approximate).     Assessment:   This is a routine wellness examination for Johneric.  Hearing/Vision screen Vision Screening - Comments:: Regular eye exams, [facility]  Dietary issues and exercise activities discussed: Current Exercise Habits: The patient does not participate in regular exercise at present   Goals Addressed             This Visit's Progress    Patient Stated       08/21/2021, no goals       Depression Screen    08/21/2021    1:43 PM 06/25/2021   10:51 AM 03/17/2021    9:24 AM 03/05/2020    9:41 AM 01/24/2020    9:18 AM 07/17/2018   10:23 AM 07/01/2017   10:19 AM  PHQ 2/9 Scores  PHQ - 2 Score 3 6 3 3 0 1 6  PHQ- 9 Score 15 23 12 14  1 22    Fall Risk    08/21/2021    1:41 PM 03/17/2021    9:26 AM 01/24/2020    9:18 AM 07/17/2018   10:23 AM 07/01/2017   10:19 AM  Fall Risk   Falls in the past year? 1 1 0 1 Yes  Comment    multiple falls due to balance   Number falls in past yr: 1 1 0 1 2 or more  Injury with Fall? 1 1 0 0 No   Risk Factor Category      High Fall Risk  Risk for fall due to : Impaired balance/gait;Impaired mobility;Medication side effect History of fall(s);Impaired balance/gait;Impaired mobility   Impaired balance/gait;Impaired mobility  Follow up Falls evaluation completed;Education provided;Falls prevention discussed Falls evaluation completed Falls evaluation completed      FALL RISK PREVENTION PERTAINING TO THE HOME:  Any stairs in or around the home? Yes  If so, are there any without handrails? No  Home free of loose throw rugs in walkways, pet beds, electrical cords, etc? Yes  Adequate lighting in your home to reduce risk of falls? Yes   ASSISTIVE DEVICES UTILIZED TO PREVENT FALLS:  Life alert? No  Use of a cane, walker or w/c? Yes  Grab bars in the bathroom? Yes  Shower chair or bench in shower? Yes  Elevated toilet seat or a handicapped toilet? No   TIMED UP AND GO:  Was the test performed? No .  .     Cognitive Function:    07/17/2018   10:24 AM 07/01/2017   10:15 AM 06/29/2016    9:15 AM    MMSE - Mini Mental State Exam  Orientation to time 5 5 5  Orientation to Place 5 5 5  Registration 3 3 3  Attention/ Calculation 0 0 0  Recall 3 3 3  Language- name 2 objects 0 0 0  Language- repeat 1 1 1  Language- follow 3 step command 0 3 3  Language- read & follow direction 0 0 0  Write a sentence 0 0 0  Copy design 0 0 0  Total score 17 20 20        Immunizations Immunization History  Administered Date(s) Administered   Influenza Split 11/12/2010, 10/26/2011   Influenza Whole 11/14/2007, 10/24/2008, 10/16/2009   Influenza, High Dose Seasonal PF 10/19/2018, 10/19/2019   Influenza,inj,Quad PF,6+ Mos 09/21/2012, 09/06/2013, 10/24/2014, 12/30/2015, 11/24/2016, 11/03/2017   PFIZER(Purple Top)SARS-COV-2 Vaccination 02/19/2019, 03/12/2019   Pneumococcal Conjugate-13 09/06/2013   Pneumococcal Polysaccharide-23 01/11/2007   Td 05/05/2011   Tdap 02/14/2021   Zoster, Live  03/13/2007    TDAP status: Up to date  Flu Vaccine status: Due, Education has been provided regarding the importance of this vaccine. Advised may receive this vaccine at local pharmacy or Health Dept. Aware to provide a copy of the vaccination record if obtained from local pharmacy or Health Dept. Verbalized acceptance and understanding.  Pneumococcal vaccine status: Up to date  Covid-19 vaccine status: Completed vaccines  Qualifies for Shingles Vaccine? No   Zostavax completed Yes   Shingrix Completed?: No.    Education has been provided regarding the importance of this vaccine. Patient has been advised to call insurance company to determine out of pocket expense if they have not yet received this vaccine. Advised may also receive vaccine at local pharmacy or Health Dept. Verbalized acceptance and understanding.  Screening Tests Health Maintenance  Topic Date Due   Zoster Vaccines- Shingrix (1 of 2) Never done   INFLUENZA VACCINE  08/18/2021   TETANUS/TDAP  02/15/2031   Pneumonia Vaccine 65+ Years old  Completed   HPV VACCINES  Aged Out   COVID-19 Vaccine  Discontinued    Health Maintenance  Health Maintenance Due  Topic Date Due   Zoster Vaccines- Shingrix (1 of 2) Never done   INFLUENZA VACCINE  08/18/2021    Colorectal cancer screening: No longer required.   Lung Cancer Screening: (Low Dose CT Chest recommended if Age 55-80 years, 30 pack-year currently smoking OR have quit w/in 15years.) does not qualify.   Lung Cancer Screening Referral: no  Additional Screening:  Hepatitis C Screening: does not qualify;   Vision Screening: Recommended annual ophthalmology exams for early detection of glaucoma and other disorders of the eye. Is the patient up to date with their annual eye exam?  Yes  Who is the provider or what is the name of the office in which the patient attends annual eye exams? [facility] If pt is not established with a provider, would they like to  be referred to a provider to establish care? No .   Dental Screening: Recommended annual dental exams for proper oral hygiene  Community Resource Referral / Chronic Care Management: CRR required this visit?  No   CCM required this visit?  No      Plan:     I have personally reviewed and noted the following in the patient's chart:   Medical and social history Use of alcohol, tobacco or illicit drugs  Current medications and supplements including opioid prescriptions. Patient is not currently taking opioid prescriptions. Functional ability and status Nutritional   status Physical activity Advanced directives List of other physicians Hospitalizations, surgeries, and ER visits in previous 12 months Vitals Screenings to include cognitive, depression, and falls Referrals and appointments  In addition, I have reviewed and discussed with patient certain preventive protocols, quality metrics, and best practice recommendations. A written personalized care plan for preventive services as well as general preventive health recommendations were provided to patient.      E , [name]   08/21/2021   Nurse Notes: 6 CIT not administered. Declined  Due to this being a virtual visit, the after visit summary with patients personalized plan was offered to patient via mail or my-chart.  Patient would like to access on my-chart     

## 2021-08-21 NOTE — Patient Instructions (Signed)
[name] , Thank you for taking time to come for your Medicare Wellness Visit. I appreciate your ongoing commitment to your health goals. Please review the following plan we discussed and let me know if I can assist you in the future.   Screening recommendations/referrals: Colonoscopy: not required Recommended yearly ophthalmology/optometry visit for glaucoma screening and checkup Recommended yearly dental visit for hygiene and checkup  Vaccinations: Influenza vaccine: due Pneumococcal vaccine: completed 09/06/2013 Tdap vaccine: completed 02/14/2021, due 02/15/2031 Shingles vaccine: discussed   Covid-19:  03/12/2019, 02/19/2019  Advanced directives: copy in chart  Conditions/risks identified: none  Next appointment: Follow up in one year for your annual wellness visit.   Preventive Care 86 Years and Older, Male Preventive care refers to lifestyle choices and visits with your health care provider that can promote health and wellness. What does preventive care include? A yearly physical exam. This is also called an annual well check. Dental exams once or twice a year. Routine eye exams. Ask your health care provider how often you should have your eyes checked. Personal lifestyle choices, including: Daily care of your teeth and gums. Regular physical activity. Eating a healthy diet. Avoiding tobacco and drug use. Limiting alcohol use. Practicing safe sex. Taking low doses of aspirin every day. Taking vitamin and mineral supplements as recommended by your health care provider. What happens during an annual well check? The services and screenings done by your health care provider during your annual well check will depend on your age, overall health, lifestyle risk factors, and family history of disease. Counseling  Your health care provider may ask you questions about your: Alcohol use. Tobacco use. Drug use. Emotional well-being. Home and relationship well-being. Sexual  activity. Eating habits. History of falls. Memory and ability to understand (cognition). Work and work Statistician. Screening  You may have the following tests or measurements: Height, weight, and BMI. Blood pressure. Lipid and cholesterol levels. These may be checked every 5 years, or more frequently if you are over 60 years old. Skin check. Lung cancer screening. You may have this screening every year starting at age 86 if you have a 30-pack-year history of smoking and currently smoke or have quit within the past 15 years. Fecal occult blood test (FOBT) of the stool. You may have this test every year starting at age 86. Flexible sigmoidoscopy or colonoscopy. You may have a sigmoidoscopy every 5 years or a colonoscopy every 10 years starting at age 86. Prostate cancer screening. Recommendations will vary depending on your family history and other risks. Hepatitis C blood test. Hepatitis B blood test. Sexually transmitted disease (STD) testing. Diabetes screening. This is done by checking your blood sugar (glucose) after you have not eaten for a while (fasting). You may have this done every 1-3 years. Abdominal aortic aneurysm (AAA) screening. You may need this if you are a current or former smoker. Osteoporosis. You may be screened starting at age 86 if you are at high risk. Talk with your health care provider about your test results, treatment options, and if necessary, the need for more tests. Vaccines  Your health care provider may recommend certain vaccines, such as: Influenza vaccine. This is recommended every year. Tetanus, diphtheria, and acellular pertussis (Tdap, Td) vaccine. You may need a Td booster every 10 years. Zoster vaccine. You may need this after age 55. Pneumococcal 13-valent conjugate (PCV13) vaccine. One dose is recommended after age 86. Pneumococcal polysaccharide (PPSV23) vaccine. One dose is recommended after age 86. Talk to your  health care provider about which  screenings and vaccines you need and how often you need them. This information is not intended to replace advice given to you by your health care provider. Make sure you discuss any questions you have with your health care provider. Document Released: 01/31/2015 Document Revised: 09/24/2015 Document Reviewed: 11/05/2014 Elsevier Interactive Patient Education  2017 [employer] Prevention in the Home Falls can cause injuries. They can happen to people of all ages. There are many things you can do to make your home safe and to help prevent falls. What can I do on the outside of my home? Regularly fix the edges of walkways and driveways and fix any cracks. Remove anything that might make you trip as you walk through a door, such as a raised step or threshold. Trim any bushes or trees on the path to your home. Use bright outdoor lighting. Clear any walking paths of anything that might make someone trip, such as rocks or tools. Regularly check to see if handrails are loose or broken. Make sure that both sides of any steps have handrails. Any raised decks and porches should have guardrails on the edges. Have any leaves, snow, or ice cleared regularly. Use sand or salt on walking paths during winter. Clean up any spills in your garage right away. This includes oil or grease spills. What can I do in the bathroom? Use night lights. Install grab bars by the toilet and in the tub and shower. Do not use towel bars as grab bars. Use non-skid mats or decals in the tub or shower. If you need to sit down in the shower, use a plastic, non-slip stool. Keep the floor dry. Clean up any water that spills on the floor as soon as it happens. Remove soap buildup in the tub or shower regularly. Attach bath mats securely with double-sided non-slip rug tape. Do not have throw rugs and other things on the floor that can make you trip. What can I do in the bedroom? Use night lights. Make sure that you have a  light by your bed that is easy to reach. Do not use any sheets or blankets that are too big for your bed. They should not hang down onto the floor. Have a firm chair that has side arms. You can use this for support while you get dressed. Do not have throw rugs and other things on the floor that can make you trip. What can I do in the kitchen? Clean up any spills right away. Avoid walking on wet floors. Keep items that you use a lot in easy-to-reach places. If you need to reach something above you, use a strong step stool that has a grab bar. Keep electrical cords out of the way. Do not use floor polish or wax that makes floors slippery. If you must use wax, use non-skid floor wax. Do not have throw rugs and other things on the floor that can make you trip. What can I do with my stairs? Do not leave any items on the stairs. Make sure that there are handrails on both sides of the stairs and use them. Fix handrails that are broken or loose. Make sure that handrails are as long as the stairways. Check any carpeting to make sure that it is firmly attached to the stairs. Fix any carpet that is loose or worn. Avoid having throw rugs at the top or bottom of the stairs. If you do have throw rugs, attach them  to the floor with carpet tape. Make sure that you have a light switch at the top of the stairs and the bottom of the stairs. If you do not have them, ask someone to add them for you. What else can I do to help prevent falls? Wear shoes that: Do not have high heels. Have rubber bottoms. Are comfortable and fit you well. Are closed at the toe. Do not wear sandals. If you use a stepladder: Make sure that it is fully opened. Do not climb a closed stepladder. Make sure that both sides of the stepladder are locked into place. Ask someone to hold it for you, if possible. Clearly mark and make sure that you can see: Any grab bars or handrails. First and last steps. Where the edge of each step  is. Use tools that help you move around (mobility aids) if they are needed. These include: Canes. Walkers. Scooters. Crutches. Turn on the lights when you go into a dark area. Replace any light bulbs as soon as they burn out. Set up your furniture so you have a clear path. Avoid moving your furniture around. If any of your floors are uneven, fix them. If there are any pets around you, be aware of where they are. Review your medicines with your doctor. Some medicines can make you feel dizzy. This can increase your chance of falling. Ask your doctor what other things that you can do to help prevent falls. This information is not intended to replace advice given to you by your health care provider. Make sure you discuss any questions you have with your health care provider. Document Released: 10/31/2008 Document Revised: 06/12/2015 Document Reviewed: 02/08/2014 Elsevier Interactive Patient Education  2017 [employer].

## 2021-08-28 ENCOUNTER — Telehealth: Payer: Self-pay

## 2021-08-28 ENCOUNTER — Encounter (HOSPITAL_COMMUNITY): Payer: Self-pay | Admitting: Emergency Medicine

## 2021-08-28 ENCOUNTER — Emergency Department (HOSPITAL_COMMUNITY)
Admission: EM | Admit: 2021-08-28 | Discharge: 2021-08-29 | Disposition: A | Discharge status: Home / Self Care | Payer: Medicare HMO | Attending: Emergency Medicine | Admitting: Emergency Medicine

## 2021-08-28 ENCOUNTER — Emergency Department (HOSPITAL_COMMUNITY): Payer: Medicare HMO

## 2021-08-28 ENCOUNTER — Other Ambulatory Visit: Payer: Self-pay

## 2021-08-28 DIAGNOSIS — D696 Thrombocytopenia, unspecified: Secondary | ICD-10-CM | POA: Insufficient documentation

## 2021-08-28 DIAGNOSIS — S70362A Insect bite (nonvenomous), left thigh, initial encounter: Secondary | ICD-10-CM | POA: Diagnosis not present

## 2021-08-28 DIAGNOSIS — R410 Disorientation, unspecified: Secondary | ICD-10-CM | POA: Insufficient documentation

## 2021-08-28 DIAGNOSIS — Y92009 Unspecified place in unspecified non-institutional (private) residence as the place of occurrence of the external cause: Secondary | ICD-10-CM | POA: Diagnosis not present

## 2021-08-28 DIAGNOSIS — W01198A Fall on same level from slipping, tripping and stumbling with subsequent striking against other object, initial encounter: Secondary | ICD-10-CM | POA: Insufficient documentation

## 2021-08-28 DIAGNOSIS — S79922A Unspecified injury of left thigh, initial encounter: Secondary | ICD-10-CM | POA: Diagnosis present

## 2021-08-28 DIAGNOSIS — S0990XA Unspecified injury of head, initial encounter: Secondary | ICD-10-CM | POA: Diagnosis not present

## 2021-08-28 DIAGNOSIS — W19XXXA Unspecified fall, initial encounter: Secondary | ICD-10-CM

## 2021-08-28 DIAGNOSIS — I739 Peripheral vascular disease, unspecified: Secondary | ICD-10-CM | POA: Diagnosis not present

## 2021-08-28 NOTE — Telephone Encounter (Signed)
Called daughter reviewed. She will not restart on trazodone.

## 2021-08-28 NOTE — ED Provider Triage Note (Signed)
Emergency Medicine Provider Triage Evaluation Note  CUTHBERT TURTON , a 88 y.o. male  was evaluated in triage.  Pt complains of fall.  Patient presents with his daughter who provides some of the history.  The patient daughter states that the patient fell earlier today striking the right side of his head.  The patient denies loss of consciousness or blood thinners.  The patient has a history of leukemia.  Patient states that he was down on the ground for no more than 2 minutes.  Review of Systems  Positive:  Negative:   Physical Exam  BP 111/68 (BP Location: Right Arm)   Pulse 84   Temp 99.2 F (37.3 C) (Oral)   Resp 20   SpO2 96%  Gen:   Awake, no distress   Resp:  Normal effort  MSK:   Moves extremities without difficulty  Other:  No focal neurodeficits on examination  Medical Decision Making  Medically screening exam initiated at 7:38 PM.  Appropriate orders placed.  Clearence Vitug Oakey was informed that the remainder of the evaluation will be completed by another provider, this initial triage assessment does not replace that evaluation, and the importance of remaining in the ED until their evaluation is complete.     Azucena Cecil, PA-C 08/28/21 [id number]

## 2021-08-28 NOTE — Telephone Encounter (Signed)
Called and spoke to daughter she will stop the mirtazapine. Wanted to know if it was ok to put him back on trazodone. She was on the way to a wedding rehearsal and will get evaluation as soon as she can.

## 2021-08-28 NOTE — Telephone Encounter (Signed)
[name] (DPR signed) said that pt would not answer his phone and she went to see pt and pt started with confusion after changing to mirtazapine but today pt mental status is significantly worse; pt told [name] someone took him to the doctor this morning and then pt changed and said someone came to his house and gave him a shot and then pt was talking about Christmas paper being in the TV and multiple other statements that were not accurate. Due to mental status changing significantly pt needs eval today at ED and possible imaging. [name] said I did not understand that she has to have a wedding dress at a rehearsal for her daughter at 4:30 today. I asked if there was anyone else that could take pt to ED and [name] said her sister has burst ear drum and cannot take pt. I advised could call EMS to take pt to ED and [name] asked if could I ask someone else what she could do besides going to ED. I spoke with Gentry Fitz NP and she said with pt's mental status changing that significantly pt needed eval at ED today for concern of pending stroke. I let [name] know Gentry Fitz NP instruction and she voiced understanding and said she would talk with her niece and see what she could get done. Sending note to Gentry Fitz NP and [facility] CMA.

## 2021-08-28 NOTE — Telephone Encounter (Signed)
Noted. Recommend he stop mirtazapine. He needs evaluation for stroke, sepsis, any other cause that could contribute to symptoms.

## 2021-08-28 NOTE — ED Triage Notes (Signed)
Patient lost his balance and fell at home this afternoon landed on carpeted floor , denies LOC , no anticoagulant , no injury or pain . Daughter adds intermittent confusion .

## 2021-08-28 NOTE — Telephone Encounter (Signed)
Noted. No, do not put him back on anything that could cause confusion or drowsiness.

## 2021-08-29 LAB — CBC WITH DIFFERENTIAL/PLATELET
Abs Immature Granulocytes: 0.27 10*3/uL — ABNORMAL HIGH (ref 0.00–0.07)
Basophils Absolute: 0.1 10*3/uL (ref 0.0–0.1)
Basophils Relative: 1 %
Eosinophils Absolute: 0.1 10*3/uL (ref 0.0–0.5)
Eosinophils Relative: 1 %
HCT: 43.2 % (ref 39.0–52.0)
Hemoglobin: 13.9 g/dL (ref 13.0–17.0)
Immature Granulocytes: 3 %
Lymphocytes Relative: 18 %
Lymphs Abs: 1.7 10*3/uL (ref 0.7–4.0)
MCH: 27.3 pg (ref 26.0–34.0)
MCHC: 32.2 g/dL (ref 30.0–36.0)
MCV: 84.7 fL (ref 80.0–100.0)
Monocytes Absolute: 2.1 10*3/uL — ABNORMAL HIGH (ref 0.1–1.0)
Monocytes Relative: 21 %
Neutro Abs: 5.6 10*3/uL (ref 1.7–7.7)
Neutrophils Relative %: 56 %
Platelets: 67 10*3/uL — ABNORMAL LOW (ref 150–400)
RBC: 5.1 MIL/uL (ref 4.22–5.81)
RDW: 15.9 % — ABNORMAL HIGH (ref 11.5–15.5)
WBC: 9.8 10*3/uL (ref 4.0–10.5)
nRBC: 0 % (ref 0.0–0.2)

## 2021-08-29 LAB — URINALYSIS, ROUTINE W REFLEX MICROSCOPIC
Bilirubin Urine: NEGATIVE
Glucose, UA: NEGATIVE mg/dL
Hgb urine dipstick: NEGATIVE
Ketones, ur: NEGATIVE mg/dL
Leukocytes,Ua: NEGATIVE
Nitrite: NEGATIVE
Protein, ur: NEGATIVE mg/dL
Specific Gravity, Urine: 1.011 (ref 1.005–1.030)
pH: 5 (ref 5.0–8.0)

## 2021-08-29 LAB — COMPREHENSIVE METABOLIC PANEL
ALT: 11 U/L (ref 0–44)
AST: 19 U/L (ref 15–41)
Albumin: 3.6 g/dL (ref 3.5–5.0)
Alkaline Phosphatase: 60 U/L (ref 38–126)
Anion gap: 7 (ref 5–15)
BUN: 8 mg/dL (ref 8–23)
CO2: 21 mmol/L — ABNORMAL LOW (ref 22–32)
Calcium: 8.5 mg/dL — ABNORMAL LOW (ref 8.9–10.3)
Chloride: 110 mmol/L (ref 98–111)
Creatinine, Ser: 0.91 mg/dL (ref 0.61–1.24)
GFR, Estimated: >60 mL/min (ref >60)
Glucose, Bld: 85 mg/dL (ref 70–99)
Potassium: 3.4 mmol/L — ABNORMAL LOW (ref 3.5–5.1)
Sodium: 138 mmol/L (ref 135–145)
Total Bilirubin: 1.1 mg/dL (ref 0.3–1.2)
Total Protein: 5 g/dL — ABNORMAL LOW (ref 6.5–8.1)

## 2021-08-29 NOTE — ED Notes (Signed)
Pt allowed to eat and drink per MD Zavitz.

## 2021-08-29 NOTE — Discharge Instructions (Addendum)
1.  Stop taking mirtazapine (Remeron).  This is a newer medication for you.  You have been experiencing some visual hallucinations and confusion that is atypical.  First stop taking the medication and see if symptoms improve. 2.  At this time continue taking sertraline (Zoloft).  It sounds as if you are tolerating this medication well in the first 4 to 6 weeks.  I suggest you continue this and see if it is effective but monitor closely for any changes in behavior or confusion. 3.  At your last visit with your family doctor, the recommendation was to stop trazodone and start Remeron.  Because you will be stopping the Remeron, I suggest using the trazodone on an as-needed basis for nighttime sleep for several days to see if it is effective without any additional confusion or hallucinations.  Particularly, tonight you may want to try the trazodone because you are up all night and may have difficulty at home with your rest cycle for the next couple of days. 4.  Try to follow a schedule and routine as closely as possible.  This can be helpful in managing confusion and dementia.  Depending on how your symptoms evolve and are managed on this plan.  Your doctor may consider starting Seroquel. 5.  See your doctor for recheck as soon as possible.  If you are not able to manage at home and symptoms are worsening or changing, return to the emergency department.

## 2021-08-29 NOTE — ED Notes (Signed)
AVS provided to and discussed with patient and family member at bedside. Pt verbalizes understanding of discharge instructions and denies any questions or concerns at this time. Pt has ride home. Pt ambulated out of department using cane without difficulty.

## 2021-08-29 NOTE — ED Provider Notes (Signed)
[name] Provider Note   CSN: [id number] Arrival date & time: 08/28/21  1816     History  Chief Complaint  Patient presents with   [name] is a 88 y.o. male.  Patient presents with his daughter after fall.  Patient recalls details.  Patient says he occasionally feels dizzy and brief fall episodes he feels due to his hearing aid.  Patient has history of leukemia and follows up outpatient.  Patient had brief fall on the ground for 1 to 2 minutes.  Patient hit the right side of his head without syncope.  No seizures.  Patient denies any extremity pain.  Patient has fatigue however says is due to age and has been tired and generally weak for some time.  Daughter reports separately that he has been more confused intermittently than baseline.  No fevers or chills.  No respiratory symptoms.  Patient was put on antidepressant fairly recently.  Patient and daughter have not noticed unilateral signs or symptoms.  No blood thinner use.       Home Medications Prior to Admission medications   Medication Sig Start Date End Date Taking? Authorizing Provider  acetaminophen (TYLENOL) 325 MG tablet Take 650 mg by mouth every 6 (six) hours as needed for mild pain or headache.    [provider]  Artificial Tear Solution (TEARS NATURALE OP) Place 1-2 drops into both eyes at bedtime as needed (dry eyes).    [provider]  diclofenac Sodium (VOLTAREN ARTHRITIS PAIN) 1 % GEL Apply 2 g topically 3 (three) times daily as needed. 08/18/21   Pleas Koch, NP  Ensure (ENSURE) Take 237 mLs by mouth daily. Chocolate    [provider]  fluticasone (FLONASE) 50 MCG/ACT nasal spray Place 1 spray into both nostrils 2 (two) times daily. 08/18/21   Pleas Koch, NP  GEMTESA 75 MG TABS Take 1 tablet by mouth daily. 07/31/21   [provider]  loratadine (CLARITIN) 10 MG tablet Take 1 tablet (10 mg total) by mouth  daily. For allergies 08/18/21   Pleas Koch, NP  mirtazapine (REMERON) 7.5 MG tablet Take 1 tablet (7.5 mg total) by mouth at bedtime. For sleep and appetite 08/18/21   Pleas Koch, NP  sertraline (ZOLOFT) 50 MG tablet Take 1 tablet (50 mg total) by mouth daily. For anxiety and depression 07/22/21   Pleas Koch, NP  tamsulosin (FLOMAX) 0.4 MG CAPS capsule Take 2 capsules (0.8 mg total) by mouth daily. For urine flow 07/15/21   Pleas Koch, NP      Allergies    Penicillins and Tape    Review of Systems   Review of Systems  Constitutional:  Negative for chills and fever.  HENT:  Negative for congestion.   Eyes:  Negative for visual disturbance.  Respiratory:  Negative for shortness of breath.   Cardiovascular:  Negative for chest pain.  Gastrointestinal:  Negative for abdominal pain and vomiting.  Genitourinary:  Negative for dysuria and flank pain.  Musculoskeletal:  Negative for back pain, neck pain and neck stiffness.  Skin:  Negative for rash.  Neurological:  Negative for light-headedness and headaches.    Physical Exam Updated Vital Signs BP 136/80   Pulse 82   Temp 97.8 F (36.6 C)   Resp (!) 24   SpO2 98%  Physical Exam Vitals and nursing note reviewed.  Constitutional:      General: He is  not in acute distress.    Appearance: He is well-developed.  HENT:     Head: Normocephalic and atraumatic.     Mouth/Throat:     Mouth: Mucous membranes are dry.  Eyes:     General:        Right eye: No discharge.        Left eye: No discharge.     Conjunctiva/sclera: Conjunctivae normal.  Neck:     Trachea: No tracheal deviation.  Cardiovascular:     Rate and Rhythm: Normal rate and regular rhythm.  Pulmonary:     Effort: Pulmonary effort is normal.     Breath sounds: Normal breath sounds.  Abdominal:     General: There is no distension.     Palpations: Abdomen is soft.     Tenderness: There is no abdominal tenderness. There is no guarding.   Musculoskeletal:        General: No swelling or tenderness.     Cervical back: Normal range of motion and neck supple. No rigidity.  Skin:    General: Skin is warm.     Capillary Refill: Capillary refill takes less than 2 seconds.     Comments: Patient has healing area from tick bite left inner thigh and no external signs of infection no warmth no induration.  Neurological:     General: No focal deficit present.     Mental Status: He is alert.     Comments: Patient has equal strength upper and lower extremities bilateral no arm drift, finger-nose intact.  Pupils equal extraocular muscle function intact.  Patient knows location, interacts properly with myself and daughter and discussion.  Psychiatric:     Comments: Patient interacts appropriately, answers questions, jokes and discussion.     ED Results / Procedures / Treatments   Labs (all labs ordered are listed, but only abnormal results are displayed) Labs Reviewed  URINE CULTURE  URINALYSIS, ROUTINE W REFLEX MICROSCOPIC  COMPREHENSIVE METABOLIC PANEL  CBC WITH DIFFERENTIAL/PLATELET    EKG None  Radiology CT Head Wo Contrast  Result Date: 08/28/2021 CLINICAL DATA:  Trauma, fall EXAM: CT HEAD WITHOUT CONTRAST TECHNIQUE: Contiguous axial images were obtained from the base of the skull through the vertex without intravenous contrast. RADIATION DOSE REDUCTION: This exam was performed according to the departmental dose-optimization program which includes automated exposure control, adjustment of the mA and/or kV according to patient size and/or use of iterative reconstruction technique. COMPARISON:  07/03/2021 FINDINGS: Brain: No acute intracranial findings are seen in noncontrast CT brain. Cortical sulci are prominent. There is decreased density in periventricular and subcortical white matter. Vascular: Unremarkable. Skull: Unremarkable. Sinuses/Orbits: Unremarkable. Other: No significant changes are noted. IMPRESSION: No acute  intracranial findings are seen in noncontrast CT brain. Atrophy. Small-vessel disease. Electronically Signed   By: Elmer Picker M.D.   On: 08/28/2021 20:22    Procedures Procedures    Medications Ordered in ED Medications - No data to display  ED Course/ Medical Decision Making/ A&P                           Medical Decision Making Amount and/or Complexity of Data Reviewed Labs: ordered.   Patient presents with mild intermittent confusion and a fall at home today.  Fortunately from a traumatic standpoint no signs of significant injury.  CT scan of the head ordered due to age/head injury and mild intermittent confusion results reviewed and no acute bleeding or fracture.  Discussed  in detail concerns with daughter and plan to check blood work check for electrolyte abnormalities, signs of anemia and urine infection.  Patient care will be signed out to follow-up results and reassess.  Patient has close outpatient follow-up.        Final Clinical Impression(s) / ED Diagnoses Final diagnoses:  Fall, initial encounter  Thrombocytopenia [facility])  Confusion    Rx / DC Orders ED Discharge Orders     None         Elnora Morrison, MD 08/29/21 [id number]

## 2021-08-29 NOTE — ED Provider Notes (Addendum)
Intermittent confusion. Physical Exam  BP (!) 133/58   Pulse 79   Temp 97.8 F (36.6 C)   Resp (!) 25   SpO2 98%   Physical Exam  Procedures  Procedures  ED Course / MDM    Medical Decision Making Amount and/or Complexity of Data Reviewed Labs: ordered.   Review results, reassess for d/c vs MRI for atypical stroke presentation.  I had an extensive discussion with the patient's daughter.  Patient's hallucinations at home include believing people are in the attic and believing they are taking the doors off the frames and putting up cardboard.  She reports that he does really well on questions of memory for older situations and of knowledge.  This extent of hallucination and behavior change started fairly recently within the past week or so.  We reviewed the patient's medications and a new or change was the addition of Remeron and the discontinuation of trazodone.  Patient had been on Zoloft for about 4 to 6 weeks without these types of hallucinations or confusion.  I speak with the patient he is alert and talkative and interactive.  He can make jokes and has fairly good insight.  He is not clinically ill in appearance.  Diagnostic work-up thus far has been negative.  Vital signs are normal.  Patient's daughter and I discussed the possibility of medications unmasking or inducing more severe dementia symptoms.  At this time my recommendation is to discontinue the Remeron and continue the Zoloft.  Have also advised that due to the immediate confusion to be expected from patient being up all night and having increased confusion and fatigue from his emergency department visit, she may give trazodone tonight for sleep.  Patient may need trazodone for several nights for sleep.  Family is to observe closely to see if discontinuing the Remeron and trying to get back on a normal sleep-wake cycle brings the patient back to his baseline.  I have recommended close follow-up with the PCP to monitor patient's  response to these changes.  I also discussed the possibility of Seroquel going forward but at this time, I recommend observing one change at a time to see how the patient responds.       Charlesetta Shanks, MD 08/29/21 0569    Charlesetta Shanks, MD 08/29/21 [phone]

## 2021-08-30 LAB — URINE CULTURE

## 2021-08-31 ENCOUNTER — Telehealth: Payer: Self-pay | Admitting: Primary Care

## 2021-08-31 NOTE — Telephone Encounter (Signed)
Home Health verbal orders [place] Agency Name: [facility] [place] number: [phone]  Requesting OT/PT/Skilled nursing/Social Work/Speech:Social Worker  Reason:increase in falling ,confusion and hallucination  Frequency:  Please forward to [facility] pool or providers CMA

## 2021-08-31 NOTE — Telephone Encounter (Signed)
Called [name] states that they were with patient all weekend he is not having any further symptoms. They are sure it was just the medications. They will give [place] a call once everyone gets up today to get follow up scheduled with Anda Kraft. No further questions or symptoms noted.

## 2021-09-01 ENCOUNTER — Ambulatory Visit (INDEPENDENT_AMBULATORY_CARE_PROVIDER_SITE_OTHER): Payer: Medicare HMO | Admitting: Nurse Practitioner

## 2021-09-01 VITALS — BP 130/62 | HR 65 | Temp 97.2°F | Resp 12 | Ht 60.0 in | Wt 131.0 lb

## 2021-09-01 DIAGNOSIS — Z09 Encounter for follow-up examination after completed treatment for conditions other than malignant neoplasm: Secondary | ICD-10-CM | POA: Diagnosis not present

## 2021-09-01 DIAGNOSIS — R443 Hallucinations, unspecified: Secondary | ICD-10-CM

## 2021-09-01 DIAGNOSIS — H6123 Impacted cerumen, bilateral: Secondary | ICD-10-CM

## 2021-09-01 NOTE — Patient Instructions (Signed)
Nice to see you today I am not making any medication changes at this point. I want you to see [name] in the next 2-3 weeks. Make the appointment on your way out today

## 2021-09-01 NOTE — Telephone Encounter (Signed)
Noted. Glad he is doing better!

## 2021-09-01 NOTE — Progress Notes (Signed)
Established Patient Office Visit  Subjective   Patient ID: [name], male    DOB: 1933-12-08  Age: 88 y.o. MRN: [id number]  Chief Complaint  Patient presents with   ER follow up    HPI  Hospital follow up: Patient was seen in the emergency department 12/07/2021 with diagnosis of fall, hallucinations, confusion and thrombocytopenia.  Patient was seen by primary care provider and placed on Remeron to help with sleep and appetite.  When evaluated in the emergency department he was taken off of the Remeron.  Patient is here for follow-up  States that he lost balance and fell between the cough and coffee table  States that he is no longer having hallucinations. States that he woke up and the furniture was rearranged to look like a doctors office. States that he noticed people in the hall way that was moving a disappearing staircase.  States that he tohought he was on an exam table. As the medication wore off he noticed that he was in his recliner. He does have a dog that is protective and did not bark when he was seeing these people  Ears popping:    Review of Systems  Constitutional:  Negative for chills.  HENT:  Positive for ear pain (popping. wears hearing aids).   Cardiovascular:  Negative for chest pain and leg swelling.  Neurological:  Negative for tremors.  Psychiatric/Behavioral:  Negative for hallucinations.       Objective:     BP 130/62   Pulse 65   Temp (!) 97.2 F (36.2 C)   Resp 12   Ht 5' (1.524 m)   Wt 131 lb (59.4 kg)   SpO2 97%   BMI 25.58 kg/m    Physical Exam Vitals and nursing note reviewed.  Constitutional:      Appearance: Normal appearance.  HENT:     Right Ear: Ear canal and external ear normal.     Left Ear: Ear canal and external ear normal.     Ears:     Comments: Wears hearing aids    Mouth/Throat:     Mouth: Mucous membranes are moist.     Pharynx: Oropharynx is clear.     Comments: Missing uvula and tonsils Eyes:      Extraocular Movements: Extraocular movements intact.     Comments: Pinpoint pupils in office. Wears corrective lenses  Cardiovascular:     Rate and Rhythm: Normal rate and regular rhythm.     Heart sounds: Normal heart sounds.  Pulmonary:     Effort: Pulmonary effort is normal.     Breath sounds: Normal breath sounds.  Neurological:     General: No focal deficit present.     Mental Status: He is alert.     Deep Tendon Reflexes:     Reflex Scores:      Bicep reflexes are 2+ on the right side and 2+ on the left side.      Patellar reflexes are 1+ on the right side and 1+ on the left side.    Comments: Bilateral upper and lower extremity strength 5/5  Bilateral knee replacements     40 minutes were spent reviewing documents and talking with patient. Over 50% was face to face  No results found for any visits on 09/01/21.    The ASCVD Risk score (Arnett DK, et al., 2019) failed to calculate for the following reasons:   The 2019 ASCVD risk score is only valid for ages 43 to 49  Assessment & Plan:   Problem List Items Addressed This Visit       Nervous and Auditory   Bilateral impacted cerumen    Verbal consent obtained unable to view TMs order to flush ears out.  Patient tolerated fine on right side left side patient had a sharp pain and started bleeding.  Staff to come and get me unable to fully visualize TM but blood in canal.  Pain has stopped prior to leaving the office.  Discontinued irrigation at that point.  He does have an appoint with audiology we will try to get scheduled up to also follow-up with primary care provider.  Gave precautions of not submerging head in case there is a TM rupture.      Relevant Orders   Ear Lavage     Other   Hallucinations    Patient was in the hospital having hallucinations.  They did discontinue his mirtazapine patient symptoms are resolved.  Patient currently now realizes that her hallucinations but did not differentiate that in  reality at the time when this was happening.      Hospital discharge follow-up - Primary    Did review emergency department note, CT scan of head and labs done notable for thrombocytopenia this is patient's baseline as he had low platelet counts in the past.  Not on anticoagulation currently.  No need to recheck labs at this current juncture patient to continue not taking mirtazapine.  No other medication changes at this juncture.       Return in about 2 weeks (around 09/15/2021) for F/u with Allie Bossier.    Romilda Garret, NP

## 2021-09-01 NOTE — Telephone Encounter (Signed)
Called number provided for [name]. Detailed message left on secured voicemail with approval and to call if any questions.  No further action needed at this time.

## 2021-09-01 NOTE — Assessment & Plan Note (Signed)
Verbal consent obtained unable to view TMs order to flush ears out.  Patient tolerated fine on right side left side patient had a sharp pain and started bleeding.  Staff to come and get me unable to fully visualize TM but blood in canal.  Pain has stopped prior to leaving the office.  Discontinued irrigation at that point.  He does have an appoint with audiology we will try to get scheduled up to also follow-up with primary care provider.  Gave precautions of not submerging head in case there is a TM rupture.

## 2021-09-01 NOTE — Telephone Encounter (Signed)
Approved.  

## 2021-09-01 NOTE — Assessment & Plan Note (Signed)
Patient was in the hospital having hallucinations.  They did discontinue his mirtazapine patient symptoms are resolved.  Patient currently now realizes that her hallucinations but did not differentiate that in reality at the time when this was happening.

## 2021-09-01 NOTE — Assessment & Plan Note (Signed)
Did review emergency department note, CT scan of head and labs done notable for thrombocytopenia this is patient's baseline as he had low platelet counts in the past.  Not on anticoagulation currently.  No need to recheck labs at this current juncture patient to continue not taking mirtazapine.  No other medication changes at this juncture.

## 2021-09-02 DIAGNOSIS — H6123 Impacted cerumen, bilateral: Secondary | ICD-10-CM | POA: Diagnosis not present

## 2021-09-02 DIAGNOSIS — H903 Sensorineural hearing loss, bilateral: Secondary | ICD-10-CM | POA: Diagnosis not present

## 2021-09-02 DIAGNOSIS — H9222 Otorrhagia, left ear: Secondary | ICD-10-CM | POA: Diagnosis not present

## 2021-09-04 ENCOUNTER — Encounter (HOSPITAL_COMMUNITY): Payer: Self-pay

## 2021-09-04 ENCOUNTER — Telehealth: Payer: Self-pay

## 2021-09-04 ENCOUNTER — Other Ambulatory Visit: Payer: Self-pay

## 2021-09-04 ENCOUNTER — Emergency Department (HOSPITAL_COMMUNITY): Payer: Medicare HMO

## 2021-09-04 ENCOUNTER — Observation Stay (HOSPITAL_COMMUNITY): Payer: Medicare HMO

## 2021-09-04 ENCOUNTER — Telehealth: Payer: Self-pay | Admitting: Primary Care

## 2021-09-04 ENCOUNTER — Inpatient Hospital Stay (HOSPITAL_COMMUNITY)
Admission: EM | Admit: 2021-09-04 | Discharge: 2021-09-17 | DRG: 917 | Disposition: A | Discharge status: Other Acute Inpatient Hospital | Payer: Medicare HMO | Attending: Internal Medicine | Admitting: Internal Medicine

## 2021-09-04 DIAGNOSIS — R0902 Hypoxemia: Secondary | ICD-10-CM | POA: Diagnosis not present

## 2021-09-04 DIAGNOSIS — T8383XA Hemorrhage of genitourinary prosthetic devices, implants and grafts, initial encounter: Secondary | ICD-10-CM | POA: Diagnosis present

## 2021-09-04 DIAGNOSIS — Z66 Do not resuscitate: Secondary | ICD-10-CM | POA: Diagnosis present

## 2021-09-04 DIAGNOSIS — N401 Enlarged prostate with lower urinary tract symptoms: Secondary | ICD-10-CM | POA: Diagnosis present

## 2021-09-04 DIAGNOSIS — R000 Tachycardia, unspecified: Secondary | ICD-10-CM | POA: Diagnosis not present

## 2021-09-04 DIAGNOSIS — R4587 Impulsiveness: Secondary | ICD-10-CM | POA: Diagnosis present

## 2021-09-04 DIAGNOSIS — F322 Major depressive disorder, single episode, severe without psychotic features: Secondary | ICD-10-CM | POA: Diagnosis not present

## 2021-09-04 DIAGNOSIS — N179 Acute kidney failure, unspecified: Secondary | ICD-10-CM | POA: Diagnosis present

## 2021-09-04 DIAGNOSIS — R310 Gross hematuria: Secondary | ICD-10-CM | POA: Diagnosis present

## 2021-09-04 DIAGNOSIS — R3912 Poor urinary stream: Secondary | ICD-10-CM | POA: Diagnosis not present

## 2021-09-04 DIAGNOSIS — T402X2A Poisoning by other opioids, intentional self-harm, initial encounter: Principal | ICD-10-CM | POA: Diagnosis present

## 2021-09-04 DIAGNOSIS — Z880 Allergy status to penicillin: Secondary | ICD-10-CM

## 2021-09-04 DIAGNOSIS — I2510 Atherosclerotic heart disease of native coronary artery without angina pectoris: Secondary | ICD-10-CM | POA: Diagnosis present

## 2021-09-04 DIAGNOSIS — R4182 Altered mental status, unspecified: Secondary | ICD-10-CM | POA: Diagnosis not present

## 2021-09-04 DIAGNOSIS — D510 Vitamin B12 deficiency anemia due to intrinsic factor deficiency: Secondary | ICD-10-CM | POA: Diagnosis present

## 2021-09-04 DIAGNOSIS — H6092 Unspecified otitis externa, left ear: Secondary | ICD-10-CM | POA: Diagnosis present

## 2021-09-04 DIAGNOSIS — Z8616 Personal history of COVID-19: Secondary | ICD-10-CM

## 2021-09-04 DIAGNOSIS — Z8249 Family history of ischemic heart disease and other diseases of the circulatory system: Secondary | ICD-10-CM

## 2021-09-04 DIAGNOSIS — Z9842 Cataract extraction status, left eye: Secondary | ICD-10-CM

## 2021-09-04 DIAGNOSIS — Z91048 Other nonmedicinal substance allergy status: Secondary | ICD-10-CM

## 2021-09-04 DIAGNOSIS — F03918 Unspecified dementia, unspecified severity, with other behavioral disturbance: Secondary | ICD-10-CM | POA: Diagnosis not present

## 2021-09-04 DIAGNOSIS — N400 Enlarged prostate without lower urinary tract symptoms: Secondary | ICD-10-CM | POA: Diagnosis present

## 2021-09-04 DIAGNOSIS — E876 Hypokalemia: Secondary | ICD-10-CM | POA: Diagnosis present

## 2021-09-04 DIAGNOSIS — G928 Other toxic encephalopathy: Secondary | ICD-10-CM | POA: Diagnosis present

## 2021-09-04 DIAGNOSIS — I4891 Unspecified atrial fibrillation: Secondary | ICD-10-CM | POA: Diagnosis present

## 2021-09-04 DIAGNOSIS — Y658 Other specified misadventures during surgical and medical care: Secondary | ICD-10-CM | POA: Diagnosis present

## 2021-09-04 DIAGNOSIS — D696 Thrombocytopenia, unspecified: Secondary | ICD-10-CM | POA: Diagnosis not present

## 2021-09-04 DIAGNOSIS — N138 Other obstructive and reflux uropathy: Secondary | ICD-10-CM | POA: Diagnosis present

## 2021-09-04 DIAGNOSIS — F411 Generalized anxiety disorder: Secondary | ICD-10-CM | POA: Diagnosis not present

## 2021-09-04 DIAGNOSIS — F0394 Unspecified dementia, unspecified severity, with anxiety: Secondary | ICD-10-CM | POA: Diagnosis present

## 2021-09-04 DIAGNOSIS — E872 Acidosis, unspecified: Secondary | ICD-10-CM | POA: Diagnosis not present

## 2021-09-04 DIAGNOSIS — Z20822 Contact with and (suspected) exposure to covid-19: Secondary | ICD-10-CM | POA: Diagnosis not present

## 2021-09-04 DIAGNOSIS — G9341 Metabolic encephalopathy: Secondary | ICD-10-CM | POA: Diagnosis not present

## 2021-09-04 DIAGNOSIS — R531 Weakness: Secondary | ICD-10-CM | POA: Diagnosis not present

## 2021-09-04 DIAGNOSIS — M25519 Pain in unspecified shoulder: Secondary | ICD-10-CM | POA: Diagnosis not present

## 2021-09-04 DIAGNOSIS — F332 Major depressive disorder, recurrent severe without psychotic features: Secondary | ICD-10-CM | POA: Diagnosis not present

## 2021-09-04 DIAGNOSIS — R338 Other retention of urine: Secondary | ICD-10-CM | POA: Diagnosis not present

## 2021-09-04 DIAGNOSIS — E785 Hyperlipidemia, unspecified: Secondary | ICD-10-CM | POA: Diagnosis not present

## 2021-09-04 DIAGNOSIS — R404 Transient alteration of awareness: Secondary | ICD-10-CM | POA: Diagnosis not present

## 2021-09-04 DIAGNOSIS — J690 Pneumonitis due to inhalation of food and vomit: Secondary | ICD-10-CM | POA: Diagnosis present

## 2021-09-04 DIAGNOSIS — E860 Dehydration: Secondary | ICD-10-CM | POA: Diagnosis present

## 2021-09-04 DIAGNOSIS — F0393 Unspecified dementia, unspecified severity, with mood disturbance: Secondary | ICD-10-CM | POA: Diagnosis present

## 2021-09-04 DIAGNOSIS — R059 Cough, unspecified: Secondary | ICD-10-CM | POA: Diagnosis not present

## 2021-09-04 DIAGNOSIS — G4733 Obstructive sleep apnea (adult) (pediatric): Secondary | ICD-10-CM | POA: Diagnosis present

## 2021-09-04 DIAGNOSIS — I10 Essential (primary) hypertension: Secondary | ICD-10-CM | POA: Diagnosis not present

## 2021-09-04 DIAGNOSIS — T50902A Poisoning by unspecified drugs, medicaments and biological substances, intentional self-harm, initial encounter: Secondary | ICD-10-CM | POA: Diagnosis present

## 2021-09-04 DIAGNOSIS — D72821 Monocytosis (symptomatic): Secondary | ICD-10-CM | POA: Diagnosis not present

## 2021-09-04 DIAGNOSIS — Z961 Presence of intraocular lens: Secondary | ICD-10-CM | POA: Diagnosis present

## 2021-09-04 DIAGNOSIS — G4700 Insomnia, unspecified: Secondary | ICD-10-CM | POA: Diagnosis present

## 2021-09-04 DIAGNOSIS — E539 Vitamin B deficiency, unspecified: Secondary | ICD-10-CM | POA: Diagnosis present

## 2021-09-04 DIAGNOSIS — N3281 Overactive bladder: Secondary | ICD-10-CM | POA: Diagnosis present

## 2021-09-04 DIAGNOSIS — Z79899 Other long term (current) drug therapy: Secondary | ICD-10-CM

## 2021-09-04 DIAGNOSIS — Z96653 Presence of artificial knee joint, bilateral: Secondary | ICD-10-CM | POA: Diagnosis present

## 2021-09-04 DIAGNOSIS — Z96641 Presence of right artificial hip joint: Secondary | ICD-10-CM | POA: Diagnosis present

## 2021-09-04 DIAGNOSIS — Z87891 Personal history of nicotine dependence: Secondary | ICD-10-CM

## 2021-09-04 DIAGNOSIS — Z9841 Cataract extraction status, right eye: Secondary | ICD-10-CM

## 2021-09-04 LAB — CBC WITH DIFFERENTIAL/PLATELET
Abs Immature Granulocytes: 0.39 10*3/uL — ABNORMAL HIGH (ref 0.00–0.07)
Basophils Absolute: 0.1 10*3/uL (ref 0.0–0.1)
Basophils Relative: 1 %
Eosinophils Absolute: 0 10*3/uL (ref 0.0–0.5)
Eosinophils Relative: 0 %
HCT: 47.6 % (ref 39.0–52.0)
Hemoglobin: 14.8 g/dL (ref 13.0–17.0)
Immature Granulocytes: 3 %
Lymphocytes Relative: 10 %
Lymphs Abs: 1.5 10*3/uL (ref 0.7–4.0)
MCH: 27.1 pg (ref 26.0–34.0)
MCHC: 31.1 g/dL (ref 30.0–36.0)
MCV: 87.2 fL (ref 80.0–100.0)
Monocytes Absolute: 2.8 10*3/uL — ABNORMAL HIGH (ref 0.1–1.0)
Monocytes Relative: 19 %
Neutro Abs: 9.7 10*3/uL — ABNORMAL HIGH (ref 1.7–7.7)
Neutrophils Relative %: 67 %
Platelets: 56 10*3/uL — ABNORMAL LOW (ref 150–400)
RBC: 5.46 MIL/uL (ref 4.22–5.81)
RDW: 15.9 % — ABNORMAL HIGH (ref 11.5–15.5)
WBC: 14.5 10*3/uL — ABNORMAL HIGH (ref 4.0–10.5)
nRBC: 0 % (ref 0.0–0.2)

## 2021-09-04 LAB — URINALYSIS, ROUTINE W REFLEX MICROSCOPIC

## 2021-09-04 LAB — RAPID URINE DRUG SCREEN, HOSP PERFORMED
Amphetamines: NOT DETECTED
Barbiturates: NOT DETECTED
Benzodiazepines: NOT DETECTED
Cocaine: NOT DETECTED
Opiates: POSITIVE — AB
Tetrahydrocannabinol: NOT DETECTED

## 2021-09-04 LAB — COMPREHENSIVE METABOLIC PANEL
ALT: 13 U/L (ref 0–44)
AST: 15 U/L (ref 15–41)
Albumin: 4.1 g/dL (ref 3.5–5.0)
Alkaline Phosphatase: 76 U/L (ref 38–126)
Anion gap: 6 (ref 5–15)
BUN: 11 mg/dL (ref 8–23)
CO2: 25 mmol/L (ref 22–32)
Calcium: 8.4 mg/dL — ABNORMAL LOW (ref 8.9–10.3)
Chloride: 109 mmol/L (ref 98–111)
Creatinine, Ser: 0.99 mg/dL (ref 0.61–1.24)
GFR, Estimated: >60 mL/min (ref >60)
Glucose, Bld: 132 mg/dL — ABNORMAL HIGH (ref 70–99)
Potassium: 4 mmol/L (ref 3.5–5.1)
Sodium: 140 mmol/L (ref 135–145)
Total Bilirubin: 0.8 mg/dL (ref 0.3–1.2)
Total Protein: 5.9 g/dL — ABNORMAL LOW (ref 6.5–8.1)

## 2021-09-04 LAB — CBG MONITORING, ED
Glucose-Capillary: 122 mg/dL — ABNORMAL HIGH (ref 70–99)
Glucose-Capillary: 125 mg/dL — ABNORMAL HIGH (ref 70–99)

## 2021-09-04 LAB — RESP PANEL BY RT-PCR (FLU A&B, COVID) ARPGX2
Influenza A by PCR: NEGATIVE
Influenza B by PCR: NEGATIVE
SARS Coronavirus 2 by RT PCR: NEGATIVE

## 2021-09-04 LAB — URINALYSIS, MICROSCOPIC (REFLEX): Squamous Epithelial / HPF: NONE SEEN (ref 0–5)

## 2021-09-04 LAB — LACTIC ACID, PLASMA
Lactic Acid, Venous: 2.1 mmol/L (ref 0.5–1.9)
Lactic Acid, Venous: 1.4 mmol/L (ref 0.5–1.9)

## 2021-09-04 LAB — BLOOD GAS, VENOUS
Acid-Base Excess: 1 mmol/L (ref 0.0–2.0)
Bicarbonate: 29.8 mmol/L — ABNORMAL HIGH (ref 20.0–28.0)
O2 Saturation: 86.8 %
Patient temperature: 37
pCO2, Ven: 65 mmHg — ABNORMAL HIGH (ref 44–60)
pH, Ven: 7.27 (ref 7.25–7.43)
pO2, Ven: 55 mmHg — ABNORMAL HIGH (ref 32–45)

## 2021-09-04 LAB — TROPONIN I (HIGH SENSITIVITY)
Troponin I (High Sensitivity): 4 ng/L (ref <18)
Troponin I (High Sensitivity): 4 ng/L (ref <18)

## 2021-09-04 LAB — BRAIN NATRIURETIC PEPTIDE: B Natriuretic Peptide: 146.8 pg/mL — ABNORMAL HIGH (ref 0.0–100.0)

## 2021-09-04 MED ORDER — NALOXONE HCL 0.4 MG/ML IJ SOLN: 0.4000 mg | INTRAMUSCULAR | Status: DC | PRN

## 2021-09-04 MED ORDER — ALBUTEROL SULFATE (2.5 MG/3ML) 0.083% IN NEBU: 2.5000 mg | INHALATION_SOLUTION | RESPIRATORY_TRACT | Status: DC | PRN

## 2021-09-04 MED ORDER — SERTRALINE HCL 50 MG PO TABS
50.0000 mg | ORAL_TABLET | Freq: Every day | ORAL | Status: DC
Start: 2021-09-05 — End: 2021-09-06
  Administered 2021-09-05: 50 mg via ORAL
  Filled 2021-09-04: qty 1

## 2021-09-04 MED ORDER — SODIUM CHLORIDE 0.9 % IV SOLN: INTRAVENOUS | Status: DC

## 2021-09-04 MED ORDER — TAMSULOSIN HCL 0.4 MG PO CAPS
0.8000 mg | ORAL_CAPSULE | Freq: Every day | ORAL | Status: DC
  Administered 2021-09-05 – 2021-09-17 (×13): 0.8 mg via ORAL
  Filled 2021-09-04 (×13): qty 2

## 2021-09-04 MED ORDER — NALOXONE HCL 0.4 MG/ML IJ SOLN
0.4000 mg | Freq: Once | INTRAMUSCULAR | Status: AC
Start: 2021-09-04 — End: 2021-09-04
  Administered 2021-09-04: 0.4 mg via INTRAVENOUS
  Filled 2021-09-04: qty 1

## 2021-09-04 MED ORDER — POLYETHYLENE GLYCOL 3350 17 G PO PACK: 17.0000 g | PACK | Freq: Every day | ORAL | Status: DC | PRN

## 2021-09-04 MED ORDER — ONDANSETRON HCL 4 MG PO TABS: 4.0000 mg | ORAL_TABLET | Freq: Four times a day (QID) | ORAL | Status: DC | PRN

## 2021-09-04 MED ORDER — SODIUM CHLORIDE 0.9 % IV BOLUS
500.0000 mL | Freq: Once | INTRAVENOUS | Status: AC
Start: 2021-09-04 — End: 2021-09-04
  Administered 2021-09-04: 500 mL via INTRAVENOUS

## 2021-09-04 MED ORDER — CIPROFLOXACIN-DEXAMETHASONE 0.3-0.1 % OT SUSP
3.0000 [drp] | Freq: Three times a day (TID) | OTIC | Status: AC
  Administered 2021-09-05 – 2021-09-12 (×23): 3 [drp] via OTIC
  Filled 2021-09-04: qty 7.5

## 2021-09-04 MED ORDER — ACETAMINOPHEN 650 MG RE SUPP
650.0000 mg | Freq: Four times a day (QID) | RECTAL | Status: DC | PRN
  Administered 2021-09-04: 650 mg via RECTAL
  Filled 2021-09-04: qty 1

## 2021-09-04 MED ORDER — ONDANSETRON HCL 4 MG/2ML IJ SOLN
4.0000 mg | Freq: Four times a day (QID) | INTRAMUSCULAR | Status: DC | PRN
  Administered 2021-09-04 – 2021-09-06 (×2): 4 mg via INTRAVENOUS
  Filled 2021-09-04 (×2): qty 2

## 2021-09-04 MED ORDER — ACETAMINOPHEN 325 MG PO TABS
650.0000 mg | ORAL_TABLET | Freq: Four times a day (QID) | ORAL | Status: DC | PRN
  Administered 2021-09-12 – 2021-09-17 (×3): 650 mg via ORAL
  Filled 2021-09-04 (×4): qty 2

## 2021-09-04 NOTE — Telephone Encounter (Signed)
Per chart review tab pt is at [facility] ED. Sending note to Gentry Fitz NP and [facility] CMA.

## 2021-09-04 NOTE — H&P (Addendum)
History and Physical    Patient: [name] MRN:[id number] DOB: 12/29/1933 DOA: 09/04/2021 DOS: the patient was seen and examined on 09/04/2021 PCP: Clark, Katherine K, NP  Patient coming from: Home via EMS  Chief Complaint: Confusion/altered mental status Chief Complaint  Patient presents with   Altered Mental Status   HPI: [name] is a 87 y.o. male with medical history significant of HTN, HLD, CAD, history of choledocholithiasis, history of laparoscopic cholecystectomy, BPH, overactive bladder, OSA not on CPAP, cognitive impairment, anxiety/depression who presented to [facility] ED on 8/18 via EMS after patient was found with increased lethargy and confusion today by daughter.  Patient unable to answer questions and history obtained from both daughters present at bedside.  Patient currently lives alone but daughters place his daily medications out for him so there is no confusion.  Patient with recent hospitalizations for similar events recently, also reported previous hallucinations thought to be due to previous Remeron and trazodone use which has now been discontinued for roughly 1 week.  Daughter was also reported that patient did have access to a cough medicine that he may have taken.  They are unaware of any other sedating or narcotics available for him at home.  Only other new medication for patient is Ciprodex eardrops after having trauma to his left ear.  Addendum: Received report from daughters that patient has had increased suicidal thoughts including stating "I am going to blow my brains out with a gun".  Patient's daughter also found empty Dilaudid container at home; which was apparently his wife's previous medication in which she has now passed 2 years ago from pancreatic cancer.  Concern for intentional overdose.  In the ED, temperature 97.6 F, HR 100, RR 12, BP 120/65, 92% on room air.  WBC 14.5, hemoglobin 14.8, platelets 56.  Sodium 140, potassium 4.0, chloride 109, CO2  25, BUN 11, creatinine 0.99.  Glucose 132.  LFTs/total bilirubin within normal limits.  Lactic acid 2.1.  BNP 196.8.  COVID-19 PCR negative.  Influenza A/B PCR negative.  CT head without contrast with no acute intracranial abnormality, chronic small vessel ischemic disease and cerebral atrophy.  Chest x-ray with no acute cardiopulmonary disease process.  EDP consulted [facility] for admission for further evaluation and work-up of acute metabolic encephalopathy.   Review of Systems: unable to review all systems due to the inability of the patient to answer questions. Past Medical History:  Diagnosis Date   Arthralgia of multiple joints    limited mobility w/  independant adl's   BPH with obstruction/lower urinary tract symptoms    Chronic idiopathic monocytosis    followed by dr grandfortuna--  per lov note 06/ 2017 persistant unexplained  with normal bone barrow bx   Coronary atherosclerosis of unspecified type of vessel, native or graft    cardiologist-  dr crenshaw -- per lov note 11-19-2014 ,  cardiac cath in 2000-- pLAD 60-70%,  small intermediate branch 70-80%,  mRCA 20%,  normal LV   Degenerative arthritis of spine    cervical and lumar   Diverticulosis of colon (without mention of hemorrhage)    Dysuria    chronic   Elevated PSA    Feeling of incomplete bladder emptying    Hiatal hernia    moderate per ct 11/ 2017   History of adenomatous polyp of colon    2008   History of atrial fibrillation    remote hx episode   History of COVID-19 03/05/2020   History of pancreatitis      07-01-2013  and 10-10-2015   History of prostatitis    2014   Hx of colonic polyps    Hyperlipidemia    Hypertension    Mouth sore    roof of mouth sore    Nephrolithiasis    Nocturia more than twice per night    severe  w/ leakage   OSA (obstructive sleep apnea)    INTOLERANT CPAP   Osteoarthritis    Pernicious anemia    B12  Def.   Peyronie disease    Renal insufficiency    Sepsis (HCC) 03/22/2017    Thrombocytopenia, unspecified (HCC) hemotology/oncologist-  dr grandfortuna   per dr grandfortuna lov note 06/ 2016  secondary to vitro clumping   Vitamin B deficiency    Past Surgical History:  Procedure Laterality Date   BONE MARROW BIOPSY  2011   normal   CARDIAC CATHETERIZATION  2000   per dr crenshaw lov note --  dLAD 60-70%,  small intermediate branch 70-80%,  mRCA 20%,  normal LV   CARDIOVASCULAR STRESS TEST  01-29-2013   dr crenshaw   normal nuclear study w/ no ischemia,  normal LV function and wall motion , ef 82%   CATARACT EXTRACTION W/ INTRAOCULAR LENS  IMPLANT, BILATERAL  08/2015   CHOLECYSTECTOMY  11/24/2010   Procedure: LAPAROSCOPIC CHOLECYSTECTOMY WITH INTRAOPERATIVE CHOLANGIOGRAM;  Surgeon: Todd M Gerkin, MD;  Location: [facility];  Service: General;  Laterality: N/A;  c-arm   CYSTOSCOPY W/ URETERAL STENT PLACEMENT Left 12/17/2015   Procedure: CYSTOSCOPY WITH RETROGRADE PYELOGRAM/URETERAL STENT PLACEMENT;  Surgeon: Benjamin W Herrick, MD;  Location: [facility];  Service: Urology;  Laterality: Left;   CYSTOSCOPY WITH RETROGRADE PYELOGRAM, URETEROSCOPY AND STENT PLACEMENT Left 12/22/2015   Procedure: CYSTOSCOPY WITH LEFT RETROGRADE  URETEROSCOPY AND STENT PLACEMENT;  Surgeon: Sigmund Tannenbaum, MD;  Location: [facility];  Service: Urology;  Laterality: Left;   ERCP N/A 07/04/2013   Procedure: ENDOSCOPIC RETROGRADE CHOLANGIOPANCREATOGRAPHY (ERCP);  Surgeon: Carl E Gessner, MD;  Location: [facility];  Service: Endoscopy;  Laterality: N/A;  MAC if available   ERCP N/A 10/12/2015   Procedure: ENDOSCOPIC RETROGRADE CHOLANGIOPANCREATOGRAPHY (ERCP);  Surgeon: Daniel P Jacobs, MD;  Location: [facility];  Service: Endoscopy;  Laterality: N/A;   HOLMIUM LASER APPLICATION Left 12/22/2015   Procedure: HOLMIUM LASER APPLICATION;  Surgeon: Sigmund Tannenbaum, MD;  Location: [facility];  Service: Urology;  Laterality: Left;   INGUINAL HERNIA REPAIR Right 01/25/2002    KNEE ARTHROSCOPY  x5   SATURATION BIOPSY OF PROSTATE  05-29-2007  and 01-26-2008   SHOULDER ARTHROSCOPY WITH OPEN ROTATOR CUFF REPAIR AND DISTAL CLAVICLE ACROMINECTOMY Right 11/19/2004   TOTAL HIP ARTHROPLASTY Right 05/21/2009   TOTAL KNEE ARTHROPLASTY Bilateral left 05-02-2003/  right  03-23-2010   TRANSTHORACIC ECHOCARDIOGRAM  12/23/2004   ef 60%, mild MV calcification without stenosis/  mild TR,  PASP 36mmHg   UVULOPALATOPHARYNGOPLASTY  1993    w/  T & A   Social History:  reports that he quit smoking about 55 years ago. His smoking use included pipe. He has never used smokeless tobacco. He reports that he does not drink alcohol and does not use drugs.  Allergies  Allergen Reactions   Penicillins Hives and Other (See Comments)    Whelps, passed out Tolerates cephalosporins  Has patient had a PCN reaction causing immediate rash, facial/tongue/throat swelling, SOB or lightheadedness with hypotension:  yes Has patient had a PCN reaction causing severe rash involving mucus membranes or skin necrosis: no   Has patient had a PCN reaction that required hospitalization: no Has patient had a PCN reaction occurring within the last 10 years: no If all of the above answers are "NO", then may proceed with Cephalosporin use.    Tape Hives and Other (See Comments)    Paper Tape    Family History  Problem Relation Age of Onset   Peripheral vascular disease Mother    Colon cancer Father 65   Breast cancer Daughter    Breast cancer Daughter     Prior to Admission medications   Medication Sig Start Date End Date Taking? Authorizing Provider  acetaminophen (TYLENOL) 500 MG tablet Take 1,000 mg by mouth in the morning and at bedtime.   Yes [provider]  Artificial Tear Solution (TEARS NATURALE OP) Place 1-2 drops into both eyes at bedtime as needed (dry eyes).   Yes [provider]  ciprofloxacin-dexamethasone (CIPRODEX) OTIC suspension Place 3 drops into the left ear in the  morning, at noon, and at bedtime. 09/02/21 09/12/21 Yes [provider]  diclofenac Sodium (VOLTAREN ARTHRITIS PAIN) 1 % GEL Apply 2 g topically 3 (three) times daily as needed. Patient taking differently: Apply 2 g topically 3 (three) times daily as needed (left shoulder and arm pain). 08/18/21  Yes Pleas Koch, NP  Ensure (ENSURE) Take 237 mLs by mouth daily. Chocolate   Yes [provider]  fluticasone (FLONASE) 50 MCG/ACT nasal spray Place 1 spray into both nostrils 2 (two) times daily. 08/18/21  Yes Pleas Koch, NP  GEMTESA 75 MG TABS Take 1 tablet by mouth daily. 07/31/21  Yes [provider]  loratadine (CLARITIN) 10 MG tablet Take 1 tablet (10 mg total) by mouth daily. For allergies 08/18/21  Yes Pleas Koch, NP  sertraline (ZOLOFT) 50 MG tablet Take 1 tablet (50 mg total) by mouth daily. For anxiety and depression Patient taking differently: Take 50 mg by mouth daily. 07/22/21  Yes Pleas Koch, NP  tamsulosin (FLOMAX) 0.4 MG CAPS capsule Take 2 capsules (0.8 mg total) by mouth daily. For urine flow Patient taking differently: Take 0.8 mg by mouth daily. 07/15/21  Yes Pleas Koch, NP    Physical Exam: Vitals:   09/04/21 1506 09/04/21 1530 09/04/21 1545 09/04/21 1600  BP: 137/66 118/62 130/69 128/65  Pulse: 94 100 98 100  Resp: _0 Temp: 97.6 F (36.4 C)     TempSrc: Oral     SpO2: 95% 93% 90% 92%   GEN: 88 yo male, somnolent but will awaken to voice but nonverbal with mumbling speech, elderly in appearance HEENT: NCAT, pupils pinpoint 1 mm equal with minimal reactive, EOMI, sclera clear, dry mucous membranes PULM: CTAB w/o wheezes/crackles, normal respiratory effort, on 2 L nasal cannula CV: Tachycardic, regular rhythm w/o M/G/R GI: abd soft, NTND, NABS, no R/G/M MSK: no peripheral edema NEURO: Unable to fully assess due to current mental status PSYCH: Unable to fully assess due to current mental status Integumentary:  No concerning rashes/lesions/wounds on exposed skin    Assessment and Plan:  Acute metabolic encephalopathy concerning for intentional opioid overdose Concern for suicidal attempt Patient presenting to the ED with recurrent confusion, lethargy and altered mental status.  Patient has been seen by his PCP and in the ED over the last few weeks with similar events.  Patient was recently stopped on Remeron and trazodone as likely contributing factors to his previous hallucinations/somnolence.  On arrival, patient was afebrile but with  mild leukocytosis.  Chest x-ray unrevealing.  CT head without contrast negative for acute intracranial abnormality.  Given patient's pinpoint pupils, concern for narcotic induced somnolence. -- Observation; telemetry -- Narcan x1 now and as needed -- MR brain without contrast -- Check EEG -- Check TSH -- Given difficulty of in and out catheterization in the ED, place coud Foley catheter to obtain urinalysis/urine culture to rule out UTI -- Blood cultures x2: Pending -- IVF hydration with NS at 56m/h -- N.p.o. until mental status improves -- Hold antibiotics at this time until urine analysis/culture obtained -- Supportive care, avoid sedating medications  Addendum: RN received report from daughters that they found an empty Dilaudid container that was not patient's prescription in the garbage can at home.  Patient also has been apparently having suicidal thoughts at home.  Concerned that this was not intentional overdose. -- Initiate suicide precautions -- OSurveyor, quantity-- Psychiatry consultation  Lactic acidosis Lactic acid 2.1 on admission, likely secondary to dehydration in the setting of encephalopathy.  No focal infectious etiology found such far. -- IV fluid hydration -- Repeat lactic acid in a.m.  Leukocytosis WBC count 14.5 on admission, suspect hemoconcentration in the setting of dehydration.  No focal infectious etiology found. -- Blood  cultures x2: Pending -- Awaiting for urinalysis/urine culture to be obtained -- Hold antibiotics for now -- IV fluid hydration -- Repeat CBC in a.m.  Left otitis externa --Continue Ciprodex eardrops 3 times daily  BPH: Continue tamsulosin 0.4 mg p.o. daily if able to tolerate oral intake  Anxiety/depression: Continue sertraline if able to tolerate oral intake  CAD/HTN/HLD: Currently not on medication outpatient     Advance Care Planning: DNR  Consults: None  Family Communication: Updated patient's daughters present at bedside  Severity of Illness: The appropriate patient status for this patient is OBSERVATION. Observation status is judged to be reasonable and necessary in order to provide the required intensity of service to ensure the patient's safety. The patient's presenting symptoms, physical exam findings, and initial radiographic and laboratory data in the context of their medical condition is felt to place them at decreased risk for further clinical deterioration. Furthermore, it is anticipated that the patient will be medically stable for discharge from the hospital within 2 midnights of admission.   Author: EDonnamarie PoagABritish Indian Ocean [place] DO 09/04/2021 5:09 PM  For on call review [email or web address]

## 2021-09-04 NOTE — Telephone Encounter (Signed)
Patient daughter [name] called in stating that [name] has not been acting like himself. Stated that he hasn't been waking up much, his eyes have been rolling in the back of his head, there has been long times in between breaths, and when his eyes are open he just have a blank stare. Sent over to triage.

## 2021-09-04 NOTE — Telephone Encounter (Signed)
I spoke with [place] Education officer, museum with [facility] and she was not able to speak with pts daughter and said [name] clinical mgr with [facility] was going to try to reach pts daughter; [name] said she has not had opportunity to try pts daughter this morning and request cb from provider after determines what needs to be done. I spoke with [name] (DPR signed) and she said that sharons sister was there when social worker was there and the information in the access nurse note was not accurate. [name] said that pt took gemtesa and tylenol yesterday morning because he did not take it on 09/02/21.  Pt took regular medicine for morning minus the Tylenol in morning med yesterday morning. [name] was only med pt took yesterday morning that was not a scheduled morning med because forgot to take it the night before. [name] said the pt is doing fine today and is back on regular schedule for taking his meds. Sending note to Gentry Fitz NP and [facility] CMA.

## 2021-09-04 NOTE — Telephone Encounter (Signed)
Noted. Glad patient is doing better.

## 2021-09-04 NOTE — ED Provider Notes (Signed)
[facility] DEPT Provider Note   CSN: [id number] Arrival date & time: 09/04/21  1241     History  Chief Complaint  Patient presents with   Altered Mental Status    [name] is a 88 y.o. male.  88 year old male with prior medical history as detailed below presents for evaluation.  Patient arrives with his daughters who provide majority of history.  Patient with significant somnolence and lethargy this this morning.  Patient is normally interactive and somewhat agitated at times.  Patient with history of dementia.  Patient with recent increased levels of agitation requiring adjustment of medications.  Recent medication adjustments include changes to Remeron, trazodone dosing.  Patient apparently lives at home.  His 2 daughters to check on him frequently.  Per the daughter's report he has directly supervised medication administration.  The daughters do not think it is likely that the patient was able to take the medication without their knowledge.    The history is provided by the patient and medical records.  Altered Mental Status Presenting symptoms: behavior changes and lethargy   Severity:  Mild Most recent episode:  Today      Home Medications Prior to Admission medications   Medication Sig Start Date End Date Taking? Authorizing Provider  acetaminophen (TYLENOL) 500 MG tablet Take 1,000 mg by mouth in the morning and at bedtime.   Yes [provider]  Artificial Tear Solution (TEARS NATURALE OP) Place 1-2 drops into both eyes at bedtime as needed (dry eyes).   Yes [provider]  ciprofloxacin-dexamethasone (CIPRODEX) OTIC suspension Place 3 drops into the left ear in the morning, at noon, and at bedtime. 09/02/21 09/12/21 Yes [provider]  diclofenac Sodium (VOLTAREN ARTHRITIS PAIN) 1 % GEL Apply 2 g topically 3 (three) times daily as needed. Patient taking differently: Apply 2 g topically 3 (three) times  daily as needed (left shoulder and arm pain). 08/18/21  Yes Pleas Koch, NP  Ensure (ENSURE) Take 237 mLs by mouth daily. Chocolate   Yes [provider]  fluticasone (FLONASE) 50 MCG/ACT nasal spray Place 1 spray into both nostrils 2 (two) times daily. 08/18/21  Yes Pleas Koch, NP  GEMTESA 75 MG TABS Take 1 tablet by mouth daily. 07/31/21  Yes [provider]  loratadine (CLARITIN) 10 MG tablet Take 1 tablet (10 mg total) by mouth daily. For allergies 08/18/21  Yes Pleas Koch, NP  sertraline (ZOLOFT) 50 MG tablet Take 1 tablet (50 mg total) by mouth daily. For anxiety and depression Patient taking differently: Take 50 mg by mouth daily. 07/22/21  Yes Pleas Koch, NP  tamsulosin (FLOMAX) 0.4 MG CAPS capsule Take 2 capsules (0.8 mg total) by mouth daily. For urine flow Patient taking differently: Take 0.8 mg by mouth daily. 07/15/21  Yes Pleas Koch, NP      Allergies    Penicillins and Tape    Review of Systems   Review of Systems  All other systems reviewed and are negative.   Physical Exam Updated Vital Signs BP 128/65   Pulse 100   Temp 97.6 F (36.4 C) (Oral)   Resp 12   SpO2 92%  Physical Exam Vitals and nursing note reviewed.  Constitutional:      General: He is not in acute distress.    Appearance: He is well-developed.     Comments: Lethargic, awakens with verbal stimulation, is able to tell me his name.  Patient is slow  to attempt to answer questions.  HENT:     Head: Normocephalic and atraumatic.  Eyes:     Conjunctiva/sclera: Conjunctivae normal.     Pupils: Pupils are equal, round, and reactive to light.  Cardiovascular:     Rate and Rhythm: Normal rate and regular rhythm.     Heart sounds: Normal heart sounds.  Pulmonary:     Effort: Pulmonary effort is normal. No respiratory distress.     Breath sounds: Normal breath sounds.  Abdominal:     General: There is no distension.     Palpations: Abdomen is soft.      Tenderness: There is no abdominal tenderness.  Musculoskeletal:        General: No deformity. Normal range of motion.     Cervical back: Normal range of motion and neck supple.  Skin:    General: Skin is warm and dry.  Neurological:     General: No focal deficit present.     Comments: Lethargic, slow to respond, oriented to person only     ED Results / Procedures / Treatments   Labs (all labs ordered are listed, but only abnormal results are displayed) Labs Reviewed  CBC WITH DIFFERENTIAL/PLATELET - Abnormal; Notable for the following components:      Result Value   WBC 14.5 (*)    RDW 15.9 (*)    Platelets 56 (*)    Neutro Abs 9.7 (*)    Monocytes Absolute 2.8 (*)    Abs Immature Granulocytes 0.39 (*)    All other components within normal limits  COMPREHENSIVE METABOLIC PANEL - Abnormal; Notable for the following components:   Glucose, Bld 132 (*)    Calcium 8.4 (*)    Total Protein 5.9 (*)    All other components within normal limits  BRAIN NATRIURETIC PEPTIDE - Abnormal; Notable for the following components:   B Natriuretic Peptide 146.8 (*)    All other components within normal limits  LACTIC ACID, PLASMA - Abnormal; Notable for the following components:   Lactic Acid, Venous 2.1 (*)    All other components within normal limits  BLOOD GAS, VENOUS - Abnormal; Notable for the following components:   pCO2, Ven 65 (*)    pO2, Ven 55 (*)    Bicarbonate 29.8 (*)    All other components within normal limits  CBG MONITORING, ED - Abnormal; Notable for the following components:   Glucose-Capillary 122 (*)    All other components within normal limits  RESP PANEL BY RT-PCR (FLU A&B, COVID) ARPGX2  CULTURE, BLOOD (ROUTINE X 2)  CULTURE, BLOOD (ROUTINE X 2)  LACTIC ACID, PLASMA  URINALYSIS, ROUTINE W REFLEX MICROSCOPIC  TROPONIN I (HIGH SENSITIVITY)  TROPONIN I (HIGH SENSITIVITY)    EKG EKG Interpretation  Date/Time:  Friday September 04 2021 12:58:30 EDT Ventricular  Rate:  97 PR Interval:  190 QRS Duration: 122 QT Interval:  336 QTC Calculation: 427 R Axis:   -77 Text Interpretation: Sinus tachycardia Atrial premature complex RBBB and LAFB Confirmed by Dene Gentry [phone]) on 09/04/2021 1:11:40 PM  Radiology CT Head Wo Contrast  Result Date: 09/04/2021 CLINICAL DATA:  Mental status change, unknown cause EXAM: CT HEAD WITHOUT CONTRAST TECHNIQUE: Contiguous axial images were obtained from the base of the skull through the vertex without intravenous contrast. RADIATION DOSE REDUCTION: This exam was performed according to the departmental dose-optimization program which includes automated exposure control, adjustment of the mA and/or kV according to patient size and/or use of iterative reconstruction technique.  COMPARISON:  Head CT 07/03/2021 FINDINGS: Brain: No evidence of acute infarction, hemorrhage, hydrocephalus, extra-axial collection or mass lesion/mass effect. The ventricles are unchanged in size.Confluent periventricular and subcortical white matter hypoattenuation, which is nonspecific but likely sequela of chronic small vessel ischemic disease.Mild cerebral atrophy Vascular: Vascular calcifications.  No hyperdense vessel. Skull: Negative for skull fracture. Sinuses/Orbits: Mild ethmoid air cell mucosal thickening. Orbits are unremarkable. Mastoid air cells are clear. Other: None. IMPRESSION: No acute intracranial abnormality. Chronic small vessel ischemic disease and cerebral atrophy. Electronically Signed   By: Maurine Simmering M.D.   On: 09/04/2021 14:05   DG Chest Port 1 View  Result Date: 09/04/2021 CLINICAL DATA:  Cough, altered mental status EXAM: PORTABLE CHEST 1 VIEW COMPARISON:  07/03/2021 FINDINGS: Transverse diameter of heart is increased. There are no signs of pulmonary edema or focal pulmonary consolidation. There is no pleural effusion or pneumothorax. No significant interval changes are noted in the lung fields. IMPRESSION: No active disease.  Electronically Signed   By: Elmer Picker M.D.   On: 09/04/2021 13:46    Procedures Procedures    Medications Ordered in ED Medications  sodium chloride 0.9 % bolus 500 mL (500 mLs Intravenous New Bag/Given 09/04/21 1526)    ED Course/ Medical Decision Making/ A&P                           Medical Decision Making Amount and/or Complexity of Data Reviewed Labs: ordered. Radiology: ordered.    Medical Screen Complete  This patient presented to the ED with complaint of AMS, lethargy.  This complaint involves an extensive number of treatment options. The initial differential diagnosis includes, but is not limited to, accidental overdose, metabolic abnormality, infection, etc.  This presentation is: Acute, Self-Limited, Previously Undiagnosed, Uncertain Prognosis, Complicated, Systemic Symptoms, and Threat to Life/Bodily Function  Patient with known history of dementia presents with change in mental status.  Patient with increased lethargy today.  Patient with access to both Remeron and trazodone.  Patient's family reports that the patient has directly supervised medication administration.  However, the patient's family is concerned that the patient may have taken an inadvertent medication earlier today or overnight.  His presentation today is consistent with possible excessive trazodone use.  CT imaging of his head does not show any acute process.  Chest x-ray is clear.  Screening labs reveal elevated white count at 14.5, mildly elevated lactic acid of 2.1.  COVID and flu testing were negative. No overt evidence of infection on exam/workup. UA pending.  Patient requires admission for further work-up and treatment.  Hospitalist service made aware of case and will evaluate for same.  Additional history obtained:  Additional history obtained from [facility] External records from outside sources obtained and reviewed including prior ED visits and prior Inpatient records.     Lab Tests:  I ordered and personally interpreted labs.  The pertinent results include: CBC, CMP, lactic acid, BNP, troponin, COVID, flu   Imaging Studies ordered:  I ordered imaging studies including CT head, chest x-ray I independently visualized and interpreted obtained imaging which showed NAD I agree with the radiologist interpretation.   Cardiac Monitoring:  The patient was maintained on a cardiac monitor.  I personally viewed and interpreted the cardiac monitor which showed an underlying rhythm of: NSR Problem List / ED Course:  AMS   Reevaluation:  After the interventions noted above, I reevaluated the patient and found that they have: improved  Disposition:  After consideration of the diagnostic results and the patients response to treatment, I feel that the patent would benefit from admission.   CRITICAL CARE Performed by: Valarie Merino   Total critical care time: 30 minutes  Critical care time was exclusive of separately billable procedures and treating other patients.  Critical care was necessary to treat or prevent imminent or life-threatening deterioration.  Critical care was time spent personally by me on the following activities: development of treatment plan with patient and/or surrogate as well as nursing, discussions with consultants, evaluation of patient's response to treatment, examination of patient, obtaining history from patient or surrogate, ordering and performing treatments and interventions, ordering and review of laboratory studies, ordering and review of radiographic studies, pulse oximetry and re-evaluation of patient's condition.          Final Clinical Impression(s) / ED Diagnoses Final diagnoses:  Altered mental status, unspecified altered mental status type    Rx / DC Orders ED Discharge Orders     None         Valarie Merino, MD 09/04/21 1635

## 2021-09-04 NOTE — ED Notes (Signed)
Catheterization was unsuccessful, was only able to fill half of the urine sample cup out of the catheterization kit. The urine was bloody and bright red. Nurse aware.

## 2021-09-04 NOTE — Telephone Encounter (Signed)
[facility] Night - Client Nonclinical Telephone Record  [facility] Client [facility] Night - Client Client Site [place] - Night Provider Alma Friendly - NP Contact Type Call Who Is Calling Physician / Provider / Hospital Call Type Provider Call Message Only Reason for Call Request to send message to Office Initial Comment Caller states she is Marissa, a Education officer, museum with [employer]. She wants to let the provider know that she visited the patient today. The patient took both cups of medication by accident. She is waiting on a telephone call back from the daughter of the patient to find out what kind of medication was taken. Mable Fill can be reached at [phone] Additional Comment The patient is [name] DOB 11-Apr-1933 Patient Name [name] Requesting Provider Alexander. Time Disposition Final User 09/03/2021 5:33:17 PM General Information Provided Yes Valere Dross Call Closed By: Valere Dross Transaction Date/Time: 09/03/2021 5:24:38 PM (ET

## 2021-09-04 NOTE — ED Notes (Signed)
Pt started to vomit after administration of narcan. Pt stated "please don't do this to me. Lord please I'm gonna die." Pt more alert after narcan administration and able to cough up secretions. Dr. [place] notified.

## 2021-09-04 NOTE — ED Triage Notes (Signed)
Pt BIB EMS from home. Per family pt was more lethargic than yesterday. Per EMS pts pupils are pinpoint, but unsure of pt taking medications today. Pt has difficulty hearing.  Hx of Afib.   CBG 94

## 2021-09-04 NOTE — Telephone Encounter (Signed)
[name] - Client TELEPHONE ADVICE RECORD [facility] Patient Name: [name] Gender: Male DOB: [birth year] Age: 88 Y 11 M 5 D Return Phone Number: [phone] (Primary) Address: City/ State/ Zip: [place]  [address] Client [name] - Client Client Site [place] [name] Provider Alma Friendly - NP Contact Type Call Who Is Calling Patient / Member / Family / Caregiver Call Type Triage / Clinical Caller Name Lemmie Evens Relationship To Patient Daughter Return Phone Number [phone] (Primary) Chief Complaint BREATHING - shortness of breath or sounds breathless Reason for Call Symptomatic / Request for Health Information Initial Comment The patient is calling because her father had been not waking up much, his eyes have been a blank stare and there is a long time between each breath. Last Friday he was having horrible hallucinations as well. He has hallucinations. He fell and hit his head last week as well. His eyes roll back in his head. Translation No Nurse Assessment Nurse: D'Heur Lucia Gaskins, RN, Adrienne Date/Time (Eastern Time): 09/04/2021 11:26:09 AM Confirm and document reason for call. If symptomatic, describe symptoms. ---The patient is calling because her father had been not waking up much, his eyes have been a blank stare and there is a long time between each breath. Last Friday he was having horrible hallucinations as well. He has hallucinations. He fell and hit his head last week as well; daughter took him in. His eyes roll back in his head. Daughter states her father is very confused. Does the patient have any new or worsening symptoms? ---Yes Will a triage be completed? ---Yes Related visit to physician within the last 2 weeks? ---Yes Does the PT have any chronic conditions? (i.e. diabetes, asthma, this includes High risk factors for pregnancy,  etc.) ---Yes List chronic conditions. ---Leukemia (CMML)-diagnosed in July; Is this a behavioral health or substance abuse call? ---No PLEASE NOTE: All timestamps contained within this report are represented as [place] Standard Time. CONFIDENTIALTY NOTICE: This fax transmission is intended only for the addressee. It contains information that is legally privileged, confidential or otherwise protected from use or disclosure. If you are not the intended recipient, you are strictly prohibited from reviewing, disclosing, copying using or disseminating any of this information or taking any action in reliance on or regarding this information. If you have received this fax in error, please notify us immediately by telephone so that we can arrange for its return to [place]. Phone: [phone], Toll-Free: [phone], Fax: [phone] Page: 2 of 2 Call Id: [id number] Guidelines Guideline Title Affirmed Question Affirmed Notes Nurse Date/Time Eilene Ghazi Time) Confusion - Delirium [1] Difficult to awaken or acting confused (e.g., disoriented, slurred speech) AND [2] present now AND [3] new-onset D'Heur Lucia Gaskins, RN, Adrienne 09/04/2021 11:31:08 AM Disp. Time Eilene Ghazi Time) Disposition Final User 09/04/2021 11:22:49 AM Send to Urgent Queue Abigail Butts 09/04/2021 11:32:00 AM Call EMS 911 Now Yes D'Heur Lucia Gaskins, RN, Vincente Liberty 09/04/2021 11:39:30 AM 911 Outcome Documentation D'Heur Lucia Gaskins, RN, Vincente Liberty Reason: Unable to reach caller Final Disposition 09/04/2021 11:32:00 AM Call EMS 911 Now Yes D'Heur Lucia Gaskins, RN, Vincente Liberty Caller Disagree/Comply Comply Caller Understands Yes PreDisposition Call Doctor Care Advice Given Per Guideline CALL EMS 911 NOW: CARE ADVICE given per Confusion-Delirium (Adult) guideline

## 2021-09-04 NOTE — ED Notes (Addendum)
During the bladder scan the device read 375m's the first scan and 2596ms the second scan.

## 2021-09-04 NOTE — ED Notes (Signed)
RN attempted to insert foley catheter. Resistance met when trying to advance cathter. Dr. [place] notified.

## 2021-09-05 ENCOUNTER — Other Ambulatory Visit: Payer: Self-pay | Admitting: Primary Care

## 2021-09-05 DIAGNOSIS — R310 Gross hematuria: Secondary | ICD-10-CM | POA: Diagnosis not present

## 2021-09-05 DIAGNOSIS — F03918 Unspecified dementia, unspecified severity, with other behavioral disturbance: Secondary | ICD-10-CM | POA: Diagnosis present

## 2021-09-05 DIAGNOSIS — T50902A Poisoning by unspecified drugs, medicaments and biological substances, intentional self-harm, initial encounter: Secondary | ICD-10-CM | POA: Diagnosis not present

## 2021-09-05 DIAGNOSIS — N138 Other obstructive and reflux uropathy: Secondary | ICD-10-CM | POA: Diagnosis present

## 2021-09-05 DIAGNOSIS — E876 Hypokalemia: Secondary | ICD-10-CM | POA: Diagnosis present

## 2021-09-05 DIAGNOSIS — E860 Dehydration: Secondary | ICD-10-CM | POA: Diagnosis present

## 2021-09-05 DIAGNOSIS — R338 Other retention of urine: Secondary | ICD-10-CM | POA: Diagnosis not present

## 2021-09-05 DIAGNOSIS — F322 Major depressive disorder, single episode, severe without psychotic features: Secondary | ICD-10-CM

## 2021-09-05 DIAGNOSIS — R3912 Poor urinary stream: Secondary | ICD-10-CM | POA: Diagnosis not present

## 2021-09-05 DIAGNOSIS — F0393 Unspecified dementia, unspecified severity, with mood disturbance: Secondary | ICD-10-CM | POA: Diagnosis present

## 2021-09-05 DIAGNOSIS — D510 Vitamin B12 deficiency anemia due to intrinsic factor deficiency: Secondary | ICD-10-CM | POA: Diagnosis present

## 2021-09-05 DIAGNOSIS — G928 Other toxic encephalopathy: Secondary | ICD-10-CM

## 2021-09-05 DIAGNOSIS — I4891 Unspecified atrial fibrillation: Secondary | ICD-10-CM | POA: Diagnosis present

## 2021-09-05 DIAGNOSIS — N179 Acute kidney failure, unspecified: Secondary | ICD-10-CM | POA: Diagnosis present

## 2021-09-05 DIAGNOSIS — G9341 Metabolic encephalopathy: Secondary | ICD-10-CM | POA: Diagnosis present

## 2021-09-05 DIAGNOSIS — G4700 Insomnia, unspecified: Secondary | ICD-10-CM | POA: Diagnosis present

## 2021-09-05 DIAGNOSIS — H6092 Unspecified otitis externa, left ear: Secondary | ICD-10-CM | POA: Diagnosis present

## 2021-09-05 DIAGNOSIS — J690 Pneumonitis due to inhalation of food and vomit: Secondary | ICD-10-CM | POA: Diagnosis present

## 2021-09-05 DIAGNOSIS — Z20822 Contact with and (suspected) exposure to covid-19: Secondary | ICD-10-CM | POA: Diagnosis present

## 2021-09-05 DIAGNOSIS — N401 Enlarged prostate with lower urinary tract symptoms: Secondary | ICD-10-CM | POA: Diagnosis not present

## 2021-09-05 DIAGNOSIS — Z66 Do not resuscitate: Secondary | ICD-10-CM | POA: Diagnosis present

## 2021-09-05 DIAGNOSIS — T8383XA Hemorrhage of genitourinary prosthetic devices, implants and grafts, initial encounter: Secondary | ICD-10-CM | POA: Diagnosis present

## 2021-09-05 DIAGNOSIS — Y658 Other specified misadventures during surgical and medical care: Secondary | ICD-10-CM | POA: Diagnosis present

## 2021-09-05 DIAGNOSIS — T402X2A Poisoning by other opioids, intentional self-harm, initial encounter: Secondary | ICD-10-CM | POA: Diagnosis present

## 2021-09-05 DIAGNOSIS — F0394 Unspecified dementia, unspecified severity, with anxiety: Secondary | ICD-10-CM | POA: Diagnosis present

## 2021-09-05 DIAGNOSIS — I10 Essential (primary) hypertension: Secondary | ICD-10-CM | POA: Diagnosis present

## 2021-09-05 DIAGNOSIS — R4182 Altered mental status, unspecified: Secondary | ICD-10-CM | POA: Diagnosis present

## 2021-09-05 DIAGNOSIS — E785 Hyperlipidemia, unspecified: Secondary | ICD-10-CM | POA: Diagnosis present

## 2021-09-05 DIAGNOSIS — E872 Acidosis, unspecified: Secondary | ICD-10-CM | POA: Diagnosis present

## 2021-09-05 DIAGNOSIS — D696 Thrombocytopenia, unspecified: Secondary | ICD-10-CM | POA: Diagnosis present

## 2021-09-05 DIAGNOSIS — F332 Major depressive disorder, recurrent severe without psychotic features: Secondary | ICD-10-CM | POA: Diagnosis not present

## 2021-09-05 DIAGNOSIS — I2510 Atherosclerotic heart disease of native coronary artery without angina pectoris: Secondary | ICD-10-CM | POA: Diagnosis present

## 2021-09-05 DIAGNOSIS — Z8616 Personal history of COVID-19: Secondary | ICD-10-CM | POA: Diagnosis not present

## 2021-09-05 LAB — MAGNESIUM: Magnesium: 1.9 mg/dL (ref 1.7–2.4)

## 2021-09-05 LAB — BASIC METABOLIC PANEL
Anion gap: 5 (ref 5–15)
BUN: 17 mg/dL (ref 8–23)
CO2: 25 mmol/L (ref 22–32)
Calcium: 8.3 mg/dL — ABNORMAL LOW (ref 8.9–10.3)
Chloride: 112 mmol/L — ABNORMAL HIGH (ref 98–111)
Creatinine, Ser: 1.3 mg/dL — ABNORMAL HIGH (ref 0.61–1.24)
GFR, Estimated: 54 mL/min — ABNORMAL LOW (ref >60)
Glucose, Bld: 149 mg/dL — ABNORMAL HIGH (ref 70–99)
Potassium: 4.4 mmol/L (ref 3.5–5.1)
Sodium: 142 mmol/L (ref 135–145)

## 2021-09-05 LAB — CBC
HCT: 41.7 % (ref 39.0–52.0)
Hemoglobin: 12.8 g/dL — ABNORMAL LOW (ref 13.0–17.0)
MCH: 27.2 pg (ref 26.0–34.0)
MCHC: 30.7 g/dL (ref 30.0–36.0)
MCV: 88.7 fL (ref 80.0–100.0)
Platelets: 55 10*3/uL — ABNORMAL LOW (ref 150–400)
RBC: 4.7 MIL/uL (ref 4.22–5.81)
RDW: 15.7 % — ABNORMAL HIGH (ref 11.5–15.5)
WBC: 51.5 10*3/uL (ref 4.0–10.5)
nRBC: 0 % (ref 0.0–0.2)

## 2021-09-05 LAB — GLUCOSE, CAPILLARY
Glucose-Capillary: 160 mg/dL — ABNORMAL HIGH (ref 70–99)
Glucose-Capillary: 146 mg/dL — ABNORMAL HIGH (ref 70–99)

## 2021-09-05 LAB — LACTIC ACID, PLASMA: Lactic Acid, Venous: 1.6 mmol/L (ref 0.5–1.9)

## 2021-09-05 LAB — PHOSPHORUS: Phosphorus: 3.4 mg/dL (ref 2.5–4.6)

## 2021-09-05 LAB — TSH: TSH: 0.3 u[IU]/mL — ABNORMAL LOW (ref 0.350–4.500)

## 2021-09-05 MED ORDER — LACTATED RINGERS IV BOLUS
1000.0000 mL | Freq: Once | INTRAVENOUS | Status: AC
  Administered 2021-09-05: 1000 mL via INTRAVENOUS

## 2021-09-05 MED ORDER — FINASTERIDE 5 MG PO TABS
5.0000 mg | ORAL_TABLET | Freq: Every day | ORAL | Status: DC
  Administered 2021-09-05 – 2021-09-17 (×13): 5 mg via ORAL
  Filled 2021-09-05 (×13): qty 1

## 2021-09-05 MED ORDER — FLUTICASONE PROPIONATE 50 MCG/ACT NA SUSP
1.0000 | Freq: Two times a day (BID) | NASAL | Status: DC
  Administered 2021-09-05 – 2021-09-17 (×25): 1 via NASAL
  Filled 2021-09-05: qty 16

## 2021-09-05 MED ORDER — LORATADINE 10 MG PO TABS
10.0000 mg | ORAL_TABLET | Freq: Every day | ORAL | Status: DC
  Administered 2021-09-05 – 2021-09-17 (×13): 10 mg via ORAL
  Filled 2021-09-05 (×13): qty 1

## 2021-09-05 MED ORDER — TRAMADOL HCL 50 MG PO TABS
50.0000 mg | ORAL_TABLET | Freq: Once | ORAL | Status: AC
  Administered 2021-09-05: 50 mg via ORAL
  Filled 2021-09-05: qty 1

## 2021-09-05 MED ORDER — METRONIDAZOLE 500 MG/100ML IV SOLN
500.0000 mg | Freq: Two times a day (BID) | INTRAVENOUS | Status: DC
  Administered 2021-09-05 – 2021-09-08 (×8): 500 mg via INTRAVENOUS
  Filled 2021-09-05 (×10): qty 100

## 2021-09-05 MED ORDER — SODIUM CHLORIDE 0.9 % IV SOLN
2.0000 g | INTRAVENOUS | Status: AC
  Administered 2021-09-05 – 2021-09-09 (×5): 2 g via INTRAVENOUS
  Filled 2021-09-05 (×5): qty 20

## 2021-09-05 NOTE — Progress Notes (Signed)
Pt complains of 10/10 bladder pain. Abdomen distended and firm. Bladder tender to touch. Bladder scan with 415 ml residual. Bleeding from penis. Notified MD. New orders received.

## 2021-09-05 NOTE — Progress Notes (Signed)
Pharmacy Antibiotic Note  [name] is a 88 y.o. male admitted on 09/04/2021 with increased lethargy and confusion, possible aspiration pneumonia secondary to overdose.  Pharmacy has been consulted for ceftriaxone and Flagyl dosing.  WBC elevated from 14 >> 51, Tm 101.7  Plan: Ceftriaxone 2g IV q24 hours Flagyl '500mg'$  IV q12 hours Monitor clinical improvement and antibiotic length of therapy F/u culture data and narrow antibiotic as able    Temp (24hrs), Avg:99.2 F (37.3 C), Min:97.6 F (36.4 C), Max:101.7 F (38.7 C)  Recent Labs  Lab 09/04/21 1341 09/04/21 1342 09/04/21 1705 09/05/21 0513 09/05/21 0901  WBC 14.5*  --   --   --  51.5*  CREATININE 0.99  --   --  1.30*  --   LATICACIDVEN  --  2.1* 1.4 1.6  --     Estimated Creatinine Clearance: 29.4 mL/min (A) (by C-G formula based on SCr of 1.3 mg/dL (H)).    Allergies  Allergen Reactions   Penicillins Hives and Other (See Comments)    Whelps, passed out Tolerates cephalosporins  Has patient had a PCN reaction causing immediate rash, facial/tongue/throat swelling, SOB or lightheadedness with hypotension:  yes Has patient had a PCN reaction causing severe rash involving mucus membranes or skin necrosis: no Has patient had a PCN reaction that required hospitalization: no Has patient had a PCN reaction occurring within the last 10 years: no If all of the above answers are "NO", then may proceed with Cephalosporin use.    Tape Hives and Other (See Comments)    Paper Tape    Antimicrobials this admission: Ceftriaxone 8/19 >>  Flagyl 8/19 >>   Dose adjustments this admission:  Microbiology results: 8/18 BCx: ngtd 8/18 UCx:  sent  Thank you for allowing pharmacy to be a part of this patient's care.  Dimple Nanas, PharmD, BCPS 09/05/2021 10:21 AM

## 2021-09-05 NOTE — Progress Notes (Addendum)
Notified by the patient's RN that patient has not voided from his Foley catheter since last in and out at 12:35 am that yielded 500 cc.  Vitals shows, the temperature was 37.1C, the heart rate 104 beats/minute, the blood pressure 102/58 mm Hg, the respiratory rate 9 breaths/minute, and the oxygen saturation 98% on.  88 y.o. male with medical history significant of HTN, HLD, CAD, history of choledocholithiasis, history of laparoscopic cholecystectomy, BPH, overactive bladder, OSA not on CPAP, cognitive impairment, anxiety/depression  admitted with altered mental status secondary to suspected drug overdose Dilaudid.   Toxic-metabolic encephalopathy secondary to Drug Overdose Intentional OD- presumed Dilaudid  UDS + Opiod - CBC, CMP, VBG/Lactate - Follow Acetaminophen, Salicylate, Osmolar Gap / Volatile Screen levels - EKG (check prolongd QRS, QT) - CTH and MRI Brain with no acute intracranial abnormality - seizure precautions - monitor CBG q 4 - Monitor respirations and neuro checks - will need sitter/ SI precautions and psych consult after acute illness  Hematuria Likely secondary to traumatic Foley insertion attempts in a patient with hx of BPH with obstruction - s/p caude Foley catheter placement with urine output of 500 cc at 12:35 am - Monitor for bleeding - Continue IVFs as ordered - Strict I/O's: alert provider if UOP < 0.5 mL/kg/hr - Hold anticoagulation until hematuria resolves     Rufina Falco, DNP, CCRN, FNP-C, AGACNP-BC Acute Care & Family Nurse Practitioner  [facility]  See Amion for personal pager PCCM on call pager [phone] until 7 am

## 2021-09-05 NOTE — Progress Notes (Signed)
Foley catheter discontinued with bleeding during removal. Clots and continuous bleeding from penis. MD notified. MD at bedside. Will continue to monitor.

## 2021-09-05 NOTE — ED Notes (Signed)
[place], NP notified that SBP in the 90s, pt spiked a fever, and had blood in urine. New orders received.

## 2021-09-05 NOTE — Consult Note (Signed)
[facility] Face-to-Face Psychiatry Consult   Reason for Consult: ? Concern for intentional overdose/suicide 88 year old male admitted with encephalopathy secondary to presumed intentional opioid overdose. Family found empty bottle of Dilaudid.'' Referring Physician:  [place] Eric, DO Patient Identification: [name] MRN:  [id number] Principal Diagnosis: Toxic metabolic encephalopathy Diagnosis:  Principal Problem:   Toxic metabolic encephalopathy Active Problems:   HLD (hyperlipidemia)   Essential hypertension   Coronary atherosclerosis   BPH (benign prostatic hyperplasia)   Drug overdose, intentional, initial encounter ([name])   Acute metabolic encephalopathy   Major depressive disorder, single episode, severe ([name])   Total Time spent with patient: 1 hour  Subjective:   [name] is a 88 y.o. male patient admitted with intentional overdose.  HPI:  [name] is a 88 y.o. male with past medical history significant for HTN, HLD, CAD, history of choledocholithiasis, history of laparoscopic cholecystectomy, BPH, overactive bladder, OSA not on CPAP, cognitive impairment, Anxiety/depression, Leukemia who presented to [facility] ED on 8/18 via EMS after patient was found with increased lethargy and confusion by his daughter. Patient unable to answer questions and history obtained from one of his daughters who is present at his bedside. Daughter reports that patient lives alone prior to this admission, he was living with his wife who passed in 14. Daughter reports that patient was admitted recently due to cognitive limitation and hallucinations secondary to Remeron which was prescribed after Trazodone was found to be ineffective for sleep. The hallucinations cleared after the Remeron was discontinued. Daughter reports that patient has been having worsening depression for which he was prescribed Zoloft by PCP. But she reports that her father has been talking about suicidal in the last few months. She  states that his driving license was taking away from him due to cognitive limitation and he does not like the idea of having a home health aid caring for him when the family suggested it. Per daughter, he made statement like  "I am going to blow my brains out with a gun" when he was at a bank recently.  Patient's daughter, they found empty Dilaudid container at home; which was apparently his wife's previous medication, she passed 2 years ago from pancreatic cancer. There is no evidence of drugs and alcohol abuse by the patient per family's report.  Past Psychiatric History: depression  Risk to Self:  yes, unable to contract for safety Risk to Others:  not known Prior Inpatient Therapy:  none Prior Outpatient Therapy:  none, receives Zoloft from PCP  Past Medical History:  Past Medical History:  Diagnosis Date   Arthralgia of multiple joints    limited mobility w/  independant adl's   BPH with obstruction/lower urinary tract symptoms    Chronic idiopathic monocytosis    followed by dr Waymon Budge--  per lov note 06/ 2017 persistant unexplained  with normal bone barrow bx   Coronary atherosclerosis of unspecified type of vessel, native or graft    cardiologist-  dr Stanford Breed -- per lov note 11-19-2014 ,  cardiac cath in 2000-- pLAD 60-70%,  small intermediate branch 70-80%,  mRCA 20%,  normal LV   Degenerative arthritis of spine    cervical and lumar   Diverticulosis of colon (without mention of hemorrhage)    Dysuria    chronic   Elevated PSA    Feeling of incomplete bladder emptying    Hiatal hernia    moderate per ct 11/ 2017   History of adenomatous polyp of colon  2008   History of atrial fibrillation    remote hx episode   History of COVID-19 03/05/2020   History of pancreatitis    07-01-2013  and 10-10-2015   History of prostatitis    2014   Hx of colonic polyps    Hyperlipidemia    Hypertension    Mouth sore    roof of mouth sore    Nephrolithiasis    Nocturia more  than twice per night    severe  w/ leakage   OSA (obstructive sleep apnea)    INTOLERANT CPAP   Osteoarthritis    Pernicious anemia    B12  Def.   Peyronie disease    Renal insufficiency    Sepsis ([place]) 03/22/2017   Thrombocytopenia, unspecified ([place]) hemotology/oncologist-  dr Waymon Budge   per dr Velta Addison note 06/ 2016  secondary to vitro clumping   Vitamin B deficiency     Past Surgical History:  Procedure Laterality Date   BONE MARROW BIOPSY  2011   normal   CARDIAC CATHETERIZATION  2000   per dr Kathyrn Drown note --  dLAD 60-70%,  small intermediate branch 70-80%,  mRCA 20%,  normal LV   CARDIOVASCULAR STRESS TEST  01-29-2013   dr Stanford Breed   normal nuclear study w/ no ischemia,  normal LV function and wall motion , ef 82%   CATARACT EXTRACTION W/ INTRAOCULAR LENS  IMPLANT, BILATERAL  08/2015   CHOLECYSTECTOMY  11/24/2010   Procedure: LAPAROSCOPIC CHOLECYSTECTOMY WITH INTRAOPERATIVE CHOLANGIOGRAM;  Surgeon: Earnstine Regal, MD;  Location: [facility];  Service: General;  Laterality: N/A;  c-arm   CYSTOSCOPY W/ URETERAL STENT PLACEMENT Left 12/17/2015   Procedure: CYSTOSCOPY WITH RETROGRADE PYELOGRAM/URETERAL STENT PLACEMENT;  Surgeon: Ardis Hughs, MD;  Location: [facility];  Service: Urology;  Laterality: Left;   CYSTOSCOPY WITH RETROGRADE PYELOGRAM, URETEROSCOPY AND STENT PLACEMENT Left 12/22/2015   Procedure: CYSTOSCOPY WITH LEFT RETROGRADE  URETEROSCOPY AND STENT PLACEMENT;  Surgeon: Carolan Clines, MD;  Location: [facility];  Service: Urology;  Laterality: Left;   ERCP N/A 07/04/2013   Procedure: ENDOSCOPIC RETROGRADE CHOLANGIOPANCREATOGRAPHY (ERCP);  Surgeon: Gatha Mayer, MD;  Location: [facility];  Service: Endoscopy;  Laterality: N/A;  MAC if available   ERCP N/A 10/12/2015   Procedure: ENDOSCOPIC RETROGRADE CHOLANGIOPANCREATOGRAPHY (ERCP);  Surgeon: [name], MD;  Location: [facility];  Service: Endoscopy;  Laterality: N/A;   HOLMIUM LASER  APPLICATION Left 65/04/6501   Procedure: HOLMIUM LASER APPLICATION;  Surgeon: Carolan Clines, MD;  Location: [facility];  Service: Urology;  Laterality: Left;   INGUINAL HERNIA REPAIR Right 01/25/2002   KNEE ARTHROSCOPY  x5   SATURATION BIOPSY OF PROSTATE  05-29-2007  and 01-26-2008   SHOULDER ARTHROSCOPY WITH OPEN ROTATOR CUFF REPAIR AND DISTAL CLAVICLE ACROMINECTOMY Right 11/19/2004   TOTAL HIP ARTHROPLASTY Right 05/21/2009   TOTAL KNEE ARTHROPLASTY Bilateral left 05-02-2003/  right  03-23-2010   TRANSTHORACIC ECHOCARDIOGRAM  12/23/2004   ef 60%, mild MV calcification without stenosis/  mild TR,  PASP 59mHg   UVULOPALATOPHARYNGOPLASTY  1993    w/  T & A   Family History:  Family History  Problem Relation Age of Onset   Peripheral vascular disease Mother    Colon cancer Father 413  Breast cancer Daughter    Breast cancer Daughter    Family Psychiatric  History:  Social History:  Social History   Substance and Sexual Activity  Alcohol Use No   Alcohol/week: 0.0 standard drinks  of alcohol     Social History   Substance and Sexual Activity  Drug Use No    Social History   Socioeconomic History   Marital status: Widowed    Spouse name: Not on file   Number of children: 2   Years of education: Not on file   Highest education level: Not on file  Occupational History   Occupation: retired    Fish farm manager: RETIRED  Tobacco Use   Smoking status: Former    Types: Pipe    Quit date: 01/18/1966    Years since quitting: 55.6   Smokeless tobacco: Never  Vaping Use   Vaping Use: Never used  Substance and Sexual Activity   Alcohol use: No    Alcohol/week: 0.0 standard drinks of alcohol   Drug use: No   Sexual activity: Not Currently  Other Topics Concern   Not on file  Social History Narrative   DNR   Widowed   2 daughters leave near by   Social Determinants of Health   Financial Resource Strain: Low Risk  (08/21/2021)   Overall Financial Resource  Strain (CARDIA)    Difficulty of Paying Living Expenses: Not hard at all  Food Insecurity: No Food Insecurity (08/21/2021)   Hunger Vital Sign    Worried About Running Out of Food in the Last Year: Never true    [place] in the Last Year: Never true  Transportation Needs: No Transportation Needs (08/21/2021)   PRAPARE - Hydrologist (Medical): No    Lack of Transportation (Non-Medical): No  Physical Activity: Inactive (08/21/2021)   Exercise Vital Sign    Days of Exercise per Week: 0 days    Minutes of Exercise per Session: 0 min  Stress: Stress Concern Present (08/21/2021)   [place]    Feeling of Stress : To some extent  Social Connections: Not on file   Additional Social History:    Allergies:   Allergies  Allergen Reactions   Penicillins Hives and Other (See Comments)    [place], passed out Tolerates cephalosporins  Has patient had a PCN reaction causing immediate rash, facial/tongue/throat swelling, SOB or lightheadedness with hypotension:  yes Has patient had a PCN reaction causing severe rash involving mucus membranes or skin necrosis: no Has patient had a PCN reaction that required hospitalization: no Has patient had a PCN reaction occurring within the last 10 years: no If all of the above answers are "NO", then may proceed with Cephalosporin use.    Tape Hives and Other (See Comments)    Paper Tape    Labs:  Results for orders placed or performed during the hospital encounter of 09/04/21 (from the past 48 hour(s))  Culture, blood (routine x 2)     Status: None (Preliminary result)   Collection Time: 09/04/21 12:41 PM   Specimen: BLOOD  Result Value Ref Range   Specimen Description      BLOOD LEFT ANTECUBITAL Performed at [facility], [place] [address] [place], [place] [address]    Special Requests      BOTTLES DRAWN AEROBIC AND ANAEROBIC Blood  Culture adequate volume Performed at [place] [address], [place], [place] [address]    Culture      NO GROWTH < 24 HOURS Performed at [place] [address], [place], [place] [address]    Report Status PENDING   Blood gas,  venous     Status: Abnormal   Collection Time: 09/04/21 12:41 PM  Result Value Ref Range   pH, Ven 7.27 7.25 - 7.43   pCO2, Ven 65 (H) 44 - 60 mmHg   pO2, Ven 55 (H) 32 - 45 mmHg   Bicarbonate 29.8 (H) 20.0 - 28.0 mmol/L   Acid-Base Excess 1.0 0.0 - 2.0 mmol/L   O2 Saturation 86.8 %   Patient temperature 37.0     Comment: Performed at [facility], [place] [address] [place], [place] [address]  CBC with Differential     Status: Abnormal   Collection Time: 09/04/21  1:41 PM  Result Value Ref Range   WBC 14.5 (H) 4.0 - 10.5 K/uL   RBC 5.46 4.22 - 5.81 MIL/uL   Hemoglobin 14.8 13.0 - 17.0 g/dL   HCT 47.6 39.0 - 52.0 %   MCV 87.2 80.0 - 100.0 fL   MCH 27.1 26.0 - 34.0 pg   MCHC 31.1 30.0 - 36.0 g/dL   RDW 15.9 (H) 11.5 - 15.5 %   Platelets 56 (L) 150 - 400 K/uL    Comment: SPECIMEN CHECKED FOR CLOTS Immature Platelet Fraction may be clinically indicated, consider ordering this additional test [id number] PLATELET COUNT CONFIRMED BY SMEAR    nRBC 0.0 0.0 - 0.2 %   Neutrophils Relative % 67 %   Neutro Abs 9.7 (H) 1.7 - 7.7 K/uL   Lymphocytes Relative 10 %   Lymphs Abs 1.5 0.7 - 4.0 K/uL   Monocytes Relative 19 %   Monocytes Absolute 2.8 (H) 0.1 - 1.0 K/uL   Eosinophils Relative 0 %   Eosinophils Absolute 0.0 0.0 - 0.5 K/uL   Basophils Relative 1 %   Basophils Absolute 0.1 0.0 - 0.1 K/uL   Immature Granulocytes 3 %   Abs Immature Granulocytes 0.39 (H) 0.00 - 0.07 K/uL    Comment: Performed at [facility], [place] [address] [place], [place] [address]  Comprehensive metabolic panel     Status: Abnormal   Collection Time: 09/04/21  1:41 PM  Result Value Ref Range   Sodium 140 135 -  145 mmol/L   Potassium 4.0 3.5 - 5.1 mmol/L   Chloride 109 98 - 111 mmol/L   CO2 25 22 - 32 mmol/L   Glucose, Bld 132 (H) 70 - 99 mg/dL    Comment: Glucose reference range applies only to samples taken after fasting for at least 8 hours.   BUN 11 8 - 23 mg/dL   Creatinine, Ser 0.99 0.61 - 1.24 mg/dL   Calcium 8.4 (L) 8.9 - 10.3 mg/dL   Total Protein 5.9 (L) 6.5 - 8.1 g/dL   Albumin 4.1 3.5 - 5.0 g/dL   AST 15 15 - 41 U/L   ALT 13 0 - 44 U/L   Alkaline Phosphatase 76 38 - 126 U/L   Total Bilirubin 0.8 0.3 - 1.2 mg/dL   GFR, Estimated >60 >60 mL/min    Comment: (NOTE) Calculated using the CKD-EPI Creatinine Equation (2021)    Anion gap 6 5 - 15    Comment: Performed at [facility], [place] [address] [place], [place] [address]  Troponin I (High Sensitivity)     Status: None   Collection Time: 09/04/21  1:41 PM  Result Value Ref Range   Troponin I (High Sensitivity) 4 <18 ng/L    Comment: (NOTE) Elevated high sensitivity troponin I (hsTnI) values and significant  changes across serial measurements may  suggest ACS but many other  chronic and acute conditions are known to elevate hsTnI results.  Refer to the "Links" section for chest pain algorithms and additional  guidance. Performed at [facility], [place] [address] [place], [place] [address]   Brain natriuretic peptide     Status: Abnormal   Collection Time: 09/04/21  1:42 PM  Result Value Ref Range   B Natriuretic Peptide 146.8 (H) 0.0 - 100.0 pg/mL    Comment: Performed at [facility], [place] [address] [place], [place] [address]  Lactic acid, plasma     Status: Abnormal   Collection Time: 09/04/21  1:42 PM  Result Value Ref Range   Lactic Acid, Venous 2.1 (HH) 0.5 - 1.9 mmol/L    Comment: CRITICAL RESULT CALLED TO, READ BACK BY AND VERIFIED WITH RN A WOODY AT 1417 09/04/21 CRUICKSHANK A Performed at [facility], [place] [address], [place], [place]  [address]   Resp Panel by RT-PCR (Flu A&B, Covid)     Status: None   Collection Time: 09/04/21  1:46 PM   Specimen: Nasal Swab  Result Value Ref Range   SARS Coronavirus 2 by RT PCR NEGATIVE NEGATIVE    Comment: (NOTE) SARS-CoV-2 target nucleic acids are NOT DETECTED.  The SARS-CoV-2 RNA is generally detectable in upper respiratory specimens during the acute phase of infection. The lowest concentration of SARS-CoV-2 viral copies this assay can detect is 138 copies/mL. A negative result does not preclude SARS-Cov-2 infection and should not be used as the sole basis for treatment or other patient management decisions. A negative result may occur with  improper specimen collection/handling, submission of specimen other than nasopharyngeal swab, presence of viral mutation(s) within the areas targeted by this assay, and inadequate number of viral copies(<138 copies/mL). A negative result must be combined with clinical observations, patient history, and epidemiological information. The expected result is Negative.  Fact Sheet for Patients:  EntrepreneurPulse.com.au  Fact Sheet for Healthcare Providers:  IncredibleEmployment.be  This test is no t yet approved or cleared by the [place] FDA and  has been authorized for detection and/or diagnosis of SARS-CoV-2 by FDA under an Emergency Use Authorization (EUA). This EUA will remain  in effect (meaning this test can be used) for the duration of the COVID-19 declaration under Section 564(b)(1) of the Act, 21 U.S.C.section 360bbb-3(b)(1), unless the authorization is terminated  or revoked sooner.       Influenza A by PCR NEGATIVE NEGATIVE   Influenza B by PCR NEGATIVE NEGATIVE    Comment: (NOTE) The Xpert Xpress SARS-CoV-2/FLU/RSV plus assay is intended as an aid in the diagnosis of influenza from Nasopharyngeal swab specimens and should not be used as a sole basis for treatment. Nasal washings  and aspirates are unacceptable for Xpert Xpress SARS-CoV-2/FLU/RSV testing.  Fact Sheet for Patients: EntrepreneurPulse.com.au  Fact Sheet for Healthcare Providers: IncredibleEmployment.be  This test is not yet approved or cleared by the [place] FDA and has been authorized for detection and/or diagnosis of SARS-CoV-2 by FDA under an Emergency Use Authorization (EUA). This EUA will remain in effect (meaning this test can be used) for the duration of the COVID-19 declaration under Section 564(b)(1) of the Act, 21 U.S.C. section 360bbb-3(b)(1), unless the authorization is terminated or revoked.  Performed at [facility], [place] [address] [place] [address] [place], [place] [address]   CBG monitoring, ED     Status: Abnormal   Collection Time: 09/04/21  1:56 PM  Result  Value Ref Range   Glucose-Capillary 122 (H) 70 - 99 mg/dL    Comment: Glucose reference range applies only to samples taken after fasting for at least 8 hours.  Culture, blood (routine x 2)     Status: None (Preliminary result)   Collection Time: 09/04/21  2:00 PM   Specimen: BLOOD  Result Value Ref Range   Specimen Description      BLOOD SITE NOT SPECIFIED Performed at [place] [address], [place], [place] [address]    Special Requests      BOTTLES DRAWN AEROBIC AND ANAEROBIC Blood Culture results may not be optimal due to an excessive volume of blood received in culture bottles Performed at [place] [address] [place] [address] [place], [place] [address]    Culture      NO GROWTH < 24 HOURS Performed at [place] [address], [place], [place] [address]    Report Status PENDING   Urinalysis, Routine w reflex microscopic Urine, In & Out Cath     Status: Abnormal   Collection Time: 09/04/21  4:27 PM  Result Value Ref Range   Color, Urine RED (A) YELLOW    Comment: BIOCHEMICALS MAY BE AFFECTED BY COLOR   APPearance  CLOUDY (A) CLEAR   Specific Gravity, Urine  1.005 - 1.030    TEST NOT REPORTED DUE TO COLOR INTERFERENCE OF URINE PIGMENT   pH  5.0 - 8.0    TEST NOT REPORTED DUE TO COLOR INTERFERENCE OF URINE PIGMENT   Glucose, UA (A) NEGATIVE mg/dL    TEST NOT REPORTED DUE TO COLOR INTERFERENCE OF URINE PIGMENT   Hgb urine dipstick (A) NEGATIVE    TEST NOT REPORTED DUE TO COLOR INTERFERENCE OF URINE PIGMENT   Bilirubin Urine (A) NEGATIVE    TEST NOT REPORTED DUE TO COLOR INTERFERENCE OF URINE PIGMENT   Ketones, ur (A) NEGATIVE mg/dL    TEST NOT REPORTED DUE TO COLOR INTERFERENCE OF URINE PIGMENT   Protein, ur (A) NEGATIVE mg/dL    TEST NOT REPORTED DUE TO COLOR INTERFERENCE OF URINE PIGMENT   Nitrite (A) NEGATIVE    TEST NOT REPORTED DUE TO COLOR INTERFERENCE OF URINE PIGMENT   Leukocytes,Ua (A) NEGATIVE    TEST NOT REPORTED DUE TO COLOR INTERFERENCE OF URINE PIGMENT    Comment: Performed at [facility], [place] [address] [place], [place] [address]  Urine rapid drug screen (hosp performed)     Status: Abnormal   Collection Time: 09/04/21  4:27 PM  Result Value Ref Range   Opiates POSITIVE (A) NONE DETECTED   Cocaine NONE DETECTED NONE DETECTED   Benzodiazepines NONE DETECTED NONE DETECTED   Amphetamines NONE DETECTED NONE DETECTED   Tetrahydrocannabinol NONE DETECTED NONE DETECTED   Barbiturates NONE DETECTED NONE DETECTED    Comment: (NOTE) DRUG SCREEN FOR MEDICAL PURPOSES ONLY.  IF CONFIRMATION IS NEEDED FOR ANY PURPOSE, NOTIFY LAB WITHIN 5 DAYS.  LOWEST DETECTABLE LIMITS FOR URINE DRUG SCREEN Drug Class                     Cutoff (ng/mL) Amphetamine and metabolites    1000 Barbiturate and metabolites    200 Benzodiazepine                 426 Tricyclics and metabolites     300 Opiates and metabolites        300 Cocaine and metabolites  300 THC                            50 Performed at [facility], [place] [address], [place], [place]  [address]   Urinalysis, Microscopic (reflex)     Status: Abnormal   Collection Time: 09/04/21  4:27 PM  Result Value Ref Range   RBC / HPF 21-50 0 - 5 RBC/hpf   WBC, UA 0-5 0 - 5 WBC/hpf   Bacteria, UA RARE (A) NONE SEEN   Squamous Epithelial / LPF NONE SEEN 0 - 5    Comment: Performed at [facility], [place] [address], [place], [place] [address]  Lactic acid, plasma     Status: None   Collection Time: 09/04/21  5:05 PM  Result Value Ref Range   Lactic Acid, Venous 1.4 0.5 - 1.9 mmol/L    Comment: Performed at [facility], [place] [address], [place], [place] [address]  Troponin I (High Sensitivity)     Status: None   Collection Time: 09/04/21  5:05 PM  Result Value Ref Range   Troponin I (High Sensitivity) 4 <18 ng/L    Comment: (NOTE) Elevated high sensitivity troponin I (hsTnI) values and significant  changes across serial measurements may suggest ACS but many other  chronic and acute conditions are known to elevate hsTnI results.  Refer to the "Links" section for chest pain algorithms and additional  guidance. Performed at [facility], [place] [address] [place], [place] [address]   CBG monitoring, ED     Status: Abnormal   Collection Time: 09/04/21 10:50 PM  Result Value Ref Range   Glucose-Capillary 125 (H) 70 - 99 mg/dL    Comment: Glucose reference range applies only to samples taken after fasting for at least 8 hours.  Basic metabolic panel     Status: Abnormal   Collection Time: 09/05/21  5:13 AM  Result Value Ref Range   Sodium 142 135 - 145 mmol/L   Potassium 4.4 3.5 - 5.1 mmol/L   Chloride 112 (H) 98 - 111 mmol/L   CO2 25 22 - 32 mmol/L   Glucose, Bld 149 (H) 70 - 99 mg/dL    Comment: Glucose reference range applies only to samples taken after fasting for at least 8 hours.   BUN 17 8 - 23 mg/dL   Creatinine, Ser 1.30 (H) 0.61 - 1.24 mg/dL   Calcium 8.3 (L) 8.9 - 10.3 mg/dL   GFR, Estimated 54 (L) >60 mL/min     Comment: (NOTE) Calculated using the CKD-EPI Creatinine Equation (2021)    Anion gap 5 5 - 15    Comment: Performed at [facility], [place] [address], [place], [place] [address]  Lactic acid, plasma     Status: None   Collection Time: 09/05/21  5:13 AM  Result Value Ref Range   Lactic Acid, Venous 1.6 0.5 - 1.9 mmol/L    Comment: Performed at [facility], [place] [address], [place], [place] [address]  TSH     Status: Abnormal   Collection Time: 09/05/21  5:13 AM  Result Value Ref Range   TSH 0.300 (L) 0.350 - 4.500 uIU/mL    Comment: Performed by a 3rd Generation assay with a functional sensitivity of <=0.01 uIU/mL. Performed at [facility], [place] [address], [place], [place] [address]   Magnesium     Status: None  Collection Time: 09/05/21  5:13 AM  Result Value Ref Range   Magnesium 1.9 1.7 - 2.4 mg/dL    Comment: Performed at [facility], [place] [address], [place], [place] [address]  Phosphorus     Status: None   Collection Time: 09/05/21  5:13 AM  Result Value Ref Range   Phosphorus 3.4 2.5 - 4.6 mg/dL    Comment: Performed at [facility], [place] [address] [place], [place] [address]  CBC     Status: Abnormal   Collection Time: 09/05/21  9:01 AM  Result Value Ref Range   WBC 51.5 (HH) 4.0 - 10.5 K/uL    Comment: REPEATED TO VERIFY WHITE COUNT CONFIRMED ON SMEAR THIS IS A RECOLLECTED SPECIMEN THIS CRITICAL RESULT HAS VERIFIED AND BEEN CALLED TO JULIA RN BY [place],M ON 08 19 2023 AT 0939, AND HAS BEEN READ BACK.     RBC 4.70 4.22 - 5.81 MIL/uL   Hemoglobin 12.8 (L) 13.0 - 17.0 g/dL   HCT 41.7 39.0 - 52.0 %   MCV 88.7 80.0 - 100.0 fL   MCH 27.2 26.0 - 34.0 pg   MCHC 30.7 30.0 - 36.0 g/dL   RDW 15.7 (H) 11.5 - 15.5 %   Platelets 55 (L) 150 - 400 K/uL    Comment: SPECIMEN CHECKED FOR CLOTS Immature Platelet Fraction may be clinically indicated, consider ordering this additional  test [id number] CONSISTENT WITH PREVIOUS RESULT REPEATED TO VERIFY    nRBC 0.0 0.0 - 0.2 %    Comment: Performed at [facility], [place] [address], [place], [place] [address]  Glucose, capillary     Status: Abnormal   Collection Time: 09/05/21 12:56 PM  Result Value Ref Range   Glucose-Capillary 160 (H) 70 - 99 mg/dL    Comment: Glucose reference range applies only to samples taken after fasting for at least 8 hours.   Comment 1 Notify RN     Current Facility-Administered Medications  Medication Dose Route Frequency Provider Last Rate Last Admin   0.9 %  sodium chloride infusion   Intravenous Continuous [place] [name] J, DO 125 mL/hr at 09/05/21 1019 Rate Change at 09/05/21 1019   acetaminophen (TYLENOL) tablet 650 mg  650 mg Oral Q6H PRN [place] [name] J, DO       Or   acetaminophen (TYLENOL) suppository 650 mg  650 mg Rectal Q6H PRN [place] [place] [name] J, DO   650 mg at 09/04/21 2313   albuterol (PROVENTIL) (2.5 MG/3ML) 0.083% nebulizer solution 2.5 mg  2.5 mg Nebulization Q2H PRN [place] [name] J, DO       cefTRIAXone (ROCEPHIN) 2 g in sodium chloride 0.9 % 100 mL IVPB  2 g Intravenous Q24H [place] Donnamarie Poag, DO 200 mL/hr at 09/05/21 1241 2 g at 09/05/21 1241   ciprofloxacin-dexamethasone (CIPRODEX) 0.3-0.1 % OTIC (EAR) suspension 3 drop  3 drop Left EAR TID Dimple Nanas, RPH   3 drop at 09/05/21 1019   finasteride (PROSCAR) tablet 5 mg  5 mg Oral Daily [place] [name] J, DO       fluticasone (FLONASE) 50 MCG/ACT nasal spray 1 spray  1 spray Each Nare BID [place] [name] J, DO       loratadine (CLARITIN) tablet 10 mg  10 mg Oral Daily [place] [name] J, DO   10 mg at 09/05/21 1323   metroNIDAZOLE (FLAGYL) IVPB 500 mg  500 mg Intravenous Q12H [place] [name] J, DO 100 mL/hr at 09/05/21 1328 500 mg at 09/05/21  1328   naloxone (NARCAN) injection 0.4 mg  0.4 mg Intravenous PRN [place] Donnamarie Poag, DO       ondansetron [facility]) tablet 4 mg  4 mg Oral Q6H PRN [place] Donnamarie Poag, DO       Or    ondansetron [facility]) injection 4 mg  4 mg Intravenous Q6H PRN [place] Donnamarie Poag, DO   4 mg at 09/04/21 1745   polyethylene glycol (MIRALAX / GLYCOLAX) packet 17 g  17 g Oral Daily PRN [place] [name] J, DO       sertraline (ZOLOFT) tablet 50 mg  50 mg Oral Daily [place] Donnamarie Poag, DO   50 mg at 09/05/21 1017   tamsulosin (FLOMAX) capsule 0.8 mg  0.8 mg Oral Daily [place] Donnamarie Poag, DO   0.8 mg at 09/05/21 1017   traMADol (ULTRAM) tablet 50 mg  50 mg Oral Once [place] Donnamarie Poag, DO        Musculoskeletal: Strength & Muscle Tone: within normal limits Gait & Station: unsteady Patient leans: N/A    Psychiatric Specialty Exam:  Presentation  General Appearance: Appropriate for Environment  Eye Contact:Minimal  Speech:Slow  Speech Volume:Decreased  Handedness:Right   Mood and Affect  Mood:Dysphoric  Affect:Constricted   Thought Process  Thought Processes:-- (unable to access due to cognitive limitation)  Descriptions of Associations:Intact  Orientation:-- (only to person and place not to time)  Thought Content:-- (unable to assess due to cognitive limitation)  History of Schizophrenia/Schizoaffective disorder:No data recorded Duration of Psychotic Symptoms:No data recorded Hallucinations:Hallucinations: None  Ideas of Reference:None  Suicidal Thoughts:Suicidal Thoughts: Yes, Active SI Active Intent and/or Plan: With Intent  Homicidal Thoughts:Homicidal Thoughts: No   Sensorium  Memory:Immediate Fair; Recent Poor; Remote Fair  Judgment:Poor  Insight:Poor   Executive Functions  Concentration:Fair  Attention Span:Fair  Recall:Poor  Fund of Knowledge:Poor  Language:Good   Psychomotor Activity  Psychomotor Activity:Psychomotor Activity: Decreased; Psychomotor Retardation   Assets  Assets:Social Support   Sleep  Sleep:Sleep: Fair   Physical Exam: Physical Exam Review of Systems  Psychiatric/Behavioral:  Positive for depression and suicidal ideas.  The patient is nervous/anxious.    Blood pressure (!) 102/58, pulse (!) 104, temperature 98.7 F (37.1 C), temperature source Oral, resp. rate 18, SpO2 98 %. There is no height or weight on file to calculate BMI.  Treatment Plan Summary: 88 year old male with multiple medical problems with worsening depressive symptoms. He was admitted to the hospital after he intentionally attempted suicide by overdosing on medication. Today, patient is unable to contract for safety, he will benefit from psychiatric inpatient admission after he is medically stabilized.  Plan/Recommendations: -Continue 1:1 sitter for safety -Consider increasing Zoloft to 75 mg daily for depression -Consider social worker consult to facilitate geriatric psychiatric inpatient admission after pt is medically stabilized  Disposition: Recommend psychiatric Inpatient admission when medically cleared. Supportive therapy provided about ongoing stressors. Psychiatric service will follow the patient in order to monitor his [place]grass  Corena Pilgrim, MD 09/05/2021 3:55 PM

## 2021-09-05 NOTE — Consult Note (Signed)
Urology Consult  Referring physician: Dr. [place] Reason for referral: gross hematuria, difficult foley  Chief Complaint: Inability to urinate  History of Present Illness: [name] is a 88yo with a history of BPH followed by Dr. Gloriann Loan at [facility] who was admitted with encephalopathy. During his hospitalization multiple attempts were made to place a foley which were unsuccessful. After foley placement attempt he developed hematuria. He has not been able to urinate for 3 hours. He is complaining of suprapubic pain.   Past Medical History:  Diagnosis Date   Arthralgia of multiple joints    limited mobility w/  independant adl's   BPH with obstruction/lower urinary tract symptoms    Chronic idiopathic monocytosis    followed by dr Waymon Budge--  per lov note 06/ 2017 persistant unexplained  with normal bone barrow bx   Coronary atherosclerosis of unspecified type of vessel, native or graft    cardiologist-  dr Stanford Breed -- per lov note 11-19-2014 ,  cardiac cath in 2000-- pLAD 60-70%,  small intermediate branch 70-80%,  mRCA 20%,  normal LV   Degenerative arthritis of spine    cervical and lumar   Diverticulosis of colon (without mention of hemorrhage)    Dysuria    chronic   Elevated PSA    Feeling of incomplete bladder emptying    Hiatal hernia    moderate per ct 11/ 2017   History of adenomatous polyp of colon    2008   History of atrial fibrillation    remote hx episode   History of COVID-19 03/05/2020   History of pancreatitis    07-01-2013  and 10-10-2015   History of prostatitis    2014   Hx of colonic polyps    Hyperlipidemia    Hypertension    Mouth sore    roof of mouth sore    Nephrolithiasis    Nocturia more than twice per night    severe  w/ leakage   OSA (obstructive sleep apnea)    INTOLERANT CPAP   Osteoarthritis    Pernicious anemia    B12  Def.   Peyronie disease    Renal insufficiency    Sepsis ([place]) 03/22/2017   Thrombocytopenia, unspecified ([place])  hemotology/oncologist-  dr Waymon Budge   per dr Velta Addison note 06/ 2016  secondary to vitro clumping   Vitamin B deficiency    Past Surgical History:  Procedure Laterality Date   BONE MARROW BIOPSY  2011   normal   CARDIAC CATHETERIZATION  2000   per dr Kathyrn Drown note --  dLAD 60-70%,  small intermediate branch 70-80%,  mRCA 20%,  normal LV   CARDIOVASCULAR STRESS TEST  01-29-2013   dr Stanford Breed   normal nuclear study w/ no ischemia,  normal LV function and wall motion , ef 82%   CATARACT EXTRACTION W/ INTRAOCULAR LENS  IMPLANT, BILATERAL  08/2015   CHOLECYSTECTOMY  11/24/2010   Procedure: LAPAROSCOPIC CHOLECYSTECTOMY WITH INTRAOPERATIVE CHOLANGIOGRAM;  Surgeon: Earnstine Regal, MD;  Location: [facility];  Service: General;  Laterality: N/A;  c-arm   CYSTOSCOPY W/ URETERAL STENT PLACEMENT Left 12/17/2015   Procedure: CYSTOSCOPY WITH RETROGRADE PYELOGRAM/URETERAL STENT PLACEMENT;  Surgeon: Ardis Hughs, MD;  Location: [facility];  Service: Urology;  Laterality: Left;   CYSTOSCOPY WITH RETROGRADE PYELOGRAM, URETEROSCOPY AND STENT PLACEMENT Left 12/22/2015   Procedure: CYSTOSCOPY WITH LEFT RETROGRADE  URETEROSCOPY AND STENT PLACEMENT;  Surgeon: Carolan Clines, MD;  Location: [facility];  Service: Urology;  Laterality: Left;  ERCP N/A 07/04/2013   Procedure: ENDOSCOPIC RETROGRADE CHOLANGIOPANCREATOGRAPHY (ERCP);  Surgeon: Gatha Mayer, MD;  Location: [facility];  Service: Endoscopy;  Laterality: N/A;  MAC if available   ERCP N/A 10/12/2015   Procedure: ENDOSCOPIC RETROGRADE CHOLANGIOPANCREATOGRAPHY (ERCP);  Surgeon: [name], MD;  [facility];  Service: Endoscopy;  Laterality: N/A;   HOLMIUM LASER APPLICATION Left 93/05/7015   Procedure: HOLMIUM LASER APPLICATION;  Surgeon: Carolan Clines, MD;  Location: [facility];  Service: Urology;  Laterality: Left;   INGUINAL HERNIA REPAIR Right 01/25/2002   KNEE ARTHROSCOPY  x5   SATURATION BIOPSY  OF PROSTATE  05-29-2007  and 01-26-2008   SHOULDER ARTHROSCOPY WITH OPEN ROTATOR CUFF REPAIR AND DISTAL CLAVICLE ACROMINECTOMY Right 11/19/2004   TOTAL HIP ARTHROPLASTY Right 05/21/2009   TOTAL KNEE ARTHROPLASTY Bilateral left 05-02-2003/  right  03-23-2010   TRANSTHORACIC ECHOCARDIOGRAM  12/23/2004   ef 60%, mild MV calcification without stenosis/  mild TR,  PASP 60mHg   UVULOPALATOPHARYNGOPLASTY  1993    w/  T & A    Medications: I have reviewed the patient's current medications. Allergies:  Allergies  Allergen Reactions   Penicillins Hives and Other (See Comments)    Whelps, passed out Tolerates cephalosporins  Has patient had a PCN reaction causing immediate rash, facial/tongue/throat swelling, SOB or lightheadedness with hypotension:  yes Has patient had a PCN reaction causing severe rash involving mucus membranes or skin necrosis: no Has patient had a PCN reaction that required hospitalization: no Has patient had a PCN reaction occurring within the last 10 years: no If all of the above answers are "NO", then may proceed with Cephalosporin use.    Tape Hives and Other (See Comments)    Paper Tape    Family History  Problem Relation Age of Onset   Peripheral vascular disease Mother    Colon cancer Father 460  Breast cancer Daughter    Breast cancer Daughter    Social History:  reports that he quit smoking about 55 years ago. His smoking use included pipe. He has never used smokeless tobacco. He reports that he does not drink alcohol and does not use drugs.  Review of Systems  Genitourinary:  Positive for decreased urine volume, difficulty urinating and hematuria.  All other systems reviewed and are negative.   Physical Exam:  Vital signs in last 24 hours: Temp:  [98.6 F (37 C)-101.7 F (38.7 C)] 98.6 F (37 C) (08/19 2008) Pulse Rate:  [104-123] 104 (08/19 2008) Resp:  [10-20] 17 (08/19 2008) BP: (91-124)/(50-78) 105/54 (08/19 2008) SpO2:  [91 %-99 %] 96 %  (08/19 2008) Physical Exam Vitals reviewed.  Constitutional:      Appearance: Normal appearance.  HENT:     Head: Normocephalic and atraumatic.     Mouth/Throat:     Mouth: Mucous membranes are dry.  Eyes:     Extraocular Movements: Extraocular movements intact.     Pupils: Pupils are equal, round, and reactive to light.  Cardiovascular:     Rate and Rhythm: Normal rate and regular rhythm.  Pulmonary:     Effort: Pulmonary effort is normal. No respiratory distress.  Abdominal:     General: Abdomen is flat. There is distension.     Tenderness: There is abdominal tenderness.  Genitourinary:    Penis: Normal.      Testes: Normal.  Musculoskeletal:        General: No swelling. Normal range of motion.     Cervical back:  Normal range of motion and neck supple.  Skin:    General: Skin is warm and dry.  Neurological:     Mental Status: He is alert.  Psychiatric:        Mood and Affect: Mood normal.        Behavior: Behavior normal.     Laboratory Data:  Results for orders placed or performed during the hospital encounter of 09/04/21 (from the past 72 hour(s))  Culture, blood (routine x 2)     Status: None (Preliminary result)   Collection Time: 09/04/21 12:41 PM   Specimen: BLOOD  Result Value Ref Range   Specimen Description      BLOOD LEFT ANTECUBITAL Performed at [facility], [place] [address] [place], [place] [address]    Special Requests      BOTTLES DRAWN AEROBIC AND ANAEROBIC Blood Culture adequate volume Performed at [facility] [address] [place], [place] [address]    Culture      NO GROWTH < 24 HOURS Performed at [facility] [address], [place], [place] [address]    Report Status PENDING   Blood gas, venous     Status: Abnormal   Collection Time: 09/04/21 12:41 PM  Result Value Ref Range   pH, Ven 7.27 7.25 - 7.43   pCO2, Ven 65 (H) 44 - 60 mmHg   pO2, Ven 55 (H) 32 - 45 mmHg   Bicarbonate 29.8 (H)  20.0 - 28.0 mmol/L   Acid-Base Excess 1.0 0.0 - 2.0 mmol/L   O2 Saturation 86.8 %   Patient temperature 37.0     Comment: Performed at [facility], [place] [address] [place], [place] [address]  CBC with Differential     Status: Abnormal   Collection Time: 09/04/21  1:41 PM  Result Value Ref Range   WBC 14.5 (H) 4.0 - 10.5 K/uL   RBC 5.46 4.22 - 5.81 MIL/uL   Hemoglobin 14.8 13.0 - 17.0 g/dL   HCT 47.6 39.0 - 52.0 %   MCV 87.2 80.0 - 100.0 fL   MCH 27.1 26.0 - 34.0 pg   MCHC 31.1 30.0 - 36.0 g/dL   RDW 15.9 (H) 11.5 - 15.5 %   Platelets 56 (L) 150 - 400 K/uL    Comment: SPECIMEN CHECKED FOR CLOTS Immature Platelet Fraction may be clinically indicated, consider ordering this additional test [id number] PLATELET COUNT CONFIRMED BY SMEAR    nRBC 0.0 0.0 - 0.2 %   Neutrophils Relative % 67 %   Neutro Abs 9.7 (H) 1.7 - 7.7 K/uL   Lymphocytes Relative 10 %   Lymphs Abs 1.5 0.7 - 4.0 K/uL   Monocytes Relative 19 %   Monocytes Absolute 2.8 (H) 0.1 - 1.0 K/uL   Eosinophils Relative 0 %   Eosinophils Absolute 0.0 0.0 - 0.5 K/uL   Basophils Relative 1 %   Basophils Absolute 0.1 0.0 - 0.1 K/uL   Immature Granulocytes 3 %   Abs Immature Granulocytes 0.39 (H) 0.00 - 0.07 K/uL    Comment: Performed at [facility], [place] [address] [place],  [address]  Comprehensive metabolic panel     Status: Abnormal   Collection Time: 09/04/21  1:41 PM  Result Value Ref Range   Sodium 140 135 - 145 mmol/L   Potassium 4.0 3.5 - 5.1 mmol/L   Chloride 109 98 - 111 mmol/L   CO2 25 22 - 32 mmol/L   Glucose, Bld  132 (H) 70 - 99 mg/dL    Comment: Glucose reference range applies only to samples taken after fasting for at least 8 hours.   BUN 11 8 - 23 mg/dL   Creatinine, Ser 0.99 0.61 - 1.24 mg/dL   Calcium 8.4 (L) 8.9 - 10.3 mg/dL   Total Protein 5.9 (L) 6.5 - 8.1 g/dL   Albumin 4.1 3.5 - 5.0 g/dL   AST 15 15 - 41 U/L   ALT 13 0 - 44 U/L   Alkaline Phosphatase  76 38 - 126 U/L   Total Bilirubin 0.8 0.3 - 1.2 mg/dL   GFR, Estimated >60 >60 mL/min    Comment: (NOTE) Calculated using the CKD-EPI Creatinine Equation (2021)    Anion gap 6 5 - 15    Comment: Performed at [facility], [place] [address], [place], [place] [address]  Troponin I (High Sensitivity)     Status: None   Collection Time: 09/04/21  1:41 PM  Result Value Ref Range   Troponin I (High Sensitivity) 4 <18 ng/L    Comment: (NOTE) Elevated high sensitivity troponin I (hsTnI) values and significant  changes across serial measurements may suggest ACS but many other  chronic and acute conditions are known to elevate hsTnI results.  Refer to the "Links" section for chest pain algorithms and additional  guidance. Performed at [facility], [place] [address], [place], [place] [address]   Brain natriuretic peptide     Status: Abnormal   Collection Time: 09/04/21  1:42 PM  Result Value Ref Range   B Natriuretic Peptide 146.8 (H) 0.0 - 100.0 pg/mL    Comment: Performed at [facility], [place] [address], [place], [place] [address]  Lactic acid, plasma     Status: Abnormal   Collection Time: 09/04/21  1:42 PM  Result Value Ref Range   Lactic Acid, Venous 2.1 (HH) 0.5 - 1.9 mmol/L    Comment: CRITICAL RESULT CALLED TO, READ BACK BY AND VERIFIED WITH RN A WOODY AT 1417 09/04/21 CRUICKSHANK A Performed at [facility], [place] [address] [place], [place] [address]   Resp Panel by RT-PCR (Flu A&B, Covid)     Status: None   Collection Time: 09/04/21  1:46 PM   Specimen: Nasal Swab  Result Value Ref Range   SARS Coronavirus 2 by RT PCR NEGATIVE NEGATIVE    Comment: (NOTE) SARS-CoV-2 target nucleic acids are NOT DETECTED.  The SARS-CoV-2 RNA is generally detectable in upper respiratory specimens during the acute phase of infection. The lowest concentration of SARS-CoV-2 viral copies this assay can detect is 138 copies/mL.  A negative result does not preclude SARS-Cov-2 infection and should not be used as the sole basis for treatment or other patient management decisions. A negative result may occur with  improper specimen collection/handling, submission of specimen other than nasopharyngeal swab, presence of viral mutation(s) within the areas targeted by this assay, and inadequate number of viral copies(<138 copies/mL). A negative result must be combined with clinical observations, patient history, and epidemiological information. The expected result is Negative.  Fact Sheet for Patients:  EntrepreneurPulse.com.au  Fact Sheet for Healthcare Providers:  IncredibleEmployment.be  This test is no t yet approved or cleared by the [place] FDA and  has been authorized for detection and/or diagnosis of SARS-CoV-2 by FDA under an Emergency Use Authorization (EUA). This EUA will remain  in effect (meaning this test can be used) for the duration of the COVID-19 declaration  under Section 564(b)(1) of the Act, 21 U.S.C.section 360bbb-3(b)(1), unless the authorization is terminated  or revoked sooner.       Influenza A by PCR NEGATIVE NEGATIVE   Influenza B by PCR NEGATIVE NEGATIVE    Comment: (NOTE) The Xpert Xpress SARS-CoV-2/FLU/RSV plus assay is intended as an aid in the diagnosis of influenza from Nasopharyngeal swab specimens and should not be used as a sole basis for treatment. Nasal washings and aspirates are unacceptable for Xpert Xpress SARS-CoV-2/FLU/RSV testing.  Fact Sheet for Patients: EntrepreneurPulse.com.au  Fact Sheet for Healthcare Providers: IncredibleEmployment.be  This test is not yet approved or cleared by the [place] FDA and has been authorized for detection and/or diagnosis of SARS-CoV-2 by FDA under an Emergency Use Authorization (EUA). This EUA will remain in effect (meaning this test can be used)  for the duration of the COVID-19 declaration under Section 564(b)(1) of the Act, 21 U.S.C. section 360bbb-3(b)(1), unless the authorization is terminated or revoked.  Performed at [facility], [place] [address] [place], [place] [address]   CBG monitoring, ED     Status: Abnormal   Collection Time: 09/04/21  1:56 PM  Result Value Ref Range   Glucose-Capillary 122 (H) 70 - 99 mg/dL    Comment: Glucose reference range applies only to samples taken after fasting for at least 8 hours.  Culture, blood (routine x 2)     Status: None (Preliminary result)   Collection Time: 09/04/21  2:00 PM   Specimen: BLOOD  Result Value Ref Range   Specimen Description      BLOOD SITE NOT SPECIFIED Performed at [facility] [address] [place], [place] [address]    Special Requests      BOTTLES DRAWN AEROBIC AND ANAEROBIC Blood Culture results may not be optimal due to an excessive volume of blood received in culture bottles Performed at [facility] [address] [place], [place] [address]    Culture      NO GROWTH < 24 HOURS Performed at [facility] [address], [place], [place] [address]    Report Status PENDING   Urinalysis, Routine w reflex microscopic Urine, In & Out Cath     Status: Abnormal   Collection Time: 09/04/21  4:27 PM  Result Value Ref Range   Color, Urine RED (A) YELLOW    Comment: BIOCHEMICALS MAY BE AFFECTED BY COLOR   APPearance CLOUDY (A) CLEAR   Specific Gravity, Urine  1.005 - 1.030    TEST NOT REPORTED DUE TO COLOR INTERFERENCE OF URINE PIGMENT   pH  5.0 - 8.0    TEST NOT REPORTED DUE TO COLOR INTERFERENCE OF URINE PIGMENT   Glucose, UA (A) NEGATIVE mg/dL    TEST NOT REPORTED DUE TO COLOR INTERFERENCE OF URINE PIGMENT   Hgb urine dipstick (A) NEGATIVE    TEST NOT REPORTED DUE TO COLOR INTERFERENCE OF URINE PIGMENT   Bilirubin Urine (A) NEGATIVE    TEST NOT REPORTED DUE TO COLOR INTERFERENCE OF  URINE PIGMENT   Ketones, ur (A) NEGATIVE mg/dL    TEST NOT REPORTED DUE TO COLOR INTERFERENCE OF URINE PIGMENT   Protein, ur (A) NEGATIVE mg/dL    TEST NOT REPORTED DUE TO COLOR INTERFERENCE OF URINE PIGMENT   Nitrite (A) NEGATIVE    TEST NOT REPORTED DUE TO COLOR INTERFERENCE OF URINE PIGMENT   Leukocytes,Ua (A) NEGATIVE    TEST NOT REPORTED DUE TO COLOR INTERFERENCE OF URINE  PIGMENT    Comment: Performed at [facility], [place] [address] [place], [place] [address]  Urine rapid drug screen (hosp performed)     Status: Abnormal   Collection Time: 09/04/21  4:27 PM  Result Value Ref Range   Opiates POSITIVE (A) NONE DETECTED   Cocaine NONE DETECTED NONE DETECTED   Benzodiazepines NONE DETECTED NONE DETECTED   Amphetamines NONE DETECTED NONE DETECTED   Tetrahydrocannabinol NONE DETECTED NONE DETECTED   Barbiturates NONE DETECTED NONE DETECTED    Comment: (NOTE) DRUG SCREEN FOR MEDICAL PURPOSES ONLY.  IF CONFIRMATION IS NEEDED FOR ANY PURPOSE, NOTIFY LAB WITHIN 5 DAYS.  LOWEST DETECTABLE LIMITS FOR URINE DRUG SCREEN Drug Class                     Cutoff (ng/mL) Amphetamine and metabolites    1000 Barbiturate and metabolites    200 Benzodiazepine                 403 Tricyclics and metabolites     300 Opiates and metabolites        300 Cocaine and metabolites        300 THC                            50 Performed at [facility], [place] [address], [place], [place] [address]   Urinalysis, Microscopic (reflex)     Status: Abnormal   Collection Time: 09/04/21  4:27 PM  Result Value Ref Range   RBC / HPF 21-50 0 - 5 RBC/hpf   WBC, UA 0-5 0 - 5 WBC/hpf   Bacteria, UA RARE (A) NONE SEEN   Squamous Epithelial / LPF NONE SEEN 0 - 5    Comment: Performed at [facility], [place] [address], [place], [place] [address]  Lactic acid, plasma     Status: None   Collection Time: 09/04/21  5:05 PM  Result Value Ref Range   Lactic Acid,  Venous 1.4 0.5 - 1.9 mmol/L    Comment: Performed at [facility], [place] [address], [place], [place] [address]  Troponin I (High Sensitivity)     Status: None   Collection Time: 09/04/21  5:05 PM  Result Value Ref Range   Troponin I (High Sensitivity) 4 <18 ng/L    Comment: (NOTE) Elevated high sensitivity troponin I (hsTnI) values and significant  changes across serial measurements may suggest ACS but many other  chronic and acute conditions are known to elevate hsTnI results.  Refer to the "Links" section for chest pain algorithms and additional  guidance. Performed at [facility], [place] [address], [place], [place] [address]   CBG monitoring, ED     Status: Abnormal   Collection Time: 09/04/21 10:50 PM  Result Value Ref Range   Glucose-Capillary 125 (H) 70 - 99 mg/dL    Comment: Glucose reference range applies only to samples taken after fasting for at least 8 hours.  Basic metabolic panel     Status: Abnormal   Collection Time: 09/05/21  5:13 AM  Result Value Ref Range   Sodium 142 135 - 145 mmol/L   Potassium 4.4 3.5 - 5.1 mmol/L   Chloride 112 (H) 98 - 111 mmol/L   CO2 25 22 - 32 mmol/L   Glucose, Bld 149 (H) 70 - 99 mg/dL    Comment: Glucose reference range applies only to samples  taken after fasting for at least 8 hours.   BUN 17 8 - 23 mg/dL   Creatinine, Ser 1.30 (H) 0.61 - 1.24 mg/dL   Calcium 8.3 (L) 8.9 - 10.3 mg/dL   GFR, Estimated 54 (L) >60 mL/min    Comment: (NOTE) Calculated using the CKD-EPI Creatinine Equation (2021)    Anion gap 5 5 - 15    Comment: Performed at [facility], [place] [address] [place], [place] [address]  Lactic acid, plasma     Status: None   Collection Time: 09/05/21  5:13 AM  Result Value Ref Range   Lactic Acid, Venous 1.6 0.5 - 1.9 mmol/L    Comment: Performed at [facility], [place] [address], [place], [place] [address]  TSH     Status: Abnormal   Collection  Time: 09/05/21  5:13 AM  Result Value Ref Range   TSH 0.300 (L) 0.350 - 4.500 uIU/mL    Comment: Performed by a 3rd Generation assay with a functional sensitivity of <=0.01 uIU/mL. Performed at [facility], [place] [address], [place], [place] [address]   Magnesium     Status: None   Collection Time: 09/05/21  5:13 AM  Result Value Ref Range   Magnesium 1.9 1.7 - 2.4 mg/dL    Comment: Performed at [facility], [place] [address], [place], [place] [address]  Phosphorus     Status: None   Collection Time: 09/05/21  5:13 AM  Result Value Ref Range   Phosphorus 3.4 2.5 - 4.6 mg/dL    Comment: Performed at [facility], [place] [address] [place], [place] [address]  CBC     Status: Abnormal   Collection Time: 09/05/21  9:01 AM  Result Value Ref Range   WBC 51.5 (HH) 4.0 - 10.5 K/uL    Comment: REPEATED TO VERIFY WHITE COUNT CONFIRMED ON SMEAR THIS IS A RECOLLECTED SPECIMEN THIS CRITICAL RESULT HAS VERIFIED AND BEEN CALLED TO JULIA RN BY [place],M ON 08 19 2023 AT 0939, AND HAS BEEN READ BACK.     RBC 4.70 4.22 - 5.81 MIL/uL   Hemoglobin 12.8 (L) 13.0 - 17.0 g/dL   HCT 41.7 39.0 - 52.0 %   MCV 88.7 80.0 - 100.0 fL   MCH 27.2 26.0 - 34.0 pg   MCHC 30.7 30.0 - 36.0 g/dL   RDW 15.7 (H) 11.5 - 15.5 %   Platelets 55 (L) 150 - 400 K/uL    Comment: SPECIMEN CHECKED FOR CLOTS Immature Platelet Fraction may be clinically indicated, consider ordering this additional test [id number] CONSISTENT WITH PREVIOUS RESULT REPEATED TO VERIFY    nRBC 0.0 0.0 - 0.2 %    Comment: Performed at [facility], [place] [address] [place], [place] [address]  Glucose, capillary     Status: Abnormal   Collection Time: 09/05/21 12:56 PM  Result Value Ref Range   Glucose-Capillary 160 (H) 70 - 99 mg/dL    Comment: Glucose reference range applies only to samples taken after fasting for at least 8 hours.   Comment 1 Notify RN   Glucose, capillary      Status: Abnormal   Collection Time: 09/05/21  8:07 PM  Result Value Ref Range   Glucose-Capillary 146 (H) 70 - 99 mg/dL    Comment: Glucose reference range applies only to samples taken after fasting for at least 8 hours.   Comment 1 Notify RN    Comment 2 Document in Chart  Recent Results (from the past 240 hour(s))  Urine Culture     Status: Abnormal   Collection Time: 08/29/21  6:20 AM   Specimen: Urine, Clean Catch  Result Value Ref Range Status   Specimen Description URINE, CLEAN CATCH  Final   Special Requests   Final    NONE Performed at [facility], [address], [place], [place] [address]    Culture MULTIPLE SPECIES PRESENT, SUGGEST RECOLLECTION (A)  Final   Report Status 08/30/2021 FINAL  Final  Culture, blood (routine x 2)     Status: None (Preliminary result)   Collection Time: 09/04/21 12:41 PM   Specimen: BLOOD  Result Value Ref Range Status   Specimen Description   Final    BLOOD LEFT ANTECUBITAL Performed at [place] [address], [place], [place] [address]    Special Requests   Final    BOTTLES DRAWN AEROBIC AND ANAEROBIC Blood Culture adequate volume Performed at [facility] [address], [place], [place] [address]    Culture   Final    NO GROWTH < 24 HOURS Performed at [facility] [address], [place],  [address]    Report Status PENDING  Incomplete  Resp Panel by RT-PCR (Flu A&B, Covid)     Status: None   Collection Time: 09/04/21  1:46 PM   Specimen: Nasal Swab  Result Value Ref Range Status   SARS Coronavirus 2 by RT PCR NEGATIVE NEGATIVE Final    Comment: (NOTE) SARS-CoV-2 target nucleic acids are NOT DETECTED.  The SARS-CoV-2 RNA is generally detectable in upper respiratory specimens during the acute phase of infection. The lowest concentration of SARS-CoV-2 viral copies this assay can detect is 138 copies/mL. A negative result does not preclude SARS-Cov-2 infection  and should not be used as the sole basis for treatment or other patient management decisions. A negative result may occur with  improper specimen collection/handling, submission of specimen other than nasopharyngeal swab, presence of viral mutation(s) within the areas targeted by this assay, and inadequate number of viral copies(<138 copies/mL). A negative result must be combined with clinical observations, patient history, and epidemiological information. The expected result is Negative.  Fact Sheet for Patients:  EntrepreneurPulse.com.au  Fact Sheet for Healthcare Providers:  IncredibleEmployment.be  This test is no t yet approved or cleared by the [place] FDA and  has been authorized for detection and/or diagnosis of SARS-CoV-2 by FDA under an Emergency Use Authorization (EUA). This EUA will remain  in effect (meaning this test can be used) for the duration of the COVID-19 declaration under Section 564(b)(1) of the Act, 21 U.S.C.section 360bbb-3(b)(1), unless the authorization is terminated  or revoked sooner.       Influenza A by PCR NEGATIVE NEGATIVE Final   Influenza B by PCR NEGATIVE NEGATIVE Final    Comment: (NOTE) The Xpert Xpress SARS-CoV-2/FLU/RSV plus assay is intended as an aid in the diagnosis of influenza from Nasopharyngeal swab specimens and should not be used as a sole basis for treatment. Nasal washings and aspirates are unacceptable for Xpert Xpress SARS-CoV-2/FLU/RSV testing.  Fact Sheet for Patients: EntrepreneurPulse.com.au  Fact Sheet for Healthcare Providers: IncredibleEmployment.be  This test is not yet approved or cleared by the [place] FDA and has been authorized for detection and/or diagnosis of SARS-CoV-2 by FDA under an Emergency Use Authorization (EUA). This EUA will remain in effect (meaning this test can be used) for the duration of the COVID-19  declaration  under Section 564(b)(1) of the Act, 21 U.S.C. section 360bbb-3(b)(1), unless the authorization is terminated or revoked.  Performed at [facility], [place] [address] [place], [place] [address]   Culture, blood (routine x 2)     Status: None (Preliminary result)   Collection Time: 09/04/21  2:00 PM   Specimen: BLOOD  Result Value Ref Range Status   Specimen Description   Final    BLOOD SITE NOT SPECIFIED Performed at [facility] [address], [place], [place] [address]    Special Requests   Final    BOTTLES DRAWN AEROBIC AND ANAEROBIC Blood Culture results may not be optimal due to an excessive volume of blood received in culture bottles Performed at [facility] [address], [place], [place] [address]    Culture   Final    NO GROWTH < 24 HOURS Performed at [facility] [address] [place],  [address]    Report Status PENDING  Incomplete   Creatinine: Recent Labs    09/04/21 1341 09/05/21 0513  CREATININE 0.99 1.30*   Baseline Creatinine: 0.9  Impression/Assessment:  87yo with BPH, urinary retention and gross hematuria  Plan:  20 french coude catheter placed without incident and 800cc of bloody urine drained. Please irrigate PRN for clots. The hematuria will likely resolve in the next 24-48 hours and is likely related to traumatic foley insertion.   [name] 09/05/2021, 9:31 PM

## 2021-09-05 NOTE — Progress Notes (Addendum)
Patient arrived to the unit around 2 am with a foley catheter with about 10 cc of blood. Nurse states that they had issues inserting the catheter which resulted in some trauma. Last urine output was at 12:35am. Bladder scanner revealed no retention.  Ouma (Nurse Practitioner) made aware. No new orders at the moment. Will continue to monitor patient's condition.

## 2021-09-05 NOTE — Progress Notes (Signed)
PROGRESS NOTE    MD. HOOS  ZOX:[id number] DOB: August 16, 1933 DOA: 09/04/2021 PCP: Doreene Nest, NP    Brief Narrative:   [name] is a 88 y.o. male with past medical history significant for HTN, HLD, CAD, history of choledocholithiasis, history of laparoscopic cholecystectomy, BPH, overactive bladder, OSA not on CPAP, cognitive impairment, anxiety/depression, Leukemia who presented to [facility] ED on 8/18 via EMS after patient was found with increased lethargy and confusion today by daughter.  Patient unable to answer questions and history obtained from both daughters present at bedside.  Patient currently lives alone but daughters place his daily medications out for him so there is no confusion.  Patient with recent hospitalizations for similar events recently, also reported previous hallucinations thought to be due to previous Remeron and trazodone use which has now been discontinued for roughly 1 week.  Daughter was also reported that patient did have access to a cough medicine that he may have taken.  They are unaware of any other sedating or narcotics available for him at home.  Only other new medication for patient is Ciprodex eardrops after having trauma to his left ear.   Received report from daughters that patient has had increased suicidal thoughts including stating "I am going to blow my brains out with a gun" when he was at a bank recently.  Patient's daughter also found empty Dilaudid container at home; which was apparently his wife's previous medication in which she has now passed 2 years ago from pancreatic cancer.  Concern for intentional overdose.   In the ED, temperature 97.6 F, HR 100, RR 12, BP 120/65, 92% on room air.  WBC 14.5, hemoglobin 14.8, platelets 56.  Sodium 140, potassium 4.0, chloride 109, CO2 25, BUN 11, creatinine 0.99.  Glucose 132.  LFTs/total bilirubin within normal limits.  Lactic acid 2.1.  BNP 196.8.  COVID-19 PCR negative.  Influenza A/B PCR  negative.  CT head without contrast with no acute intracranial abnormality, chronic small vessel ischemic disease and cerebral atrophy.  Chest x-ray with no acute cardiopulmonary disease process.  EDP consulted [facility] for admission for further evaluation and work-up of acute metabolic encephalopathy.  Assessment & Plan:   Acute metabolic encephalopathy 2/2 intentional opioid overdose Concern for suicidal attempt Patient presenting to the ED with recurrent confusion, lethargy and altered mental status.  Patient has been seen by his PCP and in the ED over the last few weeks with similar events.  Patient was recently stopped on Remeron and trazodone as likely contributing factors to his previous hallucinations/somnolence.  On arrival, patient was afebrile but with mild leukocytosis.  Chest x-ray unrevealing.  CT head without contrast negative for acute intracranial abnormality.  Given patient's pinpoint pupils, concern for narcotic induced somnolence.  Patient was given Narcan with immediate improvement of his mental status.  MR brain without contrast with no acute findings.  UDS positive for opiates. --Continue suicide precautions, one-to-one sitter --Psychiatry consultation: Pending --Continue supportive care, IV fluid hydration, Narcan as needed. --Discussed with daughters need removal of all weapons including guns from household --Anticipate need for inpatient psychiatry --If attempts to leave AMA, will need involuntary commitment   Lactic acidosis Lactic acid 2.1 on admission, likely secondary to dehydration in the setting of encephalopathy.  No focal infectious etiology found such far. --Lactic acid 2.1>1.6 --Continue IV fluid hydration   Leukocytosis Aspiration pneumonia WBC count 14.5 on admission; now increased to 51.5.  Concern for aspiration event following intentional overdose. --Blood cultures x2:  No growth less than 24 hours --urine culture: Pending --Ceftriaxone 2 g IV every 24  hours --Metronidazole 500 mg IV q12h --Continue IV fluid hydration --CBC daily   Left otitis externa --Continue Ciprodex eardrops 3 times daily   BPH: Continue tamsulosin 0.4 mg p.o. daily   Anxiety/depression: Continue sertraline    CAD/HTN/HLD: Currently not on medication outpatient   DVT prophylaxis: SCDs Start: 09/04/21 1721    Code Status: DNR Family Communication: Updated daughter present at bedside this morning  Disposition Plan:  Level of care: Telemetry Status is: Observation The patient will require care spanning > 2 midnights and should be moved to inpatient because: Initiation of IV antibiotics, continues to be confused although improved, will need psychiatry evaluation with likely need for inpatient psychiatry transfer given suspected suicide attempt/intentional overdose    Consultants:  Psychiatry: Pending  Procedures:  None  Antimicrobials:  Ceftriaxone 8/19>> Metronidazole 8/19>>   Subjective: Patient seen examined bedside, resting comfortably.  Alert but remains mildly confused.  Daughter present at bedside.  Daughter reports that she found multiple substances hidden throughout the house as well as a gun in which she removed.  She states there could be other guns in their residence and instructed her to further search and remove all weapons as well as additional narcotics if found.  Patient with no other specific complaints or concerns at this time other than irritation with Foley catheter.  Denies headache, no chest pain, no shortness of breath, no abdominal pain, no acute events overnight per nursing staff.  Objective: Vitals:   09/05/21 0200 09/05/21 0215 09/05/21 0237 09/05/21 0519  BP:  (!) 101/53 (!) 101/59 (!) 102/58  Pulse:  (!) 114 (!) 110 (!) 104  Resp:  20 18   Temp: 99.7 F (37.6 C)  99 F (37.2 C) 98.7 F (37.1 C)  TempSrc: Rectal  Oral Oral  SpO2:  96% 95% 98%    Intake/Output Summary (Last 24 hours) at 09/05/2021 1207 Last data  filed at 09/05/2021 0900 Gross per 24 hour  Intake 1740 ml  Output 520 ml  Net 1220 ml   There were no vitals filed for this visit.  Examination:  Physical Exam: GEN: NAD, alert, oriented to place ([place]) and Person Actor), but not time, or situation, elderly appearance HEENT: NCAT, PERRL, EOMI, sclera clear, MMM PULM: CTAB w/o wheezes/crackles, normal respiratory effort, on 2 L nasal cannula with SPO2 98% CV: RRR w/o M/G/R GI: abd soft, NTND, NABS, no R/G/M GU: Foley catheter noted with blood in urine MSK: no peripheral edema, muscle strength globally intact 5/5 bilateral upper/lower extremities NEURO: CN II-XII intact, no focal deficits, sensation to light touch intact PSYCH: normal mood/affect Integumentary: dry/intact, no rashes or wounds    Data Reviewed: I have personally reviewed following labs and imaging studies  CBC: Recent Labs  Lab 09/04/21 1341 09/05/21 0901  WBC 14.5* 51.5*  NEUTROABS 9.7*  --   HGB 14.8 12.8*  HCT 47.6 41.7  MCV 87.2 88.7  PLT 56* 55*   Basic Metabolic Panel: Recent Labs  Lab 09/04/21 1341 09/05/21 0513  NA 140 142  K 4.0 4.4  CL 109 112*  CO2 25 25  GLUCOSE 132* 149*  BUN 11 17  CREATININE 0.99 1.30*  CALCIUM 8.4* 8.3*  MG  --  1.9  PHOS  --  3.4   GFR: Estimated Creatinine Clearance: 29.4 mL/min (A) (by C-G formula based on SCr of 1.3 mg/dL (H)). Liver Function Tests: Recent Labs  Lab 09/04/21 1341  AST 15  ALT 13  ALKPHOS 76  BILITOT 0.8  PROT 5.9*  ALBUMIN 4.1   No results for input(s): "LIPASE", "AMYLASE" in the last 168 hours. No results for input(s): "AMMONIA" in the last 168 hours. Coagulation Profile: No results for input(s): "INR", "PROTIME" in the last 168 hours. Cardiac Enzymes: No results for input(s): "CKTOTAL", "CKMB", "CKMBINDEX", "TROPONINI" in the last 168 hours. BNP (last 3 results) No results for input(s): "PROBNP" in the last 8760 hours. HbA1C: No results for input(s):  "HGBA1C" in the last 72 hours. CBG: Recent Labs  Lab 09/04/21 1356 09/04/21 2250  GLUCAP 122* 125*   Lipid Profile: No results for input(s): "CHOL", "HDL", "LDLCALC", "TRIG", "CHOLHDL", "LDLDIRECT" in the last 72 hours. Thyroid Function Tests: Recent Labs    09/05/21 0513  TSH 0.300*   Anemia Panel: No results for input(s): "VITAMINB12", "FOLATE", "FERRITIN", "TIBC", "IRON", "RETICCTPCT" in the last 72 hours. Sepsis Labs: Recent Labs  Lab 09/04/21 1342 09/04/21 1705 09/05/21 0513  LATICACIDVEN 2.1* 1.4 1.6    Recent Results (from the past 240 hour(s))  Urine Culture     Status: Abnormal   Collection Time: 08/29/21  6:20 AM   Specimen: Urine, Clean Catch  Result Value Ref Range Status   Specimen Description URINE, CLEAN CATCH  Final   Special Requests   Final    NONE Performed at [facility], [address], [place], [place] [address]    Culture MULTIPLE SPECIES PRESENT, SUGGEST RECOLLECTION (A)  Final   Report Status 08/30/2021 FINAL  Final  Culture, blood (routine x 2)     Status: None (Preliminary result)   Collection Time: 09/04/21 12:41 PM   Specimen: BLOOD  Result Value Ref Range Status   Specimen Description   Final    BLOOD LEFT ANTECUBITAL Performed at [facility], [address] [place], [place] [address]    Special Requests   Final    BOTTLES DRAWN AEROBIC AND ANAEROBIC Blood Culture adequate volume Performed at [facility], [address] [place], [place] [address]    Culture   Final    NO GROWTH < 24 HOURS Performed at [facility], [address] [place], [place] [address]    Report Status PENDING  Incomplete  Resp Panel by RT-PCR (Flu A&B, Covid)     Status: None   Collection Time: 09/04/21  1:46 PM   Specimen: Nasal Swab  Result Value Ref Range Status   SARS Coronavirus 2 by RT PCR NEGATIVE NEGATIVE Final    Comment: (NOTE) SARS-CoV-2 target nucleic acids are NOT DETECTED.  The  SARS-CoV-2 RNA is generally detectable in upper respiratory specimens during the acute phase of infection. The lowest concentration of SARS-CoV-2 viral copies this assay can detect is 138 copies/mL. A negative result does not preclude SARS-Cov-2 infection and should not be used as the sole basis for treatment or other patient management decisions. A negative result may occur with  improper specimen collection/handling, submission of specimen other than nasopharyngeal swab, presence of viral mutation(s) within the areas targeted by this assay, and inadequate number of viral copies(<138 copies/mL). A negative result must be combined with clinical observations, patient history, and epidemiological information. The expected result is Negative.  Fact Sheet for Patients:  BloggerCourse.com  Fact Sheet for Healthcare Providers:  SeriousBroker.it  This test is no t yet approved or cleared by the [place] FDA and  has been authorized for detection  and/or diagnosis of SARS-CoV-2 by FDA under an Emergency Use Authorization (EUA). This EUA will remain  in effect (meaning this test can be used) for the duration of the COVID-19 declaration under Section 564(b)(1) of the Act, 21 U.S.C.section 360bbb-3(b)(1), unless the authorization is terminated  or revoked sooner.       Influenza A by PCR NEGATIVE NEGATIVE Final   Influenza B by PCR NEGATIVE NEGATIVE Final    Comment: (NOTE) The Xpert Xpress SARS-CoV-2/FLU/RSV plus assay is intended as an aid in the diagnosis of influenza from Nasopharyngeal swab specimens and should not be used as a sole basis for treatment. Nasal washings and aspirates are unacceptable for Xpert Xpress SARS-CoV-2/FLU/RSV testing.  Fact Sheet for Patients: BloggerCourse.com  Fact Sheet for Healthcare Providers: SeriousBroker.it  This test is not yet approved or  cleared by the [place] FDA and has been authorized for detection and/or diagnosis of SARS-CoV-2 by FDA under an Emergency Use Authorization (EUA). This EUA will remain in effect (meaning this test can be used) for the duration of the COVID-19 declaration under Section 564(b)(1) of the Act, 21 U.S.C. section 360bbb-3(b)(1), unless the authorization is terminated or revoked.  Performed at [facility], [address], [place], [place] [address]   Culture, blood (routine x 2)     Status: None (Preliminary result)   Collection Time: 09/04/21  2:00 PM   Specimen: BLOOD  Result Value Ref Range Status   Specimen Description   Final    BLOOD SITE NOT SPECIFIED Performed at [facility], [address], [place], [place] [address]    Special Requests   Final    BOTTLES DRAWN AEROBIC AND ANAEROBIC Blood Culture results may not be optimal due to an excessive volume of blood received in culture bottles Performed at [facility], [address], [place], [place] [address]    Culture   Final    NO GROWTH < 24 HOURS Performed at [facility], [address], [place], [place] [address]    Report Status PENDING  Incomplete         Radiology Studies: MR BRAIN WO CONTRAST  Result Date: 09/04/2021 CLINICAL DATA:  Mental status change EXAM: MRI HEAD WITHOUT CONTRAST TECHNIQUE: Multiplanar, multiecho pulse sequences of the brain and surrounding structures were obtained without intravenous contrast. COMPARISON:  No prior MRI, correlation is made with CT head 09/04/2021 FINDINGS: Brain: No restricted diffusion to suggest acute or subacute infarct. No acute hemorrhage, mass, mass effect, or midline shift. No hemosiderin deposition to suggest remote hemorrhage. Scattered and confluent T2 hyperintense signal in the periventricular white matter and pons, likely the sequela of moderate to severe chronic small vessel ischemic disease. Remote  lacunar infarct in the left thalamus. Vascular: Normal arterial flow voids. Skull and upper cervical spine: Normal marrow signal. Sinuses/Orbits: No acute finding. Status post bilateral lens replacements. Other: The mastoids are well aerated. IMPRESSION: No acute intracranial process. No evidence of acute or subacute infarct. No etiology is seen for the patient's altered mental status Electronically Signed   By: Wiliam Ke M.D.   On: 09/04/2021 22:22   CT Head Wo Contrast  Result Date: 09/04/2021 CLINICAL DATA:  Mental status change, unknown cause EXAM: CT HEAD WITHOUT CONTRAST TECHNIQUE: Contiguous axial images were obtained from the base of the skull through the vertex without intravenous contrast. RADIATION DOSE REDUCTION: This exam was performed according to the departmental dose-optimization program which includes automated exposure control, adjustment of  the mA and/or kV according to patient size and/or use of iterative reconstruction technique. COMPARISON:  Head CT 07/03/2021 FINDINGS: Brain: No evidence of acute infarction, hemorrhage, hydrocephalus, extra-axial collection or mass lesion/mass effect. The ventricles are unchanged in size.Confluent periventricular and subcortical white matter hypoattenuation, which is nonspecific but likely sequela of chronic small vessel ischemic disease.Mild cerebral atrophy Vascular: Vascular calcifications.  No hyperdense vessel. Skull: Negative for skull fracture. Sinuses/Orbits: Mild ethmoid air cell mucosal thickening. Orbits are unremarkable. Mastoid air cells are clear. Other: None. IMPRESSION: No acute intracranial abnormality. Chronic small vessel ischemic disease and cerebral atrophy. Electronically Signed   By: Caprice Renshaw M.D.   On: 09/04/2021 14:05   DG Chest Port 1 View  Result Date: 09/04/2021 CLINICAL DATA:  Cough, altered mental status EXAM: PORTABLE CHEST 1 VIEW COMPARISON:  07/03/2021 FINDINGS: Transverse diameter of heart is increased. There  are no signs of pulmonary edema or focal pulmonary consolidation. There is no pleural effusion or pneumothorax. No significant interval changes are noted in the lung fields. IMPRESSION: No active disease. Electronically Signed   By: Ernie Avena M.D.   On: 09/04/2021 13:46        Scheduled Meds:  ciprofloxacin-dexamethasone  3 drop Left EAR TID   sertraline  50 mg Oral Daily   tamsulosin  0.8 mg Oral Daily   Continuous Infusions:  sodium chloride 125 mL/hr at 09/05/21 1019   cefTRIAXone (ROCEPHIN)  IV     metronidazole       LOS: 0 days    Time spent: 53 minutes spent on chart review, discussion with nursing staff, consultants, updating family and interview/physical exam; more than 50% of that time was spent in counseling and/or coordination of care.    Alvira Philips [place], DO [facility] Available via Epic secure chat 7am-7pm After these hours, please refer to coverage provider listed on [email or web address] 09/05/2021, 12:07 PM

## 2021-09-06 DIAGNOSIS — G928 Other toxic encephalopathy: Secondary | ICD-10-CM | POA: Diagnosis not present

## 2021-09-06 LAB — GLUCOSE, CAPILLARY
Glucose-Capillary: 112 mg/dL — ABNORMAL HIGH (ref 70–99)
Glucose-Capillary: 113 mg/dL — ABNORMAL HIGH (ref 70–99)
Glucose-Capillary: 126 mg/dL — ABNORMAL HIGH (ref 70–99)
Glucose-Capillary: 132 mg/dL — ABNORMAL HIGH (ref 70–99)
Glucose-Capillary: 136 mg/dL — ABNORMAL HIGH (ref 70–99)
Glucose-Capillary: 97 mg/dL (ref 70–99)

## 2021-09-06 LAB — BASIC METABOLIC PANEL
Anion gap: 3 — ABNORMAL LOW (ref 5–15)
BUN: 18 mg/dL (ref 8–23)
CO2: 23 mmol/L (ref 22–32)
Calcium: 7.6 mg/dL — ABNORMAL LOW (ref 8.9–10.3)
Chloride: 114 mmol/L — ABNORMAL HIGH (ref 98–111)
Creatinine, Ser: 0.83 mg/dL (ref 0.61–1.24)
GFR, Estimated: >60 mL/min (ref >60)
Glucose, Bld: 129 mg/dL — ABNORMAL HIGH (ref 70–99)
Potassium: 3.7 mmol/L (ref 3.5–5.1)
Sodium: 140 mmol/L (ref 135–145)

## 2021-09-06 LAB — CBC WITH DIFFERENTIAL/PLATELET
Abs Immature Granulocytes: 0.63 10*3/uL — ABNORMAL HIGH (ref 0.00–0.07)
Basophils Absolute: 0 10*3/uL (ref 0.0–0.1)
Basophils Relative: 0 %
Eosinophils Absolute: 0 10*3/uL (ref 0.0–0.5)
Eosinophils Relative: 0 %
HCT: 33.1 % — ABNORMAL LOW (ref 39.0–52.0)
Hemoglobin: 10.5 g/dL — ABNORMAL LOW (ref 13.0–17.0)
Immature Granulocytes: 3 %
Lymphocytes Relative: 4 %
Lymphs Abs: 0.9 10*3/uL (ref 0.7–4.0)
MCH: 27.6 pg (ref 26.0–34.0)
MCHC: 31.7 g/dL (ref 30.0–36.0)
MCV: 87.1 fL (ref 80.0–100.0)
Monocytes Absolute: 4.3 10*3/uL — ABNORMAL HIGH (ref 0.1–1.0)
Monocytes Relative: 18 %
Neutro Abs: 17.9 10*3/uL — ABNORMAL HIGH (ref 1.7–7.7)
Neutrophils Relative %: 75 %
Platelets: 41 10*3/uL — ABNORMAL LOW (ref 150–400)
RBC: 3.8 MIL/uL — ABNORMAL LOW (ref 4.22–5.81)
RDW: 15.6 % — ABNORMAL HIGH (ref 11.5–15.5)
WBC: 23.7 10*3/uL — ABNORMAL HIGH (ref 4.0–10.5)
nRBC: 0 % (ref 0.0–0.2)

## 2021-09-06 LAB — URINE CULTURE: Culture: 1000 — AB

## 2021-09-06 MED ORDER — SERTRALINE HCL 50 MG PO TABS
75.0000 mg | ORAL_TABLET | Freq: Every day | ORAL | Status: DC
  Administered 2021-09-06 – 2021-09-10 (×5): 75 mg via ORAL
  Filled 2021-09-06 (×5): qty 1

## 2021-09-06 NOTE — Progress Notes (Signed)
Patient was asked if he would try to kill himself again and he says yes. Pt states "I wasn't successful the first time. I didn't have enough". "Everyone would be better off if I did". He states I'm getting to be a problem. I'm a burden not an asset. Suicide sitter assigned to his room at bedside.

## 2021-09-06 NOTE — Progress Notes (Signed)
Subjective: Patient reports improved abdominal pain. Urine is red but improving  Objective: Vital signs in last 24 hours: Temp:  [98 F (36.7 C)-98.6 F (37 C)] 98.2 F (36.8 C) (08/20 1301) Pulse Rate:  [98-104] 98 (08/20 1301) Resp:  [14-17] 14 (08/20 1301) BP: (105-111)/(54-62) 111/56 (08/20 1301) SpO2:  [95 %-96 %] 95 % (08/20 1301)  Intake/Output from previous day: 08/19 0701 - 08/20 0700 In: 1745.7 [P.O.:600; I.V.:845.7; IV Piggyback:300] Out: 1020 [Urine:1020] Intake/Output this shift: Total I/O In: 120 [P.O.:120] Out: -   Physical Exam:  General:alert, cooperative, and appears stated age GI: soft, non tender, normal bowel sounds, no palpable masses, no organomegaly, no inguinal hernia Male genitalia: not done Extremities: extremities normal, atraumatic, no cyanosis or edema  Lab Results: Recent Labs    09/04/21 1341 09/05/21 0901 09/06/21 0442  HGB 14.8 12.8* 10.5*  HCT 47.6 41.7 33.1*   BMET Recent Labs    09/05/21 0513 09/06/21 0442  NA 142 140  K 4.4 3.7  CL 112* 114*  CO2 25 23  GLUCOSE 149* 129*  BUN 17 18  CREATININE 1.30* 0.83  CALCIUM 8.3* 7.6*   No results for input(s): "LABPT", "INR" in the last 72 hours. No results for input(s): "LABURIN" in the last 72 hours. Results for orders placed or performed during the hospital encounter of 09/04/21  Culture, blood (routine x 2)     Status: None (Preliminary result)   Collection Time: 09/04/21 12:41 PM   Specimen: BLOOD  Result Value Ref Range Status   Specimen Description   Final    BLOOD LEFT ANTECUBITAL Performed at [facility] [address], [place], [place] [address]    Special Requests   Final    BOTTLES DRAWN AEROBIC AND ANAEROBIC Blood Culture adequate volume Performed at [facility] [address] [place], [place] [address]    Culture   Final    NO GROWTH 2 DAYS Performed at [facility] [address] [place], [place]  [address]    Report Status PENDING  Incomplete  Resp Panel by RT-PCR (Flu A&B, Covid)     Status: None   Collection Time: 09/04/21  1:46 PM   Specimen: Nasal Swab  Result Value Ref Range Status   SARS Coronavirus 2 by RT PCR NEGATIVE NEGATIVE Final    Comment: (NOTE) SARS-CoV-2 target nucleic acids are NOT DETECTED.  The SARS-CoV-2 RNA is generally detectable in upper respiratory specimens during the acute phase of infection. The lowest concentration of SARS-CoV-2 viral copies this assay can detect is 138 copies/mL. A negative result does not preclude SARS-Cov-2 infection and should not be used as the sole basis for treatment or other patient management decisions. A negative result may occur with  improper specimen collection/handling, submission of specimen other than nasopharyngeal swab, presence of viral mutation(s) within the areas targeted by this assay, and inadequate number of viral copies(<138 copies/mL). A negative result must be combined with clinical observations, patient history, and epidemiological information. The expected result is Negative.  Fact Sheet for Patients:  EntrepreneurPulse.com.au  Fact Sheet for Healthcare Providers:  IncredibleEmployment.be  This test is no t yet approved or cleared by the [place] FDA and  has been authorized for detection and/or diagnosis of SARS-CoV-2 by FDA under an Emergency Use Authorization (EUA). This EUA will remain  in effect (meaning this test can be used) for the duration of the COVID-19 declaration under Section 564(b)(1) of the Act, 21 U.S.C.section 360bbb-3(b)(1),  unless the authorization is terminated  or revoked sooner.       Influenza A by PCR NEGATIVE NEGATIVE Final   Influenza B by PCR NEGATIVE NEGATIVE Final    Comment: (NOTE) The Xpert Xpress SARS-CoV-2/FLU/RSV plus assay is intended as an aid in the diagnosis of influenza from Nasopharyngeal swab specimens and should  not be used as a sole basis for treatment. Nasal washings and aspirates are unacceptable for Xpert Xpress SARS-CoV-2/FLU/RSV testing.  Fact Sheet for Patients: EntrepreneurPulse.com.au  Fact Sheet for Healthcare Providers: IncredibleEmployment.be  This test is not yet approved or cleared by the [place] FDA and has been authorized for detection and/or diagnosis of SARS-CoV-2 by FDA under an Emergency Use Authorization (EUA). This EUA will remain in effect (meaning this test can be used) for the duration of the COVID-19 declaration under Section 564(b)(1) of the Act, 21 U.S.C. section 360bbb-3(b)(1), unless the authorization is terminated or revoked.  Performed at [facility], [place] [address] [place], [place] [address]   Culture, blood (routine x 2)     Status: None (Preliminary result)   Collection Time: 09/04/21  2:00 PM   Specimen: BLOOD  Result Value Ref Range Status   Specimen Description   Final    BLOOD SITE NOT SPECIFIED Performed at [facility] [address] [place], [place] [address]    Special Requests   Final    BOTTLES DRAWN AEROBIC AND ANAEROBIC Blood Culture results may not be optimal due to an excessive volume of blood received in culture bottles Performed at [facility] [address] [place], [place] [address]    Culture   Final    NO GROWTH 2 DAYS Performed at [facility] [address] [place], [place] [address]    Report Status PENDING  Incomplete  Urine Culture     Status: Abnormal   Collection Time: 09/04/21  5:03 PM   Specimen: Urine, Catheterized  Result Value Ref Range Status   Specimen Description   Final    URINE, CATHETERIZED Performed at [facility] [address] [place], [place] [address]    Special Requests   Final    NONE Performed at [facility], [place] [address], [place], [place]  [address]    Culture (A)  Final    1,000 COLONIES/mL STAPHYLOCOCCUS EPIDERMIDIS CALL MICROBIOLOGY LAB IF SENSITIVITIES ARE REQUIRED. Performed at [facility], [place] [address] [place], [place] [address]    Report Status 09/06/2021 FINAL  Final    Studies/Results: MR BRAIN WO CONTRAST  Result Date: 09/04/2021 CLINICAL DATA:  Mental status change EXAM: MRI HEAD WITHOUT CONTRAST TECHNIQUE: Multiplanar, multiecho pulse sequences of the brain and surrounding structures were obtained without intravenous contrast. COMPARISON:  No prior MRI, correlation is made with CT head 09/04/2021 FINDINGS: Brain: No restricted diffusion to suggest acute or subacute infarct. No acute hemorrhage, mass, mass effect, or midline shift. No hemosiderin deposition to suggest remote hemorrhage. Scattered and confluent T2 hyperintense signal in the periventricular white matter and pons, likely the sequela of moderate to severe chronic small vessel ischemic disease. Remote lacunar infarct in the left thalamus. Vascular: Normal arterial flow voids. Skull and upper cervical spine: Normal marrow signal. Sinuses/Orbits: No acute finding. Status post bilateral lens replacements. Other: The mastoids are well aerated. IMPRESSION: No acute intracranial process. No evidence of acute or subacute infarct. No etiology is seen for the patient's altered mental status Electronically Signed  By: Merilyn Baba M.D.   On: 09/04/2021 22:22    Assessment/Plan: 87yo with urinary retention and gross hematuria  Please continue indwelling foley. The foley should remain in place for 5-7 days prior to attempting a voiding trial Gross hematuria: improving   LOS: 1 day   Nicolette Bang 09/06/2021, 4:33 PM

## 2021-09-06 NOTE — Progress Notes (Signed)
PROGRESS NOTE    [name]  WNU:[id number] DOB: 1933-04-29 DOA: 09/04/2021 PCP: Doreene Nest, NP    Brief Narrative:   [name] is a 88 y.o. male with past medical history significant for HTN, HLD, CAD, history of choledocholithiasis, history of laparoscopic cholecystectomy, BPH, overactive bladder, OSA not on CPAP, cognitive impairment, anxiety/depression, Leukemia who presented to [facility] ED on 8/18 via EMS after patient was found with increased lethargy and confusion today by daughter.  Patient unable to answer questions and history obtained from both daughters present at bedside.  Patient currently lives alone but daughters place his daily medications out for him so there is no confusion.  Patient with recent hospitalizations for similar events recently, also reported previous hallucinations thought to be due to previous Remeron and trazodone use which has now been discontinued for roughly 1 week.  Daughter was also reported that patient did have access to a cough medicine that he may have taken.  They are unaware of any other sedating or narcotics available for him at home.  Only other new medication for patient is Ciprodex eardrops after having trauma to his left ear.   Received report from daughters that patient has had increased suicidal thoughts including stating "I am going to blow my brains out with a gun" when he was at a bank recently.  Patient's daughter also found empty Dilaudid container at home; which was apparently his wife's previous medication in which she has now passed 2 years ago from pancreatic cancer.  Concern for intentional overdose.   In the ED, temperature 97.6 F, HR 100, RR 12, BP 120/65, 92% on room air.  WBC 14.5, hemoglobin 14.8, platelets 56.  Sodium 140, potassium 4.0, chloride 109, CO2 25, BUN 11, creatinine 0.99.  Glucose 132.  LFTs/total bilirubin within normal limits.  Lactic acid 2.1.  BNP 196.8.  COVID-19 PCR negative.  Influenza A/B PCR  negative.  CT head without contrast with no acute intracranial abnormality, chronic small vessel ischemic disease and cerebral atrophy.  Chest x-ray with no acute cardiopulmonary disease process.  EDP consulted [facility] for admission for further evaluation and work-up of acute metabolic encephalopathy.  Assessment & Plan:   Acute metabolic encephalopathy 2/2 intentional opioid overdose Suicide attempt, continued ideations Patient presenting to the ED with recurrent confusion, lethargy and altered mental status.  Patient has been seen by his PCP and in the ED over the last few weeks with similar events.  Patient was recently stopped on Remeron and trazodone as likely contributing factors to his previous hallucinations/somnolence.  On arrival, patient was afebrile but with mild leukocytosis.  Chest x-ray unrevealing.  CT head without contrast negative for acute intracranial abnormality.  Given patient's pinpoint pupils, concern for narcotic induced somnolence.  Patient was given Narcan with immediate improvement of his mental status.  MR brain without contrast with no acute findings.  UDS positive for opiates.  Seen by psychiatry with recommendation of transfer to [facility] psych facility. --Psychiatry following, appreciate assistance --Continue suicide precautions, one-to-one sitter --Continue supportive care, IV fluid hydration, Narcan as needed. --Discussed with daughters need removal of all weapons including guns from household --Social work consulted for [employer] psych placement --If attempts to leave AMA, will need involuntary commitment as patient continues with suicidal ideations   Lactic acidosis: Resolved Lactic acid 2.1 on admission, likely secondary to dehydration in the setting of encephalopathy.  No focal infectious etiology found such far. --Lactic acid 2.1>1.6 --Continue IV fluid hydration   Leukocytosis  Aspiration pneumonia WBC count 14.5 on admission; now increased to 51.5.  Concern for  aspiration event following intentional overdose. --WBC 14.5>51.5>23.7 --Blood cultures x2: No growth x2 days --urine culture: Staph epidermidis --Ceftriaxone 2 g IV every 24 hours --Metronidazole 500 mg IV q12h --Continue IV fluid hydration --CBC daily  Acute urinary retention Hematuria BPH Hematuria likely secondary to traumatic Foley placement in the ED.  Attempted to discontinue Foley catheter with continued urinary retention and hematuria.  Urology consulted and patient underwent coud catheter placement by Dr. Ronne Binning on 09/05/2021. --Continue tamsulosin 0.4 mg p.o. daily --Started finasteride 5 mg p.o. daily --Continue Foley catheter, flush as needed for clots --Continue monitoring CBC daily; hemoglobin currently stable --Will likely need to discharge with Foley in place and outpatient follow-up with urology, Dr. Alvester Morin following discharge   Left otitis externa --Continue Ciprodex eardrops 3 times daily   Anxiety/depression:  --sertraline increased to 75 mg p.o. daily   CAD/HTN/HLD: Currently not on medication outpatient   DVT prophylaxis: SCDs Start: 09/04/21 1721    Code Status: DNR Family Communication: Updated daughter present at bedside this morning  Disposition Plan:  Level of care: Telemetry Status is: Inpatient Remains inpatient appropriate because: IV antibiotics, IV fluid hydration, will need [facility] psych placement   Consultants:  Psychiatry  Procedures:  None  Antimicrobials:  Ceftriaxone 8/19>> Metronidazole 8/19>>   Subjective: Patient seen examined bedside, resting comfortably.  Lying in bed.  More alert and oriented today.  Daughter present at bedside.  Per RN this morning, patient continues with suicidal ideations and not contracting for safety.  Seen by psychiatry yesterday with recommendations of [facility] psych placement.  Also due to acute urinary obstruction/hematuria, coud catheter placed by urology yesterday with improvement of symptoms.  Remains  on IV antibiotics.  Patient with no other complaints or concerns at this time.  Denies headache, no chest pain, no palpitations, no shortness of breath, no abdominal pain, no fever/chills/night sweats, no nausea/vomiting/diarrhea, no focal weakness, no fatigue, no paresthesias.  No acute events overnight per nursing staff.   Objective: Vitals:   09/05/21 0519 09/05/21 2008 09/06/21 0349 09/06/21 1301  BP: (!) 102/58 (!) 105/54 110/62 (!) 111/56  Pulse: (!) 104 (!) 104 100 98  Resp:  17 17 14   Temp: 98.7 F (37.1 C) 98.6 F (37 C) 98 F (36.7 C) 98.2 F (36.8 C)  TempSrc: Oral Oral Oral Oral  SpO2: 98% 96% 95% 95%    Intake/Output Summary (Last 24 hours) at 09/06/2021 1339 Last data filed at 09/06/2021 0830 Gross per 24 hour  Intake 1445.7 ml  Output 1000 ml  Net 445.7 ml   There were no vitals filed for this visit.  Examination:  Physical Exam: GEN: NAD, alert, oriented to place Ginette Otto), Person Actor), time  (2023), elderly appearance HEENT: NCAT, PERRL, EOMI, sclera clear, MMM PULM: CTAB w/o wheezes/crackles, normal respiratory effort, on room air CV: RRR w/o M/G/R GI: abd soft, NTND, NABS, no R/G/M GU: Foley catheter noted with blood in urine, no clots seen this a.m. MSK: no peripheral edema, muscle strength globally intact 5/5 bilateral upper/lower extremities NEURO: CN II-XII intact, no focal deficits, sensation to light touch intact PSYCH: Depressed mood, + continued suicidal ideations Integumentary: dry/intact, no rashes or wounds    Data Reviewed: I have personally reviewed following labs and imaging studies  CBC: Recent Labs  Lab 09/04/21 1341 09/05/21 0901 09/06/21 0442  WBC 14.5* 51.5* 23.7*  NEUTROABS 9.7*  --  17.9*  HGB 14.8  12.8* 10.5*  HCT 47.6 41.7 33.1*  MCV 87.2 88.7 87.1  PLT 56* 55* 41*   Basic Metabolic Panel: Recent Labs  Lab 09/04/21 1341 09/05/21 0513 09/06/21 0442  NA 140 142 140  K 4.0 4.4 3.7  CL 109 112* 114*   CO2 25 25 23   GLUCOSE 132* 149* 129*  BUN 11 17 18   CREATININE 0.99 1.30* 0.83  CALCIUM 8.4* 8.3* 7.6*  MG  --  1.9  --   PHOS  --  3.4  --    GFR: Estimated Creatinine Clearance: 46 mL/min (by C-G formula based on SCr of 0.83 mg/dL). Liver Function Tests: Recent Labs  Lab 09/04/21 1341  AST 15  ALT 13  ALKPHOS 76  BILITOT 0.8  PROT 5.9*  ALBUMIN 4.1   No results for input(s): "LIPASE", "AMYLASE" in the last 168 hours. No results for input(s): "AMMONIA" in the last 168 hours. Coagulation Profile: No results for input(s): "INR", "PROTIME" in the last 168 hours. Cardiac Enzymes: No results for input(s): "CKTOTAL", "CKMB", "CKMBINDEX", "TROPONINI" in the last 168 hours. BNP (last 3 results) No results for input(s): "PROBNP" in the last 8760 hours. HbA1C: No results for input(s): "HGBA1C" in the last 72 hours. CBG: Recent Labs  Lab 09/05/21 2007 09/06/21 0029 09/06/21 0348 09/06/21 0800 09/06/21 1209  GLUCAP 146* 136* 132* 113* 112*   Lipid Profile: No results for input(s): "CHOL", "HDL", "LDLCALC", "TRIG", "CHOLHDL", "LDLDIRECT" in the last 72 hours. Thyroid Function Tests: Recent Labs    09/05/21 0513  TSH 0.300*   Anemia Panel: No results for input(s): "VITAMINB12", "FOLATE", "FERRITIN", "TIBC", "IRON", "RETICCTPCT" in the last 72 hours. Sepsis Labs: Recent Labs  Lab 09/04/21 1342 09/04/21 1705 09/05/21 0513  LATICACIDVEN 2.1* 1.4 1.6    Recent Results (from the past 240 hour(s))  Urine Culture     Status: Abnormal   Collection Time: 08/29/21  6:20 AM   Specimen: Urine, Clean Catch  Result Value Ref Range Status   Specimen Description URINE, CLEAN CATCH  Final   Special Requests   Final    NONE Performed at [facility], [address] [place], [place] [address]    Culture MULTIPLE SPECIES PRESENT, SUGGEST RECOLLECTION (A)  Final   Report Status 08/30/2021 FINAL  Final  Culture, blood (routine x 2)     Status: None (Preliminary result)    Collection Time: 09/04/21 12:41 PM   Specimen: BLOOD  Result Value Ref Range Status   Specimen Description   Final    BLOOD LEFT ANTECUBITAL Performed at [facility], [address] [place], [place] [address]    Special Requests   Final    BOTTLES DRAWN AEROBIC AND ANAEROBIC Blood Culture adequate volume Performed at [facility], [address], [place], [place] [address]    Culture   Final    NO GROWTH 2 DAYS Performed at [facility], [address], [place], [place] [address]    Report Status PENDING  Incomplete  Resp Panel by RT-PCR (Flu A&B, Covid)     Status: None   Collection Time: 09/04/21  1:46 PM   Specimen: Nasal Swab  Result Value Ref Range Status   SARS Coronavirus 2 by RT PCR NEGATIVE NEGATIVE Final    Comment: (NOTE) SARS-CoV-2 target nucleic acids are NOT DETECTED.  The SARS-CoV-2 RNA is generally detectable in upper respiratory specimens during the acute phase of infection. The lowest concentration of SARS-CoV-2 viral copies this  assay can detect is 138 copies/mL. A negative result does not preclude SARS-Cov-2 infection and should not be used as the sole basis for treatment or other patient management decisions. A negative result may occur with  improper specimen collection/handling, submission of specimen other than nasopharyngeal swab, presence of viral mutation(s) within the areas targeted by this assay, and inadequate number of viral copies(<138 copies/mL). A negative result must be combined with clinical observations, patient history, and epidemiological information. The expected result is Negative.  Fact Sheet for Patients:  BloggerCourse.com  Fact Sheet for Healthcare Providers:  SeriousBroker.it  This test is no t yet approved or cleared by the [place] FDA and  has been authorized for detection and/or diagnosis of SARS-CoV-2 by FDA under an  Emergency Use Authorization (EUA). This EUA will remain  in effect (meaning this test can be used) for the duration of the COVID-19 declaration under Section 564(b)(1) of the Act, 21 U.S.C.section 360bbb-3(b)(1), unless the authorization is terminated  or revoked sooner.       Influenza A by PCR NEGATIVE NEGATIVE Final   Influenza B by PCR NEGATIVE NEGATIVE Final    Comment: (NOTE) The Xpert Xpress SARS-CoV-2/FLU/RSV plus assay is intended as an aid in the diagnosis of influenza from Nasopharyngeal swab specimens and should not be used as a sole basis for treatment. Nasal washings and aspirates are unacceptable for Xpert Xpress SARS-CoV-2/FLU/RSV testing.  Fact Sheet for Patients: BloggerCourse.com  Fact Sheet for Healthcare Providers: SeriousBroker.it  This test is not yet approved or cleared by the [place] FDA and has been authorized for detection and/or diagnosis of SARS-CoV-2 by FDA under an Emergency Use Authorization (EUA). This EUA will remain in effect (meaning this test can be used) for the duration of the COVID-19 declaration under Section 564(b)(1) of the Act, 21 U.S.C. section 360bbb-3(b)(1), unless the authorization is terminated or revoked.  Performed at [facility], [address], [place], [place] [address]   Culture, blood (routine x 2)     Status: None (Preliminary result)   Collection Time: 09/04/21  2:00 PM   Specimen: BLOOD  Result Value Ref Range Status   Specimen Description   Final    BLOOD SITE NOT SPECIFIED Performed at [facility], [address], [place], [place] [address]    Special Requests   Final    BOTTLES DRAWN AEROBIC AND ANAEROBIC Blood Culture results may not be optimal due to an excessive volume of blood received in culture bottles Performed at [facility], [address], [place], [place] [address]    Culture   Final     NO GROWTH 2 DAYS Performed at [facility], [address], [place], [place] [address]    Report Status PENDING  Incomplete  Urine Culture     Status: Abnormal   Collection Time: 09/04/21  5:03 PM   Specimen: Urine, Catheterized  Result Value Ref Range Status   Specimen Description   Final    URINE, CATHETERIZED Performed at [facility], [address] [place], [place] [address]    Special Requests   Final    NONE Performed at [facility], [address], [place], [place] [address]    Culture (A)  Final    1,000 COLONIES/mL STAPHYLOCOCCUS EPIDERMIDIS CALL MICROBIOLOGY LAB IF SENSITIVITIES ARE REQUIRED. Performed at [facility], [address] [place], [place] [address]    Report Status 09/06/2021 FINAL  Final  Radiology Studies: MR BRAIN WO CONTRAST  Result Date: 09/04/2021 CLINICAL DATA:  Mental status change EXAM: MRI HEAD WITHOUT CONTRAST TECHNIQUE: Multiplanar, multiecho pulse sequences of the brain and surrounding structures were obtained without intravenous contrast. COMPARISON:  No prior MRI, correlation is made with CT head 09/04/2021 FINDINGS: Brain: No restricted diffusion to suggest acute or subacute infarct. No acute hemorrhage, mass, mass effect, or midline shift. No hemosiderin deposition to suggest remote hemorrhage. Scattered and confluent T2 hyperintense signal in the periventricular white matter and pons, likely the sequela of moderate to severe chronic small vessel ischemic disease. Remote lacunar infarct in the left thalamus. Vascular: Normal arterial flow voids. Skull and upper cervical spine: Normal marrow signal. Sinuses/Orbits: No acute finding. Status post bilateral lens replacements. Other: The mastoids are well aerated. IMPRESSION: No acute intracranial process. No evidence of acute or subacute infarct. No etiology is seen for the patient's altered mental status Electronically Signed   By: Wiliam Ke M.D.   On: 09/04/2021 22:22   CT Head Wo Contrast  Result Date: 09/04/2021 CLINICAL DATA:  Mental status change, unknown cause EXAM: CT HEAD WITHOUT CONTRAST TECHNIQUE: Contiguous axial images were obtained from the base of the skull through the vertex without intravenous contrast. RADIATION DOSE REDUCTION: This exam was performed according to the departmental dose-optimization program which includes automated exposure control, adjustment of the mA and/or kV according to patient size and/or use of iterative reconstruction technique. COMPARISON:  Head CT 07/03/2021 FINDINGS: Brain: No evidence of acute infarction, hemorrhage, hydrocephalus, extra-axial collection or mass lesion/mass effect. The ventricles are unchanged in size.Confluent periventricular and subcortical white matter hypoattenuation, which is nonspecific but likely sequela of chronic small vessel ischemic disease.Mild cerebral atrophy Vascular: Vascular calcifications.  No hyperdense vessel. Skull: Negative for skull fracture. Sinuses/Orbits: Mild ethmoid air cell mucosal thickening. Orbits are unremarkable. Mastoid air cells are clear. Other: None. IMPRESSION: No acute intracranial abnormality. Chronic small vessel ischemic disease and cerebral atrophy. Electronically Signed   By: Caprice Renshaw M.D.   On: 09/04/2021 14:05   DG Chest Port 1 View  Result Date: 09/04/2021 CLINICAL DATA:  Cough, altered mental status EXAM: PORTABLE CHEST 1 VIEW COMPARISON:  07/03/2021 FINDINGS: Transverse diameter of heart is increased. There are no signs of pulmonary edema or focal pulmonary consolidation. There is no pleural effusion or pneumothorax. No significant interval changes are noted in the lung fields. IMPRESSION: No active disease. Electronically Signed   By: Ernie Avena M.D.   On: 09/04/2021 13:46        Scheduled Meds:  ciprofloxacin-dexamethasone  3 drop Left EAR TID   finasteride  5 mg Oral Daily   fluticasone  1 spray Each Nare  BID   loratadine  10 mg Oral Daily   sertraline  75 mg Oral Daily   tamsulosin  0.8 mg Oral Daily   Continuous Infusions:  sodium chloride 125 mL/hr at 09/06/21 0422   cefTRIAXone (ROCEPHIN)  IV 2 g (09/06/21 1204)   metronidazole 500 mg (09/05/21 2216)     LOS: 1 day    Time spent: 48 minutes spent on chart review, discussion with nursing staff, consultants, updating family and interview/physical exam; more than 50% of that time was spent in counseling and/or coordination of care.    Alvira Philips [place], DO [facility] Available via Epic secure chat 7am-7pm After these hours, please refer to coverage provider listed on [email or web address] 09/06/2021, 1:39 PM

## 2021-09-07 ENCOUNTER — Telehealth: Payer: Self-pay

## 2021-09-07 DIAGNOSIS — G928 Other toxic encephalopathy: Secondary | ICD-10-CM | POA: Diagnosis not present

## 2021-09-07 LAB — CBC WITH DIFFERENTIAL/PLATELET
Abs Immature Granulocytes: 0.71 10*3/uL — ABNORMAL HIGH (ref 0.00–0.07)
Basophils Absolute: 0.1 10*3/uL (ref 0.0–0.1)
Basophils Relative: 0 %
Eosinophils Absolute: 0 10*3/uL (ref 0.0–0.5)
Eosinophils Relative: 0 %
HCT: 33.7 % — ABNORMAL LOW (ref 39.0–52.0)
Hemoglobin: 10.5 g/dL — ABNORMAL LOW (ref 13.0–17.0)
Immature Granulocytes: 5 %
Lymphocytes Relative: 5 %
Lymphs Abs: 0.7 10*3/uL (ref 0.7–4.0)
MCH: 27.1 pg (ref 26.0–34.0)
MCHC: 31.2 g/dL (ref 30.0–36.0)
MCV: 86.9 fL (ref 80.0–100.0)
Monocytes Absolute: 2.7 10*3/uL — ABNORMAL HIGH (ref 0.1–1.0)
Monocytes Relative: 20 %
Neutro Abs: 9.5 10*3/uL — ABNORMAL HIGH (ref 1.7–7.7)
Neutrophils Relative %: 70 %
Platelets: 36 10*3/uL — ABNORMAL LOW (ref 150–400)
RBC: 3.88 MIL/uL — ABNORMAL LOW (ref 4.22–5.81)
RDW: 15.4 % (ref 11.5–15.5)
WBC: 13.8 10*3/uL — ABNORMAL HIGH (ref 4.0–10.5)
nRBC: 0 % (ref 0.0–0.2)

## 2021-09-07 LAB — BASIC METABOLIC PANEL
Anion gap: 2 — ABNORMAL LOW (ref 5–15)
BUN: 11 mg/dL (ref 8–23)
CO2: 21 mmol/L — ABNORMAL LOW (ref 22–32)
Calcium: 7.6 mg/dL — ABNORMAL LOW (ref 8.9–10.3)
Chloride: 120 mmol/L — ABNORMAL HIGH (ref 98–111)
Creatinine, Ser: 0.73 mg/dL (ref 0.61–1.24)
GFR, Estimated: >60 mL/min (ref >60)
Glucose, Bld: 101 mg/dL — ABNORMAL HIGH (ref 70–99)
Potassium: 3.5 mmol/L (ref 3.5–5.1)
Sodium: 143 mmol/L (ref 135–145)

## 2021-09-07 LAB — GLUCOSE, CAPILLARY
Glucose-Capillary: 102 mg/dL — ABNORMAL HIGH (ref 70–99)
Glucose-Capillary: 103 mg/dL — ABNORMAL HIGH (ref 70–99)
Glucose-Capillary: 103 mg/dL — ABNORMAL HIGH (ref 70–99)
Glucose-Capillary: 108 mg/dL — ABNORMAL HIGH (ref 70–99)
Glucose-Capillary: 132 mg/dL — ABNORMAL HIGH (ref 70–99)
Glucose-Capillary: 92 mg/dL (ref 70–99)

## 2021-09-07 MED ORDER — GABAPENTIN 100 MG PO CAPS
100.0000 mg | ORAL_CAPSULE | Freq: Two times a day (BID) | ORAL | Status: DC
  Administered 2021-09-07 – 2021-09-17 (×20): 100 mg via ORAL
  Filled 2021-09-07 (×20): qty 1

## 2021-09-07 MED ORDER — BOOST / RESOURCE BREEZE PO LIQD CUSTOM
1.0000 | Freq: Three times a day (TID) | ORAL | Status: DC
Start: 2021-09-07 — End: 2021-09-17
  Administered 2021-09-07 – 2021-09-17 (×22): 1 via ORAL

## 2021-09-07 MED ORDER — CHLORHEXIDINE GLUCONATE CLOTH 2 % EX PADS
6.0000 | MEDICATED_PAD | Freq: Every day | CUTANEOUS | Status: DC
  Administered 2021-09-07 – 2021-09-13 (×6): 6 via TOPICAL

## 2021-09-07 MED ORDER — POLYVINYL ALCOHOL 1.4 % OP SOLN
1.0000 [drp] | OPHTHALMIC | Status: DC | PRN
  Administered 2021-09-07 – 2021-09-16 (×3): 1 [drp] via OPHTHALMIC
  Filled 2021-09-07: qty 15

## 2021-09-07 NOTE — TOC Progression Note (Addendum)
Transition of Care [facility]) - Progression Note    Patient Details  Name: [name] MRN: [id number] Date of Birth: 1933-06-28  [facility] [place] CM/SW Contact  Leeroy Cha, RN Phone Number: 09/07/2021, 1:53 PM  Clinical Narrative:    Ivc papers faxed to magistrate and rec'd back 4 copies made and placed in ivc folder with chart.   [id number] nonemergency police called to have patient served with ivc papers.       Expected Discharge Plan and Services                                                 Social Determinants of Health (SDOH) Interventions    Readmission Risk Interventions     No data to display

## 2021-09-07 NOTE — Consult Note (Cosign Needed Addendum)
[facility] Face-to-Face Psychiatry Consult   Reason for Consult: ? Concern for intentional overdose/suicide 88 year old male admitted with encephalopathy secondary to presumed intentional opioid overdose. Family found empty bottle of Dilaudid.'' Referring Physician:  [place] Eric, DO Patient Identification: [name] MRN:  [id number] Principal Diagnosis: Toxic metabolic encephalopathy Diagnosis:  Principal Problem:   Toxic metabolic encephalopathy Active Problems:   HLD (hyperlipidemia)   Essential hypertension   Coronary atherosclerosis   BPH (benign prostatic hyperplasia)   Drug overdose, intentional, initial encounter ([place])   Acute metabolic encephalopathy   Major depressive disorder, single episode, severe ([place])   Total Time spent with patient: 30 minutes  Subjective:   [name] is a 88 y.o. male patient admitted with intentional overdose.  HPI:  [name] is a 88 y.o. male with past medical history significant for HTN, HLD, CAD, history of choledocholithiasis, history of laparoscopic cholecystectomy, BPH, overactive bladder, OSA not on CPAP, cognitive impairment, Anxiety/depression, Leukemia who presented to [facility] ED on 8/18 via EMS after patient was found with increased lethargy and confusion by his daughter.   Currently on interview, the patient. Alert and oriented x3. Patient becomes tearful at times during interview, and evasive when specifically discussing suicidal thoughts and impacts to family members.  Patient does admit to overdosing on pills and prescription medication in an attempt to end his life.  Patient continues to repeat "long live life, I will be 88 in a couple weeks.  I have lived long enough.  "While patient does not provide much information regarding mood prior to suicide attempt, he does endorse some depression, anxiety, hopelessness, worthlessness, and guilt.  He further reports isolation and withdrawn, low energy, crying spells and recurrent thoughts of  death.  He has felt this way since the loss of his wife of 62 years.  Patient became a widow 8 years ago.  He denies having manic symptoms including irritability, extended insomnia, racing thoughts, and impulsivity. Recommend inpatient psychiatric hospitalization for crisis stabilization to address symptoms of depression, anxiety, and suicidal ideations.  He is in agreement to continue current medication, plan has been discussed with both daughters who are also at the bedside.  All questions, comments, concerns, have been addressed and both verbalized understanding.  Past Psychiatric History: depression  Risk to Self:  yes, unable to contract for safety Risk to Others:  not known Prior Inpatient Therapy:  none Prior Outpatient Therapy:  none, receives Zoloft from PCP  Past Medical History:  Past Medical History:  Diagnosis Date   Arthralgia of multiple joints    limited mobility w/  independant adl's   BPH with obstruction/lower urinary tract symptoms    Chronic idiopathic monocytosis    followed by dr Waymon Budge--  per lov note 06/ 2017 persistant unexplained  with normal bone barrow bx   Coronary atherosclerosis of unspecified type of vessel, native or graft    cardiologist-  dr Stanford Breed -- per lov note 11-19-2014 ,  cardiac cath in 2000-- pLAD 60-70%,  small intermediate branch 70-80%,  mRCA 20%,  normal LV   Degenerative arthritis of spine    cervical and lumar   Diverticulosis of colon (without mention of hemorrhage)    Dysuria    chronic   Elevated PSA    Feeling of incomplete bladder emptying    Hiatal hernia    moderate per ct 11/ 2017   History of adenomatous polyp of colon    2008   History of atrial fibrillation    remote hx  episode   History of COVID-19 03/05/2020   History of pancreatitis    07-01-2013  and 10-10-2015   History of prostatitis    2014   Hx of colonic polyps    Hyperlipidemia    Hypertension    Mouth sore    roof of mouth sore    Nephrolithiasis     Nocturia more than twice per night    severe  w/ leakage   OSA (obstructive sleep apnea)    INTOLERANT CPAP   Osteoarthritis    Pernicious anemia    B12  Def.   Peyronie disease    Renal insufficiency    Sepsis ([place]) 03/22/2017   Thrombocytopenia, unspecified ([place]) hemotology/oncologist-  dr Waymon Budge   per dr Velta Addison note 06/ 2016  secondary to vitro clumping   Vitamin B deficiency     Past Surgical History:  Procedure Laterality Date   BONE MARROW BIOPSY  2011   normal   CARDIAC CATHETERIZATION  2000   per dr Kathyrn Drown note --  dLAD 60-70%,  small intermediate branch 70-80%,  mRCA 20%,  normal LV   CARDIOVASCULAR STRESS TEST  01-29-2013   dr Stanford Breed   normal nuclear study w/ no ischemia,  normal LV function and wall motion , ef 82%   CATARACT EXTRACTION W/ INTRAOCULAR LENS  IMPLANT, BILATERAL  08/2015   CHOLECYSTECTOMY  11/24/2010   Procedure: LAPAROSCOPIC CHOLECYSTECTOMY WITH INTRAOPERATIVE CHOLANGIOGRAM;  Surgeon: Earnstine Regal, MD;  Location: [facility];  Service: General;  Laterality: N/A;  c-arm   CYSTOSCOPY W/ URETERAL STENT PLACEMENT Left 12/17/2015   Procedure: CYSTOSCOPY WITH RETROGRADE PYELOGRAM/URETERAL STENT PLACEMENT;  Surgeon: Ardis Hughs, MD;  Location: [facility];  Service: Urology;  Laterality: Left;   CYSTOSCOPY WITH RETROGRADE PYELOGRAM, URETEROSCOPY AND STENT PLACEMENT Left 12/22/2015   Procedure: CYSTOSCOPY WITH LEFT RETROGRADE  URETEROSCOPY AND STENT PLACEMENT;  Surgeon: Carolan Clines, MD;  Location: [place];  Service: Urology;  Laterality: Left;   ERCP N/A 07/04/2013   Procedure: ENDOSCOPIC RETROGRADE CHOLANGIOPANCREATOGRAPHY (ERCP);  Surgeon: Gatha Mayer, MD;  Location: [facility];  Service: Endoscopy;  Laterality: N/A;  MAC if available   ERCP N/A 10/12/2015   Procedure: ENDOSCOPIC RETROGRADE CHOLANGIOPANCREATOGRAPHY (ERCP);  Surgeon: [name], MD;  Location: [facility];  Service: Endoscopy;  Laterality: N/A;    HOLMIUM LASER APPLICATION Left 96/02/2295   Procedure: HOLMIUM LASER APPLICATION;  Surgeon: Carolan Clines, MD;  Location: [facility];  Service: Urology;  Laterality: Left;   INGUINAL HERNIA REPAIR Right 01/25/2002   KNEE ARTHROSCOPY  x5   SATURATION BIOPSY OF PROSTATE  05-29-2007  and 01-26-2008   SHOULDER ARTHROSCOPY WITH OPEN ROTATOR CUFF REPAIR AND DISTAL CLAVICLE ACROMINECTOMY Right 11/19/2004   TOTAL HIP ARTHROPLASTY Right 05/21/2009   TOTAL KNEE ARTHROPLASTY Bilateral left 05-02-2003/  right  03-23-2010   TRANSTHORACIC ECHOCARDIOGRAM  12/23/2004   ef 60%, mild MV calcification without stenosis/  mild TR,  PASP 74mHg   UVULOPALATOPHARYNGOPLASTY  1993    w/  T & A   Family History:  Family History  Problem Relation Age of Onset   Peripheral vascular disease Mother    Colon cancer Father 466  Breast cancer Daughter    Breast cancer Daughter    Family Psychiatric  History:  Social History:  Social History   Substance and Sexual Activity  Alcohol Use No   Alcohol/week: 0.0 standard drinks of alcohol     Social History   Substance and  Sexual Activity  Drug Use No    Social History   Socioeconomic History   Marital status: Widowed    Spouse name: Not on file   Number of children: 2   Years of education: Not on file   Highest education level: Not on file  Occupational History   Occupation: retired    Fish farm manager: RETIRED  Tobacco Use   Smoking status: Former    Types: Pipe    Quit date: 01/18/1966    Years since quitting: 55.6   Smokeless tobacco: Never  Vaping Use   Vaping Use: Never used  Substance and Sexual Activity   Alcohol use: No    Alcohol/week: 0.0 standard drinks of alcohol   Drug use: No   Sexual activity: Not Currently  Other Topics Concern   Not on file  Social History Narrative   DNR   Widowed   2 daughters leave near by   Social Determinants of Health   Financial Resource Strain: Low Risk  (08/21/2021)   Overall Financial  Resource Strain (CARDIA)    Difficulty of Paying Living Expenses: Not hard at all  Food Insecurity: No Food Insecurity (08/21/2021)   Hunger Vital Sign    Worried About Running Out of Food in the Last Year: Never true    [place] in the Last Year: Never true  Transportation Needs: No Transportation Needs (08/21/2021)   PRAPARE - Hydrologist (Medical): No    Lack of Transportation (Non-Medical): No  Physical Activity: Inactive (08/21/2021)   Exercise Vital Sign    Days of Exercise per Week: 0 days    Minutes of Exercise per Session: 0 min  Stress: Stress Concern Present (08/21/2021)   [place]    Feeling of Stress : To some extent  Social Connections: Not on file   Additional Social History:    Allergies:   Allergies  Allergen Reactions   Penicillins Hives and Other (See Comments)    [place], passed out Tolerates cephalosporins  Has patient had a PCN reaction causing immediate rash, facial/tongue/throat swelling, SOB or lightheadedness with hypotension:  yes Has patient had a PCN reaction causing severe rash involving mucus membranes or skin necrosis: no Has patient had a PCN reaction that required hospitalization: no Has patient had a PCN reaction occurring within the last 10 years: no If all of the above answers are "NO", then may proceed with Cephalosporin use.    Tape Hives and Other (See Comments)    Paper Tape    Labs:  Results for orders placed or performed during the hospital encounter of 09/04/21 (from the past 48 hour(s))  Glucose, capillary     Status: Abnormal   Collection Time: 09/05/21  8:07 PM  Result Value Ref Range   Glucose-Capillary 146 (H) 70 - 99 mg/dL    Comment: Glucose reference range applies only to samples taken after fasting for at least 8 hours.   Comment 1 Notify RN    Comment 2 Document in Chart   Glucose, capillary     Status: Abnormal    Collection Time: 09/06/21 12:29 AM  Result Value Ref Range   Glucose-Capillary 136 (H) 70 - 99 mg/dL    Comment: Glucose reference range applies only to samples taken after fasting for at least 8 hours.  Glucose, capillary     Status: Abnormal   Collection Time: 09/06/21  3:48 AM  Result Value Ref Range  Glucose-Capillary 132 (H) 70 - 99 mg/dL    Comment: Glucose reference range applies only to samples taken after fasting for at least 8 hours.   Comment 1 Notify RN    Comment 2 Document in Chart   CBC with Differential/Platelet     Status: Abnormal   Collection Time: 09/06/21  4:42 AM  Result Value Ref Range   WBC 23.7 (H) 4.0 - 10.5 K/uL   RBC 3.80 (L) 4.22 - 5.81 MIL/uL   Hemoglobin 10.5 (L) 13.0 - 17.0 g/dL   HCT 33.1 (L) 39.0 - 52.0 %   MCV 87.1 80.0 - 100.0 fL   MCH 27.6 26.0 - 34.0 pg   MCHC 31.7 30.0 - 36.0 g/dL   RDW 15.6 (H) 11.5 - 15.5 %   Platelets 41 (L) 150 - 400 K/uL    Comment: Immature Platelet Fraction may be clinically indicated, consider ordering this additional test [id number] CONSISTENT WITH PREVIOUS RESULT REPEATED TO VERIFY    nRBC 0.0 0.0 - 0.2 %   Neutrophils Relative % 75 %   Neutro Abs 17.9 (H) 1.7 - 7.7 K/uL   Lymphocytes Relative 4 %   Lymphs Abs 0.9 0.7 - 4.0 K/uL   Monocytes Relative 18 %   Monocytes Absolute 4.3 (H) 0.1 - 1.0 K/uL   Eosinophils Relative 0 %   Eosinophils Absolute 0.0 0.0 - 0.5 K/uL   Basophils Relative 0 %   Basophils Absolute 0.0 0.0 - 0.1 K/uL   Immature Granulocytes 3 %   Abs Immature Granulocytes 0.63 (H) 0.00 - 0.07 K/uL    Comment: Performed at [facility], [place] [address] [place], [place] [address]  Basic metabolic panel     Status: Abnormal   Collection Time: 09/06/21  4:42 AM  Result Value Ref Range   Sodium 140 135 - 145 mmol/L   Potassium 3.7 3.5 - 5.1 mmol/L   Chloride 114 (H) 98 - 111 mmol/L   CO2 23 22 - 32 mmol/L   Glucose, Bld 129 (H) 70 - 99 mg/dL    Comment: Glucose reference  range applies only to samples taken after fasting for at least 8 hours.   BUN 18 8 - 23 mg/dL   Creatinine, Ser 0.83 0.61 - 1.24 mg/dL   Calcium 7.6 (L) 8.9 - 10.3 mg/dL   GFR, Estimated >60 >60 mL/min    Comment: (NOTE) Calculated using the CKD-EPI Creatinine Equation (2021)    Anion gap 3 (L) 5 - 15    Comment: Performed at [facility], [place] [address], [place],  [address]  Glucose, capillary     Status: Abnormal   Collection Time: 09/06/21  8:00 AM  Result Value Ref Range   Glucose-Capillary 113 (H) 70 - 99 mg/dL    Comment: Glucose reference range applies only to samples taken after fasting for at least 8 hours.   Comment 1 Notify RN   Glucose, capillary     Status: Abnormal   Collection Time: 09/06/21 12:09 PM  Result Value Ref Range   Glucose-Capillary 112 (H) 70 - 99 mg/dL    Comment: Glucose reference range applies only to samples taken after fasting for at least 8 hours.  Glucose, capillary     Status: None   Collection Time: 09/06/21  4:33 PM  Result Value Ref Range   Glucose-Capillary 97 70 - 99 mg/dL    Comment: Glucose reference range applies only to samples taken after fasting for at least 8 hours.  Glucose, capillary     Status: Abnormal   Collection Time: 09/06/21  7:31 PM  Result Value Ref Range   Glucose-Capillary 126 (H) 70 - 99 mg/dL    Comment: Glucose reference range applies only to samples taken after fasting for at least 8 hours.  Glucose, capillary     Status: Abnormal   Collection Time: 09/07/21 12:09 AM  Result Value Ref Range   Glucose-Capillary 103 (H) 70 - 99 mg/dL    Comment: Glucose reference range applies only to samples taken after fasting for at least 8 hours.  CBC with Differential/Platelet     Status: Abnormal   Collection Time: 09/07/21  4:46 AM  Result Value Ref Range   WBC 13.8 (H) 4.0 - 10.5 K/uL   RBC 3.88 (L) 4.22 - 5.81 MIL/uL   Hemoglobin 10.5 (L) 13.0 - 17.0 g/dL   HCT 33.7 (L) 39.0 - 52.0 %   MCV 86.9  80.0 - 100.0 fL   MCH 27.1 26.0 - 34.0 pg   MCHC 31.2 30.0 - 36.0 g/dL   RDW 15.4 11.5 - 15.5 %   Platelets 36 (L) 150 - 400 K/uL    Comment: Immature Platelet Fraction may be clinically indicated, consider ordering this additional test [id number]    nRBC 0.0 0.0 - 0.2 %   Neutrophils Relative % 70 %   Neutro Abs 9.5 (H) 1.7 - 7.7 K/uL   Lymphocytes Relative 5 %   Lymphs Abs 0.7 0.7 - 4.0 K/uL   Monocytes Relative 20 %   Monocytes Absolute 2.7 (H) 0.1 - 1.0 K/uL   Eosinophils Relative 0 %   Eosinophils Absolute 0.0 0.0 - 0.5 K/uL   Basophils Relative 0 %   Basophils Absolute 0.1 0.0 - 0.1 K/uL   Immature Granulocytes 5 %   Abs Immature Granulocytes 0.71 (H) 0.00 - 0.07 K/uL    Comment: Performed at [facility], [place] [address] [place], [place] [address]  Basic metabolic panel     Status: Abnormal   Collection Time: 09/07/21  4:46 AM  Result Value Ref Range   Sodium 143 135 - 145 mmol/L    Comment: RESULTS VERIFIED BY REPEAT TESTING   Potassium 3.5 3.5 - 5.1 mmol/L   Chloride 120 (H) 98 - 111 mmol/L    Comment: RESULTS VERIFIED BY REPEAT TESTING   CO2 21 (L) 22 - 32 mmol/L    Comment: RESULTS VERIFIED BY REPEAT TESTING   Glucose, Bld 101 (H) 70 - 99 mg/dL    Comment: Glucose reference range applies only to samples taken after fasting for at least 8 hours.   BUN 11 8 - 23 mg/dL   Creatinine, Ser 0.73 0.61 - 1.24 mg/dL   Calcium 7.6 (L) 8.9 - 10.3 mg/dL   GFR, Estimated >60 >60 mL/min    Comment: (NOTE) Calculated using the CKD-EPI Creatinine Equation (2021)    Anion gap 2 (L) 5 - 15    Comment: Performed at [facility], [place] [address], [place], [place] [address]  Glucose, capillary     Status: Abnormal   Collection Time: 09/07/21  5:09 AM  Result Value Ref Range   Glucose-Capillary 108 (H) 70 - 99 mg/dL    Comment: Glucose reference range applies only to samples taken after fasting for at least 8 hours.  Glucose, capillary      Status: Abnormal   Collection Time: 09/07/21  8:47 AM  Result Value Ref Range   Glucose-Capillary 103 (H)  70 - 99 mg/dL    Comment: Glucose reference range applies only to samples taken after fasting for at least 8 hours.  Glucose, capillary     Status: Abnormal   Collection Time: 09/07/21 11:50 AM  Result Value Ref Range   Glucose-Capillary 102 (H) 70 - 99 mg/dL    Comment: Glucose reference range applies only to samples taken after fasting for at least 8 hours.    Current Facility-Administered Medications  Medication Dose Route Frequency Provider Last Rate Last Admin   acetaminophen (TYLENOL) tablet 650 mg  650 mg Oral Q6H PRN [place] Donnamarie Poag, DO       Or   acetaminophen (TYLENOL) suppository 650 mg  650 mg Rectal Q6H PRN [place] Eric J, DO   650 mg at 09/04/21 2313   albuterol (PROVENTIL) (2.5 MG/3ML) 0.083% nebulizer solution 2.5 mg  2.5 mg Nebulization Q2H PRN [place] Eric J, DO       cefTRIAXone (ROCEPHIN) 2 g in sodium chloride 0.9 % 100 mL IVPB  2 g Intravenous Q24H [place] Donnamarie Poag, DO 200 mL/hr at 09/07/21 1023 2 g at 09/07/21 1023   Chlorhexidine Gluconate Cloth 2 % PADS 6 each  6 each Topical Daily [place] Eric J, DO   6 each at 09/07/21 1021   ciprofloxacin-dexamethasone (CIPRODEX) 0.3-0.1 % OTIC (EAR) suspension 3 drop  3 drop Left EAR TID Dimple Nanas, RPH   3 drop at 09/07/21 1021   finasteride (PROSCAR) tablet 5 mg  5 mg Oral Daily [place] Donnamarie Poag, DO   5 mg at 09/07/21 1019   fluticasone (FLONASE) 50 MCG/ACT nasal spray 1 spray  1 spray Each Nare BID [place] Eric J, DO   1 spray at 09/07/21 1021   loratadine (CLARITIN) tablet 10 mg  10 mg Oral Daily [place] Donnamarie Poag, DO   10 mg at 09/07/21 1020   metroNIDAZOLE (FLAGYL) IVPB 500 mg  500 mg Intravenous Q12H [place] Eric J, DO 100 mL/hr at 09/07/21 1315 500 mg at 09/07/21 1315   naloxone (NARCAN) injection 0.4 mg  0.4 mg Intravenous PRN [place] Donnamarie Poag, DO       ondansetron [facility]) tablet 4 mg  4 mg Oral Q6H PRN  [place] Eric J, DO       Or   ondansetron [facility]) injection 4 mg  4 mg Intravenous Q6H PRN [place] Eric J, DO   4 mg at 09/06/21 1906   polyethylene glycol (MIRALAX / GLYCOLAX) packet 17 g  17 g Oral Daily PRN [place] Eric J, DO       sertraline (ZOLOFT) tablet 75 mg  75 mg Oral Daily [place] Eric J, DO   75 mg at 09/07/21 1020   tamsulosin (FLOMAX) capsule 0.8 mg  0.8 mg Oral Daily [place] Donnamarie Poag, DO   0.8 mg at 09/07/21 1019    Musculoskeletal: Strength & Muscle Tone: within normal limits Gait & Station: unsteady Patient leans: N/A    Psychiatric Specialty Exam:  Presentation  General Appearance: Appropriate for Environment  Eye Contact:Minimal  Speech:Slow  Speech Volume:Decreased  Handedness:Right   Mood and Affect  Mood:Dysphoric  Affect:Constricted   Thought Process  Thought Processes:-- (unable to access due to cognitive limitation)  Descriptions of Associations:Intact  Orientation:-- (only to person and place not to time)  Thought Content:-- (unable to assess due to cognitive limitation)  History of Schizophrenia/Schizoaffective disorder:No data recorded Duration of Psychotic Symptoms:No data recorded Hallucinations:No data recorded  Ideas of Reference:None  Suicidal Thoughts:No data recorded  Homicidal Thoughts:No data recorded   Sensorium  Memory:Immediate Fair; Recent Poor; Remote Fair  Judgment:Poor  Insight:Poor   Executive Functions  Concentration:Fair  Attention Span:Fair  Recall:Poor  Fund of Knowledge:Poor  Language:Good   Psychomotor Activity  Psychomotor Activity:No data recorded   Assets  Assets:Social Support   Sleep  Sleep:No data recorded   Physical Exam: Physical Exam Review of Systems  Psychiatric/Behavioral:  Positive for depression and suicidal ideas. The patient is nervous/anxious.    Blood pressure 127/73, pulse (!) 101, temperature 98.1 F (36.7 C), temperature source Oral, resp. rate 18,  height 5' (1.524 m), weight 59.2 kg, SpO2 95 %. Body mass index is 25.49 kg/m.  Treatment Plan Summary: 88 year old male with multiple medical problems with worsening depressive symptoms. He was admitted to the hospital after he intentionally attempted suicide by overdosing on medication. Today, patient is unable to contract for safety, he will benefit from psychiatric inpatient admission after he is medically stabilized.  Plan/Recommendations: -Continue 1:1 sitter for safety -Continue Zoloft 75 mg p.o. daily for depression and suicidal thoughts. -Will start gabapentin 100 mg p.o. twice daily for anxiety, mood, and GABA related effects to include pain management. -Consider social worker consult to facilitate geriatric psychiatric inpatient admission after pt is medically stabilized. Continue IVC, as patient remains high risk for suicide completion, inability to contract for safety, and endorses ongoing active suicidal ideations. -Recommend palliative medicine consult to establish goals of care -Initiate delirium precautions.   Disposition: Recommend psychiatric Inpatient admission when medically cleared. Supportive therapy provided about ongoing stressors. Psychiatric service will follow the patient in order to monitor his progress  Suella Broad, FNP 09/07/2021 2:52 PM

## 2021-09-07 NOTE — Telephone Encounter (Signed)
Approved.  

## 2021-09-07 NOTE — Progress Notes (Signed)
Mobility Specialist - Progress Note     09/07/21 1126  Mobility  Activity Transferred to/from [facility]  Level of Assistance Minimal assist, patient does 75% or more  Assistive Device Front wheel walker  Distance Ambulated (ft) 10 ft  Activity Response Tolerated well  $Mobility charge 1 Mobility   Pt was found in bed and agreeable to mobilize. Pt was left with sitter on EOB after BSC use was done. Was left with all necessities within reach.  Ferd Hibbs Mobility Specialist

## 2021-09-07 NOTE — Telephone Encounter (Signed)
Home Health verbal orders  Agency Name: [facility]  Requesting social work   Reason: Has a visit scheduled next week   Please forward to [facility] pool or providers CMA

## 2021-09-07 NOTE — Progress Notes (Signed)
PROGRESS NOTE    [name]  WGN:[id number] DOB: 1933-09-01 DOA: 09/04/2021 PCP: Doreene Nest, NP    Brief Narrative:   [name] is a 88 y.o. male with past medical history significant for HTN, HLD, CAD, history of choledocholithiasis, history of laparoscopic cholecystectomy, BPH, overactive bladder, OSA not on CPAP, cognitive impairment, anxiety/depression, Leukemia who presented to [facility] ED on 8/18 via EMS after patient was found with increased lethargy and confusion today by daughter.  Patient unable to answer questions and history obtained from both daughters present at bedside.  Patient currently lives alone but daughters place his daily medications out for him so there is no confusion.  Patient with recent hospitalizations for similar events recently, also reported previous hallucinations thought to be due to previous Remeron and trazodone use which has now been discontinued for roughly 1 week.  Daughter was also reported that patient did have access to a cough medicine that he may have taken.  They are unaware of any other sedating or narcotics available for him at home.  Only other new medication for patient is Ciprodex eardrops after having trauma to his left ear.   Received report from daughters that patient has had increased suicidal thoughts including stating "I am going to blow my brains out with a gun" when he was at a bank recently.  Patient's daughter also found empty Dilaudid container at home; which was apparently his wife's previous medication in which she has now passed 2 years ago from pancreatic cancer.  Concern for intentional overdose.   In the ED, temperature 97.6 F, HR 100, RR 12, BP 120/65, 92% on room air.  WBC 14.5, hemoglobin 14.8, platelets 56.  Sodium 140, potassium 4.0, chloride 109, CO2 25, BUN 11, creatinine 0.99.  Glucose 132.  LFTs/total bilirubin within normal limits.  Lactic acid 2.1.  BNP 196.8.  COVID-19 PCR negative.  Influenza A/B PCR  negative.  CT head without contrast with no acute intracranial abnormality, chronic small vessel ischemic disease and cerebral atrophy.  Chest x-ray with no acute cardiopulmonary disease process.  EDP consulted [facility] for admission for further evaluation and work-up of acute metabolic encephalopathy.  Assessment & Plan:   Acute metabolic encephalopathy 2/2 intentional opioid overdose Suicide attempt, continued ideations Patient presenting to the ED with recurrent confusion, lethargy and altered mental status.  Patient has been seen by his PCP and in the ED over the last few weeks with similar events.  Patient was recently stopped on Remeron and trazodone as likely contributing factors to his previous hallucinations/somnolence.  On arrival, patient was afebrile but with mild leukocytosis.  Chest x-ray unrevealing.  CT head without contrast negative for acute intracranial abnormality.  Given patient's pinpoint pupils, concern for narcotic induced somnolence.  Patient was given Narcan with immediate improvement of his mental status.  MR brain without contrast with no acute findings.  UDS positive for opiates.  Seen by psychiatry with recommendation of transfer to [facility] psych facility. --Psychiatry following, appreciate assistance --Continue suicide precautions, one-to-one sitter, under involuntary commitment --Continue supportive care, IV fluid hydration, Narcan as needed. --Discussed with daughters need removal of all weapons including guns from household --Social work consulted for [employer] psych placement   Lactic acidosis: Resolved Lactic acid 2.1 on admission, likely secondary to dehydration in the setting of encephalopathy.  No focal infectious etiology found such far. --Lactic acid 2.1>1.6 --Continue IV fluid hydration   Leukocytosis Aspiration pneumonia WBC count 14.5 on admission; now increased to 51.5.  Concern for aspiration event following intentional overdose. --WBC  14.5>51.5>23.7>13.8 --Blood cultures x2: No growth x 3 days --urine culture: Staph epidermidis, likely contaminant --Ceftriaxone 2 g IV every 24 hours --Metronidazole 500 mg IV q12h --Continue IV fluid hydration --CBC daily  Acute urinary retention Hematuria BPH Hematuria likely secondary to traumatic Foley placement in the ED.  Attempted to discontinue Foley catheter with continued urinary retention and hematuria.  Urology consulted and patient underwent coud catheter placement by Dr. Ronne Binning on 09/05/2021. --Continue tamsulosin 0.4 mg p.o. daily --Started finasteride 5 mg p.o. daily --Continue Foley catheter, flush as needed for clots --Continue monitoring CBC daily; hemoglobin currently stable --Will likely need to discharge with Foley in place and outpatient follow-up with urology, Dr. Alvester Morin following discharge   Left otitis externa --Continue Ciprodex eardrops 3 times daily   Anxiety/depression:  --sertraline increased to 75 mg p.o. daily   CAD/HTN/HLD: Currently not on medication outpatient   DVT prophylaxis: SCDs Start: 09/04/21 1721    Code Status: DNR Family Communication: Updated daughter present at bedside this morning  Disposition Plan:  Level of care: Telemetry Status is: Inpatient Remains inpatient appropriate because: IV antibiotics, will need Geri psych placement   Consultants:  Psychiatry Urology  Procedures:  None  Antimicrobials:  Ceftriaxone 8/19>> Metronidazole 8/19>>   Subjective: Patient seen examined bedside, resting comfortably.  Lying in bed.  Daughter present.  Hematuria improving slowly.  Patient with no complaints this morning.  Daughter concerned that her sister who is the POA is unwilling for their father to go to inpatient psych even though he continues with suicidal ideations with plans.  This is highly concerning given patient's attempted suicide attempt at home which led to his hospitalization.  Psychiatry also recommending  inpatient psych.  Given that his POA would like to take patient home in which he lives alone we will initiate IVC as this is a very unsafe plan with high likelihood of him succeeding with suicide if he were to return home alone.  Patient with no other complaints or concerns at this time.  Denies headache, no chest pain, no palpitations, no shortness of breath, no abdominal pain, no fever/chills/night sweats, no nausea/vomiting/diarrhea, no focal weakness, no fatigue, no paresthesias.  No acute events overnight per nursing staff.   Objective: Vitals:   09/06/21 2031 09/07/21 0512 09/07/21 1151 09/07/21 1312  BP: 115/74 130/73  127/73  Pulse: 100 (!) 103  (!) 101  Resp: 18 18  18   Temp: 97.6 F (36.4 C) 98.3 F (36.8 C)  98.1 F (36.7 C)  TempSrc: Oral Oral  Oral  SpO2: 98% 93%  95%  Weight:   59.2 kg   Height:   5' (1.524 m)     Intake/Output Summary (Last 24 hours) at 09/07/2021 1323 Last data filed at 09/07/2021 1120 Gross per 24 hour  Intake 3561.74 ml  Output 1000 ml  Net 2561.74 ml   Filed Weights   09/07/21 1151  Weight: 59.2 kg    Examination:  Physical Exam: GEN: NAD, alert, oriented to place Ginette Otto), Person Actor), time  (2023), elderly appearance HEENT: NCAT, PERRL, EOMI, sclera clear, MMM PULM: CTAB w/o wheezes/crackles, normal respiratory effort, on room air CV: RRR w/o M/G/R GI: abd soft, NTND, NABS, no R/G/M GU: Foley catheter noted with thin red blood in urine, no clots seen this a.m. MSK: no peripheral edema, muscle strength globally intact 5/5 bilateral upper/lower extremities NEURO: CN II-XII intact, no focal deficits, sensation to light touch intact PSYCH:  Depressed mood, + continued suicidal ideations Integumentary: dry/intact, no rashes or wounds    Data Reviewed: I have personally reviewed following labs and imaging studies  CBC: Recent Labs  Lab 09/04/21 1341 09/05/21 0901 09/06/21 0442 09/07/21 0446  WBC 14.5* 51.5* 23.7*  13.8*  NEUTROABS 9.7*  --  17.9* 9.5*  HGB 14.8 12.8* 10.5* 10.5*  HCT 47.6 41.7 33.1* 33.7*  MCV 87.2 88.7 87.1 86.9  PLT 56* 55* 41* 36*   Basic Metabolic Panel: Recent Labs  Lab 09/04/21 1341 09/05/21 0513 09/06/21 0442 09/07/21 0446  NA 140 142 140 143  K 4.0 4.4 3.7 3.5  CL 109 112* 114* 120*  CO2 25 25 23  21*  GLUCOSE 132* 149* 129* 101*  BUN 11 17 18 11   CREATININE 0.99 1.30* 0.83 0.73  CALCIUM 8.4* 8.3* 7.6* 7.6*  MG  --  1.9  --   --   PHOS  --  3.4  --   --    GFR: Estimated Creatinine Clearance: 47.7 mL/min (by C-G formula based on SCr of 0.73 mg/dL). Liver Function Tests: Recent Labs  Lab 09/04/21 1341  AST 15  ALT 13  ALKPHOS 76  BILITOT 0.8  PROT 5.9*  ALBUMIN 4.1   No results for input(s): "LIPASE", "AMYLASE" in the last 168 hours. No results for input(s): "AMMONIA" in the last 168 hours. Coagulation Profile: No results for input(s): "INR", "PROTIME" in the last 168 hours. Cardiac Enzymes: No results for input(s): "CKTOTAL", "CKMB", "CKMBINDEX", "TROPONINI" in the last 168 hours. BNP (last 3 results) No results for input(s): "PROBNP" in the last 8760 hours. HbA1C: No results for input(s): "HGBA1C" in the last 72 hours. CBG: Recent Labs  Lab 09/06/21 1931 09/07/21 0009 09/07/21 0509 09/07/21 0847 09/07/21 1150  GLUCAP 126* 103* 108* 103* 102*   Lipid Profile: No results for input(s): "CHOL", "HDL", "LDLCALC", "TRIG", "CHOLHDL", "LDLDIRECT" in the last 72 hours. Thyroid Function Tests: Recent Labs    09/05/21 0513  TSH 0.300*   Anemia Panel: No results for input(s): "VITAMINB12", "FOLATE", "FERRITIN", "TIBC", "IRON", "RETICCTPCT" in the last 72 hours. Sepsis Labs: Recent Labs  Lab 09/04/21 1342 09/04/21 1705 09/05/21 0513  LATICACIDVEN 2.1* 1.4 1.6    Recent Results (from the past 240 hour(s))  Urine Culture     Status: Abnormal   Collection Time: 08/29/21  6:20 AM   Specimen: Urine, Clean Catch  Result Value Ref Range  Status   Specimen Description URINE, CLEAN CATCH  Final   Special Requests   Final    NONE Performed at [facility], [address], [place], [place] [address]    Culture MULTIPLE SPECIES PRESENT, SUGGEST RECOLLECTION (A)  Final   Report Status 08/30/2021 FINAL  Final  Culture, blood (routine x 2)     Status: None (Preliminary result)   Collection Time: 09/04/21 12:41 PM   Specimen: BLOOD  Result Value Ref Range Status   Specimen Description   Final    BLOOD LEFT ANTECUBITAL Performed at [facility], [address] [place], [place] [address]    Special Requests   Final    BOTTLES DRAWN AEROBIC AND ANAEROBIC Blood Culture adequate volume Performed at [facility], [address] [place], [place] [address]    Culture   Final    NO GROWTH 3 DAYS Performed at [facility], [address] [place], [place] [address]    Report Status PENDING  Incomplete  Resp Panel by  RT-PCR (Flu A&B, Covid)     Status: None   Collection Time: 09/04/21  1:46 PM   Specimen: Nasal Swab  Result Value Ref Range Status   SARS Coronavirus 2 by RT PCR NEGATIVE NEGATIVE Final    Comment: (NOTE) SARS-CoV-2 target nucleic acids are NOT DETECTED.  The SARS-CoV-2 RNA is generally detectable in upper respiratory specimens during the acute phase of infection. The lowest concentration of SARS-CoV-2 viral copies this assay can detect is 138 copies/mL. A negative result does not preclude SARS-Cov-2 infection and should not be used as the sole basis for treatment or other patient management decisions. A negative result may occur with  improper specimen collection/handling, submission of specimen other than nasopharyngeal swab, presence of viral mutation(s) within the areas targeted by this assay, and inadequate number of viral copies(<138 copies/mL). A negative result must be combined with clinical observations, patient history, and  epidemiological information. The expected result is Negative.  Fact Sheet for Patients:  BloggerCourse.com  Fact Sheet for Healthcare Providers:  SeriousBroker.it  This test is no t yet approved or cleared by the [place] FDA and  has been authorized for detection and/or diagnosis of SARS-CoV-2 by FDA under an Emergency Use Authorization (EUA). This EUA will remain  in effect (meaning this test can be used) for the duration of the COVID-19 declaration under Section 564(b)(1) of the Act, 21 U.S.C.section 360bbb-3(b)(1), unless the authorization is terminated  or revoked sooner.       Influenza A by PCR NEGATIVE NEGATIVE Final   Influenza B by PCR NEGATIVE NEGATIVE Final    Comment: (NOTE) The Xpert Xpress SARS-CoV-2/FLU/RSV plus assay is intended as an aid in the diagnosis of influenza from Nasopharyngeal swab specimens and should not be used as a sole basis for treatment. Nasal washings and aspirates are unacceptable for Xpert Xpress SARS-CoV-2/FLU/RSV testing.  Fact Sheet for Patients: BloggerCourse.com  Fact Sheet for Healthcare Providers: SeriousBroker.it  This test is not yet approved or cleared by the [place] FDA and has been authorized for detection and/or diagnosis of SARS-CoV-2 by FDA under an Emergency Use Authorization (EUA). This EUA will remain in effect (meaning this test can be used) for the duration of the COVID-19 declaration under Section 564(b)(1) of the Act, 21 U.S.C. section 360bbb-3(b)(1), unless the authorization is terminated or revoked.  Performed at [facility], [address] [place], [place] [address]   Culture, blood (routine x 2)     Status: None (Preliminary result)   Collection Time: 09/04/21  2:00 PM   Specimen: BLOOD  Result Value Ref Range Status   Specimen Description   Final    BLOOD SITE NOT  SPECIFIED Performed at [facility], [address], [place], [place] [address]    Special Requests   Final    BOTTLES DRAWN AEROBIC AND ANAEROBIC Blood Culture results may not be optimal due to an excessive volume of blood received in culture bottles Performed at [facility], [address] [place], [place] [address]    Culture   Final    NO GROWTH 3 DAYS Performed at [facility], [address], [place], [place] [address]    Report Status PENDING  Incomplete  Urine Culture     Status: Abnormal   Collection Time: 09/04/21  5:03 PM   Specimen: Urine, Catheterized  Result Value Ref Range Status   Specimen Description   Final    URINE, CATHETERIZED Performed at [facility], [address], [place], [place] [address]    Special Requests   Final    NONE Performed at [facility], [address] [place], [place] [address]    Culture (A)  Final    1,000 COLONIES/mL STAPHYLOCOCCUS EPIDERMIDIS CALL MICROBIOLOGY LAB IF SENSITIVITIES ARE REQUIRED. Performed at [facility], [address] [place], [place] [address]    Report Status 09/06/2021 FINAL  Final         Radiology Studies: No results found.      Scheduled Meds:  Chlorhexidine Gluconate Cloth  6 each Topical Daily   ciprofloxacin-dexamethasone  3 drop Left EAR TID   finasteride  5 mg Oral Daily   fluticasone  1 spray Each Nare BID   loratadine  10 mg Oral Daily   sertraline  75 mg Oral Daily   tamsulosin  0.8 mg Oral Daily   Continuous Infusions:  cefTRIAXone (ROCEPHIN)  IV 2 g (09/07/21 1023)   metronidazole 500 mg (09/07/21 1315)     LOS: 2 days    Time spent: 48 minutes spent on chart review, discussion with nursing staff, consultants, updating family and interview/physical exam; more than 50% of that time was spent in counseling and/or coordination of care.    Alvira Philips [place], DO [facility] Available  via Epic secure chat 7am-7pm After these hours, please refer to coverage provider listed on [email or web address] 09/07/2021, 1:23 PM

## 2021-09-07 NOTE — Progress Notes (Signed)
Initial Nutrition Assessment  INTERVENTION:   -Boost Breeze po TID, each supplement provides 250 kcal and 9 grams of protein   NUTRITION DIAGNOSIS:   Inadequate oral intake related to  (AMS) as evidenced by per patient/family report.  GOAL:   Patient will meet greater than or equal to 90% of their needs  MONITOR:   PO intake, Supplement acceptance, Labs, Weight trends, I & O's  REASON FOR ASSESSMENT:   Malnutrition Screening Tool    ASSESSMENT:   88 y.o. male with past medical history significant for HTN, HLD, CAD, history of choledocholithiasis, history of laparoscopic cholecystectomy, BPH, overactive bladder, OSA not on CPAP, cognitive impairment, anxiety/depression, Leukemia who presented to [facility] ED on 8/18 via EMS after patient was found with increased lethargy and confusion  Patient currently consuming 10-90% of meals.  Pt IVC for suicidal ideation.  Reports poor appetite, PO have been variable. Will order Boost Breeze supplements while PO is inconsistent.   Per weight records, no weight loss noted.  Medications reviewed.  Labs reviewed: CBGs: 92-126   NUTRITION - FOCUSED PHYSICAL EXAM:  Unable to complete, AMS  Diet Order:   Diet Order             Diet regular Room service appropriate? Yes; Fluid consistency: Thin  Diet effective now                   EDUCATION NEEDS:   No education needs have been identified at this time  Skin:  Skin Assessment: Reviewed RN Assessment  Last BM:  8/21 -type 1  Height:   Ht Readings from Last 1 Encounters:  09/07/21 5' (1.524 m)    Weight:   Wt Readings from Last 1 Encounters:  09/07/21 59.2 kg   BMI:  Body mass index is 25.49 kg/m.  Estimated Nutritional Needs:   Kcal:  1450-1650  Protein:  70-80g  Fluid:  1.6L/day  Brandon Bibles, MS, RD, LDN Inpatient Clinical Dietitian Contact information available via Amion

## 2021-09-08 DIAGNOSIS — G928 Other toxic encephalopathy: Secondary | ICD-10-CM | POA: Diagnosis not present

## 2021-09-08 LAB — CBC WITH DIFFERENTIAL/PLATELET
Abs Immature Granulocytes: 1.2 10*3/uL — ABNORMAL HIGH (ref 0.00–0.07)
Basophils Absolute: 0.1 10*3/uL (ref 0.0–0.1)
Basophils Relative: 1 %
Eosinophils Absolute: 0.1 10*3/uL (ref 0.0–0.5)
Eosinophils Relative: 1 %
HCT: 36.9 % — ABNORMAL LOW (ref 39.0–52.0)
Hemoglobin: 11.6 g/dL — ABNORMAL LOW (ref 13.0–17.0)
Immature Granulocytes: 8 %
Lymphocytes Relative: 6 %
Lymphs Abs: 0.8 10*3/uL (ref 0.7–4.0)
MCH: 27 pg (ref 26.0–34.0)
MCHC: 31.4 g/dL (ref 30.0–36.0)
MCV: 85.8 fL (ref 80.0–100.0)
Monocytes Absolute: 2.5 10*3/uL — ABNORMAL HIGH (ref 0.1–1.0)
Monocytes Relative: 17 %
Neutro Abs: 10.2 10*3/uL — ABNORMAL HIGH (ref 1.7–7.7)
Neutrophils Relative %: 67 %
Platelets: 44 10*3/uL — ABNORMAL LOW (ref 150–400)
RBC: 4.3 MIL/uL (ref 4.22–5.81)
RDW: 15.2 % (ref 11.5–15.5)
WBC: 14.9 10*3/uL — ABNORMAL HIGH (ref 4.0–10.5)
nRBC: 0 % (ref 0.0–0.2)

## 2021-09-08 LAB — BASIC METABOLIC PANEL
Anion gap: 5 (ref 5–15)
BUN: 9 mg/dL (ref 8–23)
CO2: 20 mmol/L — ABNORMAL LOW (ref 22–32)
Calcium: 8.1 mg/dL — ABNORMAL LOW (ref 8.9–10.3)
Chloride: 115 mmol/L — ABNORMAL HIGH (ref 98–111)
Creatinine, Ser: 0.71 mg/dL (ref 0.61–1.24)
GFR, Estimated: >60 mL/min (ref >60)
Glucose, Bld: 95 mg/dL (ref 70–99)
Potassium: 3.4 mmol/L — ABNORMAL LOW (ref 3.5–5.1)
Sodium: 140 mmol/L (ref 135–145)

## 2021-09-08 LAB — GLUCOSE, CAPILLARY
Glucose-Capillary: 108 mg/dL — ABNORMAL HIGH (ref 70–99)
Glucose-Capillary: 108 mg/dL — ABNORMAL HIGH (ref 70–99)
Glucose-Capillary: 125 mg/dL — ABNORMAL HIGH (ref 70–99)
Glucose-Capillary: 135 mg/dL — ABNORMAL HIGH (ref 70–99)
Glucose-Capillary: 91 mg/dL (ref 70–99)
Glucose-Capillary: 96 mg/dL (ref 70–99)
Glucose-Capillary: 96 mg/dL (ref 70–99)

## 2021-09-08 MED ORDER — MELATONIN 3 MG PO TABS
3.0000 mg | ORAL_TABLET | Freq: Every day | ORAL | Status: DC
  Administered 2021-09-08 – 2021-09-16 (×9): 3 mg via ORAL
  Filled 2021-09-08 (×9): qty 1

## 2021-09-08 MED ORDER — POTASSIUM CHLORIDE CRYS ER 20 MEQ PO TBCR
40.0000 meq | EXTENDED_RELEASE_TABLET | Freq: Once | ORAL | Status: AC
  Administered 2021-09-08: 40 meq via ORAL
  Filled 2021-09-08 (×3): qty 2

## 2021-09-08 MED ORDER — TRAZODONE HCL 50 MG PO TABS
50.0000 mg | ORAL_TABLET | Freq: Every day | ORAL | Status: DC
  Administered 2021-09-08 – 2021-09-16 (×9): 50 mg via ORAL
  Filled 2021-09-08 (×9): qty 1

## 2021-09-08 MED ORDER — LORAZEPAM 0.5 MG PO TABS
0.5000 mg | ORAL_TABLET | Freq: Four times a day (QID) | ORAL | Status: DC | PRN
  Administered 2021-09-08: 0.5 mg via ORAL
  Filled 2021-09-08: qty 1

## 2021-09-08 NOTE — Plan of Care (Signed)

## 2021-09-08 NOTE — TOC Progression Note (Signed)
Transition of Care [facility]) - Progression Note    Patient Details  Name: [name] MRN: [id number] Date of Birth: 1933-04-27  Transition of [facility] CM/SW Contact  Leeroy Cha, RN Phone Number: 09/08/2021, 10:35 AM  Clinical Narrative:    Information for admission to [facility] geropsych unit refazed to [phone].  Will call back if there is a bed.        Expected Discharge Plan and Services                                                 Social Determinants of Health (SDOH) Interventions    Readmission Risk Interventions     No data to display

## 2021-09-08 NOTE — Consult Note (Signed)
Patient seen and attempted to assess. Patient observed to be sleeping at this time. He recently received prn medication, as he was becoming agitated due to lack of sleep the night previously. Discussed with family (daughter and granddaughter) in addition to sitter, sleep hygiene habits. Excessive daytime sleepiness will result in disruption of sleep wake cycle, which can contribute to worsening delirium.   Psychiatry will continue to follow. Patient continues to meet inpatient psychiatric criteria at this time. He is medically stable to discharge to inpatient psychiatric facility.

## 2021-09-08 NOTE — Progress Notes (Signed)
Patient was observed to be fidgeting with the peripheral IV in his left AC, upon assessment, patient had inflicted himself with a tear on his left arm. Charge nurse wad notified and tear was dressed and covered with petroleum gauze.

## 2021-09-08 NOTE — Progress Notes (Addendum)
PROGRESS NOTE    [name]  XBM:[id number] DOB: [birth year] DOA: 09/04/2021 PCP: Doreene Nest, NP    Brief Narrative:   EMETERIO [name] is a 88 y.o. male with past medical history significant for HTN, HLD, CAD, history of choledocholithiasis, history of laparoscopic cholecystectomy, BPH, overactive bladder, OSA not on CPAP, cognitive impairment, anxiety/depression, Leukemia who presented to [facility] ED on 8/18 via EMS after patient was found with increased lethargy and confusion today by daughter.  Patient unable to answer questions and history obtained from both daughters present at bedside.  Patient currently lives alone but daughters place his daily medications out for him so there is no confusion.  Patient with recent hospitalizations for similar events recently, also reported previous hallucinations thought to be due to previous Remeron and trazodone use which has now been discontinued for roughly 1 week.  Daughter was also reported that patient did have access to a cough medicine that he may have taken.  They are unaware of any other sedating or narcotics available for him at home.  Only other new medication for patient is Ciprodex eardrops after having trauma to his left ear.   Received report from daughters that patient has had increased suicidal thoughts including stating "I am going to blow my brains out with a gun" when he was at a bank recently.  Patient's daughter also found empty Dilaudid container at home; which was apparently his wife's previous medication in which she has now passed 2 years ago from pancreatic cancer.  Concern for intentional overdose.   In the ED, temperature 97.6 F, HR 100, RR 12, BP 120/65, 92% on room air.  WBC 14.5, hemoglobin 14.8, platelets 56.  Sodium 140, potassium 4.0, chloride 109, CO2 25, BUN 11, creatinine 0.99.  Glucose 132.  LFTs/total bilirubin within normal limits.  Lactic acid 2.1.  BNP 196.8.  COVID-19 PCR negative.  Influenza A/B PCR  negative.  CT head without contrast with no acute intracranial abnormality, chronic small vessel ischemic disease and cerebral atrophy.  Chest x-ray with no acute cardiopulmonary disease process.  EDP consulted [facility] for admission for further evaluation and work-up of acute metabolic encephalopathy.  Assessment & Plan:   Acute metabolic encephalopathy 2/2 intentional opioid overdose Suicide attempt, continued ideations Patient presenting to the ED with recurrent confusion, lethargy and altered mental status.  Patient has been seen by his PCP and in the ED over the last few weeks with similar events.  Patient was recently stopped on Remeron and trazodone as likely contributing factors to his previous hallucinations/somnolence.  On arrival, patient was afebrile but with mild leukocytosis.  Chest x-ray unrevealing.  CT head without contrast negative for acute intracranial abnormality.  Given patient's pinpoint pupils, concern for narcotic induced somnolence.  Patient was given Narcan with immediate improvement of his mental status.  MR brain without contrast with no acute findings.  UDS positive for opiates.  Seen by psychiatry with recommendation of transfer to [facility] psych facility. --Psychiatry following, appreciate assistance --Continue suicide precautions, one-to-one sitter, under involuntary commitment --Continue supportive care, IV fluid hydration, Narcan as needed. --Discussed with daughters need removal of all weapons including guns from household; which they have accomplished --Ativan 0.5 mg p.o. as needed for agitation --Social work consulted for [employer] psych placement; medically stable for discharge once bed available  Hypokalemia Potassium 3.4, will replete. --Repeat electrolytes in a.m. to include magnesium   Lactic acidosis: Resolved Lactic acid 2.1 on admission, likely secondary to dehydration in the setting  of encephalopathy.  No focal infectious etiology found such far. --Lactic acid  2.1>1.6 --Continue IV fluid hydration   Leukocytosis Aspiration pneumonia WBC count 14.5 on admission; now increased to 51.5.  Concern for aspiration event following intentional overdose. --WBC 14.5>51.5>23.7>13.8>14.9 --Blood cultures x2: No growth x 4 days --urine culture: Staph epidermidis, likely contaminant --Ceftriaxone 2 g IV every 24 hours --Metronidazole 500 mg IV q12h --CBC daily  Acute urinary retention Hematuria BPH Hematuria likely secondary to traumatic Foley placement in the ED.  Attempted to discontinue Foley catheter with continued urinary retention and hematuria.  Urology consulted and patient underwent coud catheter placement by Dr. Ronne Binning on 09/05/2021. --Continue tamsulosin 0.4 mg p.o. daily --Started finasteride 5 mg p.o. daily --Continue Foley catheter, flush as needed for clots --Continue monitoring CBC daily; hemoglobin currently stable --Will likely need to discharge with Foley in place and outpatient follow-up with urology, Dr. Alvester Morin following discharge   Left otitis externa --Continue Ciprodex eardrops 3 times daily   Anxiety/depression:  --sertraline increased to 75 mg p.o. daily   CAD/HTN/HLD: Currently not on medication outpatient  Insomnia: -- Melatonin 3 mg p.o. nightly -Try-Sedonium 50 mg p.o. nightly   DVT prophylaxis: SCDs Start: 09/04/21 1721    Code Status: DNR Family Communication: Updated daughter present at bedside this morning  Disposition Plan:  Level of care: Telemetry Status is: Inpatient Remains inpatient appropriate because: IV antibiotics, will need [facility] psych placement   Consultants:  Psychiatry Urology  Procedures:  None  Antimicrobials:  Ceftriaxone 8/19>> Metronidazole 8/19>>   Subjective: Patient seen examined bedside, resting comfortably.  Lying in bed.  Daughter present.  Hematuria almost all resolved.  Daughter reports patient is intermittently confused.  Agitated that he has to remain in the bed with  a sitter present.  Currently medically stable for discharge to inpatient psych once bed available, discussed with psychiatry today.  Continues with suicidal ideations, states "would do it again", reports that he is a burden overall as well as a financial burden.  Patient reports that his father died when he was 86 and his spouse died 2 years ago after a long marriage with pancreatic cancer.  Patient with no other complaints or concerns at this time.  Denies headache, no chest pain, no palpitations, no shortness of breath, no abdominal pain, no fever/chills/night sweats, no nausea/vomiting/diarrhea, no focal weakness, no fatigue, no paresthesias.  No acute events overnight per nursing staff.   Objective: Vitals:   09/07/21 2006 09/08/21 0506 09/08/21 0729 09/08/21 0855  BP: 130/74 137/77 (!) 146/77 120/66  Pulse: (!) 107 (!) 102 (!) 109 (!) 109  Resp: 18 (!) 22 18 19   Temp: 98.4 F (36.9 C) 97.6 F (36.4 C)    TempSrc: Oral Oral    SpO2: 96% 97% 95% 93%  Weight:      Height:        Intake/Output Summary (Last 24 hours) at 09/08/2021 1312 Last data filed at 09/08/2021 1136 Gross per 24 hour  Intake 380 ml  Output 2375 ml  Net -1995 ml   Filed Weights   09/07/21 1151  Weight: 59.2 kg    Examination:  Physical Exam: GEN: NAD, alert, oriented to place Ginette Otto), Person Actor), time  (2023), elderly appearance HEENT: NCAT, PERRL, EOMI, sclera clear, MMM PULM: CTAB w/o wheezes/crackles, normal respiratory effort, on room air CV: RRR w/o M/G/R GI: abd soft, NTND, NABS, no R/G/M GU: Foley catheter noted with thin red blood in urine, no clots seen this a.m. MSK:  no peripheral edema, muscle strength globally intact 5/5 bilateral upper/lower extremities NEURO: CN II-XII intact, no focal deficits, sensation to light touch intact PSYCH: Depressed mood, + continued suicidal ideations Integumentary: dry/intact, no rashes or wounds    Data Reviewed: I have personally reviewed  following labs and imaging studies  CBC: Recent Labs  Lab 09/04/21 1341 09/05/21 0901 09/06/21 0442 09/07/21 0446 09/08/21 0452  WBC 14.5* 51.5* 23.7* 13.8* 14.9*  NEUTROABS 9.7*  --  17.9* 9.5* 10.2*  HGB 14.8 12.8* 10.5* 10.5* 11.6*  HCT 47.6 41.7 33.1* 33.7* 36.9*  MCV 87.2 88.7 87.1 86.9 85.8  PLT 56* 55* 41* 36* 44*   Basic Metabolic Panel: Recent Labs  Lab 09/04/21 1341 09/05/21 0513 09/06/21 0442 09/07/21 0446 09/08/21 0452  NA 140 142 140 143 140  K 4.0 4.4 3.7 3.5 3.4*  CL 109 112* 114* 120* 115*  CO2 25 25 23  21* 20*  GLUCOSE 132* 149* 129* 101* 95  BUN 11 17 18 11 9   CREATININE 0.99 1.30* 0.83 0.73 0.71  CALCIUM 8.4* 8.3* 7.6* 7.6* 8.1*  MG  --  1.9  --   --   --   PHOS  --  3.4  --   --   --    GFR: Estimated Creatinine Clearance: 47.7 mL/min (by C-G formula based on SCr of 0.71 mg/dL). Liver Function Tests: Recent Labs  Lab 09/04/21 1341  AST 15  ALT 13  ALKPHOS 76  BILITOT 0.8  PROT 5.9*  ALBUMIN 4.1   No results for input(s): "LIPASE", "AMYLASE" in the last 168 hours. No results for input(s): "AMMONIA" in the last 168 hours. Coagulation Profile: No results for input(s): "INR", "PROTIME" in the last 168 hours. Cardiac Enzymes: No results for input(s): "CKTOTAL", "CKMB", "CKMBINDEX", "TROPONINI" in the last 168 hours. BNP (last 3 results) No results for input(s): "PROBNP" in the last 8760 hours. HbA1C: No results for input(s): "HGBA1C" in the last 72 hours. CBG: Recent Labs  Lab 09/07/21 1946 09/08/21 0000 09/08/21 0436 09/08/21 0704 09/08/21 1219  GLUCAP 132* 91 96 96 108*   Lipid Profile: No results for input(s): "CHOL", "HDL", "LDLCALC", "TRIG", "CHOLHDL", "LDLDIRECT" in the last 72 hours. Thyroid Function Tests: No results for input(s): "TSH", "T4TOTAL", "FREET4", "T3FREE", "THYROIDAB" in the last 72 hours.  Anemia Panel: No results for input(s): "VITAMINB12", "FOLATE", "FERRITIN", "TIBC", "IRON", "RETICCTPCT" in the last 72  hours. Sepsis Labs: Recent Labs  Lab 09/04/21 1342 09/04/21 1705 09/05/21 0513  LATICACIDVEN 2.1* 1.4 1.6    Recent Results (from the past 240 hour(s))  Culture, blood (routine x 2)     Status: None (Preliminary result)   Collection Time: 09/04/21 12:41 PM   Specimen: BLOOD  Result Value Ref Range Status   Specimen Description   Final    BLOOD LEFT ANTECUBITAL Performed at [facility], [address] [place], [place] [address]    Special Requests   Final    BOTTLES DRAWN AEROBIC AND ANAEROBIC Blood Culture adequate volume Performed at [facility], [address], [place], [place] [address]    Culture   Final    NO GROWTH 4 DAYS Performed at [facility], [address], [place], [place] [address]    Report Status PENDING  Incomplete  Resp Panel by RT-PCR (Flu A&B, Covid)     Status: None   Collection Time: 09/04/21  1:46 PM   Specimen: Nasal Swab  Result Value Ref Range Status  SARS Coronavirus 2 by RT PCR NEGATIVE NEGATIVE Final    Comment: (NOTE) SARS-CoV-2 target nucleic acids are NOT DETECTED.  The SARS-CoV-2 RNA is generally detectable in upper respiratory specimens during the acute phase of infection. The lowest concentration of SARS-CoV-2 viral copies this assay can detect is 138 copies/mL. A negative result does not preclude SARS-Cov-2 infection and should not be used as the sole basis for treatment or other patient management decisions. A negative result may occur with  improper specimen collection/handling, submission of specimen other than nasopharyngeal swab, presence of viral mutation(s) within the areas targeted by this assay, and inadequate number of viral copies(<138 copies/mL). A negative result must be combined with clinical observations, patient history, and epidemiological information. The expected result is Negative.  Fact Sheet for Patients:  BloggerCourse.com  Fact Sheet  for Healthcare Providers:  SeriousBroker.it  This test is no t yet approved or cleared by the [place] FDA and  has been authorized for detection and/or diagnosis of SARS-CoV-2 by FDA under an Emergency Use Authorization (EUA). This EUA will remain  in effect (meaning this test can be used) for the duration of the COVID-19 declaration under Section 564(b)(1) of the Act, 21 U.S.C.section 360bbb-3(b)(1), unless the authorization is terminated  or revoked sooner.       Influenza A by PCR NEGATIVE NEGATIVE Final   Influenza B by PCR NEGATIVE NEGATIVE Final    Comment: (NOTE) The Xpert Xpress SARS-CoV-2/FLU/RSV plus assay is intended as an aid in the diagnosis of influenza from Nasopharyngeal swab specimens and should not be used as a sole basis for treatment. Nasal washings and aspirates are unacceptable for Xpert Xpress SARS-CoV-2/FLU/RSV testing.  Fact Sheet for Patients: BloggerCourse.com  Fact Sheet for Healthcare Providers: SeriousBroker.it  This test is not yet approved or cleared by the [place] FDA and has been authorized for detection and/or diagnosis of SARS-CoV-2 by FDA under an Emergency Use Authorization (EUA). This EUA will remain in effect (meaning this test can be used) for the duration of the COVID-19 declaration under Section 564(b)(1) of the Act, 21 U.S.C. section 360bbb-3(b)(1), unless the authorization is terminated or revoked.  Performed at [facility], [address] [place], [place] [address]   Culture, blood (routine x 2)     Status: None (Preliminary result)   Collection Time: 09/04/21  2:00 PM   Specimen: BLOOD  Result Value Ref Range Status   Specimen Description   Final    BLOOD SITE NOT SPECIFIED Performed at [facility], [address], [place], [place] [address]    Special Requests   Final    BOTTLES DRAWN AEROBIC AND  ANAEROBIC Blood Culture results may not be optimal due to an excessive volume of blood received in culture bottles Performed at [facility], [address] [place], [place] [address]    Culture   Final    NO GROWTH 4 DAYS Performed at [facility], [address], [place], [place] [address]    Report Status PENDING  Incomplete  Urine Culture     Status: Abnormal   Collection Time: 09/04/21  5:03 PM   Specimen: Urine, Catheterized  Result Value Ref Range Status   Specimen Description   Final    URINE, CATHETERIZED Performed at [facility], [address], [place], [place] [address]    Special Requests   Final    NONE Performed at [facility], [address]  [place], [place] [address]    Culture (A)  Final    1,000 COLONIES/mL STAPHYLOCOCCUS EPIDERMIDIS CALL MICROBIOLOGY LAB IF SENSITIVITIES ARE REQUIRED. Performed at [facility], [address] [place], [place] [address]    Report Status 09/06/2021 FINAL  Final         Radiology Studies: No results found.      Scheduled Meds:  Chlorhexidine Gluconate Cloth  6 each Topical Daily   ciprofloxacin-dexamethasone  3 drop Left EAR TID   feeding supplement  1 Container Oral TID BM   finasteride  5 mg Oral Daily   fluticasone  1 spray Each Nare BID   gabapentin  100 mg Oral BID   loratadine  10 mg Oral Daily   melatonin  3 mg Oral QHS   sertraline  75 mg Oral Daily   tamsulosin  0.8 mg Oral Daily   traZODone  50 mg Oral QHS   Continuous Infusions:  cefTRIAXone (ROCEPHIN)  IV 2 g (09/08/21 1217)   metronidazole 500 mg (09/08/21 1042)     LOS: 3 days    Time spent: 48 minutes spent on chart review, discussion with nursing staff, consultants, updating family and interview/physical exam; more than 50% of that time was spent in counseling and/or coordination of care.    Alvira Philips [place], DO [facility] Available via Epic secure chat  7am-7pm After these hours, please refer to coverage provider listed on [email or web address] 09/08/2021, 1:12 PM

## 2021-09-08 NOTE — Telephone Encounter (Signed)
Left message on voicemail for [name] to call the office back during business hours.

## 2021-09-08 NOTE — Telephone Encounter (Signed)
Called number provided. Was not a secured voicemail with clear identifier no message left.

## 2021-09-08 NOTE — Care Management Important Message (Signed)
Important Message  Patient Details IM Letter placed in Patients room. Name: [name] MRN: [id number] Date of Birth: Oct 27, 1933   Medicare Important Message Given:  Yes     Kerin Salen 09/08/2021, 2:06 PM

## 2021-09-09 ENCOUNTER — Other Ambulatory Visit: Payer: Self-pay | Admitting: Nurse Practitioner

## 2021-09-09 DIAGNOSIS — T50902A Poisoning by unspecified drugs, medicaments and biological substances, intentional self-harm, initial encounter: Secondary | ICD-10-CM | POA: Diagnosis not present

## 2021-09-09 DIAGNOSIS — N401 Enlarged prostate with lower urinary tract symptoms: Secondary | ICD-10-CM | POA: Diagnosis not present

## 2021-09-09 DIAGNOSIS — F322 Major depressive disorder, single episode, severe without psychotic features: Secondary | ICD-10-CM

## 2021-09-09 DIAGNOSIS — R338 Other retention of urine: Secondary | ICD-10-CM

## 2021-09-09 DIAGNOSIS — G9341 Metabolic encephalopathy: Secondary | ICD-10-CM | POA: Diagnosis not present

## 2021-09-09 DIAGNOSIS — I10 Essential (primary) hypertension: Secondary | ICD-10-CM

## 2021-09-09 DIAGNOSIS — R3912 Poor urinary stream: Secondary | ICD-10-CM

## 2021-09-09 DIAGNOSIS — G928 Other toxic encephalopathy: Secondary | ICD-10-CM | POA: Diagnosis not present

## 2021-09-09 LAB — CBC
HCT: 39.2 % (ref 39.0–52.0)
Hemoglobin: 12 g/dL — ABNORMAL LOW (ref 13.0–17.0)
MCH: 26.8 pg (ref 26.0–34.0)
MCHC: 30.6 g/dL (ref 30.0–36.0)
MCV: 87.7 fL (ref 80.0–100.0)
Platelets: 54 10*3/uL — ABNORMAL LOW (ref 150–400)
RBC: 4.47 MIL/uL (ref 4.22–5.81)
RDW: 15.3 % (ref 11.5–15.5)
WBC: 15.5 10*3/uL — ABNORMAL HIGH (ref 4.0–10.5)
nRBC: 0 % (ref 0.0–0.2)

## 2021-09-09 LAB — GLUCOSE, CAPILLARY
Glucose-Capillary: 104 mg/dL — ABNORMAL HIGH (ref 70–99)
Glucose-Capillary: 100 mg/dL — ABNORMAL HIGH (ref 70–99)
Glucose-Capillary: 120 mg/dL — ABNORMAL HIGH (ref 70–99)
Glucose-Capillary: 100 mg/dL — ABNORMAL HIGH (ref 70–99)
Glucose-Capillary: 163 mg/dL — ABNORMAL HIGH (ref 70–99)

## 2021-09-09 LAB — CULTURE, BLOOD (ROUTINE X 2)
Culture: NO GROWTH
Culture: NO GROWTH
Special Requests: ADEQUATE

## 2021-09-09 LAB — BASIC METABOLIC PANEL
Anion gap: 4 — ABNORMAL LOW (ref 5–15)
BUN: 8 mg/dL (ref 8–23)
CO2: 23 mmol/L (ref 22–32)
Calcium: 8.2 mg/dL — ABNORMAL LOW (ref 8.9–10.3)
Chloride: 116 mmol/L — ABNORMAL HIGH (ref 98–111)
Creatinine, Ser: 0.69 mg/dL (ref 0.61–1.24)
GFR, Estimated: >60 mL/min (ref >60)
Glucose, Bld: 105 mg/dL — ABNORMAL HIGH (ref 70–99)
Potassium: 3.5 mmol/L (ref 3.5–5.1)
Sodium: 143 mmol/L (ref 135–145)

## 2021-09-09 LAB — MAGNESIUM: Magnesium: 1.9 mg/dL (ref 1.7–2.4)

## 2021-09-09 NOTE — Progress Notes (Signed)
PROGRESS NOTE    [name]  QZR:[id number] DOB: 02-09-1933 DOA: 09/04/2021 PCP: Pleas Koch, NP   Brief Narrative:  88 y.o. male with past medical history significant for HTN, HLD, CAD, choledocholithiasis, laparoscopic cholecystectomy, BPH, overactive bladder, OSA not on CPAP, cognitive impairment, anxiety/depression, Leukemia presented on 09/04/2021 with increased lethargy and confusion, hallucinations with possible suicidal ideation.  Patient's daughter found empty Dilaudid container at home, which was apparently his wife's previous medication (wife passed away 2 years ago from pancreatic cancer).  There was a concern for intentional overdose.  On presentation, CT of the head without contrast was without acute intracranial abnormity.  Chest x-ray showed no acute cardiopulmonary disease process.  Patient was IVC'd.  Psychiatry was consulted.  Psychiatry recommended transfer to [facility] facility.  Assessment & Plan:   Acute metabolic/toxic encephalopathy secondary to possible intentional opiate overdose Suicide attempt, continued ideations Anxiety/depression -Psychiatry following.  Currently IVCD'd.  Psychiatry recommending transfer to [facility] facility.  Social worker following. -Continue suicide precautions, one-to-one sitter. -Currently on trazodone, sertraline, gabapentin as per psychiatry -As needed Ativan for agitation -Currently medically stable for discharge  Aspiration pneumonia -Currently on Rocephin and Flagyl.  Today is day 5 of antibiotics.  DC antibiotics. -Currently on room air.  Diet as per SLP recommendations.  Leukocytosis -Still significant but much improved. -Blood cultures negative so far.  Urine culture grew Staph epidermidis: Likely contaminant  Acute urinary retention Hematuria BPH -Hematuria likely secondary to traumatic Foley placement in the ED.  Attempted to discontinue Foley catheter with continued urinary retention and hematuria.  Urology  consulted and patient underwent coud catheter placement by Dr. Alyson Ingles on 09/05/2021. -Continue Flomax and finasteride. -Continue Foley catheter: Flush as needed for clots.  Outpatient follow-up with urology.  Will need to discharge with indwelling Foley catheter in place.  Left otitis externa -Continue Ciprodex eardrops  CAD/hypertension/hyperlipidemia -Currently not on any medication at this time.  Outpatient follow-up with PCP   DVT prophylaxis: SCDs Code Status: DNR Family Communication: Daughter at bedside Disposition Plan: Status is: Inpatient Remains inpatient appropriate because: Of need for [facility] psych placement.  Currently medically stable for discharge    Consultants: Psychiatry/urology  Procedures: None  Antimicrobials:  Anti-infectives (From admission, onward)    Start     Dose/Rate Route Frequency Ordered Stop   09/05/21 1115  cefTRIAXone (ROCEPHIN) 2 g in sodium chloride 0.9 % 100 mL IVPB        2 g 200 mL/hr over 30 Minutes Intravenous Every 24 hours 09/05/21 1019 09/12/21 1114   09/05/21 1115  metroNIDAZOLE (FLAGYL) IVPB 500 mg        500 mg 100 mL/hr over 60 Minutes Intravenous Every 12 hours 09/05/21 1019 09/12/21 1114        Subjective: Patient seen and examined at bedside.  Hard of hearing.  No overnight fever, chest pain, agitation reported.  Still has intermittent cough.  Daughter at bedside.  Objective: Vitals:   09/08/21 1401 09/08/21 1733 09/08/21 2146 09/09/21 1235  BP: (!) 138/113 121/74 (!) 123/57 119/60  Pulse: 72 (!) 107 (!) 101 (!) 101  Resp: '16 19 16 18  '$ Temp:  97.6 F (36.4 C) 98.2 F (36.8 C) 97.9 F (36.6 C)  TempSrc:  Axillary Oral Oral  SpO2: 94% 95% 95% 98%  Weight:      Height:        Intake/Output Summary (Last 24 hours) at 09/09/2021 1340 Last data filed at 09/09/2021 0700 Gross per 24 hour  Intake 250 ml  Output 1250 ml  Net -1000 ml   Filed Weights   09/07/21 1151  Weight: 59.2 kg     Examination:  General exam: Appears calm and comfortable.  Currently on room air.  Elderly male lying in bed.  Hard of hearing. Respiratory system: Bilateral decreased breath sounds at bases with some scattered crackles Cardiovascular system: S1 & S2 heard, mild intermittent tachycardia present  gastrointestinal system: Abdomen is nondistended, soft and nontender. Normal bowel sounds heard. Extremities: No cyanosis, clubbing; trace lower extremity edema Central nervous system: Alert, slow to respond, poor historian. No focal neurological deficits. Moving extremities Skin: No rashes, lesions or ulcers Psychiatry: Flat affect.  No signs of agitation.   Data Reviewed: I have personally reviewed following labs and imaging studies  CBC: Recent Labs  Lab 09/04/21 1341 09/05/21 0901 09/06/21 0442 09/07/21 0446 09/08/21 0452 09/09/21 0529  WBC 14.5* 51.5* 23.7* 13.8* 14.9* 15.5*  NEUTROABS 9.7*  --  17.9* 9.5* 10.2*  --   HGB 14.8 12.8* 10.5* 10.5* 11.6* 12.0*  HCT 47.6 41.7 33.1* 33.7* 36.9* 39.2  MCV 87.2 88.7 87.1 86.9 85.8 87.7  PLT 56* 55* 41* 36* 44* 54*   Basic Metabolic Panel: Recent Labs  Lab 09/05/21 0513 09/06/21 0442 09/07/21 0446 09/08/21 0452 09/09/21 0529  NA 142 140 143 140 143  K 4.4 3.7 3.5 3.4* 3.5  CL 112* 114* 120* 115* 116*  CO2 25 23 21* 20* 23  GLUCOSE 149* 129* 101* 95 105*  BUN '17 18 11 9 8  '$ CREATININE 1.30* 0.83 0.73 0.71 0.69  CALCIUM 8.3* 7.6* 7.6* 8.1* 8.2*  MG 1.9  --   --   --  1.9  PHOS 3.4  --   --   --   --    GFR: Estimated Creatinine Clearance: 47.7 mL/min (by C-G formula based on SCr of 0.69 mg/dL). Liver Function Tests: Recent Labs  Lab 09/04/21 1341  AST 15  ALT 13  ALKPHOS 76  BILITOT 0.8  PROT 5.9*  ALBUMIN 4.1   No results for input(s): "LIPASE", "AMYLASE" in the last 168 hours. No results for input(s): "AMMONIA" in the last 168 hours. Coagulation Profile: No results for input(s): "INR", "PROTIME" in the last  168 hours. Cardiac Enzymes: No results for input(s): "CKTOTAL", "CKMB", "CKMBINDEX", "TROPONINI" in the last 168 hours. BNP (last 3 results) No results for input(s): "PROBNP" in the last 8760 hours. HbA1C: No results for input(s): "HGBA1C" in the last 72 hours. CBG: Recent Labs  Lab 09/08/21 2025 09/08/21 2356 09/09/21 0411 09/09/21 0746 09/09/21 1136  GLUCAP 125* 135* 104* 100* 120*   Lipid Profile: No results for input(s): "CHOL", "HDL", "LDLCALC", "TRIG", "CHOLHDL", "LDLDIRECT" in the last 72 hours. Thyroid Function Tests: No results for input(s): "TSH", "T4TOTAL", "FREET4", "T3FREE", "THYROIDAB" in the last 72 hours. Anemia Panel: No results for input(s): "VITAMINB12", "FOLATE", "FERRITIN", "TIBC", "IRON", "RETICCTPCT" in the last 72 hours. Sepsis Labs: Recent Labs  Lab 09/04/21 1342 09/04/21 1705 09/05/21 0513  LATICACIDVEN 2.1* 1.4 1.6    Recent Results (from the past 240 hour(s))  Culture, blood (routine x 2)     Status: None   Collection Time: 09/04/21 12:41 PM   Specimen: BLOOD  Result Value Ref Range Status   Specimen Description   Final    BLOOD LEFT ANTECUBITAL Performed at [facility], [name] [address], [place], [place] [address]    Special Requests   Final    BOTTLES DRAWN AEROBIC  AND ANAEROBIC Blood Culture adequate volume Performed at [place] [address], [place], [place] [address]    Culture   Final    NO GROWTH 5 DAYS Performed at [facility], [place] [address], [place], [place] [address]    Report Status 09/09/2021 FINAL  Final  Resp Panel by RT-PCR (Flu A&B, Covid)     Status: None   Collection Time: 09/04/21  1:46 PM   Specimen: Nasal Swab  Result Value Ref Range Status   SARS Coronavirus 2 by RT PCR NEGATIVE NEGATIVE Final    Comment: (NOTE) SARS-CoV-2 target nucleic acids are NOT DETECTED.  The SARS-CoV-2 RNA is generally detectable in upper respiratory specimens during the acute phase  of infection. The lowest concentration of SARS-CoV-2 viral copies this assay can detect is 138 copies/mL. A negative result does not preclude SARS-Cov-2 infection and should not be used as the sole basis for treatment or other patient management decisions. A negative result may occur with  improper specimen collection/handling, submission of specimen other than nasopharyngeal swab, presence of viral mutation(s) within the areas targeted by this assay, and inadequate number of viral copies(<138 copies/mL). A negative result must be combined with clinical observations, patient history, and epidemiological information. The expected result is Negative.  Fact Sheet for Patients:  EntrepreneurPulse.com.au  Fact Sheet for Healthcare Providers:  IncredibleEmployment.be  This test is no t yet approved or cleared by the [place] FDA and  has been authorized for detection and/or diagnosis of SARS-CoV-2 by FDA under an Emergency Use Authorization (EUA). This EUA will remain  in effect (meaning this test can be used) for the duration of the COVID-19 declaration under Section 564(b)(1) of the Act, 21 U.S.C.section 360bbb-3(b)(1), unless the authorization is terminated  or revoked sooner.       Influenza A by PCR NEGATIVE NEGATIVE Final   Influenza B by PCR NEGATIVE NEGATIVE Final    Comment: (NOTE) The Xpert Xpress SARS-CoV-2/FLU/RSV plus assay is intended as an aid in the diagnosis of influenza from Nasopharyngeal swab specimens and should not be used as a sole basis for treatment. Nasal washings and aspirates are unacceptable for Xpert Xpress SARS-CoV-2/FLU/RSV testing.  Fact Sheet for Patients: EntrepreneurPulse.com.au  Fact Sheet for Healthcare Providers: IncredibleEmployment.be  This test is not yet approved or cleared by the [place] FDA and has been authorized for detection and/or diagnosis of  SARS-CoV-2 by FDA under an Emergency Use Authorization (EUA). This EUA will remain in effect (meaning this test can be used) for the duration of the COVID-19 declaration under Section 564(b)(1) of the Act, 21 U.S.C. section 360bbb-3(b)(1), unless the authorization is terminated or revoked.  Performed at [facility], [place] [address] [place], [place] [address]   Culture, blood (routine x 2)     Status: None   Collection Time: 09/04/21  2:00 PM   Specimen: BLOOD  Result Value Ref Range Status   Specimen Description   Final    BLOOD SITE NOT SPECIFIED Performed at [place] [address] [place], [place] [address]    Special Requests   Final    BOTTLES DRAWN AEROBIC AND ANAEROBIC Blood Culture results may not be optimal due to an excessive volume of blood received in culture bottles Performed at [place] [address] [place], [place] [address]    Culture   Final    NO GROWTH 5 DAYS Performed at [facility], [place]  [address] [place], [place] [address]    Report Status 09/09/2021 FINAL  Final  Urine Culture     Status: Abnormal   Collection Time: 09/04/21  5:03 PM   Specimen: Urine, Catheterized  Result Value Ref Range Status   Specimen Description   Final    URINE, CATHETERIZED Performed at [place] [address], [place], [place] [address]    Special Requests   Final    NONE Performed at [facility], [place] [address] [place], [place] [address]    Culture (A)  Final    1,000 COLONIES/mL STAPHYLOCOCCUS EPIDERMIDIS CALL MICROBIOLOGY LAB IF SENSITIVITIES ARE REQUIRED. Performed at [facility], [place] [address] [place], [place] [address]    Report Status 09/06/2021 FINAL  Final         Radiology Studies: No results found.      Scheduled Meds:  Chlorhexidine Gluconate Cloth  6 each Topical Daily   ciprofloxacin-dexamethasone  3 drop Left EAR  TID   feeding supplement  1 Container Oral TID BM   finasteride  5 mg Oral Daily   fluticasone  1 spray Each Nare BID   gabapentin  100 mg Oral BID   loratadine  10 mg Oral Daily   melatonin  3 mg Oral QHS   sertraline  75 mg Oral Daily   tamsulosin  0.8 mg Oral Daily   traZODone  50 mg Oral QHS   Continuous Infusions:  cefTRIAXone (ROCEPHIN)  IV 2 g (09/09/21 1133)   metronidazole 500 mg (09/08/21 2235)          Aline August, MD [facility] 09/09/2021, 1:40 PM

## 2021-09-09 NOTE — Telephone Encounter (Signed)
Called and gave [facility] approval on her request per Allie Bossier NP.

## 2021-09-09 NOTE — Progress Notes (Signed)
[name] is a 88 y.o. male with past medical history significant for HTN, HLD, CAD, choledocholithiasis, laparoscopic cholecystectomy, BPH, overactive bladder, OSA not on CPAP, cognitive impairment, anxiety/depression, Leukemia presented on 09/04/2021 with increased lethargy and confusion, hallucinations with possible suicidal ideation.  Patient's daughter found empty Dilaudid container at home, which was apparently his wife's previous medication (wife passed away 2 years ago from pancreatic cancer).  There was a concern for intentional overdose.  On presentation, CT of the head without contrast was without acute intracranial abnormity.  Chest x-ray showed no acute cardiopulmonary disease process.  Patient was IVC'd.  Psychiatry was consulted.  Psychiatry recommended transfer to [facility] facility.  Attempted to assess the patient today. Patient noted to be sleeping. On chart review, it is noted that the patient has been ambulating and was recently up with physical therapy. Patient's daughter and nurse report that the patient has continued to openly discuss his suicide attempt and has repeatedly stated that he will attempt again if he has the opportunity.   Continue to recommend inpatient psychiatric treatment.

## 2021-09-09 NOTE — Progress Notes (Signed)
Mobility Specialist - Progress Note     09/09/21 1225  Mobility  Activity Ambulated with assistance in hallway  Level of Assistance Minimal assist, patient does 75% or more  Assistive Device Front wheel walker  Distance Ambulated (ft) 250 ft  Activity Response Tolerated well  $Mobility charge 1 Mobility   Pt was in the restroom with NT and agreeable to mobilize. Needed cues to help with posture while ambulating and was stand-by. Upon returning to room needed min-A with getting feet back in bed. Was left in bed with all necessities in reach and NT in room.  Ferd Hibbs Mobility Specialist

## 2021-09-09 NOTE — Progress Notes (Signed)
Patient's bed found to be wet with urine and fecal matter. Upon assessment, foley tubing had no urine. Foley was irrigated with 67m of saline in a sterile fashion.Dark red drainage observed in the tubing now.  Pt had no complains.

## 2021-09-10 DIAGNOSIS — R338 Other retention of urine: Secondary | ICD-10-CM | POA: Diagnosis not present

## 2021-09-10 DIAGNOSIS — G928 Other toxic encephalopathy: Secondary | ICD-10-CM | POA: Diagnosis not present

## 2021-09-10 DIAGNOSIS — G9341 Metabolic encephalopathy: Secondary | ICD-10-CM | POA: Diagnosis not present

## 2021-09-10 DIAGNOSIS — N401 Enlarged prostate with lower urinary tract symptoms: Secondary | ICD-10-CM | POA: Diagnosis not present

## 2021-09-10 LAB — GLUCOSE, CAPILLARY
Glucose-Capillary: 101 mg/dL — ABNORMAL HIGH (ref 70–99)
Glucose-Capillary: 107 mg/dL — ABNORMAL HIGH (ref 70–99)
Glucose-Capillary: 108 mg/dL — ABNORMAL HIGH (ref 70–99)
Glucose-Capillary: 121 mg/dL — ABNORMAL HIGH (ref 70–99)
Glucose-Capillary: 128 mg/dL — ABNORMAL HIGH (ref 70–99)
Glucose-Capillary: 128 mg/dL — ABNORMAL HIGH (ref 70–99)

## 2021-09-10 MED ORDER — SERTRALINE HCL 100 MG PO TABS
100.0000 mg | ORAL_TABLET | Freq: Every day | ORAL | Status: DC
  Administered 2021-09-11 – 2021-09-17 (×7): 100 mg via ORAL
  Filled 2021-09-10 (×7): qty 1

## 2021-09-10 NOTE — Progress Notes (Signed)
Patient was asked if he would to attempt suicide again. Patient states " I didn't have enough the first time I am a big burden and everyone would be better off if I wasn't here. I have a marker and they need to just dig a hole or they could just put me in a jar."

## 2021-09-10 NOTE — Consult Note (Signed)
[facility] Face-to-Face Psychiatry Consult   Reason for Consult: ? Concern for intentional overdose/suicide 88 year old male admitted with encephalopathy secondary to presumed intentional opioid overdose. Family found empty bottle of Dilaudid.'' Referring Physician:  [place] Eric, DO Patient Identification: [name] MRN:  [id number] Principal Diagnosis: Toxic metabolic encephalopathy Diagnosis:  Principal Problem:   Toxic metabolic encephalopathy Active Problems:   HLD (hyperlipidemia)   Essential hypertension   Coronary atherosclerosis   BPH (benign prostatic hyperplasia)   Drug overdose, intentional, initial encounter ([place])   Acute metabolic encephalopathy   Major depressive disorder, single episode, severe ([place])   Acute urinary retention   Total Time spent with patient: 30 minutes  Subjective:   [name] is a 88 y.o. male patient admitted with intentional overdose.  HPI:  [name] is a 88 y.o. male with past medical history significant for HTN, HLD, CAD, history of choledocholithiasis, history of laparoscopic cholecystectomy, BPH, overactive bladder, OSA not on CPAP, cognitive impairment, Anxiety/depression, Leukemia who presented to [facility] ED on 8/18 via EMS after patient was found with increased lethargy and confusion by his daughter.   On evaluation the patient reported: Patient states that he feels better.  States that he is  no longer hallucinating and his mind seems to be returning back to normal. Patient is very circumstantial in his thoughts today as he talks about his past life, wife, suicide attempt and dog.  He tells me he is eating and sleeping without difficulty.  He is also tolerating his medications without adverse reactions and or side effects.  He does appear to be future oriented and seeking inpatient psychiatric services, and now appears remorseful for his suicide attempt.  He is able to verbalize his wrongdoing and understand the impact it has on others.   Although he does continue to state he wishes at times he was successful in his attempt " I am almost 88 years old.  I have my grave site.  I have my tombstone there.  My wife is there.  I just need to be on the other side of her.  I did not mean to hurt anybody, and I wish my daughter would have just showed up a little later. "  At this time patient denies suicidal/self harming thoughts and psychosis.   All questions, comments, concerns, have been addressed and both verbalized understanding.  Both daughters [name] and [name] are at the bedside, have been updated regarding ongoing search for inpatient geriatric psych hospital.  Daughter [name] does not agree with this decision, and is refusing to allow him to go out of system.  She is aware that her father is currently under involuntary commitment for these reasons listed above.  Past Psychiatric History: depression  Risk to Self:  yes, unable to contract for safety Risk to Others:  not known Prior Inpatient Therapy:  none Prior Outpatient Therapy:  none, receives Zoloft from PCP  Past Medical History:  Past Medical History:  Diagnosis Date   Arthralgia of multiple joints    limited mobility w/  independant adl's   BPH with obstruction/lower urinary tract symptoms    Chronic idiopathic monocytosis    followed by dr Waymon Budge--  per lov note 06/ 2017 persistant unexplained  with normal bone barrow bx   Coronary atherosclerosis of unspecified type of vessel, native or graft    cardiologist-  dr Stanford Breed -- per lov note 11-19-2014 ,  cardiac cath in 2000-- pLAD 60-70%,  small intermediate branch 70-80%,  mRCA 20%,  normal LV   Degenerative arthritis of spine    cervical and lumar   Diverticulosis of colon (without mention of hemorrhage)    Dysuria    chronic   Elevated PSA    Feeling of incomplete bladder emptying    Hiatal hernia    moderate per ct 11/ 2017   History of adenomatous polyp of colon    2008   History of atrial fibrillation     remote hx episode   History of COVID-19 03/05/2020   History of pancreatitis    07-01-2013  and 10-10-2015   History of prostatitis    2014   Hx of colonic polyps    Hyperlipidemia    Hypertension    Mouth sore    roof of mouth sore    Nephrolithiasis    Nocturia more than twice per night    severe  w/ leakage   OSA (obstructive sleep apnea)    INTOLERANT CPAP   Osteoarthritis    Pernicious anemia    B12  Def.   Peyronie disease    Renal insufficiency    Sepsis ([place]) 03/22/2017   Thrombocytopenia, unspecified ([place]) hemotology/oncologist-  dr Waymon Budge   per dr Velta Addison note 06/ 2016  secondary to vitro clumping   Vitamin B deficiency     Past Surgical History:  Procedure Laterality Date   BONE MARROW BIOPSY  2011   normal   CARDIAC CATHETERIZATION  2000   per dr Kathyrn Drown note --  dLAD 60-70%,  small intermediate branch 70-80%,  mRCA 20%,  normal LV   CARDIOVASCULAR STRESS TEST  01-29-2013   dr Stanford Breed   normal nuclear study w/ no ischemia,  normal LV function and wall motion , ef 82%   CATARACT EXTRACTION W/ INTRAOCULAR LENS  IMPLANT, BILATERAL  08/2015   CHOLECYSTECTOMY  11/24/2010   Procedure: LAPAROSCOPIC CHOLECYSTECTOMY WITH INTRAOPERATIVE CHOLANGIOGRAM;  Surgeon: Earnstine Regal, MD;  Location: [facility];  Service: General;  Laterality: N/A;  c-arm   CYSTOSCOPY W/ URETERAL STENT PLACEMENT Left 12/17/2015   Procedure: CYSTOSCOPY WITH RETROGRADE PYELOGRAM/URETERAL STENT PLACEMENT;  Surgeon: Ardis Hughs, MD;  Location: [facility];  Service: Urology;  Laterality: Left;   CYSTOSCOPY WITH RETROGRADE PYELOGRAM, URETEROSCOPY AND STENT PLACEMENT Left 12/22/2015   Procedure: CYSTOSCOPY WITH LEFT RETROGRADE  URETEROSCOPY AND STENT PLACEMENT;  Surgeon: Carolan Clines, MD;  Location: [place];  Service: Urology;  Laterality: Left;   ERCP N/A 07/04/2013   Procedure: ENDOSCOPIC RETROGRADE CHOLANGIOPANCREATOGRAPHY (ERCP);  Surgeon: Gatha Mayer, MD;   Location: [facility];  Service: Endoscopy;  Laterality: N/A;  MAC if available   ERCP N/A 10/12/2015   Procedure: ENDOSCOPIC RETROGRADE CHOLANGIOPANCREATOGRAPHY (ERCP);  Surgeon: [name], MD;  Location: [facility];  Service: Endoscopy;  Laterality: N/A;   HOLMIUM LASER APPLICATION Left 44/0/3474   Procedure: HOLMIUM LASER APPLICATION;  Surgeon: Carolan Clines, MD;  Location: [facility];  Service: Urology;  Laterality: Left;   INGUINAL HERNIA REPAIR Right 01/25/2002   KNEE ARTHROSCOPY  x5   SATURATION BIOPSY OF PROSTATE  05-29-2007  and 01-26-2008   SHOULDER ARTHROSCOPY WITH OPEN ROTATOR CUFF REPAIR AND DISTAL CLAVICLE ACROMINECTOMY Right 11/19/2004   TOTAL HIP ARTHROPLASTY Right 05/21/2009   TOTAL KNEE ARTHROPLASTY Bilateral left 05-02-2003/  right  03-23-2010   TRANSTHORACIC ECHOCARDIOGRAM  12/23/2004   ef 60%, mild MV calcification without stenosis/  mild TR,  PASP 65mHg   UVULOPALATOPHARYNGOPLASTY  1993    w/  T &  A   Family History:  Family History  Problem Relation Age of Onset   Peripheral vascular disease Mother    Colon cancer Father 39   Breast cancer Daughter    Breast cancer Daughter    Family Psychiatric  History:  Social History:  Social History   Substance and Sexual Activity  Alcohol Use No   Alcohol/week: 0.0 standard drinks of alcohol     Social History   Substance and Sexual Activity  Drug Use No    Social History   Socioeconomic History   Marital status: Widowed    Spouse name: Not on file   Number of children: 2   Years of education: Not on file   Highest education level: Not on file  Occupational History   Occupation: retired    Fish farm manager: RETIRED  Tobacco Use   Smoking status: Former    Types: Pipe    Quit date: 01/18/1966    Years since quitting: 55.6   Smokeless tobacco: Never  Vaping Use   Vaping Use: Never used  Substance and Sexual Activity   Alcohol use: No    Alcohol/week: 0.0 standard drinks of alcohol    Drug use: No   Sexual activity: Not Currently  Other Topics Concern   Not on file  Social History Narrative   DNR   Widowed   2 daughters leave near by   Social Determinants of Health   Financial Resource Strain: Low Risk  (08/21/2021)   Overall Financial Resource Strain (CARDIA)    Difficulty of Paying Living Expenses: Not hard at all  Food Insecurity: No Food Insecurity (08/21/2021)   Hunger Vital Sign    Worried About Running Out of Food in the Last Year: Never true    [place] in the Last Year: Never true  Transportation Needs: No Transportation Needs (08/21/2021)   PRAPARE - Hydrologist (Medical): No    Lack of Transportation (Non-Medical): No  Physical Activity: Inactive (08/21/2021)   Exercise Vital Sign    Days of Exercise per Week: 0 days    Minutes of Exercise per Session: 0 min  Stress: Stress Concern Present (08/21/2021)   [place]    Feeling of Stress : To some extent  Social Connections: Not on file   Additional Social History:    Allergies:   Allergies  Allergen Reactions   Penicillins Hives and Other (See Comments)    [place], passed out Tolerates cephalosporins  Has patient had a PCN reaction causing immediate rash, facial/tongue/throat swelling, SOB or lightheadedness with hypotension:  yes Has patient had a PCN reaction causing severe rash involving mucus membranes or skin necrosis: no Has patient had a PCN reaction that required hospitalization: no Has patient had a PCN reaction occurring within the last 10 years: no If all of the above answers are "NO", then may proceed with Cephalosporin use.    Tape Hives and Other (See Comments)    Paper Tape    Labs:  Results for orders placed or performed during the hospital encounter of 09/04/21 (from the past 48 hour(s))  Glucose, capillary     Status: Abnormal   Collection Time: 09/08/21  4:03 PM  Result  Value Ref Range   Glucose-Capillary 108 (H) 70 - 99 mg/dL    Comment: Glucose reference range applies only to samples taken after fasting for at least 8 hours.  Glucose, capillary  Status: Abnormal   Collection Time: 09/08/21  8:25 PM  Result Value Ref Range   Glucose-Capillary 125 (H) 70 - 99 mg/dL    Comment: Glucose reference range applies only to samples taken after fasting for at least 8 hours.  Glucose, capillary     Status: Abnormal   Collection Time: 09/08/21 11:56 PM  Result Value Ref Range   Glucose-Capillary 135 (H) 70 - 99 mg/dL    Comment: Glucose reference range applies only to samples taken after fasting for at least 8 hours.  Glucose, capillary     Status: Abnormal   Collection Time: 09/09/21  4:11 AM  Result Value Ref Range   Glucose-Capillary 104 (H) 70 - 99 mg/dL    Comment: Glucose reference range applies only to samples taken after fasting for at least 8 hours.   Comment 1 Notify RN    Comment 2 Document in Chart   Basic metabolic panel     Status: Abnormal   Collection Time: 09/09/21  5:29 AM  Result Value Ref Range   Sodium 143 135 - 145 mmol/L   Potassium 3.5 3.5 - 5.1 mmol/L   Chloride 116 (H) 98 - 111 mmol/L   CO2 23 22 - 32 mmol/L   Glucose, Bld 105 (H) 70 - 99 mg/dL    Comment: Glucose reference range applies only to samples taken after fasting for at least 8 hours.   BUN 8 8 - 23 mg/dL   Creatinine, Ser 0.69 0.61 - 1.24 mg/dL   Calcium 8.2 (L) 8.9 - 10.3 mg/dL   GFR, Estimated >60 >60 mL/min    Comment: (NOTE) Calculated using the CKD-EPI Creatinine Equation (2021)    Anion gap 4 (L) 5 - 15    Comment: Performed at [facility], [place] [address] [place], [place] [address]  CBC     Status: Abnormal   Collection Time: 09/09/21  5:29 AM  Result Value Ref Range   WBC 15.5 (H) 4.0 - 10.5 K/uL   RBC 4.47 4.22 - 5.81 MIL/uL   Hemoglobin 12.0 (L) 13.0 - 17.0 g/dL   HCT 39.2 39.0 - 52.0 %   MCV 87.7 80.0 - 100.0 fL   MCH 26.8 26.0  - 34.0 pg   MCHC 30.6 30.0 - 36.0 g/dL   RDW 15.3 11.5 - 15.5 %   Platelets 54 (L) 150 - 400 K/uL    Comment: SPECIMEN CHECKED FOR CLOTS Immature Platelet Fraction may be clinically indicated, consider ordering this additional test [id number] REPEATED TO VERIFY PLATELET COUNT CONFIRMED BY SMEAR    nRBC 0.0 0.0 - 0.2 %    Comment: Performed at [facility], [place] [address], [place], [place] [address]  Magnesium     Status: None   Collection Time: 09/09/21  5:29 AM  Result Value Ref Range   Magnesium 1.9 1.7 - 2.4 mg/dL    Comment: Performed at [facility], [place] [address], [place], [place] [address]  Glucose, capillary     Status: Abnormal   Collection Time: 09/09/21  7:46 AM  Result Value Ref Range   Glucose-Capillary 100 (H) 70 - 99 mg/dL    Comment: Glucose reference range applies only to samples taken after fasting for at least 8 hours.  Glucose, capillary     Status: Abnormal   Collection Time: 09/09/21 11:36 AM  Result Value Ref Range   Glucose-Capillary 120 (H) 70 - 99 mg/dL    Comment: Glucose reference range applies  only to samples taken after fasting for at least 8 hours.  Glucose, capillary     Status: Abnormal   Collection Time: 09/09/21  5:06 PM  Result Value Ref Range   Glucose-Capillary 100 (H) 70 - 99 mg/dL    Comment: Glucose reference range applies only to samples taken after fasting for at least 8 hours.  Glucose, capillary     Status: Abnormal   Collection Time: 09/09/21  7:41 PM  Result Value Ref Range   Glucose-Capillary 163 (H) 70 - 99 mg/dL    Comment: Glucose reference range applies only to samples taken after fasting for at least 8 hours.  Glucose, capillary     Status: Abnormal   Collection Time: 09/10/21 12:20 AM  Result Value Ref Range   Glucose-Capillary 108 (H) 70 - 99 mg/dL    Comment: Glucose reference range applies only to samples taken after fasting for at least 8 hours.  Glucose, capillary     Status:  Abnormal   Collection Time: 09/10/21  3:40 AM  Result Value Ref Range   Glucose-Capillary 107 (H) 70 - 99 mg/dL    Comment: Glucose reference range applies only to samples taken after fasting for at least 8 hours.  Glucose, capillary     Status: Abnormal   Collection Time: 09/10/21  7:52 AM  Result Value Ref Range   Glucose-Capillary 101 (H) 70 - 99 mg/dL    Comment: Glucose reference range applies only to samples taken after fasting for at least 8 hours.  Glucose, capillary     Status: Abnormal   Collection Time: 09/10/21 12:34 PM  Result Value Ref Range   Glucose-Capillary 128 (H) 70 - 99 mg/dL    Comment: Glucose reference range applies only to samples taken after fasting for at least 8 hours.    Current Facility-Administered Medications  Medication Dose Route Frequency Provider Last Rate Last Admin   acetaminophen (TYLENOL) tablet 650 mg  650 mg Oral Q6H PRN [place] Eric J, DO       Or   acetaminophen (TYLENOL) suppository 650 mg  650 mg Rectal Q6H PRN [place] Eric J, DO   650 mg at 09/04/21 2313   albuterol (PROVENTIL) (2.5 MG/3ML) 0.083% nebulizer solution 2.5 mg  2.5 mg Nebulization Q2H PRN [place] Eric J, DO       Chlorhexidine Gluconate Cloth 2 % PADS 6 each  6 each Topical Daily [place] Eric J, DO   6 each at 09/10/21 1001   ciprofloxacin-dexamethasone (CIPRODEX) 0.3-0.1 % OTIC (EAR) suspension 3 drop  3 drop Left EAR TID Dimple Nanas, RPH   3 drop at 09/10/21 1508   feeding supplement (BOOST / RESOURCE BREEZE) liquid 1 Container  1 Container Oral TID BM [place] Donnamarie Poag, DO   1 Container at 09/10/21 1345   finasteride (PROSCAR) tablet 5 mg  5 mg Oral Daily [place] Donnamarie Poag, DO   5 mg at 09/10/21 0959   fluticasone (FLONASE) 50 MCG/ACT nasal spray 1 spray  1 spray Each Nare BID [place] Eric J, DO   1 spray at 09/10/21 1005   gabapentin (NEURONTIN) capsule 100 mg  100 mg Oral BID Suella Broad, FNP   100 mg at 09/10/21 0959   loratadine (CLARITIN) tablet 10 mg   10 mg Oral Daily [place] Eric J, DO   10 mg at 09/10/21 1000   LORazepam (ATIVAN) tablet 0.5 mg  0.5 mg Oral Q6H PRN [place] Eric J, DO   0.5 mg at 09/08/21  1214   melatonin tablet 3 mg  3 mg Oral QHS [place] Donnamarie Poag, DO   3 mg at 09/09/21 2108   naloxone [facility]) injection 0.4 mg  0.4 mg Intravenous PRN [place] Donnamarie Poag, DO       ondansetron [facility]) tablet 4 mg  4 mg Oral Q6H PRN [place] Donnamarie Poag, DO       Or   ondansetron [facility]) injection 4 mg  4 mg Intravenous Q6H PRN [place] Donnamarie Poag, DO   4 mg at 09/06/21 1906   polyethylene glycol (MIRALAX / GLYCOLAX) packet 17 g  17 g Oral Daily PRN [place] Eric J, DO       polyvinyl alcohol (LIQUIFILM TEARS) 1.4 % ophthalmic solution 1 drop  1 drop Both Eyes PRN [place] Donnamarie Poag, DO   1 drop at 09/09/21 2952   sertraline (ZOLOFT) tablet 75 mg  75 mg Oral Daily [place] Eric J, DO   75 mg at 09/10/21 8413   tamsulosin (FLOMAX) capsule 0.8 mg  0.8 mg Oral Daily [place] Eric J, DO   0.8 mg at 09/10/21 1000   traZODone (DESYREL) tablet 50 mg  50 mg Oral QHS [place] Eric J, DO   50 mg at 09/09/21 2108    Musculoskeletal: Strength & Muscle Tone: within normal limits Gait & Station: unsteady Patient leans: N/A    Psychiatric Specialty Exam:  Presentation  General Appearance: Appropriate for Environment; Casual  Eye Contact:Fair  Speech:Clear and Coherent; Normal Rate  Speech Volume:Normal  Handedness:Right   Mood and Affect  Mood:Dysphoric  Affect:Appropriate; Congruent   Thought Process  Thought Processes:Linear; Coherent  Descriptions of Associations:Intact  Orientation:Full (Time, Place and Person)  Thought Content:Logical  History of Schizophrenia/Schizoaffective disorder:No data recorded Duration of Psychotic Symptoms:No data recorded Hallucinations:Hallucinations: None  Ideas of Reference:None  Suicidal Thoughts:Suicidal Thoughts: Yes, Passive SI Passive Intent and/or Plan: With Intent; Without Plan; Without Means  to Carry Out; Without Access to Means  Homicidal Thoughts:Homicidal Thoughts: No   Sensorium  Memory:Immediate Fair; Recent Poor; Remote Fair  Judgment:Poor  Insight:Poor   Executive Functions  Concentration:Fair  Attention Span:Fair  Recall:Poor  Fund of Knowledge:Poor  Language:Good   Psychomotor Activity  Psychomotor Activity:No data recorded   Assets  Assets:Social Support   Sleep  Sleep:No data recorded   Physical Exam: Physical Exam Review of Systems  Psychiatric/Behavioral:  Positive for depression and suicidal ideas. The patient is nervous/anxious.    Blood pressure (!) 108/54, pulse 92, temperature 98.3 F (36.8 C), temperature source Oral, resp. rate 20, height 5' (1.524 m), weight 59.2 kg, SpO2 96 %. Body mass index is 25.49 kg/m.  Treatment Plan Summary: 88 year old male with multiple medical problems with worsening depressive symptoms. He was admitted to the hospital after he intentionally attempted suicide by overdosing on medication. Today, patient is unable to contract for safety, he will benefit from psychiatric inpatient admission after he is medically stabilized.  Plan/Recommendations: -Continue 1:1 sitter for safety -increase Zoloft 100 mg p.o. daily for depression and suicidal thoughts. -Will continue gabapentin 100 mg p.o. twice daily for anxiety, mood, and GABA related effects to include pain management. -Consider social worker consult to facilitate geriatric psychiatric inpatient admission after pt is medically stabilized. Continue IVC, as patient remains high risk for suicide completion, inability to contract for safety, and endorses ongoing active suicidal ideations. -Recommend palliative medicine consult to establish goals of care -Initiate delirium precautions.   Disposition: Recommend psychiatric Inpatient admission when medically cleared. Supportive therapy provided  about ongoing stressors. Psychiatric service will follow the  patient in order to monitor his progress  Suella Broad, FNP 09/10/2021 3:42 PM

## 2021-09-10 NOTE — Progress Notes (Signed)
PROGRESS NOTE    ATREYU MAK  OVZ:[id number] DOB: [birth year] DOA: 09/04/2021 PCP: Pleas Koch, NP   Brief Narrative:  88 y.o. male with past medical history significant for HTN, HLD, CAD, choledocholithiasis, laparoscopic cholecystectomy, BPH, overactive bladder, OSA not on CPAP, cognitive impairment, anxiety/depression, Leukemia presented on 09/04/2021 with increased lethargy and confusion, hallucinations with possible suicidal ideation.  Patient's daughter found empty Dilaudid container at home, which was apparently his wife's previous medication (wife passed away 2 years ago from pancreatic cancer).  There was a concern for intentional overdose.  On presentation, CT of the head without contrast was without acute intracranial abnormity.  Chest x-ray showed no acute cardiopulmonary disease process.  Patient was IVC'd.  Psychiatry was consulted.  Psychiatry recommended transfer to [facility] facility.  Assessment & Plan:   Acute metabolic/toxic encephalopathy secondary to possible intentional opiate overdose Suicide attempt, continued ideations Anxiety/depression -Psychiatry following.  Currently IVCD'd.  Psychiatry recommending transfer to [place] facility.  Social worker following. -Continue suicide precautions, one-to-one sitter. -Currently on trazodone, sertraline, gabapentin as per psychiatry -Continue as needed Ativan for agitation -Currently medically stable for discharge  Aspiration pneumonia -Status post 5 days of antibiotics, antibiotics discontinued on 09/09/2021 -Currently on room air.  Diet as per SLP recommendations.  Leukocytosis -Blood cultures negative so far.  Urine culture grew Staph epidermidis: Likely contaminant -No labs today.  Acute urinary retention Hematuria BPH -Hematuria likely secondary to traumatic Foley placement in the ED.  Attempted to discontinue Foley catheter with continued urinary retention and hematuria.  Urology consulted and patient  underwent coud catheter placement by Dr. Alyson Ingles on 09/05/2021. -Continue Flomax and finasteride. -Continue Foley catheter: Flush as needed for clots.  Outpatient follow-up with urology.  On 09/06/2021: Urology had recommended that Foley should remain in place for 5 to 7 days prior to attempting a voiding trial.   Left otitis externa -Continue Ciprodex eardrops  CAD/hypertension/hyperlipidemia -Currently not on any medication at this time.  Outpatient follow-up with PCP   DVT prophylaxis: SCDs Code Status: DNR Family Communication: Daughter at bedside Disposition Plan: Status is: Inpatient Remains inpatient appropriate because: Of need for [facility] psych placement.  Currently medically stable for discharge    Consultants: Psychiatry/urology  Procedures: None  Antimicrobials:  Anti-infectives (From admission, onward)    Start     Dose/Rate Route Frequency Ordered Stop   09/05/21 1115  cefTRIAXone (ROCEPHIN) 2 g in sodium chloride 0.9 % 100 mL IVPB        2 g 200 mL/hr over 30 Minutes Intravenous Every 24 hours 09/05/21 1019 09/09/21 1203   09/05/21 1115  metroNIDAZOLE (FLAGYL) IVPB 500 mg  Status:  Discontinued        500 mg 100 mL/hr over 60 Minutes Intravenous Every 12 hours 09/05/21 1019 09/09/21 1418        Subjective: Patient seen and examined at bedside.  Hard of hearing.  No agitation, seizures, vomiting or fever reported.  Objective: Vitals:   09/08/21 2146 09/09/21 1235 09/09/21 2144 09/10/21 0347  BP: (!) 123/57 119/60 (!) 104/53 122/63  Pulse: (!) 101 (!) 101 99 82  Resp: '16 18 18 18  '$ Temp: 98.2 F (36.8 C) 97.9 F (36.6 C) 98.6 F (37 C) 98 F (36.7 C)  TempSrc: Oral Oral Oral Oral  SpO2: 95% 98% 96% 94%  Weight:      Height:        Intake/Output Summary (Last 24 hours) at 09/10/2021 0801 Last data filed at 09/10/2021 0700  Gross per 24 hour  Intake 478 ml  Output 1775 ml  Net -1297 ml    Filed Weights   09/07/21 1151  Weight: 59.2 kg     Examination:  General: On room air.  No distress.  Extremely hard of hearing.  Looks chronically ill and deconditioned. ENT/neck: No thyromegaly.  JVD is not elevated  respiratory: Decreased breath sounds at bases bilaterally with some crackles; no wheezing  CVS: S1-S2 heard, mildly tachycardic intermittently  abdominal: Soft, nontender, slightly distended; no organomegaly, bowel sounds are heard Extremities: Trace lower extremity edema; no cyanosis  CNS: Alert, slow to respond, extremely poor historian.  No focal neurologic deficit.  Moves extremities Lymph: No obvious lymphadenopathy Skin: No obvious ecchymosis/lesions  psych: Not agitated.  Affect is flat. Genitourinary: Indwelling Foley catheter present    Data Reviewed: I have personally reviewed following labs and imaging studies  CBC: Recent Labs  Lab 09/04/21 1341 09/05/21 0901 09/06/21 0442 09/07/21 0446 09/08/21 0452 09/09/21 0529  WBC 14.5* 51.5* 23.7* 13.8* 14.9* 15.5*  NEUTROABS 9.7*  --  17.9* 9.5* 10.2*  --   HGB 14.8 12.8* 10.5* 10.5* 11.6* 12.0*  HCT 47.6 41.7 33.1* 33.7* 36.9* 39.2  MCV 87.2 88.7 87.1 86.9 85.8 87.7  PLT 56* 55* 41* 36* 44* 54*    Basic Metabolic Panel: Recent Labs  Lab 09/05/21 0513 09/06/21 0442 09/07/21 0446 09/08/21 0452 09/09/21 0529  NA 142 140 143 140 143  K 4.4 3.7 3.5 3.4* 3.5  CL 112* 114* 120* 115* 116*  CO2 25 23 21* 20* 23  GLUCOSE 149* 129* 101* 95 105*  BUN '17 18 11 9 8  '$ CREATININE 1.30* 0.83 0.73 0.71 0.69  CALCIUM 8.3* 7.6* 7.6* 8.1* 8.2*  MG 1.9  --   --   --  1.9  PHOS 3.4  --   --   --   --     GFR: Estimated Creatinine Clearance: 47.7 mL/min (by C-G formula based on SCr of 0.69 mg/dL). Liver Function Tests: Recent Labs  Lab 09/04/21 1341  AST 15  ALT 13  ALKPHOS 76  BILITOT 0.8  PROT 5.9*  ALBUMIN 4.1    No results for input(s): "LIPASE", "AMYLASE" in the last 168 hours. No results for input(s): "AMMONIA" in the last 168  hours. Coagulation Profile: No results for input(s): "INR", "PROTIME" in the last 168 hours. Cardiac Enzymes: No results for input(s): "CKTOTAL", "CKMB", "CKMBINDEX", "TROPONINI" in the last 168 hours. BNP (last 3 results) No results for input(s): "PROBNP" in the last 8760 hours. HbA1C: No results for input(s): "HGBA1C" in the last 72 hours. CBG: Recent Labs  Lab 09/09/21 1706 09/09/21 1941 09/10/21 0020 09/10/21 0340 09/10/21 0752  GLUCAP 100* 163* 108* 107* 101*    Lipid Profile: No results for input(s): "CHOL", "HDL", "LDLCALC", "TRIG", "CHOLHDL", "LDLDIRECT" in the last 72 hours. Thyroid Function Tests: No results for input(s): "TSH", "T4TOTAL", "FREET4", "T3FREE", "THYROIDAB" in the last 72 hours. Anemia Panel: No results for input(s): "VITAMINB12", "FOLATE", "FERRITIN", "TIBC", "IRON", "RETICCTPCT" in the last 72 hours. Sepsis Labs: Recent Labs  Lab 09/04/21 1342 09/04/21 1705 09/05/21 0513  LATICACIDVEN 2.1* 1.4 1.6     Recent Results (from the past 240 hour(s))  Culture, blood (routine x 2)     Status: None   Collection Time: 09/04/21 12:41 PM   Specimen: BLOOD  Result Value Ref Range Status   Specimen Description   Final    BLOOD LEFT ANTECUBITAL Performed at [facility], [place] [address], [place], [place] [address]    Special Requests   Final    BOTTLES DRAWN AEROBIC AND ANAEROBIC Blood Culture adequate volume Performed at [facility] [address], [place], [place] [address]    Culture   Final    NO GROWTH 5 DAYS Performed at [facility], [place] [address], [place], [place] [address]    Report Status 09/09/2021 FINAL  Final  Resp Panel by RT-PCR (Flu A&B, Covid)     Status: None   Collection Time: 09/04/21  1:46 PM   Specimen: Nasal Swab  Result Value Ref Range Status   SARS Coronavirus 2 by RT PCR NEGATIVE NEGATIVE Final    Comment: (NOTE) SARS-CoV-2 target nucleic acids are NOT DETECTED.  The  SARS-CoV-2 RNA is generally detectable in upper respiratory specimens during the acute phase of infection. The lowest concentration of SARS-CoV-2 viral copies this assay can detect is 138 copies/mL. A negative result does not preclude SARS-Cov-2 infection and should not be used as the sole basis for treatment or other patient management decisions. A negative result may occur with  improper specimen collection/handling, submission of specimen other than nasopharyngeal swab, presence of viral mutation(s) within the areas targeted by this assay, and inadequate number of viral copies(<138 copies/mL). A negative result must be combined with clinical observations, patient history, and epidemiological information. The expected result is Negative.  Fact Sheet for Patients:  EntrepreneurPulse.com.au  Fact Sheet for Healthcare Providers:  IncredibleEmployment.be  This test is no t yet approved or cleared by the [place] FDA and  has been authorized for detection and/or diagnosis of SARS-CoV-2 by FDA under an Emergency Use Authorization (EUA). This EUA will remain  in effect (meaning this test can be used) for the duration of the COVID-19 declaration under Section 564(b)(1) of the Act, 21 U.S.C.section 360bbb-3(b)(1), unless the authorization is terminated  or revoked sooner.       Influenza A by PCR NEGATIVE NEGATIVE Final   Influenza B by PCR NEGATIVE NEGATIVE Final    Comment: (NOTE) The Xpert Xpress SARS-CoV-2/FLU/RSV plus assay is intended as an aid in the diagnosis of influenza from Nasopharyngeal swab specimens and should not be used as a sole basis for treatment. Nasal washings and aspirates are unacceptable for Xpert Xpress SARS-CoV-2/FLU/RSV testing.  Fact Sheet for Patients: EntrepreneurPulse.com.au  Fact Sheet for Healthcare Providers: IncredibleEmployment.be  This test is not yet approved or  cleared by the [place] FDA and has been authorized for detection and/or diagnosis of SARS-CoV-2 by FDA under an Emergency Use Authorization (EUA). This EUA will remain in effect (meaning this test can be used) for the duration of the COVID-19 declaration under Section 564(b)(1) of the Act, 21 U.S.C. section 360bbb-3(b)(1), unless the authorization is terminated or revoked.  Performed at [facility], [place] [address] [place], [place] [address]   Culture, blood (routine x 2)     Status: None   Collection Time: 09/04/21  2:00 PM   Specimen: BLOOD  Result Value Ref Range Status   Specimen Description   Final    BLOOD SITE NOT SPECIFIED Performed at [facility] [address] [place], [place] [address]    Special Requests   Final    BOTTLES DRAWN AEROBIC AND ANAEROBIC Blood Culture results may not be optimal due to an excessive volume of blood received in culture bottles Performed at [place] [address], [place], [place] [address]  Culture   Final    NO GROWTH 5 DAYS Performed at [facility], [place] [address], [place], [place] [address]    Report Status 09/09/2021 FINAL  Final  Urine Culture     Status: Abnormal   Collection Time: 09/04/21  5:03 PM   Specimen: Urine, Catheterized  Result Value Ref Range Status   Specimen Description   Final    URINE, CATHETERIZED Performed at [facility] [address] [place], [place] [address]    Special Requests   Final    NONE Performed at [facility], [place] [address], [place], [place] [address]    Culture (A)  Final    1,000 COLONIES/mL STAPHYLOCOCCUS EPIDERMIDIS CALL MICROBIOLOGY LAB IF SENSITIVITIES ARE REQUIRED. Performed at [facility], [place] [address] [place], [place] [address]    Report Status 09/06/2021 FINAL  Final         Radiology Studies: No results found.      Scheduled Meds:   Chlorhexidine Gluconate Cloth  6 each Topical Daily   ciprofloxacin-dexamethasone  3 drop Left EAR TID   feeding supplement  1 Container Oral TID BM   finasteride  5 mg Oral Daily   fluticasone  1 spray Each Nare BID   gabapentin  100 mg Oral BID   loratadine  10 mg Oral Daily   melatonin  3 mg Oral QHS   sertraline  75 mg Oral Daily   tamsulosin  0.8 mg Oral Daily   traZODone  50 mg Oral QHS   Continuous Infusions:          Aline August, MD [facility] 09/10/2021, 8:01 AM

## 2021-09-10 NOTE — Progress Notes (Signed)
Mobility Specialist - Progress Note     09/10/21 1608  Mobility  Activity Ambulated with assistance in hallway  Range of Motion/Exercises Active  Level of Assistance Contact guard assist, steadying assist  Assistive Device Front wheel walker  Distance Ambulated (ft) 250 ft  Activity Response Tolerated well  $Mobility charge 1 Mobility   Pt was found in bed and agreeable to mobilize. Had no complaints and returned back to bed with all necessities in reach.  Ferd Hibbs Mobility Specialist

## 2021-09-11 DIAGNOSIS — R338 Other retention of urine: Secondary | ICD-10-CM | POA: Diagnosis not present

## 2021-09-11 DIAGNOSIS — T50902A Poisoning by unspecified drugs, medicaments and biological substances, intentional self-harm, initial encounter: Secondary | ICD-10-CM | POA: Diagnosis not present

## 2021-09-11 DIAGNOSIS — G928 Other toxic encephalopathy: Secondary | ICD-10-CM | POA: Diagnosis not present

## 2021-09-11 DIAGNOSIS — I10 Essential (primary) hypertension: Secondary | ICD-10-CM | POA: Diagnosis not present

## 2021-09-11 LAB — GLUCOSE, CAPILLARY
Glucose-Capillary: 102 mg/dL — ABNORMAL HIGH (ref 70–99)
Glucose-Capillary: 116 mg/dL — ABNORMAL HIGH (ref 70–99)
Glucose-Capillary: 117 mg/dL — ABNORMAL HIGH (ref 70–99)
Glucose-Capillary: 117 mg/dL — ABNORMAL HIGH (ref 70–99)
Glucose-Capillary: 136 mg/dL — ABNORMAL HIGH (ref 70–99)
Glucose-Capillary: 91 mg/dL (ref 70–99)

## 2021-09-11 NOTE — Progress Notes (Signed)
Swallow eval order received, thank you. Will see pt for evaluation as soon as able.   Kathleen Lime, MS [facility] SLP Acute Rehab Services Office [phone] Pager [phone]

## 2021-09-11 NOTE — Progress Notes (Signed)
PROGRESS NOTE    [name]  SWH:[id number] DOB: 03-15-1933 DOA: 09/04/2021 PCP: Pleas Koch, NP   Brief Narrative:  88 y.o. male with past medical history significant for HTN, HLD, CAD, choledocholithiasis, laparoscopic cholecystectomy, BPH, overactive bladder, OSA not on CPAP, cognitive impairment, anxiety/depression, Leukemia presented on 09/04/2021 with increased lethargy and confusion, hallucinations with possible suicidal ideation.  Patient's daughter found empty Dilaudid container at home, which was apparently his wife's previous medication (wife passed away 2 years ago from pancreatic cancer).  There was a concern for intentional overdose.  On presentation, CT of the head without contrast was without acute intracranial abnormity.  Chest x-ray showed no acute cardiopulmonary disease process.  Patient was IVC'd.  Psychiatry was consulted.  Psychiatry recommended transfer to [facility] facility.  Currently medically stable for discharge.  Assessment & Plan:   Acute metabolic/toxic encephalopathy secondary to possible intentional opiate overdose Suicide attempt, continued ideations Anxiety/depression -Psychiatry following.  Currently IVCD'd.  Psychiatry recommending transfer to [facility] facility.  Social worker following. -Continue suicide precautions, one-to-one sitter. -Currently on trazodone, sertraline, gabapentin as per psychiatry -Continue as needed Ativan for agitation -Currently medically stable for discharge  Aspiration pneumonia -Status post 5 days of antibiotics, antibiotics discontinued on 09/09/2021 -Currently on room air.  Diet as per SLP recommendations.  Leukocytosis -Blood cultures negative so far.  Urine culture grew Staph epidermidis: Likely contaminant -No labs today.  Acute urinary retention Hematuria BPH -Hematuria likely secondary to traumatic Foley placement in the ED.  Attempted to discontinue Foley catheter with continued urinary retention and  hematuria.  Urology consulted and patient underwent coud catheter placement by Dr. Alyson Ingles on 09/05/2021. -Continue Flomax and finasteride. -Continue Foley catheter: Flush as needed for clots.  Outpatient follow-up with urology.  On 09/06/2021: Urology had recommended that Foley should remain in place for 5 to 7 days prior to attempting a voiding trial.  I have communicated with Dr. Wrenn/urology via secure chat: Awaiting response.  Left otitis externa -Continue Ciprodex eardrops  CAD/hypertension/hyperlipidemia -Currently not on any medication at this time.  Outpatient follow-up with PCP   DVT prophylaxis: SCDs Code Status: DNR Family Communication: Daughter/Sharon at bedside Disposition Plan: Status is: Inpatient Remains inpatient appropriate because: Of need for [facility] psych placement.  Currently medically stable for discharge    Consultants: Psychiatry/urology  Procedures: None  Antimicrobials:  Anti-infectives (From admission, onward)    Start     Dose/Rate Route Frequency Ordered Stop   09/05/21 1115  cefTRIAXone (ROCEPHIN) 2 g in sodium chloride 0.9 % 100 mL IVPB        2 g 200 mL/hr over 30 Minutes Intravenous Every 24 hours 09/05/21 1019 09/09/21 1203   09/05/21 1115  metroNIDAZOLE (FLAGYL) IVPB 500 mg  Status:  Discontinued        500 mg 100 mL/hr over 60 Minutes Intravenous Every 12 hours 09/05/21 1019 09/09/21 1418        Subjective: Patient seen and examined at bedside.  Hard of hearing.  No fever, vomiting, agitation or seizures reported. Objective: Vitals:   09/10/21 1424 09/10/21 1440 09/10/21 2000 09/11/21 0431  BP: (!) 108/54  125/66 125/61  Pulse: 92  97 60  Resp: (!) 32 '20 20 18  '$ Temp: 98.3 F (36.8 C)  99 F (37.2 C) 98.9 F (37.2 C)  TempSrc: Oral  Oral Oral  SpO2: 96%  97% 95%  Weight:      Height:        Intake/Output Summary (Last  24 hours) at 09/11/2021 0748 Last data filed at 09/11/2021 0436 Gross per 24 hour  Intake 480 ml  Output  1150 ml  Net -670 ml    Filed Weights   09/07/21 1151 09/10/21 1042  Weight: 59.2 kg 59.2 kg    Examination:  General: On room air.  No distress.  Hard of hearing.  Slow to respond.  Poor historian.  Flat affect. respiratory: Decreased breath sounds at bases bilaterally with some crackles and intermittent tachypnea CVS: Currently rate controlled; S1-S2 heard  abdominal: Soft, nontender, distended mildly, no organomegaly; normal bowel sounds are heard  extremities: Trace lower extremity edema; no clubbing.   Genitourinary: Foley catheter present.    Data Reviewed: I have personally reviewed following labs and imaging studies  CBC: Recent Labs  Lab 09/04/21 1341 09/05/21 0901 09/06/21 0442 09/07/21 0446 09/08/21 0452 09/09/21 0529  WBC 14.5* 51.5* 23.7* 13.8* 14.9* 15.5*  NEUTROABS 9.7*  --  17.9* 9.5* 10.2*  --   HGB 14.8 12.8* 10.5* 10.5* 11.6* 12.0*  HCT 47.6 41.7 33.1* 33.7* 36.9* 39.2  MCV 87.2 88.7 87.1 86.9 85.8 87.7  PLT 56* 55* 41* 36* 44* 54*    Basic Metabolic Panel: Recent Labs  Lab 09/05/21 0513 09/06/21 0442 09/07/21 0446 09/08/21 0452 09/09/21 0529  NA 142 140 143 140 143  K 4.4 3.7 3.5 3.4* 3.5  CL 112* 114* 120* 115* 116*  CO2 25 23 21* 20* 23  GLUCOSE 149* 129* 101* 95 105*  BUN '17 18 11 9 8  '$ CREATININE 1.30* 0.83 0.73 0.71 0.69  CALCIUM 8.3* 7.6* 7.6* 8.1* 8.2*  MG 1.9  --   --   --  1.9  PHOS 3.4  --   --   --   --     GFR: Estimated Creatinine Clearance: 47.7 mL/min (by C-G formula based on SCr of 0.69 mg/dL). Liver Function Tests: Recent Labs  Lab 09/04/21 1341  AST 15  ALT 13  ALKPHOS 76  BILITOT 0.8  PROT 5.9*  ALBUMIN 4.1    No results for input(s): "LIPASE", "AMYLASE" in the last 168 hours. No results for input(s): "AMMONIA" in the last 168 hours. Coagulation Profile: No results for input(s): "INR", "PROTIME" in the last 168 hours. Cardiac Enzymes: No results for input(s): "CKTOTAL", "CKMB", "CKMBINDEX", "TROPONINI"  in the last 168 hours. BNP (last 3 results) No results for input(s): "PROBNP" in the last 8760 hours. HbA1C: No results for input(s): "HGBA1C" in the last 72 hours. CBG: Recent Labs  Lab 09/10/21 1617 09/10/21 2003 09/11/21 0000 09/11/21 0427 09/11/21 0722  GLUCAP 121* 128* 117* 102* 91    Lipid Profile: No results for input(s): "CHOL", "HDL", "LDLCALC", "TRIG", "CHOLHDL", "LDLDIRECT" in the last 72 hours. Thyroid Function Tests: No results for input(s): "TSH", "T4TOTAL", "FREET4", "T3FREE", "THYROIDAB" in the last 72 hours. Anemia Panel: No results for input(s): "VITAMINB12", "FOLATE", "FERRITIN", "TIBC", "IRON", "RETICCTPCT" in the last 72 hours. Sepsis Labs: Recent Labs  Lab 09/04/21 1342 09/04/21 1705 09/05/21 0513  LATICACIDVEN 2.1* 1.4 1.6     Recent Results (from the past 240 hour(s))  Culture, blood (routine x 2)     Status: None   Collection Time: 09/04/21 12:41 PM   Specimen: BLOOD  Result Value Ref Range Status   Specimen Description   Final    BLOOD LEFT ANTECUBITAL Performed at [facility], [place] [address], [place], [place] [address]    Special Requests   Final    BOTTLES DRAWN  AEROBIC AND ANAEROBIC Blood Culture adequate volume Performed at [facility] [address] [place], [place] [address]    Culture   Final    NO GROWTH 5 DAYS Performed at [facility], [place] [address] [place], [place] [address]    Report Status 09/09/2021 FINAL  Final  Resp Panel by RT-PCR (Flu A&B, Covid)     Status: None   Collection Time: 09/04/21  1:46 PM   Specimen: Nasal Swab  Result Value Ref Range Status   SARS Coronavirus 2 by RT PCR NEGATIVE NEGATIVE Final    Comment: (NOTE) SARS-CoV-2 target nucleic acids are NOT DETECTED.  The SARS-CoV-2 RNA is generally detectable in upper respiratory specimens during the acute phase of infection. The lowest concentration of SARS-CoV-2 viral copies this assay can detect is 138  copies/mL. A negative result does not preclude SARS-Cov-2 infection and should not be used as the sole basis for treatment or other patient management decisions. A negative result may occur with  improper specimen collection/handling, submission of specimen other than nasopharyngeal swab, presence of viral mutation(s) within the areas targeted by this assay, and inadequate number of viral copies(<138 copies/mL). A negative result must be combined with clinical observations, patient history, and epidemiological information. The expected result is Negative.  Fact Sheet for Patients:  EntrepreneurPulse.com.au  Fact Sheet for Healthcare Providers:  IncredibleEmployment.be  This test is no t yet approved or cleared by the [place] FDA and  has been authorized for detection and/or diagnosis of SARS-CoV-2 by FDA under an Emergency Use Authorization (EUA). This EUA will remain  in effect (meaning this test can be used) for the duration of the COVID-19 declaration under Section 564(b)(1) of the Act, 21 U.S.C.section 360bbb-3(b)(1), unless the authorization is terminated  or revoked sooner.       Influenza A by PCR NEGATIVE NEGATIVE Final   Influenza B by PCR NEGATIVE NEGATIVE Final    Comment: (NOTE) The Xpert Xpress SARS-CoV-2/FLU/RSV plus assay is intended as an aid in the diagnosis of influenza from Nasopharyngeal swab specimens and should not be used as a sole basis for treatment. Nasal washings and aspirates are unacceptable for Xpert Xpress SARS-CoV-2/FLU/RSV testing.  Fact Sheet for Patients: EntrepreneurPulse.com.au  Fact Sheet for Healthcare Providers: IncredibleEmployment.be  This test is not yet approved or cleared by the [place] FDA and has been authorized for detection and/or diagnosis of SARS-CoV-2 by FDA under an Emergency Use Authorization (EUA). This EUA will remain in effect (meaning  this test can be used) for the duration of the COVID-19 declaration under Section 564(b)(1) of the Act, 21 U.S.C. section 360bbb-3(b)(1), unless the authorization is terminated or revoked.  Performed at [facility], [place] [address], [place], [place] [address]   Culture, blood (routine x 2)     Status: None   Collection Time: 09/04/21  2:00 PM   Specimen: BLOOD  Result Value Ref Range Status   Specimen Description   Final    BLOOD SITE NOT SPECIFIED Performed at [facility] [address] [place], [place] [address]    Special Requests   Final    BOTTLES DRAWN AEROBIC AND ANAEROBIC Blood Culture results may not be optimal due to an excessive volume of blood received in culture bottles Performed at [facility] [address], [place], [place] [address]    Culture   Final    NO GROWTH 5 DAYS Performed at [facility], [address] [place], [place] [address]    Report Status 09/09/2021 FINAL  Final  Urine Culture     Status: Abnormal   Collection Time: 09/04/21  5:03 PM   Specimen: Urine, Catheterized  Result Value Ref Range Status   Specimen Description   Final    URINE, CATHETERIZED Performed at [facility] [address], [place], [place] [address]    Special Requests   Final    NONE Performed at [facility], [place] [address] [place], [place] [address]    Culture (A)  Final    1,000 COLONIES/mL STAPHYLOCOCCUS EPIDERMIDIS CALL MICROBIOLOGY LAB IF SENSITIVITIES ARE REQUIRED. Performed at [facility], [place] [address], [place], [place] [address]    Report Status 09/06/2021 FINAL  Final         Radiology Studies: No results found.      Scheduled Meds:  Chlorhexidine Gluconate Cloth  6 each Topical Daily   ciprofloxacin-dexamethasone  3 drop Left EAR TID   feeding supplement  1 Container Oral TID BM   finasteride  5 mg Oral Daily   fluticasone  1  spray Each Nare BID   gabapentin  100 mg Oral BID   loratadine  10 mg Oral Daily   melatonin  3 mg Oral QHS   sertraline  100 mg Oral Daily   tamsulosin  0.8 mg Oral Daily   traZODone  50 mg Oral QHS   Continuous Infusions:          Aline August, MD [facility] 09/11/2021, 7:48 AM

## 2021-09-11 NOTE — Progress Notes (Signed)
Foley catheter removed at 1055 per MD order for voiding trial.

## 2021-09-11 NOTE — TOC Progression Note (Addendum)
Transition of Care [facility]) - Progression Note    Patient Details  Name: [name] MRN: [id number] Date of Birth: January 10, 1934  Transition [facility]) CM/SW Contact  Leeroy Cha, RN Phone Number: 09/11/2021, 8:16 AM  Clinical Narrative:    Patient was faxed out to [facility] on [id number]/  tcf-admission -no beds Tct=-vidant phone number in system is incorrectl. 0920-tct daughter-mobile phone. Message left on voice mail for her to return call. [phone] return call from daughter does not want father to go  a psych. Near by [employer]. Told her that she does not have a say in where he goes.  Due the fact that he will go where there is a bed available.  She is the POA and the sister is not. Explained  what ivc is and that we have to follow the legal system. Have alerted the staff and md for this information. [facility] [place] no [facility]i psych bed available. Gray Bernhardt with [facility].  Private chat send to have patient reviewed for admission. Expected Discharge Plan and Services                                                 Social Determinants of Health (SDOH) Interventions    Readmission Risk Interventions   No data to display

## 2021-09-12 DIAGNOSIS — T50902A Poisoning by unspecified drugs, medicaments and biological substances, intentional self-harm, initial encounter: Secondary | ICD-10-CM | POA: Diagnosis not present

## 2021-09-12 DIAGNOSIS — G9341 Metabolic encephalopathy: Secondary | ICD-10-CM | POA: Diagnosis not present

## 2021-09-12 DIAGNOSIS — R338 Other retention of urine: Secondary | ICD-10-CM | POA: Diagnosis not present

## 2021-09-12 DIAGNOSIS — G928 Other toxic encephalopathy: Secondary | ICD-10-CM | POA: Diagnosis not present

## 2021-09-12 LAB — GLUCOSE, CAPILLARY
Glucose-Capillary: 100 mg/dL — ABNORMAL HIGH (ref 70–99)
Glucose-Capillary: 108 mg/dL — ABNORMAL HIGH (ref 70–99)
Glucose-Capillary: 111 mg/dL — ABNORMAL HIGH (ref 70–99)
Glucose-Capillary: 124 mg/dL — ABNORMAL HIGH (ref 70–99)
Glucose-Capillary: 145 mg/dL — ABNORMAL HIGH (ref 70–99)
Glucose-Capillary: 97 mg/dL (ref 70–99)
Glucose-Capillary: 99 mg/dL (ref 70–99)

## 2021-09-12 NOTE — Progress Notes (Signed)
PROGRESS NOTE    [name]  ZOX:[id number] DOB: May 04, 1933 DOA: 09/04/2021 PCP: Pleas Koch, NP   Brief Narrative:  88 y.o. male with past medical history significant for HTN, HLD, CAD, choledocholithiasis, laparoscopic cholecystectomy, BPH, overactive bladder, OSA not on CPAP, cognitive impairment, anxiety/depression, Leukemia presented on 09/04/2021 with increased lethargy and confusion, hallucinations with possible suicidal ideation.  Patient's daughter found empty Dilaudid container at home, which was apparently his wife's previous medication (wife passed away 2 years ago from pancreatic cancer).  There was a concern for intentional overdose.  On presentation, CT of the head without contrast was without acute intracranial abnormity.  Chest x-ray showed no acute cardiopulmonary disease process.  Patient was IVC'd.  Psychiatry was consulted.  Psychiatry recommended transfer to [facility] facility.  Currently medically stable for discharge.  Assessment & Plan:   Acute metabolic/toxic encephalopathy secondary to possible intentional opiate overdose Suicide attempt, continued ideations Anxiety/depression -Psychiatry following.  Currently IVCD'd.  Psychiatry recommending transfer to [facility] facility.  Social worker following. -Continue suicide precautions, one-to-one sitter. -Currently on trazodone, sertraline, gabapentin as per psychiatry -Continue as needed Ativan for agitation -Currently medically stable for discharge  Aspiration pneumonia -Status post 5 days of antibiotics, antibiotics discontinued on 09/09/2021 -Currently on room air.  Diet as per SLP recommendations.  Leukocytosis -Blood cultures negative so far.  Urine culture grew Staph epidermidis: Likely contaminant -No labs recently.  Acute urinary retention Hematuria BPH -Hematuria likely secondary to traumatic Foley placement in the ED.  Attempted to discontinue Foley catheter with continued urinary retention  and hematuria.  Urology consulted and patient underwent coud catheter placement by Dr. Alyson Ingles on 09/05/2021. -Continue Flomax and finasteride. -On 09/06/2021: Urology had recommended that Foley should remain in place for 5 to 7 days prior to attempting a voiding trial.  After communicating with Dr. Alyson Ingles via secure chat on 09/11/2021, Foley catheter was removed on 09/11/2021 and patient is voiding well subsequently.  Outpatient follow-up with urology.  Thrombocytopenia -Questionable cause.  No recent labs.  Left otitis externa -Continue Ciprodex eardrops  CAD/hypertension/hyperlipidemia -Currently not on any medication at this time.  Outpatient follow-up with PCP   DVT prophylaxis: SCDs Code Status: DNR Family Communication: Daughter/Karen at bedside on 09/12/2021 Disposition Plan: Status is: Inpatient Remains inpatient appropriate because: Of need for [facility] psych placement.  Currently medically stable for discharge    Consultants: Psychiatry/urology  Procedures: None  Antimicrobials:  Anti-infectives (From admission, onward)    Start     Dose/Rate Route Frequency Ordered Stop   09/05/21 1115  cefTRIAXone (ROCEPHIN) 2 g in sodium chloride 0.9 % 100 mL IVPB        2 g 200 mL/hr over 30 Minutes Intravenous Every 24 hours 09/05/21 1019 09/09/21 1203   09/05/21 1115  metroNIDAZOLE (FLAGYL) IVPB 500 mg  Status:  Discontinued        500 mg 100 mL/hr over 60 Minutes Intravenous Every 12 hours 09/05/21 1019 09/09/21 1418        Subjective: Patient seen and examined at bedside.  Hard of hearing.  No agitation, seizures, vomiting reported.   Objective: Vitals:   09/11/21 0431 09/11/21 1230 09/11/21 2130 09/12/21 0415  BP: 125/61 (!) 124/57 (!) 132/58 133/70  Pulse: 60 96 91 92  Resp: '18 20 18 18  '$ Temp: 98.9 F (37.2 C) 98.3 F (36.8 C) 98.2 F (36.8 C) 98.4 F (36.9 C)  TempSrc: Oral Oral Oral Oral  SpO2: 95% 95% 96% 96%  Weight:  Height:        Intake/Output  Summary (Last 24 hours) at 09/12/2021 0813 Last data filed at 09/12/2021 0027 Gross per 24 hour  Intake 720 ml  Output 875 ml  Net -155 ml    Filed Weights   09/07/21 1151 09/10/21 1042  Weight: 59.2 kg 59.2 kg    Examination:  General: No acute distress currently.  Still on room air.  Hard of hearing.  Slow to respond.  Poor historian.  Flat affect. respiratory: Bilateral decreased breath sounds at bases with some scattered crackles  CVS: S1 and S2 heard; rate controlled currently abdominal: Soft, nontender, has mild distention; no organomegaly; bowel sounds are normally extremities: No cyanosis; mild lower extremity edema present.   Genitourinary: Foley catheter has been removed   Data Reviewed: I have personally reviewed following labs and imaging studies  CBC: Recent Labs  Lab 09/05/21 0901 09/06/21 0442 09/07/21 0446 09/08/21 0452 09/09/21 0529  WBC 51.5* 23.7* 13.8* 14.9* 15.5*  NEUTROABS  --  17.9* 9.5* 10.2*  --   HGB 12.8* 10.5* 10.5* 11.6* 12.0*  HCT 41.7 33.1* 33.7* 36.9* 39.2  MCV 88.7 87.1 86.9 85.8 87.7  PLT 55* 41* 36* 44* 54*    Basic Metabolic Panel: Recent Labs  Lab 09/06/21 0442 09/07/21 0446 09/08/21 0452 09/09/21 0529  NA 140 143 140 143  K 3.7 3.5 3.4* 3.5  CL 114* 120* 115* 116*  CO2 23 21* 20* 23  GLUCOSE 129* 101* 95 105*  BUN '18 11 9 8  '$ CREATININE 0.83 0.73 0.71 0.69  CALCIUM 7.6* 7.6* 8.1* 8.2*  MG  --   --   --  1.9    GFR: Estimated Creatinine Clearance: 47.7 mL/min (by C-G formula based on SCr of 0.69 mg/dL). Liver Function Tests: No results for input(s): "AST", "ALT", "ALKPHOS", "BILITOT", "PROT", "ALBUMIN" in the last 168 hours.  No results for input(s): "LIPASE", "AMYLASE" in the last 168 hours. No results for input(s): "AMMONIA" in the last 168 hours. Coagulation Profile: No results for input(s): "INR", "PROTIME" in the last 168 hours. Cardiac Enzymes: No results for input(s): "CKTOTAL", "CKMB", "CKMBINDEX",  "TROPONINI" in the last 168 hours. BNP (last 3 results) No results for input(s): "PROBNP" in the last 8760 hours. HbA1C: No results for input(s): "HGBA1C" in the last 72 hours. CBG: Recent Labs  Lab 09/11/21 1619 09/11/21 2002 09/12/21 0004 09/12/21 0345 09/12/21 0748  GLUCAP 117* 116* 100* 97 99    Lipid Profile: No results for input(s): "CHOL", "HDL", "LDLCALC", "TRIG", "CHOLHDL", "LDLDIRECT" in the last 72 hours. Thyroid Function Tests: No results for input(s): "TSH", "T4TOTAL", "FREET4", "T3FREE", "THYROIDAB" in the last 72 hours. Anemia Panel: No results for input(s): "VITAMINB12", "FOLATE", "FERRITIN", "TIBC", "IRON", "RETICCTPCT" in the last 72 hours. Sepsis Labs: No results for input(s): "PROCALCITON", "LATICACIDVEN" in the last 168 hours.   Recent Results (from the past 240 hour(s))  Culture, blood (routine x 2)     Status: None   Collection Time: 09/04/21 12:41 PM   Specimen: BLOOD  Result Value Ref Range Status   Specimen Description   Final    BLOOD LEFT ANTECUBITAL Performed at [facility] [address] [place], [place] [address]    Special Requests   Final    BOTTLES DRAWN AEROBIC AND ANAEROBIC Blood Culture adequate volume Performed at [facility] [address], [place], [place] [address]    Culture   Final    NO GROWTH 5 DAYS Performed at  [facility], [place] [address] [place], [place] [address]    Report Status 09/09/2021 FINAL  Final  Resp Panel by RT-PCR (Flu A&B, Covid)     Status: None   Collection Time: 09/04/21  1:46 PM   Specimen: Nasal Swab  Result Value Ref Range Status   SARS Coronavirus 2 by RT PCR NEGATIVE NEGATIVE Final    Comment: (NOTE) SARS-CoV-2 target nucleic acids are NOT DETECTED.  The SARS-CoV-2 RNA is generally detectable in upper respiratory specimens during the acute phase of infection. The lowest concentration of SARS-CoV-2 viral copies this assay can detect is 138  copies/mL. A negative result does not preclude SARS-Cov-2 infection and should not be used as the sole basis for treatment or other patient management decisions. A negative result may occur with  improper specimen collection/handling, submission of specimen other than nasopharyngeal swab, presence of viral mutation(s) within the areas targeted by this assay, and inadequate number of viral copies(<138 copies/mL). A negative result must be combined with clinical observations, patient history, and epidemiological information. The expected result is Negative.  Fact Sheet for Patients:  EntrepreneurPulse.com.au  Fact Sheet for Healthcare Providers:  IncredibleEmployment.be  This test is no t yet approved or cleared by the [place] FDA and  has been authorized for detection and/or diagnosis of SARS-CoV-2 by FDA under an Emergency Use Authorization (EUA). This EUA will remain  in effect (meaning this test can be used) for the duration of the COVID-19 declaration under Section 564(b)(1) of the Act, 21 U.S.C.section 360bbb-3(b)(1), unless the authorization is terminated  or revoked sooner.       Influenza A by PCR NEGATIVE NEGATIVE Final   Influenza B by PCR NEGATIVE NEGATIVE Final    Comment: (NOTE) The Xpert Xpress SARS-CoV-2/FLU/RSV plus assay is intended as an aid in the diagnosis of influenza from Nasopharyngeal swab specimens and should not be used as a sole basis for treatment. Nasal washings and aspirates are unacceptable for Xpert Xpress SARS-CoV-2/FLU/RSV testing.  Fact Sheet for Patients: EntrepreneurPulse.com.au  Fact Sheet for Healthcare Providers: IncredibleEmployment.be  This test is not yet approved or cleared by the [place] FDA and has been authorized for detection and/or diagnosis of SARS-CoV-2 by FDA under an Emergency Use Authorization (EUA). This EUA will remain in effect (meaning  this test can be used) for the duration of the COVID-19 declaration under Section 564(b)(1) of the Act, 21 U.S.C. section 360bbb-3(b)(1), unless the authorization is terminated or revoked.  Performed at [facility], [place] [address], [place], [place] [address]   Culture, blood (routine x 2)     Status: None   Collection Time: 09/04/21  2:00 PM   Specimen: BLOOD  Result Value Ref Range Status   Specimen Description   Final    BLOOD SITE NOT SPECIFIED Performed at [facility] [address], [place], [place] [address]    Special Requests   Final    BOTTLES DRAWN AEROBIC AND ANAEROBIC Blood Culture results may not be optimal due to an excessive volume of blood received in culture bottles Performed at [facility] [address] [place], [place] [address]    Culture   Final    NO GROWTH 5 DAYS Performed at [facility], [place] [address], [place],  [address]    Report Status 09/09/2021 FINAL  Final  Urine Culture     Status: Abnormal   Collection Time: 09/04/21  5:03 PM   Specimen: Urine,  Catheterized  Result Value Ref Range Status   Specimen Description   Final    URINE, CATHETERIZED Performed at [facility] [address], [place], [place] [address]    Special Requests   Final    NONE Performed at [facility], [place] [address] [place], [place] [address]    Culture (A)  Final    1,000 COLONIES/mL STAPHYLOCOCCUS EPIDERMIDIS CALL MICROBIOLOGY LAB IF SENSITIVITIES ARE REQUIRED. Performed at [facility], [place] [address] [place], [place] [address]    Report Status 09/06/2021 FINAL  Final         Radiology Studies: No results found.      Scheduled Meds:  Chlorhexidine Gluconate Cloth  6 each Topical Daily   ciprofloxacin-dexamethasone  3 drop Left EAR TID   feeding supplement  1 Container Oral TID BM   finasteride  5 mg Oral Daily   fluticasone  1  spray Each Nare BID   gabapentin  100 mg Oral BID   loratadine  10 mg Oral Daily   melatonin  3 mg Oral QHS   sertraline  100 mg Oral Daily   tamsulosin  0.8 mg Oral Daily   traZODone  50 mg Oral QHS   Continuous Infusions:          Aline August, MD [facility] 09/12/2021, 8:13 AM

## 2021-09-12 NOTE — Progress Notes (Signed)
Patient ID: [name], male   DOB: 03-23-1933, 88 y.o.   MRN: [id number]   [name] is voiding well since the foley was removed yesterday.  He as 300-452m volumes.  He has had no further hematuria.

## 2021-09-12 NOTE — Evaluation (Signed)
Clinical/Bedside Swallow Evaluation Patient Details  Name: [name] MRN: [id number] Date of Birth: 07/24/1933  Today's Date: 09/12/2021 Time: SLP Start Time (ACUTE ONLY): 1455 SLP Stop Time (ACUTE ONLY): 1515 SLP Time Calculation (min) (ACUTE ONLY): 20 min  Past Medical History:  Past Medical History:  Diagnosis Date   Arthralgia of multiple joints    limited mobility w/  independant adl's   BPH with obstruction/lower urinary tract symptoms    Chronic idiopathic monocytosis    followed by dr Waymon Budge--  per lov note 06/ 2017 persistant unexplained  with normal bone barrow bx   Coronary atherosclerosis of unspecified type of vessel, native or graft    cardiologist-  dr Stanford Breed -- per lov note 11-19-2014 ,  cardiac cath in 2000-- pLAD 60-70%,  small intermediate branch 70-80%,  mRCA 20%,  normal LV   Degenerative arthritis of spine    cervical and lumar   Diverticulosis of colon (without mention of hemorrhage)    Dysuria    chronic   Elevated PSA    Feeling of incomplete bladder emptying    Hiatal hernia    moderate per ct 11/ 2017   History of adenomatous polyp of colon    2008   History of atrial fibrillation    remote hx episode   History of COVID-19 03/05/2020   History of pancreatitis    07-01-2013  and 10-10-2015   History of prostatitis    2014   Hx of colonic polyps    Hyperlipidemia    Hypertension    Mouth sore    roof of mouth sore    Nephrolithiasis    Nocturia more than twice per night    severe  w/ leakage   OSA (obstructive sleep apnea)    INTOLERANT CPAP   Osteoarthritis    Pernicious anemia    B12  Def.   Peyronie disease    Renal insufficiency    Sepsis ([place]) 03/22/2017   Thrombocytopenia, unspecified ([place]) hemotology/oncologist-  dr Waymon Budge   per dr Velta Addison note 06/ 2016  secondary to vitro clumping   Vitamin B deficiency    Past Surgical History:  Past Surgical History:  Procedure Laterality Date   BONE MARROW  BIOPSY  2011   normal   CARDIAC CATHETERIZATION  2000   per dr Kathyrn Drown note --  dLAD 60-70%,  small intermediate branch 70-80%,  mRCA 20%,  normal LV   CARDIOVASCULAR STRESS TEST  01-29-2013   dr Stanford Breed   normal nuclear study w/ no ischemia,  normal LV function and wall motion , ef 82%   CATARACT EXTRACTION W/ INTRAOCULAR LENS  IMPLANT, BILATERAL  08/2015   CHOLECYSTECTOMY  11/24/2010   Procedure: LAPAROSCOPIC CHOLECYSTECTOMY WITH INTRAOPERATIVE CHOLANGIOGRAM;  Surgeon: Earnstine Regal, MD;  Location: [facility];  Service: General;  Laterality: N/A;  c-arm   CYSTOSCOPY W/ URETERAL STENT PLACEMENT Left 12/17/2015   Procedure: CYSTOSCOPY WITH RETROGRADE PYELOGRAM/URETERAL STENT PLACEMENT;  Surgeon: Ardis Hughs, MD;  Location: [facility];  Service: Urology;  Laterality: Left;   CYSTOSCOPY WITH RETROGRADE PYELOGRAM, URETEROSCOPY AND STENT PLACEMENT Left 12/22/2015   Procedure: CYSTOSCOPY WITH LEFT RETROGRADE  URETEROSCOPY AND STENT PLACEMENT;  Surgeon: Carolan Clines, MD;  Location: [facility];  Service: Urology;  Laterality: Left;   ERCP N/A 07/04/2013   Procedure: ENDOSCOPIC RETROGRADE CHOLANGIOPANCREATOGRAPHY (ERCP);  Surgeon: Gatha Mayer, MD;  Location: [facility];  Service: Endoscopy;  Laterality: N/A;  MAC if available   ERCP N/A  10/12/2015   Procedure: ENDOSCOPIC RETROGRADE CHOLANGIOPANCREATOGRAPHY (ERCP);  Surgeon: [name], MD;  Location: [facility];  Service: Endoscopy;  Laterality: N/A;   HOLMIUM LASER APPLICATION Left 50/02/7739   Procedure: HOLMIUM LASER APPLICATION;  Surgeon: Carolan Clines, MD;  Location[facility];  Service: Urology;  Laterality: Left;   INGUINAL HERNIA REPAIR Right 01/25/2002   KNEE ARTHROSCOPY  x5   SATURATION BIOPSY OF PROSTATE  05-29-2007  and 01-26-2008   SHOULDER ARTHROSCOPY WITH OPEN ROTATOR CUFF REPAIR AND DISTAL CLAVICLE ACROMINECTOMY Right 11/19/2004   TOTAL HIP ARTHROPLASTY Right 05/21/2009   TOTAL KNEE  ARTHROPLASTY Bilateral left 05-02-2003/  right  03-23-2010   TRANSTHORACIC ECHOCARDIOGRAM  12/23/2004   ef 60%, mild MV calcification without stenosis/  mild TR,  PASP 67mHg   UVULOPALATOPHARYNGOPLASTY  1993    w/  T & A   HPI:  Patient is an 88y.o. male with PMH: HTN, HLD, CAD, OSA not on CPAP, cognitive impairment, anxiety/depression, h/o Patient has h/o uvulectomy and tonsilectomy, [facility] after patient was found by his daughter with increased confusion and lethargy. Patient with recent hospitalizations for similar events with previous reports of hallucinations as well. Daughters also reported that patient had increased SI and his daughter found an empty container of Dilaudid at home which was his wife's medication (she passed away two years ago)   and there is concern of an intentional overdose. In ED, patient was afebrile, satting 92% on RA. CT head negative for acute intracranial process, CXR not showing acute cardiopulmonary disease process. Patient was admitted for work-up of acute metabolic encephalopathy. Patient was coughing a lot with medications in morning of 09/11/21 and as patient and daughter report h/o swallowing difficulties, SLP was ordered to evaluate swallow function.    Assessment / Plan / Recommendation  Clinical Impression  Although patient did not exhibit any overt s/s of dysphagia as per this bedside/clinical swallow evaluation, based on patient's and daughter's report as well as RN's, he is exhibiting inconsitent s/s of dysphagia. RN reported that previous morning he had a lot of coughing when taking medications but today he has done well. Patient's daughter reports that his swallowing seems to have declined somewhat in past couple months. Based on patient's h/o dysphagia that has never been objectively evaluated, SLP is recommending to proceed with an MBS which can be completed on Monday. SLP Visit Diagnosis: Dysphagia, unspecified (R13.10)     Aspiration Risk  Mild aspiration risk    Diet Recommendation Regular;Thin liquid   Liquid Administration via: Cup;Spoon Medication Administration: Whole meds with liquid Supervision: Patient able to self feed Compensations: Slow rate;Small sips/bites Postural Changes: Seated upright at 90 degrees;Remain upright for at least 30 minutes after po intake    Other  Recommendations Oral Care Recommendations: Oral care BID    Recommendations for follow up therapy are one component of a multi-disciplinary discharge planning process, led by the attending physician.  Recommendations may be updated based on patient status, additional functional criteria and insurance authorization.  Follow up Recommendations Other (comment) (TBD pending MBS results)      Assistance Recommended at Discharge Frequent or constant Supervision/Assistance  Functional Status Assessment Patient has had a recent decline in their functional status and demonstrates the ability to make significant improvements in function in a reasonable and predictable amount of time.  Frequency and Duration min 1 x/week  1 week       Prognosis Prognosis for Safe  Diet Advancement: Good      Swallow Study   General Date of Onset: 09/12/21 HPI: Patient is an 88 y.o. male with PMH: HTN, HLD, CAD, OSA not on CPAP, cognitive impairment, anxiety/depression, h/o Patient has h/o uvulectomy and tonsilectomy, who [facility] via EMS after patient was found by his daughter with increased confusion and lethargy. Patient with recent hospitalizations for similar events with previous reports of hallucinations as well. Daughters also reported that patient had increased SI and his daughter found an empty container of Dilaudid at home which was his wife's medication (she passed away two years ago)   and there is concern of an intentional overdose. In ED, patient was afebrile, satting 92% on RA. CT head negative for acute intracranial  process, CXR not showing acute cardiopulmonary disease process. Patient was admitted for work-up of acute metabolic encephalopathy. Patient was coughing a lot with medications in morning of 09/11/21 and as patient and daughter report h/o swallowing difficulties, SLP was ordered to evaluate swallow function. Type of Study: Bedside Swallow Evaluation Previous Swallow Assessment: none found Diet Prior to this Study: Regular;Thin liquids Temperature Spikes Noted: No Respiratory Status: Room air History of Recent Intubation: No Behavior/Cognition: Alert;Cooperative;Pleasant mood Oral Cavity Assessment: Within Functional Limits Oral Care Completed by SLP: No Oral Cavity - Dentition: Edentulous Vision: Functional for self-feeding Self-Feeding Abilities: Able to feed self Patient Positioning: Upright in bed Baseline Vocal Quality: Normal Volitional Cough: Strong    Oral/Motor/Sensory Function Overall Oral Motor/Sensory Function: Within functional limits   Ice Chips     Thin Liquid Thin Liquid: Within functional limits Presentation: Straw;Self Fed    Nectar Thick     Honey Thick     Puree Puree: Not tested Presentation: Self Fed   Solid     Solid: Not tested      Sonia Baller, MA, CCC-SLP Speech Therapy

## 2021-09-12 NOTE — Progress Notes (Signed)
Subjective: Foley is out and he is voiding well without hematuria.   Objective: Vital signs in last 24 hours: Temp:  [98.2 F (36.8 C)-98.4 F (36.9 C)] 98.4 F (36.9 C) (08/26 0415) Pulse Rate:  [91-96] 92 (08/26 0415) Resp:  [18-20] 18 (08/26 0415) BP: (124-133)/(57-70) 133/70 (08/26 0415) SpO2:  [95 %-96 %] 96 % (08/26 0415)  Intake/Output from previous day: 08/25 0701 - 08/26 0700 In: 1080 [P.O.:1080] Out: 875 [Urine:875] Intake/Output this shift: No intake/output data recorded.  Physical Exam:  Gen: WD, WN in NAD.  Lab Results: No results for input(s): "HGB", "HCT" in the last 72 hours.  BMET No results for input(s): "NA", "K", "CL", "CO2", "GLUCOSE", "BUN", "CREATININE", "CALCIUM" in the last 72 hours.  No results for input(s): "LABPT", "INR" in the last 72 hours. No results for input(s): "LABURIN" in the last 72 hours. Results for orders placed or performed during the hospital encounter of 09/04/21  Culture, blood (routine x 2)     Status: None   Collection Time: 09/04/21 12:41 PM   Specimen: BLOOD  Result Value Ref Range Status   Specimen Description   Final    BLOOD LEFT ANTECUBITAL Performed at [facility] [address], [place], [place] [address]    Special Requests   Final    BOTTLES DRAWN AEROBIC AND ANAEROBIC Blood Culture adequate volume Performed at [facility] [address] [place], [place] [address]    Culture   Final    NO GROWTH 5 DAYS Performed at [facility], [place] [address], [place], [place] [address]    Report Status 09/09/2021 FINAL  Final  Resp Panel by RT-PCR (Flu A&B, Covid)     Status: None   Collection Time: 09/04/21  1:46 PM   Specimen: Nasal Swab  Result Value Ref Range Status   SARS Coronavirus 2 by RT PCR NEGATIVE NEGATIVE Final    Comment: (NOTE) SARS-CoV-2 target nucleic acids are NOT DETECTED.  The SARS-CoV-2 RNA is generally detectable in upper respiratory specimens  during the acute phase of infection. The lowest concentration of SARS-CoV-2 viral copies this assay can detect is 138 copies/mL. A negative result does not preclude SARS-Cov-2 infection and should not be used as the sole basis for treatment or other patient management decisions. A negative result may occur with  improper specimen collection/handling, submission of specimen other than nasopharyngeal swab, presence of viral mutation(s) within the areas targeted by this assay, and inadequate number of viral copies(<138 copies/mL). A negative result must be combined with clinical observations, patient history, and epidemiological information. The expected result is Negative.  Fact Sheet for Patients:  EntrepreneurPulse.com.au  Fact Sheet for Healthcare Providers:  IncredibleEmployment.be  This test is no t yet approved or cleared by the [place] FDA and  has been authorized for detection and/or diagnosis of SARS-CoV-2 by FDA under an Emergency Use Authorization (EUA). This EUA will remain  in effect (meaning this test can be used) for the duration of the COVID-19 declaration under Section 564(b)(1) of the Act, 21 U.S.C.section 360bbb-3(b)(1), unless the authorization is terminated  or revoked sooner.       Influenza A by PCR NEGATIVE NEGATIVE Final   Influenza B by PCR NEGATIVE NEGATIVE Final    Comment: (NOTE) The Xpert Xpress SARS-CoV-2/FLU/RSV plus assay is intended as an aid in the diagnosis of influenza from Nasopharyngeal swab specimens and should not be used as a sole basis for treatment. Nasal washings and aspirates  are unacceptable for Xpert Xpress SARS-CoV-2/FLU/RSV testing.  Fact Sheet for Patients: EntrepreneurPulse.com.au  Fact Sheet for Healthcare Providers: IncredibleEmployment.be  This test is not yet approved or cleared by the [place] FDA and has been authorized for detection  and/or diagnosis of SARS-CoV-2 by FDA under an Emergency Use Authorization (EUA). This EUA will remain in effect (meaning this test can be used) for the duration of the COVID-19 declaration under Section 564(b)(1) of the Act, 21 U.S.C. section 360bbb-3(b)(1), unless the authorization is terminated or revoked.  Performed at [facility], [place] [address], [place], [place] [address]   Culture, blood (routine x 2)     Status: None   Collection Time: 09/04/21  2:00 PM   Specimen: BLOOD  Result Value Ref Range Status   Specimen Description   Final    BLOOD SITE NOT SPECIFIED Performed at [facility] [address] [place], [place] [address]    Special Requests   Final    BOTTLES DRAWN AEROBIC AND ANAEROBIC Blood Culture results may not be optimal due to an excessive volume of blood received in culture bottles Performed at [facility] [address], [place], [place] [address]    Culture   Final    NO GROWTH 5 DAYS Performed at [facility], [place] [address], [place], [place] [address]    Report Status 09/09/2021 FINAL  Final  Urine Culture     Status: Abnormal   Collection Time: 09/04/21  5:03 PM   Specimen: Urine, Catheterized  Result Value Ref Range Status   Specimen Description   Final    URINE, CATHETERIZED Performed at [facility] [address], [place], [place] [address]    Special Requests   Final    NONE Performed at [facility], [place] [address], [place], [place] [address]    Culture (A)  Final    1,000 COLONIES/mL STAPHYLOCOCCUS EPIDERMIDIS CALL MICROBIOLOGY LAB IF SENSITIVITIES ARE REQUIRED. Performed at [facility], [place] [address] [place],  [address]    Report Status 09/06/2021 FINAL  Final    Studies/Results: No results found.  Assessment/Plan: 87yo with urinary retention and gross hematuria  Foley removed yesterday and he is voiding  300-428m at a time.     LOS: 7 days   Brandon Seal8/26/2023, 9:30 AM  Patient ID: [name] male   DOB: 9February 10, 1935 88y.o.   MRN: [id number]

## 2021-09-13 DIAGNOSIS — G9341 Metabolic encephalopathy: Secondary | ICD-10-CM | POA: Diagnosis not present

## 2021-09-13 DIAGNOSIS — G928 Other toxic encephalopathy: Secondary | ICD-10-CM | POA: Diagnosis not present

## 2021-09-13 DIAGNOSIS — T50902A Poisoning by unspecified drugs, medicaments and biological substances, intentional self-harm, initial encounter: Secondary | ICD-10-CM | POA: Diagnosis not present

## 2021-09-13 DIAGNOSIS — R338 Other retention of urine: Secondary | ICD-10-CM | POA: Diagnosis not present

## 2021-09-13 LAB — GLUCOSE, CAPILLARY
Glucose-Capillary: 88 mg/dL (ref 70–99)
Glucose-Capillary: 97 mg/dL (ref 70–99)
Glucose-Capillary: 127 mg/dL — ABNORMAL HIGH (ref 70–99)
Glucose-Capillary: 165 mg/dL — ABNORMAL HIGH (ref 70–99)
Glucose-Capillary: 136 mg/dL — ABNORMAL HIGH (ref 70–99)

## 2021-09-13 NOTE — Progress Notes (Signed)
Per Sheran Fava, NP, patient meets criteria for inpatient treatment. There are no available beds at [facility] today. CSW faxed referrals to the following facilities for review:  [facility]  Pending - No Request Sent N/A [address] [place] [address] [phone] --  [facility] No Request Sent N/A [address] [place] [address] [phone] --  [facility]  Pending - No Request Sent N/A [address] [place] [address] [phone] --  [facility]  Pending - No Request Sent N/A [address] [place], [place] [address] [phone] --  [facility]  Pending - No Request Sent N/A [address] [place] [address] [phone] --  [facility]  Pending - No Request Sent N/A [address] [place] [address] [phone] --  [facility]  Pending - No Request Sent N/A [place] [place]  [phone] --  [facility]  Pending - No Request Sent N/A [address], [place] [address] [phone] --  [facility]  Pending - No Request Sent N/A [address] [place] [address] [phone] --   TTS will continue to seek bed placement.  Glennie [place], MSW, [facility] [place] Phone: [phone] Disposition/TOC

## 2021-09-13 NOTE — TOC Progression Note (Signed)
Transition of Care [facility]) - Progression Note    Patient Details  Name: [name] MRN: [id number] Date of Birth: 1933-02-03  Transition of Care [facility]) CM/SW Contact  Ross Ludwig, [place] Phone Number: 09/13/2021, 12:00 PM  Clinical Narrative:     Patient still does not have any bed offers for inpatient geripsych.  TOC continuing to look for beds.        Expected Discharge Plan and Services                                                 Social Determinants of Health (SDOH) Interventions    Readmission Risk Interventions     No data to display

## 2021-09-13 NOTE — Progress Notes (Signed)
PROGRESS NOTE    [name]  TKW:[id number] DOB: 15-Sep-1933 DOA: 09/04/2021 PCP: Pleas Koch, NP   Brief Narrative:  88 y.o. male with past medical history significant for HTN, HLD, CAD, choledocholithiasis, laparoscopic cholecystectomy, BPH, overactive bladder, OSA not on CPAP, cognitive impairment, anxiety/depression, Leukemia presented on 09/04/2021 with increased lethargy and confusion, hallucinations with possible suicidal ideation.  Patient's daughter found empty Dilaudid container at home, which was apparently his wife's previous medication (wife passed away 2 years ago from pancreatic cancer).  There was a concern for intentional overdose.  On presentation, CT of the head without contrast was without acute intracranial abnormity.  Chest x-ray showed no acute cardiopulmonary disease process.  Patient was IVC'd.  Psychiatry was consulted.  Psychiatry recommended transfer to [place] facility.  Currently medically stable for discharge.  Assessment & Plan:   Acute metabolic/toxic encephalopathy secondary to possible intentional opiate overdose Suicide attempt, continued ideations Anxiety/depression -Psychiatry following.  Currently IVCD'd.  Psychiatry recommending transfer to [facility] facility.  Social worker following. -Continue suicide precautions, one-to-one sitter. -Currently on trazodone, sertraline, gabapentin as per psychiatry -Continue as needed Ativan for agitation -Currently medically stable for discharge  Aspiration pneumonia -Status post 5 days of antibiotics, antibiotics discontinued on 09/09/2021 -Currently on room air.  Diet as per SLP recommendations.  Leukocytosis -Blood cultures negative so far.  Urine culture grew Staph epidermidis: Likely contaminant -No labs recently.  Acute urinary retention Hematuria BPH -Hematuria likely secondary to traumatic Foley placement in the ED.  Attempted to discontinue Foley catheter with continued urinary retention  and hematuria.  Urology consulted and patient underwent coud catheter placement by Dr. Alyson Ingles on 09/05/2021. -Continue Flomax and finasteride. -On 09/06/2021: Urology had recommended that Foley should remain in place for 5 to 7 days prior to attempting a voiding trial.  After communicating with Dr. Alyson Ingles via secure chat on 09/11/2021, Foley catheter was removed on 09/11/2021 and patient is voiding well subsequently.  Outpatient follow-up with urology.  Thrombocytopenia -Questionable cause.  No recent labs.  Left otitis externa -Continue Ciprodex eardrops  CAD/hypertension/hyperlipidemia -Currently not on any medication at this time.  Outpatient follow-up with PCP   DVT prophylaxis: SCDs Code Status: DNR Family Communication: Daughter/Karen at bedside on 09/12/2021 Disposition Plan: Status is: Inpatient Remains inpatient appropriate because: Of need for [facility] psych placement.  Currently medically stable for discharge    Consultants: Psychiatry/urology  Procedures: None  Antimicrobials:  Anti-infectives (From admission, onward)    Start     Dose/Rate Route Frequency Ordered Stop   09/05/21 1115  cefTRIAXone (ROCEPHIN) 2 g in sodium chloride 0.9 % 100 mL IVPB        2 g 200 mL/hr over 30 Minutes Intravenous Every 24 hours 09/05/21 1019 09/09/21 1203   09/05/21 1115  metroNIDAZOLE (FLAGYL) IVPB 500 mg  Status:  Discontinued        500 mg 100 mL/hr over 60 Minutes Intravenous Every 12 hours 09/05/21 1019 09/09/21 1418        Subjective: Patient seen and examined at bedside.  Hard of hearing.  No fevers, vomiting, agitation reported.   Objective: Vitals:   09/12/21 0415 09/12/21 1156 09/12/21 1823 09/13/21 0326  BP: 133/70 110/62 (!) 124/56 121/63  Pulse: 92 84 82 84  Resp: '18 18 18 18  '$ Temp: 98.4 F (36.9 C) 97.9 F (36.6 C) 97.9 F (36.6 C) 98.1 F (36.7 C)  TempSrc: Oral Oral Oral Oral  SpO2: 96% 98% 99% 98%  Weight:  Height:        Intake/Output Summary  (Last 24 hours) at 09/13/2021 0812 Last data filed at 09/12/2021 1730 Gross per 24 hour  Intake 960 ml  Output --  Net 960 ml    Filed Weights   09/07/21 1151 09/10/21 1042  Weight: 59.2 kg 59.2 kg    Examination:  General: On room air currently.  No distress. Hard of hearing.  Wakes up slightly, still slow to respond.  Poor historian.  Flat affect. respiratory: Decreased breath sounds at bases bilaterally, no wheezing  CVS: Currently rate controlled; S1 and S2 are heard abdominal: Soft, nontender, distended mildly; no organomegaly; normal bowel sounds heard extremities: Trace lower extremity edema present; no clubbing   Data Reviewed: I have personally reviewed following labs and imaging studies  CBC: Recent Labs  Lab 09/07/21 0446 09/08/21 0452 09/09/21 0529  WBC 13.8* 14.9* 15.5*  NEUTROABS 9.5* 10.2*  --   HGB 10.5* 11.6* 12.0*  HCT 33.7* 36.9* 39.2  MCV 86.9 85.8 87.7  PLT 36* 44* 54*    Basic Metabolic Panel: Recent Labs  Lab 09/07/21 0446 09/08/21 0452 09/09/21 0529  NA 143 140 143  K 3.5 3.4* 3.5  CL 120* 115* 116*  CO2 21* 20* 23  GLUCOSE 101* 95 105*  BUN '11 9 8  '$ CREATININE 0.73 0.71 0.69  CALCIUM 7.6* 8.1* 8.2*  MG  --   --  1.9    GFR: Estimated Creatinine Clearance: 47.7 mL/min (by C-G formula based on SCr of 0.69 mg/dL). Liver Function Tests: No results for input(s): "AST", "ALT", "ALKPHOS", "BILITOT", "PROT", "ALBUMIN" in the last 168 hours.  No results for input(s): "LIPASE", "AMYLASE" in the last 168 hours. No results for input(s): "AMMONIA" in the last 168 hours. Coagulation Profile: No results for input(s): "INR", "PROTIME" in the last 168 hours. Cardiac Enzymes: No results for input(s): "CKTOTAL", "CKMB", "CKMBINDEX", "TROPONINI" in the last 168 hours. BNP (last 3 results) No results for input(s): "PROBNP" in the last 8760 hours. HbA1C: No results for input(s): "HGBA1C" in the last 72 hours. CBG: Recent Labs  Lab 09/12/21 1651  09/12/21 1931 09/12/21 2341 09/13/21 0323 09/13/21 0734  GLUCAP 111* 124* 145* 88 97    Lipid Profile: No results for input(s): "CHOL", "HDL", "LDLCALC", "TRIG", "CHOLHDL", "LDLDIRECT" in the last 72 hours. Thyroid Function Tests: No results for input(s): "TSH", "T4TOTAL", "FREET4", "T3FREE", "THYROIDAB" in the last 72 hours. Anemia Panel: No results for input(s): "VITAMINB12", "FOLATE", "FERRITIN", "TIBC", "IRON", "RETICCTPCT" in the last 72 hours. Sepsis Labs: No results for input(s): "PROCALCITON", "LATICACIDVEN" in the last 168 hours.   Recent Results (from the past 240 hour(s))  Culture, blood (routine x 2)     Status: None   Collection Time: 09/04/21 12:41 PM   Specimen: BLOOD  Result Value Ref Range Status   Specimen Description   Final    BLOOD LEFT ANTECUBITAL Performed at [facility] [address] [place], [place] [address]    Special Requests   Final    BOTTLES DRAWN AEROBIC AND ANAEROBIC Blood Culture adequate volume Performed at [facility] [address], [place], [place] [address]    Culture   Final    NO GROWTH 5 DAYS Performed at [facility], [place] [address], [place], [place] [address]    Report Status 09/09/2021 FINAL  Final  Resp Panel by RT-PCR (Flu A&B, Covid)     Status: None   Collection Time: 09/04/21  1:46  PM   Specimen: Nasal Swab  Result Value Ref Range Status   SARS Coronavirus 2 by RT PCR NEGATIVE NEGATIVE Final    Comment: (NOTE) SARS-CoV-2 target nucleic acids are NOT DETECTED.  The SARS-CoV-2 RNA is generally detectable in upper respiratory specimens during the acute phase of infection. The lowest concentration of SARS-CoV-2 viral copies this assay can detect is 138 copies/mL. A negative result does not preclude SARS-Cov-2 infection and should not be used as the sole basis for treatment or other patient management decisions. A negative result may occur with  improper specimen  collection/handling, submission of specimen other than nasopharyngeal swab, presence of viral mutation(s) within the areas targeted by this assay, and inadequate number of viral copies(<138 copies/mL). A negative result must be combined with clinical observations, patient history, and epidemiological information. The expected result is Negative.  Fact Sheet for Patients:  EntrepreneurPulse.com.au  Fact Sheet for Healthcare Providers:  IncredibleEmployment.be  This test is no t yet approved or cleared by the [place] FDA and  has been authorized for detection and/or diagnosis of SARS-CoV-2 by FDA under an Emergency Use Authorization (EUA). This EUA will remain  in effect (meaning this test can be used) for the duration of the COVID-19 declaration under Section 564(b)(1) of the Act, 21 U.S.C.section 360bbb-3(b)(1), unless the authorization is terminated  or revoked sooner.       Influenza A by PCR NEGATIVE NEGATIVE Final   Influenza B by PCR NEGATIVE NEGATIVE Final    Comment: (NOTE) The Xpert Xpress SARS-CoV-2/FLU/RSV plus assay is intended as an aid in the diagnosis of influenza from Nasopharyngeal swab specimens and should not be used as a sole basis for treatment. Nasal washings and aspirates are unacceptable for Xpert Xpress SARS-CoV-2/FLU/RSV testing.  Fact Sheet for Patients: EntrepreneurPulse.com.au  Fact Sheet for Healthcare Providers: IncredibleEmployment.be  This test is not yet approved or cleared by the [place] FDA and has been authorized for detection and/or diagnosis of SARS-CoV-2 by FDA under an Emergency Use Authorization (EUA). This EUA will remain in effect (meaning this test can be used) for the duration of the COVID-19 declaration under Section 564(b)(1) of the Act, 21 U.S.C. section 360bbb-3(b)(1), unless the authorization is terminated or revoked.  Performed at [facility], [place] [address] [place], [place] [address]   Culture, blood (routine x 2)     Status: None   Collection Time: 09/04/21  2:00 PM   Specimen: BLOOD  Result Value Ref Range Status   Specimen Description   Final    BLOOD SITE NOT SPECIFIED Performed at [facility] [address], [place], [place] [address]    Special Requests   Final    BOTTLES DRAWN AEROBIC AND ANAEROBIC Blood Culture results may not be optimal due to an excessive volume of blood received in culture bottles Performed at [facility] [address], [place], [place] [address]    Culture   Final    NO GROWTH 5 DAYS Performed at [facility], [place] [address], [place], [place] [address]    Report Status 09/09/2021 FINAL  Final  Urine Culture     Status: Abnormal   Collection Time: 09/04/21  5:03 PM   Specimen: Urine, Catheterized  Result Value Ref Range Status   Specimen Description   Final    URINE, CATHETERIZED Performed at [facility] [address], [place], [place] [address]    Special Requests   Final  NONE Performed at [facility], [place] [address] [place], [place] [address]    Culture (A)  Final    1,000 COLONIES/mL STAPHYLOCOCCUS EPIDERMIDIS CALL MICROBIOLOGY LAB IF SENSITIVITIES ARE REQUIRED. Performed at [facility], [place] [address], [place], [place] [address]    Report Status 09/06/2021 FINAL  Final         Radiology Studies: No results found.      Scheduled Meds:  Chlorhexidine Gluconate Cloth  6 each Topical Daily   feeding supplement  1 Container Oral TID BM   finasteride  5 mg Oral Daily   fluticasone  1 spray Each Nare BID   gabapentin  100 mg Oral BID   loratadine  10 mg Oral Daily   melatonin  3 mg Oral QHS   sertraline  100 mg Oral Daily   tamsulosin  0.8 mg Oral Daily   traZODone  50 mg Oral QHS   Continuous Infusions:          Aline August,  MD [facility] 09/13/2021, 8:12 AM

## 2021-09-14 ENCOUNTER — Inpatient Hospital Stay (HOSPITAL_COMMUNITY): Payer: Medicare HMO

## 2021-09-14 ENCOUNTER — Telehealth: Payer: Self-pay | Admitting: Primary Care

## 2021-09-14 DIAGNOSIS — G9341 Metabolic encephalopathy: Secondary | ICD-10-CM | POA: Diagnosis not present

## 2021-09-14 DIAGNOSIS — R338 Other retention of urine: Secondary | ICD-10-CM | POA: Diagnosis not present

## 2021-09-14 DIAGNOSIS — G928 Other toxic encephalopathy: Secondary | ICD-10-CM | POA: Diagnosis not present

## 2021-09-14 DIAGNOSIS — T50902A Poisoning by unspecified drugs, medicaments and biological substances, intentional self-harm, initial encounter: Secondary | ICD-10-CM | POA: Diagnosis not present

## 2021-09-14 LAB — GLUCOSE, CAPILLARY
Glucose-Capillary: 103 mg/dL — ABNORMAL HIGH (ref 70–99)
Glucose-Capillary: 110 mg/dL — ABNORMAL HIGH (ref 70–99)
Glucose-Capillary: 116 mg/dL — ABNORMAL HIGH (ref 70–99)

## 2021-09-14 NOTE — TOC Progression Note (Addendum)
Transition of Care [facility]) - Progression Note    Patient Details  Name: [name] MRN: [id number] Date of Birth: Dec 22, 1933  [facility]) CM/SW Contact  Leeroy Cha, RN Phone Number: 09/14/2021, 1:50 PM  Clinical Narrative:     Renewal for ivc filled out, signed by m.d. and faxed to the magistrate.  Magistrate will call back if not rec'd within the next 5 minutes. Positive fax response received.  That fax did go to the magistrate office. Tct-magistrate office fax not rec'd Refaxed to the magistrate office-tct-magistrate office still not rec'd Ivc papers emailed to [facility].  Search for geri-psych bed continues.  Tct-magistrate did rec the emailed copy of ivc papers. Papers from the magistrate rec ''d tct-[employer]-will have patient served. 4 copies placed in the ivc chart at the nurses station. Expected Discharge Plan and Services                                                 Social Determinants of Health (SDOH) Interventions    Readmission Risk Interventions   No data to display

## 2021-09-14 NOTE — Telephone Encounter (Signed)
Spoke with [name] via phone regarding her father's hospital admission.

## 2021-09-14 NOTE — Progress Notes (Signed)
Patient drinking resource (thin liquid) between meals. Half way through drink patient started coughing. Patient coughed up large amount of mucus per family. Patient resp WNL with assessment. Patients thinks he may not have tucked chin with drinking. MD made aware of possible aspiration.

## 2021-09-14 NOTE — Telephone Encounter (Signed)
Patient daughter [name] called in and stated that [name] is currently hospitalized. She stated that he tried to overdose and if you wanted to know more and talk to her, you can give her a call at [phone]. Thank you!

## 2021-09-14 NOTE — Care Management Important Message (Signed)
Important Message  Patient Details IM Letter given to Patients Daughter Name: [name] MRN: [id number] Date of Birth: Aug 29, 1933   Medicare Important Message Given:  Yes     Kerin Salen 09/14/2021, 1:21 PM

## 2021-09-14 NOTE — Progress Notes (Signed)
Modified Barium Swallow Progress Note  Patient Details  Name: [name] MRN: [id number] Date of Birth: January 15, 1934  Today's Date: 09/14/2021  Modified Barium Swallow completed.  Full report located under Chart Review in the Imaging Section.  Brief recommendations include the following:  Clinical Impression  Patient presents with a mild-moderate pharyngeal phase dysphagia as per this MBS. His dysphagia is likely chronic as per patient and daughter's reports during initial evaluation of patient coughing while eating for quite some time. Patient exhibited decreased anterior laryngeal movement and decreased tongue base retraction leading to swallow initiation delays to level of vallecular sinus with puree, regular solids, 13 mm barium tablet and nectar thick liquids and delay at level of pyriform sinus with thin liquids. Delay in epiglottic closure of airway resulted in penetration of thin and nectar thick liquids above vocal cords and trace amount of aspiration during and after the swallow with thin liquids. Aspiration was silent while occuring but patient did exhibit a delayed cough response which was ineffective to clear aspirate. Aspiration frequency and amount increased during the course of the study. A chin tuck posture prevented aspiration with thin liquids and although it did not prevent penetration, it did result in decreased depth of penetrate. No aspiration with nectar thick liquids. 13 mm barium tablet became briefly lodged at level of vallecular sinus but cleared fully with sip of nectar thick liquids; esphageal sweep showed what appeared to be normal esopahgeal transit of tablet. SLP is recommending to downgrade patient's liquids to nectar thick at this time to maximize safety and to allow for thin liquids in between meals with close supervision for use of chin tuck posture. If patient is able to consistently perform chin tuck posture, he would be safe for thin liquids with meals as well.  SLP will continue to follow patient for diet toleration.   Swallow Evaluation Recommendations       SLP Diet Recommendations: Regular solids;Nectar thick liquid;Other (Comment) (thin liquids in between meals if full supervision and consistent with chin tuck)   Liquid Administration via: Cup;No straw   Medication Administration: Whole meds with puree   Supervision: Patient able to self feed;Intermittent supervision to cue for compensatory strategies   Compensations: Slow rate;Small sips/bites;Minimize environmental distractions;Chin tuck   Postural Changes: Seated upright at 90 degrees   Oral Care Recommendations: Oral care BID        Sonia Baller, MA, CCC-SLP Speech Therapy

## 2021-09-14 NOTE — Consult Note (Signed)
[facility] Face-to-Face Psychiatry Consult   Reason for Consult: ? Concern for intentional overdose/suicide 87 year old [name] admitted with encephalopathy secondary to presumed intentional opioid overdose. Family found empty bottle of Dilaudid.''  Referring Physician:  [place] Eric, DO Patient Identification: Brandon [name] MRN:  [id number] Principal Diagnosis: Toxic metabolic encephalopathy Diagnosis:  Principal Problem:   Toxic metabolic encephalopathy Active Problems:   Brandon [name] (hyperlipidemia)   Essential hypertension   Coronary atherosclerosis   BPH (benign prostatic hyperplasia)   Drug overdose, intentional, initial encounter (Brandon [name])   Acute metabolic encephalopathy   Major depressive disorder, single episode, severe ([place])   Acute urinary retention   Total Time spent with patient: 30 minutes  Subjective:   Brandon [name] is a 88 y.o. [name] patient admitted with intentional overdose.  HPI:  Brandon [name] is a 88 y.o. [name] with past medical history significant for [name], Brandon [name], Brandon [name], history of choledocholithiasis, history of laparoscopic cholecystectomy, BPH, overactive bladder, OSA not on CPAP, cognitive impairment, Anxiety/depression, Leukemia who presented to [facility] ED on 8/18 via EMS after patient was found with increased lethargy and confusion by his daughter. Daughter reports that patient has been having worsening depression for which he was prescribed Zoloft by PCP. But she reports that her father has been talking about suicidal in the last few months. She states that his driving license was taking away from him due to cognitive limitation and he does not like the idea of having a home health aid caring for him when the family suggested it. Per daughter, he made statement like  "I am going to blow my brains out with a gun" when he was at a bank recently.  Patient's daughter, they found empty Dilaudid container at home; which was apparently his wife's previous medication, she passed 8 years ago from  pancreatic cancer.   On evaluation patient states " I have had better days."  He reports improvement in his delirium " those hallucinations were something terrible.  I hope I do not have to go through that again."  Patient reports being in a much better mood, citing hospital staff and support from family have contributed to his improvement in mood.  He further endorses a reduction in his suicidal thoughts, currently denying any active and or passive suicidal thoughts.  He does endorse wanting to get better, and return back home.  He states he is able to realize how much his family cares about him, and the hurt on their faces when he came into the hospital.  He tells me he is eating and sleeping without difficulty.  He is also tolerating his medications without adverse reactions and or side effects.  He does appear to be future oriented and seeking inpatient psychiatric services, and now appears remorseful for his suicide attempt.  He is able to verbalize his wrongdoing and understand the impact it has on others.  Patient appears motivated to continue with physical therapy, as he feels himself getting stronger and getting his strength back.  Patient is also congratulated on his great progress over the weekend, to include having Foley catheter removed.  Patient does have pictures of his old time cars, and dog at the bedside.  At this time patient continues to meet inpatient psychiatric criteria, although he denies current suicidality he remains in the hospital under involuntary commitment for suicide attempt of high lethality by overdose on medication. Past Psychiatric History: depression  Risk to Self:  yes, unable to contract for safety Risk to Others:  not known Prior  Inpatient Therapy:  none Prior Outpatient Therapy:  none, receives Zoloft from PCP  Past Medical History:  Past Medical History:  Diagnosis Date   Arthralgia of multiple joints    limited mobility w/  independant adl's   BPH with  obstruction/lower urinary tract symptoms    Chronic idiopathic monocytosis    followed by dr Waymon Budge--  per lov note 06/ 2017 persistant unexplained  with normal bone barrow bx   Coronary atherosclerosis of unspecified type of vessel, native or graft    cardiologist-  dr Stanford Breed -- per lov note 11-19-2014 ,  cardiac cath in 2000-- pLAD 60-70%,  small intermediate branch 70-80%,  mRCA 20%,  normal LV   Degenerative arthritis of spine    cervical and lumar   Diverticulosis of colon (without mention of hemorrhage)    Dysuria    chronic   Elevated PSA    Feeling of incomplete bladder emptying    Hiatal hernia    moderate per ct 11/ 2017   History of adenomatous polyp of colon    2008   History of atrial fibrillation    remote hx episode   History of COVID-19 03/05/2020   History of pancreatitis    07-01-2013  and 10-10-2015   History of prostatitis    2014   Hx of colonic polyps    Hyperlipidemia    Hypertension    Mouth sore    roof of mouth sore    Nephrolithiasis    Nocturia more than twice per night    severe  w/ leakage   OSA (obstructive sleep apnea)    INTOLERANT CPAP   Osteoarthritis    Pernicious anemia    B12  Def.   Peyronie disease    Renal insufficiency    Sepsis ([place]) 03/22/2017   Thrombocytopenia, unspecified ([place]) hemotology/oncologist-  dr Waymon Budge   per dr Velta Addison note 06/ 2016  secondary to vitro clumping   Vitamin B deficiency     Past Surgical History:  Procedure Laterality Date   BONE MARROW BIOPSY  2011   normal   CARDIAC CATHETERIZATION  2000   per dr Kathyrn Drown note --  dLAD 60-70%,  small intermediate branch 70-80%,  mRCA 20%,  normal LV   CARDIOVASCULAR STRESS TEST  01-29-2013   dr Stanford Breed   normal nuclear study w/ no ischemia,  normal LV function and wall motion , ef 82%   CATARACT EXTRACTION W/ INTRAOCULAR LENS  IMPLANT, BILATERAL  08/2015   CHOLECYSTECTOMY  11/24/2010   Procedure: LAPAROSCOPIC CHOLECYSTECTOMY WITH  INTRAOPERATIVE CHOLANGIOGRAM;  Surgeon: Earnstine Regal, MD;  Location: [facility];  Service: General;  Laterality: N/A;  c-arm   CYSTOSCOPY W/ URETERAL STENT PLACEMENT Left 12/17/2015   Procedure: CYSTOSCOPY WITH RETROGRADE PYELOGRAM/URETERAL STENT PLACEMENT;  Surgeon: Ardis Hughs, MD;  Location: [facility];  Service: Urology;  Laterality: Left;   CYSTOSCOPY WITH RETROGRADE PYELOGRAM, URETEROSCOPY AND STENT PLACEMENT Left 12/22/2015   Procedure: CYSTOSCOPY WITH LEFT RETROGRADE  URETEROSCOPY AND STENT PLACEMENT;  Surgeon: Carolan Clines, MD;  Location: [facility];  Service: Urology;  Laterality: Left;   ERCP N/A 07/04/2013   Procedure: ENDOSCOPIC RETROGRADE CHOLANGIOPANCREATOGRAPHY (ERCP);  Surgeon: Gatha Mayer, MD;  Location: [facility];  Service: Endoscopy;  Laterality: N/A;  MAC if available   ERCP N/A 10/12/2015   Procedure: ENDOSCOPIC RETROGRADE CHOLANGIOPANCREATOGRAPHY (ERCP);  Surgeon: [name], MD;  Location: [facility];  Service: Endoscopy;  Laterality: N/A;   HOLMIUM LASER APPLICATION Left  12/22/2015   Procedure: HOLMIUM LASER APPLICATION;  Surgeon: Carolan Clines, MD;  Location: [facility];  Service: Urology;  Laterality: Left;   INGUINAL HERNIA REPAIR Right 01/25/2002   KNEE ARTHROSCOPY  x5   SATURATION BIOPSY OF PROSTATE  05-29-2007  and 01-26-2008   SHOULDER ARTHROSCOPY WITH OPEN ROTATOR CUFF REPAIR AND DISTAL CLAVICLE ACROMINECTOMY Right 11/19/2004   TOTAL HIP ARTHROPLASTY Right 05/21/2009   TOTAL KNEE ARTHROPLASTY Bilateral left 05-02-2003/  right  03-23-2010   TRANSTHORACIC ECHOCARDIOGRAM  12/23/2004   ef 60%, mild MV calcification without stenosis/  mild TR,  PASP 58mHg   UVULOPALATOPHARYNGOPLASTY  1993    w/  T & A   Family History:  Family History  Problem Relation Age of Onset   Peripheral vascular disease Mother    Colon cancer Father [phone]  Breast cancer Daughter    Breast cancer Daughter    Family Psychiatric  History:   Social History:  Social History   Substance and Sexual Activity  Alcohol Use No   Alcohol/week: 0.0 standard drinks of alcohol     Social History   Substance and Sexual Activity  Drug Use No    Social History   Socioeconomic History   Marital status: Widowed    Spouse name: Not on file   Number of children: 2   Years of education: Not on file   Highest education level: Not on file  Occupational History   Occupation: retired    [employer] farm manager RETIRED  Tobacco Use   Smoking status: Former    Types: Pipe    Quit date: 01/18/1966    Years since quitting: 55.6   Smokeless tobacco: Never  Vaping Use   Vaping Use: Never used  Substance and Sexual Activity   Alcohol use: No    Alcohol/week: 0.0 standard drinks of alcohol   Drug use: No   Sexual activity: Not Currently  Other Topics Concern   Not on file  Social History Narrative   DNR   Widowed   2 daughters leave near by   Social Determinants of Health   Financial Resource Strain: Low Risk  (08/21/2021)   Overall Financial Resource Strain (CARDIA)    Difficulty of Paying Living Expenses: Not hard at all  Food Insecurity: No Food Insecurity (08/21/2021)   Hunger Vital Sign    Worried About Running Out of Food in the Last Year: Never true    [place] the Last Year: Never true  Transportation Needs: No Transportation Needs (08/21/2021)   PRAPARE - THydrologist(Medical): No    Lack of Transportation (Non-Medical): No  Physical Activity: Inactive (08/21/2021)   Exercise Vital Sign    Days of Exercise per Week: 0 days    Minutes of Exercise per Session: 0 min  Stress: Stress Concern Present (08/21/2021)   [place]   Feeling of Stress : To some extent  Social Connections: Not on file   Additional Social History:    Allergies:   Allergies  Allergen Reactions   Penicillins Hives and Other (See Comments)    [place],  passed out Tolerates cephalosporins  Has patient had a PCN reaction causing immediate rash, facial/tongue/throat swelling, SOB or lightheadedness with hypotension:  yes Has patient had a PCN reaction causing severe rash involving mucus membranes or skin necrosis: no Has patient had a PCN reaction that required hospitalization: no Has patient had a PCN  reaction occurring within the last 10 years: no If all of the above answers are "NO", then may proceed with Cephalosporin use.    Tape Hives and Other (See Comments)    Paper Tape    Labs:  Results for orders placed or performed during the hospital encounter of 09/04/21 (from the past 48 hour(s))  Glucose, capillary     Status: Abnormal   Collection Time: 09/12/21  7:31 PM  Result Value Ref Range   Glucose-Capillary 124 (H) 70 - 99 mg/dL    Comment: Glucose reference range applies only to samples taken after fasting for at least 8 hours.  Glucose, capillary     Status: Abnormal   Collection Time: 09/12/21 11:41 PM  Result Value Ref Range   Glucose-Capillary 145 (H) 70 - 99 mg/dL    Comment: Glucose reference range applies only to samples taken after fasting for at least 8 hours.  Glucose, capillary     Status: None   Collection Time: 09/13/21  3:23 AM  Result Value Ref Range   Glucose-Capillary 88 70 - 99 mg/dL    Comment: Glucose reference range applies only to samples taken after fasting for at least 8 hours.  Glucose, capillary     Status: None   Collection Time: 09/13/21  7:34 AM  Result Value Ref Range   Glucose-Capillary 97 70 - 99 mg/dL    Comment: Glucose reference range applies only to samples taken after fasting for at least 8 hours.   Comment 1 Notify RN   Glucose, capillary     Status: Abnormal   Collection Time: 09/13/21 12:02 PM  Result Value Ref Range   Glucose-Capillary 127 (H) 70 - 99 mg/dL    Comment: Glucose reference range applies only to samples taken after fasting for at least 8 hours.   Comment 1 Notify RN    Glucose, capillary     Status: Abnormal   Collection Time: 09/13/21  6:56 PM  Result Value Ref Range   Glucose-Capillary 165 (H) 70 - 99 mg/dL    Comment: Glucose reference range applies only to samples taken after fasting for at least 8 hours.   Comment 1 Notify RN   Glucose, capillary     Status: Abnormal   Collection Time: 09/13/21  8:27 PM  Result Value Ref Range   Glucose-Capillary 136 (H) 70 - 99 mg/dL    Comment: Glucose reference range applies only to samples taken after fasting for at least 8 hours.   Comment 1 Notify RN    Comment 2 Document in Chart   Glucose, capillary     Status: Abnormal   Collection Time: 09/14/21 12:10 AM  Result Value Ref Range   Glucose-Capillary 116 (H) 70 - 99 mg/dL    Comment: Glucose reference range applies only to samples taken after fasting for at least 8 hours.   Comment 1 Notify RN    Comment 2 Document in Chart   Glucose, capillary     Status: Abnormal   Collection Time: 09/14/21  4:53 AM  Result Value Ref Range   Glucose-Capillary 110 (H) 70 - 99 mg/dL    Comment: Glucose reference range applies only to samples taken after fasting for at least 8 hours.   Comment 1 Notify RN    Comment 2 Document in Chart   Glucose, capillary     Status: Abnormal   Collection Time: 09/14/21  8:29 AM  Result Value Ref Range   Glucose-Capillary 103 (H) 70 - 99  mg/dL    Comment: Glucose reference range applies only to samples taken after fasting for at least 8 hours.    Current Facility-Administered Medications  Medication Dose Route Frequency Provider Last Rate Last Admin   acetaminophen (TYLENOL) tablet 650 mg  650 mg Oral Q6H PRN [place] Eric J, DO   650 mg at 09/12/21 0175   Or   acetaminophen (TYLENOL) suppository 650 mg  650 mg Rectal Q6H PRN [place] Eric J, DO   650 mg at 09/04/21 2313   albuterol (PROVENTIL) (2.5 MG/3ML) 0.083% nebulizer solution 2.5 mg  2.5 mg Nebulization Q2H PRN [place] Eric J, DO       feeding supplement (BOOST /  RESOURCE BREEZE) liquid 1 Container  1 Container Oral TID BM [place] Donnamarie Poag, DO   1 Container at 09/13/21 2127   finasteride (PROSCAR) tablet 5 mg  5 mg Oral Daily [place] Donnamarie Poag, DO   5 mg at 09/14/21 0954   fluticasone (FLONASE) 50 MCG/ACT nasal spray 1 spray  1 spray Each Nare BID [place] Donnamarie Poag, DO   1 spray at 09/14/21 1004   gabapentin (NEURONTIN) capsule 100 mg  100 mg Oral BID Suella Broad, FNP   100 mg at 09/14/21 0954   loratadine (CLARITIN) tablet 10 mg  10 mg Oral Daily [place] Eric J, DO   10 mg at 09/14/21 1025   LORazepam (ATIVAN) tablet 0.5 mg  0.5 mg Oral Q6H PRN [place] Eric J, DO   0.5 mg at 09/08/21 1214   melatonin tablet 3 mg  3 mg Oral QHS [place] Donnamarie Poag, DO   3 mg at 09/13/21 2123   naloxone [facility]) injection 0.4 mg  0.4 mg Intravenous PRN [place] Donnamarie Poag, DO       ondansetron [facility]) tablet 4 mg  4 mg Oral Q6H PRN [place] Eric J, DO       Or   ondansetron [facility]) injection 4 mg  4 mg Intravenous Q6H PRN [place] Donnamarie Poag, DO   4 mg at 09/06/21 1906   polyethylene glycol (MIRALAX / GLYCOLAX) packet 17 g  17 g Oral Daily PRN [place] Eric J, DO       polyvinyl alcohol (LIQUIFILM TEARS) 1.4 % ophthalmic solution 1 drop  1 drop Both Eyes PRN [place] Donnamarie Poag, DO   1 drop at 09/09/21 8527   sertraline (ZOLOFT) tablet 100 mg  100 mg Oral Daily Suella Broad, FNP   100 mg at 09/14/21 7824   tamsulosin (FLOMAX) capsule 0.8 mg  0.8 mg Oral Daily [place] Eric J, DO   0.8 mg at 09/14/21 2353   traZODone (DESYREL) tablet 50 mg  50 mg Oral QHS [place] Eric J, DO   50 mg at 09/13/21 2123    Musculoskeletal: Strength & Muscle Tone: within normal limits Gait & Station: unsteady Patient leans: N/A    Psychiatric Specialty Exam:  Presentation  General Appearance: Appropriate for Environment; Casual  Eye Contact:Fair  Speech:Clear and Coherent; Normal Rate  Speech Volume:Normal  Handedness:Right   Mood and Affect  Mood:Anxious;  Depressed  Affect:Congruent; Appropriate   Thought Process  Thought Processes:Coherent; Linear  Descriptions of Associations:Intact  Orientation:Full (Time, Place and Person)  Thought Content:Logical  History of Schizophrenia/Schizoaffective disorder:No data recorded Duration of Psychotic Symptoms:No data recorded Hallucinations:Hallucinations: None  Ideas of Reference:None  Suicidal Thoughts:Suicidal Thoughts: No  Homicidal Thoughts:Homicidal Thoughts: No   Sensorium  Memory:Immediate Fair; Recent Fair; Remote Fair  Judgment:Fair  Insight:Shallow   Community education officer  Concentration:Fair  Attention Span:Fair  [place]   Psychomotor Activity  Psychomotor Activity:Psychomotor Activity: Normal   Assets  Assets:Communication Skills; Desire for Improvement; Financial Resources/Insurance; Housing; Leisure Time; Physical Health; Resilience; Social Support   Sleep  Sleep:Sleep: Fair   Physical Exam: Physical Exam Vitals and nursing note reviewed.  Constitutional:      Appearance: Normal appearance. He is normal weight.  Skin:    General: Skin is warm.     Capillary Refill: Capillary refill takes less than 2 seconds.  Neurological:     General: No focal deficit present.     Mental Status: He is alert and oriented to person, place, and time. Mental status is at baseline.  Psychiatric:        Mood and Affect: Mood normal.        Behavior: Behavior normal.        Thought Content: Thought content normal.        Judgment: Judgment normal.    Review of Systems  Psychiatric/Behavioral:  Positive for depression and suicidal ideas. The patient is nervous/anxious.    Blood pressure (!) 120/54, pulse 81, temperature 98.1 F (36.7 C), temperature source Oral, resp. rate 18, height 5' (1.524 m), weight 59.2 kg, SpO2 98 %. Body mass index is 25.49 kg/m.  Treatment Plan Summary: 88 year old [name] with multiple  medical problems with worsening depressive symptoms. He was admitted to the hospital after he intentionally attempted suicide by overdosing on medication. Today, patient is unable to contract for safety, he will benefit from psychiatric inpatient admission after he is medically stabilized.  Plan/Recommendations: -Continue 1:1 sitter for safety -Continue Zoloft 100 mg p.o. daily for depression and suicidal thoughts. -Will continue gabapentin 100 mg p.o. twice daily for anxiety, mood, and GABA related effects to include pain management. -TOC consult for inpatient geriatric psych hospitalization referral.  Patient is now medically stable.  Foley catheter has also been removed.  Patient is able to ambulate with walker, and continues to progress with physical therapy and other acute care services. Continue IVC, as patient remains high risk for suicide completion. -Initiate delirium precautions.   Disposition: Recommend psychiatric Inpatient admission when medically cleared. Supportive therapy provided about ongoing stressors. Psychiatric service will follow the patient in order to monitor his progress  Suella Broad, FNP 09/14/2021 5:27 PM

## 2021-09-14 NOTE — Progress Notes (Signed)
Speech Language Pathology Treatment: Dysphagia  Patient Details Name: [name] MRN: [id number] DOB: 1933-05-13 Today's Date: 09/14/2021 Time: 0900-0920 SLP Time Calculation (min) (ACUTE ONLY): 20 min  Assessment / Plan / Recommendation Clinical Impression  Patient seen with daughter present in room, for SLP treatment focused on dysphagia goals and education/discussion regarding results of MBS. Patient was able to demonstrate chin tuck posture while drinking liquids, however he did have instance of coughing which appears to have been from him lifting head back up while still completing swallow.  SLP discussed with patient and daughter that patient's dysphagia is likely chronic but when he is in a more deconditioned state, he is more susceptible to PNA from aspiration. SLP reviewed recommendations for patient to have nectar thick liquids with meals and that he can have thin liquids in between meals but he will have to have very close supervision for use of chin tuck posture. SLP will continue to follow patient while in hospital and recommending f/u SLP at next venue of care [facility]).   HPI HPI: Patient is an 88 y.o. male with PMH: HTN, HLD, CAD, OSA not on CPAP, cognitive impairment, anxiety/depression, h/o Patient has h/o uvulectomy and tonsilectomy, who presented to [facility] ED on 8/18 via EMS after patient was found by his daughter with increased confusion and lethargy. Patient with recent hospitalizations for similar events with previous reports of hallucinations as well. Daughters also reported that patient had increased SI and his daughter found an empty container of Dilaudid at home which was his wife's medication (she passed away two years ago)   and there is concern of an intentional overdose. In ED, patient was afebrile, satting 92% on RA. CT head negative for acute intracranial process, CXR not showing acute cardiopulmonary disease process. Patient was admitted for work-up of acute metabolic  encephalopathy. Patient was coughing a lot with medications in morning of 09/11/21 and as patient and daughter report h/o swallowing difficulties, SLP was ordered to evaluate swallow function.      SLP Plan  Continue with current plan of care      Recommendations for follow up therapy are one component of a multi-disciplinary discharge planning process, led by the attending physician.  Recommendations may be updated based on patient status, additional functional criteria and insurance authorization.    Recommendations  Diet recommendations: Regular;Nectar-thick liquid Liquids provided via: Cup;No straw Medication Administration: Whole meds with liquid Supervision: Patient able to self feed;Intermittent supervision to cue for compensatory strategies Compensations: Slow rate;Small sips/bites;Minimize environmental distractions;Chin tuck Postural Changes and/or Swallow Maneuvers: Seated upright 90 degrees                Oral Care Recommendations: Oral care BID Follow Up Recommendations: Other (comment) Assistance recommended at discharge: Frequent or constant Supervision/Assistance SLP Visit Diagnosis: Dysphagia, pharyngeal phase (R13.13) Plan: Continue with current plan of care           Sonia Baller, MA, CCC-SLP Speech Therapy

## 2021-09-14 NOTE — Progress Notes (Signed)
PROGRESS NOTE    [name]  FYB:[id number] DOB: 04/30/1933 DOA: 09/04/2021 PCP: Pleas Koch, NP   Brief Narrative:  88 y.o. male with past medical history significant for HTN, HLD, CAD, choledocholithiasis, laparoscopic cholecystectomy, BPH, overactive bladder, OSA not on CPAP, cognitive impairment, anxiety/depression, Leukemia presented on 09/04/2021 with increased lethargy and confusion, hallucinations with possible suicidal ideation.  Patient's daughter found empty Dilaudid container at home, which was apparently his wife's previous medication (wife passed away 2 years ago from pancreatic cancer).  There was a concern for intentional overdose.  On presentation, CT of the head without contrast was without acute intracranial abnormity.  Chest x-ray showed no acute cardiopulmonary disease process.  Patient was IVC'd.  Psychiatry was consulted.  Psychiatry recommended transfer to [place] facility.  Currently medically stable for discharge.  Assessment & Plan:   Acute metabolic/toxic encephalopathy secondary to possible intentional opiate overdose Suicide attempt, continued ideations Anxiety/depression -Psychiatry following.  Currently IVCD'd.  Psychiatry recommending transfer to [facility] facility.  Social worker following. -Continue suicide precautions, one-to-one sitter. -Currently on trazodone, sertraline, gabapentin as per psychiatry -Continue as needed Ativan for agitation -Currently medically stable for discharge  Aspiration pneumonia -Status post 5 days of antibiotics, antibiotics discontinued on 09/09/2021 -Currently on room air.  Diet as per SLP recommendations.  Leukocytosis -Blood cultures negative so far.  Urine culture grew Staph epidermidis: Likely contaminant -No labs recently.  Acute urinary retention Hematuria BPH -Hematuria likely secondary to traumatic Foley placement in the ED.  Attempted to discontinue Foley catheter with continued urinary retention  and hematuria.  Urology consulted and patient underwent coud catheter placement by Dr. Alyson Ingles on 09/05/2021. -Continue Flomax and finasteride. -On 09/06/2021: Urology had recommended that Foley should remain in place for 5 to 7 days prior to attempting a voiding trial.  After communicating with Dr. Alyson Ingles via secure chat on 09/11/2021, Foley catheter was removed on 09/11/2021 and patient is voiding well subsequently.  Outpatient follow-up with urology.  Thrombocytopenia -Questionable cause.  No recent labs.  Left otitis externa -Continue Ciprodex eardrops  CAD/hypertension/hyperlipidemia -Currently not on any medication at this time.  Outpatient follow-up with PCP   DVT prophylaxis: SCDs Code Status: DNR Family Communication: Daughter/Sharon at bedside on 09/14/2021 Disposition Plan: Status is: Inpatient Remains inpatient appropriate because: Of need for [facility] psych placement.  Currently medically stable for discharge    Consultants: Psychiatry/urology  Procedures: None  Antimicrobials:  Anti-infectives (From admission, onward)    Start     Dose/Rate Route Frequency Ordered Stop   09/05/21 1115  cefTRIAXone (ROCEPHIN) 2 g in sodium chloride 0.9 % 100 mL IVPB        2 g 200 mL/hr over 30 Minutes Intravenous Every 24 hours 09/05/21 1019 09/09/21 1203   09/05/21 1115  metroNIDAZOLE (FLAGYL) IVPB 500 mg  Status:  Discontinued        500 mg 100 mL/hr over 60 Minutes Intravenous Every 12 hours 09/05/21 1019 09/09/21 1418        Subjective: Patient seen and examined at bedside.  Hard of hearing.  No agitation, seizures, fever or vomiting reported.   Objective: Vitals:   09/13/21 2147 09/13/21 2149 09/13/21 2300 09/14/21 0619  BP: (!) 100/44 (!) 105/45 (!) 110/40 (!) 125/53  Pulse: 88   70  Resp: 18   18  Temp: 98.3 F (36.8 C)   97.7 F (36.5 C)  TempSrc: Oral   Oral  SpO2: 95%   96%  Weight:  Height:        Intake/Output Summary (Last 24 hours) at 09/14/2021  0749 Last data filed at 09/14/2021 0457 Gross per 24 hour  Intake 820 ml  Output --  Net 820 ml    Filed Weights   09/07/21 1151 09/10/21 1042  Weight: 59.2 kg 59.2 kg    Examination:  General: No acute distress.  Currently on room air.  Hard of hearing.  Wakes up slightly, still slow to respond.  Poor historian.  Flat affect. respiratory: Bilateral decreased breath sounds at bases with some scattered crackles  CVS: S1-S2 heard; rate controlled currently  abdominal: Soft, nontender, still mildly distended; no organomegaly; bowel sounds heard normally extremities: No cyanosis; mild lower extremity edema present   Data Reviewed: I have personally reviewed following labs and imaging studies  CBC: Recent Labs  Lab 09/08/21 0452 09/09/21 0529  WBC 14.9* 15.5*  NEUTROABS 10.2*  --   HGB 11.6* 12.0*  HCT 36.9* 39.2  MCV 85.8 87.7  PLT 44* 54*    Basic Metabolic Panel: Recent Labs  Lab 09/08/21 0452 09/09/21 0529  NA 140 143  K 3.4* 3.5  CL 115* 116*  CO2 20* 23  GLUCOSE 95 105*  BUN 9 8  CREATININE 0.71 0.69  CALCIUM 8.1* 8.2*  MG  --  1.9    GFR: Estimated Creatinine Clearance: 47.7 mL/min (by C-G formula based on SCr of 0.69 mg/dL). Liver Function Tests: No results for input(s): "AST", "ALT", "ALKPHOS", "BILITOT", "PROT", "ALBUMIN" in the last 168 hours.  No results for input(s): "LIPASE", "AMYLASE" in the last 168 hours. No results for input(s): "AMMONIA" in the last 168 hours. Coagulation Profile: No results for input(s): "INR", "PROTIME" in the last 168 hours. Cardiac Enzymes: No results for input(s): "CKTOTAL", "CKMB", "CKMBINDEX", "TROPONINI" in the last 168 hours. BNP (last 3 results) No results for input(s): "PROBNP" in the last 8760 hours. HbA1C: No results for input(s): "HGBA1C" in the last 72 hours. CBG: Recent Labs  Lab 09/13/21 1202 09/13/21 1856 09/13/21 2027 09/14/21 0010 09/14/21 0453  GLUCAP 127* 165* 136* 116* 110*    Lipid  Profile: No results for input(s): "CHOL", "HDL", "LDLCALC", "TRIG", "CHOLHDL", "LDLDIRECT" in the last 72 hours. Thyroid Function Tests: No results for input(s): "TSH", "T4TOTAL", "FREET4", "T3FREE", "THYROIDAB" in the last 72 hours. Anemia Panel: No results for input(s): "VITAMINB12", "FOLATE", "FERRITIN", "TIBC", "IRON", "RETICCTPCT" in the last 72 hours. Sepsis Labs: No results for input(s): "PROCALCITON", "LATICACIDVEN" in the last 168 hours.   Recent Results (from the past 240 hour(s))  Culture, blood (routine x 2)     Status: None   Collection Time: 09/04/21 12:41 PM   Specimen: BLOOD  Result Value Ref Range Status   Specimen Description   Final    BLOOD LEFT ANTECUBITAL Performed at [facility] [address] [place], [place] [address]    Special Requests   Final    BOTTLES DRAWN AEROBIC AND ANAEROBIC Blood Culture adequate volume Performed at [facility] [address] [place], [place] [address]    Culture   Final    NO GROWTH 5 DAYS Performed at [facility], [place] [address], [place], [place] [address]    Report Status 09/09/2021 FINAL  Final  Resp Panel by RT-PCR (Flu A&B, Covid)     Status: None   Collection Time: 09/04/21  1:46 PM   Specimen: Nasal Swab  Result Value Ref Range Status   SARS Coronavirus 2 by  RT PCR NEGATIVE NEGATIVE Final    Comment: (NOTE) SARS-CoV-2 target nucleic acids are NOT DETECTED.  The SARS-CoV-2 RNA is generally detectable in upper respiratory specimens during the acute phase of infection. The lowest concentration of SARS-CoV-2 viral copies this assay can detect is 138 copies/mL. A negative result does not preclude SARS-Cov-2 infection and should not be used as the sole basis for treatment or other patient management decisions. A negative result may occur with  improper specimen collection/handling, submission of specimen other than nasopharyngeal swab, presence of viral mutation(s)  within the areas targeted by this assay, and inadequate number of viral copies(<138 copies/mL). A negative result must be combined with clinical observations, patient history, and epidemiological information. The expected result is Negative.  Fact Sheet for Patients:  EntrepreneurPulse.com.au  Fact Sheet for Healthcare Providers:  IncredibleEmployment.be  This test is no t yet approved or cleared by the [place] FDA and  has been authorized for detection and/or diagnosis of SARS-CoV-2 by FDA under an Emergency Use Authorization (EUA). This EUA will remain  in effect (meaning this test can be used) for the duration of the COVID-19 declaration under Section 564(b)(1) of the Act, 21 U.S.C.section 360bbb-3(b)(1), unless the authorization is terminated  or revoked sooner.       Influenza A by PCR NEGATIVE NEGATIVE Final   Influenza B by PCR NEGATIVE NEGATIVE Final    Comment: (NOTE) The Xpert Xpress SARS-CoV-2/FLU/RSV plus assay is intended as an aid in the diagnosis of influenza from Nasopharyngeal swab specimens and should not be used as a sole basis for treatment. Nasal washings and aspirates are unacceptable for Xpert Xpress SARS-CoV-2/FLU/RSV testing.  Fact Sheet for Patients: EntrepreneurPulse.com.au  Fact Sheet for Healthcare Providers: IncredibleEmployment.be  This test is not yet approved or cleared by the [place] FDA and has been authorized for detection and/or diagnosis of SARS-CoV-2 by FDA under an Emergency Use Authorization (EUA). This EUA will remain in effect (meaning this test can be used) for the duration of the COVID-19 declaration under Section 564(b)(1) of the Act, 21 U.S.C. section 360bbb-3(b)(1), unless the authorization is terminated or revoked.  Performed at [facility], [place] [address], [place], [place] [address]   Culture, blood (routine x 2)      Status: None   Collection Time: 09/04/21  2:00 PM   Specimen: BLOOD  Result Value Ref Range Status   Specimen Description   Final    BLOOD SITE NOT SPECIFIED Performed at [facility] [address], [place], [place] [address]    Special Requests   Final    BOTTLES DRAWN AEROBIC AND ANAEROBIC Blood Culture results may not be optimal due to an excessive volume of blood received in culture bottles Performed at [facility] [address], [place], [place] [address]    Culture   Final    NO GROWTH 5 DAYS Performed at [facility], [place] [address], [place], [place] [address]    Report Status 09/09/2021 FINAL  Final  Urine Culture     Status: Abnormal   Collection Time: 09/04/21  5:03 PM   Specimen: Urine, Catheterized  Result Value Ref Range Status   Specimen Description   Final    URINE, CATHETERIZED Performed at [facility] [address], [place], [place] [address]    Special Requests   Final    NONE Performed at [facility], [place] [address], [place], [place] [address]  Culture (A)  Final    1,000 COLONIES/mL STAPHYLOCOCCUS EPIDERMIDIS CALL MICROBIOLOGY LAB IF SENSITIVITIES ARE REQUIRED. Performed at [facility], [place] [address] [place], [place] [address]    Report Status 09/06/2021 FINAL  Final         Radiology Studies: No results found.      Scheduled Meds:  Chlorhexidine Gluconate Cloth  6 each Topical Daily   feeding supplement  1 Container Oral TID BM   finasteride  5 mg Oral Daily   fluticasone  1 spray Each Nare BID   gabapentin  100 mg Oral BID   loratadine  10 mg Oral Daily   melatonin  3 mg Oral QHS   sertraline  100 mg Oral Daily   tamsulosin  0.8 mg Oral Daily   traZODone  50 mg Oral QHS   Continuous Infusions:          Aline August, MD [facility] 09/14/2021, 7:49 AM

## 2021-09-14 NOTE — Progress Notes (Signed)
Mobility Specialist - Progress Note     09/14/21 1027  Mobility  Activity Ambulated with assistance in hallway  Level of Assistance Standby assist, set-up cues, supervision of patient - no hands on  Assistive Device Front wheel walker  Distance Ambulated (ft) 200 ft  Activity Response Tolerated well  $Mobility charge 1 Mobility   Pt was found in restroom with NT and agreeable to mobilize. Pt stated feeling weak today and at EOS returned to bed with all necessities, family, and NT in room.  Ferd Hibbs Mobility Specialist

## 2021-09-15 ENCOUNTER — Ambulatory Visit: Payer: Medicare HMO | Admitting: Primary Care

## 2021-09-15 DIAGNOSIS — N401 Enlarged prostate with lower urinary tract symptoms: Secondary | ICD-10-CM | POA: Diagnosis not present

## 2021-09-15 DIAGNOSIS — R338 Other retention of urine: Secondary | ICD-10-CM | POA: Diagnosis not present

## 2021-09-15 DIAGNOSIS — G928 Other toxic encephalopathy: Secondary | ICD-10-CM | POA: Diagnosis not present

## 2021-09-15 DIAGNOSIS — G9341 Metabolic encephalopathy: Secondary | ICD-10-CM | POA: Diagnosis not present

## 2021-09-15 NOTE — Progress Notes (Signed)
Mobility Specialist - Progress Note     09/15/21 1533  Mobility  Activity Ambulated with assistance in hallway  Level of Assistance Standby assist, set-up cues, supervision of patient - no hands on  Assistive Device Front wheel walker  Distance Ambulated (ft) 500 ft  Activity Response Tolerated well  $Mobility charge 1 Mobility   Pt was found in the bathroom with NT and agreeable to mobilize. Had no complaints during ambulation and at EOS was left with all necessities in reach.  Ferd Hibbs Mobility Specialist

## 2021-09-15 NOTE — Consult Note (Signed)
[facility] Face-to-Face Psychiatry Consult   Reason for Consult: ? Concern for intentional overdose/suicide 88 year old male admitted with encephalopathy secondary to presumed intentional opioid overdose. Family found empty bottle of Dilaudid.''  Referring Physician:  [place] Eric, DO Patient Identification: [name] MRN:  [id number] Principal Diagnosis: Toxic metabolic encephalopathy Diagnosis:  Principal Problem:   Toxic metabolic encephalopathy Active Problems:   HLD (hyperlipidemia)   Essential hypertension   Coronary atherosclerosis   BPH (benign prostatic hyperplasia)   Drug overdose, intentional, initial encounter ([place])   Acute metabolic encephalopathy   Major depressive disorder, single episode, severe ([place])   Acute urinary retention   Total Time spent with patient: 30 minutes  Subjective:   [name] is a 88 y.o. male patient admitted with intentional overdose.  88 year old male with no previous past psychiatric history who presents to the emergency department after suicide attempt by overdose on pain medications. He endorses ongoing suffering and pain that lead to his attempt. He endorses " wreckless and impulsive attempt not well thought out. I dint understand the ramifications and or punishment associated with a suicide attempt.  I went to see an orthopedic about my shoulder, and he said I can fix your shoulder but I am afraid you might not wake up.  For some reason at that time I thought that was a good way to go, so went home and took medication with all intentions of going to sleep and not waking up." He remains remorseful for his actions, and shows improved insight at this time.  He reports prior to this suicide attempt, he has never had these thoughts before.  He currently denies suicidal ideation, suicidal thoughts, and or self-harm.  He is able to contract for safety while on the unit, and remains under one-to-one observation at this time due to his suicide attempt  of high lethality.   Patient is able to vocalize his pain, grief, and loss of independence as major contributing factors to his worsening suicidal ideations and worsening depressive symptoms at that time.  While patient has endorsed improvement in his suicidality, second to pain management ; he continues to benefit from inpatient psychiatric hospitalization for crisis stabilization, coping skills and therapy, and affective medication management.  Patient endorses a sense of loss and autonomy, independence, overall functioning related to his inoperable shoulder/rotator cuff diagnosis, however he does have some motivation and appears future oriented to seek appropriate help in the inpatient setting.    HPI:  Mr. Edmonds is a 88 y.o. male with past medical history significant for HTN, HLD, CAD, history of choledocholithiasis, history of laparoscopic cholecystectomy, BPH, overactive bladder, OSA not on CPAP, cognitive impairment, Anxiety/depression, Leukemia who presented to [facility] ED on 8/18 via EMS after patient was found with increased lethargy and confusion by his daughter. Daughter reports that patient has been having worsening depression for which he was prescribed Zoloft by PCP. But she reports that her father has been talking about suicidal in the last few months. She states that his driving license was taking away from him due to cognitive limitation and he does not like the idea of having a home health aid caring for him when the family suggested it. Per daughter, he made statement like  "I am going to blow my brains out with a gun" when he was at a bank recently.  Patient's daughter, they found empty Dilaudid container at home; which was apparently his wife's previous medication, she passed 8 years ago from pancreatic cancer.  Past Psychiatric History: depression  Risk to Self:  yes, unable to contract for safety Risk to Others:  not known Prior Inpatient Therapy:  none Prior Outpatient Therapy:   none, receives Zoloft from PCP  Past Medical History:  Past Medical History:  Diagnosis Date   Arthralgia of multiple joints    limited mobility w/  independant adl's   BPH with obstruction/lower urinary tract symptoms    Chronic idiopathic monocytosis    followed by dr Waymon Budge--  per lov note 06/ 2017 persistant unexplained  with normal bone barrow bx   Coronary atherosclerosis of unspecified type of vessel, native or graft    cardiologist-  dr Stanford Breed -- per lov note 11-19-2014 ,  cardiac cath in 2000-- pLAD 60-70%,  small intermediate branch 70-80%,  mRCA 20%,  normal LV   Degenerative arthritis of spine    cervical and lumar   Diverticulosis of colon (without mention of hemorrhage)    Dysuria    chronic   Elevated PSA    Feeling of incomplete bladder emptying    Hiatal hernia    moderate per ct 11/ 2017   History of adenomatous polyp of colon    2008   History of atrial fibrillation    remote hx episode   History of COVID-19 03/05/2020   History of pancreatitis    07-01-2013  and 10-10-2015   History of prostatitis    2014   Hx of colonic polyps    Hyperlipidemia    Hypertension    Mouth sore    roof of mouth sore    Nephrolithiasis    Nocturia more than twice per night    severe  w/ leakage   OSA (obstructive sleep apnea)    INTOLERANT CPAP   Osteoarthritis    Pernicious anemia    B12  Def.   Peyronie disease    Renal insufficiency    Sepsis ([place]) 03/22/2017   Thrombocytopenia, unspecified ([place]) hemotology/oncologist-  dr Waymon Budge   per dr Velta Addison note 06/ 2016  secondary to vitro clumping   Vitamin B deficiency     Past Surgical History:  Procedure Laterality Date   BONE MARROW BIOPSY  2011   normal   CARDIAC CATHETERIZATION  2000   per dr Kathyrn Drown note --  dLAD 60-70%,  small intermediate branch 70-80%,  mRCA 20%,  normal LV   CARDIOVASCULAR STRESS TEST  01-29-2013   dr Stanford Breed   normal nuclear study w/ no ischemia,  normal LV  function and wall motion , ef 82%   CATARACT EXTRACTION W/ INTRAOCULAR LENS  IMPLANT, BILATERAL  08/2015   CHOLECYSTECTOMY  11/24/2010   Procedure: LAPAROSCOPIC CHOLECYSTECTOMY WITH INTRAOPERATIVE CHOLANGIOGRAM;  Surgeon: Earnstine Regal, MD;  Location: [facility];  Service: General;  Laterality: N/A;  c-arm   CYSTOSCOPY W/ URETERAL STENT PLACEMENT Left 12/17/2015   Procedure: CYSTOSCOPY WITH RETROGRADE PYELOGRAM/URETERAL STENT PLACEMENT;  Surgeon: Ardis Hughs, MD;  Location: [facility];  Service: Urology;  Laterality: Left;   CYSTOSCOPY WITH RETROGRADE PYELOGRAM, URETEROSCOPY AND STENT PLACEMENT Left 12/22/2015   Procedure: CYSTOSCOPY WITH LEFT RETROGRADE  URETEROSCOPY AND STENT PLACEMENT;  Surgeon: Carolan Clines, MD;  Location: [facility];  Service: Urology;  Laterality: Left;   ERCP N/A 07/04/2013   Procedure: ENDOSCOPIC RETROGRADE CHOLANGIOPANCREATOGRAPHY (ERCP);  Surgeon: Gatha Mayer, MD;  Location: [facility];  Service: Endoscopy;  Laterality: N/A;  MAC if available   ERCP N/A 10/12/2015   Procedure: ENDOSCOPIC RETROGRADE CHOLANGIOPANCREATOGRAPHY (ERCP);  Surgeon: [name], MD;  Location: [facility];  Service: Endoscopy;  Laterality: N/A;   HOLMIUM LASER APPLICATION Left 03/23/4654   Procedure: HOLMIUM LASER APPLICATION;  Surgeon: Carolan Clines, MD;  Location: [facility];  Service: Urology;  Laterality: Left;   INGUINAL HERNIA REPAIR Right 01/25/2002   KNEE ARTHROSCOPY  x5   SATURATION BIOPSY OF PROSTATE  05-29-2007  and 01-26-2008   SHOULDER ARTHROSCOPY WITH OPEN ROTATOR CUFF REPAIR AND DISTAL CLAVICLE ACROMINECTOMY Right 11/19/2004   TOTAL HIP ARTHROPLASTY Right 05/21/2009   TOTAL KNEE ARTHROPLASTY Bilateral left 05-02-2003/  right  03-23-2010   TRANSTHORACIC ECHOCARDIOGRAM  12/23/2004   ef 60%, mild MV calcification without stenosis/  mild TR,  PASP 33mHg   UVULOPALATOPHARYNGOPLASTY  1993    w/  T & A   Family History:  Family History   Problem Relation Age of Onset   Peripheral vascular disease Mother    Colon cancer Father 436  Breast cancer Daughter    Breast cancer Daughter    Family Psychiatric  History:  Social History:  Social History   Substance and Sexual Activity  Alcohol Use No   Alcohol/week: 0.0 standard drinks of alcohol     Social History   Substance and Sexual Activity  Drug Use No    Social History   Socioeconomic History   Marital status: Widowed    Spouse name: Not on file   Number of children: 2   Years of education: Not on file   Highest education level: Not on file  Occupational History   Occupation: retired    [employer] farm manager RETIRED  Tobacco Use   Smoking status: Former    Types: Pipe    Quit date: 01/18/1966    Years since quitting: 55.6   Smokeless tobacco: Never  Vaping Use   Vaping Use: Never used  Substance and Sexual Activity   Alcohol use: No    Alcohol/week: 0.0 standard drinks of alcohol   Drug use: No   Sexual activity: Not Currently  Other Topics Concern   Not on file  Social History Narrative   DNR   Widowed   2 daughters leave near by   Social Determinants of Health   Financial Resource Strain: Low Risk  (08/21/2021)   Overall Financial Resource Strain (CARDIA)    Difficulty of Paying Living Expenses: Not hard at all  Food Insecurity: No Food Insecurity (08/21/2021)   Hunger Vital Sign    Worried About Running Out of Food in the Last Year: Never true    [place] the Last [place]: Never true  Transportation Needs: No Transportation Needs (08/21/2021)   PRAPARE - THydrologist(Medical): No    Lack of Transportation (Non-Medical): No  Physical Activity: Inactive (08/21/2021)   Exercise Vital Sign    Days of Exercise per Week: 0 days    Minutes of Exercise per Session: 0 min  Stress: Stress Concern Present (08/21/2021)   [place]   Feeling of Stress : To some  extent  Social Connections: Not on file   Additional Social History:    Allergies:   Allergies  Allergen Reactions   Penicillins Hives and Other (See Comments)    [place], passed out Tolerates cephalosporins  Has patient had a PCN reaction causing immediate rash, facial/tongue/throat swelling, SOB or lightheadedness with hypotension:  yes Has patient had a PCN reaction causing severe rash involving  mucus membranes or skin necrosis: no Has patient had a PCN reaction that required hospitalization: no Has patient had a PCN reaction occurring within the last 10 years: no If all of the above answers are "NO", then may proceed with Cephalosporin use.    Tape Hives and Other (See Comments)    Paper Tape    Labs:  Results for orders placed or performed during the hospital encounter of 09/04/21 (from the past 48 hour(s))  Glucose, capillary     Status: Abnormal   Collection Time: 09/13/21  6:56 PM  Result Value Ref Range   Glucose-Capillary 165 (H) 70 - 99 mg/dL    Comment: Glucose reference range applies only to samples taken after fasting for at least 8 hours.   Comment 1 Notify RN   Glucose, capillary     Status: Abnormal   Collection Time: 09/13/21  8:27 PM  Result Value Ref Range   Glucose-Capillary 136 (H) 70 - 99 mg/dL    Comment: Glucose reference range applies only to samples taken after fasting for at least 8 hours.   Comment 1 Notify RN    Comment 2 Document in Chart   Glucose, capillary     Status: Abnormal   Collection Time: 09/14/21 12:10 AM  Result Value Ref Range   Glucose-Capillary 116 (H) 70 - 99 mg/dL    Comment: Glucose reference range applies only to samples taken after fasting for at least 8 hours.   Comment 1 Notify RN    Comment 2 Document in Chart   Glucose, capillary     Status: Abnormal   Collection Time: 09/14/21  4:53 AM  Result Value Ref Range   Glucose-Capillary 110 (H) 70 - 99 mg/dL    Comment: Glucose reference range applies only to samples  taken after fasting for at least 8 hours.   Comment 1 Notify RN    Comment 2 Document in Chart   Glucose, capillary     Status: Abnormal   Collection Time: 09/14/21  8:29 AM  Result Value Ref Range   Glucose-Capillary 103 (H) 70 - 99 mg/dL    Comment: Glucose reference range applies only to samples taken after fasting for at least 8 hours.    Current Facility-Administered Medications  Medication Dose Route Frequency Provider Last Rate Last Admin   acetaminophen (TYLENOL) tablet 650 mg  650 mg Oral Q6H PRN [place] Eric J, DO   650 mg at 09/14/21 1854   Or   acetaminophen (TYLENOL) suppository 650 mg  650 mg Rectal Q6H PRN [place] Eric J, DO   650 mg at 09/04/21 2313   albuterol (PROVENTIL) (2.5 MG/3ML) 0.083% nebulizer solution 2.5 mg  2.5 mg Nebulization Q2H PRN [place] Eric J, DO       feeding supplement (BOOST / RESOURCE BREEZE) liquid 1 Container  1 Container Oral TID BM [place] Donnamarie Poag, DO   1 Container at 09/15/21 0919   finasteride (PROSCAR) tablet 5 mg  5 mg Oral Daily [place] Donnamarie Poag, DO   5 mg at 09/15/21 0915   fluticasone (FLONASE) 50 MCG/ACT nasal spray 1 spray  1 spray Each Nare BID [place] Eric J, DO   1 spray at 09/15/21 0919   gabapentin (NEURONTIN) capsule 100 mg  100 mg Oral BID Suella Broad, FNP   100 mg at 09/15/21 0914   loratadine (CLARITIN) tablet 10 mg  10 mg Oral Daily [place] Eric J, DO   10 mg at 09/15/21 0914   LORazepam (  ATIVAN) tablet 0.5 mg  0.5 mg Oral Q6H PRN [place] Donnamarie Poag, DO   0.5 mg at 09/08/21 1214   melatonin tablet 3 mg  3 mg Oral QHS [place] Donnamarie Poag, DO   3 mg at 09/14/21 2048   naloxone [facility]) injection 0.4 mg  0.4 mg Intravenous PRN [place] Donnamarie Poag, DO       ondansetron [facility]) tablet 4 mg  4 mg Oral Q6H PRN [place] Eric J, DO       Or   ondansetron [facility]) injection 4 mg  4 mg Intravenous Q6H PRN [place] Donnamarie Poag, DO   4 mg at 09/06/21 1906   polyethylene glycol (MIRALAX / GLYCOLAX) packet 17 g  17 g Oral Daily PRN [place]  Eric J, DO       polyvinyl alcohol (LIQUIFILM TEARS) 1.4 % ophthalmic solution 1 drop  1 drop Both Eyes PRN [place] Donnamarie Poag, DO   1 drop at 09/09/21 2778   sertraline (ZOLOFT) tablet 100 mg  100 mg Oral Daily Suella Broad, FNP   100 mg at 09/15/21 2423   tamsulosin (FLOMAX) capsule 0.8 mg  0.8 mg Oral Daily [place] Eric J, DO   0.8 mg at 09/15/21 5361   traZODone (DESYREL) tablet 50 mg  50 mg Oral QHS [place] Eric J, DO   50 mg at 09/14/21 2049    Musculoskeletal: Strength & Muscle Tone: within normal limits Gait & Station: unsteady Patient leans: N/A    Psychiatric Specialty Exam:  Presentation  General Appearance: Appropriate for Environment; Casual  Eye Contact:Fair  Speech:Clear and Coherent; Normal Rate  Speech Volume:Normal  Handedness:Right   Mood and Affect  Mood:Anxious; Depressed  Affect:Congruent; Appropriate   Thought Process  Thought Processes:Coherent; Linear  Descriptions of Associations:Intact  Orientation:Full (Time, Place and Person)  Thought Content:Logical  History of Schizophrenia/Schizoaffective disorder:No data recorded Duration of Psychotic Symptoms:No data recorded Hallucinations:Hallucinations: None  Ideas of Reference:None  Suicidal Thoughts:Suicidal Thoughts: No  Homicidal Thoughts:Homicidal Thoughts: No   Sensorium  Memory:Immediate Fair; Recent Fair; Remote Fair  Judgment:Fair  Insight:Shallow   Executive Functions  Concentration:Fair  Attention Span:Fair  [place]   Psychomotor Activity  Psychomotor Activity:Psychomotor Activity: Normal   Assets  Assets:Communication Skills; Desire for Improvement; Financial Resources/Insurance; Housing; Leisure Time; Physical Health; Resilience; Social Support   Sleep  Sleep:Sleep: Fair   Physical Exam: Physical Exam Vitals and nursing note reviewed.  Constitutional:      Appearance: Normal appearance. He is  normal weight.  Skin:    General: Skin is warm.     Capillary Refill: Capillary refill takes less than 2 seconds.  Neurological:     General: No focal deficit present.     Mental Status: He is alert and oriented to person, place, and time. Mental status is at baseline.  Psychiatric:        Mood and Affect: Mood normal.        Behavior: Behavior normal.        Thought Content: Thought content normal.        Judgment: Judgment normal.    Review of Systems  Psychiatric/Behavioral:  Negative for hallucinations, memory loss and substance abuse. Depression: denies. Suicidal ideas: denies currently.The patient is nervous/anxious. The patient does not have insomnia.   All other systems reviewed and are negative.  Blood pressure (!) 125/53, pulse 73, temperature 98 F (36.7 C), temperature source Oral, resp. rate 20, height 5' (1.524 m), weight 59.2 kg, SpO2  98 %. Body mass index is 25.49 kg/m.  Treatment Plan Summary: 88 year old male with multiple medical problems with worsening depressive symptoms. He was admitted to the hospital after he intentionally attempted suicide by overdosing on medication. Today, patient is unable to contract for safety, he will benefit from psychiatric inpatient admission after he is medically stabilized.  Plan/Recommendations: -Continue 1:1 sitter for safety -Continue Zoloft 100 mg p.o. daily for depression and suicidal thoughts. -Will continue gabapentin 100 mg p.o. twice daily for anxiety, mood, and GABA related effects to include pain management. -TOC consult for inpatient geriatric psych hospitalization referral.  Patient is now medically stable.  Foley catheter has also been removed.  Patient is able to ambulate with walker, and continues to progress with physical therapy and other acute care services. Continue IVC, as patient remains high risk for suicide completion.   Disposition: Recommend psychiatric Inpatient admission when medically  cleared. Supportive therapy provided about ongoing stressors. Psychiatric service will follow the patient in order to monitor his progress  Suella Broad, FNP 09/15/2021 2:46 PM

## 2021-09-15 NOTE — TOC Progression Note (Addendum)
Transition of Care [facility]) - Progression Note    Patient Details  Name: [name] MRN: [id number] Date of Birth: March 06, 1933  [facility]) CM/SW Contact  Leeroy Cha, RN Phone Number: 09/15/2021, 9:14 AM  Clinical Narrative:    New progress note faxed out to the geropsychiatry facilities list on the  destination page. No bed offers have been made this am.       Expected Discharge Plan and Services                                                 Social Determinants of Health (SDOH) Interventions    Readmission Risk Interventions   No data to display

## 2021-09-15 NOTE — Progress Notes (Signed)
PROGRESS NOTE    [name]  HER:[id number] DOB: 08-Feb-1933 DOA: 09/04/2021 PCP: Pleas Koch, NP   Brief Narrative:  88 y.o. male with past medical history significant for HTN, HLD, CAD, choledocholithiasis, laparoscopic cholecystectomy, BPH, overactive bladder, OSA not on CPAP, cognitive impairment, anxiety/depression, Leukemia presented on 09/04/2021 with increased lethargy and confusion, hallucinations with possible suicidal ideation.  Patient's daughter found empty Dilaudid container at home, which was apparently his wife's previous medication (wife passed away 2 years ago from pancreatic cancer).  There was a concern for intentional overdose.  On presentation, CT of the head without contrast was without acute intracranial abnormity.  Chest x-ray showed no acute cardiopulmonary disease process.  Patient was IVC'd.  Psychiatry was consulted.  Psychiatry recommended transfer to [facility] facility.  Currently medically stable for discharge.  Assessment & Plan:   Acute metabolic/toxic encephalopathy secondary to possible intentional opiate overdose Suicide attempt, continued ideations Anxiety/depression -Psychiatry following.  Currently IVCD'd.  Psychiatry recommending transfer to [place] facility.  Social worker following. -Continue suicide precautions, one-to-one sitter. -Currently on trazodone, sertraline, gabapentin as per psychiatry -Continue as needed Ativan for agitation -Currently medically stable for discharge  Aspiration pneumonia -Status post 5 days of antibiotics, antibiotics discontinued on 09/09/2021 -Currently on room air.  Diet as per SLP recommendations.  Leukocytosis -Blood cultures negative so far.  Urine culture grew Staph epidermidis: Likely contaminant -No labs recently.  Acute urinary retention Hematuria BPH -Hematuria likely secondary to traumatic Foley placement in the ED.  Attempted to discontinue Foley catheter with continued urinary retention  and hematuria.  Urology consulted and patient underwent coud catheter placement by Dr. Alyson Ingles on 09/05/2021. -Continue Flomax and finasteride. -On 09/06/2021: Urology had recommended that Foley should remain in place for 5 to 7 days prior to attempting a voiding trial.  After communicating with Dr. Alyson Ingles via secure chat on 09/11/2021, Foley catheter was removed on 09/11/2021 and patient is voiding well subsequently.  Outpatient follow-up with urology.  Thrombocytopenia -Questionable cause.  No recent labs.  Left otitis externa -Continue Ciprodex eardrops  CAD/hypertension/hyperlipidemia -Currently not on any medication at this time.  Outpatient follow-up with PCP   DVT prophylaxis: SCDs Code Status: DNR Family Communication: Daughter/Sharon at bedside on 09/14/2021 Disposition Plan: Status is: Inpatient Remains inpatient appropriate because: Of need for [facility] psych placement.  Currently medically stable for discharge    Consultants: Psychiatry/urology  Procedures: None  Antimicrobials:  Anti-infectives (From admission, onward)    Start     Dose/Rate Route Frequency Ordered Stop   09/05/21 1115  cefTRIAXone (ROCEPHIN) 2 g in sodium chloride 0.9 % 100 mL IVPB        2 g 200 mL/hr over 30 Minutes Intravenous Every 24 hours 09/05/21 1019 09/09/21 1203   09/05/21 1115  metroNIDAZOLE (FLAGYL) IVPB 500 mg  Status:  Discontinued        500 mg 100 mL/hr over 60 Minutes Intravenous Every 12 hours 09/05/21 1019 09/09/21 1418        Subjective: Patient seen and examined at bedside.  No fever, vomiting, worsening shortness of breath or agitation reported.   Objective: Vitals:   09/14/21 0619 09/14/21 1323 09/14/21 2154 09/15/21 0616  BP: (!) 125/53 (!) 120/54 (!) 103/51 (!) 125/53  Pulse: 70 81 85 73  Resp: _0 Temp: 97.7 F (36.5 C) 98.1 F (36.7 C) 98.9 F (37.2 C) 98 F (36.7 C)  TempSrc: Oral Oral Oral Oral  SpO2: 96% 98% 94% 98%  Weight:      Height:         Intake/Output Summary (Last 24 hours) at 09/15/2021 0811 Last data filed at 09/15/2021 0439 Gross per 24 hour  Intake 1380 ml  Output 1200 ml  Net 180 ml    Filed Weights   09/07/21 1151 09/10/21 1042  Weight: 59.2 kg 59.2 kg    Examination:  General: still on room air.  No distress.   respiratory: Decreased breath sounds at bases bilaterally, some crackles CVS: Rate controlled; S1 and S2 are heard abdominal: Soft, nontender, distended slightly; no organomegaly; normal bowel sounds are heard extremities: Trace lower extremity edema present; no clubbing  Data Reviewed: I have personally reviewed following labs and imaging studies  CBC: Recent Labs  Lab 09/09/21 0529  WBC 15.5*  HGB 12.0*  HCT 39.2  MCV 87.7  PLT 54*    Basic Metabolic Panel: Recent Labs  Lab 09/09/21 0529  NA 143  K 3.5  CL 116*  CO2 23  GLUCOSE 105*  BUN 8  CREATININE 0.69  CALCIUM 8.2*  MG 1.9    GFR: Estimated Creatinine Clearance: 47.7 mL/min (by C-G formula based on SCr of 0.69 mg/dL). Liver Function Tests: No results for input(s): "AST", "ALT", "ALKPHOS", "BILITOT", "PROT", "ALBUMIN" in the last 168 hours.  No results for input(s): "LIPASE", "AMYLASE" in the last 168 hours. No results for input(s): "AMMONIA" in the last 168 hours. Coagulation Profile: No results for input(s): "INR", "PROTIME" in the last 168 hours. Cardiac Enzymes: No results for input(s): "CKTOTAL", "CKMB", "CKMBINDEX", "TROPONINI" in the last 168 hours. BNP (last 3 results) No results for input(s): "PROBNP" in the last 8760 hours. HbA1C: No results for input(s): "HGBA1C" in the last 72 hours. CBG: Recent Labs  Lab 09/13/21 1856 09/13/21 2027 09/14/21 0010 09/14/21 0453 09/14/21 0829  GLUCAP 165* 136* 116* 110* 103*    Lipid Profile: No results for input(s): "CHOL", "HDL", "LDLCALC", "TRIG", "CHOLHDL", "LDLDIRECT" in the last 72 hours. Thyroid Function Tests: No results for input(s): "TSH",  "T4TOTAL", "FREET4", "T3FREE", "THYROIDAB" in the last 72 hours. Anemia Panel: No results for input(s): "VITAMINB12", "FOLATE", "FERRITIN", "TIBC", "IRON", "RETICCTPCT" in the last 72 hours. Sepsis Labs: No results for input(s): "PROCALCITON", "LATICACIDVEN" in the last 168 hours.   No results found for this or any previous visit (from the past 240 hour(s)).        Radiology Studies: DG Swallowing Func-Speech Pathology  Result Date: 09/14/2021 Table formatting from the original result was not included. Objective Swallowing Evaluation: Type of Study: MBS-Modified Barium Swallow Study  Patient Details Name: DEQUINCY BORN MRN: [id number] Date of Birth: 09-Mar-1933 Today's Date: 09/14/2021 Time: SLP Start Time (ACUTE ONLY): 0840 -SLP Stop Time (ACUTE ONLY): 0855 SLP Time Calculation (min) (ACUTE ONLY): 15 min Past Medical History: Past Medical History: Diagnosis Date  Arthralgia of multiple joints   limited mobility w/  independant adl's  BPH with obstruction/lower urinary tract symptoms   Chronic idiopathic monocytosis   followed by dr Waymon Budge--  per lov note 06/ 2017 persistant unexplained  with normal bone barrow bx  Coronary atherosclerosis of unspecified type of vessel, native or graft   cardiologist-  dr Stanford Breed -- per lov note 11-19-2014 ,  cardiac cath in 2000-- pLAD 60-70%,  small intermediate branch 70-80%,  mRCA 20%,  normal LV  Degenerative arthritis of spine   cervical and lumar  Diverticulosis of colon (without mention of hemorrhage)   Dysuria   chronic  Elevated PSA  Feeling of incomplete bladder emptying   Hiatal hernia   moderate per ct 11/ 2017  History of adenomatous polyp of colon   2008  History of atrial fibrillation   remote hx episode  History of COVID-19 03/05/2020  History of pancreatitis   07-01-2013  and 10-10-2015  History of prostatitis   2014  Hx of colonic polyps   Hyperlipidemia   Hypertension   Mouth sore   roof of mouth sore   Nephrolithiasis   Nocturia more than  twice per night   severe  w/ leakage  OSA (obstructive sleep apnea)   INTOLERANT CPAP  Osteoarthritis   Pernicious anemia   B12  Def.  Peyronie disease   Renal insufficiency   Sepsis ([place]) 03/22/2017  Thrombocytopenia, unspecified ([facility]) hemotology/oncologist-  dr Waymon Budge  per dr Velta Addison note 06/ 2016  secondary to vitro clumping  Vitamin B deficiency  Past Surgical History: Past Surgical History: Procedure Laterality Date  BONE MARROW BIOPSY  2011  normal  CARDIAC CATHETERIZATION  2000  per dr Kathyrn Drown note --  dLAD 60-70%,  small intermediate branch 70-80%,  mRCA 20%,  normal LV  CARDIOVASCULAR STRESS TEST  01-29-2013   dr Stanford Breed  normal nuclear study w/ no ischemia,  normal LV function and wall motion , ef 82%  CATARACT EXTRACTION W/ INTRAOCULAR LENS  IMPLANT, BILATERAL  08/2015  CHOLECYSTECTOMY  11/24/2010  Procedure: LAPAROSCOPIC CHOLECYSTECTOMY WITH INTRAOPERATIVE CHOLANGIOGRAM;  Surgeon: Earnstine Regal, MD;  Location: [facility];  Service: General;  Laterality: N/A;  c-arm  CYSTOSCOPY W/ URETERAL STENT PLACEMENT Left 12/17/2015  Procedure: CYSTOSCOPY WITH RETROGRADE PYELOGRAM/URETERAL STENT PLACEMENT;  Surgeon: Ardis Hughs, MD;  Location: [facility];  Service: Urology;  Laterality: Left;  CYSTOSCOPY WITH RETROGRADE PYELOGRAM, URETEROSCOPY AND STENT PLACEMENT Left 12/22/2015  Procedure: CYSTOSCOPY WITH LEFT RETROGRADE  URETEROSCOPY AND STENT PLACEMENT;  Surgeon: Carolan Clines, MD;  Location: [facility];  Service: Urology;  Laterality: Left;  ERCP N/A 07/04/2013  Procedure: ENDOSCOPIC RETROGRADE CHOLANGIOPANCREATOGRAPHY (ERCP);  Surgeon: Gatha Mayer, MD;  Location: [facility];  Service: Endoscopy;  Laterality: N/A;  MAC if available  ERCP N/A 10/12/2015  Procedure: ENDOSCOPIC RETROGRADE CHOLANGIOPANCREATOGRAPHY (ERCP);  Surgeon: [name], MD;  Location: [facility];  Service: Endoscopy;  Laterality: N/A;  HOLMIUM LASER APPLICATION Left 84/01/3242  Procedure: HOLMIUM LASER  APPLICATION;  Surgeon: Carolan Clines, MD;  Location: [facility];  Service: Urology;  Laterality: Left;  INGUINAL HERNIA REPAIR Right 01/25/2002  KNEE ARTHROSCOPY  x5  SATURATION BIOPSY OF PROSTATE  05-29-2007  and 01-26-2008  SHOULDER ARTHROSCOPY WITH OPEN ROTATOR CUFF REPAIR AND DISTAL CLAVICLE ACROMINECTOMY Right 11/19/2004  TOTAL HIP ARTHROPLASTY Right 05/21/2009  TOTAL KNEE ARTHROPLASTY Bilateral left 05-02-2003/  right  03-23-2010  TRANSTHORACIC ECHOCARDIOGRAM  12/23/2004  ef 60%, mild MV calcification without stenosis/  mild TR,  PASP 79mHg  UVULOPALATOPHARYNGOPLASTY  1993   w/  T & A HPI: Patient is an 88y.o. male with PMH: HTN, HLD, CAD, OSA not on CPAP, cognitive impairment, anxiety/depression, h/o Patient has h/o uvulectomy and tonsilectomy, [facility] after patient was found by his daughter with increased confusion and lethargy. Patient with recent hospitalizations for similar events with previous reports of hallucinations as well. Daughters also reported that patient had increased SI and his daughter found an empty container of Dilaudid at home which was his wife's medication (she passed away two years ago)   and there is concern  of an intentional overdose. In ED, patient was afebrile, satting 92% on RA. CT head negative for acute intracranial process, CXR not showing acute cardiopulmonary disease process. Patient was admitted for work-up of acute metabolic encephalopathy. Patient was coughing a lot with medications in morning of 09/11/21 and as patient and daughter report h/o swallowing difficulties, SLP was ordered to evaluate swallow function.  Subjective: pleasant, said he just woke up  Recommendations for follow up therapy are one component of a multi-disciplinary discharge planning process, led by the attending physician.  Recommendations may be updated based on patient status, additional functional criteria and insurance authorization. Assessment /  Plan / Recommendation   09/14/2021  10:21 AM Clinical Impressions Clinical Impression Patient presents with a mild-moderate pharyngeal phase dysphagia as per this MBS. His dysphagia is likely chronic as per patient and daughter's reports during initial evaluation of patient coughing while eating for quite some time. Patient exhibited decreased anterior laryngeal movement and decreased tongue base retraction leading to swallow initiation delays to level of vallecular sinus with puree, regular solids, 13 mm barium tablet and nectar thick liquids and delay at level of pyriform sinus with thin liquids. Delay in epiglottic closure of airway resulted in penetration of thin and nectar thick liquids above vocal cords and trace amount of aspiration during and after the swallow with thin liquids. Aspiration was silent while occuring but patient did exhibit a delayed cough response which was ineffective to clear aspirate. Aspiration frequency and amount increased during the course of the study. A chin tuck posture prevented aspiration with thin liquids and although it did not prevent penetration, it did result in decreased depth of penetrate. No aspiration with nectar thick liquids. 13 mm barium tablet became briefly lodged at level of vallecular sinus but cleared fully with sip of nectar thick liquids; esphageal sweep showed what appeared to be normal esopahgeal transit of tablet. SLP is recommending to downgrade patient's liquids to nectar thick at this time to maximize safety and to allow for thin liquids in between meals with close supervision for use of chin tuck posture. If patient is able to consistently perform chin tuck posture, he would be safe for thin liquids with meals as well. SLP will continue to follow patient for diet toleration.     09/14/2021  10:17 AM Treatment Recommendations Treatment Recommendations Therapy as outlined in treatment plan below     09/14/2021  10:27 AM Prognosis Prognosis for Safe Diet  Advancement Good Barriers to Reach Goals Time post onset   09/14/2021  10:17 AM Diet Recommendations SLP Diet Recommendations Regular solids;Nectar thick liquid;Other (Comment) Liquid Administration via Cup;No straw Medication Administration Whole meds with puree Compensations Slow rate;Small sips/bites;Minimize environmental distractions;Chin tuck Postural Changes Seated upright at 90 degrees     09/14/2021  10:17 AM Other Recommendations Oral Care Recommendations Oral care BID Follow Up Recommendations Other (comment) Assistance recommended at discharge Frequent or constant Supervision/Assistance Functional Status Assessment Patient has had a recent decline in their functional status and demonstrates the ability to make significant improvements in function in a reasonable and predictable amount of time.   09/14/2021  10:17 AM Frequency and Duration  Speech Therapy Frequency (ACUTE ONLY) min 2x/week Treatment Duration 1 week     09/14/2021  10:13 AM Oral Phase Oral Phase [facility]    09/14/2021  10:13 AM Pharyngeal Phase Pharyngeal Phase Impaired Pharyngeal- Nectar Cup Delayed swallow initiation-vallecula;Penetration/Aspiration during swallow;Reduced airway/laryngeal closure;Reduced anterior laryngeal mobility;Reduced tongue base retraction;Pharyngeal residue - valleculae;Pharyngeal residue - pyriform Pharyngeal  Material enters airway, remains ABOVE vocal cords and not ejected out;Material enters airway, remains ABOVE vocal cords then ejected out Pharyngeal- Thin Cup Delayed swallow initiation-pyriform sinuses;Reduced airway/laryngeal closure;Reduced tongue base retraction;Reduced anterior laryngeal mobility;Penetration/Aspiration during swallow;Penetration/Apiration after swallow;Trace aspiration Pharyngeal Material enters airway, passes BELOW cords without attempt by patient to eject out (silent aspiration) Pharyngeal- Puree Delayed swallow initiation-vallecula;Pharyngeal residue - valleculae Pharyngeal- Regular Delayed  swallow initiation-vallecula;Pharyngeal residue - valleculae Pharyngeal- Pill Delayed swallow initiation-vallecula    09/14/2021  10:15 AM Cervical Esophageal Phase  Cervical Esophageal Phase Impaired Puree Prominent cricopharyngeal segment Cervical Esophageal Comment prominent cricopharyngeal bar observed but no observed significant impact on transit of any tested bolus constencies (nectar, thin, puree, tablet, regular) Sonia Baller, MA, CCC-SLP Speech Therapy                          Scheduled Meds:  feeding supplement  1 Container Oral TID BM   finasteride  5 mg Oral Daily   fluticasone  1 spray Each Nare BID   gabapentin  100 mg Oral BID   loratadine  10 mg Oral Daily   melatonin  3 mg Oral QHS   sertraline  100 mg Oral Daily   tamsulosin  0.8 mg Oral Daily   traZODone  50 mg Oral QHS   Continuous Infusions:          Aline August, MD [facility] 09/15/2021, 8:11 AM

## 2021-09-15 NOTE — Plan of Care (Signed)
  Problem: Education: Goal: Knowledge of General Education information will improve Description: Including pain rating scale, medication(s)/side effects and non-pharmacologic comfort measures Outcome: Completed/Met

## 2021-09-15 NOTE — TOC Progression Note (Signed)
Transition of Care [facility]) - Progression Note    Patient Details  Name: [name] MRN: [id number] Date of Birth: 08-22-1933  [facility] CM/SW Contact  Leeroy Cha, RN Phone Number: 09/15/2021, 11:23 AM  Clinical Narrative:    Spoke with the daughter [name] for approximally. 30 minutes about how the process of placement is done.  Wants Father placed as close to [facility] as possible but does understand that that may not be a reality.  States that her Father has expressed his regret of stating that he intended to harm himself and that that is not something one does.  Did inform her that patient was served yesterday with continued committment papers  and they are good for another 7 days.  Did state to her that I would try to get him as close to home as possible but this depends on where he can be placed and not my choice. She stated that she understood.  Ended conversation once all her questions had been answered.        Expected Discharge Plan and Services                                                 Social Determinants of Health (SDOH) Interventions    Readmission Risk Interventions   No data to display

## 2021-09-16 DIAGNOSIS — G928 Other toxic encephalopathy: Secondary | ICD-10-CM | POA: Diagnosis not present

## 2021-09-16 NOTE — Progress Notes (Signed)
PROGRESS NOTE  [name]:[id number] DOB: [birth year] DOA: 09/04/2021 PCP: Pleas Koch, NP   LOS: 11 days   Brief Narrative / Interim history: 88 y.o. male with past medical history significant for HTN, HLD, CAD, choledocholithiasis, laparoscopic cholecystectomy, BPH, overactive bladder, OSA not on CPAP, cognitive impairment, anxiety/depression, Leukemia presented on 09/04/2021 with increased lethargy and confusion, hallucinations with possible suicidal ideation.  Patient's daughter found empty Dilaudid container at home, which was apparently his wife's previous medication (wife passed away 2 years ago from pancreatic cancer).  There was a concern for intentional overdose.  On presentation, CT of the head without contrast was without acute intracranial abnormity.  Chest x-ray showed no acute cardiopulmonary disease process.  Patient was IVC'd.  Psychiatry was consulted.  Psychiatry recommended transfer to [place] facility.  Currently medically stable for discharge.  Subjective / 24h Interval events: Doing well.  Acknowledges that he made a mistake, does not really think that he needs inpatient psych  Assesement and Plan: Principal Problem:   Toxic metabolic encephalopathy Active Problems:   HLD (hyperlipidemia)   Essential hypertension   Coronary atherosclerosis   BPH (benign prostatic hyperplasia)   Drug overdose, intentional, initial encounter ([place])   Acute metabolic encephalopathy   Major depressive disorder, single episode, severe ([place])   Acute urinary retention   Principal problem Acute metabolic/toxic encephalopathy secondary to possible intentional opiate overdose Suicide attempt, continued ideations Anxiety/depression -Psychiatry consulted and followed patient while hospitalized.  Currently IVCD'd.  Psychiatry recommending transfer to [place] facility.  Social worker following. Continue suicide precautions, one-to-one sitter. Currently on trazodone,  sertraline, gabapentin as per psychiatry. Continue as needed Ativan for agitation. Currently medically stable for discharge   Active problems Aspiration pneumonia-Status post 5 days of antibiotics, antibiotics discontinued on 09/09/2021 Leukocytosis -Blood cultures negative so far.  Urine culture grew Staph epidermidis: Likely contaminant Acute urinary retention Hematuria BPH -Hematuria likely secondary to traumatic Foley placement in the ED. previous hospitalist discussed with urology, underwent a successful voiding trial on 8/25 and currently does not have a Foley.  Outpatient follow-up with urology Thrombocytopenia -Questionable cause.  No recent labs. Left otitis externa -Continue Ciprodex eardrops CAD/hypertension/hyperlipidemia -Currently not on any medication at this time.  Outpatient follow-up with PCP  Scheduled Meds:  feeding supplement  1 Container Oral TID BM   finasteride  5 mg Oral Daily   fluticasone  1 spray Each Nare BID   gabapentin  100 mg Oral BID   loratadine  10 mg Oral Daily   melatonin  3 mg Oral QHS   sertraline  100 mg Oral Daily   tamsulosin  0.8 mg Oral Daily   traZODone  50 mg Oral QHS   Continuous Infusions: PRN Meds:.acetaminophen **OR** acetaminophen, albuterol, LORazepam, naLOXone (NARCAN)  injection, ondansetron **OR** ondansetron (ZOFRAN) IV, polyethylene glycol, polyvinyl alcohol  Diet Orders (From admission, onward)     Start     Ordered   09/14/21 0923  Diet regular Room service appropriate? Yes; Fluid consistency: Nectar Thick  Diet effective now       Comments: Chin tuck with liquid sips May have thin liquids in between meals if consistently performing chin tuck No Straws  Question Answer Comment  Room service appropriate? Yes   Fluid consistency: Nectar Thick      09/14/21 0925            DVT prophylaxis: SCDs Start: 09/04/21 1721   Lab Results  Component Value Date   PLT 54 (L)  09/09/2021      Code Status: DNR  Family  Communication: No family at bedside  Status is: Inpatient  Remains inpatient appropriate because: awaiting placement  Level of care: Med-Surg  Consultants:  Psychiatry   Objective: Vitals:   09/15/21 1709 09/15/21 2200 09/16/21 0626 09/16/21 1300  BP: (!) 123/53 (!) 118/53 128/81 (!) 125/51  Pulse: 83 75 72 85  Resp: '18 18 18 18  '$ Temp: 98.1 F (36.7 C) 98.7 F (37.1 C) 97.6 F (36.4 C) 98 F (36.7 C)  TempSrc: Oral Oral Oral Oral  SpO2: 97% 97% 97% 98%  Weight:      Height:        Intake/Output Summary (Last 24 hours) at 09/16/2021 1347 Last data filed at 09/16/2021 1253 Gross per 24 hour  Intake 360 ml  Output 1570 ml  Net -1210 ml   Wt Readings from Last 3 Encounters:  09/10/21 59.2 kg  09/01/21 59.4 kg  08/21/21 59.4 kg    Examination:  Constitutional: NAD Eyes: no scleral icterus ENMT: Mucous membranes are moist.  Neck: normal, supple Respiratory: clear to auscultation bilaterally, no wheezing, no crackles. Normal respiratory effort. No accessory muscle use.  Cardiovascular: Regular rate and rhythm, no murmurs / rubs / gallops. No LE edema.  Abdomen: non distended, no tenderness. Bowel sounds positive.  Musculoskeletal: no clubbing / cyanosis.  Skin: no rashes Neurologic: non focal   Data Reviewed: I have independently reviewed following labs and imaging studies   CBC No results for input(s): "WBC", "HGB", "HCT", "PLT", "MCV", "MCH", "MCHC", "RDW", "LYMPHSABS", "MONOABS", "EOSABS", "BASOSABS", "BANDABS" in the last 168 hours.  Invalid input(s): "NEUTRABS", "BANDSABD"  No results for input(s): "NA", "K", "CL", "CO2", "GLUCOSE", "BUN", "CREATININE", "CALCIUM", "AST", "ALT", "ALKPHOS", "BILITOT", "ALBUMIN", "MG", "CRP", "DDIMER", "PROCALCITON", "LATICACIDVEN", "INR", "TSH", "CORTISOL", "HGBA1C", "AMMONIA", "BNP" in the last 168 hours.  Invalid input(s): "GFRCGP",  "PHOSPHOROUS"  ------------------------------------------------------------------------------------------------------------------ No results for input(s): "CHOL", "HDL", "LDLCALC", "TRIG", "CHOLHDL", "LDLDIRECT" in the last 72 hours.  Lab Results  Component Value Date   HGBA1C 5.2 07/01/2017   ------------------------------------------------------------------------------------------------------------------ No results for input(s): "TSH", "T4TOTAL", "T3FREE", "THYROIDAB" in the last 72 hours.  Invalid input(s): "FREET3"  Cardiac Enzymes No results for input(s): "CKMB", "TROPONINI", "MYOGLOBIN" in the last 168 hours.  Invalid input(s): "CK" ------------------------------------------------------------------------------------------------------------------    Component Value Date/Time   BNP 146.8 (H) 09/04/2021 1342    CBG: Recent Labs  Lab 09/13/21 1856 09/13/21 2027 09/14/21 0010 09/14/21 0453 09/14/21 0829  GLUCAP 165* 136* 116* 110* 103*    No results found for this or any previous visit (from the past 240 hour(s)).   Radiology Studies: No results found.   Marzetta Board, MD, PhD [facility]  Between 7 am - 7 pm I am available, please contact me via Amion (for emergencies) or Securechat (non urgent messages)  Between 7 pm - 7 am I am not available, please contact night coverage MD/APP via Amion

## 2021-09-16 NOTE — Plan of Care (Signed)
  Problem: Health Behavior/Discharge Planning: Goal: Ability to manage health-related needs will improve Outcome: Progressing   Problem: Clinical Measurements: Goal: Diagnostic test results will improve Outcome: Completed/Met Goal: Respiratory complications will improve Outcome: Completed/Met Goal: Cardiovascular complication will be avoided Outcome: Completed/Met   Problem: Activity: Goal: Risk for activity intolerance will decrease Outcome: Progressing

## 2021-09-16 NOTE — Progress Notes (Signed)
Mobility Specialist - Progress Note     09/16/21 1648  Mobility  Activity Ambulated with assistance in hallway  Level of Assistance Standby assist, set-up cues, supervision of patient - no hands on  Assistive Device Front wheel walker  Distance Ambulated (ft) 500 ft  Activity Response Tolerated well  $Mobility charge 1 Mobility   Pt was found in bed and agreeable to mobilize. Had no complaints during ambulation and was left in bed with all necessities in reach.  Ferd Hibbs Mobility Specialist

## 2021-09-16 NOTE — TOC Progression Note (Signed)
Transition of Care [facility]) - Progression Note    Patient Details  Name: [name] MRN: [id number] Date of Birth: 07-Feb-1933  Transition of Care [facility]) CM/SW Contact  Leeroy Cha, RN Phone Number: 09/16/2021, 10:10 AM  Clinical Narrative:    [facility] Gl[facility]ier regional message left on the v.m. of the admissions for psych to please return call. Faxed updted information to [facility] for [place] [facility] [place].        Expected Discharge Plan and Services                                                 Social Determinants of Health (SDOH) Interventions    Readmission Risk Interventions   No data to display

## 2021-09-17 ENCOUNTER — Other Ambulatory Visit: Payer: Self-pay

## 2021-09-17 ENCOUNTER — Inpatient Hospital Stay
Admission: AD | Admit: 2021-09-17 | Discharge: 2021-09-23 | DRG: 885 | Disposition: A | Discharge status: Home / Self Care | Payer: Medicare HMO | Source: Intra-hospital | Attending: Psychiatry | Admitting: Psychiatry

## 2021-09-17 ENCOUNTER — Encounter: Payer: Self-pay | Admitting: Family

## 2021-09-17 DIAGNOSIS — Z961 Presence of intraocular lens: Secondary | ICD-10-CM | POA: Diagnosis present

## 2021-09-17 DIAGNOSIS — Z79891 Long term (current) use of opiate analgesic: Secondary | ICD-10-CM | POA: Diagnosis not present

## 2021-09-17 DIAGNOSIS — G4733 Obstructive sleep apnea (adult) (pediatric): Secondary | ICD-10-CM | POA: Diagnosis present

## 2021-09-17 DIAGNOSIS — F332 Major depressive disorder, recurrent severe without psychotic features: Principal | ICD-10-CM | POA: Diagnosis present

## 2021-09-17 DIAGNOSIS — G928 Other toxic encephalopathy: Secondary | ICD-10-CM | POA: Diagnosis not present

## 2021-09-17 DIAGNOSIS — Z96641 Presence of right artificial hip joint: Secondary | ICD-10-CM | POA: Diagnosis present

## 2021-09-17 DIAGNOSIS — D72821 Monocytosis (symptomatic): Secondary | ICD-10-CM | POA: Diagnosis not present

## 2021-09-17 DIAGNOSIS — I2510 Atherosclerotic heart disease of native coronary artery without angina pectoris: Secondary | ICD-10-CM | POA: Diagnosis present

## 2021-09-17 DIAGNOSIS — Z66 Do not resuscitate: Secondary | ICD-10-CM | POA: Diagnosis present

## 2021-09-17 DIAGNOSIS — Z9049 Acquired absence of other specified parts of digestive tract: Secondary | ICD-10-CM

## 2021-09-17 DIAGNOSIS — Z87891 Personal history of nicotine dependence: Secondary | ICD-10-CM

## 2021-09-17 DIAGNOSIS — Z9151 Personal history of suicidal behavior: Secondary | ICD-10-CM | POA: Diagnosis not present

## 2021-09-17 DIAGNOSIS — E785 Hyperlipidemia, unspecified: Secondary | ICD-10-CM | POA: Diagnosis present

## 2021-09-17 DIAGNOSIS — Z79899 Other long term (current) drug therapy: Secondary | ICD-10-CM

## 2021-09-17 DIAGNOSIS — F411 Generalized anxiety disorder: Secondary | ICD-10-CM | POA: Diagnosis not present

## 2021-09-17 DIAGNOSIS — Z8616 Personal history of COVID-19: Secondary | ICD-10-CM | POA: Diagnosis not present

## 2021-09-17 DIAGNOSIS — W57XXXA Bitten or stung by nonvenomous insect and other nonvenomous arthropods, initial encounter: Secondary | ICD-10-CM | POA: Diagnosis present

## 2021-09-17 DIAGNOSIS — Z91048 Other nonmedicinal substance allergy status: Secondary | ICD-10-CM

## 2021-09-17 DIAGNOSIS — I10 Essential (primary) hypertension: Secondary | ICD-10-CM | POA: Diagnosis present

## 2021-09-17 DIAGNOSIS — M25519 Pain in unspecified shoulder: Secondary | ICD-10-CM | POA: Diagnosis not present

## 2021-09-17 DIAGNOSIS — K5900 Constipation, unspecified: Secondary | ICD-10-CM | POA: Diagnosis not present

## 2021-09-17 DIAGNOSIS — N401 Enlarged prostate with lower urinary tract symptoms: Secondary | ICD-10-CM | POA: Diagnosis present

## 2021-09-17 DIAGNOSIS — N138 Other obstructive and reflux uropathy: Secondary | ICD-10-CM | POA: Diagnosis not present

## 2021-09-17 DIAGNOSIS — Z96653 Presence of artificial knee joint, bilateral: Secondary | ICD-10-CM | POA: Diagnosis present

## 2021-09-17 DIAGNOSIS — Z880 Allergy status to penicillin: Secondary | ICD-10-CM | POA: Diagnosis not present

## 2021-09-17 DIAGNOSIS — M1990 Unspecified osteoarthritis, unspecified site: Secondary | ICD-10-CM | POA: Diagnosis present

## 2021-09-17 LAB — RESP PANEL BY RT-PCR (FLU A&B, COVID) ARPGX2
Influenza A by PCR: NEGATIVE
Influenza B by PCR: NEGATIVE
SARS Coronavirus 2 by RT PCR: NEGATIVE

## 2021-09-17 MED ORDER — LORAZEPAM 0.5 MG PO TABS
0.5000 mg | ORAL_TABLET | Freq: Four times a day (QID) | ORAL | Status: DC | PRN
  Administered 2021-09-20: 0.5 mg via ORAL
  Filled 2021-09-17: qty 1

## 2021-09-17 MED ORDER — ALBUTEROL SULFATE (2.5 MG/3ML) 0.083% IN NEBU: 2.5000 mg | INHALATION_SOLUTION | RESPIRATORY_TRACT | Status: DC | PRN

## 2021-09-17 MED ORDER — TAMSULOSIN HCL 0.4 MG PO CAPS
0.8000 mg | ORAL_CAPSULE | Freq: Every day | ORAL | Status: DC
  Administered 2021-09-18 – 2021-09-23 (×6): 0.8 mg via ORAL
  Filled 2021-09-17 (×6): qty 2

## 2021-09-17 MED ORDER — SERTRALINE HCL 100 MG PO TABS: 100.0000 mg | ORAL_TABLET | Freq: Every day | ORAL | Status: DC

## 2021-09-17 MED ORDER — MELATONIN 5 MG PO TABS
5.0000 mg | ORAL_TABLET | Freq: Every day | ORAL | Status: DC
  Administered 2021-09-17 – 2021-09-22 (×5): 5 mg via ORAL
  Filled 2021-09-17 (×5): qty 1

## 2021-09-17 MED ORDER — GABAPENTIN 100 MG PO CAPS
100.0000 mg | ORAL_CAPSULE | Freq: Two times a day (BID) | ORAL | Status: DC
  Administered 2021-09-17 – 2021-09-22 (×10): 100 mg via ORAL
  Filled 2021-09-17 (×10): qty 1

## 2021-09-17 MED ORDER — POLYVINYL ALCOHOL 1.4 % OP SOLN: 1.0000 [drp] | OPHTHALMIC | Status: DC | PRN

## 2021-09-17 MED ORDER — SERTRALINE HCL 50 MG PO TABS
100.0000 mg | ORAL_TABLET | Freq: Every day | ORAL | Status: DC
  Administered 2021-09-18 – 2021-09-23 (×6): 100 mg via ORAL
  Filled 2021-09-17 (×6): qty 2

## 2021-09-17 MED ORDER — TRAZODONE HCL 50 MG PO TABS
50.0000 mg | ORAL_TABLET | Freq: Every day | ORAL | Status: DC
  Administered 2021-09-17: 50 mg via ORAL
  Filled 2021-09-17 (×2): qty 1

## 2021-09-17 MED ORDER — LORATADINE 10 MG PO TABS
10.0000 mg | ORAL_TABLET | Freq: Every day | ORAL | Status: DC
  Administered 2021-09-18 – 2021-09-23 (×6): 10 mg via ORAL
  Filled 2021-09-17 (×6): qty 1

## 2021-09-17 MED ORDER — POLYETHYLENE GLYCOL 3350 17 G PO PACK: 17.0000 g | PACK | Freq: Every day | ORAL | Status: DC | PRN

## 2021-09-17 MED ORDER — MAGNESIUM HYDROXIDE 400 MG/5ML PO SUSP: 30.0000 mL | Freq: Every day | ORAL | Status: DC | PRN

## 2021-09-17 MED ORDER — TRAZODONE HCL 50 MG PO TABS: 50.0000 mg | ORAL_TABLET | Freq: Every day | ORAL | Status: DC

## 2021-09-17 MED ORDER — ALUM & MAG HYDROXIDE-SIMETH 200-200-20 MG/5ML PO SUSP: 30.0000 mL | ORAL | Status: DC | PRN

## 2021-09-17 MED ORDER — FLUTICASONE PROPIONATE 50 MCG/ACT NA SUSP
1.0000 | Freq: Two times a day (BID) | NASAL | Status: DC
  Administered 2021-09-17 – 2021-09-23 (×12): 1 via NASAL
  Filled 2021-09-17 (×2): qty 16

## 2021-09-17 MED ORDER — ACETAMINOPHEN 325 MG PO TABS: 650.0000 mg | ORAL_TABLET | Freq: Four times a day (QID) | ORAL | Status: DC | PRN

## 2021-09-17 MED ORDER — FINASTERIDE 5 MG PO TABS
5.0000 mg | ORAL_TABLET | Freq: Every day | ORAL | Status: DC
  Administered 2021-09-18 – 2021-09-23 (×6): 5 mg via ORAL
  Filled 2021-09-17 (×6): qty 1

## 2021-09-17 MED ORDER — FINASTERIDE 5 MG PO TABS: 5.0000 mg | ORAL_TABLET | Freq: Every day | ORAL | Status: DC

## 2021-09-17 MED ORDER — GABAPENTIN 100 MG PO CAPS: 100.0000 mg | ORAL_CAPSULE | Freq: Two times a day (BID) | ORAL | Status: DC

## 2021-09-17 NOTE — BH Assessment (Signed)
Patient has been accepted to [facility]  Accepting physician is Dr. Louis Meckel.  Attending  Physician will be Dr. Louis Meckel.  Patient has been assigned to room L36, by [facility]   Call report to [phone].  Representative/Transfer Coordinator is Dispensing optician Patient pre-admitted by [facility] Patient Access Apolonio Schneiders).

## 2021-09-17 NOTE — Progress Notes (Signed)
Patient calm and cooperative with admission assessment. He is alert and oriented x4. He states that he is in the hospital because he took his wife's old hydrocodone pills, but that it was a "foolish mistake". Patient currently denies SI, HI, and AVH. Patient states that he just feels like a burden and wanted to fall asleep peacefully and "go". Patient lives alone and has 2 daughters, 1 of which is his POA. Patient reports good sleep, but poor appetite. Patient states that he has been "forced" to eat while in the hospital, but that the meals are too large. Patient did not want to come out of his room to eat dinner in the dayroom after arriving to the unit.   Skin assessment completed with Heather, MHT. Patient has a small skin tear on the left hand and left forearm. Patient also has a bruise on his right arm. Patient was oriented to the unit and called his daughter. Patient also gave verbal permission for staff to talk to his daughters.

## 2021-09-17 NOTE — TOC Progression Note (Addendum)
Transition of Care [facility]) - Progression Note    Patient Details  Name: [name] MRN: [id number] Date of Birth: [birth year]  Transition of Care [facility]) CM/SW Contact  Leeroy Cha, RN Phone Number: 09/17/2021, 11:17 AM  Clinical Narrative:    Requested information for possible admission into psych unit at [facility] sent via fax. Pt accepted by [place] into room 5619. [place]ct-[place] [employer]. Will transport may be several hours. Daughter [name] is with the patient and is aware.  Expected Discharge Plan: Psychiatric Hospital Barriers to Discharge: Barriers Resolved  Expected Discharge Plan and Services Expected Discharge Plan: [facility]   Discharge Planning Services: CM Consult   Living arrangements for the past 2 months: Single Family Home                                       Social Determinants of Health (SDOH) Interventions    Readmission Risk Interventions   No data to display

## 2021-09-17 NOTE — TOC Transition Note (Signed)
Transition of Care [facility]) - CM/SW Discharge Note   Patient Details  Name: [name] MRN: [id number] Date of Birth: 1933-11-02  Transition of [facility]) CM/SW Contact:  Leeroy Cha, RN Phone Number: 09/17/2021, 1:25 PM   Clinical Narrative:    [facility] [phone] via [facility].  Packet for transport given to the unit clerk.   Final next level of care: Psychiatric Hospital Barriers to Discharge: Barriers Resolved   Patient Goals and CMS Choice Patient states their goals for this hospitalization and ongoing recovery are:: daughters want patient to go to psych facility CMS Medicare.gov Compare Post Acute Care list provided to:: Patient Represenative (must comment) (poa karen) Choice offered to / list presented to : [place] / [place]  Discharge Placement                       Discharge Plan and Services   Discharge Planning Services: CM Consult                                 Social Determinants of Health (SDOH) Interventions     Readmission Risk Interventions   No data to display

## 2021-09-17 NOTE — Discharge Summary (Signed)
Physician Discharge Summary  [name] ION:[id number] DOB: 06/06/1933 DOA: 09/04/2021  PCP: Pleas Koch, NP  Admit date: 09/04/2021 Discharge date: 09/17/2021  Admitted From: home Disposition:  psych unit  Recommendations for Outpatient Follow-up:  Follow up with PCP in 1-2 weeks  Home Health: none Equipment/Devices: none  Discharge Condition: stable CODE STATUS: DNR Diet Orders (From admission, onward)     Start     Ordered   09/14/21 2440  Diet regular Room service appropriate? Yes; Fluid consistency: Nectar Thick  Diet effective now       Comments: Chin tuck with liquid sips May have thin liquids in between meals if consistently performing chin tuck No Straws  Question Answer Comment  Room service appropriate? Yes   Fluid consistency: Nectar Thick      09/14/21 0925            HPI: Per admitting MD, [name] is a 88 y.o. male with medical history significant of HTN, HLD, CAD, history of choledocholithiasis, history of laparoscopic cholecystectomy, BPH, overactive bladder, OSA not on CPAP, cognitive impairment, anxiety/depression who presented to [facility] ED on 8/18 via EMS after patient was found with increased lethargy and confusion today by daughter.  Patient unable to answer questions and history obtained from both daughters present at bedside.  Patient currently lives alone but daughters place his daily medications out for him so there is no confusion.  Patient with recent hospitalizations for similar events recently, also reported previous hallucinations thought to be due to previous Remeron and trazodone use which has now been discontinued for roughly 1 week.  Daughter was also reported that patient did have access to a cough medicine that he may have taken.  They are unaware of any other sedating or narcotics available for him at home.  Only other new medication for patient is Ciprodex eardrops after having trauma to his left ear.  Hospital Course /  Discharge diagnoses: Principal Problem:   Toxic metabolic encephalopathy Active Problems:   HLD (hyperlipidemia)   Essential hypertension   Coronary atherosclerosis   BPH (benign prostatic hyperplasia)   Drug overdose, intentional, initial encounter ([place])   Acute metabolic encephalopathy   Major depressive disorder, single episode, severe ([place])   Acute urinary retention    Principal problem Acute metabolic/toxic encephalopathy secondary to possible intentional opiate overdose Suicide attempt, continued ideations Anxiety/depression -Psychiatry consulted and followed patient while hospitalized.  Currently IVCD'd.  Psychiatry recommending transfer to [place] facility.  Social worker following. Continue suicide precautions, one-to-one sitter. Currently on trazodone, sertraline, gabapentin as per psychiatry. Currently medically stable for discharge   Active problems Aspiration pneumonia-Status post 5 days of antibiotics, antibiotics discontinued on 09/09/2021 Leukocytosis -Blood cultures negative so far.  Urine culture grew Staph epidermidis: Likely contaminant Acute urinary retention Hematuria BPH -Hematuria likely secondary to traumatic Foley placement in the ED. previous hospitalist discussed with urology, underwent a successful voiding trial on 8/25 and currently does not have a Foley.  Outpatient follow-up with urology Thrombocytopenia -Questionable cause.  No recent labs. Left otitis externa -completed Ciprodex eardrops CAD/hypertension/hyperlipidemia -Currently not on any medication at this time.  Outpatient follow-up with PCP    Sepsis ruled out   Discharge Instructions   Allergies as of 09/17/2021       Reactions   Penicillins Hives, Other (See Comments)   Whelps, passed out Tolerates cephalosporins Has patient had a PCN reaction causing immediate rash, facial/tongue/throat swelling, SOB or lightheadedness with hypotension:  yes Has patient  had a PCN reaction causing  severe rash involving mucus membranes or skin necrosis: no Has patient had a PCN reaction that required hospitalization: no Has patient had a PCN reaction occurring within the last 10 years: no If all of the above answers are "NO", then may proceed with Cephalosporin use.   Tape Hives, Other (See Comments)   Paper Tape        Medication List     STOP taking these medications    ciprofloxacin-dexamethasone OTIC suspension Commonly known as: CIPRODEX       TAKE these medications    acetaminophen 500 MG tablet Commonly known as: TYLENOL Take 1,000 mg by mouth in the morning and at bedtime.   diclofenac Sodium 1 % Gel Commonly known as: Voltaren Arthritis Pain Apply 2 g topically 3 (three) times daily as needed. What changed: reasons to take this   Ensure Take 237 mLs by mouth daily. Chocolate   finasteride 5 MG tablet Commonly known as: PROSCAR Take 1 tablet (5 mg total) by mouth daily. Start taking on: September 18, 2021   fluticasone 50 MCG/ACT nasal spray Commonly known as: FLONASE Place 1 spray into both nostrils 2 (two) times daily.   gabapentin 100 MG capsule Commonly known as: NEURONTIN Take 1 capsule (100 mg total) by mouth 2 (two) times daily.   Gemtesa 75 MG Tabs Generic drug: Vibegron Take 1 tablet by mouth daily.   loratadine 10 MG tablet Commonly known as: CLARITIN Take 1 tablet (10 mg total) by mouth daily. For allergies   sertraline 100 MG tablet Commonly known as: ZOLOFT Take 1 tablet (100 mg total) by mouth daily. Start taking on: September 18, 2021 What changed:  medication strength how much to take additional instructions   tamsulosin 0.4 MG Caps capsule Commonly known as: FLOMAX Take 2 capsules (0.8 mg total) by mouth daily. For urine flow What changed: additional instructions   TEARS NATURALE OP Place 1-2 drops into both eyes at bedtime as needed (dry eyes).   traZODone 50 MG tablet Commonly known as: DESYREL Take 1 tablet (50  mg total) by mouth at bedtime.         Procedures/Studies:  DG Swallowing Func-Speech Pathology  Result Date: 09/14/2021 Table formatting from the original result was not included. Objective Swallowing Evaluation: Type of Study: MBS-Modified Barium Swallow Study  Patient Details Name: [name] MRN: [id number] Date of Birth: [birth year] Today's Date: 09/14/2021 Time: SLP Start Time (ACUTE ONLY): 0840 -SLP Stop Time (ACUTE ONLY): 4782 SLP Time Calculation (min) (ACUTE ONLY): 15 min Past Medical History: Past Medical History: Diagnosis Date  Arthralgia of multiple joints   limited mobility w/  independant adl's  BPH with obstruction/lower urinary tract symptoms   Chronic idiopathic monocytosis   followed by dr Waymon Budge--  per lov note 06/ 2017 persistant unexplained  with normal bone barrow bx  Coronary atherosclerosis of unspecified type of vessel, native or graft   cardiologist-  dr Stanford Breed -- per lov note 11-19-2014 ,  cardiac cath in 2000-- pLAD 60-70%,  small intermediate branch 70-80%,  mRCA 20%,  normal LV  Degenerative arthritis of spine   cervical and lumar  Diverticulosis of colon (without mention of hemorrhage)   Dysuria   chronic  Elevated PSA   Feeling of incomplete bladder emptying   Hiatal hernia   moderate per ct 11/ 2017  History of adenomatous polyp of colon   2008  History of atrial fibrillation   remote hx episode  History  of COVID-19 03/05/2020  History of pancreatitis   07-01-2013  and 10-10-2015  History of prostatitis   2014  Hx of colonic polyps   Hyperlipidemia   Hypertension   Mouth sore   roof of mouth sore   Nephrolithiasis   Nocturia more than twice per night   severe  w/ leakage  OSA (obstructive sleep apnea)   INTOLERANT CPAP  Osteoarthritis   Pernicious anemia   B12  Def.  Peyronie disease   Renal insufficiency   Sepsis ([place]) 03/22/2017  Thrombocytopenia, unspecified ([place]) hemotology/oncologist-  dr Waymon Budge  per dr Velta Addison note 06/ 2016  secondary to  vitro clumping  Vitamin B deficiency  Past Surgical History: Past Surgical History: Procedure Laterality Date  BONE MARROW BIOPSY  2011  normal  CARDIAC CATHETERIZATION  2000  per dr Kathyrn Drown note --  dLAD 60-70%,  small intermediate branch 70-80%,  mRCA 20%,  normal LV  CARDIOVASCULAR STRESS TEST  01-29-2013   dr Stanford Breed  normal nuclear study w/ no ischemia,  normal LV function and wall motion , ef 82%  CATARACT EXTRACTION W/ INTRAOCULAR LENS  IMPLANT, BILATERAL  08/2015  CHOLECYSTECTOMY  11/24/2010  Procedure: LAPAROSCOPIC CHOLECYSTECTOMY WITH INTRAOPERATIVE CHOLANGIOGRAM;  Surgeon: Earnstine Regal, MD;  Location: [facility];  Service: General;  Laterality: N/A;  c-arm  CYSTOSCOPY W/ URETERAL STENT PLACEMENT Left 12/17/2015  Procedure: CYSTOSCOPY WITH RETROGRADE PYELOGRAM/URETERAL STENT PLACEMENT;  Surgeon: Ardis Hughs, MD;  Location: [facility];  Service: Urology;  Laterality: Left;  CYSTOSCOPY WITH RETROGRADE PYELOGRAM, URETEROSCOPY AND STENT PLACEMENT Left 12/22/2015  Procedure: CYSTOSCOPY WITH LEFT RETROGRADE  URETEROSCOPY AND STENT PLACEMENT;  Surgeon: Carolan Clines, MD;  Location: [facility];  Service: Urology;  Laterality: Left;  ERCP N/A 07/04/2013  Procedure: ENDOSCOPIC RETROGRADE CHOLANGIOPANCREATOGRAPHY (ERCP);  Surgeon: Gatha Mayer, MD;  Location: [facility];  Service: Endoscopy;  Laterality: N/A;  MAC if available  ERCP N/A 10/12/2015  Procedure: ENDOSCOPIC RETROGRADE CHOLANGIOPANCREATOGRAPHY (ERCP);  Surgeon: [name], MD;  Location: [facility];  Service: Endoscopy;  Laterality: N/A;  HOLMIUM LASER APPLICATION Left 96/02/2295  Procedure: HOLMIUM LASER APPLICATION;  Surgeon: Carolan Clines, MD;  Location: [facility];  Service: Urology;  Laterality: Left;  INGUINAL HERNIA REPAIR Right 01/25/2002  KNEE ARTHROSCOPY  x5  SATURATION BIOPSY OF PROSTATE  05-29-2007  and 01-26-2008  SHOULDER ARTHROSCOPY WITH OPEN ROTATOR CUFF REPAIR AND DISTAL CLAVICLE  ACROMINECTOMY Right 11/19/2004  TOTAL HIP ARTHROPLASTY Right 05/21/2009  TOTAL KNEE ARTHROPLASTY Bilateral left 05-02-2003/  right  03-23-2010  TRANSTHORACIC ECHOCARDIOGRAM  12/23/2004  ef 60%, mild MV calcification without stenosis/  mild TR,  PASP 45mHg  UVULOPALATOPHARYNGOPLASTY  1993   w/  T & A HPI: Patient is an 88y.o. male with PMH: HTN, HLD, CAD, OSA not on CPAP, cognitive impairment, anxiety/depression, h/o Patient has h/o uvulectomy and tonsilectomy, [facility] after patient was found by his daughter with increased confusion and lethargy. Patient with recent hospitalizations for similar events with previous reports of hallucinations as well. Daughters also reported that patient had increased SI and his daughter found an empty container of Dilaudid at home which was his wife's medication (she passed away two years ago)   and there is concern of an intentional overdose. In ED, patient was afebrile, satting 92% on RA. CT head negative for acute intracranial process, CXR not showing acute cardiopulmonary disease process. Patient was admitted for work-up of acute metabolic encephalopathy. Patient was  coughing a lot with medications in morning of 09/11/21 and as patient and daughter report h/o swallowing difficulties, SLP was ordered to evaluate swallow function.  Subjective: pleasant, said he just woke up  Recommendations for follow up therapy are one component of a multi-disciplinary discharge planning process, led by the attending physician.  Recommendations may be updated based on patient status, additional functional criteria and insurance authorization. Assessment / Plan / Recommendation   09/14/2021  10:21 AM Clinical Impressions Clinical Impression Patient presents with a mild-moderate pharyngeal phase dysphagia as per this MBS. His dysphagia is likely chronic as per patient and daughter's reports during initial evaluation of patient coughing while eating for quite some time.  Patient exhibited decreased anterior laryngeal movement and decreased tongue base retraction leading to swallow initiation delays to level of vallecular sinus with puree, regular solids, 13 mm barium tablet and nectar thick liquids and delay at level of pyriform sinus with thin liquids. Delay in epiglottic closure of airway resulted in penetration of thin and nectar thick liquids above vocal cords and trace amount of aspiration during and after the swallow with thin liquids. Aspiration was silent while occuring but patient did exhibit a delayed cough response which was ineffective to clear aspirate. Aspiration frequency and amount increased during the course of the study. A chin tuck posture prevented aspiration with thin liquids and although it did not prevent penetration, it did result in decreased depth of penetrate. No aspiration with nectar thick liquids. 13 mm barium tablet became briefly lodged at level of vallecular sinus but cleared fully with sip of nectar thick liquids; esphageal sweep showed what appeared to be normal esopahgeal transit of tablet. SLP is recommending to downgrade patient's liquids to nectar thick at this time to maximize safety and to allow for thin liquids in between meals with close supervision for use of chin tuck posture. If patient is able to consistently perform chin tuck posture, he would be safe for thin liquids with meals as well. SLP will continue to follow patient for diet toleration.     09/14/2021  10:17 AM Treatment Recommendations Treatment Recommendations Therapy as outlined in treatment plan below     09/14/2021  10:27 AM Prognosis Prognosis for Safe Diet Advancement Good Barriers to Reach Goals Time post onset   09/14/2021  10:17 AM Diet Recommendations SLP Diet Recommendations Regular solids;Nectar thick liquid;Other (Comment) Liquid Administration via Cup;No straw Medication Administration Whole meds with puree Compensations Slow rate;Small sips/bites;Minimize  environmental distractions;Chin tuck Postural Changes Seated upright at 90 degrees     09/14/2021  10:17 AM Other Recommendations Oral Care Recommendations Oral care BID Follow Up Recommendations Other (comment) Assistance recommended at discharge Frequent or constant Supervision/Assistance Functional Status Assessment Patient has had a recent decline in their functional status and demonstrates the ability to make significant improvements in function in a reasonable and predictable amount of time.   09/14/2021  10:17 AM Frequency and Duration  Speech Therapy Frequency (ACUTE ONLY) min 2x/week Treatment Duration 1 week     09/14/2021  10:13 AM Oral Phase Oral Phase [facility]    09/14/2021  10:13 AM Pharyngeal Phase Pharyngeal Phase Impaired Pharyngeal- Nectar Cup Delayed swallow initiation-vallecula;Penetration/Aspiration during swallow;Reduced airway/laryngeal closure;Reduced anterior laryngeal mobility;Reduced tongue base retraction;Pharyngeal residue - valleculae;Pharyngeal residue - pyriform Pharyngeal Material enters airway, remains ABOVE vocal cords and not ejected out;Material enters airway, remains ABOVE vocal cords then ejected out Pharyngeal- Thin Cup Delayed swallow initiation-pyriform sinuses;Reduced airway/laryngeal closure;Reduced tongue base retraction;Reduced anterior laryngeal mobility;Penetration/Aspiration during swallow;Penetration/Apiration after  swallow;Trace aspiration Pharyngeal Material enters airway, passes BELOW cords without attempt by patient to eject out (silent aspiration) Pharyngeal- Puree Delayed swallow initiation-vallecula;Pharyngeal residue - valleculae Pharyngeal- Regular Delayed swallow initiation-vallecula;Pharyngeal residue - valleculae Pharyngeal- Pill Delayed swallow initiation-vallecula    09/14/2021  10:15 AM Cervical Esophageal Phase  Cervical Esophageal Phase Impaired Puree Prominent cricopharyngeal segment Cervical Esophageal Comment prominent cricopharyngeal bar observed but no  observed significant impact on transit of any tested bolus constencies (nectar, thin, puree, tablet, regular) Sonia Baller, MA, CCC-SLP Speech Therapy                     MR BRAIN WO CONTRAST  Result Date: 09/04/2021 CLINICAL DATA:  Mental status change EXAM: MRI HEAD WITHOUT CONTRAST TECHNIQUE: Multiplanar, multiecho pulse sequences of the brain and surrounding structures were obtained without intravenous contrast. COMPARISON:  No prior MRI, correlation is made with CT head 09/04/2021 FINDINGS: Brain: No restricted diffusion to suggest acute or subacute infarct. No acute hemorrhage, mass, mass effect, or midline shift. No hemosiderin deposition to suggest remote hemorrhage. Scattered and confluent T2 hyperintense signal in the periventricular white matter and pons, likely the sequela of moderate to severe chronic small vessel ischemic disease. Remote lacunar infarct in the left thalamus. Vascular: Normal arterial flow voids. Skull and upper cervical spine: Normal marrow signal. Sinuses/Orbits: No acute finding. Status post bilateral lens replacements. Other: The mastoids are well aerated. IMPRESSION: No acute intracranial process. No evidence of acute or subacute infarct. No etiology is seen for the patient's altered mental status Electronically Signed   By: Merilyn Baba M.D.   On: 09/04/2021 22:22   CT Head Wo Contrast  Result Date: 09/04/2021 CLINICAL DATA:  Mental status change, unknown cause EXAM: CT HEAD WITHOUT CONTRAST TECHNIQUE: Contiguous axial images were obtained from the base of the skull through the vertex without intravenous contrast. RADIATION DOSE REDUCTION: This exam was performed according to the departmental dose-optimization program which includes automated exposure control, adjustment of the mA and/or kV according to patient size and/or use of iterative reconstruction technique. COMPARISON:  Head CT 07/03/2021 FINDINGS: Brain: No evidence of acute infarction, hemorrhage,  hydrocephalus, extra-axial collection or mass lesion/mass effect. The ventricles are unchanged in size.Confluent periventricular and subcortical white matter hypoattenuation, which is nonspecific but likely sequela of chronic small vessel ischemic disease.Mild cerebral atrophy Vascular: Vascular calcifications.  No hyperdense vessel. Skull: Negative for skull fracture. Sinuses/Orbits: Mild ethmoid air cell mucosal thickening. Orbits are unremarkable. Mastoid air cells are clear. Other: None. IMPRESSION: No acute intracranial abnormality. Chronic small vessel ischemic disease and cerebral atrophy. Electronically Signed   By: Maurine Simmering M.D.   On: 09/04/2021 14:05   DG Chest Port 1 View  Result Date: 09/04/2021 CLINICAL DATA:  Cough, altered mental status EXAM: PORTABLE CHEST 1 VIEW COMPARISON:  07/03/2021 FINDINGS: Transverse diameter of heart is increased. There are no signs of pulmonary edema or focal pulmonary consolidation. There is no pleural effusion or pneumothorax. No significant interval changes are noted in the lung fields. IMPRESSION: No active disease. Electronically Signed   By: Elmer Picker M.D.   On: 09/04/2021 13:46   CT Head Wo Contrast  Result Date: 08/28/2021 CLINICAL DATA:  Trauma, fall EXAM: CT HEAD WITHOUT CONTRAST TECHNIQUE: Contiguous axial images were obtained from the base of the skull through the vertex without intravenous contrast. RADIATION DOSE REDUCTION: This exam was performed according to the departmental dose-optimization program which includes automated exposure control, adjustment of the mA and/or kV according  to patient size and/or use of iterative reconstruction technique. COMPARISON:  07/03/2021 FINDINGS: Brain: No acute intracranial findings are seen in noncontrast CT brain. Cortical sulci are prominent. There is decreased density in periventricular and subcortical white matter. Vascular: Unremarkable. Skull: Unremarkable. Sinuses/Orbits: Unremarkable. Other: No  significant changes are noted. IMPRESSION: No acute intracranial findings are seen in noncontrast CT brain. Atrophy. Small-vessel disease. Electronically Signed   By: Elmer Picker M.D.   On: 08/28/2021 20:22     Subjective: - no chest pain, shortness of breath, no abdominal pain, nausea or vomiting.   Discharge Exam: BP 130/61 (BP Location: Left Arm)   Pulse 77   Temp 97.8 F (36.6 C) (Oral)   Resp 18   Ht 5' (1.524 m) Comment: Per nurse prior assessment  Wt 59.2 kg Comment: Per nurse prior assessment  SpO2 96%   BMI 25.49 kg/m   General: Pt is alert, awake, not in acute distress Cardiovascular: RRR, S1/S2 +, no rubs, no gallops Respiratory: CTA bilaterally, no wheezing, no rhonchi Abdominal: Soft, NT, ND, bowel sounds + Extremities: no edema, no cyanosis    The results of significant diagnostics from this hospitalization (including imaging, microbiology, ancillary and laboratory) are listed below for reference.     Microbiology: No results found for this or any previous visit (from the past 240 hour(s)).   Labs: Basic Metabolic Panel: No results for input(s): "NA", "K", "CL", "CO2", "GLUCOSE", "BUN", "CREATININE", "CALCIUM", "MG", "PHOS" in the last 168 hours. Liver Function Tests: No results for input(s): "AST", "ALT", "ALKPHOS", "BILITOT", "PROT", "ALBUMIN" in the last 168 hours. CBC: No results for input(s): "WBC", "NEUTROABS", "HGB", "HCT", "MCV", "PLT" in the last 168 hours. CBG: Recent Labs  Lab 09/13/21 1856 09/13/21 2027 09/14/21 0010 09/14/21 0453 09/14/21 0829  GLUCAP 165* 136* 116* 110* 103*   Hgb A1c No results for input(s): "HGBA1C" in the last 72 hours. Lipid Profile No results for input(s): "CHOL", "HDL", "LDLCALC", "TRIG", "CHOLHDL", "LDLDIRECT" in the last 72 hours. Thyroid function studies No results for input(s): "TSH", "T4TOTAL", "T3FREE", "THYROIDAB" in the last 72 hours.  Invalid input(s): "FREET3" Urinalysis    Component Value  Date/Time   COLORURINE RED (A) 09/04/2021 1627   APPEARANCEUR CLOUDY (A) 09/04/2021 1627   LABSPEC  09/04/2021 1627    TEST NOT REPORTED DUE TO COLOR INTERFERENCE OF URINE PIGMENT   PHURINE  09/04/2021 1627    TEST NOT REPORTED DUE TO COLOR INTERFERENCE OF URINE PIGMENT   GLUCOSEU (A) 09/04/2021 1627    TEST NOT REPORTED DUE TO COLOR INTERFERENCE OF URINE PIGMENT   GLUCOSEU NEGATIVE 07/15/2021 1636   HGBUR (A) 09/04/2021 1627    TEST NOT REPORTED DUE TO COLOR INTERFERENCE OF URINE PIGMENT   HGBUR negative 06/02/2009 1059   BILIRUBINUR (A) 09/04/2021 1627    TEST NOT REPORTED DUE TO COLOR INTERFERENCE OF URINE PIGMENT   BILIRUBINUR positive 08/13/2021 0846   KETONESUR (A) 09/04/2021 1627    TEST NOT REPORTED DUE TO COLOR INTERFERENCE OF URINE PIGMENT   PROTEINUR (A) 09/04/2021 1627    TEST NOT REPORTED DUE TO COLOR INTERFERENCE OF URINE PIGMENT   UROBILINOGEN 0.2 08/13/2021 0846   UROBILINOGEN 0.2 07/15/2021 1636   NITRITE (A) 09/04/2021 1627    TEST NOT REPORTED DUE TO COLOR INTERFERENCE OF URINE PIGMENT   LEUKOCYTESUR (A) 09/04/2021 1627    TEST NOT REPORTED DUE TO COLOR INTERFERENCE OF URINE PIGMENT    FURTHER DISCHARGE INSTRUCTIONS:   Get Medicines reviewed and adjusted: Please take  all your medications with you for your next visit with your Primary MD   Laboratory/radiological data: Please request your Primary MD to go over all hospital tests and procedure/radiological results at the follow up, please ask your Primary MD to get all Hospital records sent to his/her office.   In some cases, they will be blood work, cultures and biopsy results pending at the time of your discharge. Please request that your primary care M.D. goes through all the records of your hospital data and follows up on these results.   Also Note the following: If you experience worsening of your admission symptoms, develop shortness of breath, life threatening emergency, suicidal or homicidal thoughts  you must seek medical attention immediately by calling 911 or calling your MD immediately  if symptoms less severe.   You must read complete instructions/literature along with all the possible adverse reactions/side effects for all the Medicines you take and that have been prescribed to you. Take any new Medicines after you have completely understood and accpet all the possible adverse reactions/side effects.    Do not drive when taking Pain medications or sleeping medications (Benzodaizepines)   Do not take more than prescribed Pain, Sleep and Anxiety Medications. It is not advisable to combine anxiety,sleep and pain medications without talking with your primary care practitioner   Special Instructions: If you have smoked or chewed Tobacco  in the last 2 yrs please stop smoking, stop any regular Alcohol  and or any Recreational drug use.   Wear Seat belts while driving.   Please note: You were cared for by a hospitalist during your hospital stay. Once you are discharged, your primary care physician will handle any further medical issues. Please note that NO REFILLS for any discharge medications will be authorized once you are discharged, as it is imperative that you return to your primary care physician (or establish a relationship with a primary care physician if you do not have one) for your post hospital discharge needs so that they can reassess your need for medications and monitor your lab values.  Time coordinating discharge: 25 minutes  SIGNED:  Marzetta Board, MD, PhD 09/17/2021, 10:03 AM

## 2021-09-17 NOTE — Group Note (Signed)
[facility] LCSW Group Therapy Note   Group Date: 09/17/2021 Start Time: 1300 End Time: 1305   Type of Therapy/Topic:  Group Therapy:  Balance in Life  Participation Level:  Did Not Attend   Description of Group:    This group will address the concept of balance and how it feels and looks when one is unbalanced. Patients will be encouraged to process areas in their lives that are out of balance, and identify reasons for remaining unbalanced. Facilitators will guide patients utilizing problem- solving interventions to address and correct the stressor making their life unbalanced. Understanding and applying boundaries will be explored and addressed for obtaining  and maintaining a balanced life. Patients will be encouraged to explore ways to assertively make their unbalanced needs known to significant others in their lives, using other group members and facilitator for support and feedback.  Therapeutic Goals: Patient will identify two or more emotions or situations they have that consume much of in their lives. Patient will identify signs/triggers that life has become out of balance:  Patient will identify two ways to set boundaries in order to achieve balance in their lives:  Patient will demonstrate ability to communicate their needs through discussion and/or role plays  Summary of Patient Progress:    Group was not held.  CSW provided the patient with worksheets and offered any additional support.  Patient was encouraged to follow up with CSW if need arises.     Therapeutic Modalities:   Cognitive Behavioral Therapy Solution-Focused Therapy Assertiveness Training   Rozann Lesches, LCSW

## 2021-09-17 NOTE — Progress Notes (Signed)
Report called to [facility] inpatient Psychiatric facility.  Pt transported to facility with [facility] department.  Packet sent with patient. [name]

## 2021-09-17 NOTE — Consult Note (Signed)
Patient is seen and assessed by the psychiatric nurse practitioner.  Patient was observed to be sitting upright in bed, taking morning medications with no assistance.  His staff nurse was at the bedside, no family present.  Patient is advised of his likely admission to [facility] today.  He is further advised that due to him being under involuntary commitment, he will likely be transported by [facility].  In which he replies "okay".  Patient is given the opportunity to answer any questions and or concerns; he does wish that his daughter [name]) is notified of the transfer.  This Probation officer did make an attempt to contact [name]), unsuccessful.  Did leave a message on a recorded voice mail, with an update for her father.  Writer did make an second attempt to contact his daughter [name], was successful.  She is updated and advised of patient's tentative acceptance to [facility], pending negative COVID test.  We did discuss likelihood of father being transported by [facility], and family is encouraged to ride behind him to the facility.  [name] did inquire about possibly being transported with patient in the car, however unable to answer this question specifically and whether or not this can take place.  She does report that she is running late this morning, due to her dog likely being euthanized however she does plan to get to the hospital this morning/afternoon. She did verbalize understanding.  All questions, comments, and concerns were answered.  She is very thankful and appreciative for the services that her father has received during the duration of his stay.  [name]  is a 88 y.o. male who presented as a suicide attempt by overdose on narcotic medication. Patient initially presented with feelings of hopelessness and despair, impaired judgement and lack of insight presented an acutely high safety risk and continues to be  appropriate for inpatient psychiatric admission where he can be monitored for safety and stabilized on medications, as well as receive therapy. He is currently under review at [facility].     Pt was accepted to [facility] 09/17/2021; Bed Assignment pending   Pt meets inpatient criteria per Brandon Fava, NP   Attending Physician will be [name]   DX:MDD   Report can be called [id number] unit:  [phone]   Please fax IVC paperwork to [phone] have both before the patient can arrive at [facility].    Care team communication made with the following: Brandon Griffins, MD; Brandon Berthold, MD; [name], [facility] disposition coordinator;  Brandon Roe, RN; Brandon Fava, NP, [name] daughter.

## 2021-09-18 DIAGNOSIS — F332 Major depressive disorder, recurrent severe without psychotic features: Secondary | ICD-10-CM

## 2021-09-18 MED ORDER — CIPROFLOXACIN-DEXAMETHASONE 0.3-0.1 % OT SUSP
4.0000 [drp] | Freq: Two times a day (BID) | OTIC | Status: DC
  Administered 2021-09-18 – 2021-09-23 (×11): 4 [drp] via OTIC
  Filled 2021-09-18 (×2): qty 7.5

## 2021-09-18 MED ORDER — OLANZAPINE 5 MG PO TABS
5.0000 mg | ORAL_TABLET | Freq: Every day | ORAL | Status: DC
  Administered 2021-09-18 – 2021-09-22 (×5): 5 mg via ORAL
  Filled 2021-09-18 (×5): qty 1

## 2021-09-18 MED ORDER — NEPRO/CARBSTEADY PO LIQD
237.0000 mL | Freq: Three times a day (TID) | ORAL | Status: DC
  Administered 2021-09-18 – 2021-09-23 (×15): 237 mL via ORAL

## 2021-09-18 MED ORDER — ADULT MULTIVITAMIN W/MINERALS CH
1.0000 | ORAL_TABLET | Freq: Every day | ORAL | Status: DC
  Administered 2021-09-18 – 2021-09-23 (×6): 1 via ORAL
  Filled 2021-09-18 (×6): qty 1

## 2021-09-18 MED ORDER — TRAZODONE HCL 50 MG PO TABS
50.0000 mg | ORAL_TABLET | Freq: Every evening | ORAL | Status: DC | PRN
Start: 2021-09-18 — End: 2021-09-23
  Administered 2021-09-18 – 2021-09-20 (×2): 50 mg via ORAL
  Filled 2021-09-18 (×2): qty 1

## 2021-09-18 NOTE — Group Note (Signed)
[facility] LCSW Group Therapy Note   Group Date: 09/18/2021 Start Time: 1400 End Time: 1500   Type of Therapy/Topic:  Group Therapy:  Balance in Life  Participation Level:  None   Description of Group:    This group will address the concept of balance and how it feels and looks when one is unbalanced. Patients will be encouraged to process areas in their lives that are out of balance, and identify reasons for remaining unbalanced. Facilitators will guide patients utilizing problem- solving interventions to address and correct the stressor making their life unbalanced. Understanding and applying boundaries will be explored and addressed for obtaining  and maintaining a balanced life. Patients will be encouraged to explore ways to assertively make their unbalanced needs known to significant others in their lives, using other group members and facilitator for support and feedback.  Therapeutic Goals: Patient will identify two or more emotions or situations they have that consume much of in their lives. Patient will identify signs/triggers that life has become out of balance:  Patient will identify two ways to set boundaries in order to achieve balance in their lives:  Patient will demonstrate ability to communicate their needs through discussion and/or role plays  Summary of Patient Progress: Patient attended group.  Patient appeared attentive and engaged though did not get involved in group discussions.   Therapeutic Modalities:   Cognitive Behavioral Therapy Solution-Focused Therapy Assertiveness Training   Rozann Lesches, LCSW

## 2021-09-18 NOTE — BH IP Treatment Plan (Signed)
Interdisciplinary Treatment and Diagnostic Plan Update  09/18/2021 Time of Session: 9:30AM [name] MRN: [id number]  Principal Diagnosis: MDD (major depressive disorder), recurrent episode, severe ([place])  Secondary Diagnoses: Principal Problem:   MDD (major depressive disorder), recurrent episode, severe ([place])   Current Medications:  Current Facility-Administered Medications  Medication Dose Route Frequency Provider Last Rate Last Admin   acetaminophen (TYLENOL) tablet 650 mg  650 mg Oral Q6H PRN Starkes-Perry, Gayland Curry, FNP       albuterol (PROVENTIL) (2.5 MG/3ML) 0.083% nebulizer solution 2.5 mg  2.5 mg Nebulization Q2H PRN Starkes-Perry, Gayland Curry, FNP       alum & mag hydroxide-simeth (MAALOX/MYLANTA) 200-200-20 MG/5ML suspension 30 mL  30 mL Oral Q4H PRN Starkes-Perry, Gayland Curry, FNP       ciprofloxacin-dexamethasone (CIPRODEX) 0.3-0.1 % OTIC (EAR) suspension 4 drop  4 drop Left EAR BID Parks Ranger, DO       feeding supplement (NEPRO CARB STEADY) liquid 237 mL  237 mL Oral TID BM Parks Ranger, DO   237 mL at 09/18/21 1036   finasteride (PROSCAR) tablet 5 mg  5 mg Oral Daily Suella Broad, FNP   5 mg at 09/18/21 1012   fluticasone (FLONASE) 50 MCG/ACT nasal spray 1 spray  1 spray Each Nare BID Suella Broad, FNP   1 spray at 09/18/21 1013   gabapentin (NEURONTIN) capsule 100 mg  100 mg Oral BID Suella Broad, FNP   100 mg at 09/18/21 1014   loratadine (CLARITIN) tablet 10 mg  10 mg Oral Daily Suella Broad, FNP   10 mg at 09/18/21 1013   LORazepam (ATIVAN) tablet 0.5 mg  0.5 mg Oral Q6H PRN Suella Broad, FNP       magnesium hydroxide (MILK OF MAGNESIA) suspension 30 mL  30 mL Oral Daily PRN Starkes-Perry, Gayland Curry, FNP       melatonin tablet 5 mg  5 mg Oral QHS Suella Broad, FNP   5 mg at 09/17/21 2127   multivitamin with minerals tablet 1 tablet  1 tablet Oral Daily Parks Ranger, DO   1 tablet at  09/18/21 1013   OLANZapine (ZYPREXA) tablet 5 mg  5 mg Oral QHS Herrick, Richard Edward, DO       polyethylene glycol (MIRALAX / GLYCOLAX) packet 17 g  17 g Oral Daily PRN Starkes-Perry, Gayland Curry, FNP       polyvinyl alcohol (LIQUIFILM TEARS) 1.4 % ophthalmic solution 1 drop  1 drop Both Eyes PRN Starkes-Perry, Gayland Curry, FNP       sertraline (ZOLOFT) tablet 100 mg  100 mg Oral Daily Suella Broad, FNP   100 mg at 09/18/21 1012   tamsulosin (FLOMAX) capsule 0.8 mg  0.8 mg Oral Daily Suella Broad, FNP   0.8 mg at 09/18/21 1013   traZODone (DESYREL) tablet 50 mg  50 mg Oral QHS PRN Parks Ranger, DO       PTA Medications: Medications Prior to Admission  Medication Sig Dispense Refill Last Dose   acetaminophen (TYLENOL) 500 MG tablet Take 1,000 mg by mouth in the morning and at bedtime.      Artificial Tear Solution (TEARS NATURALE OP) Place 1-2 drops into both eyes at bedtime as needed (dry eyes).      diclofenac Sodium (VOLTAREN ARTHRITIS PAIN) 1 % GEL Apply 2 g topically 3 (three) times daily as needed. (Patient taking differently: Apply 2 g topically 3 (three) times daily  as needed (left shoulder and arm pain).) 100 g 3    Ensure (ENSURE) Take 237 mLs by mouth daily. Chocolate      finasteride (PROSCAR) 5 MG tablet Take 1 tablet (5 mg total) by mouth daily.      fluticasone (FLONASE) 50 MCG/ACT nasal spray Place 1 spray into both nostrils 2 (two) times daily. 48 g 3    gabapentin (NEURONTIN) 100 MG capsule Take 1 capsule (100 mg total) by mouth 2 (two) times daily.      GEMTESA 75 MG TABS Take 1 tablet by mouth daily.      loratadine (CLARITIN) 10 MG tablet Take 1 tablet (10 mg total) by mouth daily. For allergies 90 tablet 3    sertraline (ZOLOFT) 100 MG tablet Take 1 tablet (100 mg total) by mouth daily.      tamsulosin (FLOMAX) 0.4 MG CAPS capsule Take 2 capsules (0.8 mg total) by mouth daily. For urine flow (Patient taking differently: Take 0.8 mg by mouth daily.)  180 capsule 0    traZODone (DESYREL) 50 MG tablet Take 1 tablet (50 mg total) by mouth at bedtime.       Patient Stressors:    Patient Strengths:    Treatment Modalities: Medication Management, Group therapy, Case management,  1 to 1 session with clinician, Psychoeducation, Recreational therapy.   Physician Treatment Plan for Primary Diagnosis: MDD (major depressive disorder), recurrent episode, severe ([place]) Long Term Goal(s): Improvement in symptoms so as ready for discharge   Short Term Goals: Ability to identify changes in lifestyle to reduce recurrence of condition will improve Ability to verbalize feelings will improve Ability to disclose and discuss suicidal ideas Ability to demonstrate self-control will improve Ability to identify and develop effective coping behaviors will improve Ability to maintain clinical measurements within normal limits will improve Compliance with prescribed medications will improve Ability to identify triggers associated with substance abuse/mental health issues will improve  Medication Management: Evaluate patient's response, side effects, and tolerance of medication regimen.  Therapeutic Interventions: 1 to 1 sessions, Unit Group sessions and Medication administration.  Evaluation of Outcomes: Not Met  Physician Treatment Plan for Secondary Diagnosis: Principal Problem:   MDD (major depressive disorder), recurrent episode, severe ([place])  Long Term Goal(s): Improvement in symptoms so as ready for discharge   Short Term Goals: Ability to identify changes in lifestyle to reduce recurrence of condition will improve Ability to verbalize feelings will improve Ability to disclose and discuss suicidal ideas Ability to demonstrate self-control will improve Ability to identify and develop effective coping behaviors will improve Ability to maintain clinical measurements within normal limits will improve Compliance with prescribed medications will  improve Ability to identify triggers associated with substance abuse/mental health issues will improve     Medication Management: Evaluate patient's response, side effects, and tolerance of medication regimen.  Therapeutic Interventions: 1 to 1 sessions, Unit Group sessions and Medication administration.  Evaluation of Outcomes: Not Met   RN Treatment Plan for Primary Diagnosis: MDD (major depressive disorder), recurrent episode, severe ([place]) Long Term Goal(s): Knowledge of disease and therapeutic regimen to maintain health will improve  Short Term Goals: Ability to demonstrate self-control, Ability to participate in decision making will improve, Ability to verbalize feelings will improve, Ability to disclose and discuss suicidal ideas, Ability to identify and develop effective coping behaviors will improve, and Compliance with prescribed medications will improve  Medication Management: RN will administer medications as ordered by provider, will assess and evaluate patient's response and provide  education to patient for prescribed medication. RN will report any adverse and/or side effects to prescribing provider.  Therapeutic Interventions: 1 on 1 counseling sessions, Psychoeducation, Medication administration, Evaluate responses to treatment, Monitor vital signs and CBGs as ordered, Perform/monitor CIWA, COWS, AIMS and Fall Risk screenings as ordered, Perform wound care treatments as ordered.  Evaluation of Outcomes: Not Met   LCSW Treatment Plan for Primary Diagnosis: MDD (major depressive disorder), recurrent episode, severe ([place]) Long Term Goal(s): Safe transition to appropriate next level of care at discharge, Engage patient in therapeutic group addressing interpersonal concerns.  Short Term Goals: Engage patient in aftercare planning with referrals and resources, Increase social support, Increase ability to appropriately verbalize feelings, Increase emotional regulation, Facilitate  acceptance of mental health diagnosis and concerns, and Increase skills for wellness and recovery  Therapeutic Interventions: Assess for all discharge needs, 1 to 1 time with Social worker, Explore available resources and support systems, Assess for adequacy in community support network, Educate family and significant other(s) on suicide prevention, Complete Psychosocial Assessment, Interpersonal group therapy.  Evaluation of Outcomes: Not Met   Progress in Treatment: Attending groups: No. Participating in groups: No. Taking medication as prescribed: Yes. Toleration medication: Yes. Family/Significant other contact made: No, will contact:  once permission is given. Patient understands diagnosis: Yes. Discussing patient identified problems/goals with staff: Yes. Medical problems stabilized or resolved: Yes. Denies suicidal/homicidal ideation: Yes. Issues/concerns per patient self-inventory: No. Other: none  New problem(s) identified: No, Describe:  none  New Short Term/Long Term Goal(s):  medication management for mood stabilization; elimination of SI thoughts; development of comprehensive mental wellness plan.   Patient Goals:  "just to get out"  Discharge Plan or Barriers:   Reason for Continuation of Hospitalization: Anxiety Depression Medical Issues Medication stabilization Suicidal ideation  Estimated Length of Stay:  1-7 days  Last 3 [place] Suicide Severity Risk Score: [place] Admission (Current) from 09/17/2021 in [place] ED to Hosp-Admission (Discharged) from 09/04/2021 in [place] ED from 08/28/2021 in [place] Error: Q3, 4, or 5 should not be populated when Q2 is No High Risk No Risk       Last PHQ 2/9 Scores:    08/21/2021    1:43 PM 06/25/2021   10:51 AM 03/17/2021    9:24 AM  Depression screen PHQ 2/9  Decreased Interest 0 3  0  Down, Depressed, Hopeless _0 PHQ - 2 Score _1 Altered sleeping _2 Tired, decreased energy _3 Change in appetite 3 3 0  Feeling bad or failure about yourself  _4 Trouble concentrating 0 0 0  Moving slowly or fidgety/restless 0 2 0  Suicidal thoughts 0 3 0  PHQ-9 Score _5 Difficult doing work/chores Extremely dIfficult Extremely dIfficult Somewhat difficult    Scribe for Treatment Team: Rozann Lesches, LCSW 09/18/2021 10:54 AM

## 2021-09-18 NOTE — BHH Counselor (Signed)
Adult Comprehensive Assessment  Patient ID: [name], male   DOB: 1934/01/12, 88 y.o.   MRN: [id number]  Information Source: Information source: Patient  Current Stressors:  Patient states their primary concerns and needs for treatment are:: "I drank Hydrocodone.  It was leftover medication from when my wife passed away 10 years ago." Patient states their goals for this hospitilization and ongoing recovery are:: "just to get out of here" Educational / Learning stressors: Pt denies. Employment / Job issues: Pt denies. Family Relationships: "I felt  like I was a burden to my family" Financial / Lack of resources (include bankruptcy): "I only had $20 to my name. My family was having to buy me groceries" Housing / Lack of housing: Pt denies. Physical health (include injuries & life threatening diseases): "I've torn my rotator cuffs" Social relationships: "I have no friends" Substance abuse: Pt denies. Bereavement / Loss: "lost my wife 10 years ago"  Living/Environment/Situation:  Living Arrangements: Alone Living conditions (as described by patient or guardian): "good as it can be, good neighbors, we get along good" How long has patient lived in current situation?: "20 years" What is atmosphere in current home: Comfortable  Family History:  Marital status: Widowed Widowed, when?: "10 years ago" Does patient have children?: Yes How many children?: 2 How is patient's relationship with their children?: Pt reports that he has twin daughters.  "Hard for me to blend in with the twins because they don't get along, been that way since they were in high school"  Childhood History:  By whom was/is the patient raised?: Both parents Additional childhood history information: "My mother and I moved all around after my dad dies of cancer when I was 35" Description of patient's relationship with caregiver when they were a child: "very good" Patient's description of current relationship with  people who raised him/her: Pt reports that parents are deceased. How were you disciplined when you got in trouble as a child/adolescent?: "limb from a bush and tear me up" Does patient have siblings?: No Did patient suffer any verbal/emotional/physical/sexual abuse as a child?: Yes ("Because of my size I was bullied all through school") Did patient suffer from severe childhood neglect?: No Has patient ever been sexually abused/assaulted/raped as an adolescent or adult?: No Was the patient ever a victim of a crime or a disaster?: No Witnessed domestic violence?: No Has patient been affected by domestic violence as an adult?: No  Education:  Highest grade of school patient has completed: trade school Currently a student?: No Learning disability?: No  Employment/Work Situation:   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 Patient has Held a Job?: "32 years adn 8 months" Where was the Patient Employed at that Time?: "[employer]" Has Patient ever Been in the [employer]?: Yes (Describe in comment) ("6 years in [employer], 6 months active duty, no combat history") Did You Receive Any Psychiatric Treatment/Services While in the [employer]?: No  Financial Resources:   Museum/gallery curator resources: Support from parents / caregiver, Medicare (Family supports) Does patient have a Programmer, applications or guardian?: No  Alcohol/Substance Abuse:   What has been your use of drugs/alcohol within the last 12 months?: Pt denies. 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  I just felt like I  was no longer useful."  Leisure/Recreation:   Do You Have Hobbies?: Yes Leisure and Hobbies: "model cars"  Strengths/Needs:   What is the patient's perception of their strengths?: "I"m honest.  I'm nonviolent.  I try to do the right thing for everybody." Patient states they can use these personal strengths during their treatment to contribute to their recovery: Pt denies. Patient states these barriers may affect/interfere with their treatment: "the only problem I have is getting around" Patient states these barriers may affect their return to the community: Pt denies.  Discharge Plan:   Currently receiving community mental health services: No Patient states concerns and preferences for aftercare planning are: Pt is declining aftercare referrrals at this time. Patient states they will know when they are safe and ready for discharge when: "when I have no anxiety" Does patient have access to transportation?: Yes Does patient have financial barriers related to discharge medications?: No Will patient be returning to same living situation after discharge?: Yes  Summary/Recommendations:   Summary and Recommendations (to be completed by the evaluator): Patient is a 88 year old widowed male from [place], [place] ([place]).  He presents to the hospital after intentional overdose after patient had taken his wife's leftover medication.  Patient was transported to [facility] unit.  Following the overdose, the patient was found in his home by one of his daughters who reports that patient had increased lethargy and confusion.  Initial assessments indicate that patient has had increasing depression.  Both patient and daughter reports that patient was upset when his license was taken away due to cognitive limitations.  Reports also indicate that the patient is not open to a home health aide as the family has suggested.  Patient is declining referral for aftercare  treatment at this time, stating that he does not feel that it is necessary.  Recommendations include: crisis stabilization, therapeutic milieu, encourage group attendance and participation, medication management for mood stabilization and development of comprehensive mental wellness plan.  Rozann Lesches. 09/18/2021

## 2021-09-18 NOTE — Progress Notes (Signed)
D: Pt alert and oriented x 4. Pt denies experiencing any anxiety/depression at this time. Pt denies experiencing any pain at this time. Pt denies experiencing any SI/HI, or AVH at this time.  Pt met w/ his tx team this shift.     A: Scheduled medications administered as prescribed. Patient w/ adeqaute meal and fluid intake. Support and encouragement provided. Routine safety checks conducted q15 minutes.   R: No adverse drug reactions noted. Pt verbally contracts for safety at this time. Pt complaint with medications. Pt interacts minimally with others on the unit. Pt remains safe at this time.

## 2021-09-18 NOTE — [facility] Counselor (Signed)
Patient declined assistance with aftercare referrals at this time.  [name], MSW, LCSW 09/18/2021 10:57 AM

## 2021-09-18 NOTE — [facility] Suicide Risk Assessment (Signed)
[facility] Admission Suicide Risk Assessment   Nursing information obtained from:  Patient Demographic factors:  Male, Living alone, Age 88 or older Current Mental Status:  NA Loss Factors:  NA Historical Factors:  NA Risk Reduction Factors:  Positive social support  Total Time spent with patient: 1 hour Principal Problem: MDD (major depressive disorder), recurrent episode, severe ([place]) Diagnosis:  Principal Problem:   MDD (major depressive disorder), recurrent episode, severe ([place])  Subjective Data: Mr. Glantz is a 88 y.o. male with past medical history significant for HTN, HLD, CAD, history of choledocholithiasis, history of laparoscopic cholecystectomy, BPH, overactive bladder, OSA not on CPAP, cognitive impairment, Anxiety/depression, Leukemia who presented to [facility] ED on 8/18 via EMS after patient was found with increased lethargy and confusion by his daughter.    On evaluation the patient reported: Patient states that he feels better.  States that he is  no longer hallucinating and his mind seems to be returning back to normal. Patient is very circumstantial in his thoughts today as he talks about his past life, wife, suicide attempt and dog.  He tells me he is eating and sleeping without difficulty.  He is also tolerating his medications without adverse reactions and or side effects.  He does appear to be future oriented and seeking inpatient psychiatric services, and now appears remorseful for his suicide attempt.  He is able to verbalize his wrongdoing and understand the impact it has on others.  Although he does continue to state he wishes at times he was successful in his attempt " I am almost 88 years old.  I have my grave site.  I have my tombstone there.  My wife is there.  I just need to be on the other side of her.  I did not mean to hurt anybody, and I wish my daughter would have just showed up a little later. "  At this time patient denies suicidal/self harming thoughts and psychosis.   All  questions, comments, concerns, have been addressed and both verbalized understanding.  Both daughters [name] and [name] are at the bedside, have been updated regarding ongoing search for inpatient geriatric psych hospital.  Daughter [name] does not agree with this decision, and is refusing to allow him to go out of system.  She is aware that her father is currently under involuntary commitment for these reasons listed above.  Continued Clinical Symptoms:  Alcohol Use Disorder Identification Test Final Score (AUDIT): 0 The "Alcohol Use Disorders Identification Test", Guidelines for Use in Primary Care, Second Edition.  World Pharmacologist [facility]). Score between 0-7:  no or low risk or alcohol related problems. Score between 8-15:  moderate risk of alcohol related problems. Score between 16-19:  high risk of alcohol related problems. Score 20 or above:  warrants further diagnostic evaluation for alcohol dependence and treatment.   CLINICAL FACTORS:   Depression:   Hopelessness Impulsivity   Musculoskeletal: Strength & Muscle Tone: within normal limits Gait & Station: normal Patient leans: N/A  Psychiatric Specialty Exam:  Presentation  General Appearance: Appropriate for Environment; Casual  Eye Contact:Fair  Speech:Clear and Coherent; Normal Rate  Speech Volume:Normal  Handedness:Right   Mood and Affect  Mood:Anxious; Depressed  Affect:Congruent; Appropriate   Thought Process  Thought Processes:Coherent; Linear  Descriptions of Associations:Intact  Orientation:Full (Time, Place and Person)  Thought Content:Logical  History of Schizophrenia/Schizoaffective disorder:No data recorded Duration of Psychotic Symptoms:No data recorded Hallucinations:No data recorded Ideas of Reference:None  Suicidal Thoughts:No data recorded Homicidal Thoughts:No data recorded  Sensorium  Memory:Immediate Fair; Recent Fair; Remote  Fair  Judgment:Fair  Insight:Shallow   Executive Functions  Concentration:Fair  Attention Span:Fair  [place]   Psychomotor Activity  Psychomotor Activity:No data recorded  Assets  Assets:Communication Skills; Desire for Improvement; Financial Resources/Insurance; Housing; Leisure Time; Physical Health; Resilience; Social Support   Sleep  Sleep:No data recorded   Blood pressure (!) 105/53, pulse 90, temperature 98 F (36.7 C), temperature source Oral, resp. rate 16, height 5' (1.524 m), weight 59 kg, SpO2 98 %. Body mass index is 25.39 kg/m.   COGNITIVE FEATURES THAT CONTRIBUTE TO RISK:  None    SUICIDE RISK:   Minimal: No identifiable suicidal ideation.  Patients presenting with no risk factors but with morbid ruminations; may be classified as minimal risk based on the severity of the depressive symptoms  PLAN OF CARE: See orders  I certify that inpatient services furnished can reasonably be expected to improve the patient's condition.   [place], DO 09/18/2021, 10:21 AM

## 2021-09-18 NOTE — Progress Notes (Signed)
NUTRITION ASSESSMENT  Pt identified as at risk on the Malnutrition Screen Tool  INTERVENTION:  -MVI with minerals daily -Nepro Shake po TID, each supplement provides 425 kcal and 19 grams protein  -Magic cup TID with meals, each supplement provides 290 kcal and 9 grams of protein   NUTRITION DIAGNOSIS: Inadequate oral intake related to poor appetite as evidenced by pt/ family report.   Goal: Pt to meet >/= 90% of their estimated nutrition needs.  Monitor:  PO intake  Assessment:  Pt admitted due to suicide attempt by overdose on narcotic medications.   88 y.o. male  Pt currently on a regular diet with nectar thick liquids. Per RN notes from inpatient admission, pt was coughing when consuming Boost Breeze supplements. Pt with poor appetite and feels like he is being forced to eat. He has refused to come out of his room for meals. No meal completions data available at this time.  Reviewed wt hx; wt has been stable over the past 2 months.   Medications reviewed.   Labs reviewed: CBGS: 103-136   Height: Ht Readings from Last 1 Encounters:  09/17/21 5' (1.524 m)    Weight: Wt Readings from Last 1 Encounters:  09/17/21 59 kg    Weight Hx: Wt Readings from Last 10 Encounters:  09/17/21 59 kg  09/10/21 59.2 kg  09/01/21 59.4 kg  08/21/21 59.4 kg  08/18/21 59.4 kg  08/12/21 59.6 kg  08/04/21 59.3 kg  07/20/21 59.4 kg  07/17/21 58.7 kg  07/16/21 58.5 kg    BMI:  Body mass index is 25.39 kg/m. BMI WDL  Estimated Nutritional Needs: Kcal: 25-30 kcal/kg Protein: > 1 gram protein/kg Fluid: 1 ml/kcal  Diet Order:  Diet Order             Diet regular Room service appropriate? Yes; Fluid consistency: Nectar Thick  Diet effective now                  Pt is also offered choice of unit snacks mid-morning and mid-afternoon.  Pt is eating as desired.   Lab results and medications reviewed.   Loistine Chance, RD, LDN, [place] Registered Dietitian II Certified  Diabetes Care and Education Specialist Please refer to [facility] for RD and/or RD on-call/weekend/after hours pager

## 2021-09-18 NOTE — H&P (Signed)
Psychiatric Admission Assessment Adult  [name] Identification: [name] MRN:  [id number] Date of Evaluation:  09/18/2021 Chief Complaint:  MDD (major depressive disorder), recurrent episode, severe ([place]) [F33.2] Principal Diagnosis: MDD (major depressive disorder), recurrent episode, severe ([place]) Diagnosis:  Principal Problem:   MDD (major depressive disorder), recurrent episode, severe ([place])  History of Present Illness: [name] is a 88 year old white male who took an overdose of his wife's hydrocodone.  She passed away 10 years ago from pancreatic cancer.  [name] is currently living alone but has a lot of family support including 2 daughters.  [name] tells me that [name] feels like a burden to his family even though they have not said anything of that nature.  [name] just feels that way.  [name] denies being depressed and states that his suicide attempt was in a way to relieve his family of having to take care of him.  [name] does not have any past psychiatric history.  [name] has never been psychiatrically hospitalized.  [name] has never seen a psychiatrist before.  [name] was placed on Zoloft by his PCP approximately 1 week ago.  [name] denies any auditory or visual hallucinations.  Currently denies any suicidal ideation.  PER INITIAL INTAKE: [name] is a 88 y.o. male with past medical history significant for HTN, HLD, CAD, history of choledocholithiasis, history of laparoscopic cholecystectomy, BPH, overactive bladder, OSA not on CPAP, cognitive impairment, Anxiety/depression, Leukemia who presented to [facility] ED on 8/18 via EMS after [name] was found with increased lethargy and confusion by his daughter.    On evaluation the [name] reported: [name] states that [name] feels better.  States that [name] is  no longer hallucinating and his mind seems to be returning back to normal. [name] is very circumstantial in his thoughts today as [name] talks about his past life, wife, suicide attempt and dog.  [name] tells me [name] is eating and sleeping  without difficulty.  [name] is also tolerating his medications without adverse reactions and or side effects.  [name] does appear to be future oriented and seeking inpatient psychiatric services, and now appears remorseful for his suicide attempt.  [name] is able to verbalize his wrongdoing and understand the impact it has on others.  Although [name] does continue to state [name] wishes at times [name] was successful in his attempt " I am almost 88 years old.  I have my grave site.  I have my tombstone there.  My wife is there.  I just need to be on the other side of her.  I did not mean to hurt anybody, and I wish my daughter would have just showed up a little later. "  At this time [name] denies suicidal/self harming thoughts and psychosis.   All questions, comments, concerns, have been addressed and both verbalized understanding.  Both daughters [name] and [name] are at the bedside, have been updated regarding ongoing search for inpatient geriatric psych hospital.  Daughter [name] does not agree with this decision, and is refusing to allow him to go out of system.  She is aware that her father is currently under involuntary commitment for these reasons listed above.  Associated Signs/Symptoms: Depression Symptoms:  depressed mood, anhedonia, hopelessness, suicidal attempt, Duration of Depression Symptoms: No data recorded (Hypo) Manic Symptoms: None Anxiety Symptoms:  Excessive Worry, Psychotic Symptoms:   None PTSD Symptoms: NA Total Time spent with [name]: 1 hour  Past Psychiatric History: None  Is the [name] at risk to self? No.  Has the [name] been  a risk to self in the past 6 months?  Yes Has the [name] been a risk to self within the distant past? No.  Is the [name] a risk to others? No.  Has the [name] been a risk to others in the past 6 months? No.  Has the [name] been a risk to others within the distant past? No.   [place] Scale:  [place] Admission (Current) from 09/17/2021 in [place] ED to Hosp-Admission (Discharged) from 09/04/2021 in [place] ED from 08/28/2021 in [place] Error: Q3, 4, or 5 should not be populated when Q2 is No High Risk No Risk        Prior Inpatient Therapy:   Prior Outpatient Therapy:    Alcohol Screening: 1. How often do you have a drink containing alcohol?: Never 2. How many drinks containing alcohol do you have on a typical day when you are drinking?: 1 or 2 3. How often do you have six or more drinks on one occasion?: Never AUDIT-C Score: 0 4. How often during the last year have you found that you were not able to stop drinking once you had started?: Never 5. How often during the last year have you failed to do what was normally expected from you because of drinking?: Never 6. How often during the last year have you needed a first drink in the morning to get yourself going after a heavy drinking session?: Never 7. How often during the last year have you had a feeling of guilt of remorse after drinking?: Never 8. How often during the last year have you been unable to remember what happened the night before because you had been drinking?: Never 9. Have you or someone else been injured as a result of your drinking?: No 10. Has a relative or friend or a doctor or another health worker been concerned about your drinking or suggested you cut down?: No Alcohol Use Disorder Identification Test Final Score (AUDIT): 0 Substance Abuse History in the last 12 months:  No. Consequences of Substance Abuse: NA Previous Psychotropic Medications: No  Psychological Evaluations: No  Past Medical History:  Past Medical History:  Diagnosis Date   Arthralgia of multiple joints    limited mobility w/  independant adl's   BPH with obstruction/lower urinary tract symptoms    Chronic idiopathic monocytosis    followed by dr Waymon Budge--   per lov note 06/ 2017 persistant unexplained  with normal bone barrow bx   Coronary atherosclerosis of unspecified type of vessel, native or graft    cardiologist-  dr Stanford Breed -- per lov note 11-19-2014 ,  cardiac cath in 2000-- pLAD 60-70%,  small intermediate branch 70-80%,  mRCA 20%,  normal LV   Degenerative arthritis of spine    cervical and lumar   Diverticulosis of colon (without mention of hemorrhage)    Dysuria    chronic   Elevated PSA    Feeling of incomplete bladder emptying    Hiatal hernia    moderate per ct 11/ 2017   History of adenomatous polyp of colon    2008   History of atrial fibrillation    remote hx episode   History of COVID-19 03/05/2020   History of pancreatitis    07-01-2013  and 10-10-2015   History of prostatitis    2014   Hx of colonic polyps    Hyperlipidemia  Hypertension    Mouth sore    roof of mouth sore    Nephrolithiasis    Nocturia more than twice per night    severe  w/ leakage   OSA (obstructive sleep apnea)    INTOLERANT CPAP   Osteoarthritis    Pernicious anemia    B12  Def.   Peyronie disease    Renal insufficiency    Sepsis ([place]) 03/22/2017   Thrombocytopenia, unspecified ([place]) hemotology/oncologist-  dr Waymon Budge   per dr Velta Addison note 06/ 2016  secondary to vitro clumping   Vitamin B deficiency     Past Surgical History:  Procedure Laterality Date   BONE MARROW BIOPSY  2011   normal   CARDIAC CATHETERIZATION  2000   per dr Kathyrn Drown note --  dLAD 60-70%,  small intermediate branch 70-80%,  mRCA 20%,  normal LV   CARDIOVASCULAR STRESS TEST  01-29-2013   dr Stanford Breed   normal nuclear study w/ no ischemia,  normal LV function and wall motion , ef 82%   CATARACT EXTRACTION W/ INTRAOCULAR LENS  IMPLANT, BILATERAL  08/2015   CHOLECYSTECTOMY  11/24/2010   Procedure: LAPAROSCOPIC CHOLECYSTECTOMY WITH INTRAOPERATIVE CHOLANGIOGRAM;  Surgeon: Earnstine Regal, MD;  Location: [facility];  Service: General;  Laterality: N/A;   c-arm   CYSTOSCOPY W/ URETERAL STENT PLACEMENT Left 12/17/2015   Procedure: CYSTOSCOPY WITH RETROGRADE PYELOGRAM/URETERAL STENT PLACEMENT;  Surgeon: Ardis Hughs, MD;  Location: [facility];  Service: Urology;  Laterality: Left;   CYSTOSCOPY WITH RETROGRADE PYELOGRAM, URETEROSCOPY AND STENT PLACEMENT Left 12/22/2015   Procedure: CYSTOSCOPY WITH LEFT RETROGRADE  URETEROSCOPY AND STENT PLACEMENT;  Surgeon: Carolan Clines, MD;  Location: [facility];  Service: Urology;  Laterality: Left;   ERCP N/A 07/04/2013   Procedure: ENDOSCOPIC RETROGRADE CHOLANGIOPANCREATOGRAPHY (ERCP);  Surgeon: Gatha Mayer, MD;  Location: [facility];  Service: Endoscopy;  Laterality: N/A;  MAC if available   ERCP N/A 10/12/2015   Procedure: ENDOSCOPIC RETROGRADE CHOLANGIOPANCREATOGRAPHY (ERCP);  Surgeon: [name], MD;  Location: [facility];  Service: Endoscopy;  Laterality: N/A;   HOLMIUM LASER APPLICATION Left 33/02/9516   Procedure: HOLMIUM LASER APPLICATION;  Surgeon: Carolan Clines, MD;  Location: [facility];  Service: Urology;  Laterality: Left;   INGUINAL HERNIA REPAIR Right 01/25/2002   KNEE ARTHROSCOPY  x5   SATURATION BIOPSY OF PROSTATE  05-29-2007  and 01-26-2008   SHOULDER ARTHROSCOPY WITH OPEN ROTATOR CUFF REPAIR AND DISTAL CLAVICLE ACROMINECTOMY Right 11/19/2004   TOTAL HIP ARTHROPLASTY Right 05/21/2009   TOTAL KNEE ARTHROPLASTY Bilateral left 05-02-2003/  right  03-23-2010   TRANSTHORACIC ECHOCARDIOGRAM  12/23/2004   ef 60%, mild MV calcification without stenosis/  mild TR,  PASP 78mHg   UVULOPALATOPHARYNGOPLASTY  1993    w/  T & A   Family History:  Family History  Problem Relation Age of Onset   Peripheral vascular disease Mother    Colon cancer Father 473  Breast cancer Daughter    Breast cancer Daughter    Family Psychiatric  History: Unremarkable Tobacco Screening:   Social History:  Social History   Substance and Sexual Activity  Alcohol Use No    Alcohol/week: 0.0 standard drinks of alcohol     Social History   Substance and Sexual Activity  Drug Use No    Additional Social History:  Allergies:   Allergies  Allergen Reactions   Penicillins Hives and Other (See Comments)    [place], passed out Tolerates cephalosporins  Has [name] had a PCN reaction causing immediate rash, facial/tongue/throat swelling, SOB or lightheadedness with hypotension:  yes Has [name] had a PCN reaction causing severe rash involving mucus membranes or skin necrosis: no Has [name] had a PCN reaction that required hospitalization: no Has [name] had a PCN reaction occurring within the last 10 years: no If all of the above answers are "NO", then may proceed with Cephalosporin use.    Tape Hives and Other (See Comments)    Paper Tape   Lab Results:  Results for orders placed or performed during the hospital encounter of 09/04/21 (from the past 48 hour(s))  Resp Panel by RT-PCR (Flu A&B, Covid) Anterior Nasal Swab     Status: None   Collection Time: 09/17/21 11:46 AM   Specimen: Anterior Nasal Swab  Result Value Ref Range   SARS Coronavirus 2 by RT PCR NEGATIVE NEGATIVE    Comment: (NOTE) SARS-CoV-2 target nucleic acids are NOT DETECTED.  The SARS-CoV-2 RNA is generally detectable in upper respiratory specimens during the acute phase of infection. The lowest concentration of SARS-CoV-2 viral copies this assay can detect is 138 copies/mL. A negative result does not preclude SARS-Cov-2 infection and should not be used as the sole basis for treatment or other [name] management decisions. A negative result may occur with  improper specimen collection/handling, submission of specimen other than nasopharyngeal swab, presence of viral mutation(s) within the areas targeted by this assay, and inadequate number of viral copies(<138 copies/mL). A negative result must be combined with clinical observations, [name]  history, and epidemiological information. The expected result is Negative.  Fact Sheet for Patients:  EntrepreneurPulse.com.au  Fact Sheet for Healthcare Providers:  IncredibleEmployment.be  This test is no t yet approved or cleared by the [place] FDA and  has been authorized for detection and/or diagnosis of SARS-CoV-2 by FDA under an Emergency Use Authorization (EUA). This EUA will remain  in effect (meaning this test can be used) for the duration of the COVID-19 declaration under Section 564(b)(1) of the Act, 21 U.S.C.section 360bbb-3(b)(1), unless the authorization is terminated  or revoked sooner.       Influenza A by PCR NEGATIVE NEGATIVE   Influenza B by PCR NEGATIVE NEGATIVE    Comment: (NOTE) The Xpert Xpress SARS-CoV-2/FLU/RSV plus assay is intended as an aid in the diagnosis of influenza from Nasopharyngeal swab specimens and should not be used as a sole basis for treatment. Nasal washings and aspirates are unacceptable for Xpert Xpress SARS-CoV-2/FLU/RSV testing.  Fact Sheet for Patients: EntrepreneurPulse.com.au  Fact Sheet for Healthcare Providers: IncredibleEmployment.be  This test is not yet approved or cleared by the [place] FDA and has been authorized for detection and/or diagnosis of SARS-CoV-2 by FDA under an Emergency Use Authorization (EUA). This EUA will remain in effect (meaning this test can be used) for the duration of the COVID-19 declaration under Section 564(b)(1) of the Act, 21 U.S.C. section 360bbb-3(b)(1), unless the authorization is terminated or revoked.  Performed at [facility], [place] [address], [place], [place] [address]     Blood Alcohol level:  No results found for: "[facility]"  Metabolic Disorder Labs:  Lab Results  Component Value Date   HGBA1C 5.2 07/01/2017   No results found for: "PROLACTIN" Lab Results  Component Value  Date   CHOL 127 09/07/2019   TRIG 101.0 09/07/2019  HDL 33.80 (L) 09/07/2019   CHOLHDL 4 09/07/2019   VLDL 20.2 09/07/2019   LDLCALC 73 09/07/2019   LDLCALC 117 (H) 08/22/2018    Current Medications: Current Facility-Administered Medications  Medication Dose Route Frequency Provider Last Rate Last Admin   acetaminophen (TYLENOL) tablet 650 mg  650 mg Oral Q6H PRN Starkes-Perry, Gayland Curry, FNP       albuterol (PROVENTIL) (2.5 MG/3ML) 0.083% nebulizer solution 2.5 mg  2.5 mg Nebulization Q2H PRN Starkes-Perry, Gayland Curry, FNP       alum & mag hydroxide-simeth (MAALOX/MYLANTA) 200-200-20 MG/5ML suspension 30 mL  30 mL Oral Q4H PRN Starkes-Perry, Gayland Curry, FNP       feeding supplement (NEPRO CARB STEADY) liquid 237 mL  237 mL Oral TID BM Parks Ranger, DO       finasteride (PROSCAR) tablet 5 mg  5 mg Oral Daily Suella Broad, FNP   5 mg at 09/18/21 1012   fluticasone (FLONASE) 50 MCG/ACT nasal spray 1 spray  1 spray Each Nare BID Suella Broad, FNP   1 spray at 09/18/21 1013   gabapentin (NEURONTIN) capsule 100 mg  100 mg Oral BID Suella Broad, FNP   100 mg at 09/18/21 1014   loratadine (CLARITIN) tablet 10 mg  10 mg Oral Daily Suella Broad, FNP   10 mg at 09/18/21 1013   LORazepam (ATIVAN) tablet 0.5 mg  0.5 mg Oral Q6H PRN Suella Broad, FNP       magnesium hydroxide (MILK OF MAGNESIA) suspension 30 mL  30 mL Oral Daily PRN Starkes-Perry, Gayland Curry, FNP       melatonin tablet 5 mg  5 mg Oral QHS Suella Broad, FNP   5 mg at 09/17/21 2127   multivitamin with minerals tablet 1 tablet  1 tablet Oral Daily Parks Ranger, DO   1 tablet at 09/18/21 1013   polyethylene glycol (MIRALAX / GLYCOLAX) packet 17 g  17 g Oral Daily PRN Starkes-Perry, Gayland Curry, FNP       polyvinyl alcohol (LIQUIFILM TEARS) 1.4 % ophthalmic solution 1 drop  1 drop Both Eyes PRN Starkes-Perry, Gayland Curry, FNP       sertraline (ZOLOFT) tablet 100 mg  100 mg Oral  Daily Suella Broad, FNP   100 mg at 09/18/21 1012   tamsulosin (FLOMAX) capsule 0.8 mg  0.8 mg Oral Daily Suella Broad, FNP   0.8 mg at 09/18/21 1013   traZODone (DESYREL) tablet 50 mg  50 mg Oral QHS Suella Broad, FNP   50 mg at 09/17/21 2128   PTA Medications: Medications Prior to Admission  Medication Sig Dispense Refill Last Dose   acetaminophen (TYLENOL) 500 MG tablet Take 1,000 mg by mouth in the morning and at bedtime.      Artificial Tear Solution (TEARS NATURALE OP) Place 1-2 drops into both eyes at bedtime as needed (dry eyes).      diclofenac Sodium (VOLTAREN ARTHRITIS PAIN) 1 % GEL Apply 2 g topically 3 (three) times daily as needed. ([name] taking differently: Apply 2 g topically 3 (three) times daily as needed (left shoulder and arm pain).) 100 g 3    Ensure (ENSURE) Take 237 mLs by mouth daily. Chocolate      finasteride (PROSCAR) 5 MG tablet Take 1 tablet (5 mg total) by mouth daily.      fluticasone (FLONASE) 50 MCG/ACT nasal spray Place 1 spray into both nostrils 2 (two) times daily. 48 g  3    gabapentin (NEURONTIN) 100 MG capsule Take 1 capsule (100 mg total) by mouth 2 (two) times daily.      GEMTESA 75 MG TABS Take 1 tablet by mouth daily.      loratadine (CLARITIN) 10 MG tablet Take 1 tablet (10 mg total) by mouth daily. For allergies 90 tablet 3    sertraline (ZOLOFT) 100 MG tablet Take 1 tablet (100 mg total) by mouth daily.      tamsulosin (FLOMAX) 0.4 MG CAPS capsule Take 2 capsules (0.8 mg total) by mouth daily. For urine flow ([name] taking differently: Take 0.8 mg by mouth daily.) 180 capsule 0    traZODone (DESYREL) 50 MG tablet Take 1 tablet (50 mg total) by mouth at bedtime.       Musculoskeletal: Strength & Muscle Tone: within normal limits Gait & Station: normal [name] leans: N/A            Psychiatric Specialty Exam:  Presentation  General Appearance: Appropriate for Environment; Casual  Eye  Contact:Fair  Speech:Clear and Coherent; Normal Rate  Speech Volume:Normal  Handedness:Right   Mood and Affect  Mood:Anxious; Depressed  Affect:Congruent; Appropriate   Thought Process  Thought Processes:Coherent; Linear  Duration of Psychotic Symptoms: No data recorded Past Diagnosis of Schizophrenia or Psychoactive disorder: No data recorded Descriptions of Associations:Intact  Orientation:Full (Time, Place and Person)  Thought Content:Logical  Hallucinations:No data recorded Ideas of Reference:None  Suicidal Thoughts:No data recorded Homicidal Thoughts:No data recorded  Sensorium  Memory:Immediate Fair; Recent Fair; Remote Fair  Judgment:Fair  Insight:Shallow   Executive Functions  Concentration:Fair  Attention Span:Fair  [place]   Psychomotor Activity  Psychomotor Activity:No data recorded  Assets  Assets:Communication Skills; Desire for Improvement; Financial Resources/Insurance; Housing; Leisure Time; Physical Health; Resilience; Social Support   Sleep  Sleep:No data recorded   Physical Exam: Physical Exam Vitals and nursing note reviewed.  Constitutional:      Appearance: Normal appearance. [name] is normal weight.  HENT:     Head: Normocephalic and atraumatic.     Nose: Nose normal.     Mouth/Throat:     Pharynx: Oropharynx is clear.  Eyes:     Extraocular Movements: Extraocular movements intact.     Pupils: Pupils are equal, round, and reactive to light.  Cardiovascular:     Rate and Rhythm: Normal rate and regular rhythm.     Pulses: Normal pulses.     Heart sounds: Normal heart sounds.  Pulmonary:     Effort: Pulmonary effort is normal.     Breath sounds: Normal breath sounds.  Abdominal:     General: Abdomen is flat. Bowel sounds are normal.     Palpations: Abdomen is soft.  Musculoskeletal:        General: Normal range of motion.     Cervical back: Normal range of motion and  neck supple.  Skin:    General: Skin is warm and dry.  Neurological:     General: No focal deficit present.     Mental Status: [name] is alert and oriented to person, place, and time.  Psychiatric:        Attention and Perception: Attention and perception normal.        Mood and Affect: Mood and affect normal.        Speech: Speech normal.        Behavior: Behavior normal. Behavior is cooperative.        Thought Content: Thought content normal.  Cognition and Memory: Cognition and memory normal.        Judgment: Judgment is impulsive.    Review of Systems  Constitutional: Negative.   HENT: Negative.    Eyes: Negative.   Respiratory: Negative.    Cardiovascular: Negative.   Gastrointestinal: Negative.   Genitourinary: Negative.   Musculoskeletal: Negative.   Skin: Negative.   Neurological: Negative.   Endo/Heme/Allergies: Negative.   Psychiatric/Behavioral: Negative.     Blood pressure (!) 105/53, pulse 90, temperature 98 F (36.7 C), temperature source Oral, resp. rate 16, height 5' (1.524 m), weight 59 kg, SpO2 98 %. Body mass index is 25.39 kg/m.  Treatment Plan Summary: Daily contact with [name] to assess and evaluate symptoms and progress in treatment, Medication management, and Plan [name] does look a little underweight and [name] has been taking trazodone at home and switch it to Zyprexa so that hopefully [name] will address depression and negative thinking.  Observation Level/Precautions:  15 minute checks  Laboratory:  CBC Chemistry Profile  Psychotherapy:    Medications:    Consultations:    Discharge Concerns:    Estimated LOS:  Other:     Physician Treatment Plan for Primary Diagnosis: MDD (major depressive disorder), recurrent episode, severe ([place]) Long Term Goal(s): Improvement in symptoms so as ready for discharge  Short Term Goals: Ability to identify changes in lifestyle to reduce recurrence of condition will improve, Ability to verbalize feelings will  improve, Ability to disclose and discuss suicidal ideas, Ability to demonstrate self-control will improve, Ability to identify and develop effective coping behaviors will improve, Ability to maintain clinical measurements within normal limits will improve, Compliance with prescribed medications will improve, and Ability to identify triggers associated with substance abuse/mental health issues will improve  Physician Treatment Plan for Secondary Diagnosis: Principal Problem:   MDD (major depressive disorder), recurrent episode, severe ([place])  I certify that inpatient services furnished can reasonably be expected to improve the [name]'s condition.    Parks Ranger, DO 9/1/202310:24 AM

## 2021-09-18 NOTE — Plan of Care (Signed)
Pt is withdrawn and lethargic. Compliant to meds. No SI. VS are stable. Pt stays in his room most of the shift.No other issues.

## 2021-09-19 DIAGNOSIS — F332 Major depressive disorder, recurrent severe without psychotic features: Secondary | ICD-10-CM | POA: Diagnosis not present

## 2021-09-19 MED ORDER — HYDROCORTISONE 1 % EX CREA
TOPICAL_CREAM | Freq: Two times a day (BID) | CUTANEOUS | Status: DC
  Administered 2021-09-19 – 2021-09-23 (×4): 1 via TOPICAL
  Filled 2021-09-19 (×3): qty 28

## 2021-09-19 NOTE — Progress Notes (Signed)
    D:  Patient alert and oriented x4.  Patient reported he slept well.  Patient reports he typically doesn't eat large meals, but would come down for breakfast.  Patient denies SI/HI, AVH, anxiety and depression.  Denies pain. Patient is pleasant and cooperative.     A:  Medications administered as ordered.  Emotional support and encouragement offered.  Frequent verbal interaction with patient.  Q15 minute checks in place for safety.   R:  Patient compliant with medications.  No adverse reactions noted.  Patient contracts for safety. Minimal interaction with peers.  Patient remains safe on the unit.

## 2021-09-19 NOTE — Plan of Care (Signed)

## 2021-09-19 NOTE — Progress Notes (Signed)
[facility] MD Progress Note  09/19/2021 2:30 PM [name]  MRN:  [id number] Subjective: [name] is seen on rounds.  He has been very pleasant and cooperative with staff and peers.  He is compliant with his medications.  He denies any problems or issues except for he has a old tick bite.  Principal Problem: MDD (major depressive disorder), recurrent episode, severe ([place]) Diagnosis: Principal Problem:   MDD (major depressive disorder), recurrent episode, severe ([place])  Total Time spent with patient: 15 minutes  Past Psychiatric History: None  Past Medical History:  Past Medical History:  Diagnosis Date   Arthralgia of multiple joints    limited mobility w/  independant adl's   BPH with obstruction/lower urinary tract symptoms    Chronic idiopathic monocytosis    followed by dr Waymon Budge--  per lov note 06/ 2017 persistant unexplained  with normal bone barrow bx   Coronary atherosclerosis of unspecified type of vessel, native or graft    cardiologist-  dr Stanford Breed -- per lov note 11-19-2014 ,  cardiac cath in 2000-- pLAD 60-70%,  small intermediate branch 70-80%,  mRCA 20%,  normal LV   Degenerative arthritis of spine    cervical and lumar   Diverticulosis of colon (without mention of hemorrhage)    Dysuria    chronic   Elevated PSA    Feeling of incomplete bladder emptying    Hiatal hernia    moderate per ct 11/ 2017   History of adenomatous polyp of colon    2008   History of atrial fibrillation    remote hx episode   History of COVID-19 03/05/2020   History of pancreatitis    07-01-2013  and 10-10-2015   History of prostatitis    2014   Hx of colonic polyps    Hyperlipidemia    Hypertension    Mouth sore    roof of mouth sore    Nephrolithiasis    Nocturia more than twice per night    severe  w/ leakage   OSA (obstructive sleep apnea)    INTOLERANT CPAP   Osteoarthritis    Pernicious anemia    B12  Def.   Peyronie disease    Renal insufficiency    Sepsis ([place])  03/22/2017   Thrombocytopenia, unspecified ([place]) hemotology/oncologist-  dr Waymon Budge   per dr Velta Addison note 06/ 2016  secondary to vitro clumping   Vitamin B deficiency     Past Surgical History:  Procedure Laterality Date   BONE MARROW BIOPSY  2011   normal   CARDIAC CATHETERIZATION  2000   per dr Kathyrn Drown note --  dLAD 60-70%,  small intermediate branch 70-80%,  mRCA 20%,  normal LV   CARDIOVASCULAR STRESS TEST  01-29-2013   dr Stanford Breed   normal nuclear study w/ no ischemia,  normal LV function and wall motion , ef 82%   CATARACT EXTRACTION W/ INTRAOCULAR LENS  IMPLANT, BILATERAL  08/2015   CHOLECYSTECTOMY  11/24/2010   Procedure: LAPAROSCOPIC CHOLECYSTECTOMY WITH INTRAOPERATIVE CHOLANGIOGRAM;  Surgeon: Earnstine Regal, MD;  Location: [facility];  Service: General;  Laterality: N/A;  c-arm   CYSTOSCOPY W/ URETERAL STENT PLACEMENT Left 12/17/2015   Procedure: CYSTOSCOPY WITH RETROGRADE PYELOGRAM/URETERAL STENT PLACEMENT;  Surgeon: Ardis Hughs, MD;  Location: [facility];  Service: Urology;  Laterality: Left;   CYSTOSCOPY WITH RETROGRADE PYELOGRAM, URETEROSCOPY AND STENT PLACEMENT Left 12/22/2015   Procedure: CYSTOSCOPY WITH LEFT RETROGRADE  URETEROSCOPY AND STENT PLACEMENT;  Surgeon: Carolan Clines, MD;  Location: [facility];  Service: Urology;  Laterality: Left;   ERCP N/A 07/04/2013   Procedure: ENDOSCOPIC RETROGRADE CHOLANGIOPANCREATOGRAPHY (ERCP);  Surgeon: Gatha Mayer, MD;  Location: [facility];  Service: Endoscopy;  Laterality: N/A;  MAC if available   ERCP N/A 10/12/2015   Procedure: ENDOSCOPIC RETROGRADE CHOLANGIOPANCREATOGRAPHY (ERCP);  Surgeon: [name], MD;  Location: [facility];  Service: Endoscopy;  Laterality: N/A;   HOLMIUM LASER APPLICATION Left 80/09/9831   Procedure: HOLMIUM LASER APPLICATION;  Surgeon: Carolan Clines, MD;  Location: [facility];  Service: Urology;  Laterality: Left;   INGUINAL HERNIA REPAIR Right  01/25/2002   KNEE ARTHROSCOPY  x5   SATURATION BIOPSY OF PROSTATE  05-29-2007  and 01-26-2008   SHOULDER ARTHROSCOPY WITH OPEN ROTATOR CUFF REPAIR AND DISTAL CLAVICLE ACROMINECTOMY Right 11/19/2004   TOTAL HIP ARTHROPLASTY Right 05/21/2009   TOTAL KNEE ARTHROPLASTY Bilateral left 05-02-2003/  right  03-23-2010   TRANSTHORACIC ECHOCARDIOGRAM  12/23/2004   ef 60%, mild MV calcification without stenosis/  mild TR,  PASP 45mHg   UVULOPALATOPHARYNGOPLASTY  1993    w/  T & A   Family History:  Family History  Problem Relation Age of Onset   Peripheral vascular disease Mother    Colon cancer Father 481  Breast cancer Daughter    Breast cancer Daughter     Social History:  Social History   Substance and Sexual Activity  Alcohol Use No   Alcohol/week: 0.0 standard drinks of alcohol     Social History   Substance and Sexual Activity  Drug Use No    Social History   Socioeconomic History   Marital status: Widowed    Spouse name: Not on file   Number of children: 2   Years of education: Not on file   Highest education level: Not on file  Occupational History   Occupation: retired    [employer] farm manager RETIRED  Tobacco Use   Smoking status: Former    Types: Pipe    Quit date: 01/18/1966    Years since quitting: 55.7   Smokeless tobacco: Never  Vaping Use   Vaping Use: Never used  Substance and Sexual Activity   Alcohol use: No    Alcohol/week: 0.0 standard drinks of alcohol   Drug use: No   Sexual activity: Not Currently  Other Topics Concern   Not on file  Social History Narrative   DNR   Widowed   2 daughters leave near by   Social Determinants of Health   Financial Resource Strain: Low Risk  (08/21/2021)   Overall Financial Resource Strain (CARDIA)    Difficulty of Paying Living Expenses: Not hard at all  Food Insecurity: No Food Insecurity (08/21/2021)   Hunger Vital Sign    Worried About Running Out of Food in the Last Year: Never true    [place]  [place]: Never true  Transportation Needs: No Transportation Needs (08/21/2021)   PRAPARE - THydrologist(Medical): No    Lack of Transportation (Non-Medical): No  Physical Activity: Inactive (08/21/2021)   Exercise Vital Sign    Days of Exercise per Week: 0 days    Minutes of Exercise per Session: 0 min  Stress: Stress Concern Present (08/21/2021)   [place]   Feeling of Stress : To some extent  Social Connections: Not on file   Additional Social History:  Sleep: Good  Appetite:  Good  Current Medications: Current Facility-Administered Medications  Medication Dose Route Frequency Provider Last Rate Last Admin   acetaminophen (TYLENOL) tablet 650 mg  650 mg Oral Q6H PRN Starkes-Perry, Gayland Curry, FNP       albuterol (PROVENTIL) (2.5 MG/3ML) 0.083% nebulizer solution 2.5 mg  2.5 mg Nebulization Q2H PRN Starkes-Perry, Gayland Curry, FNP       alum & mag hydroxide-simeth (MAALOX/MYLANTA) 200-200-20 MG/5ML suspension 30 mL  30 mL Oral Q4H PRN Starkes-Perry, Gayland Curry, FNP       ciprofloxacin-dexamethasone (CIPRODEX) 0.3-0.1 % OTIC (EAR) suspension 4 drop  4 drop Left EAR BID Parks Ranger, DO   4 drop at 09/19/21 0853   feeding supplement (NEPRO CARB STEADY) liquid 237 mL  237 mL Oral TID BM Parks Ranger, DO   237 mL at 09/19/21 0910   finasteride (PROSCAR) tablet 5 mg  5 mg Oral Daily Suella Broad, FNP   5 mg at 09/19/21 0854   fluticasone (FLONASE) 50 MCG/ACT nasal spray 1 spray  1 spray Each Nare BID Suella Broad, FNP   1 spray at 09/19/21 0855   gabapentin (NEURONTIN) capsule 100 mg  100 mg Oral BID Suella Broad, FNP   100 mg at 09/19/21 5956   hydrocortisone cream 1 %   Topical BID Parks Ranger, DO       loratadine (CLARITIN) tablet 10 mg  10 mg Oral Daily Suella Broad, FNP   10 mg at 09/19/21 0855    LORazepam (ATIVAN) tablet 0.5 mg  0.5 mg Oral Q6H PRN Suella Broad, FNP       magnesium hydroxide (MILK OF MAGNESIA) suspension 30 mL  30 mL Oral Daily PRN Starkes-Perry, Gayland Curry, FNP       melatonin tablet 5 mg  5 mg Oral QHS Suella Broad, FNP   5 mg at 09/18/21 2154   multivitamin with minerals tablet 1 tablet  1 tablet Oral Daily Parks Ranger, DO   1 tablet at 09/19/21 0854   OLANZapine (ZYPREXA) tablet 5 mg  5 mg Oral QHS Parks Ranger, DO   5 mg at 09/18/21 2154   polyethylene glycol (MIRALAX / GLYCOLAX) packet 17 g  17 g Oral Daily PRN Starkes-Perry, Gayland Curry, FNP       polyvinyl alcohol (LIQUIFILM TEARS) 1.4 % ophthalmic solution 1 drop  1 drop Both Eyes PRN Starkes-Perry, Gayland Curry, FNP       sertraline (ZOLOFT) tablet 100 mg  100 mg Oral Daily Suella Broad, FNP   100 mg at 09/19/21 0854   tamsulosin (FLOMAX) capsule 0.8 mg  0.8 mg Oral Daily Suella Broad, FNP   0.8 mg at 09/19/21 0854   traZODone (DESYREL) tablet 50 mg  50 mg Oral QHS PRN Parks Ranger, DO   50 mg at 09/18/21 2154    Lab Results: No results found for this or any previous visit (from the past 48 hour(s)).  Blood Alcohol level:  No results found for: "ETH"  Metabolic Disorder Labs: Lab Results  Component Value Date   HGBA1C 5.2 07/01/2017   No results found for: "PROLACTIN" Lab Results  Component Value Date   CHOL 127 09/07/2019   TRIG 101.0 09/07/2019   HDL 33.80 (L) 09/07/2019   CHOLHDL 4 09/07/2019   VLDL 20.2 09/07/2019   LDLCALC 73 09/07/2019   LDLCALC 117 (H) 08/22/2018    Physical Findings: AIMS:  , ,  ,  ,  CIWA:    COWS:     Musculoskeletal: Strength & Muscle Tone: within normal limits Gait & Station: normal Patient leans: N/A  Psychiatric Specialty Exam:  Presentation  General Appearance: Appropriate for Environment; Casual  Eye Contact:Fair  Speech:Clear and Coherent; Normal Rate  Speech  Volume:Normal  Handedness:Right   Mood and Affect  Mood:Anxious; Depressed  Affect:Congruent; Appropriate   Thought Process  Thought Processes:Coherent; Linear  Descriptions of Associations:Intact  Orientation:Full (Time, Place and Person)  Thought Content:Logical  History of Schizophrenia/Schizoaffective disorder:No data recorded Duration of Psychotic Symptoms:No data recorded Hallucinations:No data recorded Ideas of Reference:None  Suicidal Thoughts:No data recorded Homicidal Thoughts:No data recorded  Sensorium  Memory:Immediate Fair; Recent Fair; Remote Fair  Judgment:Fair  Insight:Shallow   Executive Functions  Concentration:Fair  Attention Span:Fair  [place]   Psychomotor Activity  Psychomotor Activity:No data recorded  Assets  Assets:Communication Skills; Desire for Improvement; Financial Resources/Insurance; Housing; Leisure Time; Physical Health; Resilience; Social Support   Sleep  Sleep:No data recorded    Blood pressure (!) 117/54, pulse (!) 58, temperature 98.3 F (36.8 C), temperature source Oral, resp. rate 18, height 5' (1.524 m), weight 59 kg, SpO2 97 %. Body mass index is 25.39 kg/m.   Treatment Plan Summary: Daily contact with patient to assess and evaluate symptoms and progress in treatment, Medication management, and Plan continue current medications.  Hydrocortisone for his tick bite.  Parks Ranger, DO 09/19/2021, 2:30 PM

## 2021-09-19 NOTE — Progress Notes (Signed)
Group note:   Group held outside to get some fresh air and vitamin D. Discussed coping skills and ways to improve depression and anxiety, with vitamin D from the sun being one of those factors.   Patient did not attend group.

## 2021-09-19 NOTE — Progress Notes (Signed)
Patient did not come to dinning room for lunch.  Patient stated he was full from breakfast and is not used to eating such large meals.  Patient offered fluids.  Patient denied any other needs at this time.

## 2021-09-19 NOTE — Progress Notes (Signed)
Patient complaining of an old tick bite (on left thigh) that is itching.  Dr. Louis Meckel made aware.  Medication ordered.

## 2021-09-19 NOTE — Progress Notes (Signed)
D Alert and Oriented ambulating with front wheel walker.  A Scheduled medications administered per Provider order. Support and encouragement provided. Routine safety checks conducted every 15 minutes. Patient notified to inform staff with problems or concerns.  R. No adverse drug reactions noted. Patient contracts for safety at this time. Will continue to monitor.

## 2021-09-20 DIAGNOSIS — F332 Major depressive disorder, recurrent severe without psychotic features: Secondary | ICD-10-CM | POA: Diagnosis not present

## 2021-09-20 MED ORDER — SENNOSIDES-DOCUSATE SODIUM 8.6-50 MG PO TABS
1.0000 | ORAL_TABLET | Freq: Every day | ORAL | Status: DC
  Administered 2021-09-20 – 2021-09-22 (×3): 1 via ORAL
  Filled 2021-09-20 (×3): qty 1

## 2021-09-20 NOTE — Progress Notes (Addendum)
Patient's abdomen is distended and firm.  Patient keeps stating he is full, but is not eating.  Stool seepage noted during patient's shower.  Dr. Louis Meckel made aware.   Per Dr. Louis Meckel, labs to be ordered in the morning, assuming VS remain stable.   Staff providing fluids (thickened apple juice and Nepro Carb Steady).

## 2021-09-20 NOTE — [facility] Suicide Risk Assessment (Addendum)
[place] INPATIENT:  Family/Significant Other Suicide Prevention Education  Suicide Prevention Education:  Education Completed; Greig Right [phone]), has been identified by the patient as the family member/significant other with whom the patient will be residing, and identified as the person(s) who will aid the patient in the event of a mental health crisis (suicidal ideations/suicide attempt).  With written consent from the patient, the family member/significant other has been provided the following suicide prevention education, prior to the and/or following the discharge of the patient.  The suicide prevention education provided includes the following: Suicide risk factors Suicide prevention and interventions National Suicide Hotline telephone number [facility] assessment telephone number [facility] Emergency Assistance [place] and/or Residential Mobile Crisis Unit telephone number  Request made of family/significant other to: Remove weapons (e.g., guns, rifles, knives), all items previously/currently identified as safety concern.   Remove drugs/medications (over-the-counter, prescriptions, illicit drugs), all items previously/currently identified as a safety concern.  The family member/significant other verbalizes understanding of the suicide prevention education information provided.  The family member/significant other agrees to remove the items of safety concern listed above.  Loletha Grayer shared that her father is going to be 86 years old in ten days. Pt has been dealing with declining health issues and was recently diagnosed with leukemia (7/18). She reported several falls and scoliosis. Loletha Grayer stated that pt is struggling with both physical and mental health. She shared that for the past six to eight months pt has been sharing his thoughts of ending his life. Loletha Grayer explained that her mother died from cancer and her dad misses her. She shared that they have  not told her father about his diagnosis as this would put extra strain on pt. Loletha Grayer shared that she took her mother's medications to be disposed of by the [facility] police. However, pt must have had some left somewhere and attempted to overdose on them. She shared that she does not feel believe he is a danger to anyone else but she did share that her and her sister have worked on removing the weapons from his home.   Shirl Harris 09/20/2021, 3:26 PM

## 2021-09-20 NOTE — Progress Notes (Signed)
    D:  Patient alert and oriented to person, place and situation.  Patient reports he slept well and was agreeable to come down for breakfast.  Patient denies SI/HI and AVH. Patient endorses having "lots" of anxiety.  "I've just messed up the rest of my life."  Patient denied depression.   Patient did not come down to lunch because he was not hungry.   Patient took a shower with staff assistance and shower chair.   A:  Medications administered as ordered.  Emotional support and encouragement offered.  Frequent verbal interaction with patient.  Q15 minute checks in place for safety.   R:  Patient compliant with medications.  No adverse reactions noted.  Patient contracts for safety. Patient visible in the milieu.

## 2021-09-20 NOTE — Plan of Care (Signed)
Pt is AOX4. Patient stays in his room most of the shift. Denies SI/HI.  Reported mild pain in thr shoulder.Patient took his night meds  VS are stable. Compliant to med. Ate snacks and meal. Patient went to bed after meds and asleep. Patient daughter visited by daughter. Pt encouraged to attend groups.

## 2021-09-20 NOTE — Progress Notes (Deleted)
Signed                      Pt is AOX4. Patient stays in his room most of the shift. Denies SI/HI.  Reported mild pain in thr shoulder.Patient took his night meds  VS are stable. Compliant to med. Ate snacks and meal. Patient went to bed after meds and asleep. Patient daughter visited by daughter. Pt encouraged to attend groups.

## 2021-09-20 NOTE — Progress Notes (Signed)
[facility] MD Progress Note  09/20/2021 3:42 PM [name]  MRN:  [id number] Subjective: [name] is seen on rounds.  He is doing well.  He has been taking his medications.  No side effects.  He is sleeping well and eating well.  He denies any depression.  He has an old tick bite on his leg that has turned into a granuloma.  Principal Problem: MDD (major depressive disorder), recurrent episode, severe ([place]) Diagnosis: Principal Problem:   MDD (major depressive disorder), recurrent episode, severe ([place])  Total Time spent with patient: 15 minutes  Past Psychiatric History: None  Past Medical History:  Past Medical History:  Diagnosis Date   Arthralgia of multiple joints    limited mobility w/  independant adl's   BPH with obstruction/lower urinary tract symptoms    Chronic idiopathic monocytosis    followed by dr Waymon Budge--  per lov note 06/ 2017 persistant unexplained  with normal bone barrow bx   Coronary atherosclerosis of unspecified type of vessel, native or graft    cardiologist-  dr Stanford Breed -- per lov note 11-19-2014 ,  cardiac cath in 2000-- pLAD 60-70%,  small intermediate branch 70-80%,  mRCA 20%,  normal LV   Degenerative arthritis of spine    cervical and lumar   Diverticulosis of colon (without mention of hemorrhage)    Dysuria    chronic   Elevated PSA    Feeling of incomplete bladder emptying    Hiatal hernia    moderate per ct 11/ 2017   History of adenomatous polyp of colon    2008   History of atrial fibrillation    remote hx episode   History of COVID-19 03/05/2020   History of pancreatitis    07-01-2013  and 10-10-2015   History of prostatitis    2014   Hx of colonic polyps    Hyperlipidemia    Hypertension    Mouth sore    roof of mouth sore    Nephrolithiasis    Nocturia more than twice per night    severe  w/ leakage   OSA (obstructive sleep apnea)    INTOLERANT CPAP   Osteoarthritis    Pernicious anemia    B12  Def.   Peyronie disease     Renal insufficiency    Sepsis ([place]) 03/22/2017   Thrombocytopenia, unspecified ([place]) hemotology/oncologist-  dr Waymon Budge   per dr Velta Addison note 06/ 2016  secondary to vitro clumping   Vitamin B deficiency     Past Surgical History:  Procedure Laterality Date   BONE MARROW BIOPSY  2011   normal   CARDIAC CATHETERIZATION  2000   per dr Kathyrn Drown note --  dLAD 60-70%,  small intermediate branch 70-80%,  mRCA 20%,  normal LV   CARDIOVASCULAR STRESS TEST  01-29-2013   dr Stanford Breed   normal nuclear study w/ no ischemia,  normal LV function and wall motion , ef 82%   CATARACT EXTRACTION W/ INTRAOCULAR LENS  IMPLANT, BILATERAL  08/2015   CHOLECYSTECTOMY  11/24/2010   Procedure: LAPAROSCOPIC CHOLECYSTECTOMY WITH INTRAOPERATIVE CHOLANGIOGRAM;  Surgeon: Earnstine Regal, MD;  Location: [facility];  Service: General;  Laterality: N/A;  c-arm   CYSTOSCOPY W/ URETERAL STENT PLACEMENT Left 12/17/2015   Procedure: CYSTOSCOPY WITH RETROGRADE PYELOGRAM/URETERAL STENT PLACEMENT;  Surgeon: Ardis Hughs, MD;  Location: [facility];  Service: Urology;  Laterality: Left;   CYSTOSCOPY WITH RETROGRADE PYELOGRAM, URETEROSCOPY AND STENT PLACEMENT Left 12/22/2015   Procedure: CYSTOSCOPY WITH LEFT  RETROGRADE  URETEROSCOPY AND STENT PLACEMENT;  Surgeon: Carolan Clines, MD;  Location: [facility];  Service: Urology;  Laterality: Left;   ERCP N/A 07/04/2013   Procedure: ENDOSCOPIC RETROGRADE CHOLANGIOPANCREATOGRAPHY (ERCP);  Surgeon: Gatha Mayer, MD;  Location: [facility];  Service: Endoscopy;  Laterality: N/A;  MAC if available   ERCP N/A 10/12/2015   Procedure: ENDOSCOPIC RETROGRADE CHOLANGIOPANCREATOGRAPHY (ERCP);  Surgeon: [name], MD;  Location: [facility];  Service: Endoscopy;  Laterality: N/A;   HOLMIUM LASER APPLICATION Left 24/05/8097   Procedure: HOLMIUM LASER APPLICATION;  Surgeon: Carolan Clines, MD;  Location: [facility];  Service: Urology;  Laterality: Left;    INGUINAL HERNIA REPAIR Right 01/25/2002   KNEE ARTHROSCOPY  x5   SATURATION BIOPSY OF PROSTATE  05-29-2007  and 01-26-2008   SHOULDER ARTHROSCOPY WITH OPEN ROTATOR CUFF REPAIR AND DISTAL CLAVICLE ACROMINECTOMY Right 11/19/2004   TOTAL HIP ARTHROPLASTY Right 05/21/2009   TOTAL KNEE ARTHROPLASTY Bilateral left 05-02-2003/  right  03-23-2010   TRANSTHORACIC ECHOCARDIOGRAM  12/23/2004   ef 60%, mild MV calcification without stenosis/  mild TR,  PASP 24mHg   UVULOPALATOPHARYNGOPLASTY  1993    w/  T & A   Family History:  Family History  Problem Relation Age of Onset   Peripheral vascular disease Mother    Colon cancer Father 472  Breast cancer Daughter    Breast cancer Daughter     Social History:  Social History   Substance and Sexual Activity  Alcohol Use No   Alcohol/week: 0.0 standard drinks of alcohol     Social History   Substance and Sexual Activity  Drug Use No    Social History   Socioeconomic History   Marital status: Widowed    Spouse name: Not on file   Number of children: 2   Years of education: Not on file   Highest education level: Not on file  Occupational History   Occupation: retired    [employer] farm manager RETIRED  Tobacco Use   Smoking status: Former    Types: Pipe    Quit date: 01/18/1966    Years since quitting: 55.7   Smokeless tobacco: Never  Vaping Use   Vaping Use: Never used  Substance and Sexual Activity   Alcohol use: No    Alcohol/week: 0.0 standard drinks of alcohol   Drug use: No   Sexual activity: Not Currently  Other Topics Concern   Not on file  Social History Narrative   DNR   Widowed   2 daughters leave near by   Social Determinants of Health   Financial Resource Strain: Low Risk  (08/21/2021)   Overall Financial Resource Strain (CARDIA)    Difficulty of Paying Living Expenses: Not hard at all  Food Insecurity: No Food Insecurity (08/21/2021)   Hunger Vital Sign    Worried About Running Out of Food in the Last Year: Never true     RLivoniain the Last Year: Never true  Transportation Needs: No Transportation Needs (08/21/2021)   PRAPARE - THydrologist(Medical): No    Lack of Transportation (Non-Medical): No  Physical Activity: Inactive (08/21/2021)   Exercise Vital Sign    Days of Exercise per Week: 0 days    Minutes of Exercise per Session: 0 min  Stress: Stress Concern Present (08/21/2021)   [place]   Feeling of Stress : To some extent  Social  Connections: Not on file   Additional Social History:                         Sleep: Good  Appetite:  Good  Current Medications: Current Facility-Administered Medications  Medication Dose Route Frequency Provider Last Rate Last Admin   acetaminophen (TYLENOL) tablet 650 mg  650 mg Oral Q6H PRN Starkes-Perry, [place] Curry, FNP       albuterol (PROVENTIL) (2.5 MG/3ML) 0.083% nebulizer solution 2.5 mg  2.5 mg Nebulization Q2H PRN Starkes-Perry, [place] Curry, FNP       alum & mag hydroxide-simeth (MAALOX/MYLANTA) 200-200-20 MG/5ML suspension 30 mL  30 mL Oral Q4H PRN Starkes-Perry, [place] Curry, FNP       ciprofloxacin-dexamethasone (CIPRODEX) 0.3-0.1 % OTIC (EAR) suspension 4 drop  4 drop Left EAR BID Parks Ranger, DO   4 drop at 09/20/21 0923   feeding supplement (NEPRO CARB STEADY) liquid 237 mL  237 mL Oral TID BM Parks Ranger, DO   237 mL at 09/20/21 1413   finasteride (PROSCAR) tablet 5 mg  5 mg Oral Daily Suella Broad, FNP   5 mg at 09/20/21 0822   fluticasone (FLONASE) 50 MCG/ACT nasal spray 1 spray  1 spray Each Nare BID Suella Broad, FNP   1 spray at 09/20/21 4287   gabapentin (NEURONTIN) capsule 100 mg  100 mg Oral BID Suella Broad, FNP   100 mg at 09/20/21 6811   hydrocortisone cream 1 %   Topical BID Parks Ranger, DO   1 Application at 57/26/20 1417   loratadine (CLARITIN) tablet 10 mg  10 mg Oral  Daily Suella Broad, FNP   10 mg at 09/20/21 3559   LORazepam (ATIVAN) tablet 0.5 mg  0.5 mg Oral Q6H PRN Suella Broad, FNP       magnesium hydroxide (MILK OF MAGNESIA) suspension 30 mL  30 mL Oral Daily PRN Starkes-Perry, [place] Curry, FNP       melatonin tablet 5 mg  5 mg Oral QHS Suella Broad, FNP   5 mg at 09/19/21 2151   multivitamin with minerals tablet 1 tablet  1 tablet Oral Daily Parks Ranger, DO   1 tablet at 09/20/21 0821   OLANZapine (ZYPREXA) tablet 5 mg  5 mg Oral QHS Parks Ranger, DO   5 mg at 09/19/21 2151   polyethylene glycol (MIRALAX / GLYCOLAX) packet 17 g  17 g Oral Daily PRN Starkes-Perry, [place] Curry, FNP       polyvinyl alcohol (LIQUIFILM TEARS) 1.4 % ophthalmic solution 1 drop  1 drop Both Eyes PRN Starkes-Perry, [place] Curry, FNP       sertraline (ZOLOFT) tablet 100 mg  100 mg Oral Daily Suella Broad, FNP   100 mg at 09/20/21 7416   tamsulosin (FLOMAX) capsule 0.8 mg  0.8 mg Oral Daily Suella Broad, FNP   0.8 mg at 09/20/21 3845   traZODone (DESYREL) tablet 50 mg  50 mg Oral QHS PRN Parks Ranger, DO   50 mg at 09/18/21 2154    Lab Results: No results found for this or any previous visit (from the past 48 hour(s)).  Blood Alcohol level:  No results found for: "ETH"  Metabolic Disorder Labs: Lab Results  Component Value Date   HGBA1C 5.2 07/01/2017   No results found for: "PROLACTIN" Lab Results  Component Value Date   CHOL 127 09/07/2019   TRIG 101.0  09/07/2019   HDL 33.80 (L) 09/07/2019   CHOLHDL 4 09/07/2019   VLDL 20.2 09/07/2019   LDLCALC 73 09/07/2019   LDLCALC 117 (H) 08/22/2018    Physical Findings: AIMS:  , ,  ,  ,    CIWA:    COWS:     Musculoskeletal: Strength & Muscle Tone: within normal limits Gait & Station: normal Patient leans: N/A  Psychiatric Specialty Exam:  Presentation  General Appearance: Appropriate for Environment; Casual  Eye  Contact:Fair  Speech:Clear and Coherent; Normal Rate  Speech Volume:Normal  Handedness:Right   Mood and Affect  Mood:Anxious; Depressed  Affect:Congruent; Appropriate   Thought Process  Thought Processes:Coherent; Linear  Descriptions of Associations:Intact  Orientation:Full (Time, Place and Person)  Thought Content:Logical  History of Schizophrenia/Schizoaffective disorder:No data recorded Duration of Psychotic Symptoms:No data recorded Hallucinations:No data recorded Ideas of Reference:None  Suicidal Thoughts:No data recorded Homicidal Thoughts:No data recorded  Sensorium  Memory:Immediate Fair; Recent Fair; Remote Fair  Judgment:Fair  Insight:Shallow   Executive Functions  Concentration:Fair  Attention Span:Fair  [place]   Psychomotor Activity  Psychomotor Activity:No data recorded  Assets  Assets:Communication Skills; Desire for Improvement; Financial Resources/Insurance; Housing; Leisure Time; Physical Health; Resilience; Social Support   Sleep  Sleep:No data recorded    Blood pressure (!) 127/57, pulse 81, temperature 97.9 F (36.6 C), temperature source Oral, resp. rate 18, height 5' (1.524 m), weight 59 kg, SpO2 97 %. Body mass index is 25.39 kg/m.   Treatment Plan Summary: Daily contact with patient to assess and evaluate symptoms and progress in treatment, Medication management, and Plan continue current medications.  Parks Ranger, DO 09/20/2021, 3:42 PM

## 2021-09-20 NOTE — Plan of Care (Signed)
Problem: Education: Goal: Knowledge of General Education information will improve Description: Including pain rating scale, medication(s)/side effects and non-pharmacologic comfort measures 09/20/2021 0131 by Lauretta Chester A, RN Outcome: Progressing 09/20/2021 0124 by Lauretta Chester A, RN Outcome: Progressing   Problem: Health Behavior/Discharge Planning: Goal: Ability to manage health-related needs will improve 09/20/2021 0131 by Lauretta Chester A, RN Outcome: Progressing 09/20/2021 0124 by Lauretta Chester A, RN Outcome: Progressing   Problem: Clinical Measurements: Goal: Ability to maintain clinical measurements within normal limits will improve 09/20/2021 0131 by Alfonse Ras, Amman Bartel A, RN Outcome: Progressing 09/20/2021 0124 by Lauretta Chester A, RN Outcome: Progressing Goal: Will remain free from infection 09/20/2021 0131 by Lauretta Chester A, RN Outcome: Progressing 09/20/2021 0124 by Lauretta Chester A, RN Outcome: Progressing Goal: Diagnostic test results will improve 09/20/2021 0131 by Lauretta Chester A, RN Outcome: Progressing 09/20/2021 0124 by Lauretta Chester A, RN Outcome: Progressing Goal: Respiratory complications will improve 09/20/2021 0131 by Lauretta Chester A, RN Outcome: Progressing 09/20/2021 0124 by Lauretta Chester A, RN Outcome: Progressing Goal: Cardiovascular complication will be avoided 09/20/2021 0131 by Lauretta Chester A, RN Outcome: Progressing 09/20/2021 0124 by Lauretta Chester A, RN Outcome: Progressing   Problem: Activity: Goal: Risk for activity intolerance will decrease 09/20/2021 0131 by Lauretta Chester A, RN Outcome: Progressing 09/20/2021 0124 by Lauretta Chester A, RN Outcome: Progressing   Problem: Nutrition: Goal: Adequate nutrition will be maintained 09/20/2021 0131 by Lauretta Chester A, RN Outcome: Progressing 09/20/2021 0124 by Lauretta Chester A, RN Outcome: Progressing   Problem: Coping: Goal: Level of anxiety will  decrease 09/20/2021 0131 by Lauretta Chester A, RN Outcome: Progressing 09/20/2021 0124 by Lauretta Chester A, RN Outcome: Progressing   Problem: Elimination: Goal: Will not experience complications related to bowel motility 09/20/2021 0131 by Lauretta Chester A, RN Outcome: Progressing 09/20/2021 0124 by Lauretta Chester A, RN Outcome: Progressing Goal: Will not experience complications related to urinary retention 09/20/2021 0131 by Lauretta Chester A, RN Outcome: Progressing 09/20/2021 0124 by Lauretta Chester A, RN Outcome: Progressing   Problem: Pain Managment: Goal: General experience of comfort will improve 09/20/2021 0131 by Lauretta Chester A, RN Outcome: Progressing 09/20/2021 0124 by Lauretta Chester A, RN Outcome: Progressing   Problem: Safety: Goal: Ability to remain free from injury will improve 09/20/2021 0131 by Alfonse Ras, Dinna Severs A, RN Outcome: Progressing 09/20/2021 0124 by Lauretta Chester A, RN Outcome: Progressing   Problem: Skin Integrity: Goal: Risk for impaired skin integrity will decrease 09/20/2021 0131 by Lauretta Chester A, RN Outcome: Progressing 09/20/2021 0124 by Lauretta Chester A, RN Outcome: Progressing   Problem: Education: Goal: Knowledge of [place] [facility] Education information/materials will improve 09/20/2021 0131 by Winona Legato, RN Outcome: Progressing 09/20/2021 0124 by Lauretta Chester A, RN Outcome: Progressing Goal: Emotional status will improve 09/20/2021 0131 by Lauretta Chester A, RN Outcome: Progressing 09/20/2021 0124 by Lauretta Chester A, RN Outcome: Progressing Goal: Mental status will improve 09/20/2021 0131 by Lauretta Chester A, RN Outcome: Progressing 09/20/2021 0124 by Lauretta Chester A, RN Outcome: Progressing Goal: Verbalization of understanding the information provided will improve 09/20/2021 0131 by Alfonse Ras, Jessee Mezera A, RN Outcome: Progressing 09/20/2021 0124 by Lauretta Chester A, RN Outcome: Progressing    Problem: Activity: Goal: Interest or engagement in activities will improve 09/20/2021 0131 by Lauretta Chester A, RN Outcome: Progressing 09/20/2021 0124 by Lauretta Chester A, RN Outcome: Progressing Goal: Sleeping patterns will improve 09/20/2021 0131 by Lauretta Chester A, RN Outcome: Progressing 09/20/2021 0124 by Lauretta Chester A, RN Outcome: Progressing   Problem: Coping: Goal:  Ability to verbalize frustrations and anger appropriately will improve 09/20/2021 0131 by Lauretta Chester A, RN Outcome: Progressing 09/20/2021 0124 by Lauretta Chester A, RN Outcome: Progressing Goal: Ability to demonstrate self-control will improve 09/20/2021 0131 by Lauretta Chester A, RN Outcome: Progressing 09/20/2021 0124 by Lauretta Chester A, RN Outcome: Progressing   Problem: Health Behavior/Discharge Planning: Goal: Identification of resources available to assist in meeting health care needs will improve 09/20/2021 0131 by Alfonse Ras, Kyerra Vargo A, RN Outcome: Progressing 09/20/2021 0124 by Lauretta Chester A, RN Outcome: Progressing Goal: Compliance with treatment plan for underlying cause of condition will improve 09/20/2021 0131 by Alfonse Ras, Magdalyn Arenivas A, RN Outcome: Progressing 09/20/2021 0124 by Lauretta Chester A, RN Outcome: Progressing   Problem: Physical Regulation: Goal: Ability to maintain clinical measurements within normal limits will improve 09/20/2021 0131 by Alfonse Ras, Karcyn Menn A, RN Outcome: Progressing 09/20/2021 0124 by Lauretta Chester A, RN Outcome: Progressing   Problem: Safety: Goal: Periods of time without injury will increase 09/20/2021 0131 by Alfonse Ras, Kynan Peasley A, RN Outcome: Progressing 09/20/2021 0124 by Lauretta Chester A, RN Outcome: Progressing   Problem: Education: Goal: Ability to make informed decisions regarding treatment will improve 09/20/2021 0131 by Alfonse Ras, Vicky Schleich A, RN Outcome: Progressing 09/20/2021 0124 by Lauretta Chester A, RN Outcome: Progressing    Problem: Coping: Goal: Coping ability will improve 09/20/2021 0131 by Lauretta Chester A, RN Outcome: Progressing 09/20/2021 0124 by Lauretta Chester A, RN Outcome: Progressing   Problem: Health Behavior/Discharge Planning: Goal: Identification of resources available to assist in meeting health care needs will improve 09/20/2021 0131 by Alfonse Ras, Kalei Meda A, RN Outcome: Progressing 09/20/2021 0124 by Lauretta Chester A, RN Outcome: Progressing   Problem: Medication: Goal: Compliance with prescribed medication regimen will improve 09/20/2021 0131 by Alfonse Ras, Dare Spillman A, RN Outcome: Progressing 09/20/2021 0124 by Lauretta Chester A, RN Outcome: Progressing   Problem: Self-Concept: Goal: Ability to disclose and discuss suicidal ideas will improve 09/20/2021 0131 by Alfonse Ras, Jayln Madeira A, RN Outcome: Progressing 09/20/2021 0124 by Lauretta Chester A, RN Outcome: Progressing Goal: Will verbalize positive feelings about self 09/20/2021 0131 by Lauretta Chester A, RN Outcome: Progressing 09/20/2021 0124 by Lauretta Chester A, RN Outcome: Progressing   Problem: Education: Goal: Knowledge of General Education information will improve Description: Including pain rating scale, medication(s)/side effects and non-pharmacologic comfort measures 09/20/2021 0131 by Lauretta Chester A, RN Outcome: Progressing 09/20/2021 0124 by Lauretta Chester A, RN Outcome: Progressing   Problem: Health Behavior/Discharge Planning: Goal: Ability to manage health-related needs will improve 09/20/2021 0131 by Alfonse Ras, Diantha Paxson A, RN Outcome: Progressing 09/20/2021 0124 by Lauretta Chester A, RN Outcome: Progressing   Problem: Clinical Measurements: Goal: Ability to maintain clinical measurements within normal limits will improve 09/20/2021 0131 by Alfonse Ras, Serena Petterson A, RN Outcome: Progressing 09/20/2021 0124 by Lauretta Chester A, RN Outcome: Progressing   Problem: Clinical Measurements: Goal: Will remain free from  infection 09/20/2021 0131 by Alfonse Ras, Parissa Chiao A, RN Outcome: Progressing 09/20/2021 0124 by Lauretta Chester A, RN Outcome: Progressing   Problem: Clinical Measurements: Goal: Diagnostic test results will improve 09/20/2021 0131 by Lauretta Chester A, RN Outcome: Progressing 09/20/2021 0124 by Lauretta Chester A, RN Outcome: Progressing   Problem: Clinical Measurements: Goal: Respiratory complications will improve 09/20/2021 0131 by Lauretta Chester A, RN Outcome: Progressing 09/20/2021 0124 by Lauretta Chester A, RN Outcome: Progressing   Problem: Elimination: Goal: Will not experience complications related to bowel motility 09/20/2021 0131 by Lauretta Chester A, RN Outcome: Progressing 09/20/2021 0124 by Lauretta Chester A, RN Outcome: Progressing   Problem: Elimination: Goal: Will  not experience complications related to urinary retention 09/20/2021 0131 by Lauretta Chester A, RN Outcome: Progressing 09/20/2021 0124 by Lauretta Chester A, RN Outcome: Progressing   Problem: Pain Managment: Goal: General experience of comfort will improve 09/20/2021 0131 by Lauretta Chester A, RN Outcome: Progressing 09/20/2021 0124 by Lauretta Chester A, RN Outcome: Progressing   Problem: Skin Integrity: Goal: Risk for impaired skin integrity will decrease 09/20/2021 0131 by Lauretta Chester A, RN Outcome: Progressing 09/20/2021 0124 by Lauretta Chester A, RN Outcome: Progressing   Problem: Education: Goal: Knowledge of [place] Education information/materials will improve 09/20/2021 0131 by Lauretta Chester A, RN Outcome: Progressing 09/20/2021 0124 by Lauretta Chester A, RN Outcome: Progressing   Problem: Health Behavior/Discharge Planning: Goal: Compliance with treatment plan for underlying cause of condition will improve 09/20/2021 0131 by Alfonse Ras, Latisia Hilaire A, RN Outcome: Progressing 09/20/2021 0124 by Lauretta Chester A, RN Outcome: Progressing   Problem: Physical  Regulation: Goal: Ability to maintain clinical measurements within normal limits will improve 09/20/2021 0131 by Alfonse Ras, Reannon Candella A, RN Outcome: Progressing 09/20/2021 0124 by Lauretta Chester A, RN Outcome: Progressing   Problem: Safety: Goal: Periods of time without injury will increase 09/20/2021 0131 by Alfonse Ras, Liala Codispoti A, RN Outcome: Progressing 09/20/2021 0124 by Lauretta Chester A, RN Outcome: Progressing   Problem: Medication: Goal: Compliance with prescribed medication regimen will improve 09/20/2021 0131 by Alfonse Ras, Mercie Balsley A, RN Outcome: Progressing 09/20/2021 0124 by Lauretta Chester A, RN Outcome: Progressing   Problem: Self-Concept: Goal: Ability to disclose and discuss suicidal ideas will improve 09/20/2021 0131 by Alfonse Ras, Mckay Brandt A, RN Outcome: Progressing 09/20/2021 0124 by Lauretta Chester A, RN Outcome: Progressing   Problem: Self-Concept: Goal: Will verbalize positive feelings about self 09/20/2021 0131 by Lauretta Chester A, RN Outcome: Progressing 09/20/2021 0124 by Winona Legato, RN Outcome: Progressing

## 2021-09-20 NOTE — Plan of Care (Signed)
  Problem: Activity: Goal: Interest or engagement in activities will improve Outcome: Progressing   Problem: Coping: Goal: Coping ability will improve Outcome: Progressing   Problem: Medication: Goal: Compliance with prescribed medication regimen will improve Outcome: Progressing   Problem: Self-Concept: Goal: Ability to disclose and discuss suicidal ideas will improve Outcome: Progressing

## 2021-09-21 DIAGNOSIS — F332 Major depressive disorder, recurrent severe without psychotic features: Secondary | ICD-10-CM | POA: Diagnosis not present

## 2021-09-21 LAB — CBC WITH DIFFERENTIAL/PLATELET
Abs Immature Granulocytes: 0.83 10*3/uL — ABNORMAL HIGH (ref 0.00–0.07)
Basophils Absolute: 0.1 10*3/uL (ref 0.0–0.1)
Basophils Relative: 1 %
Eosinophils Absolute: 0 10*3/uL (ref 0.0–0.5)
Eosinophils Relative: 0 %
HCT: 39.3 % (ref 39.0–52.0)
Hemoglobin: 12.4 g/dL — ABNORMAL LOW (ref 13.0–17.0)
Immature Granulocytes: 6 %
Lymphocytes Relative: 9 %
Lymphs Abs: 1.3 10*3/uL (ref 0.7–4.0)
MCH: 25.8 pg — ABNORMAL LOW (ref 26.0–34.0)
MCHC: 31.6 g/dL (ref 30.0–36.0)
MCV: 81.9 fL (ref 80.0–100.0)
Monocytes Absolute: 4 10*3/uL — ABNORMAL HIGH (ref 0.1–1.0)
Monocytes Relative: 29 %
Neutro Abs: 7.8 10*3/uL — ABNORMAL HIGH (ref 1.7–7.7)
Neutrophils Relative %: 55 %
Platelets: 90 10*3/uL — ABNORMAL LOW (ref 150–400)
RBC: 4.8 MIL/uL (ref 4.22–5.81)
RDW: 14.8 % (ref 11.5–15.5)
Smear Review: DECREASED
WBC: 14.1 10*3/uL — ABNORMAL HIGH (ref 4.0–10.5)
nRBC: 0 % (ref 0.0–0.2)

## 2021-09-21 LAB — LIPID PANEL
Cholesterol: 96 mg/dL (ref 0–200)
HDL: 25 mg/dL — ABNORMAL LOW (ref >40)
LDL Cholesterol: 52 mg/dL (ref 0–99)
Total CHOL/HDL Ratio: 3.8 RATIO
Triglycerides: 93 mg/dL (ref <150)
VLDL: 19 mg/dL (ref 0–40)

## 2021-09-21 LAB — COMPREHENSIVE METABOLIC PANEL
ALT: 13 U/L (ref 0–44)
AST: 16 U/L (ref 15–41)
Albumin: 3.5 g/dL (ref 3.5–5.0)
Alkaline Phosphatase: 84 U/L (ref 38–126)
Anion gap: 6 (ref 5–15)
BUN: 21 mg/dL (ref 8–23)
CO2: 25 mmol/L (ref 22–32)
Calcium: 8.8 mg/dL — ABNORMAL LOW (ref 8.9–10.3)
Chloride: 108 mmol/L (ref 98–111)
Creatinine, Ser: 0.76 mg/dL (ref 0.61–1.24)
GFR, Estimated: >60 mL/min (ref >60)
Glucose, Bld: 109 mg/dL — ABNORMAL HIGH (ref 70–99)
Potassium: 3.7 mmol/L (ref 3.5–5.1)
Sodium: 139 mmol/L (ref 135–145)
Total Bilirubin: 0.8 mg/dL (ref 0.3–1.2)
Total Protein: 5.6 g/dL — ABNORMAL LOW (ref 6.5–8.1)

## 2021-09-21 NOTE — Plan of Care (Signed)

## 2021-09-21 NOTE — Progress Notes (Signed)
[facility] MD Progress Note  09/21/2021 11:57 AM [name]  MRN:  [id number] Subjective: [name] is seen on rounds.  He had some constipation yesterday but it has resolved.  He has been compliant with his medications and denies any side effects.  He is eating well and sleeping well.  Talk to him about being discharged sometime this week.  I will call his wife and go from there.  His lab work was within normal limits.  His hemoglobin A1c and lipid panel were within normal limits.  Principal Problem: MDD (major depressive disorder), recurrent episode, severe ([place]) Diagnosis: Principal Problem:   MDD (major depressive disorder), recurrent episode, severe ([place])  Total Time spent with patient: 15 minutes  Past Psychiatric History: None  Past Medical History:  Past Medical History:  Diagnosis Date   Arthralgia of multiple joints    limited mobility w/  independant adl's   BPH with obstruction/lower urinary tract symptoms    Chronic idiopathic monocytosis    followed by dr Waymon Budge--  per lov note 06/ 2017 persistant unexplained  with normal bone barrow bx   Coronary atherosclerosis of unspecified type of vessel, native or graft    cardiologist-  dr Stanford Breed -- per lov note 11-19-2014 ,  cardiac cath in 2000-- pLAD 60-70%,  small intermediate branch 70-80%,  mRCA 20%,  normal LV   Degenerative arthritis of spine    cervical and lumar   Diverticulosis of colon (without mention of hemorrhage)    Dysuria    chronic   Elevated PSA    Feeling of incomplete bladder emptying    Hiatal hernia    moderate per ct 11/ 2017   History of adenomatous polyp of colon    2008   History of atrial fibrillation    remote hx episode   History of COVID-19 03/05/2020   History of pancreatitis    07-01-2013  and 10-10-2015   History of prostatitis    2014   Hx of colonic polyps    Hyperlipidemia    Hypertension    Mouth sore    roof of mouth sore    Nephrolithiasis    Nocturia more than twice per  night    severe  w/ leakage   OSA (obstructive sleep apnea)    INTOLERANT CPAP   Osteoarthritis    Pernicious anemia    B12  Def.   Peyronie disease    Renal insufficiency    Sepsis ([place]) 03/22/2017   Thrombocytopenia, unspecified ([place]) hemotology/oncologist-  dr Waymon Budge   per dr Velta Addison note 06/ 2016  secondary to vitro clumping   Vitamin B deficiency     Past Surgical History:  Procedure Laterality Date   BONE MARROW BIOPSY  2011   normal   CARDIAC CATHETERIZATION  2000   per dr Kathyrn Drown note --  dLAD 60-70%,  small intermediate branch 70-80%,  mRCA 20%,  normal LV   CARDIOVASCULAR STRESS TEST  01-29-2013   dr Stanford Breed   normal nuclear study w/ no ischemia,  normal LV function and wall motion , ef 82%   CATARACT EXTRACTION W/ INTRAOCULAR LENS  IMPLANT, BILATERAL  08/2015   CHOLECYSTECTOMY  11/24/2010   Procedure: LAPAROSCOPIC CHOLECYSTECTOMY WITH INTRAOPERATIVE CHOLANGIOGRAM;  Surgeon: Earnstine Regal, MD;  Location: [facility];  Service: General;  Laterality: N/A;  c-arm   CYSTOSCOPY W/ URETERAL STENT PLACEMENT Left 12/17/2015   Procedure: CYSTOSCOPY WITH RETROGRADE PYELOGRAM/URETERAL STENT PLACEMENT;  Surgeon: Ardis Hughs, MD;  Location: [facility];  Service: Urology;  Laterality: Left;   CYSTOSCOPY WITH RETROGRADE PYELOGRAM, URETEROSCOPY AND STENT PLACEMENT Left 12/22/2015   Procedure: CYSTOSCOPY WITH LEFT RETROGRADE  URETEROSCOPY AND STENT PLACEMENT;  Surgeon: Carolan Clines, MD;  Location: [place];  Service: Urology;  Laterality: Left;   ERCP N/A 07/04/2013   Procedure: ENDOSCOPIC RETROGRADE CHOLANGIOPANCREATOGRAPHY (ERCP);  Surgeon: Gatha Mayer, MD;  Location: [facility];  Service: Endoscopy;  Laterality: N/A;  MAC if available   ERCP N/A 10/12/2015   Procedure: ENDOSCOPIC RETROGRADE CHOLANGIOPANCREATOGRAPHY (ERCP);  Surgeon: [name], MD;  Location: [facility];  Service: Endoscopy;  Laterality: N/A;   HOLMIUM LASER APPLICATION Left  78/02/9560   Procedure: HOLMIUM LASER APPLICATION;  Surgeon: Carolan Clines, MD;  Location: [facility];  Service: Urology;  Laterality: Left;   INGUINAL HERNIA REPAIR Right 01/25/2002   KNEE ARTHROSCOPY  x5   SATURATION BIOPSY OF PROSTATE  05-29-2007  and 01-26-2008   SHOULDER ARTHROSCOPY WITH OPEN ROTATOR CUFF REPAIR AND DISTAL CLAVICLE ACROMINECTOMY Right 11/19/2004   TOTAL HIP ARTHROPLASTY Right 05/21/2009   TOTAL KNEE ARTHROPLASTY Bilateral left 05-02-2003/  right  03-23-2010   TRANSTHORACIC ECHOCARDIOGRAM  12/23/2004   ef 60%, mild MV calcification without stenosis/  mild TR,  PASP 48mHg   UVULOPALATOPHARYNGOPLASTY  1993    w/  T & A   Family History:  Family History  Problem Relation Age of Onset   Peripheral vascular disease Mother    Colon cancer Father [phone]  Breast cancer Daughter    Breast cancer Daughter     Social History:  Social History   Substance and Sexual Activity  Alcohol Use No   Alcohol/week: 0.0 standard drinks of alcohol     Social History   Substance and Sexual Activity  Drug Use No    Social History   Socioeconomic History   Marital status: Widowed    Spouse name: Not on file   Number of children: 2   Years of education: Not on file   Highest education level: Not on file  Occupational History   Occupation: retired    [employer] farm manager RETIRED  Tobacco Use   Smoking status: Former    Types: Pipe    Quit date: 01/18/1966    Years since quitting: 55.7   Smokeless tobacco: Never  Vaping Use   Vaping Use: Never used  Substance and Sexual Activity   Alcohol use: No    Alcohol/week: 0.0 standard drinks of alcohol   Drug use: No   Sexual activity: Not Currently  Other Topics Concern   Not on file  Social History Narrative   DNR   Widowed   2 daughters leave near by   Social Determinants of Health   Financial Resource Strain: Low Risk  (08/21/2021)   Overall Financial Resource Strain (CARDIA)    Difficulty of Paying Living  Expenses: Not hard at all  Food Insecurity: No Food Insecurity (08/21/2021)   Hunger Vital Sign    Worried About Running Out of Food in the Last Year: Never true    RJenningsin the Last Year: Never true  Transportation Needs: No Transportation Needs (08/21/2021)   PRAPARE - THydrologist(Medical): No    Lack of Transportation (Non-Medical): No  Physical Activity: Inactive (08/21/2021)   Exercise Vital Sign    Days of Exercise per Week: 0 days    Minutes of Exercise per Session: 0 min  Stress: Stress Concern  Present (08/21/2021)   [place]    Feeling of Stress : To some extent  Social Connections: Not on file   Additional Social History:                         Sleep: Good  Appetite:  Good  Current Medications: Current Facility-Administered Medications  Medication Dose Route Frequency Provider Last Rate Last Admin   acetaminophen (TYLENOL) tablet 650 mg  650 mg Oral Q6H PRN Starkes-Perry, Gayland Curry, FNP       albuterol (PROVENTIL) (2.5 MG/3ML) 0.083% nebulizer solution 2.5 mg  2.5 mg Nebulization Q2H PRN Starkes-Perry, Gayland Curry, FNP       alum & mag hydroxide-simeth (MAALOX/MYLANTA) 200-200-20 MG/5ML suspension 30 mL  30 mL Oral Q4H PRN Starkes-Perry, Gayland Curry, FNP       ciprofloxacin-dexamethasone (CIPRODEX) 0.3-0.1 % OTIC (EAR) suspension 4 drop  4 drop Left EAR BID Parks Ranger, DO   4 drop at 09/21/21 0926   feeding supplement (NEPRO CARB STEADY) liquid 237 mL  237 mL Oral TID BM Parks Ranger, DO   237 mL at 09/21/21 0958   finasteride (PROSCAR) tablet 5 mg  5 mg Oral Daily Suella Broad, FNP   5 mg at 09/21/21 0925   fluticasone (FLONASE) 50 MCG/ACT nasal spray 1 spray  1 spray Each Nare BID Suella Broad, FNP   1 spray at 09/21/21 0926   gabapentin (NEURONTIN) capsule 100 mg  100 mg Oral BID Suella Broad, FNP   100 mg at  09/21/21 9528   hydrocortisone cream 1 %   Topical BID Parks Ranger, DO   Given at 09/21/21 0925   loratadine (CLARITIN) tablet 10 mg  10 mg Oral Daily Suella Broad, FNP   10 mg at 09/21/21 4132   LORazepam (ATIVAN) tablet 0.5 mg  0.5 mg Oral Q6H PRN Suella Broad, FNP   0.5 mg at 09/20/21 2136   magnesium hydroxide (MILK OF MAGNESIA) suspension 30 mL  30 mL Oral Daily PRN Suella Broad, FNP       melatonin tablet 5 mg  5 mg Oral QHS Suella Broad, FNP   5 mg at 09/20/21 2135   multivitamin with minerals tablet 1 tablet  1 tablet Oral Daily Parks Ranger, DO   1 tablet at 09/21/21 0925   OLANZapine (ZYPREXA) tablet 5 mg  5 mg Oral QHS Parks Ranger, DO   5 mg at 09/20/21 2136   polyethylene glycol (MIRALAX / GLYCOLAX) packet 17 g  17 g Oral Daily PRN Starkes-Perry, Gayland Curry, FNP       polyvinyl alcohol (LIQUIFILM TEARS) 1.4 % ophthalmic solution 1 drop  1 drop Both Eyes PRN Starkes-Perry, Gayland Curry, FNP       senna-docusate (Senokot-S) tablet 1 tablet  1 tablet Oral QHS Parks Ranger, DO   1 tablet at 09/20/21 2135   sertraline (ZOLOFT) tablet 100 mg  100 mg Oral Daily Suella Broad, FNP   100 mg at 09/21/21 0925   tamsulosin (FLOMAX) capsule 0.8 mg  0.8 mg Oral Daily Suella Broad, FNP   0.8 mg at 09/21/21 0925   traZODone (DESYREL) tablet 50 mg  50 mg Oral QHS PRN Parks Ranger, DO   50 mg at 09/20/21 2136    Lab Results:  Results for orders placed or performed during the hospital encounter  of 09/17/21 (from the past 48 hour(s))  CBC with Differential/Platelet     Status: Abnormal   Collection Time: 09/21/21  6:52 AM  Result Value Ref Range   WBC 14.1 (H) 4.0 - 10.5 K/uL   RBC 4.80 4.22 - 5.81 MIL/uL   Hemoglobin 12.4 (L) 13.0 - 17.0 g/dL   HCT 39.3 39.0 - 52.0 %   MCV 81.9 80.0 - 100.0 fL   MCH 25.8 (L) 26.0 - 34.0 pg   MCHC 31.6 30.0 - 36.0 g/dL   RDW 14.8 11.5 - 15.5 %   Platelets  90 (L) 150 - 400 K/uL    Comment: Immature Platelet Fraction may be clinically indicated, consider ordering this additional test [id number] PLATELET COUNT CONFIRMED BY SMEAR    nRBC 0.0 0.0 - 0.2 %   Neutrophils Relative % 55 %   Neutro Abs 7.8 (H) 1.7 - 7.7 K/uL   Lymphocytes Relative 9 %   Lymphs Abs 1.3 0.7 - 4.0 K/uL   Monocytes Relative 29 %   Monocytes Absolute 4.0 (H) 0.1 - 1.0 K/uL   Eosinophils Relative 0 %   Eosinophils Absolute 0.0 0.0 - 0.5 K/uL   Basophils Relative 1 %   Basophils Absolute 0.1 0.0 - 0.1 K/uL   WBC Morphology MILD LEFT SHIFT (1-5% METAS, OCC MYELO, OCC BANDS)    RBC Morphology MORPHOLOGY UNREMARKABLE    Smear Review PLATELETS APPEAR DECREASED    Immature Granulocytes 6 %   Abs Immature Granulocytes 0.83 (H) 0.00 - 0.07 K/uL    Comment: Performed at [facility], [address] [place], [place] [address]  Comprehensive metabolic panel     Status: Abnormal   Collection Time: 09/21/21  6:52 AM  Result Value Ref Range   Sodium 139 135 - 145 mmol/L   Potassium 3.7 3.5 - 5.1 mmol/L   Chloride 108 98 - 111 mmol/L   CO2 25 22 - 32 mmol/L   Glucose, Bld 109 (H) 70 - 99 mg/dL    Comment: Glucose reference range applies only to samples taken after fasting for at least 8 hours.   BUN 21 8 - 23 mg/dL   Creatinine, Ser 0.76 0.61 - 1.24 mg/dL   Calcium 8.8 (L) 8.9 - 10.3 mg/dL   Total Protein 5.6 (L) 6.5 - 8.1 g/dL   Albumin 3.5 3.5 - 5.0 g/dL   AST 16 15 - 41 U/L   ALT 13 0 - 44 U/L   Alkaline Phosphatase 84 38 - 126 U/L   Total Bilirubin 0.8 0.3 - 1.2 mg/dL   GFR, Estimated >60 >60 mL/min    Comment: (NOTE) Calculated using the CKD-EPI Creatinine Equation (2021)    Anion gap 6 5 - 15    Comment: Performed at [facility], [address] [place], [place] [address]  Lipid panel     Status: Abnormal   Collection Time: 09/21/21  6:52 AM  Result Value Ref Range   Cholesterol 96 0 - 200 mg/dL   Triglycerides 93 <150 mg/dL   HDL 25 (L)  >40 mg/dL   Total CHOL/HDL Ratio 3.8 RATIO   VLDL 19 0 - 40 mg/dL   LDL Cholesterol 52 0 - 99 mg/dL    Comment:        Total Cholesterol/HDL:CHD Risk Coronary Heart Disease Risk Table                     Men   Women  1/2 Average Risk  3.4   3.3  Average Risk       5.0   4.4  2 X Average Risk   9.6   7.1  3 X Average Risk  23.4   11.0        Use the calculated Patient Ratio above and the CHD Risk Table to determine the patient's CHD Risk.        ATP III CLASSIFICATION (LDL):  <100     mg/dL   Optimal  100-129  mg/dL   Near or Above                    Optimal  130-159  mg/dL   Borderline  160-189  mg/dL   High  >190     mg/dL   Very High Performed at [facility], [address] [place], [place] [address]     Blood Alcohol level:  No results found for: "[facility]"  Metabolic Disorder Labs: Lab Results  Component Value Date   HGBA1C 5.2 07/01/2017   No results found for: "PROLACTIN" Lab Results  Component Value Date   CHOL 96 09/21/2021   TRIG 93 09/21/2021   HDL 25 (L) 09/21/2021   CHOLHDL 3.8 09/21/2021   VLDL 19 09/21/2021   LDLCALC 52 09/21/2021   LDLCALC 73 09/07/2019    Physical Findings: AIMS:  , ,  ,  ,    CIWA:    COWS:     Musculoskeletal: Strength & Muscle Tone: within normal limits Gait & Station: normal Patient leans: N/A  Psychiatric Specialty Exam:  Presentation  General Appearance: Appropriate for Environment; Casual  Eye Contact:Fair  Speech:Clear and Coherent; Normal Rate  Speech Volume:Normal  Handedness:Right   Mood and Affect  Mood:Anxious; Depressed  Affect:Congruent; Appropriate   Thought Process  Thought Processes:Coherent; Linear  Descriptions of Associations:Intact  Orientation:Full (Time, Place and Person)  Thought Content:Logical  History of Schizophrenia/Schizoaffective disorder:No data recorded Duration of Psychotic Symptoms:No data recorded Hallucinations:No data recorded Ideas of  Reference:None  Suicidal Thoughts:No data recorded Homicidal Thoughts:No data recorded  Sensorium  Memory:Immediate Fair; Recent Fair; Remote Fair  Judgment:Fair  Insight:Shallow   Executive Functions  Concentration:Fair  Attention Span:Fair  [place]   Psychomotor Activity  Psychomotor Activity:No data recorded  Assets  Assets:Communication Skills; Desire for Improvement; Financial Resources/Insurance; Housing; Leisure Time; Physical Health; Resilience; Social Support   Sleep  Sleep:No data recorded   Physical Exam: Physical Exam Vitals and nursing note reviewed.  Constitutional:      Appearance: Normal appearance. He is normal weight.  Neurological:     General: No focal deficit present.     Mental Status: He is alert and oriented to person, place, and time.  Psychiatric:        Attention and Perception: Attention and perception normal.        Mood and Affect: Mood and affect normal.        Speech: Speech normal.        Behavior: Behavior normal. Behavior is cooperative.        Thought Content: Thought content normal.        Cognition and Memory: Cognition and memory normal.        Judgment: Judgment normal.    Review of Systems  Constitutional: Negative.   HENT:  Positive for hearing loss.   Eyes: Negative.   Respiratory: Negative.    Cardiovascular: Negative.   Gastrointestinal: Negative.   Genitourinary: Negative.   Musculoskeletal:  Negative.   Skin: Negative.   Neurological: Negative.   Endo/Heme/Allergies: Negative.   Psychiatric/Behavioral: Negative.     Blood pressure (!) 125/48, pulse 65, temperature (!) 97.5 F (36.4 C), temperature source Oral, resp. rate 18, height 5' (1.524 m), weight 59 kg, SpO2 95 %. Body mass index is 25.39 kg/m.   Treatment Plan Summary: Daily contact with patient to assess and evaluate symptoms and progress in treatment, Medication management, and Plan continue  current medications.  Parks Ranger, DO 09/21/2021, 11:57 AM

## 2021-09-21 NOTE — Group Note (Signed)
[facility] LCSW Group Therapy Note    Group Date: 09/21/2021 Start Time: 1430 End Time: 1445  Type of Therapy and Topic:  Group Therapy:  Overcoming Obstacles  Participation Level:  [facility] PARTICIPATION LEVEL: Did Not Attend  Mood:  Description of Group:   In this group patients will be encouraged to explore what they see as obstacles to their own wellness and recovery. They will be guided to discuss their thoughts, feelings, and behaviors related to these obstacles. The group will process together ways to cope with barriers, with attention given to specific choices patients can make. Each patient will be challenged to identify changes they are motivated to make in order to overcome their obstacles. This group will be process-oriented, with patients participating in exploration of their own experiences as well as giving and receiving support and challenge from other group members.  Therapeutic Goals: 1. Patient will identify personal and current obstacles as they relate to admission. 2. Patient will identify barriers that currently interfere with their wellness or overcoming obstacles.  3. Patient will identify feelings, thought process and behaviors related to these barriers. 4. Patient will identify two changes they are willing to make to overcome these obstacles:    Summary of Patient Progress   Patient was asleep.  Normal rise and fall of chest noted.  CSW left handout in patient's room.   Therapeutic Modalities:   Cognitive Behavioral Therapy Solution Focused Therapy Motivational Interviewing Relapse Prevention Therapy   Rozann Lesches, LCSW

## 2021-09-21 NOTE — Progress Notes (Signed)
Dressing change to left forearm skin tear. Tolerated well

## 2021-09-21 NOTE — Progress Notes (Signed)
Patient actively engaged in leisure education group outside in the courtyard that included listening to music.

## 2021-09-21 NOTE — Progress Notes (Signed)
Patient pleasant and cooperative. Medication compliant. Appropriate with staff and peers. Denies SI, HI, AVH. VIsible in milieu. Patient able to feed and toilet self. Ambulates with walker independently.  Went outside with MHT today. No complaints or concerns voiced. Encouragement and support provided. Safety checks maintained. Meds given as prescribed. Pt receptive and remains safe on unit with q 15 min checks.

## 2021-09-22 DIAGNOSIS — F332 Major depressive disorder, recurrent severe without psychotic features: Secondary | ICD-10-CM | POA: Diagnosis not present

## 2021-09-22 LAB — HEMOGLOBIN A1C
Hgb A1c MFr Bld: 4.8 % (ref 4.8–5.6)
Mean Plasma Glucose: 91.06 mg/dL

## 2021-09-22 MED ORDER — GABAPENTIN 100 MG PO CAPS
200.0000 mg | ORAL_CAPSULE | Freq: Two times a day (BID) | ORAL | Status: DC
  Administered 2021-09-22 – 2021-09-23 (×2): 200 mg via ORAL
  Filled 2021-09-22 (×2): qty 2

## 2021-09-22 MED ORDER — MELOXICAM 7.5 MG PO TABS
7.5000 mg | ORAL_TABLET | Freq: Every day | ORAL | Status: DC
  Administered 2021-09-22 – 2021-09-23 (×2): 7.5 mg via ORAL
  Filled 2021-09-22 (×2): qty 1

## 2021-09-22 NOTE — Progress Notes (Signed)
[facility] MD Progress Note  09/22/2021 10:46 AM TYRAE ALCOSER  MRN:  [id number] Subjective: [name] is seen on rounds.  He is complaining of shoulder pain and anxiety.  I told him he is on a low-dose of Neurontin we can go up on it.  He has never been on Mobic which I think would be helpful.  He says that his daughter has power of attorney and her name is [name].  Plan on discharging soon.  In the meantime we will make some med changes.  Principal Problem: MDD (major depressive disorder), recurrent episode, severe ([place]) Diagnosis: Principal Problem:   MDD (major depressive disorder), recurrent episode, severe ([place])  Total Time spent with patient: 15 minutes  Past Psychiatric History: Recent suicide attempt  Past Medical History:  Past Medical History:  Diagnosis Date   Arthralgia of multiple joints    limited mobility w/  independant adl's   BPH with obstruction/lower urinary tract symptoms    Chronic idiopathic monocytosis    followed by dr Waymon Budge--  per lov note 06/ 2017 persistant unexplained  with normal bone barrow bx   Coronary atherosclerosis of unspecified type of vessel, native or graft    cardiologist-  dr Stanford Breed -- per lov note 11-19-2014 ,  cardiac cath in 2000-- pLAD 60-70%,  small intermediate branch 70-80%,  mRCA 20%,  normal LV   Degenerative arthritis of spine    cervical and lumar   Diverticulosis of colon (without mention of hemorrhage)    Dysuria    chronic   Elevated PSA    Feeling of incomplete bladder emptying    Hiatal hernia    moderate per ct 11/ 2017   History of adenomatous polyp of colon    2008   History of atrial fibrillation    remote hx episode   History of COVID-19 03/05/2020   History of pancreatitis    07-01-2013  and 10-10-2015   History of prostatitis    2014   Hx of colonic polyps    Hyperlipidemia    Hypertension    Mouth sore    roof of mouth sore    Nephrolithiasis    Nocturia more than twice per night    severe  w/ leakage    OSA (obstructive sleep apnea)    INTOLERANT CPAP   Osteoarthritis    Pernicious anemia    B12  Def.   Peyronie disease    Renal insufficiency    Sepsis ([place]) 03/22/2017   Thrombocytopenia, unspecified ([place]) hemotology/oncologist-  dr Waymon Budge   per dr Velta Addison note 06/ 2016  secondary to vitro clumping   Vitamin B deficiency     Past Surgical History:  Procedure Laterality Date   BONE MARROW BIOPSY  2011   normal   CARDIAC CATHETERIZATION  2000   per dr Kathyrn Drown note --  dLAD 60-70%,  small intermediate branch 70-80%,  mRCA 20%,  normal LV   CARDIOVASCULAR STRESS TEST  01-29-2013   dr Stanford Breed   normal nuclear study w/ no ischemia,  normal LV function and wall motion , ef 82%   CATARACT EXTRACTION W/ INTRAOCULAR LENS  IMPLANT, BILATERAL  08/2015   CHOLECYSTECTOMY  11/24/2010   Procedure: LAPAROSCOPIC CHOLECYSTECTOMY WITH INTRAOPERATIVE CHOLANGIOGRAM;  Surgeon: Earnstine Regal, MD;  Location: [facility];  Service: General;  Laterality: N/A;  c-arm   CYSTOSCOPY W/ URETERAL STENT PLACEMENT Left 12/17/2015   Procedure: CYSTOSCOPY WITH RETROGRADE PYELOGRAM/URETERAL STENT PLACEMENT;  Surgeon: Ardis Hughs, MD;  Location:  [facility];  Service: Urology;  Laterality: Left;   CYSTOSCOPY WITH RETROGRADE PYELOGRAM, URETEROSCOPY AND STENT PLACEMENT Left 12/22/2015   Procedure: CYSTOSCOPY WITH LEFT RETROGRADE  URETEROSCOPY AND STENT PLACEMENT;  Surgeon: Carolan Clines, MD;  Location: [facility];  Service: Urology;  Laterality: Left;   ERCP N/A 07/04/2013   Procedure: ENDOSCOPIC RETROGRADE CHOLANGIOPANCREATOGRAPHY (ERCP);  Surgeon: Gatha Mayer, MD;  Location: [facility];  Service: Endoscopy;  Laterality: N/A;  MAC if available   ERCP N/A 10/12/2015   Procedure: ENDOSCOPIC RETROGRADE CHOLANGIOPANCREATOGRAPHY (ERCP);  Surgeon: [name], MD;  Location: [facility];  Service: Endoscopy;  Laterality: N/A;   HOLMIUM LASER APPLICATION Left 16/01/958   Procedure: HOLMIUM  LASER APPLICATION;  Surgeon: Carolan Clines, MD;  Location: [facility];  Service: Urology;  Laterality: Left;   INGUINAL HERNIA REPAIR Right 01/25/2002   KNEE ARTHROSCOPY  x5   SATURATION BIOPSY OF PROSTATE  05-29-2007  and 01-26-2008   SHOULDER ARTHROSCOPY WITH OPEN ROTATOR CUFF REPAIR AND DISTAL CLAVICLE ACROMINECTOMY Right 11/19/2004   TOTAL HIP ARTHROPLASTY Right 05/21/2009   TOTAL KNEE ARTHROPLASTY Bilateral left 05-02-2003/  right  03-23-2010   TRANSTHORACIC ECHOCARDIOGRAM  12/23/2004   ef 60%, mild MV calcification without stenosis/  mild TR,  PASP 39mHg   UVULOPALATOPHARYNGOPLASTY  1993    w/  T & A   Family History:  Family History  Problem Relation Age of Onset   Peripheral vascular disease Mother    Colon cancer Father [phone]  Breast cancer Daughter    Breast cancer Daughter     Social History:  Social History   Substance and Sexual Activity  Alcohol Use No   Alcohol/week: 0.0 standard drinks of alcohol     Social History   Substance and Sexual Activity  Drug Use No    Social History   Socioeconomic History   Marital status: Widowed    Spouse name: Not on file   Number of children: 2   Years of education: Not on file   Highest education level: Not on file  Occupational History   Occupation: retired    [employer] farm manager RETIRED  Tobacco Use   Smoking status: Former    Types: Pipe    Quit date: 01/18/1966    Years since quitting: 55.7   Smokeless tobacco: Never  Vaping Use   Vaping Use: Never used  Substance and Sexual Activity   Alcohol use: No    Alcohol/week: 0.0 standard drinks of alcohol   Drug use: No   Sexual activity: Not Currently  Other Topics Concern   Not on file  Social History Narrative   DNR   Widowed   2 daughters leave near by   Social Determinants of Health   Financial Resource Strain: Low Risk  (08/21/2021)   Overall Financial Resource Strain (CARDIA)    Difficulty of Paying Living Expenses: Not hard at all  Food  Insecurity: No Food Insecurity (08/21/2021)   Hunger Vital Sign    Worried About Running Out of Food in the Last Year: Never true    [place] the Last Year: Never true  Transportation Needs: No Transportation Needs (08/21/2021)   PRAPARE - THydrologist(Medical): No    Lack of Transportation (Non-Medical): No  Physical Activity: Inactive (08/21/2021)   Exercise Vital Sign    Days of Exercise per Week: 0 days    Minutes of Exercise per Session: 0 min  Stress: Stress  Concern Present (08/21/2021)   [place]    Feeling of Stress : To some extent  Social Connections: Not on file   Additional Social History:                         Sleep: Good  Appetite:  Good  Current Medications: Current Facility-Administered Medications  Medication Dose Route Frequency Provider Last Rate Last Admin   acetaminophen (TYLENOL) tablet 650 mg  650 mg Oral Q6H PRN Starkes-Perry, Gayland Curry, FNP       albuterol (PROVENTIL) (2.5 MG/3ML) 0.083% nebulizer solution 2.5 mg  2.5 mg Nebulization Q2H PRN Starkes-Perry, Gayland Curry, FNP       alum & mag hydroxide-simeth (MAALOX/MYLANTA) 200-200-20 MG/5ML suspension 30 mL  30 mL Oral Q4H PRN Starkes-Perry, Gayland Curry, FNP       ciprofloxacin-dexamethasone (CIPRODEX) 0.3-0.1 % OTIC (EAR) suspension 4 drop  4 drop Left EAR BID Parks Ranger, DO   4 drop at 09/22/21 1031   feeding supplement (NEPRO CARB STEADY) liquid 237 mL  237 mL Oral TID BM Parks Ranger, DO   237 mL at 09/22/21 1031   finasteride (PROSCAR) tablet 5 mg  5 mg Oral Daily Suella Broad, FNP   5 mg at 09/22/21 1031   fluticasone (FLONASE) 50 MCG/ACT nasal spray 1 spray  1 spray Each Nare BID Suella Broad, FNP   1 spray at 09/22/21 1031   gabapentin (NEURONTIN) capsule 200 mg  200 mg Oral BID Parks Ranger, DO       hydrocortisone cream 1 %   Topical BID  Parks Ranger, DO   Given at 09/22/21 1032   loratadine (CLARITIN) tablet 10 mg  10 mg Oral Daily Suella Broad, FNP   10 mg at 09/22/21 1032   LORazepam (ATIVAN) tablet 0.5 mg  0.5 mg Oral Q6H PRN Suella Broad, FNP   0.5 mg at 09/20/21 2136   magnesium hydroxide (MILK OF MAGNESIA) suspension 30 mL  30 mL Oral Daily PRN Suella Broad, FNP       melatonin tablet 5 mg  5 mg Oral QHS Suella Broad, FNP   5 mg at 09/20/21 2135   meloxicam (MOBIC) tablet 7.5 mg  7.5 mg Oral QPC breakfast Parks Ranger, DO       multivitamin with minerals tablet 1 tablet  1 tablet Oral Daily Parks Ranger, DO   1 tablet at 09/22/21 1033   OLANZapine (ZYPREXA) tablet 5 mg  5 mg Oral QHS Parks Ranger, DO   5 mg at 09/21/21 2315   polyethylene glycol (MIRALAX / GLYCOLAX) packet 17 g  17 g Oral Daily PRN Starkes-Perry, Gayland Curry, FNP       polyvinyl alcohol (LIQUIFILM TEARS) 1.4 % ophthalmic solution 1 drop  1 drop Both Eyes PRN Starkes-Perry, Gayland Curry, FNP       senna-docusate (Senokot-S) tablet 1 tablet  1 tablet Oral QHS Parks Ranger, DO   1 tablet at 09/21/21 2315   sertraline (ZOLOFT) tablet 100 mg  100 mg Oral Daily Suella Broad, FNP   100 mg at 09/22/21 1033   tamsulosin (FLOMAX) capsule 0.8 mg  0.8 mg Oral Daily Suella Broad, FNP   0.8 mg at 09/22/21 1033   traZODone (DESYREL) tablet 50 mg  50 mg Oral QHS PRN Parks Ranger, DO   50 mg  at 09/20/21 2136    Lab Results:  Results for orders placed or performed during the hospital encounter of 09/17/21 (from the past 48 hour(s))  CBC with Differential/Platelet     Status: Abnormal   Collection Time: 09/21/21  6:52 AM  Result Value Ref Range   WBC 14.1 (H) 4.0 - 10.5 K/uL   RBC 4.80 4.22 - 5.81 MIL/uL   Hemoglobin 12.4 (L) 13.0 - 17.0 g/dL   HCT 39.3 39.0 - 52.0 %   MCV 81.9 80.0 - 100.0 fL   MCH 25.8 (L) 26.0 - 34.0 pg   MCHC 31.6 30.0 - 36.0 g/dL    RDW 14.8 11.5 - 15.5 %   Platelets 90 (L) 150 - 400 K/uL    Comment: Immature Platelet Fraction may be clinically indicated, consider ordering this additional test [id number] PLATELET COUNT CONFIRMED BY SMEAR    nRBC 0.0 0.0 - 0.2 %   Neutrophils Relative % 55 %   Neutro Abs 7.8 (H) 1.7 - 7.7 K/uL   Lymphocytes Relative 9 %   Lymphs Abs 1.3 0.7 - 4.0 K/uL   Monocytes Relative 29 %   Monocytes Absolute 4.0 (H) 0.1 - 1.0 K/uL   Eosinophils Relative 0 %   Eosinophils Absolute 0.0 0.0 - 0.5 K/uL   Basophils Relative 1 %   Basophils Absolute 0.1 0.0 - 0.1 K/uL   WBC Morphology MILD LEFT SHIFT (1-5% METAS, OCC MYELO, OCC BANDS)    RBC Morphology MORPHOLOGY UNREMARKABLE    Smear Review PLATELETS APPEAR DECREASED    Immature Granulocytes 6 %   Abs Immature Granulocytes 0.83 (H) 0.00 - 0.07 K/uL    Comment: Performed at [facility], [address] [place], [place] [address]  Comprehensive metabolic panel     Status: Abnormal   Collection Time: 09/21/21  6:52 AM  Result Value Ref Range   Sodium 139 135 - 145 mmol/L   Potassium 3.7 3.5 - 5.1 mmol/L   Chloride 108 98 - 111 mmol/L   CO2 25 22 - 32 mmol/L   Glucose, Bld 109 (H) 70 - 99 mg/dL    Comment: Glucose reference range applies only to samples taken after fasting for at least 8 hours.   BUN 21 8 - 23 mg/dL   Creatinine, Ser 0.76 0.61 - 1.24 mg/dL   Calcium 8.8 (L) 8.9 - 10.3 mg/dL   Total Protein 5.6 (L) 6.5 - 8.1 g/dL   Albumin 3.5 3.5 - 5.0 g/dL   AST 16 15 - 41 U/L   ALT 13 0 - 44 U/L   Alkaline Phosphatase 84 38 - 126 U/L   Total Bilirubin 0.8 0.3 - 1.2 mg/dL   GFR, Estimated >60 >60 mL/min    Comment: (NOTE) Calculated using the CKD-EPI Creatinine Equation (2021)    Anion gap 6 5 - 15    Comment: Performed at [facility], [address] [place], [place] [address]  Lipid panel     Status: Abnormal   Collection Time: 09/21/21  6:52 AM  Result Value Ref Range   Cholesterol 96 0 - 200 mg/dL    Triglycerides 93 <150 mg/dL   HDL 25 (L) >40 mg/dL   Total CHOL/HDL Ratio 3.8 RATIO   VLDL 19 0 - 40 mg/dL   LDL Cholesterol 52 0 - 99 mg/dL    Comment:        Total Cholesterol/HDL:CHD Risk Coronary Heart Disease Risk Table  Men   Women  1/2 Average Risk   3.4   3.3  Average Risk       5.0   4.4  2 X Average Risk   9.6   7.1  3 X Average Risk  23.4   11.0        Use the calculated Patient Ratio above and the CHD Risk Table to determine the patient's CHD Risk.        ATP III CLASSIFICATION (LDL):  <100     mg/dL   Optimal  100-129  mg/dL   Near or Above                    Optimal  130-159  mg/dL   Borderline  160-189  mg/dL   High  >190     mg/dL   Very High Performed at [facility], [address] [place], [place] [address]   Hemoglobin A1c     Status: None   Collection Time: 09/21/21  6:52 AM  Result Value Ref Range   Hgb A1c MFr Bld 4.8 4.8 - 5.6 %    Comment: (NOTE) Pre diabetes:          5.7%-6.4%  Diabetes:              >6.4%  Glycemic control for   <7.0% adults with diabetes    Mean Plasma Glucose 91.06 mg/dL    Comment: Performed at [facility] [address], [place], [place] [address]    Blood Alcohol level:  No results found for: "ETH"  Metabolic Disorder Labs: Lab Results  Component Value Date   HGBA1C 4.8 09/21/2021   MPG 91.06 09/21/2021   No results found for: "PROLACTIN" Lab Results  Component Value Date   CHOL 96 09/21/2021   TRIG 93 09/21/2021   HDL 25 (L) 09/21/2021   CHOLHDL 3.8 09/21/2021   VLDL 19 09/21/2021   LDLCALC 52 09/21/2021   LDLCALC 73 09/07/2019    Physical Findings: AIMS:  , ,  ,  ,    CIWA:    COWS:     Musculoskeletal: Strength & Muscle Tone: within normal limits Gait & Station: normal Patient leans: N/A  Psychiatric Specialty Exam:  Presentation  General Appearance: Appropriate for Environment; Casual  Eye Contact:Fair  Speech:Clear and Coherent; Normal  Rate  Speech Volume:Normal  Handedness:Right   Mood and Affect  Mood:Anxious; Depressed  Affect:Congruent; Appropriate   Thought Process  Thought Processes:Coherent; Linear  Descriptions of Associations:Intact  Orientation:Full (Time, Place and Person)  Thought Content:Logical  History of Schizophrenia/Schizoaffective disorder:No data recorded Duration of Psychotic Symptoms:No data recorded Hallucinations:No data recorded Ideas of Reference:None  Suicidal Thoughts:No data recorded Homicidal Thoughts:No data recorded  Sensorium  Memory:Immediate Fair; Recent Fair; Remote Fair  Judgment:Fair  Insight:Shallow   Executive Functions  Concentration:Fair  Attention Span:Fair  [place]   Psychomotor Activity  Psychomotor Activity:No data recorded  Assets  Assets:Communication Skills; Desire for Improvement; Financial Resources/Insurance; Housing; Leisure Time; Physical Health; Resilience; Social Support   Sleep  Sleep:No data recorded   Physical Exam: Physical Exam Vitals and nursing note reviewed.  Constitutional:      Appearance: Normal appearance. He is normal weight.  Neurological:     General: No focal deficit present.     Mental Status: He is alert and oriented to person, place, and time.  Psychiatric:        Attention and Perception: Attention and  perception normal.        Mood and Affect: Mood and affect normal.        Speech: Speech normal.        Behavior: Behavior normal. Behavior is cooperative.        Thought Content: Thought content normal.        Cognition and Memory: Cognition and memory normal.        Judgment: Judgment normal.    Review of Systems  Musculoskeletal:  Positive for joint pain.  Psychiatric/Behavioral:  The patient is nervous/anxious.    Blood pressure (!) 110/43, pulse 74, temperature 98.2 F (36.8 C), temperature source Oral, resp. rate 18, height 5' (1.524 m), weight  59 kg, SpO2 97 %. Body mass index is 25.39 kg/m.   Treatment Plan Summary: Daily contact with patient to assess and evaluate symptoms and progress in treatment, Medication management, and Plan increase Neurontin to 200 mg twice a day and start Mobic 7.5 mg daily.  Parks Ranger, DO 09/22/2021, 10:46 AM

## 2021-09-22 NOTE — Progress Notes (Signed)
Patient has been cooperative with treatment. He was up for meals, patient is being considered for discharge.

## 2021-09-22 NOTE — Group Note (Signed)
[facility] LCSW Group Therapy Note    Group Date: 09/22/2021 Start Time: 2119 End Time: 1445  Type of Therapy and Topic:  Group Therapy:  Overcoming Obstacles  Participation Level:  [facility] PARTICIPATION LEVEL: Did Not Attend  Mood:  Description of Group:   In this group patients will be encouraged to explore what they see as obstacles to their own wellness and recovery. They will be guided to discuss their thoughts, feelings, and behaviors related to these obstacles. The group will process together ways to cope with barriers, with attention given to specific choices patients can make. Each patient will be challenged to identify changes they are motivated to make in order to overcome their obstacles. This group will be process-oriented, with patients participating in exploration of their own experiences as well as giving and receiving support and challenge from other group members.  Therapeutic Goals: 1. Patient will identify personal and current obstacles as they relate to admission. 2. Patient will identify barriers that currently interfere with their wellness or overcoming obstacles.  3. Patient will identify feelings, thought process and behaviors related to these barriers. 4. Patient will identify two changes they are willing to make to overcome these obstacles:    Summary of Patient Progress   X   Therapeutic Modalities:   Cognitive Behavioral Therapy Solution Focused Therapy Motivational Interviewing Relapse Prevention Therapy   Rozann Lesches, LCSW

## 2021-09-23 DIAGNOSIS — F332 Major depressive disorder, recurrent severe without psychotic features: Secondary | ICD-10-CM | POA: Diagnosis not present

## 2021-09-23 MED ORDER — SERTRALINE HCL 100 MG PO TABS: 100.0000 mg | ORAL_TABLET | Freq: Every day | ORAL | 3 refills | Status: AC

## 2021-09-23 MED ORDER — SENNOSIDES-DOCUSATE SODIUM 8.6-50 MG PO TABS: 1.0000 | ORAL_TABLET | Freq: Every day | ORAL | 3 refills | Status: DC

## 2021-09-23 MED ORDER — MELOXICAM 7.5 MG PO TABS: 7.5000 mg | ORAL_TABLET | Freq: Every day | ORAL | 3 refills | Status: DC

## 2021-09-23 MED ORDER — GABAPENTIN 100 MG PO CAPS: 200.0000 mg | ORAL_CAPSULE | Freq: Two times a day (BID) | ORAL | 3 refills | Status: AC

## 2021-09-23 MED ORDER — TRAZODONE HCL 50 MG PO TABS
50.0000 mg | ORAL_TABLET | Freq: Every evening | ORAL | 3 refills | Status: DC | PRN
Start: 2021-09-23 — End: 2021-10-02

## 2021-09-23 MED ORDER — OLANZAPINE 5 MG PO TABS: 5.0000 mg | ORAL_TABLET | Freq: Every day | ORAL | 3 refills | Status: DC

## 2021-09-23 NOTE — BH IP Treatment Plan (Signed)
Interdisciplinary Treatment and Diagnostic Plan Update  09/23/2021 Time of Session: 8:30AM ARLIS YALE MRN: [id number]  Principal Diagnosis: MDD (major depressive disorder), recurrent episode, severe ([place])  Secondary Diagnoses: Principal Problem:   MDD (major depressive disorder), recurrent episode, severe ([place])   Current Medications:  Current Facility-Administered Medications  Medication Dose Route Frequency Provider Last Rate Last Admin   acetaminophen (TYLENOL) tablet 650 mg  650 mg Oral Q6H PRN Starkes-Perry, Gayland Curry, FNP       albuterol (PROVENTIL) (2.5 MG/3ML) 0.083% nebulizer solution 2.5 mg  2.5 mg Nebulization Q2H PRN Starkes-Perry, Gayland Curry, FNP       alum & mag hydroxide-simeth (MAALOX/MYLANTA) 200-200-20 MG/5ML suspension 30 mL  30 mL Oral Q4H PRN Starkes-Perry, Gayland Curry, FNP       ciprofloxacin-dexamethasone (CIPRODEX) 0.3-0.1 % OTIC (EAR) suspension 4 drop  4 drop Left EAR BID Parks Ranger, DO   4 drop at 09/23/21 0915   feeding supplement (NEPRO CARB STEADY) liquid 237 mL  237 mL Oral TID BM Parks Ranger, DO   237 mL at 09/23/21 0916   finasteride (PROSCAR) tablet 5 mg  5 mg Oral Daily Suella Broad, FNP   5 mg at 09/23/21 0914   fluticasone (FLONASE) 50 MCG/ACT nasal spray 1 spray  1 spray Each Nare BID Suella Broad, FNP   1 spray at 09/23/21 0915   gabapentin (NEURONTIN) capsule 200 mg  200 mg Oral BID Parks Ranger, DO   200 mg at 09/23/21 0913   hydrocortisone cream 1 %   Topical BID Parks Ranger, DO   1 Application at 43/15/40 0916   loratadine (CLARITIN) tablet 10 mg  10 mg Oral Daily Suella Broad, FNP   10 mg at 09/23/21 0914   LORazepam (ATIVAN) tablet 0.5 mg  0.5 mg Oral Q6H PRN Suella Broad, FNP   0.5 mg at 09/20/21 2136   magnesium hydroxide (MILK OF MAGNESIA) suspension 30 mL  30 mL Oral Daily PRN Suella Broad, FNP       melatonin tablet 5 mg  5 mg Oral QHS Suella Broad, FNP   5 mg at 09/22/21 2121   meloxicam (MOBIC) tablet 7.5 mg  7.5 mg Oral QPC breakfast Parks Ranger, DO   7.5 mg at 09/23/21 0867   multivitamin with minerals tablet 1 tablet  1 tablet Oral Daily Parks Ranger, DO   1 tablet at 09/23/21 0913   OLANZapine (ZYPREXA) tablet 5 mg  5 mg Oral QHS Parks Ranger, DO   5 mg at 09/22/21 2121   polyethylene glycol (MIRALAX / GLYCOLAX) packet 17 g  17 g Oral Daily PRN Starkes-Perry, Gayland Curry, FNP       polyvinyl alcohol (LIQUIFILM TEARS) 1.4 % ophthalmic solution 1 drop  1 drop Both Eyes PRN Starkes-Perry, Gayland Curry, FNP       senna-docusate (Senokot-S) tablet 1 tablet  1 tablet Oral QHS Parks Ranger, DO   1 tablet at 09/22/21 2121   sertraline (ZOLOFT) tablet 100 mg  100 mg Oral Daily Suella Broad, FNP   100 mg at 09/23/21 0913   tamsulosin (FLOMAX) capsule 0.8 mg  0.8 mg Oral Daily Suella Broad, FNP   0.8 mg at 09/23/21 0914   traZODone (DESYREL) tablet 50 mg  50 mg Oral QHS PRN Parks Ranger, DO   50 mg at 09/20/21 2136   PTA Medications: Medications Prior to Admission  Medication  Sig Dispense Refill Last Dose   acetaminophen (TYLENOL) 500 MG tablet Take 1,000 mg by mouth in the morning and at bedtime.      Artificial Tear Solution (TEARS NATURALE OP) Place 1-2 drops into both eyes at bedtime as needed (dry eyes).      diclofenac Sodium (VOLTAREN ARTHRITIS PAIN) 1 % GEL Apply 2 g topically 3 (three) times daily as needed. (Patient taking differently: Apply 2 g topically 3 (three) times daily as needed (left shoulder and arm pain).) 100 g 3    Ensure (ENSURE) Take 237 mLs by mouth daily. Chocolate      finasteride (PROSCAR) 5 MG tablet Take 1 tablet (5 mg total) by mouth daily.      fluticasone (FLONASE) 50 MCG/ACT nasal spray Place 1 spray into both nostrils 2 (two) times daily. 48 g 3    gabapentin (NEURONTIN) 100 MG capsule Take 1 capsule (100 mg total) by mouth 2 (two)  times daily.      GEMTESA 75 MG TABS Take 1 tablet by mouth daily.      loratadine (CLARITIN) 10 MG tablet Take 1 tablet (10 mg total) by mouth daily. For allergies 90 tablet 3    sertraline (ZOLOFT) 100 MG tablet Take 1 tablet (100 mg total) by mouth daily.      tamsulosin (FLOMAX) 0.4 MG CAPS capsule Take 2 capsules (0.8 mg total) by mouth daily. For urine flow (Patient taking differently: Take 0.8 mg by mouth daily.) 180 capsule 0    traZODone (DESYREL) 50 MG tablet Take 1 tablet (50 mg total) by mouth at bedtime.       Patient Stressors:    Patient Strengths:    Treatment Modalities: Medication Management, Group therapy, Case management,  1 to 1 session with clinician, Psychoeducation, Recreational therapy.   Physician Treatment Plan for Primary Diagnosis: MDD (major depressive disorder), recurrent episode, severe ([place]) Long Term Goal(s): Improvement in symptoms so as ready for discharge   Short Term Goals: Ability to identify changes in lifestyle to reduce recurrence of condition will improve Ability to verbalize feelings will improve Ability to disclose and discuss suicidal ideas Ability to demonstrate self-control will improve Ability to identify and develop effective coping behaviors will improve Ability to maintain clinical measurements within normal limits will improve Compliance with prescribed medications will improve Ability to identify triggers associated with substance abuse/mental health issues will improve  Medication Management: Evaluate patient's response, side effects, and tolerance of medication regimen.  Therapeutic Interventions: 1 to 1 sessions, Unit Group sessions and Medication administration.  Evaluation of Outcomes: Adequate for Discharge  Physician Treatment Plan for Secondary Diagnosis: Principal Problem:   MDD (major depressive disorder), recurrent episode, severe ([place])  Long Term Goal(s): Improvement in symptoms so as ready for discharge   Short  Term Goals: Ability to identify changes in lifestyle to reduce recurrence of condition will improve Ability to verbalize feelings will improve Ability to disclose and discuss suicidal ideas Ability to demonstrate self-control will improve Ability to identify and develop effective coping behaviors will improve Ability to maintain clinical measurements within normal limits will improve Compliance with prescribed medications will improve Ability to identify triggers associated with substance abuse/mental health issues will improve     Medication Management: Evaluate patient's response, side effects, and tolerance of medication regimen.  Therapeutic Interventions: 1 to 1 sessions, Unit Group sessions and Medication administration.  Evaluation of Outcomes: Adequate for Discharge   RN Treatment Plan for Primary Diagnosis: MDD (major depressive disorder), recurrent  episode, severe ([place]) Long Term Goal(s): Knowledge of disease and therapeutic regimen to maintain health will improve  Short Term Goals: Ability to demonstrate self-control, Ability to participate in decision making will improve, Ability to verbalize feelings will improve, Ability to disclose and discuss suicidal ideas, Ability to identify and develop effective coping behaviors will improve, and Compliance with prescribed medications will improve  Medication Management: RN will administer medications as ordered by provider, will assess and evaluate patient's response and provide education to patient for prescribed medication. RN will report any adverse and/or side effects to prescribing provider.  Therapeutic Interventions: 1 on 1 counseling sessions, Psychoeducation, Medication administration, Evaluate responses to treatment, Monitor vital signs and CBGs as ordered, Perform/monitor CIWA, COWS, AIMS and Fall Risk screenings as ordered, Perform wound care treatments as ordered.  Evaluation of Outcomes: Adequate for Discharge   LCSW  Treatment Plan for Primary Diagnosis: MDD (major depressive disorder), recurrent episode, severe ([place]) Long Term Goal(s): Safe transition to appropriate next level of care at discharge, Engage patient in therapeutic group addressing interpersonal concerns.  Short Term Goals: Engage patient in aftercare planning with referrals and resources, Increase social support, Increase ability to appropriately verbalize feelings, Increase emotional regulation, Facilitate acceptance of mental health diagnosis and concerns, and Increase skills for wellness and recovery  Therapeutic Interventions: Assess for all discharge needs, 1 to 1 time with Social worker, Explore available resources and support systems, Assess for adequacy in community support network, Educate family and significant other(s) on suicide prevention, Complete Psychosocial Assessment, Interpersonal group therapy.  Evaluation of Outcomes: Adequate for Discharge   Progress in Treatment: Attending groups: No. Participating in groups: No. Taking medication as prescribed: Yes. Toleration medication: Yes. Family/Significant other contact made: Yes, individual(s) contacted:  SPE completed with the patient's daughter. Patient understands diagnosis: Yes. Discussing patient identified problems/goals with staff: Yes. Medical problems stabilized or resolved: Yes. Denies suicidal/homicidal ideation: Yes. Issues/concerns per patient self-inventory: No. Other: none  New problem(s) identified: No, Describe:  none  Update 09/23/2021:  No changes at this time.    New Short Term/Long Term Goal(s):  medication management for mood stabilization; elimination of SI thoughts; development of comprehensive mental wellness plan. Update 09/23/2021:  No changes at this time.   Patient Goals:  "just to get out" Update 09/23/2021:  No changes at this time.   Discharge Plan or Barriers:  Update 09/23/2021:  Patient reports plans to return to his home. He reports that he  does not think he needs follow up and declined for this CSW to schedule aftercare for him.  CSW has provided the patient with walk in information and the contact information for several agencies that may accept the patient's insurance.    Reason for Continuation of Hospitalization: Anxiety Depression Medical Issues Medication stabilization Suicidal ideation   Estimated Length of Stay:  1-7 days  Last 3 [place] Suicide Severity Risk Score: [place] Admission (Current) from 09/17/2021 in [facility] ED to Hosp-Admission (Discharged) from 09/04/2021 in [facility] ED from 08/28/2021 in [facility] No Risk High Risk No Risk       Last PHQ 2/9 Scores:    08/21/2021    1:43 PM 06/25/2021   10:51 AM 03/17/2021    9:24 AM  Depression screen PHQ 2/9  Decreased Interest 0 3 0  Down, Depressed, Hopeless '3 3 3  '$ PHQ - 2 Score '3 6 3  '$ Altered sleeping  $'3 3 3  'K$ Tired, decreased energy '3 3 3  '$ Change in appetite 3 3 0  Feeling bad or failure about yourself  '3 3 3  '$ Trouble concentrating 0 0 0  Moving slowly or fidgety/restless 0 2 0  Suicidal thoughts 0 3 0  PHQ-9 Score '15 23 12  '$ Difficult doing work/chores Extremely dIfficult Extremely dIfficult Somewhat difficult    Scribe for Treatment Team: Rozann Lesches, Marlinda Mike 09/23/2021 12:51 PM

## 2021-09-23 NOTE — Discharge Summary (Signed)
Physician Discharge Summary Note  Patient:  [name] is an 88 y.o., male MRN:  [id number] DOB:  14-Sep-1933 Patient phone:  [phone] (home)  Patient address:   [address] [place] [address] [place] [address],  Total Time spent with patient: 1 hour  Date of Admission:  09/17/2021 Date of Discharge: 09/23/2021  Reason for Admission:  Mr. Depree is a 88 y.o. male with past medical history significant for HTN, HLD, CAD, history of choledocholithiasis, history of laparoscopic cholecystectomy, BPH, overactive bladder, OSA not on CPAP, cognitive impairment, Anxiety/depression, Leukemia who presented to [facility] ED on 8/18 via EMS after patient was found with increased lethargy and confusion by his daughter.    On evaluation the patient reported: Patient states that he feels better.  States that he is  no longer hallucinating and his mind seems to be returning back to normal. Patient is very circumstantial in his thoughts today as he talks about his past life, wife, suicide attempt and dog.  He tells me he is eating and sleeping without difficulty.  He is also tolerating his medications without adverse reactions and or side effects.  He does appear to be future oriented and seeking inpatient psychiatric services, and now appears remorseful for his suicide attempt.  He is able to verbalize his wrongdoing and understand the impact it has on others.  Although he does continue to state he wishes at times he was successful in his attempt " I am almost 87 years old.  I have my grave site.  I have my tombstone there.  My wife is there.  I just need to be on the other side of her.  I did not mean to hurt anybody, and I wish my daughter would have just showed up a little later. "  At this time patient denies suicidal/self harming thoughts and psychosis.   All questions, comments, concerns, have been addressed and both verbalized understanding.  Both daughters [name] and [name] are at the bedside, have been updated  regarding ongoing search for inpatient geriatric psych hospital.  Daughter [name] does not agree with this decision, and is refusing to allow him to go out of system.  She is aware that her father is currently under involuntary commitment for these reasons listed above.  Principal Problem: MDD (major depressive disorder), recurrent episode, severe ([place]) Discharge Diagnoses: Principal Problem:   MDD (major depressive disorder), recurrent episode, severe ([place])   Past Psychiatric History: None  Past Medical History:  Past Medical History:  Diagnosis Date   Arthralgia of multiple joints    limited mobility w/  independant adl's   BPH with obstruction/lower urinary tract symptoms    Chronic idiopathic monocytosis    followed by dr Waymon Budge--  per lov note 06/ 2017 persistant unexplained  with normal bone barrow bx   Coronary atherosclerosis of unspecified type of vessel, native or graft    cardiologist-  dr Stanford Breed -- per lov note 11-19-2014 ,  cardiac cath in 2000-- pLAD 60-70%,  small intermediate branch 70-80%,  mRCA 20%,  normal LV   Degenerative arthritis of spine    cervical and lumar   Diverticulosis of colon (without mention of hemorrhage)    Dysuria    chronic   Elevated PSA    Feeling of incomplete bladder emptying    Hiatal hernia    moderate per ct 11/ 2017   History of adenomatous polyp of colon    2008   History of atrial fibrillation    remote hx  episode   History of COVID-19 03/05/2020   History of pancreatitis    07-01-2013  and 10-10-2015   History of prostatitis    2014   Hx of colonic polyps    Hyperlipidemia    Hypertension    Mouth sore    roof of mouth sore    Nephrolithiasis    Nocturia more than twice per night    severe  w/ leakage   OSA (obstructive sleep apnea)    INTOLERANT CPAP   Osteoarthritis    Pernicious anemia    B12  Def.   Peyronie disease    Renal insufficiency    Sepsis ([place]) 03/22/2017   Thrombocytopenia, unspecified ([place])  hemotology/oncologist-  dr Waymon Budge   per dr Velta Addison note 06/ 2016  secondary to vitro clumping   Vitamin B deficiency     Past Surgical History:  Procedure Laterality Date   BONE MARROW BIOPSY  2011   normal   CARDIAC CATHETERIZATION  2000   per dr Kathyrn Drown note --  dLAD 60-70%,  small intermediate branch 70-80%,  mRCA 20%,  normal LV   CARDIOVASCULAR STRESS TEST  01-29-2013   dr Stanford Breed   normal nuclear study w/ no ischemia,  normal LV function and wall motion , ef 82%   CATARACT EXTRACTION W/ INTRAOCULAR LENS  IMPLANT, BILATERAL  08/2015   CHOLECYSTECTOMY  11/24/2010   Procedure: LAPAROSCOPIC CHOLECYSTECTOMY WITH INTRAOPERATIVE CHOLANGIOGRAM;  Surgeon: Earnstine Regal, MD;  Location: [facility];  Service: General;  Laterality: N/A;  c-arm   CYSTOSCOPY W/ URETERAL STENT PLACEMENT Left 12/17/2015   Procedure: CYSTOSCOPY WITH RETROGRADE PYELOGRAM/URETERAL STENT PLACEMENT;  Surgeon: Ardis Hughs, MD;  Location: [facility];  Service: Urology;  Laterality: Left;   CYSTOSCOPY WITH RETROGRADE PYELOGRAM, URETEROSCOPY AND STENT PLACEMENT Left 12/22/2015   Procedure: CYSTOSCOPY WITH LEFT RETROGRADE  URETEROSCOPY AND STENT PLACEMENT;  Surgeon: Carolan Clines, MD;  Location: [facility];  Service: Urology;  Laterality: Left;   ERCP N/A 07/04/2013   Procedure: ENDOSCOPIC RETROGRADE CHOLANGIOPANCREATOGRAPHY (ERCP);  Surgeon: Gatha Mayer, MD;  Location: [facility];  Service: Endoscopy;  Laterality: N/A;  MAC if available   ERCP N/A 10/12/2015   Procedure: ENDOSCOPIC RETROGRADE CHOLANGIOPANCREATOGRAPHY (ERCP);  Surgeon: [name], MD;  Location: [facility];  Service: Endoscopy;  Laterality: N/A;   HOLMIUM LASER APPLICATION Left 32/09/9240   Procedure: HOLMIUM LASER APPLICATION;  Surgeon: Carolan Clines, MD;  Location: [facility];  Service: Urology;  Laterality: Left;   INGUINAL HERNIA REPAIR Right 01/25/2002   KNEE ARTHROSCOPY  x5   SATURATION  BIOPSY OF PROSTATE  05-29-2007  and 01-26-2008   SHOULDER ARTHROSCOPY WITH OPEN ROTATOR CUFF REPAIR AND DISTAL CLAVICLE ACROMINECTOMY Right 11/19/2004   TOTAL HIP ARTHROPLASTY Right 05/21/2009   TOTAL KNEE ARTHROPLASTY Bilateral left 05-02-2003/  right  03-23-2010   TRANSTHORACIC ECHOCARDIOGRAM  12/23/2004   ef 60%, mild MV calcification without stenosis/  mild TR,  PASP 37mHg   UVULOPALATOPHARYNGOPLASTY  1993    w/  T & A   Family History:  Family History  Problem Relation Age of Onset   Peripheral vascular disease Mother    Colon cancer Father 47  Breast cancer Daughter    Breast cancer Daughter    Family Psychiatric  History: Unremarkable Social History:  Social History   Substance and Sexual Activity  Alcohol Use No   Alcohol/week: 0.0 standard drinks of alcohol     Social History   Substance and  Sexual Activity  Drug Use No    Social History   Socioeconomic History   Marital status: Widowed    Spouse name: Not on file   Number of children: 2   Years of education: Not on file   Highest education level: Not on file  Occupational History   Occupation: retired    Fish farm manager: RETIRED  Tobacco Use   Smoking status: Former    Types: Pipe    Quit date: 01/18/1966    Years since quitting: 55.7   Smokeless tobacco: Never  Vaping Use   Vaping Use: Never used  Substance and Sexual Activity   Alcohol use: No    Alcohol/week: 0.0 standard drinks of alcohol   Drug use: No   Sexual activity: Not Currently  Other Topics Concern   Not on file  Social History Narrative   DNR   Widowed   2 daughters leave near by   Social Determinants of Health   Financial Resource Strain: Low Risk  (08/21/2021)   Overall Financial Resource Strain (CARDIA)    Difficulty of Paying Living Expenses: Not hard at all  Food Insecurity: No Food Insecurity (08/21/2021)   Hunger Vital Sign    Worried About Running Out of Food in the Last Year: Never true    [place] in the Last Year:  Never true  Transportation Needs: No Transportation Needs (08/21/2021)   PRAPARE - Hydrologist (Medical): No    Lack of Transportation (Non-Medical): No  Physical Activity: Inactive (08/21/2021)   Exercise Vital Sign    Days of Exercise per Week: 0 days    Minutes of Exercise per Session: 0 min  Stress: Stress Concern Present (08/21/2021)   [place]    Feeling of Stress : To some extent  Social Connections: Not on file    Hospital Course: Arnette Norris is a 88 year old white male who was admitted on an involuntary commitment by his daughters after taking his deceased wife's hydrocodone which was 88 years old in a suicide attempt.  He presented remorseful.  He states that it was stupid.  He had never seen a psychiatrist before been psychiatrically hospitalized.  He was started on Zyprexa at bedtime for mood and appetite.  He did well with this.  No side effects.  His Zoloft was recently increased from 50 to 100 mg and that continued.  We increased his Neurontin to 200 mg twice a day due to his shoulder pain and put him on some Mobic which helped a little bit.  He continued on trazodone as needed for sleep.  He was very pleasant and cooperative while on the unit.  He participated in groups when he was awake.  He was compliant with his medications.  It was felt that he maximized hospitalization and he was discharged home.  On the day of discharge he denied suicidal ideation, homicidal ideation, auditory or visual hallucinations.  His judgment insight were good.  Physical Findings: AIMS:  , ,  ,  ,    CIWA:    COWS:     Musculoskeletal: Strength & Muscle Tone: within normal limits Gait & Station: normal Patient leans: N/A   Psychiatric Specialty Exam:  Presentation  General Appearance: Appropriate for Environment; Casual  Eye Contact:Fair  Speech:Clear and Coherent; Normal Rate  Speech  Volume:Normal  Handedness:Right   Mood and Affect  Mood:Anxious; Depressed  Affect:Congruent; Appropriate   Thought Process  Thought  Processes:Coherent; Linear  Descriptions of Associations:Intact  Orientation:Full (Time, Place and Person)  Thought Content:Logical  History of Schizophrenia/Schizoaffective disorder:No data recorded Duration of Psychotic Symptoms:No data recorded Hallucinations:No data recorded Ideas of Reference:None  Suicidal Thoughts:No data recorded Homicidal Thoughts:No data recorded  Sensorium  Memory:Immediate Fair; Recent Fair; Remote Fair  Judgment:Fair  Insight:Shallow   Executive Functions  Concentration:Fair  Attention Span:Fair  [place]   Psychomotor Activity  Psychomotor Activity:No data recorded  Assets  Assets:Communication Skills; Desire for Improvement; Financial Resources/Insurance; Housing; Leisure Time; Physical Health; Resilience; Social Support   Sleep  Sleep:No data recorded   Physical Exam: Physical Exam Vitals and nursing note reviewed.  Constitutional:      Appearance: Normal appearance. He is normal weight.  Neurological:     General: No focal deficit present.     Mental Status: He is alert and oriented to person, place, and time.  Psychiatric:        Attention and Perception: Attention and perception normal.        Mood and Affect: Mood and affect normal.        Speech: Speech normal.        Behavior: Behavior normal. Behavior is cooperative.        Thought Content: Thought content normal.        Cognition and Memory: Cognition and memory normal.        Judgment: Judgment normal.    Review of Systems  Constitutional: Negative.   HENT: Negative.    Eyes: Negative.   Respiratory: Negative.    Cardiovascular: Negative.   Gastrointestinal: Negative.   Genitourinary: Negative.   Musculoskeletal: Negative.   Skin: Negative.   Neurological: Negative.    Endo/Heme/Allergies: Negative.   Psychiatric/Behavioral: Negative.     Blood pressure (!) 141/50, pulse 61, temperature 98.6 F (37 C), temperature source Oral, resp. rate 18, height 5' (1.524 m), weight 59 kg, SpO2 98 %. Body mass index is 25.39 kg/m.   Social History   Tobacco Use  Smoking Status Former   Types: Pipe   Quit date: 01/18/1966   Years since quitting: 55.7  Smokeless Tobacco Never   Tobacco Cessation:  A prescription for an FDA-approved tobacco cessation medication was offered at discharge and the patient refused   Blood Alcohol level:  No results found for: "ETH"  Metabolic Disorder Labs:  Lab Results  Component Value Date   HGBA1C 4.8 09/21/2021   MPG 91.06 09/21/2021   No results found for: "PROLACTIN" Lab Results  Component Value Date   CHOL 96 09/21/2021   TRIG 93 09/21/2021   HDL 25 (L) 09/21/2021   CHOLHDL 3.8 09/21/2021   VLDL 19 09/21/2021   LDLCALC 52 09/21/2021   [place] 73 09/07/2019    See Psychiatric Specialty Exam and Suicide Risk Assessment completed by Attending Physician prior to discharge.  Discharge destination:  Home  Is patient on multiple antipsychotic therapies at discharge:  No   Has Patient had three or more failed trials of antipsychotic monotherapy by history:  No  Recommended Plan for Multiple Antipsychotic Therapies: NA   Allergies as of 09/23/2021       Reactions   Penicillins Hives, Other (See Comments)   [place], passed out Tolerates cephalosporins Has patient had a PCN reaction causing immediate rash, facial/tongue/throat swelling, SOB or lightheadedness with hypotension:  yes Has patient had a PCN reaction causing severe rash involving mucus membranes or skin necrosis: no Has patient had a PCN reaction  that required hospitalization: no Has patient had a PCN reaction occurring within the last 10 years: no If all of the above answers are "NO", then may proceed with Cephalosporin use.   Tape Hives, Other (See  Comments)   Paper Tape        Medication List     STOP taking these medications    acetaminophen 500 MG tablet Commonly known as: TYLENOL       TAKE these medications      Indication  diclofenac Sodium 1 % Gel Commonly known as: Voltaren Arthritis Pain Apply 2 g topically 3 (three) times daily as needed. What changed: reasons to take this    Ensure Take 237 mLs by mouth daily. Chocolate    finasteride 5 MG tablet Commonly known as: PROSCAR Take 1 tablet (5 mg total) by mouth daily.    fluticasone 50 MCG/ACT nasal spray Commonly known as: FLONASE Place 1 spray into both nostrils 2 (two) times daily.    gabapentin 100 MG capsule Commonly known as: NEURONTIN Take 2 capsules (200 mg total) by mouth 2 (two) times daily. What changed: how much to take  Indication: Generalized Anxiety Disorder, Neuropathic Pain   Gemtesa 75 MG Tabs Generic drug: Vibegron Take 1 tablet by mouth daily.    loratadine 10 MG tablet Commonly known as: CLARITIN Take 1 tablet (10 mg total) by mouth daily. For allergies    meloxicam 7.5 MG tablet Commonly known as: MOBIC Take 1 tablet (7.5 mg total) by mouth daily after breakfast. Start taking on: September 24, 2021  Indication: Joint Damage causing Pain and Loss of Function   OLANZapine 5 MG tablet Commonly known as: ZYPREXA Take 1 tablet (5 mg total) by mouth at bedtime.  Indication: Major Depressive Disorder   senna-docusate 8.6-50 MG tablet Commonly known as: Senokot-S Take 1 tablet by mouth at bedtime.  Indication: Constipation   sertraline 100 MG tablet Commonly known as: ZOLOFT Take 1 tablet (100 mg total) by mouth daily. Start taking on: September 24, 2021  Indication: Major Depressive Disorder   tamsulosin 0.4 MG Caps capsule Commonly known as: FLOMAX Take 2 capsules (0.8 mg total) by mouth daily. For urine flow What changed: additional instructions    TEARS NATURALE OP Place 1-2 drops into both eyes at bedtime as  needed (dry eyes).    traZODone 50 MG tablet Commonly known as: DESYREL Take 1 tablet (50 mg total) by mouth at bedtime as needed for sleep. What changed:  when to take this reasons to take this  Indication: Trouble Sleeping, Major Depressive Disorder         Follow-up recommendations:  PCP   Signed: Parks Ranger, DO 09/23/2021, 10:36 AM

## 2021-09-23 NOTE — Care Management Important Message (Signed)
Important Message  Patient Details  Name: [name] MRN: [id number] Date of Birth: [birth year]   Medicare Important Message Given:  Yes     Rozann Lesches, LCSW 09/23/2021, 11:34 AM

## 2021-09-23 NOTE — BHH Suicide Risk Assessment (Signed)
[facility] Discharge Suicide Risk Assessment   Principal Problem: MDD (major depressive disorder), recurrent episode, severe ([place]) Discharge Diagnoses: Principal Problem:   MDD (major depressive disorder), recurrent episode, severe ([place])   Total Time spent with patient: 1 hour  Musculoskeletal: Strength & Muscle Tone: within normal limits Gait & Station: normal Patient leans: N/A  Psychiatric Specialty Exam  Presentation  General Appearance: Appropriate for Environment; Casual  Eye Contact:Fair  Speech:Clear and Coherent; Normal Rate  Speech Volume:Normal  Handedness:Right   Mood and Affect  Mood:Anxious; Depressed  Duration of Depression Symptoms: No data recorded Affect:Congruent; Appropriate   Thought Process  Thought Processes:Coherent; Linear  Descriptions of Associations:Intact  Orientation:Full (Time, Place and Person)  Thought Content:Logical  History of Schizophrenia/Schizoaffective disorder:No data recorded Duration of Psychotic Symptoms:No data recorded Hallucinations:No data recorded Ideas of Reference:None  Suicidal Thoughts:No data recorded Homicidal Thoughts:No data recorded  Sensorium  Memory:Immediate Fair; Recent Fair; Remote Fair  Judgment:Fair  Insight:Shallow   Executive Functions  Concentration:Fair  Attention Span:Fair  [place]   Psychomotor Activity  Psychomotor Activity:No data recorded  Assets  Assets:Communication Skills; Desire for Improvement; Financial Resources/Insurance; Housing; Leisure Time; Physical Health; Resilience; Social Support   Sleep  Sleep:No data recorded   Blood pressure (!) 141/50, pulse 61, temperature 98.6 F (37 C), temperature source Oral, resp. rate 18, height 5' (1.524 m), weight 59 kg, SpO2 98 %. Body mass index is 25.39 kg/m.  Mental Status Per Nursing Assessment::   On Admission:  NA  Demographic Factors:  Male, Age 86 or older, and  Caucasian  Loss Factors: NA  Historical Factors: Prior suicide attempts  Risk Reduction Factors:   Sense of responsibility to family, Religious beliefs about death, Positive social support, Positive therapeutic relationship, and Positive coping skills or problem solving skills  Continued Clinical Symptoms:  Chronic Pain  Cognitive Features That Contribute To Risk:  None    Suicide Risk:  Minimal: No identifiable suicidal ideation.  Patients presenting with no risk factors but with morbid ruminations; may be classified as minimal risk based on the severity of the depressive symptoms    Plan Of Care/Follow-up recommendations: PCP   [place] Ranger, DO 09/23/2021, 10:11 AM

## 2021-09-23 NOTE — Group Note (Signed)
[place] LCSW Group Therapy Note   Group Date: 09/23/2021 Start Time: 2831 End Time: 1445   Type of Therapy/Topic:  Group Therapy:  Emotion Regulation  Participation Level:  Minimal   Mood:  Description of Group:    The purpose of this group is to assist patients in learning to regulate negative emotions and experience positive emotions. Patients will be guided to discuss ways in which they have been vulnerable to their negative emotions. These vulnerabilities will be juxtaposed with experiences of positive emotions or situations, and patients challenged to use positive emotions to combat negative ones. Special emphasis will be placed on coping with negative emotions in conflict situations, and patients will process healthy conflict resolution skills.  Therapeutic Goals: Patient will identify two positive emotions or experiences to reflect on in order to balance out negative emotions:  Patient will label two or more emotions that they find the most difficult to experience:  Patient will be able to demonstrate positive conflict resolution skills through discussion or role plays:   Summary of Patient Progress: Patient arrived to group late.  CSW notes that the patient was speaking with daughter via the phone and with nursing staff and that prevented him from attending group on time.  Patient was quiet in group.  Patient was attentive and supportive of others in group.  Patient reports that his coping skills are "talk it out and read about the Bible".    Therapeutic Modalities:   Cognitive Behavioral Therapy Feelings Identification Dialectical Behavioral Therapy   Rozann Lesches, LCSW

## 2021-09-23 NOTE — BHH Counselor (Signed)
CSW spoke with the patient's daughter Lemmie Evens, daughter, [phone].  CSW updated that the patient is scheduled for discharge.  Daughter expressed concerns that patient is not ready for discharge due to no having attended any group therapy sessions.  CSW explained that patient has been asleep during group and CSW did not wish to wake patient as sleep is important.  CSW further explained that participation in group therapy is optional and not mandatory and does not prevent someone from being able to discharge.  Daughter expressed confusion as to why patient was admitted if he was going to have the option to not attend treatment.  CSW explained that patient still has the opportunity to meet with the doctor, nursing, engage in the milieu and have any medications adjusted.  Daughter reports that she will call this CSW back.    Call terminated without incident.  CSW notes that conversation occurred prior to [place], MSW, LCSW 09/23/2021 10:56 AM

## 2021-09-23 NOTE — BHH Counselor (Signed)
CSW met with the patient and reviewed SDOH Questionnaire.  CSW has inputted the responses.    CSW spoke with the patient on aftercare plans.  Patient again stated that he did not wish to have a therapy or psychiatry appointment scheduled.  CSW explained that patient will have walk in information at a local mental health agency, should he change his mind.   CSW spoke with patient about daughter [name] calling. Patient provided permission for CSW to speak with [name].    CSW continued with discharge questions and patient stated that he did not need cab but that his daughters would provide transportation.    [name], MSW, LCSW 09/23/2021 11:04 AM

## 2021-09-23 NOTE — Progress Notes (Signed)
  [facility] Adult Case Management Discharge Plan :  Will you be returning to the same living situation after discharge:  Yes,  pt reports that he is returning home.  At discharge, do you have transportation home?: Yes,  Daughter will provide transportation. Do you have the ability to pay for your medications: Yes,  [employer] Medicare  Release of information consent forms completed and in the chart;  Patient's signature needed at discharge.  Patient to Follow up at:  Follow-up Information     [facility] Follow up.   Specialty: Behavioral Health Why: You've declined aftercare, should you change your mind, this provider accepts your insurance. Contact information: [address], [place] [facility] Follow up.   Why: This agency is referred to as [place].  They only have one therapist that can see patient, they have an 8 month waitlist.  To see the medication provider (psychiatrist) appointment is within 2 weeks. Contact information: [place] [address] [place], [facility] Follow up.   Why: You've declined aftercare, should you change your mind, this provider accepts your insurance. Contact information: [address] [place] [address] [phone]         [facility] Follow up.   Why: They do NOT offer medication (psychiatrist).  They do offer therapy but provider is scheduling in May of 2024 currently. Contact information: [place] [address] [place], [facility] Follow up.   Specialty: Behavioral Health Why: This is Dr. Karen Chafe location. Appointment per family is 11-02-2021 Contact information: [place] [address] [phone]         [facility] Follow up.    Why: Walk in hours are Monday, Wednesday and Friday at [facility].  This can be utilized in a crisis. Contact information: [place] [address] [phone]         [facility], [facility] .   Contact information: [address] [place] [address] [phone]                 Next level of care provider has access to [place] and Suicide Prevention discussed: Yes,  SPE completed with the patient's daughter.     Has patient been referred to the Quitline?: Patient refused referral  Patient has been referred for addiction treatment: [place], LCSW 09/23/2021, 5:38 PM

## 2021-09-23 NOTE — BHH Counselor (Signed)
CSW returned call to patient's daugther [name], [phone].  [name] expressed concerns on patient being rady for dishcarge due to lack of participation in the services offered on the unit, specifically group therapy.    CSW explained that group is not mandatory and that when group has been held the patient has been asleep.  CSW explained that CSW would not wake patient for group as sleep is an important aspect of wellbeing.  Daughter expressed concern that the patient is sleeping more due to depression.  CSW explained that she was unsure of increased sleep being related to depression or if patient has chosen to not participate in group at patient's own admission "those people have a lot more going on than me".    Daughter expressed some concern in discrepancy in notes from Dr. Louis Meckel.  CSW sent a secure chat to the doctor with the concerns. Daughter missed Dr. Shaune Leeks initial call due to still being on the phone with CSW and CSW advised that she call him back at the termination of this call.  Daughter expressed concern and confusion on patient being able to choose to not attend group as staff at the original hospital patient was transferred from informed that patient would have to attend the therapy sessions. CSW explained that was not true.   Daughter expressed concern that the patient would return home without a "sitter" or additional services. CSW explained pt has declined aftercare.  CSW further explained that there are no notes from OT/PT for recommendation for SNF or home health assistance.  Daughter reports that patient has a cane and walker at home for use.   Daughter informed CSW that patient has appointment with Dr. Casimiro Needle in October.  She further informed that both daughters and PCP think patient will benefit form outpatient services.   CSW again explained that patient has the right to choose.  Daughter wanted to know if there was any way to utilize the family and PCP desires to  leverage patient into care and CSW informed that patient has right to choose.  CSW advised that if family has concerns for patient's wellbeing they can always return to the hospital for further evaluation.  [name], MSW, LCSW 09/23/2021 11:13 AM

## 2021-09-23 NOTE — BHH Counselor (Signed)
CSW spoke with the patient's daughter,Karen Loletha Grayer, [phone].  She expressed concerns that patient is not ready for discharge due to not attending group and receiving counseling.  Daughter also stated concerns that the patient has not agreed to an aftercare appointment.   CSW reiterated that group is optional and patient has the choice.  CSW further explained that the patient has the right to choose if he wants aftercare. Daughter expressed that she does not think that the father understands and CSW expressed that father has been clear in his conversations with her.    CSW encouraged she speak with the father.   CSW advised that this CSW is gathering clothes for fathers discharge and will follow up with the father once returned to the unit.  CSW notes that this conversation took place at [place], MSW, LCSW 09/23/2021 4:39 PM

## 2021-09-23 NOTE — BHH Counselor (Addendum)
ADDENDUM CSW notes that during this conversation daughter reported plans to appeal the patient's discharge.  CSW explained that the patient is competent and own guardian and he would have to initiate that process.  Daughter reports that she will work on this with him and CSW encouraged her to call her father and provided nurses station number.   CSW also notes that she has included several referrals in follow up provider and patient's request.  He can contact providers to get scheduled for aftercare.  Also during that conversation the daughter had question on the patient's IVC.  CSW reached out to both nurse Estill Bamberg and Dr. Louis Meckel.  CSW was informed by Dr. Louis Meckel that IVC expires in 6 days and CSW relayed this information to the family.   [name], MSW, LCSW 09/23/2021 5:41 PM    CSW followed up with the patient's daughter, Lemmie Evens, [phone].  Daughter again expressed concerns of the patient's discharge.  Daughter specifically mentioned concerns for patient not having received counseling and not not having an aftercare appointment.   CSW explained again that patient has declined to have an aftercare appointment. CSW pointed out that patient has been clear in his conversation with this CSW and has been able to teach back to this clinician.  CSW also pointed out that patient has been asleep for group therapy and CSW did not wake patient and force attendance.  CSW informed that patient did attend group today and was attentive and supportive of others in group.  CSW sought clarity on if daughter would be picking the patient up at the expected 4:00 PM discharge time.  Daughter expressed that she WOULD NOT.  Daughter did express that she would come by to visit the patient during visitation hours.  CSW expressed that if patient is not picked up today, 09/23/2021, expectation would be for the patient to be discharged on 09/24/2021.  CSW asked if the daughter would leave the patient's keys during  the visit.  Daughter appeared upset at this statement as evidenced by tone and questioned that if the patient had his keys would the hospital send him home in a cab.  CSW let the daughter know that this would require staffing with all parties including physician, nursing, patient and family, but if deemed appropriate the patient could be discharged to his home via medical safe transport.  Daughter stated that patient did NOT need medical transport stating that she has observed him to move around fine.  CSW expressed that if patient is not picked up today, 09/23/2021, expectation would be for the patient to be discharged on 09/24/2021.  Daughter asked what would be different in today versus tomorrow and CSW explained that patient has been deemed clear for discharge by the psych provider and this would allow the family the opportunity to discuss further any needs they may have and to come to an agreement.  Daughter expressed several times not feeling heard and that she wanted to do what she felt was best for her father.  CSW provided active listening and empathy.  CSW pointed out that the patient is allowed to make his choices and though others may not like or agree with them, the patient has that right.    Daughter reiterated that she felt patient did not understand the severity of not having aftercare or attending counseling.  CSW offered to meet with patient again to discuss this.    CSW expressed to daughter that conversation with patient appears to be imperative as  CSW has spoken with the father, psychiatrist, and family and has their perspective but there is not a consensus as the family wants a different plan than the one that has been established.   CSW offered to meet again with the patient.   CSW did meet with the patient.  CSW utilized teach back to have the patient explain to CSW therapy and psychiatry with a medication provider.  Patient was able to do so.  CSW spoke with patient on scheduling an  appointment. Patient stated that he did not wish to have one, however stated "that seems like that's the only way I am going to get out of here".  CSW noted to patient that this was concerning as CSW felt that patient felt his rights were not being respected.  Patient again reiterated that he wanted to go home and "this seems the only way".  CSW obtained permission to schedule patient for an appointment for aftercare.   CSW contacted the following:  [facility]--attempt to push up appointment with Dr. Casimiro Needle on 10-16/2023.  No answer.  CSW attempted several times.   [facility]--Appointment with Dr. Casimiro Needle is at end of October, later than current appointment.  [facility]--Can see patient. Therapy is scheduled out 8 months due to only having one provider.  Medication appointment within 2 weeks.  [facility]--No medication provider, therapy scheduling in May 2024.  CSW relayed the above information on appointments to the patient.  Patient expressed frustration at having to schedule appointment and there being none readily available.  Patient requested that we schedule with [facility].  CSW called with no answer and left HIPAA compliant voicemail.  CSW called patient's daughter [name] back with the patient in the room. Call was on speaker.  CSW explained the difficulty with scheduling appointments.  CSW pointed out the patient's statement earlier about appointments and feeling that this was the only way he could go home and CSW concerns for patient's rights.  Patient asked daughter if she would pick him up.  She reports that she will pick up in 30 minutes.  Patient expressed happiness at this.  CSW followed up on any SDOH and patient declined any assistance.  Daughter pointed out that father is not always truthful, noting a statement in someone note for the patient that patient said he "only has $33 and family buys groceries".  Daughter pointed he pays for  his groceries but the family brings it to him.  CSW made nursing staff aware of discharge.    [name], MSW, LCSW 09/23/2021 5:35 PM

## 2021-09-23 NOTE — BHH Counselor (Signed)
CSW received call from patient's other daughter [name].  [name] had similar concerns as her sister in regards to patient being ready for discharge if he has not participated in group therapy.  CSW informed that without consent CSW is unable to share information.   CSW informed that she will speak with the patient and follow up once out of a meeting.   Call occurrent at 9:49 AM.   Call terminated without incident.    CSW to follow up.  [name], MSW, LCSW 09/23/2021 10:59 AM

## 2021-09-23 NOTE — Progress Notes (Signed)
Pt was educated on prescriptions and follow up care. Patient denies SI, HI, and AVH. Pt questions were answered and pt verbalized understanding and did not voice any concerns. Pt's belongings were returned. Pt was safely discharged to the [facility].

## 2021-09-24 ENCOUNTER — Telehealth: Payer: Self-pay | Admitting: *Deleted

## 2021-09-24 ENCOUNTER — Ambulatory Visit: Payer: Medicare HMO | Admitting: Podiatry

## 2021-09-24 NOTE — Patient Outreach (Signed)
  Care Coordination [facility] Note Transition Care Management Follow-up Telephone Call Date of discharge and from where: [facility] [id number] How have you been since you were released from the hospital? Doing ok Any questions or concerns? Yes Per daughter patient needed his PT and OT restarted. RN discussed how to get this restarted Items Reviewed: Did the pt receive and understand the discharge instructions provided? Yes  Medications obtained and verified? Yes  Other? No  Any new allergies since your discharge? No  Dietary orders reviewed? No Do you have support at home? Yes   Home Care and Equipment/Supplies: Were home health services ordered? no If so, what is the name of the agency? N/a  Has the agency set up a time to come to the patient's home? no Were any new equipment or medical supplies ordered?  No What is the name of the medical supply agency? N/a Were you able to get the supplies/equipment? not applicable Do you have any questions related to the use of the equipment or supplies? No  Functional Questionnaire: (I = Independent and D = Dependent) ADLs: I  Bathing/Dressing- D  Meal Prep- D  Eating- I  Maintaining continence- I  Transferring/Ambulation- I  Managing Meds- D  Follow up appointments reviewed:  PCP Hospital f/u appt confirmed? No  Daughter refused nurse assistance in setting appointment and needed to set herself [place] [facility] f/u appt confirmed? Yes  Scheduled to see on Psych 09/162023 Are transportation arrangements needed? No  If their condition worsens, is the pt aware to call PCP or go to the Emergency Dept.? Yes RN discussed the crises places for patient also from discharge summary Was the patient provided with contact information for the PCP's office or ED? Yes Was to pt encouraged to call back with questions or concerns? Yes  SDOH assessments and interventions completed:   Yes  Care Coordination Interventions Activated:  Yes   Care Coordination  Interventions:   N/a     Encounter Outcome:  Pt. Visit Completed    [place] [facility] [phone]

## 2021-09-28 ENCOUNTER — Telehealth: Payer: Self-pay | Admitting: Primary Care

## 2021-09-28 DIAGNOSIS — N401 Enlarged prostate with lower urinary tract symptoms: Secondary | ICD-10-CM

## 2021-09-28 MED ORDER — FINASTERIDE 5 MG PO TABS
5.0000 mg | ORAL_TABLET | Freq: Every day | ORAL | 0 refills | Status: DC
Start: 2021-09-28 — End: 2022-02-10

## 2021-09-28 NOTE — Telephone Encounter (Signed)
Sure, no problem. I will send a prescription for finasteride to his pharmacy. Please also have them follow up with his Urologist for his chronic urinary retention.

## 2021-09-28 NOTE — Telephone Encounter (Signed)
Patient daughter [name] called in and stated he was prescribed finasteride (PROSCAR) 5 MG tablet. She stated they didn't receive the medication. Can this be sent in to his pharmacy? [place] Please advise. Thank you!

## 2021-09-28 NOTE — Telephone Encounter (Signed)
Patient was started while he was in hospital. No script called in. No hospital follow-up.

## 2021-09-29 ENCOUNTER — Telehealth: Payer: Self-pay | Admitting: Primary Care

## 2021-09-29 DIAGNOSIS — G8929 Other chronic pain: Secondary | ICD-10-CM

## 2021-09-29 DIAGNOSIS — H903 Sensorineural hearing loss, bilateral: Secondary | ICD-10-CM | POA: Diagnosis not present

## 2021-09-29 DIAGNOSIS — R296 Repeated falls: Secondary | ICD-10-CM

## 2021-09-29 DIAGNOSIS — H9222 Otorrhagia, left ear: Secondary | ICD-10-CM | POA: Diagnosis not present

## 2021-09-29 NOTE — Telephone Encounter (Signed)
Called [name], information given. She will have him follow up with urology. Requested appointment in office soon. I have set up for 9/27 at end of day in 40 min.

## 2021-09-29 NOTE — Telephone Encounter (Signed)
Patient daughter [name] called in and stated that [name] was having visits from [facility] for PT, OT, and one more service. She was wanting to see if she get those reinstated. She stated with him been in the hospital the services had expired. Please advise. Thank you!

## 2021-09-30 NOTE — Telephone Encounter (Signed)
Please notify patient's family that I placed a new referral for [facility].

## 2021-09-30 NOTE — Addendum Note (Signed)
Addended by: Pleas Koch on: 09/30/2021 09:27 PM   Modules accepted: Orders

## 2021-10-01 NOTE — Telephone Encounter (Signed)
Routing to Dr. Diona Browner since he is scheduled with her.   Agree with ER advice and precautions

## 2021-10-01 NOTE — Telephone Encounter (Signed)
When I called patient daughter she states that he had fall last night. Hit head and face on his nose. Did not loose consciousness. States she called this morning and made a appointment for patient to be seen tomorrow. Reviewed with her concern when head injury and advised that she take to ED for evaluation. She declined states that he has not had any thing that concerns her and is more concerned about the spots on arms where he tried to rip out his IV from last time he was at hospital. She declined taking him to ED. I have reviewed all red word in detail with her and states if he has any she will call 911. She will call back if any further questions.

## 2021-10-01 NOTE — Telephone Encounter (Signed)
Spoke to daughter let her know that we have put that referral in for her.

## 2021-10-01 NOTE — Telephone Encounter (Signed)
Noted  

## 2021-10-02 ENCOUNTER — Ambulatory Visit (INDEPENDENT_AMBULATORY_CARE_PROVIDER_SITE_OTHER): Payer: Medicare HMO | Admitting: Family Medicine

## 2021-10-02 ENCOUNTER — Encounter: Payer: Self-pay | Admitting: Family Medicine

## 2021-10-02 VITALS — BP 100/60 | HR 79 | Temp 98.3°F | Ht 60.0 in | Wt 136.0 lb

## 2021-10-02 DIAGNOSIS — S0990XA Unspecified injury of head, initial encounter: Secondary | ICD-10-CM

## 2021-10-02 DIAGNOSIS — S61412A Laceration without foreign body of left hand, initial encounter: Secondary | ICD-10-CM | POA: Insufficient documentation

## 2021-10-02 DIAGNOSIS — R21 Rash and other nonspecific skin eruption: Secondary | ICD-10-CM | POA: Diagnosis not present

## 2021-10-02 DIAGNOSIS — D696 Thrombocytopenia, unspecified: Secondary | ICD-10-CM

## 2021-10-02 DIAGNOSIS — F332 Major depressive disorder, recurrent severe without psychotic features: Secondary | ICD-10-CM

## 2021-10-02 DIAGNOSIS — W19XXXA Unspecified fall, initial encounter: Secondary | ICD-10-CM | POA: Diagnosis not present

## 2021-10-02 DIAGNOSIS — S61412D Laceration without foreign body of left hand, subsequent encounter: Secondary | ICD-10-CM

## 2021-10-02 MED ORDER — TRIAMCINOLONE ACETONIDE 0.5 % EX CREA: 1.0000 | TOPICAL_CREAM | Freq: Two times a day (BID) | CUTANEOUS | 0 refills | Status: AC

## 2021-10-02 NOTE — Assessment & Plan Note (Signed)
Chronic, poor control with recent suicide attempt.  They are waiting on referral to geriatric psychiatry.

## 2021-10-02 NOTE — Assessment & Plan Note (Signed)
Reviewed necessary walker use.  Reviewed fall safety at home.  No preceding symptoms.  No additional work-up needed.

## 2021-10-02 NOTE — Assessment & Plan Note (Signed)
Acute, normal neuro exam and no headaches.  Mild abrasion healing well.  No indication for head CT.  Reviewed ER and return precautions for potential subacute intracranial bleeding in detail with daughter.

## 2021-10-02 NOTE — Assessment & Plan Note (Signed)
Chronic, followed by oncology  Recent platelet testing and hospitalization was improved from baseline at 90

## 2021-10-02 NOTE — Assessment & Plan Note (Signed)
Acute, area cleaned with warm soapy water gauze stuck on removed.  Appears to be healing with granulation tissue, no ongoing infection.  Recommended daily washing with warm soapy water nonstick dressing and Neosporin.

## 2021-10-02 NOTE — Progress Notes (Signed)
Patient ID: [name], male    DOB: 03-04-1933, 88 y.o.   MRN: [id number]  This visit was conducted in person.  BP 100/60   Pulse 79   Temp 98.3 F (36.8 C) (Oral)   Ht 5' (1.524 m)   Wt 136 lb (61.7 kg)   SpO2 98%   BMI 26.56 kg/m    CC:  Chief Complaint  Patient presents with   Fall    On 09/30/21-Head and Nose laceration   Laceration    Left Arm-From him pulling out IVs while in the hospital   Follow-up    Tick Bite on Left Leg    Subjective:   HPI: [name] is a 88 y.o. male  patient of [facility] presenting on 10/02/2021 for Fall (On 09/30/21-Head and Nose laceration), Laceration (Left Arm-From him pulling out IVs while in the hospital), and Follow-up (Tick Bite on Left Leg)  Presents with daughter   Hospitalized at [facility] then [facility]  discharge summary: Admitted from the EMS after found by daughter with lethargy and confusion, hallucinations.  following overdose of old hydrocodone, suicide attempt and  MDD.   Daughter reports he was doing better until 2 days ago. Fell and hit head when lost balance on carpet, dragged feet... hit left forehead on wall. Glasses cut nose on 9/13.  No headaches and at baseline mental status.  No  proceeding Chest pain, no palpitations, no dizziness, no SOB.  Emergency life line did not go off.  Applying neosporin daily. Cleaned initially.  Has new diagnosis of leukemia.. followed by Dr.  Lorenso Courier.  Family is trying to keep this diagnosis away from [name] given his depression.      Seen in [place] for tick bite 7/3/... treated with doxycycline.  Now with more skin changes.  Area remains very itchy and he is rubbing it frequently.  No pain no heat.   Also continues to have ound on left hand present  after he pulled  IV out forcefully.   Relevant past medical, surgical, family and social history reviewed and updated as indicated. Interim medical history since our last visit reviewed. Allergies and  medications reviewed and updated. Outpatient Medications Prior to Visit  Medication Sig Dispense Refill   Artificial Tear Solution (TEARS NATURALE OP) Place 1-2 drops into both eyes at bedtime as needed (dry eyes).     diclofenac Sodium (VOLTAREN ARTHRITIS PAIN) 1 % GEL Apply 2 g topically 3 (three) times daily as needed. (Patient taking differently: Apply 2 g topically 3 (three) times daily as needed (left shoulder and arm pain).) 100 g 3   Ensure (ENSURE) Take 237 mLs by mouth daily. Chocolate     finasteride (PROSCAR) 5 MG tablet Take 1 tablet (5 mg total) by mouth daily. For urine flow 90 tablet 0   fluticasone (FLONASE) 50 MCG/ACT nasal spray Place 1 spray into both nostrils 2 (two) times daily. 48 g 3   gabapentin (NEURONTIN) 100 MG capsule Take 2 capsules (200 mg total) by mouth 2 (two) times daily. 120 capsule 3   GEMTESA 75 MG TABS Take 1 tablet by mouth daily.     loratadine (CLARITIN) 10 MG tablet Take 1 tablet (10 mg total) by mouth daily. For allergies 90 tablet 3   meloxicam (MOBIC) 7.5 MG tablet Take 1 tablet (7.5 mg total) by mouth daily after breakfast. 30 tablet 3   OLANZapine (ZYPREXA) 5 MG tablet Take 1 tablet (5 mg total) by  mouth at bedtime. 30 tablet 3   senna-docusate (SENOKOT-S) 8.6-50 MG tablet Take 1 tablet by mouth at bedtime. 30 tablet 3   sertraline (ZOLOFT) 100 MG tablet Take 1 tablet (100 mg total) by mouth daily. 30 tablet 3   tamsulosin (FLOMAX) 0.4 MG CAPS capsule Take 2 capsules (0.8 mg total) by mouth daily. For urine flow (Patient taking differently: Take 0.8 mg by mouth daily.) 180 capsule 0   traZODone (DESYREL) 50 MG tablet Take 1 tablet (50 mg total) by mouth at bedtime as needed for sleep. 30 tablet 3   No facility-administered medications prior to visit.     Per HPI unless specifically indicated in ROS section below Review of Systems  Constitutional:  Negative for fatigue and fever.  HENT:  Negative for ear pain.   Eyes:  Negative for pain.   Respiratory:  Negative for cough and shortness of breath.   Cardiovascular:  Negative for chest pain, palpitations and leg swelling.  Gastrointestinal:  Negative for abdominal pain.  Genitourinary:  Negative for dysuria.  Musculoskeletal:  Negative for arthralgias.  Neurological:  Negative for syncope, light-headedness and headaches.  Psychiatric/Behavioral:  Negative for dysphoric mood.    Objective:  BP 100/60   Pulse 79   Temp 98.3 F (36.8 C) (Oral)   Ht 5' (1.524 m)   Wt 136 lb (61.7 kg)   SpO2 98%   BMI 26.56 kg/m   Wt Readings from Last 3 Encounters:  10/02/21 136 lb (61.7 kg)  09/10/21 130 lb 8.2 oz (59.2 kg)  09/01/21 131 lb (59.4 kg)      Physical Exam Constitutional:      Appearance: He is well-developed.  HENT:     Head: Normocephalic.     Right Ear: Hearing normal.     Left Ear: Hearing normal.     Nose: Nose normal.  Neck:     Thyroid: No thyroid mass or thyromegaly.     Vascular: No carotid bruit.     Trachea: Trachea normal.  Cardiovascular:     Rate and Rhythm: Normal rate and regular rhythm.     Pulses: Normal pulses.     Heart sounds: Heart sounds not distant. No murmur heard.    No friction rub. No gallop.     Comments: No peripheral edema Pulmonary:     Effort: Pulmonary effort is normal. No respiratory distress.     Breath sounds: Normal breath sounds.  Skin:    General: Skin is warm and dry.     Findings: No rash.          Comments: Well-healing abrasion on left forehead no surrounding erythema  Open wound on left dorsal hand with granulation tissue approximately 2 cm x 1 cm   Psychiatric:        Attention and Perception: Attention normal.        Mood and Affect: Affect is flat.        Speech: Speech normal.        Behavior: Behavior normal.        Thought Content: Thought content includes suicidal ideation. Thought content does not include suicidal plan.        Cognition and Memory: Cognition is impaired. Memory is impaired. He does  not exhibit impaired remote memory.        Judgment: Judgment is impulsive and inappropriate.        Results for orders placed or performed during the hospital encounter of 09/04/21  Culture, blood (routine x  2)   Specimen: BLOOD  Result Value Ref Range   Specimen Description      BLOOD LEFT ANTECUBITAL Performed at [facility] [address] [place], [place] [address]    Special Requests      BOTTLES DRAWN AEROBIC AND ANAEROBIC Blood Culture adequate volume Performed at [facility] [address] [place], [place] [address]    Culture      NO GROWTH 5 DAYS Performed at [facility], [place] [address], [place], [place] [address]    Report Status 09/09/2021 FINAL   Culture, blood (routine x 2)   Specimen: BLOOD  Result Value Ref Range   Specimen Description      BLOOD SITE NOT SPECIFIED Performed at [facility] [address] [place], [place] [address]    Special Requests      BOTTLES DRAWN AEROBIC AND ANAEROBIC Blood Culture results may not be optimal due to an excessive volume of blood received in culture bottles Performed at [facility] [address] [place], [place] [address]    Culture      NO GROWTH 5 DAYS Performed at [facility], [place] [address], [place], [place] [address]    Report Status 09/09/2021 FINAL   Resp Panel by RT-PCR (Flu A&B, Covid)   Specimen: Nasal Swab  Result Value Ref Range   SARS Coronavirus 2 by RT PCR NEGATIVE NEGATIVE   Influenza A by PCR NEGATIVE NEGATIVE   Influenza B by PCR NEGATIVE NEGATIVE  Urine Culture   Specimen: Urine, Catheterized  Result Value Ref Range   Specimen Description      URINE, CATHETERIZED Performed at [facility], [place] [address] [place], [place] [address]    Special Requests      NONE Performed at [facility], [place] [address] [place], [place] [address]    Culture (A)      1,000 COLONIES/mL STAPHYLOCOCCUS EPIDERMIDIS CALL MICROBIOLOGY LAB IF SENSITIVITIES ARE REQUIRED. Performed at [facility], [place] [address], [place], [place] [address]    Report Status 09/06/2021 FINAL   Resp Panel by RT-PCR (Flu A&B, Covid) Anterior Nasal Swab   Specimen: Anterior Nasal Swab  Result Value Ref Range   SARS Coronavirus 2 by RT PCR NEGATIVE NEGATIVE   Influenza A by PCR NEGATIVE NEGATIVE   Influenza B by PCR NEGATIVE NEGATIVE  CBC with Differential  Result Value Ref Range   WBC 14.5 (H) 4.0 - 10.5 K/uL   RBC 5.46 4.22 - 5.81 MIL/uL   Hemoglobin 14.8 13.0 - 17.0 g/dL   HCT 47.6 39.0 - 52.0 %   MCV 87.2 80.0 - 100.0 fL   MCH 27.1 26.0 - 34.0 pg   MCHC 31.1 30.0 - 36.0 g/dL   RDW 15.9 (H) 11.5 - 15.5 %   Platelets 56 (L) 150 - 400 K/uL   nRBC 0.0 0.0 - 0.2 %   Neutrophils Relative % 67 %   Neutro Abs 9.7 (H) 1.7 - 7.7 K/uL   Lymphocytes Relative 10 %   Lymphs Abs 1.5 0.7 - 4.0 K/uL   Monocytes Relative 19 %   Monocytes Absolute 2.8 (H) 0.1 - 1.0 K/uL   Eosinophils Relative 0 %   Eosinophils Absolute 0.0 0.0 - 0.5 K/uL   Basophils Relative 1 %   Basophils Absolute 0.1 0.0 - 0.1 K/uL   Immature Granulocytes 3 %   Abs Immature Granulocytes 0.39 (H)  0.00 - 0.07 K/uL  Comprehensive metabolic panel  Result Value Ref Range   Sodium 140 135 - 145 mmol/L   Potassium 4.0 3.5 - 5.1 mmol/L   Chloride 109 98 - 111 mmol/L   CO2 25 22 - 32 mmol/L   Glucose, Bld 132 (H) 70 - 99 mg/dL   BUN 11 8 - 23 mg/dL   Creatinine, Ser 0.99 0.61 - 1.24 mg/dL   Calcium 8.4 (L) 8.9 - 10.3 mg/dL   Total Protein 5.9 (L) 6.5 - 8.1 g/dL   Albumin 4.1 3.5 - 5.0 g/dL   AST 15 15 - 41 U/L   ALT 13 0 - 44 U/L   Alkaline Phosphatase 76 38 - 126 U/L   Total Bilirubin 0.8 0.3 - 1.2 mg/dL   GFR, Estimated >60 >60 mL/min   Anion gap 6 5 - 15  Brain natriuretic peptide  Result Value Ref Range   B Natriuretic Peptide 146.8 (H) 0.0 - 100.0 pg/mL  Lactic acid, plasma  Result Value Ref  Range   Lactic Acid, Venous 2.1 (HH) 0.5 - 1.9 mmol/L  Lactic acid, plasma  Result Value Ref Range   Lactic Acid, Venous 1.4 0.5 - 1.9 mmol/L  Urinalysis, Routine w reflex microscopic Urine, In & Out Cath  Result Value Ref Range   Color, Urine RED (A) YELLOW   APPearance CLOUDY (A) CLEAR   Specific Gravity, Urine  1.005 - 1.030    TEST NOT REPORTED DUE TO COLOR INTERFERENCE OF URINE PIGMENT   pH  5.0 - 8.0    TEST NOT REPORTED DUE TO COLOR INTERFERENCE OF URINE PIGMENT   Glucose, UA (A) NEGATIVE mg/dL    TEST NOT REPORTED DUE TO COLOR INTERFERENCE OF URINE PIGMENT   Hgb urine dipstick (A) NEGATIVE    TEST NOT REPORTED DUE TO COLOR INTERFERENCE OF URINE PIGMENT   Bilirubin Urine (A) NEGATIVE    TEST NOT REPORTED DUE TO COLOR INTERFERENCE OF URINE PIGMENT   Ketones, ur (A) NEGATIVE mg/dL    TEST NOT REPORTED DUE TO COLOR INTERFERENCE OF URINE PIGMENT   Protein, ur (A) NEGATIVE mg/dL    TEST NOT REPORTED DUE TO COLOR INTERFERENCE OF URINE PIGMENT   Nitrite (A) NEGATIVE    TEST NOT REPORTED DUE TO COLOR INTERFERENCE OF URINE PIGMENT   Leukocytes,Ua (A) NEGATIVE    TEST NOT REPORTED DUE TO COLOR INTERFERENCE OF URINE PIGMENT  Blood gas, venous  Result Value Ref Range   pH, Ven 7.27 7.25 - 7.43   pCO2, Ven 65 (H) 44 - 60 mmHg   pO2, Ven 55 (H) 32 - 45 mmHg   Bicarbonate 29.8 (H) 20.0 - 28.0 mmol/L   Acid-Base Excess 1.0 0.0 - 2.0 mmol/L   O2 Saturation 86.8 %   Patient temperature 37.0   Urine rapid drug screen (hosp performed)  Result Value Ref Range   Opiates POSITIVE (A) NONE DETECTED   Cocaine NONE DETECTED NONE DETECTED   Benzodiazepines NONE DETECTED NONE DETECTED   Amphetamines NONE DETECTED NONE DETECTED   Tetrahydrocannabinol NONE DETECTED NONE DETECTED   Barbiturates NONE DETECTED NONE DETECTED  Basic metabolic panel  Result Value Ref Range   Sodium 142 135 - 145 mmol/L   Potassium 4.4 3.5 - 5.1 mmol/L   Chloride 112 (H) 98 - 111 mmol/L   CO2 25 22 - 32 mmol/L    Glucose, Bld 149 (H) 70 - 99 mg/dL   BUN 17 8 - 23 mg/dL   Creatinine, Ser  1.30 (H) 0.61 - 1.24 mg/dL   Calcium 8.3 (L) 8.9 - 10.3 mg/dL   GFR, Estimated 54 (L) >60 mL/min   Anion gap 5 5 - 15  Lactic acid, plasma  Result Value Ref Range   Lactic Acid, Venous 1.6 0.5 - 1.9 mmol/L  TSH  Result Value Ref Range   TSH 0.300 (L) 0.350 - 4.500 uIU/mL  Magnesium  Result Value Ref Range   Magnesium 1.9 1.7 - 2.4 mg/dL  Phosphorus  Result Value Ref Range   Phosphorus 3.4 2.5 - 4.6 mg/dL  Urinalysis, Microscopic (reflex)  Result Value Ref Range   RBC / HPF 21-50 0 - 5 RBC/hpf   WBC, UA 0-5 0 - 5 WBC/hpf   Bacteria, UA RARE (A) NONE SEEN   Squamous Epithelial / LPF NONE SEEN 0 - 5  CBC  Result Value Ref Range   WBC 51.5 (HH) 4.0 - 10.5 K/uL   RBC 4.70 4.22 - 5.81 MIL/uL   Hemoglobin 12.8 (L) 13.0 - 17.0 g/dL   HCT 41.7 39.0 - 52.0 %   MCV 88.7 80.0 - 100.0 fL   MCH 27.2 26.0 - 34.0 pg   MCHC 30.7 30.0 - 36.0 g/dL   RDW 15.7 (H) 11.5 - 15.5 %   Platelets 55 (L) 150 - 400 K/uL   nRBC 0.0 0.0 - 0.2 %  Glucose, capillary  Result Value Ref Range   Glucose-Capillary 160 (H) 70 - 99 mg/dL   Comment 1 Notify RN   CBC with Differential/Platelet  Result Value Ref Range   WBC 23.7 (H) 4.0 - 10.5 K/uL   RBC 3.80 (L) 4.22 - 5.81 MIL/uL   Hemoglobin 10.5 (L) 13.0 - 17.0 g/dL   HCT 33.1 (L) 39.0 - 52.0 %   MCV 87.1 80.0 - 100.0 fL   MCH 27.6 26.0 - 34.0 pg   MCHC 31.7 30.0 - 36.0 g/dL   RDW 15.6 (H) 11.5 - 15.5 %   Platelets 41 (L) 150 - 400 K/uL   nRBC 0.0 0.0 - 0.2 %   Neutrophils Relative % 75 %   Neutro Abs 17.9 (H) 1.7 - 7.7 K/uL   Lymphocytes Relative 4 %   Lymphs Abs 0.9 0.7 - 4.0 K/uL   Monocytes Relative 18 %   Monocytes Absolute 4.3 (H) 0.1 - 1.0 K/uL   Eosinophils Relative 0 %   Eosinophils Absolute 0.0 0.0 - 0.5 K/uL   Basophils Relative 0 %   Basophils Absolute 0.0 0.0 - 0.1 K/uL   Immature Granulocytes 3 %   Abs Immature Granulocytes 0.63 (H) 0.00 - 0.07 K/uL   Basic metabolic panel  Result Value Ref Range   Sodium 140 135 - 145 mmol/L   Potassium 3.7 3.5 - 5.1 mmol/L   Chloride 114 (H) 98 - 111 mmol/L   CO2 23 22 - 32 mmol/L   Glucose, Bld 129 (H) 70 - 99 mg/dL   BUN 18 8 - 23 mg/dL   Creatinine, Ser 0.83 0.61 - 1.24 mg/dL   Calcium 7.6 (L) 8.9 - 10.3 mg/dL   GFR, Estimated >60 >60 mL/min   Anion gap 3 (L) 5 - 15  Glucose, capillary  Result Value Ref Range   Glucose-Capillary 146 (H) 70 - 99 mg/dL   Comment 1 Notify RN    Comment 2 Document in Chart   Glucose, capillary  Result Value Ref Range   Glucose-Capillary 136 (H) 70 - 99 mg/dL  Glucose, capillary  Result Value Ref Range  Glucose-Capillary 132 (H) 70 - 99 mg/dL   Comment 1 Notify RN    Comment 2 Document in Chart   Glucose, capillary  Result Value Ref Range   Glucose-Capillary 113 (H) 70 - 99 mg/dL   Comment 1 Notify RN   Glucose, capillary  Result Value Ref Range   Glucose-Capillary 112 (H) 70 - 99 mg/dL  Glucose, capillary  Result Value Ref Range   Glucose-Capillary 97 70 - 99 mg/dL  CBC with Differential/Platelet  Result Value Ref Range   WBC 13.8 (H) 4.0 - 10.5 K/uL   RBC 3.88 (L) 4.22 - 5.81 MIL/uL   Hemoglobin 10.5 (L) 13.0 - 17.0 g/dL   HCT 33.7 (L) 39.0 - 52.0 %   MCV 86.9 80.0 - 100.0 fL   MCH 27.1 26.0 - 34.0 pg   MCHC 31.2 30.0 - 36.0 g/dL   RDW 15.4 11.5 - 15.5 %   Platelets 36 (L) 150 - 400 K/uL   nRBC 0.0 0.0 - 0.2 %   Neutrophils Relative % 70 %   Neutro Abs 9.5 (H) 1.7 - 7.7 K/uL   Lymphocytes Relative 5 %   Lymphs Abs 0.7 0.7 - 4.0 K/uL   Monocytes Relative 20 %   Monocytes Absolute 2.7 (H) 0.1 - 1.0 K/uL   Eosinophils Relative 0 %   Eosinophils Absolute 0.0 0.0 - 0.5 K/uL   Basophils Relative 0 %   Basophils Absolute 0.1 0.0 - 0.1 K/uL   Immature Granulocytes 5 %   Abs Immature Granulocytes 0.71 (H) 0.00 - 0.07 K/uL  Basic metabolic panel  Result Value Ref Range   Sodium 143 135 - 145 mmol/L   Potassium 3.5 3.5 - 5.1 mmol/L    Chloride 120 (H) 98 - 111 mmol/L   CO2 21 (L) 22 - 32 mmol/L   Glucose, Bld 101 (H) 70 - 99 mg/dL   BUN 11 8 - 23 mg/dL   Creatinine, Ser 0.73 0.61 - 1.24 mg/dL   Calcium 7.6 (L) 8.9 - 10.3 mg/dL   GFR, Estimated >60 >60 mL/min   Anion gap 2 (L) 5 - 15  Glucose, capillary  Result Value Ref Range   Glucose-Capillary 126 (H) 70 - 99 mg/dL  Glucose, capillary  Result Value Ref Range   Glucose-Capillary 103 (H) 70 - 99 mg/dL  Glucose, capillary  Result Value Ref Range   Glucose-Capillary 108 (H) 70 - 99 mg/dL  Glucose, capillary  Result Value Ref Range   Glucose-Capillary 103 (H) 70 - 99 mg/dL  Glucose, capillary  Result Value Ref Range   Glucose-Capillary 102 (H) 70 - 99 mg/dL  Glucose, capillary  Result Value Ref Range   Glucose-Capillary 92 70 - 99 mg/dL  CBC with Differential/Platelet  Result Value Ref Range   WBC 14.9 (H) 4.0 - 10.5 K/uL   RBC 4.30 4.22 - 5.81 MIL/uL   Hemoglobin 11.6 (L) 13.0 - 17.0 g/dL   HCT 36.9 (L) 39.0 - 52.0 %   MCV 85.8 80.0 - 100.0 fL   MCH 27.0 26.0 - 34.0 pg   MCHC 31.4 30.0 - 36.0 g/dL   RDW 15.2 11.5 - 15.5 %   Platelets 44 (L) 150 - 400 K/uL   nRBC 0.0 0.0 - 0.2 %   Neutrophils Relative % 67 %   Neutro Abs 10.2 (H) 1.7 - 7.7 K/uL   Lymphocytes Relative 6 %   Lymphs Abs 0.8 0.7 - 4.0 K/uL   Monocytes Relative 17 %   Monocytes Absolute  2.5 (H) 0.1 - 1.0 K/uL   Eosinophils Relative 1 %   Eosinophils Absolute 0.1 0.0 - 0.5 K/uL   Basophils Relative 1 %   Basophils Absolute 0.1 0.0 - 0.1 K/uL   Immature Granulocytes 8 %   Abs Immature Granulocytes 1.20 (H) 0.00 - 0.07 K/uL  Basic metabolic panel  Result Value Ref Range   Sodium 140 135 - 145 mmol/L   Potassium 3.4 (L) 3.5 - 5.1 mmol/L   Chloride 115 (H) 98 - 111 mmol/L   CO2 20 (L) 22 - 32 mmol/L   Glucose, Bld 95 70 - 99 mg/dL   BUN 9 8 - 23 mg/dL   Creatinine, Ser 0.71 0.61 - 1.24 mg/dL   Calcium 8.1 (L) 8.9 - 10.3 mg/dL   GFR, Estimated >60 >60 mL/min   Anion gap 5 5 - 15   Glucose, capillary  Result Value Ref Range   Glucose-Capillary 132 (H) 70 - 99 mg/dL  Glucose, capillary  Result Value Ref Range   Glucose-Capillary 91 70 - 99 mg/dL  Glucose, capillary  Result Value Ref Range   Glucose-Capillary 96 70 - 99 mg/dL  Glucose, capillary  Result Value Ref Range   Glucose-Capillary 96 70 - 99 mg/dL  Glucose, capillary  Result Value Ref Range   Glucose-Capillary 108 (H) 70 - 99 mg/dL  Glucose, capillary  Result Value Ref Range   Glucose-Capillary 108 (H) 70 - 99 mg/dL  Basic metabolic panel  Result Value Ref Range   Sodium 143 135 - 145 mmol/L   Potassium 3.5 3.5 - 5.1 mmol/L   Chloride 116 (H) 98 - 111 mmol/L   CO2 23 22 - 32 mmol/L   Glucose, Bld 105 (H) 70 - 99 mg/dL   BUN 8 8 - 23 mg/dL   Creatinine, Ser 0.69 0.61 - 1.24 mg/dL   Calcium 8.2 (L) 8.9 - 10.3 mg/dL   GFR, Estimated >60 >60 mL/min   Anion gap 4 (L) 5 - 15  CBC  Result Value Ref Range   WBC 15.5 (H) 4.0 - 10.5 K/uL   RBC 4.47 4.22 - 5.81 MIL/uL   Hemoglobin 12.0 (L) 13.0 - 17.0 g/dL   HCT 39.2 39.0 - 52.0 %   MCV 87.7 80.0 - 100.0 fL   MCH 26.8 26.0 - 34.0 pg   MCHC 30.6 30.0 - 36.0 g/dL   RDW 15.3 11.5 - 15.5 %   Platelets 54 (L) 150 - 400 K/uL   nRBC 0.0 0.0 - 0.2 %  Magnesium  Result Value Ref Range   Magnesium 1.9 1.7 - 2.4 mg/dL  Glucose, capillary  Result Value Ref Range   Glucose-Capillary 125 (H) 70 - 99 mg/dL  Glucose, capillary  Result Value Ref Range   Glucose-Capillary 135 (H) 70 - 99 mg/dL  Glucose, capillary  Result Value Ref Range   Glucose-Capillary 104 (H) 70 - 99 mg/dL   Comment 1 Notify RN    Comment 2 Document in Chart   Glucose, capillary  Result Value Ref Range   Glucose-Capillary 100 (H) 70 - 99 mg/dL  Glucose, capillary  Result Value Ref Range   Glucose-Capillary 120 (H) 70 - 99 mg/dL  Glucose, capillary  Result Value Ref Range   Glucose-Capillary 100 (H) 70 - 99 mg/dL  Glucose, capillary  Result Value Ref Range   Glucose-Capillary  163 (H) 70 - 99 mg/dL  Glucose, capillary  Result Value Ref Range   Glucose-Capillary 108 (H) 70 - 99 mg/dL  Glucose,  capillary  Result Value Ref Range   Glucose-Capillary 107 (H) 70 - 99 mg/dL  Glucose, capillary  Result Value Ref Range   Glucose-Capillary 101 (H) 70 - 99 mg/dL  Glucose, capillary  Result Value Ref Range   Glucose-Capillary 128 (H) 70 - 99 mg/dL  Glucose, capillary  Result Value Ref Range   Glucose-Capillary 121 (H) 70 - 99 mg/dL  Glucose, capillary  Result Value Ref Range   Glucose-Capillary 128 (H) 70 - 99 mg/dL   Comment 1 Notify RN    Comment 2 Document in Chart   Glucose, capillary  Result Value Ref Range   Glucose-Capillary 117 (H) 70 - 99 mg/dL   Comment 1 Notify RN    Comment 2 Document in Chart   Glucose, capillary  Result Value Ref Range   Glucose-Capillary 102 (H) 70 - 99 mg/dL   Comment 1 Notify RN    Comment 2 Document in Chart   Glucose, capillary  Result Value Ref Range   Glucose-Capillary 91 70 - 99 mg/dL  Glucose, capillary  Result Value Ref Range   Glucose-Capillary 136 (H) 70 - 99 mg/dL  Glucose, capillary  Result Value Ref Range   Glucose-Capillary 117 (H) 70 - 99 mg/dL  Glucose, capillary  Result Value Ref Range   Glucose-Capillary 116 (H) 70 - 99 mg/dL  Glucose, capillary  Result Value Ref Range   Glucose-Capillary 100 (H) 70 - 99 mg/dL  Glucose, capillary  Result Value Ref Range   Glucose-Capillary 97 70 - 99 mg/dL  Glucose, capillary  Result Value Ref Range   Glucose-Capillary 99 70 - 99 mg/dL  Glucose, capillary  Result Value Ref Range   Glucose-Capillary 108 (H) 70 - 99 mg/dL  Glucose, capillary  Result Value Ref Range   Glucose-Capillary 111 (H) 70 - 99 mg/dL  Glucose, capillary  Result Value Ref Range   Glucose-Capillary 124 (H) 70 - 99 mg/dL  Glucose, capillary  Result Value Ref Range   Glucose-Capillary 145 (H) 70 - 99 mg/dL  Glucose, capillary  Result Value Ref Range   Glucose-Capillary 88 70 - 99 mg/dL   Glucose, capillary  Result Value Ref Range   Glucose-Capillary 97 70 - 99 mg/dL   Comment 1 Notify RN   Glucose, capillary  Result Value Ref Range   Glucose-Capillary 127 (H) 70 - 99 mg/dL   Comment 1 Notify RN   Glucose, capillary  Result Value Ref Range   Glucose-Capillary 165 (H) 70 - 99 mg/dL   Comment 1 Notify RN   Glucose, capillary  Result Value Ref Range   Glucose-Capillary 136 (H) 70 - 99 mg/dL   Comment 1 Notify RN    Comment 2 Document in Chart   Glucose, capillary  Result Value Ref Range   Glucose-Capillary 116 (H) 70 - 99 mg/dL   Comment 1 Notify RN    Comment 2 Document in Chart   Glucose, capillary  Result Value Ref Range   Glucose-Capillary 110 (H) 70 - 99 mg/dL   Comment 1 Notify RN    Comment 2 Document in Chart   Glucose, capillary  Result Value Ref Range   Glucose-Capillary 103 (H) 70 - 99 mg/dL  CBG monitoring, ED  Result Value Ref Range   Glucose-Capillary 122 (H) 70 - 99 mg/dL  CBG monitoring, ED  Result Value Ref Range   Glucose-Capillary 125 (H) 70 - 99 mg/dL  Troponin I (High Sensitivity)  Result Value Ref Range   Troponin I (High Sensitivity) 4 <  18 ng/L  Troponin I (High Sensitivity)  Result Value Ref Range   Troponin I (High Sensitivity) 4 <18 ng/L     COVID 19 screen:  No recent travel or known exposure to COVID19 The patient denies respiratory symptoms of COVID 19 at this time. The importance of social distancing was discussed today.   Assessment and Plan    Problem List Items Addressed This Visit     Thrombocytopenia ([place]) (Chronic)    Chronic, followed by oncology  Recent platelet testing and hospitalization was improved from baseline at 90      Accidental fall    Reviewed necessary walker use.  Reviewed fall safety at home.  No preceding symptoms.  No additional work-up needed.      Head trauma    Acute, normal neuro exam and no headaches.  Mild abrasion healing well.  No indication for head CT.  Reviewed ER and  return precautions for potential subacute intracranial bleeding in detail with daughter.      Laceration of left hand without foreign body - Primary    Acute, area cleaned with warm soapy water gauze stuck on removed.  Appears to be healing with granulation tissue, no ongoing infection.  Recommended daily washing with warm soapy water nonstick dressing and Neosporin.      MDD (major depressive disorder), recurrent episode, severe (HCC)    Chronic, poor control with recent suicide attempt.  They are waiting on referral to geriatric psychiatry.      Rash    Acute, new changes Status post doxycycline treatment for possible tick borne illness.  Rash is very itchy and given his thrombocytopenia the skin changes are likely secondary to rubbing resulting in microscopic injury to vessels and small bleeds.  Will treat with topical triamcinolone cream to help with itching.        Eliezer Lofts, MD

## 2021-10-02 NOTE — Assessment & Plan Note (Signed)
Acute, new changes Status post doxycycline treatment for possible tick borne illness.  Rash is very itchy and given his thrombocytopenia the skin changes are likely secondary to rubbing resulting in microscopic injury to vessels and small bleeds.  Will treat with topical triamcinolone cream to help with itching.

## 2021-10-09 ENCOUNTER — Other Ambulatory Visit: Payer: Self-pay | Admitting: Primary Care

## 2021-10-09 DIAGNOSIS — N401 Enlarged prostate with lower urinary tract symptoms: Secondary | ICD-10-CM

## 2021-10-14 ENCOUNTER — Encounter: Payer: Self-pay | Admitting: Primary Care

## 2021-10-14 ENCOUNTER — Ambulatory Visit (INDEPENDENT_AMBULATORY_CARE_PROVIDER_SITE_OTHER): Payer: Medicare HMO | Admitting: Primary Care

## 2021-10-14 VITALS — BP 110/70 | HR 82 | Temp 97.3°F | Ht 60.0 in | Wt 136.0 lb

## 2021-10-14 DIAGNOSIS — D696 Thrombocytopenia, unspecified: Secondary | ICD-10-CM | POA: Diagnosis not present

## 2021-10-14 DIAGNOSIS — G8929 Other chronic pain: Secondary | ICD-10-CM | POA: Diagnosis not present

## 2021-10-14 DIAGNOSIS — R296 Repeated falls: Secondary | ICD-10-CM

## 2021-10-14 DIAGNOSIS — R21 Rash and other nonspecific skin eruption: Secondary | ICD-10-CM | POA: Diagnosis not present

## 2021-10-14 DIAGNOSIS — F332 Major depressive disorder, recurrent severe without psychotic features: Secondary | ICD-10-CM | POA: Diagnosis not present

## 2021-10-14 DIAGNOSIS — Z23 Encounter for immunization: Secondary | ICD-10-CM | POA: Diagnosis not present

## 2021-10-14 DIAGNOSIS — M25511 Pain in right shoulder: Secondary | ICD-10-CM | POA: Diagnosis not present

## 2021-10-14 DIAGNOSIS — M25512 Pain in left shoulder: Secondary | ICD-10-CM

## 2021-10-14 DIAGNOSIS — R338 Other retention of urine: Secondary | ICD-10-CM

## 2021-10-14 DIAGNOSIS — N401 Enlarged prostate with lower urinary tract symptoms: Secondary | ICD-10-CM | POA: Diagnosis not present

## 2021-10-14 MED ORDER — FAMOTIDINE 20 MG PO TABS: 20.0000 mg | ORAL_TABLET | Freq: Every day | ORAL | 0 refills | Status: DC

## 2021-10-14 NOTE — Assessment & Plan Note (Addendum)
Dry skin noted.  Petechial rash secondary to scratching/thrombocytopenia.  Continue Claritin 10 mg daily. Add Pepcid 20 mg daily.  Continue with daily moisturizers.  See attached pictures

## 2021-10-14 NOTE — Assessment & Plan Note (Signed)
Improving.  Continue gabapentin 200 mg twice daily. Discontinue meloxicam 7.5 mg daily given history of thrombocytopenia and widespread petechial rash.  Discussed with daughter and patient today.

## 2021-10-14 NOTE — Assessment & Plan Note (Signed)
Following with hematology.  Suspect petechial rash secondary to thrombocytopenia from scratching. We will CC hematology/oncology provider as FYI.  See pictures attached.  Repeat labs pending in October.

## 2021-10-14 NOTE — Patient Instructions (Addendum)
Start famotidine 20 mg daily for itching. Take with the Claritin.  Follow up with psychiatry next week.   Continue gabapentin 200 mg twice daily for shoulder pain.  It was a pleasure to see you today!

## 2021-10-14 NOTE — Progress Notes (Signed)
Subjective:    Patient ID: [name], male    DOB: 03/02/1933, 88 y.o.   MRN: [id number]  HPI  [name] is a very pleasant 88 y.o. male with a history of major depressive disorder, suicide attempt, toxic metabolic encephalopathy, BPH, thrombocytopenia, newly diagnosed leukemia, dementia who presents today for hospital follow-up. His daughter joins [place] today.   He presented to [facility] long ED on 09/04/2021 with his daughter for symptoms of lethargy and somnolence.  After further discussion with his family intentional overdose with Dilaudid was suspected.  He was admitted for further evaluation and treatment.  During his hospital stay he underwent MRI brain which was without acute intracranial abnormality.  Foley catheter was placed for urinary retention.  He was initiated on finasteride 5 mg daily in addition to his tamsulosin 0.8 mg daily for urinary retention.  He was discharged on 09/17/2021 [facility] facility.  Admitted to [facility] inpatient unit.  Initiated on Zyprexa 5 mg daily for depression and negative thinking, his Zoloft was increased to 100 mg daily.  His gabapentin was increased to 200 mg twice daily due to anxiety and shoulder pain.  He was initiated on meloxicam 7.5 mg daily for shoulder pain.  He was discharged home on 09/23/2021 as he was no longer hallucinating, returned back to baseline.  He was tolerating his medications without difficulty.  Since his discharge home he's fallen, hit his forehead on the wall, denied LOC or headaches. His daughter points to an open skin tear to his left second metacarpal joint that has been present since his hospitalization at [facility]. His home health nurse is coming out today. He has an appointment with psychiatry next week.  He is compliant to his medications as prescribed. His shoulder pain has improved some, especially to his left side. He continues to feel depressed, "I just want to disappear". " I was inturrupted in doing something  that was working well." He has noticed an improvement in urination. Today his daughter found a wide spread area of red spots to his entire back for which she suspects to be secondary to scratching from his skin dryness.  He is due for repeat labs through his hematologist in 2 weeks.   Review of Systems  Respiratory:  Negative for shortness of breath.   Cardiovascular:  Negative for chest pain.  Genitourinary:  Negative for difficulty urinating.  Skin:  Positive for color change, rash and wound.  Neurological:  Positive for dizziness.  Psychiatric/Behavioral:         See HPI         Past Medical History:  Diagnosis Date   Arthralgia of multiple joints    limited mobility w/  independant adl's   BPH with obstruction/lower urinary tract symptoms    Chronic idiopathic monocytosis    followed by dr Waymon Budge--  per lov note 06/ 2017 persistant unexplained  with normal bone barrow bx   Coronary atherosclerosis of unspecified type of vessel, native or graft    cardiologist-  dr Stanford Breed -- per lov note 11-19-2014 ,  cardiac cath in 2000-- pLAD 60-70%,  small intermediate branch 70-80%,  mRCA 20%,  normal LV   Degenerative arthritis of spine    cervical and lumar   Diverticulosis of colon (without mention of hemorrhage)    Dysuria    chronic   Elevated PSA    Feeling of incomplete bladder emptying    Hiatal hernia    moderate per ct 11/  2017   History of adenomatous polyp of colon    2008   History of atrial fibrillation    remote hx episode   History of COVID-19 03/05/2020   History of pancreatitis    07-01-2013  and 10-10-2015   History of prostatitis    2014   Hx of colonic polyps    Hyperlipidemia    Hypertension    Mouth sore    roof of mouth sore    Nephrolithiasis    Nocturia more than twice per night    severe  w/ leakage   OSA (obstructive sleep apnea)    INTOLERANT CPAP   Osteoarthritis    Pernicious anemia    B12  Def.   Peyronie disease    Renal  insufficiency    Sepsis ([place]) 03/22/2017   Thrombocytopenia, unspecified ([place]) hemotology/oncologist-  dr Waymon Budge   per dr Velta Addison note 06/ 2016  secondary to vitro clumping   Vitamin B deficiency     Social History   Socioeconomic History   Marital status: Widowed    Spouse name: Not on file   Number of children: 2   Years of education: Not on file   Highest education level: Not on file  Occupational History   Occupation: retired    Fish farm manager: RETIRED  Tobacco Use   Smoking status: Former    Types: Pipe    Quit date: 01/18/1966    Years since quitting: 55.7   Smokeless tobacco: Never  Vaping Use   Vaping Use: Never used  Substance and Sexual Activity   Alcohol use: No    Alcohol/week: 0.0 standard drinks of alcohol   Drug use: No   Sexual activity: Not Currently  Other Topics Concern   Not on file  Social History Narrative   DNR   Widowed   2 daughters leave near by   Social Determinants of Health   Financial Resource Strain: Low Risk  (08/21/2021)   Overall Financial Resource Strain (CARDIA)    Difficulty of Paying Living Expenses: Not hard at all  Food Insecurity: No Food Insecurity (08/21/2021)   Hunger Vital Sign    Worried About Running Out of Food in the Last Year: Never true    [place] in the Last Year: Never true  Transportation Needs: No Transportation Needs (08/21/2021)   PRAPARE - Hydrologist (Medical): No    Lack of Transportation (Non-Medical): No  Physical Activity: Inactive (08/21/2021)   Exercise Vital Sign    Days of Exercise per Week: 0 days    Minutes of Exercise per Session: 0 min  Stress: Stress Concern Present (08/21/2021)   [place]    Feeling of Stress : To some extent  Social Connections: Not on file  Intimate Partner Violence: Not At Risk (09/23/2021)   Humiliation, Afraid, Rape, and Kick questionnaire    Fear of Current or  Ex-Partner: No    Emotionally Abused: No    Physically Abused: No    Sexually Abused: No    Past Surgical History:  Procedure Laterality Date   BONE MARROW BIOPSY  2011   normal   CARDIAC CATHETERIZATION  2000   per dr Kathyrn Drown note --  dLAD 60-70%,  small intermediate branch 70-80%,  mRCA 20%,  normal LV   CARDIOVASCULAR STRESS TEST  01-29-2013   dr Stanford Breed   normal nuclear study w/ no ischemia,  normal LV function  and wall motion , ef 82%   CATARACT EXTRACTION W/ INTRAOCULAR LENS  IMPLANT, BILATERAL  08/2015   CHOLECYSTECTOMY  11/24/2010   Procedure: LAPAROSCOPIC CHOLECYSTECTOMY WITH INTRAOPERATIVE CHOLANGIOGRAM;  Surgeon: Earnstine Regal, MD;  Location: [facility];  Service: General;  Laterality: N/A;  c-arm   CYSTOSCOPY W/ URETERAL STENT PLACEMENT Left 12/17/2015   Procedure: CYSTOSCOPY WITH RETROGRADE PYELOGRAM/URETERAL STENT PLACEMENT;  Surgeon: Ardis Hughs, MD;  Location: [facility];  Service: Urology;  Laterality: Left;   CYSTOSCOPY WITH RETROGRADE PYELOGRAM, URETEROSCOPY AND STENT PLACEMENT Left 12/22/2015   Procedure: CYSTOSCOPY WITH LEFT RETROGRADE  URETEROSCOPY AND STENT PLACEMENT;  Surgeon: Carolan Clines, MD;  Location: [facility];  Service: Urology;  Laterality: Left;   ERCP N/A 07/04/2013   Procedure: ENDOSCOPIC RETROGRADE CHOLANGIOPANCREATOGRAPHY (ERCP);  Surgeon: Gatha Mayer, MD;  Location: [facility];  Service: Endoscopy;  Laterality: N/A;  MAC if available   ERCP N/A 10/12/2015   Procedure: ENDOSCOPIC RETROGRADE CHOLANGIOPANCREATOGRAPHY (ERCP);  Surgeon: [name], MD;  Location: [facility];  Service: Endoscopy;  Laterality: N/A;   HOLMIUM LASER APPLICATION Left 79/0/2409   Procedure: HOLMIUM LASER APPLICATION;  Surgeon: Carolan Clines, MD;  Location: [facility];  Service: Urology;  Laterality: Left;   INGUINAL HERNIA REPAIR Right 01/25/2002   KNEE ARTHROSCOPY  x5   SATURATION BIOPSY OF PROSTATE  05-29-2007  and 01-26-2008    SHOULDER ARTHROSCOPY WITH OPEN ROTATOR CUFF REPAIR AND DISTAL CLAVICLE ACROMINECTOMY Right 11/19/2004   TOTAL HIP ARTHROPLASTY Right 05/21/2009   TOTAL KNEE ARTHROPLASTY Bilateral left 05-02-2003/  right  03-23-2010   TRANSTHORACIC ECHOCARDIOGRAM  12/23/2004   ef 60%, mild MV calcification without stenosis/  mild TR,  PASP 59mHg   UVULOPALATOPHARYNGOPLASTY  1993    w/  T & A    Family History  Problem Relation Age of Onset   Peripheral vascular disease Mother    Colon cancer Father [id number]  Breast cancer Daughter    Breast cancer Daughter     Allergies  Allergen Reactions   Penicillins Hives and Other (See Comments)    [place], passed out Tolerates cephalosporins  Has patient had a PCN reaction causing immediate rash, facial/tongue/throat swelling, SOB or lightheadedness with hypotension:  yes Has patient had a PCN reaction causing severe rash involving mucus membranes or skin necrosis: no Has patient had a PCN reaction that required hospitalization: no Has patient had a PCN reaction occurring within the last 10 years: no If all of the above answers are "NO", then may proceed with Cephalosporin use.    Tape Hives and Other (See Comments)    Paper Tape    Current Outpatient Medications on File Prior to Visit  Medication Sig Dispense Refill   Artificial Tear Solution (TEARS NATURALE OP) Place 1-2 drops into both eyes at bedtime as needed (dry eyes).     diclofenac Sodium (VOLTAREN ARTHRITIS PAIN) 1 % GEL Apply 2 g topically 3 (three) times daily as needed. (Patient taking differently: Apply 2 g topically 3 (three) times daily as needed (left shoulder and arm pain).) 100 g 3   Ensure (ENSURE) Take 237 mLs by mouth daily. Chocolate     finasteride (PROSCAR) 5 MG tablet Take 1 tablet (5 mg total) by mouth daily. For urine flow 90 tablet 0   fluticasone (FLONASE) 50 MCG/ACT nasal spray Place 1 spray into both nostrils 2 (two) times daily. 48 g 3   gabapentin (NEURONTIN) 100 MG  capsule Take 2 capsules (200 mg  total) by mouth 2 (two) times daily. 120 capsule 3   GEMTESA 75 MG TABS Take 1 tablet by mouth daily.     loratadine (CLARITIN) 10 MG tablet Take 1 tablet (10 mg total) by mouth daily. For allergies 90 tablet 3   meloxicam (MOBIC) 7.5 MG tablet Take 1 tablet (7.5 mg total) by mouth daily after breakfast. 30 tablet 3   OLANZapine (ZYPREXA) 5 MG tablet Take 1 tablet (5 mg total) by mouth at bedtime. 30 tablet 3   senna-docusate (SENOKOT-S) 8.6-50 MG tablet Take 1 tablet by mouth at bedtime. 30 tablet 3   sertraline (ZOLOFT) 100 MG tablet Take 1 tablet (100 mg total) by mouth daily. 30 tablet 3   tamsulosin (FLOMAX) 0.4 MG CAPS capsule TAKE 2 CAPSULES (0.8 MG TOTAL) BY MOUTH DAILY. FOR URINE FLOW 180 capsule 0   triamcinolone cream (KENALOG) 0.5 % Apply 1 Application topically 2 (two) times daily. 30 g 0   No current facility-administered medications on file prior to visit.    BP 110/70   Pulse 82   Temp (!) 97.3 F (36.3 C) (Temporal)   Ht 5' (1.524 m)   Wt 136 lb (61.7 kg)   SpO2 98%   BMI 26.56 kg/m  Objective:   Physical Exam Cardiovascular:     Rate and Rhythm: Normal rate and regular rhythm.  Pulmonary:     Effort: Pulmonary effort is normal.     Breath sounds: Normal breath sounds. No wheezing or rales.  Musculoskeletal:     Cervical back: Neck supple.  Skin:    General: Skin is warm and dry.     Comments: Open skin tear to left second metacarpal joint without surrounding erythema.  Measures approximately 1 cm in diameter.  Widespread petechial rash to entire posterior trunk, more concentrated to lumbar region.  Petechial rash noted to left medial thigh at site of tick bite  Neurological:     Mental Status: He is alert and oriented to person, place, and time.              Assessment & Plan:   Problem List Items Addressed This Visit   None      Pleas Koch, NP

## 2021-10-14 NOTE — Assessment & Plan Note (Signed)
Daughter has scheduled for patient to receive updated hearing aids to see if this helps with balance. Continue with home health physical therapy.

## 2021-10-14 NOTE — Assessment & Plan Note (Signed)
Symptoms improving.  Continue finasteride 5 mg daily, tamsulosin 0.8 mg daily.

## 2021-10-14 NOTE — Assessment & Plan Note (Signed)
Reviewed hospital notes, imaging, labs from hospitalization in August/September 2023.  It is clear that he continues to experience suicidal thoughts but adamantly expresses today that he has no means to carry through with suicide.  Fortunately, his family is very attentive and checks on him frequently.  They have full control of his medications.  Follow-up with psychiatry as scheduled.  Continue Zyprexa 5 mg at bedtime, Zoloft 100 mg daily.

## 2021-10-15 ENCOUNTER — Ambulatory Visit: Payer: Medicare HMO | Admitting: Podiatry

## 2021-10-15 ENCOUNTER — Telehealth: Payer: Self-pay | Admitting: Primary Care

## 2021-10-15 NOTE — Telephone Encounter (Signed)
Approved.  

## 2021-10-15 NOTE — Telephone Encounter (Signed)
Home Health verbal orders Caller Name: [place] Name: [name] [place] number: [phone],   Requesting OT/PT/Skilled nursing/Social Work/Speech: OT   Reason:evaluate & treat   Frequency: once a week for 3 weeks   Please forward to [facility] pool or providers CMA

## 2021-10-15 NOTE — Telephone Encounter (Signed)
Called spoke to [place] verbal orders given will call if any questions.  No further action needed at this time.

## 2021-10-19 DIAGNOSIS — F333 Major depressive disorder, recurrent, severe with psychotic symptoms: Secondary | ICD-10-CM | POA: Diagnosis not present

## 2021-10-22 ENCOUNTER — Telehealth: Payer: Self-pay | Admitting: Primary Care

## 2021-10-22 ENCOUNTER — Encounter: Payer: Self-pay | Admitting: Primary Care

## 2021-10-22 ENCOUNTER — Other Ambulatory Visit (INDEPENDENT_AMBULATORY_CARE_PROVIDER_SITE_OTHER): Payer: Medicare HMO

## 2021-10-22 ENCOUNTER — Ambulatory Visit (INDEPENDENT_AMBULATORY_CARE_PROVIDER_SITE_OTHER): Payer: Medicare HMO | Admitting: Primary Care

## 2021-10-22 ENCOUNTER — Telehealth: Payer: Self-pay

## 2021-10-22 VITALS — BP 118/68 | HR 81 | Temp 97.7°F | Ht 60.0 in | Wt 133.0 lb

## 2021-10-22 DIAGNOSIS — D696 Thrombocytopenia, unspecified: Secondary | ICD-10-CM

## 2021-10-22 DIAGNOSIS — D72821 Monocytosis (symptomatic): Secondary | ICD-10-CM | POA: Diagnosis not present

## 2021-10-22 DIAGNOSIS — D72829 Elevated white blood cell count, unspecified: Secondary | ICD-10-CM

## 2021-10-22 DIAGNOSIS — R21 Rash and other nonspecific skin eruption: Secondary | ICD-10-CM

## 2021-10-22 LAB — CBC
HCT: 39.9 % (ref 39.0–52.0)
Hemoglobin: 12.9 g/dL — ABNORMAL LOW (ref 13.0–17.0)
MCHC: 32.5 g/dL (ref 30.0–36.0)
MCV: 78.1 fl (ref 78.0–100.0)
Platelets: 61 10*3/uL — ABNORMAL LOW (ref 150.0–400.0)
RBC: 5.11 Mil/uL (ref 4.22–5.81)
RDW: 17.2 % — ABNORMAL HIGH (ref 11.5–15.5)
WBC: 21.9 10*3/uL (ref 4.0–10.5)

## 2021-10-22 LAB — WHITE CELL DIFFERENTIAL
Basophils Relative: 0.9 % (ref 0.0–3.0)
Eosinophils Relative: 2.8 % (ref 0.0–5.0)
Lymphocytes Relative: 7.8 % — ABNORMAL LOW (ref 12.0–46.0)
Monocytes Relative: 21.8 % — ABNORMAL HIGH (ref 3.0–12.0)
Neutrophils Relative %: 66.7 % (ref 43.0–77.0)

## 2021-10-22 MED ORDER — PREDNISONE 20 MG PO TABS: ORAL_TABLET | ORAL | 0 refills | Status: DC

## 2021-10-22 NOTE — Progress Notes (Signed)
Subjective:    Patient ID: [name], male    DOB: Jul 12, 1933, 88 y.o.   MRN: [id number]  Rash Pertinent negatives include no fever.    [name] is a very pleasant 88 y.o. male with a history of thrombocytopenia, confusion, hypertension, MDD with attempted suicide, erythema migrans, BPH with urinary retention, chronic shoulder pain, chronic fatigue who presents today to discuss rash.  He was last evaluated one week ago in our office. During this visit he and his daughter described a wide spread rash with itching to his posterior trunk for which she noticed that same day. Upon exam he was noted to have wide spread petechiae and dry skin to his posterior trunk. He was also noted to continue to have a rash to the left medial thigh since the tick bite from July 2023. His Claritin was continued and famotidine was added. Pictures of his petechiae were sent to his hematologist.   Since his last visit the rash to his posterior trunk is still itchy. The petechial rash has now spread to the abdomen and parts of his lower extremities.  His daughter has switched all of his bathing products to mild soaps and he is no longer using dryer sheets.  The only changes to his medications have been the introduction of finasteride and Zyprexa during his hospital stay in August/September 2023.  His daughter has now noticed a knot to the site of the tick bite on his left medial thigh.  He is taking his Claritin and famotidine without improvement in itching.  He denies fevers, increased joint aches, appetite change.   Review of Systems  Constitutional:  Negative for appetite change, chills and fever.  Skin:  Positive for color change and rash.  Neurological:  Negative for headaches.         Past Medical History:  Diagnosis Date   Arthralgia of multiple joints    limited mobility w/  independant adl's   BPH with obstruction/lower urinary tract symptoms    Chronic idiopathic monocytosis     followed by dr Waymon Budge--  per lov note 06/ 2017 persistant unexplained  with normal bone barrow bx   Coronary atherosclerosis of unspecified type of vessel, native or graft    cardiologist-  dr Stanford Breed -- per lov note 11-19-2014 ,  cardiac cath in 2000-- pLAD 60-70%,  small intermediate branch 70-80%,  mRCA 20%,  normal LV   Degenerative arthritis of spine    cervical and lumar   Diverticulosis of colon (without mention of hemorrhage)    Dysuria    chronic   Elevated PSA    Feeling of incomplete bladder emptying    Hiatal hernia    moderate per ct 11/ 2017   History of adenomatous polyp of colon    2008   History of atrial fibrillation    remote hx episode   History of COVID-19 03/05/2020   History of pancreatitis    07-01-2013  and 10-10-2015   History of prostatitis    2014   Hx of colonic polyps    Hyperlipidemia    Hypertension    Mouth sore    roof of mouth sore    Nephrolithiasis    Nocturia more than twice per night    severe  w/ leakage   OSA (obstructive sleep apnea)    INTOLERANT CPAP   Osteoarthritis    Pernicious anemia    B12  Def.   Peyronie disease    Renal insufficiency  Sepsis ([place]) 03/22/2017   Thrombocytopenia, unspecified ([place]) hemotology/oncologist-  dr Waymon Budge   per dr Velta Addison note 06/ 2016  secondary to vitro clumping   Vitamin B deficiency     Social History   Socioeconomic History   Marital status: Widowed    Spouse name: Not on file   Number of children: 2   Years of education: Not on file   Highest education level: Not on file  Occupational History   Occupation: retired    Fish farm manager: RETIRED  Tobacco Use   Smoking status: Former    Types: Pipe    Quit date: 01/18/1966    Years since quitting: 55.7   Smokeless tobacco: Never  Vaping Use   Vaping Use: Never used  Substance and Sexual Activity   Alcohol use: No    Alcohol/week: 0.0 standard drinks of alcohol   Drug use: No   Sexual activity: Not Currently   Other Topics Concern   Not on file  Social History Narrative   DNR   Widowed   2 daughters leave near by   Social Determinants of Health   Financial Resource Strain: Low Risk  (08/21/2021)   Overall Financial Resource Strain (CARDIA)    Difficulty of Paying Living Expenses: Not hard at all  Food Insecurity: No Food Insecurity (08/21/2021)   Hunger Vital Sign    Worried About Running Out of Food in the Last Year: Never true    [place] in the Last Year: Never true  Transportation Needs: No Transportation Needs (08/21/2021)   PRAPARE - Hydrologist (Medical): No    Lack of Transportation (Non-Medical): No  Physical Activity: Inactive (08/21/2021)   Exercise Vital Sign    Days of Exercise per Week: 0 days    Minutes of Exercise per Session: 0 min  Stress: Stress Concern Present (08/21/2021)   [place]    Feeling of Stress : To some extent  Social Connections: Not on file  Intimate Partner Violence: Not At Risk (09/23/2021)   Humiliation, Afraid, Rape, and Kick questionnaire    Fear of Current or Ex-Partner: No    Emotionally Abused: No    Physically Abused: No    Sexually Abused: No    Past Surgical History:  Procedure Laterality Date   BONE MARROW BIOPSY  2011   normal   CARDIAC CATHETERIZATION  2000   per dr Kathyrn Drown note --  dLAD 60-70%,  small intermediate branch 70-80%,  mRCA 20%,  normal LV   CARDIOVASCULAR STRESS TEST  01-29-2013   dr Stanford Breed   normal nuclear study w/ no ischemia,  normal LV function and wall motion , ef 82%   CATARACT EXTRACTION W/ INTRAOCULAR LENS  IMPLANT, BILATERAL  08/2015   CHOLECYSTECTOMY  11/24/2010   Procedure: LAPAROSCOPIC CHOLECYSTECTOMY WITH INTRAOPERATIVE CHOLANGIOGRAM;  Surgeon: Earnstine Regal, MD;  Location: [facility];  Service: General;  Laterality: N/A;  c-arm   CYSTOSCOPY W/ URETERAL STENT PLACEMENT Left 12/17/2015   Procedure: CYSTOSCOPY  WITH RETROGRADE PYELOGRAM/URETERAL STENT PLACEMENT;  Surgeon: Ardis Hughs, MD;  Location: [facility];  Service: Urology;  Laterality: Left;   CYSTOSCOPY WITH RETROGRADE PYELOGRAM, URETEROSCOPY AND STENT PLACEMENT Left 12/22/2015   Procedure: CYSTOSCOPY WITH LEFT RETROGRADE  URETEROSCOPY AND STENT PLACEMENT;  Surgeon: Carolan Clines, MD;  Location: [place];  Service: Urology;  Laterality: Left;   ERCP N/A 07/04/2013   Procedure: ENDOSCOPIC RETROGRADE CHOLANGIOPANCREATOGRAPHY (  ERCP);  Surgeon: Gatha Mayer, MD;  Location: [facility];  Service: Endoscopy;  Laterality: N/A;  MAC if available   ERCP N/A 10/12/2015   Procedure: ENDOSCOPIC RETROGRADE CHOLANGIOPANCREATOGRAPHY (ERCP);  Surgeon: [name], MD;  Location: [facility];  Service: Endoscopy;  Laterality: N/A;   HOLMIUM LASER APPLICATION Left 31/04/9700   Procedure: HOLMIUM LASER APPLICATION;  Surgeon: Carolan Clines, MD;  Location: [facility];  Service: Urology;  Laterality: Left;   INGUINAL HERNIA REPAIR Right 01/25/2002   KNEE ARTHROSCOPY  x5   SATURATION BIOPSY OF PROSTATE  05-29-2007  and 01-26-2008   SHOULDER ARTHROSCOPY WITH OPEN ROTATOR CUFF REPAIR AND DISTAL CLAVICLE ACROMINECTOMY Right 11/19/2004   TOTAL HIP ARTHROPLASTY Right 05/21/2009   TOTAL KNEE ARTHROPLASTY Bilateral left 05-02-2003/  right  03-23-2010   TRANSTHORACIC ECHOCARDIOGRAM  12/23/2004   ef 60%, mild MV calcification without stenosis/  mild TR,  PASP 1mHg   UVULOPALATOPHARYNGOPLASTY  1993    w/  T & A    Family History  Problem Relation Age of Onset   Peripheral vascular disease Mother    Colon cancer Father [phone]  Breast cancer Daughter    Breast cancer Daughter     Allergies  Allergen Reactions   Penicillins Hives and Other (See Comments)    [place], passed out Tolerates cephalosporins  Has patient had a PCN reaction causing immediate rash, facial/tongue/throat swelling, SOB or lightheadedness with  hypotension:  yes Has patient had a PCN reaction causing severe rash involving mucus membranes or skin necrosis: no Has patient had a PCN reaction that required hospitalization: no Has patient had a PCN reaction occurring within the last 10 years: no If all of the above answers are "NO", then may proceed with Cephalosporin use.    Tape Hives and Other (See Comments)    Paper Tape    Current Outpatient Medications on File Prior to Visit  Medication Sig Dispense Refill   Artificial Tear Solution (TEARS NATURALE OP) Place 1-2 drops into both eyes at bedtime as needed (dry eyes).     diclofenac Sodium (VOLTAREN ARTHRITIS PAIN) 1 % GEL Apply 2 g topically 3 (three) times daily as needed. (Patient taking differently: Apply 2 g topically 3 (three) times daily as needed (left shoulder and arm pain).) 100 g 3   Ensure (ENSURE) Take 237 mLs by mouth daily. Chocolate     famotidine (PEPCID) 20 MG tablet Take 1 tablet (20 mg total) by mouth daily. For itching. 90 tablet 0   finasteride (PROSCAR) 5 MG tablet Take 1 tablet (5 mg total) by mouth daily. For urine flow 90 tablet 0   fluticasone (FLONASE) 50 MCG/ACT nasal spray Place 1 spray into both nostrils 2 (two) times daily. 48 g 3   gabapentin (NEURONTIN) 100 MG capsule Take 2 capsules (200 mg total) by mouth 2 (two) times daily. 120 capsule 3   GEMTESA 75 MG TABS Take 1 tablet by mouth daily.     loratadine (CLARITIN) 10 MG tablet Take 1 tablet (10 mg total) by mouth daily. For allergies 90 tablet 3   OLANZapine (ZYPREXA) 5 MG tablet Take 1 tablet (5 mg total) by mouth at bedtime. 30 tablet 3   senna-docusate (SENOKOT-S) 8.6-50 MG tablet Take 1 tablet by mouth at bedtime. 30 tablet 3   sertraline (ZOLOFT) 100 MG tablet Take 1 tablet (100 mg total) by mouth daily. 30 tablet 3   tamsulosin (FLOMAX) 0.4 MG CAPS capsule TAKE 2 CAPSULES (0.8 MG TOTAL)  BY MOUTH DAILY. FOR URINE FLOW 180 capsule 0   triamcinolone cream (KENALOG) 0.5 % Apply 1 Application  topically 2 (two) times daily. 30 g 0   No current facility-administered medications on file prior to visit.    BP 118/68   Pulse 81   Temp 97.7 F (36.5 C) (Temporal)   Ht 5' (1.524 m)   Wt 133 lb (60.3 kg)   SpO2 96%   BMI 25.97 kg/m  Objective:   Physical Exam Constitutional:      General: He is not in acute distress. Cardiovascular:     Rate and Rhythm: Normal rate and regular rhythm.  Pulmonary:     Effort: Pulmonary effort is normal.  Skin:    General: Skin is warm and dry.     Findings: Rash present.     Comments: Petechial appearing rash to anterior and posterior trunk, bilateral lower extremities, bilateral upper extremities.  Worse to lumbar back and left medial thigh.  1.5 cm rounded, raised, firm mass noted to left medial thigh at site of tick bite.  Nontender.  No increased warmth.  Areas surrounding prior tick bite with purpleish skin discoloration.           Assessment & Plan:   Problem List Items Addressed This Visit       Musculoskeletal and Integument   Skin rash - Primary    Unclear etiology.  Consider Zyprexa versus finasteride to be causing symptoms.  Would start with removing Zyprexa first. Checking labs today including CBC, Lyme disease, [place] spotted fever labs.  Start prednisone 40 mg daily x4 days, then 20 mg daily x4 days.  Await results.      Relevant Medications   predniSONE (DELTASONE) 20 MG tablet   Other Relevant Orders   B. burgdorfi antibodies by WB   Rocky mtn spotted fvr abs pnl(IgG+IgM)     Hematopoietic and Hemostatic   Thrombocytopenia (HCC) (Chronic)    Do suspect petechiae to be secondary to thrombocytopenia, repeat CBC pending today. I never heard from patient's hematology provider, therefore will contact if CBC shows decline in platelet counts.      Relevant Orders   CBC       Pleas Koch, NP

## 2021-10-22 NOTE — Assessment & Plan Note (Signed)
Do suspect petechiae to be secondary to thrombocytopenia, repeat CBC pending today. I never heard from patient's hematology provider, therefore will contact if CBC shows decline in platelet counts.

## 2021-10-22 NOTE — Telephone Encounter (Signed)
Home Health verbal orders Caller Name: stephanie  Agency Name: [facility]  Callback number: [phone], secured   Requesting OT/PT/Skilled nursing/Social Work/Speech: OT   Reason:  Frequency: one x six   Please forward to [facility] pool or providers CMA  

## 2021-10-22 NOTE — Patient Instructions (Signed)
Stop by the lab prior to leaving today. I will notify you of your results once received.   Start prednisone tablets. Take two tablets my mouth once daily in the morning for four days, then one tablet once daily in the morning for four days.   It was a pleasure to see you today!

## 2021-10-22 NOTE — Assessment & Plan Note (Signed)
Unclear etiology.  Consider Zyprexa versus finasteride to be causing symptoms.  Would start with removing Zyprexa first. Checking labs today including CBC, Lyme disease, [place] spotted fever labs.  Start prednisone 40 mg daily x4 days, then 20 mg daily x4 days.  Await results.

## 2021-10-22 NOTE — Telephone Encounter (Signed)
Noted. See result note. Adding diff

## 2021-10-22 NOTE — Telephone Encounter (Signed)
Critical results called by Marisue Brooklyn at [facility] lab. Critical high white blood count  21.9

## 2021-10-23 LAB — PATHOLOGIST SMEAR REVIEW

## 2021-10-23 NOTE — Telephone Encounter (Signed)
Approved.  

## 2021-10-23 NOTE — Telephone Encounter (Signed)
Patient daughter [name] called in to return a call she received regarding [facility] lab results. She be reached at [phone] and can be reached at any time of the day. Thank you!

## 2021-10-26 LAB — B. BURGDORFI ANTIBODIES BY WB
B burgdorferi IgG Abs (IB): NEGATIVE
B burgdorferi IgM Abs (IB): NEGATIVE
Lyme Disease 18 kD IgG: NONREACTIVE
Lyme Disease 23 kD IgG: NONREACTIVE
Lyme Disease 23 kD IgM: NONREACTIVE
Lyme Disease 28 kD IgG: NONREACTIVE
Lyme Disease 30 kD IgG: NONREACTIVE
Lyme Disease 39 kD IgG: REACTIVE — AB
Lyme Disease 39 kD IgM: NONREACTIVE
Lyme Disease 41 kD IgG: REACTIVE — AB
Lyme Disease 41 kD IgM: NONREACTIVE
Lyme Disease 45 kD IgG: NONREACTIVE
Lyme Disease 58 kD IgG: NONREACTIVE
Lyme Disease 66 kD IgG: NONREACTIVE
Lyme Disease 93 kD IgG: NONREACTIVE

## 2021-10-26 LAB — ROCKY MTN SPOTTED FVR ABS PNL(IGG+IGM)
RMSF IgG: NOT DETECTED
RMSF IgM: NOT DETECTED

## 2021-10-26 NOTE — Telephone Encounter (Signed)
Spoke with Colletta Maryland from [place] and advised that orders have been approved.

## 2021-10-26 NOTE — Telephone Encounter (Signed)
Patient's daughter was contacted on 10/24/21.

## 2021-11-04 ENCOUNTER — Other Ambulatory Visit: Payer: Self-pay | Admitting: *Deleted

## 2021-11-04 ENCOUNTER — Inpatient Hospital Stay: Payer: Medicare HMO | Attending: Hematology and Oncology

## 2021-11-04 ENCOUNTER — Other Ambulatory Visit: Payer: Self-pay

## 2021-11-04 DIAGNOSIS — D72821 Monocytosis (symptomatic): Secondary | ICD-10-CM | POA: Diagnosis not present

## 2021-11-04 DIAGNOSIS — D75839 Thrombocytosis, unspecified: Secondary | ICD-10-CM | POA: Insufficient documentation

## 2021-11-04 DIAGNOSIS — D696 Thrombocytopenia, unspecified: Secondary | ICD-10-CM

## 2021-11-04 LAB — CBC WITH DIFFERENTIAL (CANCER CENTER ONLY)
Abs Immature Granulocytes: 4.43 10*3/uL — ABNORMAL HIGH (ref 0.00–0.07)
Basophils Absolute: 0.3 10*3/uL — ABNORMAL HIGH (ref 0.0–0.1)
Basophils Relative: 1 %
Eosinophils Absolute: 0.4 10*3/uL (ref 0.0–0.5)
Eosinophils Relative: 1 %
HCT: 42.4 % (ref 39.0–52.0)
Hemoglobin: 13.2 g/dL (ref 13.0–17.0)
Immature Granulocytes: 13 %
Lymphocytes Relative: 6 %
Lymphs Abs: 2.2 10*3/uL (ref 0.7–4.0)
MCH: 25.1 pg — ABNORMAL LOW (ref 26.0–34.0)
MCHC: 31.1 g/dL (ref 30.0–36.0)
MCV: 80.6 fL (ref 80.0–100.0)
Monocytes Absolute: 7.4 10*3/uL — ABNORMAL HIGH (ref 0.1–1.0)
Monocytes Relative: 21 %
Neutro Abs: 20.7 10*3/uL — ABNORMAL HIGH (ref 1.7–7.7)
Neutrophils Relative %: 58 %
Platelet Count: 64 10*3/uL — ABNORMAL LOW (ref 150–400)
RBC: 5.26 MIL/uL (ref 4.22–5.81)
RDW: 16.4 % — ABNORMAL HIGH (ref 11.5–15.5)
WBC Count: 35.5 10*3/uL — ABNORMAL HIGH (ref 4.0–10.5)
nRBC: 0 % (ref 0.0–0.2)

## 2021-11-04 LAB — CMP (CANCER CENTER ONLY)
ALT: 11 U/L (ref 0–44)
AST: 16 U/L (ref 15–41)
Albumin: 3.8 g/dL (ref 3.5–5.0)
Alkaline Phosphatase: 75 U/L (ref 38–126)
Anion gap: 2 — ABNORMAL LOW (ref 5–15)
BUN: 14 mg/dL (ref 8–23)
CO2: 30 mmol/L (ref 22–32)
Calcium: 8.5 mg/dL — ABNORMAL LOW (ref 8.9–10.3)
Chloride: 109 mmol/L (ref 98–111)
Creatinine: 1.13 mg/dL (ref 0.61–1.24)
GFR, Estimated: >60 mL/min (ref >60)
Glucose, Bld: 108 mg/dL — ABNORMAL HIGH (ref 70–99)
Potassium: 4.3 mmol/L (ref 3.5–5.1)
Sodium: 141 mmol/L (ref 135–145)
Total Bilirubin: 0.5 mg/dL (ref 0.3–1.2)
Total Protein: 5.4 g/dL — ABNORMAL LOW (ref 6.5–8.1)

## 2021-11-05 ENCOUNTER — Telehealth: Payer: Self-pay | Admitting: Primary Care

## 2021-11-05 DIAGNOSIS — R21 Rash and other nonspecific skin eruption: Secondary | ICD-10-CM

## 2021-11-05 DIAGNOSIS — L299 Pruritus, unspecified: Secondary | ICD-10-CM

## 2021-11-05 NOTE — Telephone Encounter (Signed)
Patient daughter called in stating that the rash on patients back is not getting any better.Today he went outside and scratched his back on bricks because it was itching so bad. She would like to know what to do next,because none of the cream is working for him. She did state that at his appointment at the cancer center yesterday,his white blood count was high.,she just wanted kate to know.

## 2021-11-05 NOTE — Telephone Encounter (Signed)
Placing an urgent referral to dermatology as he's failed prednisone, hydroxyzine, Claritin, famotidine, and also eliminating newer medications.   Ashtyn, FYI.

## 2021-11-05 NOTE — Telephone Encounter (Signed)
Noted. Referral is in my WQ to be worked.

## 2021-11-11 DIAGNOSIS — R21 Rash and other nonspecific skin eruption: Secondary | ICD-10-CM | POA: Diagnosis not present

## 2021-11-11 DIAGNOSIS — D485 Neoplasm of uncertain behavior of skin: Secondary | ICD-10-CM | POA: Diagnosis not present

## 2021-11-11 DIAGNOSIS — L986 Other infiltrative disorders of the skin and subcutaneous tissue: Secondary | ICD-10-CM | POA: Diagnosis not present

## 2021-11-12 ENCOUNTER — Other Ambulatory Visit: Payer: Self-pay | Admitting: Primary Care

## 2021-11-12 ENCOUNTER — Telehealth: Payer: Self-pay | Admitting: *Deleted

## 2021-11-12 DIAGNOSIS — N401 Enlarged prostate with lower urinary tract symptoms: Secondary | ICD-10-CM

## 2021-11-12 NOTE — Telephone Encounter (Signed)
Attempted to call patient and patients daughter to verify this prescription needs to be switched over to [facility]. Unable to reach and unable to leave voicemail. Will try to call again later.

## 2021-11-12 NOTE — Telephone Encounter (Signed)
Pt daughter Gatha Mayer  returning call . Would like a call back # [phone]

## 2021-11-12 NOTE — Telephone Encounter (Signed)
Attempted to call patients daughter back. No answer and unable to leave voicemail.   If patients daughter calls back please verify they are requesting tamsulosin to be switched over to [facility]. Thanks

## 2021-11-12 NOTE — Telephone Encounter (Signed)
-----   Message from [place], MD sent at 11/08/2021  7:13 PM EDT ----- Please let Mr. Mercer know that his hemoglobin is stable in the normal range and his platelets are at their baseline of 60.  No intervention needed at this time.  We will see him back in January as scheduled if he has any unexpected changes in his health would recommend he call us for earlier visit. ----- Message ----- From: [place]: 11/04/2021   1:13 PM EDT To: Orson Slick, MD

## 2021-11-12 NOTE — Telephone Encounter (Signed)
TCT patient/daughter, [name] regarding labs  from 11/04/21.  No answer and her vm was full unable to leave any messages.

## 2021-11-12 NOTE — Telephone Encounter (Signed)
Spoke with [name], patients daughter. She verified all prescriptions for patient need to be sent to [facility] from now on. Updated in preferred pharmacies.  Patient never picked up tamsulosin from [facility], and is in need of a refill, would like it switched to [facility].

## 2021-11-13 NOTE — Telephone Encounter (Signed)
Unable to reach patients daughter. Unable to leave voicemail. 

## 2021-11-13 NOTE — Telephone Encounter (Signed)
Patients daughter sharon returned phone call. Cb [phone]

## 2021-11-13 NOTE — Telephone Encounter (Signed)
Patients daughter states he is taking 0.8 mg tamsulosin daily and finasteride 5 mg daily. She doesn't think urology changed anything.

## 2021-11-13 NOTE — Telephone Encounter (Signed)
Noted. Please notify daughter that we will move the Rx to [facility]. I have that he is taking 0.8 mg of tamsulosin (2 capsules daily). Is that what he has been taking? Or was this changed by Urology? I still don't have their office notes. I know that he is taking finasteride 5 mg daily for urine flow.

## 2021-11-13 NOTE — Telephone Encounter (Signed)
Noted, Rx sent to pharmacy. 

## 2021-11-19 ENCOUNTER — Ambulatory Visit: Payer: Medicare HMO | Admitting: Podiatry

## 2021-11-19 ENCOUNTER — Encounter: Payer: Self-pay | Admitting: Podiatry

## 2021-11-19 DIAGNOSIS — M79675 Pain in left toe(s): Secondary | ICD-10-CM | POA: Diagnosis not present

## 2021-11-19 DIAGNOSIS — M79674 Pain in right toe(s): Secondary | ICD-10-CM | POA: Diagnosis not present

## 2021-11-19 DIAGNOSIS — B351 Tinea unguium: Secondary | ICD-10-CM | POA: Diagnosis not present

## 2021-11-19 DIAGNOSIS — D696 Thrombocytopenia, unspecified: Secondary | ICD-10-CM | POA: Diagnosis not present

## 2021-11-19 NOTE — Progress Notes (Signed)

## 2021-11-25 ENCOUNTER — Telehealth: Payer: Self-pay | Admitting: Primary Care

## 2021-11-25 NOTE — Telephone Encounter (Signed)
Unable to reach [place]. Left voicemail to return call to our office.

## 2021-11-25 NOTE — Telephone Encounter (Signed)
Colletta Maryland from [place] called and stated that patient was doing well last week but now it has been a decline this week. She wasn't with the patient at the moment didn't know if they want to do labs on patient. Call back number [phone].

## 2021-11-25 NOTE — Telephone Encounter (Signed)
Pt's daughter, [name], is calling for advice about switching from occupational therapy to supportive care due to leukemia. Call back # [phone]

## 2021-11-25 NOTE — Telephone Encounter (Signed)
Called and spoke to patients daughter, [name]. She states patient had 2 shaved biopsy's done at the dermatologist. The dermatologist spoke with the pathologist who said that his leukemia is in his skin cells, dermatologist said they have never seen anything like this before. This explains what is going on with his skin and on his back. [name] states he is very fatigued and still itching so bad, the dermatologist gave him some more topicals and this has seemed to ease things up just a tad. She states she feels they need assistance with some kind of home health program. [name] is having surgery at the end of November and is not going to be able to be his primary caregiver like she is now. She states with the occupational therapy he has been receiving, he participates in the exercises while the OT is there, but throughout the week he does not do the exercises. She thinks he would benefit from receiving extra help instead of occupational therapy, someone that can offer supportive care.  Notified [name] you are out of town currently and will be back 11/14. [name] would appreciate a phone call when you get back in town to discuss options for his care.

## 2021-11-25 NOTE — Telephone Encounter (Signed)
Spoke with patients daughter [name]. See other phone note for further documentation.

## 2021-11-27 ENCOUNTER — Telehealth: Payer: Self-pay | Admitting: Primary Care

## 2021-11-27 NOTE — Telephone Encounter (Signed)
Tillie Rung from [place] called in and stated that patient has been experiencing burning with urination for the past 2 weeks. She was wanting to get an order for an Urine analysis with culture and sensitivity. Thank you!

## 2021-11-27 NOTE — Telephone Encounter (Signed)
Approved.  

## 2021-11-27 NOTE — Telephone Encounter (Signed)
Home Health verbal orders Caller Name: [place] [facility]: [facility]  Callback number: [phone]  Requesting OT/PT/Skilled nursing/Social Work/Speech: ot, recertification visit   Reason:  Frequency:one x one   Please forward to [facility] pool or providers CMA

## 2021-11-30 NOTE — Telephone Encounter (Signed)
Noted. Await urine and culture results.

## 2021-11-30 NOTE — Telephone Encounter (Signed)
Spoke with Tillie Rung from [facility] and advised Anda Kraft approved UA and culture to be done for patient. Tillie Rung states they will go ahead and do this.   She also wanted to let Anda Kraft know that patient states when he urinated he "foams up the toilet", he did say that he urinates with an extremely fast and forceful stream. This has been going on for months and Tillie Rung states she believes the foam is due to the fast urination, but she wanted to let patients PCP know.

## 2021-11-30 NOTE — Telephone Encounter (Signed)
[name] with [facility] of approval of verbal orders

## 2021-12-01 ENCOUNTER — Telehealth: Payer: Self-pay | Admitting: Primary Care

## 2021-12-01 NOTE — Telephone Encounter (Signed)
Home Health verbal orders Caller Name: stephanie  Agency Name: [facility]  Callback number: [phone], secured   Requesting OT/PT/Skilled nursing/Social Work/Speech: OT   Reason:  Frequency: one x six   Please forward to [facility] pool or providers CMA  

## 2021-12-02 DIAGNOSIS — F333 Major depressive disorder, recurrent, severe with psychotic symptoms: Secondary | ICD-10-CM | POA: Diagnosis not present

## 2021-12-02 NOTE — Telephone Encounter (Signed)
Approved.  Can we also ask the home health agency to provide some information to patient's family regarding a home health aid to assist with ADL's? They are having a tough time as he is getting to be mostly total care.

## 2021-12-02 NOTE — Telephone Encounter (Signed)
Called patient's daughter, [name]. No answer. Left voicemail for return call.

## 2021-12-02 NOTE — Telephone Encounter (Signed)
Spoke with [name] gave her the approval of verbal orders. Also, asked her to give information to the family regarding a home health aid to assist the patient

## 2021-12-03 ENCOUNTER — Encounter: Payer: Self-pay | Admitting: Primary Care

## 2021-12-08 NOTE — Telephone Encounter (Signed)
Pt daughter [name] called in requesting to know pt lab results . Would like a call back # [phone]

## 2021-12-09 NOTE — Telephone Encounter (Signed)
I have not seen anything else come through fax for this patient. I will be on the lookout.   Called and spoke with patients daughter, advised we received the urinalysis and are waiting on the urine culture results to return.

## 2021-12-09 NOTE — Telephone Encounter (Signed)
Please notify patient's daughter that his urinalysis returned but I am waiting for the urine culture for which I have not seen yet.  Please let me know if you have seen it, [facility]

## 2021-12-15 ENCOUNTER — Telehealth: Payer: Self-pay | Admitting: Primary Care

## 2021-12-15 DIAGNOSIS — R300 Dysuria: Secondary | ICD-10-CM

## 2021-12-15 MED ORDER — SULFAMETHOXAZOLE-TRIMETHOPRIM 800-160 MG PO TABS: 1.0000 | ORAL_TABLET | Freq: Two times a day (BID) | ORAL | 0 refills | Status: DC

## 2021-12-15 NOTE — Telephone Encounter (Signed)
Notify patient's daughter that we will send Bactrim DS tablets to his pharmacy for antibiotic treatment. If his symptoms do NOT improve then he needs an office visit ASAP.  Typically we like to wait for a urine culture, but under these circumstances we will proceed with treatment. Please also tell her that the next time he has symptoms such as these it's better to bring him to our office where the urine culture turn around time is 2 days.

## 2021-12-15 NOTE — Telephone Encounter (Signed)
Noted and agree. 

## 2021-12-15 NOTE — Telephone Encounter (Signed)
Still have not received fax in my s-drive. I checked with the front staff this afternoon to see the status of when the fax will be put in my s-drive and they said hopefully by tomorrow.   Called and spoke to patients daughter. I notified her of what happened with [facility], and that they have faxed the results and now we are waiting for it to be put in my s-drive so I can give to [facility].  She is understandably frustrated that the patient has had to wait 2 weeks to get treatment. She wants to know if something can be done now with the results that we do have from the urinalysis or if they have to continue to wait for the paperwork to be processed. She has viewed the urinalysis since it has been put in media and is concerned that there are abnormalities and he is not being treated. She requested a call back by end of day with Kates response.

## 2021-12-15 NOTE — Telephone Encounter (Signed)
Called and spoke to [facility] regarding the urine culture result we have not received yet. There was a mistake on their end and they had the wrong birth date listed for the patient. The technician I spoke with faxed the results for the urinalysis and urine culture to [phone]. Waiting for the fax to be put into my s-drive.

## 2021-12-15 NOTE — Telephone Encounter (Signed)
Patients daughter has been notified. She will pick up the prescription from the pharmacy. She stated she was unsure if the patient is having burning with urination now, but she will verify with him when she sees him tonight. She asked if this is concerning for his kidneys and if they should go back to see urology. I advised it would be a good idea to see urology again, especially if he is experiencing symptoms. She mentioned he has been having symptoms of dizziness, fatigue and feet swelling. I advised she should make an office visit appointment so we can address these things. She will discuss with her sister and the patient and call back to schedule if that's what they decide to do.

## 2021-12-15 NOTE — Telephone Encounter (Signed)
Pt's daughter, [name], called asking for a call back about pt's lab results regarding urinalysis? Call back # [phone]

## 2021-12-16 ENCOUNTER — Telehealth: Payer: Self-pay | Admitting: Primary Care

## 2021-12-16 NOTE — Telephone Encounter (Signed)
Noted. If he's improving on the antibiotic then okay to take, but agree with close cancer doctor and urology follow up

## 2021-12-16 NOTE — Telephone Encounter (Signed)
Spoke with patients daughter, she will get patient to stop taking antibiotic. She is concerned about the abnormalities found on the UA. When asked if the patient was still experiencing symptoms, she said her sister took over caring for him yesterday and she doesn't know if he is currently having symptoms. She stated he has an appointment next week with the cancer doctor and will share her concern with him and she also would like urology evaluation. She states they will follow up with urologist he has seen before.

## 2021-12-16 NOTE — Telephone Encounter (Signed)
We finally received the patient's urine culture results which are negative for infection.  Please notify patient's daughter that he should not be taking the antibiotic.

## 2021-12-17 ENCOUNTER — Ambulatory Visit: Payer: Medicare HMO | Admitting: Family Medicine

## 2021-12-21 ENCOUNTER — Telehealth: Payer: Self-pay | Admitting: Primary Care

## 2021-12-21 DIAGNOSIS — R296 Repeated falls: Secondary | ICD-10-CM

## 2021-12-21 DIAGNOSIS — D696 Thrombocytopenia, unspecified: Secondary | ICD-10-CM

## 2021-12-21 NOTE — Telephone Encounter (Signed)
Patient daughter [name] called in and wanted to speak with someone about initiating palliative care for [name]. She also would like a copy of lab work that he had done recently. Thank you!

## 2021-12-21 NOTE — Telephone Encounter (Signed)
Spoke with patients daughter, [name]. Placed copy of lab results at front reception for pick up.   She would like to request an evaluation for palliative care to be considered. The patients insurance would not cover the home health aid any longer, so they have been without help for a few weeks now.

## 2021-12-22 NOTE — Addendum Note (Signed)
Addended by: Pat Kocher on: 12/22/2021 01:28 PM   Modules accepted: Orders

## 2021-12-22 NOTE — Telephone Encounter (Signed)
Please notify patient's daughter that a referral to palliative care was placed.  They should be hearing from them within the next week.

## 2021-12-22 NOTE — Addendum Note (Signed)
Addended by: Pleas Koch on: 12/22/2021 01:12 PM   Modules accepted: Orders

## 2021-12-22 NOTE — Telephone Encounter (Signed)
Patients daughter [name] was notified that referral for palliative care has been placed

## 2021-12-23 DIAGNOSIS — R300 Dysuria: Secondary | ICD-10-CM | POA: Diagnosis not present

## 2021-12-23 DIAGNOSIS — Z0001 Encounter for general adult medical examination with abnormal findings: Secondary | ICD-10-CM | POA: Diagnosis not present

## 2021-12-25 NOTE — Telephone Encounter (Signed)
Spoke with Hoyle Sauer, we are going to have to write it out on a prescription pad and fax it to her.   Anda Kraft, please do a written order on prescription pad that says  "Palliative Care - [facility] skilled nursing"  Fax #: [phone]

## 2021-12-25 NOTE — Telephone Encounter (Signed)
[name] from [facility] called in and stated they can't pull the order. She stated the order would need to be put in a certain way for her pull it. She can be reached at [phone]. Thank you!

## 2021-12-25 NOTE — Telephone Encounter (Signed)
Faxed to the number provided

## 2021-12-25 NOTE — Telephone Encounter (Signed)
Completed and handed to [facility]

## 2021-12-25 NOTE — Telephone Encounter (Signed)
[name] from [facility] states they cannot accept just a referral for palliative care. Her management team is telling her it must be entered through [facility] under the orders tab. It must say Palliative HH skilled nursing. This is the only way that the palliative care order will be approved.

## 2022-01-03 ENCOUNTER — Emergency Department (HOSPITAL_COMMUNITY)
Admission: EM | Admit: 2022-01-03 | Discharge: 2022-01-04 | Disposition: A | Discharge status: Home / Self Care | Payer: Medicare Other | Attending: Emergency Medicine | Admitting: Emergency Medicine

## 2022-01-03 ENCOUNTER — Encounter (HOSPITAL_COMMUNITY): Payer: Self-pay

## 2022-01-03 ENCOUNTER — Emergency Department (HOSPITAL_COMMUNITY): Payer: Medicare Other

## 2022-01-03 ENCOUNTER — Other Ambulatory Visit: Payer: Self-pay

## 2022-01-03 DIAGNOSIS — W01198A Fall on same level from slipping, tripping and stumbling with subsequent striking against other object, initial encounter: Secondary | ICD-10-CM | POA: Diagnosis not present

## 2022-01-03 DIAGNOSIS — D72829 Elevated white blood cell count, unspecified: Secondary | ICD-10-CM | POA: Insufficient documentation

## 2022-01-03 DIAGNOSIS — S0990XA Unspecified injury of head, initial encounter: Secondary | ICD-10-CM | POA: Diagnosis not present

## 2022-01-03 DIAGNOSIS — W19XXXA Unspecified fall, initial encounter: Secondary | ICD-10-CM | POA: Diagnosis not present

## 2022-01-03 DIAGNOSIS — S0181XA Laceration without foreign body of other part of head, initial encounter: Secondary | ICD-10-CM | POA: Diagnosis not present

## 2022-01-03 DIAGNOSIS — S0993XA Unspecified injury of face, initial encounter: Secondary | ICD-10-CM | POA: Diagnosis not present

## 2022-01-03 DIAGNOSIS — S0003XA Contusion of scalp, initial encounter: Secondary | ICD-10-CM | POA: Diagnosis not present

## 2022-01-03 LAB — CBC WITH DIFFERENTIAL/PLATELET
Abs Immature Granulocytes: 0.64 10*3/uL — ABNORMAL HIGH (ref 0.00–0.07)
Basophils Absolute: 0.1 10*3/uL (ref 0.0–0.1)
Basophils Relative: 1 %
Eosinophils Absolute: 0.3 10*3/uL (ref 0.0–0.5)
Eosinophils Relative: 2 %
HCT: 41 % (ref 39.0–52.0)
Hemoglobin: 12 g/dL — ABNORMAL LOW (ref 13.0–17.0)
Immature Granulocytes: 6 %
Lymphocytes Relative: 13 %
Lymphs Abs: 1.5 10*3/uL (ref 0.7–4.0)
MCH: 22.1 pg — ABNORMAL LOW (ref 26.0–34.0)
MCHC: 29.3 g/dL — ABNORMAL LOW (ref 30.0–36.0)
MCV: 75.5 fL — ABNORMAL LOW (ref 80.0–100.0)
Monocytes Absolute: 2.8 10*3/uL — ABNORMAL HIGH (ref 0.1–1.0)
Monocytes Relative: 24 %
Neutro Abs: 6.2 10*3/uL (ref 1.7–7.7)
Neutrophils Relative %: 54 %
Platelets: 38 10*3/uL — ABNORMAL LOW (ref 150–400)
RBC: 5.43 MIL/uL (ref 4.22–5.81)
RDW: 18 % — ABNORMAL HIGH (ref 11.5–15.5)
WBC: 11.6 10*3/uL — ABNORMAL HIGH (ref 4.0–10.5)
nRBC: 0 % (ref 0.0–0.2)

## 2022-01-03 LAB — COMPREHENSIVE METABOLIC PANEL
ALT: 13 U/L (ref 0–44)
AST: 20 U/L (ref 15–41)
Albumin: 3.4 g/dL — ABNORMAL LOW (ref 3.5–5.0)
Alkaline Phosphatase: 95 U/L (ref 38–126)
Anion gap: 6 (ref 5–15)
BUN: 20 mg/dL (ref 8–23)
CO2: 25 mmol/L (ref 22–32)
Calcium: 8.6 mg/dL — ABNORMAL LOW (ref 8.9–10.3)
Chloride: 109 mmol/L (ref 98–111)
Creatinine, Ser: 1.09 mg/dL (ref 0.61–1.24)
GFR, Estimated: >60 mL/min (ref >60)
Glucose, Bld: 109 mg/dL — ABNORMAL HIGH (ref 70–99)
Potassium: 4.3 mmol/L (ref 3.5–5.1)
Sodium: 140 mmol/L (ref 135–145)
Total Bilirubin: 0.4 mg/dL (ref 0.3–1.2)
Total Protein: 5.6 g/dL — ABNORMAL LOW (ref 6.5–8.1)

## 2022-01-03 LAB — URINALYSIS, ROUTINE W REFLEX MICROSCOPIC
Bilirubin Urine: NEGATIVE
Glucose, UA: NEGATIVE mg/dL
Hgb urine dipstick: NEGATIVE
Ketones, ur: NEGATIVE mg/dL
Leukocytes,Ua: NEGATIVE
Nitrite: NEGATIVE
Protein, ur: NEGATIVE mg/dL
Specific Gravity, Urine: 1.02 (ref 1.005–1.030)
pH: 5 (ref 5.0–8.0)

## 2022-01-03 MED ORDER — TETANUS-DIPHTH-ACELL PERTUSSIS 5-2.5-18.5 LF-MCG/0.5 IM SUSY: 0.5000 mL | PREFILLED_SYRINGE | Freq: Once | INTRAMUSCULAR | Status: DC

## 2022-01-03 NOTE — ED Triage Notes (Signed)
Per EMS- Patient has dementia, but lives by himself.  Patient had a mechanical fall on his porch today.  Patient has a 2 inch laceration above the right eye and a skin tear to the right wrist. Patient's family reports a history of atrial fib.

## 2022-01-03 NOTE — ED Provider Triage Note (Signed)
Emergency Medicine Provider Triage Evaluation Note  [name] , a 88 y.o. male  was evaluated in triage.  Pt complains of fall occurred earlier this morning when he tripped while going out to check on his car.  He states he denies any chest pain, shortness of breath, or dizziness prior to the fall.  Patient is head, but did not lose consciousness.  Denies any blood thinners.  Last tetanus shot was earlier this year in 2023.  He denies any neck pain, chest wall pain, abdominal pain, nausea, vomiting, or dizziness..  Review of Systems  Positive: Head wound Negative: Chest pain, dizziness  Physical Exam  BP (!) 122/44 (BP Location: Left Arm)   Pulse (!) 59   Temp 98 F (36.7 C) (Oral)   Resp 16   Ht 5' (1.524 m)   Wt 61.7 kg   SpO2 97%   BMI 26.56 kg/m  Gen:   Awake, no distress   Resp:  Normal effort  MSK:   Moves extremities without difficulty  Other:  No Cspine ttp, +abrasion to R temple  Medical Decision Making  Medically screening exam initiated at 9:54 AM.  Appropriate orders placed.  [name] was informed that the remainder of the evaluation will be completed by another provider, this initial triage assessment does not replace that evaluation, and the importance of remaining in the ED until their evaluation is complete.     Osvaldo Shipper, [place] 01/03/22 [phone]

## 2022-01-03 NOTE — Discharge Instructions (Addendum)
You have been seen today for your complaint of fall with a laceration to your head. Your lab work was at your baseline, showed no signs of UTI. Your imaging showed no intracranial abnormalities. Home care instructions are as follows:  Take care not to fall at home.  Follow the instructions listed in this packet for fall prevention. Follow up with: Primary care, primary care and neurology.  You should receive a call in the next 72 nights from neurology to schedule an appointment for assessment. Please seek immediate medical care if you develop any of the following symptoms: Change in mental status, recurrent falls, headaches, vision changes, weakness At this time there does not appear to be the presence of an emergent medical condition, however there is always the potential for conditions to change. Please read and follow the below instructions.  Do not take your medicine if  develop an itchy rash, swelling in your mouth or lips, or difficulty breathing; call 911 and seek immediate emergency medical attention if this occurs.  You may review your lab tests and imaging results in their entirety on your MyChart account.  Please discuss all results of fully with your primary care provider and other specialist at your follow-up visit.  Note: Portions of this text may have been transcribed using voice recognition software. Every effort was made to ensure accuracy; however, inadvertent computerized transcription errors may still be present.

## 2022-01-03 NOTE — ED Provider Notes (Signed)
I provided a substantive portion of the care of this patient.  I personally performed the entirety of the history for this encounter.     Has had 3 falls in the past week.  Today he struck his forehead.  Patient is not on blood thinners.  Patient denies any complaints.  Patient is alert and interactive.  He has any laceration and small hematoma to the right forehead.  This has been repaired.  No active bleeding.  Extraocular motions intact.  Patient follows commands appropriately.  Symmetric grip strength.  Patient has 2 daughters involved in his care.  Patient daughter who is power of attorney wishes him to return back home for care.  Patient's daughter present at this time feels patient would benefit from rehab or placement.  PA-C Schutt has had extensive conversations with all involved parties.  At this point the patient wishes to return home and his daughter is POA wishes to return home.  Current plan will be for discharge and continued home care and discussion regarding long-term in-home care versus placement or rehab.   Charlesetta Shanks, MD 01/03/22 1426

## 2022-01-03 NOTE — ED Provider Notes (Signed)
[facility] DEPT Provider Note   CSN: [id number] Arrival date & time: 01/03/22  3710     History  Chief Complaint  Patient presents with   Fall   Head Injury    [name] is a 88 y.o. male.  With numerous medical conditions including but not limited to hypertension, hyperlipidemia, thrombocytopenia, pernicious anemia, vitamin B deficiency, renal insufficiency, depression with suicide attempt in August of this year, family diagnosed dementia who presents to the ED via EMS for evaluation of a fall.  Patient states that he woke up, turned around immediately afterwards and tripped on the rubber mat.  He fell forward onto his hands and knees.  He then hit his head on the ground.  He did not lose consciousness.  He is not on any blood thinners.  Currently not complaining of vision changes, headache or neck pain.  He did not feel dizzy or lightheaded prior to falling.  He states he had his foot caught on the right lower back which caused the fall.  Denies pain anywhere else.  Denies numbness or tingling.  Daughter [name] is present and provides a large portion of the history.  She states that this is the third time he has fallen in the past week.  He is also been displaying odd behaviors such as turning off the electricity to his house because he thought he heard the water pump running, laying in positions and unable to get himself up, urinating on himself and defecating on himself when he cannot get himself up.  He finished a course of Keflex 4 days ago for possible UTI which is prescribed by urology.  She states that the patient's other daughter, her twin sister [name] is the power of attorney and does not want the patient to be admitted to a nursing home.  Daughter also says that patient has been diagnosed with leukemia but has not been for concerns of repeat suicide attempt. Daughter also states that they have started the process of establishing the patient with  palliative care.   Fall  Head Injury      Home Medications Prior to Admission medications   Medication Sig Start Date End Date Taking? Authorizing Provider  ARIPiprazole (ABILIFY) 2 MG tablet Take 2 mg by mouth daily. 12/02/21   [provider]  Artificial Tear Solution (TEARS NATURALE OP) Place 1-2 drops into both eyes at bedtime as needed (dry eyes).    [provider]  diclofenac Sodium (VOLTAREN ARTHRITIS PAIN) 1 % GEL Apply 2 g topically 3 (three) times daily as needed. Patient taking differently: Apply 2 g topically 3 (three) times daily as needed (left shoulder and arm pain). 08/18/21   Pleas Koch, NP  Ensure (ENSURE) Take 237 mLs by mouth daily. Chocolate    [provider]  famotidine (PEPCID) 20 MG tablet Take 1 tablet (20 mg total) by mouth daily. For itching. 10/14/21   Pleas Koch, NP  finasteride (PROSCAR) 5 MG tablet Take 1 tablet (5 mg total) by mouth daily. For urine flow Patient not taking: Reported on 11/19/2021 09/28/21   Pleas Koch, NP  fluticasone [facility]) 50 MCG/ACT nasal spray Place 1 spray into both nostrils 2 (two) times daily. 08/18/21   Pleas Koch, NP  gabapentin (NEURONTIN) 100 MG capsule Take 2 capsules (200 mg total) by mouth 2 (two) times daily. 09/23/21   Parks Ranger, DO  GEMTESA 75 MG TABS Take 1 tablet by mouth daily. 07/31/21  [provider]  loratadine (CLARITIN) 10 MG tablet Take 1 tablet (10 mg total) by mouth daily. For allergies Patient not taking: Reported on 11/19/2021 08/18/21   Pleas Koch, NP  OLANZapine (ZYPREXA) 5 MG tablet Take 1 tablet (5 mg total) by mouth at bedtime. 09/23/21   Parks Ranger, DO  predniSONE (DELTASONE) 20 MG tablet Take two tablets my mouth once daily in the morning for four days, then one tablet once daily in the morning for four days. 10/22/21   Pleas Koch, NP  senna-docusate (SENOKOT-S) 8.6-50 MG tablet Take 1 tablet by mouth at  bedtime. 09/23/21   Parks Ranger, DO  sertraline (ZOLOFT) 100 MG tablet Take 1 tablet (100 mg total) by mouth daily. 09/24/21   Parks Ranger, DO  sulfamethoxazole-trimethoprim (BACTRIM DS) 800-160 MG tablet Take 1 tablet by mouth 2 (two) times daily. For urinary tract infection. 12/15/21   Pleas Koch, NP  tamsulosin (FLOMAX) 0.4 MG CAPS capsule Take 2 capsules (0.8 mg total) by mouth daily. For urine flow 11/13/21   Pleas Koch, NP  triamcinolone cream (KENALOG) 0.5 % Apply 1 Application topically 2 (two) times daily. 10/02/21   Jinny Sanders, MD      Allergies    Penicillins and Tape    Review of Systems   Review of Systems  Skin:  Positive for wound.  All other systems reviewed and are negative.   Physical Exam Updated Vital Signs BP (!) 133/58   Pulse 86   Temp 98 F (36.7 C) (Oral)   Resp 16   Ht 5' (1.524 m)   Wt 61.7 kg   SpO2 98%   BMI 26.56 kg/m  Physical Exam Vitals and nursing note reviewed.  Constitutional:      General: He is not in acute distress.    Appearance: Normal appearance. He is well-developed. He is not ill-appearing, toxic-appearing or diaphoretic.  HENT:     Head: Normocephalic.  Eyes:     Conjunctiva/sclera: Conjunctivae normal.  Cardiovascular:     Rate and Rhythm: Normal rate and regular rhythm.     Heart sounds: No murmur heard. Pulmonary:     Effort: Pulmonary effort is normal. No respiratory distress.     Breath sounds: Normal breath sounds.  Abdominal:     Palpations: Abdomen is soft.     Tenderness: There is no abdominal tenderness.  Musculoskeletal:        General: No swelling or tenderness. Normal range of motion.     Cervical back: Neck supple.     Right lower leg: No edema.     Left lower leg: No edema.  Skin:    General: Skin is warm and dry.     Capillary Refill: Capillary refill takes less than 2 seconds.     Findings: Lesion present.     Comments: 2 cm vertical linear laceration over the  right eyebrow  Neurological:     General: No focal deficit present.     Mental Status: He is alert and oriented to person, place, and time.     Comments: No unilateral or global weakness, slurred speech, facial asymmetry, pronator drift.  Normal finger-to-nose and heel-to-shin.  Psychiatric:        Mood and Affect: Mood normal.     ED Results / Procedures / Treatments   Labs (all labs ordered are listed, but only abnormal results are displayed) Labs Reviewed  COMPREHENSIVE METABOLIC PANEL - Abnormal; Notable for the  following components:      Result Value   Glucose, Bld 109 (*)    Calcium 8.6 (*)    Total Protein 5.6 (*)    Albumin 3.4 (*)    All other components within normal limits  CBC WITH DIFFERENTIAL/PLATELET - Abnormal; Notable for the following components:   WBC 11.6 (*)    Hemoglobin 12.0 (*)    MCV 75.5 (*)    MCH 22.1 (*)    MCHC 29.3 (*)    RDW 18.0 (*)    Platelets 38 (*)    Monocytes Absolute 2.8 (*)    Abs Immature Granulocytes 0.64 (*)    All other components within normal limits  URINALYSIS, ROUTINE W REFLEX MICROSCOPIC    EKG None  Radiology CT Head Wo Contrast  Result Date: 01/03/2022 CLINICAL DATA:  Head trauma, minor (Age >= 65y) EXAM: CT HEAD WITHOUT CONTRAST TECHNIQUE: Contiguous axial images were obtained from the base of the skull through the vertex without intravenous contrast. RADIATION DOSE REDUCTION: This exam was performed according to the departmental dose-optimization program which includes automated exposure control, adjustment of the mA and/or kV according to patient size and/or use of iterative reconstruction technique. COMPARISON:  09/04/2021 FINDINGS: Brain: No evidence of acute infarction, hemorrhage, hydrocephalus, extra-axial collection or mass lesion/mass effect. Patchy low-density changes within the periventricular and subcortical white matter compatible with chronic microvascular ischemic change. Mild-moderate diffuse cerebral  volume loss. Vascular: No hyperdense vessel or unexpected calcification. Skull: Normal. Negative for fracture or focal lesion. Sinuses/Orbits: Partial opacification involving the posterior right ethmoid air cells. Otherwise clear. Other: Small right supraorbital scalp hematoma. IMPRESSION: 1. No acute intracranial abnormality. 2. Small right supraorbital scalp hematoma. 3. Chronic microvascular ischemic change and cerebral volume loss. Electronically Signed   By: Davina Poke D.O.   On: 01/03/2022 10:15    Procedures .Marland KitchenLaceration Repair  Date/Time: 01/03/2022 2:35 PM  Performed by: Roylene Reason, PA-C Authorized by: Roylene Reason, PA-C   Consent:    Consent obtained:  Verbal   Consent given by:  Patient   Risks discussed:  Poor wound healing and poor cosmetic result   Alternatives discussed:  No treatment Universal protocol:    Procedure explained and questions answered to patient or proxy's satisfaction: yes     Relevant documents present and verified: yes     Imaging studies available: yes     Patient identity confirmed:  Verbally with patient and arm band Anesthesia:    Anesthesia method:  None Laceration details:    Location:  Face   Face location:  Forehead   Length (cm):  2 Pre-procedure details:    Preparation:  Imaging obtained to evaluate for foreign bodies Treatment:    Area cleansed with:  Soap and water Skin repair:    Repair method:  Tissue adhesive Approximation:    Approximation:  Close Repair type:    Repair type:  Simple Post-procedure details:    Dressing:  Open (no dressing)   Procedure completion:  Tolerated     Medications Ordered in ED Medications - No data to display  ED Course/ Medical Decision Making/ A&P Clinical Course as of 01/03/22 1451  Sun Jan 03, 2022  1259 Spoke with daughter and power of attorney [name].  She does not want the patient to be evaluated for admission to assisted living or nursing home at this time.  States  she will follow-up with palliative care, her social worker and primary care for reevaluation and next steps.  Will call them tomorrow. [AS]    Clinical Course User Index [AS] Melvin Whiteford, [place]on Folk, PA-C                           Medical Decision Making This patient presents to the ED for concern of fall, this involves an extensive number of treatment options, and is a complaint that carries with it a high risk of complications and morbidity.  The differential diagnosis includes intracranial abnormality, contusions, fractures, other traumatic injury, UTI, electrolyte abnormalities   Co morbidities that complicate the patient evaluation  numerous medical conditions including but not limited to hypertension, hyperlipidemia, thrombocytopenia, pernicious anemia, vitamin B deficiency, renal insufficiency, depression with suicide attempt in August of this year, family diagnosed dementia  My initial workup includes  Additional history obtained from: Nursing notes from this visit. Family daughter [name] provides a large portion of the history EMS provides a portion of the history  I ordered, reviewed and interpreted labs which include: CBC, CMP, urinalysis   I ordered imaging studies including CT head I independently visualized and interpreted imaging which showed hematoma in the area of the laceration, otherwise normal I agree with the radiologist interpretation  Afebrile, hemodynamically stable.  88 year old male presenting to the ED for evaluation of with facial laceration.  Patient is alert and oriented, he appears to be at his baseline.  There is a 2 cm laceration to the right side of the forehead which was repaired with Dermabond in the ED.  His CT of his head is normal.  CBC with chronic changes, with slight leukocytosis which is greatly improved since 2 months ago.  Known thrombocytopenia.  CMP was unremarkable.  Urinalysis shows no signs of UTI.  He reported a mechanical fall without  prodrome.  He presents with his daughter, [name] who is not his power of attorney who reports that he has had numerous falls recently and she would like him to be admitted to the nursing home or assisted living facility.  Extensive phone conversation was had with his daughter who is the power of attorney, [name], who does not want social work to be consulted in the ED as she does not want the patient to be admitted to a nursing home or assisted living at this time.  She would prefer patient be discharged to home.  She states she will contact palliative care, primary care and social work for appointments tomorrow.  Patient appears overall well despite small traumatic injuries.  We will follow the wishes of the power of attorney and discharge patient home with close follow-up.  Gave patient return precautions.  Stable at discharge.  At this time there does not appear to be any evidence of an acute emergency medical condition and the patient appears stable for discharge with appropriate outpatient follow up. Diagnosis was discussed with patient who verbalizes understanding of care plan and is agreeable to discharge. I have discussed return precautions with patient and daughter [name] who verbalizes understanding. Patient encouraged to follow-up with their PCP within 1 week. All questions answered.  Patient's case discussed with Dr. Johnney Killian who agrees with plan to discharge with follow-up.   Note: Portions of this report may have been transcribed using voice recognition software. Every effort was made to ensure accuracy; however, inadvertent computerized transcription errors may still be present.         Final Clinical Impression(s) / ED Diagnoses Final diagnoses:  Fall, initial encounter  Injury of head, initial  encounter    Rx / DC Orders ED Discharge Orders          Ordered    Ambulatory referral to Neurology       Comments: An appointment is requested in approximately: 1 week   01/03/22  1435              Roylene Reason, Hershal Coria 01/03/22 1451    Charlesetta Shanks, MD 01/04/22 1405    Charlesetta Shanks, MD 01/04/22 [phone]

## 2022-01-04 ENCOUNTER — Telehealth: Payer: Self-pay | Admitting: Primary Care

## 2022-01-04 NOTE — Telephone Encounter (Signed)
Patient daughter called and stated he was seen at the ED and he was told to see a neurologist and she wanted to make sure that was correct and she had questions about patient palliative care.

## 2022-01-05 NOTE — Progress Notes (Addendum)
Initial neurology clinic note  SERVICE DATE: 01/07/22  Reason for Evaluation: Consultation requested by Roylene Reason, PA* for an opinion regarding falls and cognitive changes. My final recommendations will be communicated back to the requesting physician by way of shared medical record or letter to requesting physician via [place] mail.  HPI: This is [name], a 88 y.o. right-handed male with a medical history of HTN, HLD, CAD, thrombocytopenia, renal insufficiency, OA, OSA (not on CPAP), depression s/p suicide attempt in 08/2021, cognitive impairment who presents to neurology clinic with the chief complaint of falls and cognitive changes. The patient is accompanied by daughter, [name].  Patient has been off balance and falling for about 6 months. Patient fell in his yard in 06/2021. He was unable to get up and was found the next day by his neighbor. About 1 week later, a deer tick was found imbedded in his left thigh. The bite was very bad but not a bullseye rash. He was given abx and tested for Lyme (negative). He did get PT at that time.  Patient presented to ED on 01/03/22 for 3 falls in the previous week with one on 01/03/22 in which he struck his forehead. He has a contusion on his skin and black right eye. Daughter [name] provided history at ED of patient exhibiting odd behavior including stopping his electricity because he thought he heard a water pump running.   Palliative care was recently started. They are to be coming out twice a week. Daughter is unclear if he will get therapy.  Patient had a possible UTI and was recently on Keflex prescribed by urology.  He has not been able to get up well and has urinated and defecated on himself of late.  Per [name], patient's other daughter is POA and will not allow patient to go to a facility. There is also concerns that she does not want to spend the money to get the patient the care he needs. I was shown pictures of patient in  floor of house, unable to sit up. He continues to live alone. He falls and does not press his fall button. He falls on average about 1 time per week, but can be as many as 3 times per week. He likely falls more as he is home alone. He has to be called to eat, called to take his medications, does not bathe, was not letting the palliative care people into his home, etc. There are great concerns about his safety at home.   Patient is currently on abilify, zyprexa, gabapentin 200 mg BID  Hematology history per Dr. Libby Maw note from 08/04/21: Hematological/Oncological History # Monocytosis/Thrombocytopenia 10/30/2009: bone marrow biopsy showed hypercellular marrow with trilineage monoparesis. 07/17/2021: Last visit with Dr. Tasia Catchings at [place].  White blood cell 13.2, hemoglobin 15.3, MCV 85.6, platelets of 38.  Absolute monocyte count 3300 08/04/2021: Transfer care to Dr. Lorenso Courier due to physical proximity  On review of the previous records Mr. Borghi is struggled with this for as long as we have records.  His monocytosis and thrombocytopenia are highly fluctuant though platelets rarely drop below less than 30.  Hemoglobin has been steady throughout the course.  White blood cell count rises and falls and more recently has been within normal range.  Absolute monocyte count remains elevated.  He did undergo bone marrow biopsy in 2011 which did not show any clear abnormalities but subsequently has not had any bone marrow biopsies.   # Monocytosis # Thrombocytosis --  Patient notes he would like to avoid bone marrow biopsy and any invasive treatments. --Discussed options moving forward and patient noted he would like supportive care only.  That would entail transfusions if his counts were to drop too low. --Platelets typically remain greater than 30.  Recommend transfusing for hemoglobin less than 8 or platelets of less than 20 --Plan to have the patient return to clinic in 3 months time for repeat lab checks.  If  he were to change his mind 1 bone marrow biopsy would be happy to see him back sooner. --Plan for labs in 3 months time with clinic visit in 6 months.  Patient has extreme itching in his arms and thorax. 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   MEDICATIONS:  Outpatient Encounter Medications as of 01/07/2022  Medication Sig   ARIPiprazole (ABILIFY) 2 MG tablet Take 2 mg by mouth daily.   Artificial Tear Solution (TEARS NATURALE OP) Place 1-2 drops into both eyes at bedtime as needed (dry eyes).   diclofenac Sodium (VOLTAREN ARTHRITIS PAIN) 1 % GEL Apply 2 g topically 3 (three) times daily as needed. (Patient taking differently: Apply 2 g topically 3 (three) times daily as needed (left shoulder and arm pain).)   Ensure (ENSURE) Take 237 mLs by mouth daily. Chocolate   famotidine (PEPCID) 20 MG tablet Take 1 tablet (20 mg total) by mouth daily. For itching.   finasteride (PROSCAR) 5 MG tablet Take 1 tablet (5 mg total) by mouth daily. For urine flow (Patient not taking: Reported on 11/19/2021)   fluticasone (FLONASE) 50 MCG/ACT nasal spray Place 1 spray into both nostrils 2 (two) times daily.   gabapentin (NEURONTIN) 100 MG capsule Take 2 capsules (200 mg total) by mouth 2 (two) times daily.   GEMTESA 75 MG TABS Take 1 tablet by mouth daily.   loratadine (CLARITIN) 10 MG tablet Take 1 tablet (10 mg total) by mouth daily. For allergies (Patient not taking: Reported on 11/19/2021)   OLANZapine (ZYPREXA) 5 MG tablet Take 1 tablet (5 mg total) by mouth at bedtime.   predniSONE (DELTASONE) 20 MG tablet Take two tablets my mouth once daily in the morning for four days, then one tablet once daily in the morning for four days.   senna-docusate (SENOKOT-S) 8.6-50 MG tablet Take 1 tablet by mouth at bedtime.   sertraline (ZOLOFT) 100 MG tablet Take 1 tablet (100 mg total) by mouth daily.   sulfamethoxazole-trimethoprim (BACTRIM DS) 800-160 MG tablet Take 1 tablet by mouth 2 (two) times  daily. For urinary tract infection.   tamsulosin (FLOMAX) 0.4 MG CAPS capsule Take 2 capsules (0.8 mg total) by mouth daily. For urine flow   triamcinolone cream (KENALOG) 0.5 % Apply 1 Application topically 2 (two) times daily.   No facility-administered encounter medications on file as of 01/07/2022.    PAST MEDICAL HISTORY: Past Medical History:  Diagnosis Date   Arthralgia of multiple joints    limited mobility w/  independant adl's   BPH with obstruction/lower urinary tract symptoms    Chronic idiopathic monocytosis    followed by dr Waymon Budge--  per lov note 06/ 2017 persistant unexplained  with normal bone barrow bx   Coronary atherosclerosis of unspecified type of vessel, native or graft    cardiologist-  dr Stanford Breed -- per lov note 11-19-2014 ,  cardiac cath in 2000-- pLAD 60-70%,  small intermediate branch 70-80%,  mRCA 20%,  normal LV   Degenerative arthritis of spine  cervical and lumar   Diverticulosis of colon (without mention of hemorrhage)    Dysuria    chronic   Elevated PSA    Feeling of incomplete bladder emptying    Hiatal hernia    moderate per ct 11/ 2017   History of adenomatous polyp of colon    2008   History of atrial fibrillation    remote hx episode   History of COVID-19 03/05/2020   History of pancreatitis    07-01-2013  and 10-10-2015   History of prostatitis    2014   Hx of colonic polyps    Hyperlipidemia    Hypertension    Mouth sore    roof of mouth sore    Nephrolithiasis    Nocturia more than twice per night    severe  w/ leakage   OSA (obstructive sleep apnea)    INTOLERANT CPAP   Osteoarthritis    Pernicious anemia    B12  Def.   Peyronie disease    Renal insufficiency    Sepsis ([place]) 03/22/2017   Thrombocytopenia, unspecified ([place]) hemotology/oncologist-  dr Waymon Budge   per dr Velta Addison note 06/ 2016  secondary to vitro clumping   Vitamin B deficiency     PAST SURGICAL HISTORY: Past Surgical History:   Procedure Laterality Date   BONE MARROW BIOPSY  2011   normal   CARDIAC CATHETERIZATION  2000   per dr Kathyrn Drown note --  dLAD 60-70%,  small intermediate branch 70-80%,  mRCA 20%,  normal LV   CARDIOVASCULAR STRESS TEST  01-29-2013   dr Stanford Breed   normal nuclear study w/ no ischemia,  normal LV function and wall motion , ef 82%   CATARACT EXTRACTION W/ INTRAOCULAR LENS  IMPLANT, BILATERAL  08/2015   CHOLECYSTECTOMY  11/24/2010   Procedure: LAPAROSCOPIC CHOLECYSTECTOMY WITH INTRAOPERATIVE CHOLANGIOGRAM;  Surgeon: Earnstine Regal, MD;  Location: [facility];  Service: General;  Laterality: N/A;  c-arm   CYSTOSCOPY W/ URETERAL STENT PLACEMENT Left 12/17/2015   Procedure: CYSTOSCOPY WITH RETROGRADE PYELOGRAM/URETERAL STENT PLACEMENT;  Surgeon: Ardis Hughs, MD;  Location: [facility];  Service: Urology;  Laterality: Left;   CYSTOSCOPY WITH RETROGRADE PYELOGRAM, URETEROSCOPY AND STENT PLACEMENT Left 12/22/2015   Procedure: CYSTOSCOPY WITH LEFT RETROGRADE  URETEROSCOPY AND STENT PLACEMENT;  Surgeon: Carolan Clines, MD;  Location: [facility];  Service: Urology;  Laterality: Left;   ERCP N/A 07/04/2013   Procedure: ENDOSCOPIC RETROGRADE CHOLANGIOPANCREATOGRAPHY (ERCP);  Surgeon: Gatha Mayer, MD;  Location: [facility];  Service: Endoscopy;  Laterality: N/A;  MAC if available   ERCP N/A 10/12/2015   Procedure: ENDOSCOPIC RETROGRADE CHOLANGIOPANCREATOGRAPHY (ERCP);  Surgeon: [name], MD;  Location: [facility];  Service: Endoscopy;  Laterality: N/A;   HOLMIUM LASER APPLICATION Left 86/01/6835   Procedure: HOLMIUM LASER APPLICATION;  Surgeon: Carolan Clines, MD;  Location: [facility];  Service: Urology;  Laterality: Left;   INGUINAL HERNIA REPAIR Right 01/25/2002   KNEE ARTHROSCOPY  x5   SATURATION BIOPSY OF PROSTATE  05-29-2007  and 01-26-2008   SHOULDER ARTHROSCOPY WITH OPEN ROTATOR CUFF REPAIR AND DISTAL CLAVICLE ACROMINECTOMY Right 11/19/2004   TOTAL HIP  ARTHROPLASTY Right 05/21/2009   TOTAL KNEE ARTHROPLASTY Bilateral left 05-02-2003/  right  03-23-2010   TRANSTHORACIC ECHOCARDIOGRAM  12/23/2004   ef 60%, mild MV calcification without stenosis/  mild TR,  PASP 52mHg   UVULOPALATOPHARYNGOPLASTY  1993    w/  T & A    ALLERGIES: Allergies  Allergen  Reactions   Penicillins Hives and Other (See Comments)    Whelps, passed out Tolerates cephalosporins  Has patient had a PCN reaction causing immediate rash, facial/tongue/throat swelling, SOB or lightheadedness with hypotension:  yes Has patient had a PCN reaction causing severe rash involving mucus membranes or skin necrosis: no Has patient had a PCN reaction that required hospitalization: no Has patient had a PCN reaction occurring within the last 10 years: no If all of the above answers are "NO", then may proceed with Cephalosporin use.    Tape Hives and Other (See Comments)    Paper Tape    FAMILY HISTORY: Family History  Problem Relation Age of Onset   Peripheral vascular disease Mother    Colon cancer Father 37   Breast cancer Daughter    Breast cancer Daughter     SOCIAL HISTORY: Social History   Tobacco Use   Smoking status: Former    Types: Pipe    Quit date: 01/18/1966    Years since quitting: 56.0   Smokeless tobacco: Never  Vaping Use   Vaping Use: Never used  Substance Use Topics   Alcohol use: No    Alcohol/week: 0.0 standard drinks of alcohol   Drug use: No   Social History   Social History Narrative   DNR   Widowed   2 daughters leave near by   Right handed      OBJECTIVE: PHYSICAL EXAM: BP 110/67   Pulse (!) 104   Ht 5' (1.524 m)   Wt 134 lb 3.2 oz (60.9 kg)   SpO2 93%   BMI 26.21 kg/m   General: General appearance: Awake and alert. No distress. Cooperative with exam.  HEENT: Contusion above and around right eye. Lungs: Non-labored breathing on room air  Extremities: Scar tissue on left inner thigh at prior tick bite site. Bruising  and cuts on all extremities.  Neurological: Mental Status: Alert. Speech fluent. Pleasantly confused. Tells stories that per daughter did not occur (such as falling in a hardware store and no one helping him up - he has not been out to a hardware store) No pseudobulbar affect Cranial Nerves: CNII: No RAPD.  CNIII, IV, VI: PERRL. No nystagmus. EOMI. CN V: Facial sensation intact bilaterally to fine touch. CN VII: Facial muscles symmetric and strong. No ptosis at rest CN VIII: Hard of hearing, but able to hear when speaking up CN IX: No hypophonia. CN X: Palate elevates symmetrically. CN XI: Full strength shoulder shrug bilaterally. CN XII: Tongue protrusion full and midline. No atrophy or fasciculations. No significant dysarthria Motor: Tone is normal.  Individual muscle group testing (MRC grade out of 5):  Movement     Neck flexion 5    Neck extension 5     Right Left   Shoulder abduction 5 5   Elbow flexion 5 5   Elbow extension 5 5   Finger abduction - FDI 5 5   Finger abduction - ADM 5 5   Finger extension 5 5   Finger distal flexion 5 5    Hip flexion 5 5   Hip extension 5 5   Hip adduction 5 5   Hip abduction 5 5   Knee extension 5 5   Knee flexion 5 5   Dorsiflexion 5 5   Plantarflexion 5 5     Reflexes:  Right Left   Bicep 2+ 2+   Tricep 2+ 2+   BrRad 2+ 2+   Knee 1+ 1+  Ankle 0 0    Pathological Reflexes: Babinski: flexor response bilaterally Hoffman: absent bilaterally Troemner: absent bilaterally Sensation: Pinprick: Intact in upper extremities. Patient inconsistently states lower extremities have diminished sensation. Coordination: Intact finger-to- nose-finger bilaterally. Romberg with mild sway. Gait: Slow, unsteady gait with walker. Shuffles his feet when walking.  Lab and Test Review: Internal labs: 01/03/22: UA: wnl CBC significant for platelets of 38 (chronically in 40s-90s) CMP significant for albumin of 3.4  HbA1c (09/21/21):  4.8 TSH (09/05/21): 0.300  07/17/21: Kappa/lambda light chains: wnl IFE: no monoclonal antibody HIV non-reactive Folate wnl B12: 1691  CK (07/03/21): 106  Imaging: CT head wo contrast (01/03/22): FINDINGS: Brain: No evidence of acute infarction, hemorrhage, hydrocephalus, extra-axial collection or mass lesion/mass effect. Patchy low-density changes within the periventricular and subcortical white matter compatible with chronic microvascular ischemic change. Mild-moderate diffuse cerebral volume loss.   Vascular: No hyperdense vessel or unexpected calcification.   Skull: Normal. Negative for fracture or focal lesion.   Sinuses/Orbits: Partial opacification involving the posterior right ethmoid air cells. Otherwise clear.   Other: Small right supraorbital scalp hematoma.   IMPRESSION: 1. No acute intracranial abnormality. 2. Small right supraorbital scalp hematoma. 3. Chronic microvascular ischemic change and cerebral volume loss.  MRI brain wo contrast (09/04/21): FINDINGS: Brain: No restricted diffusion to suggest acute or subacute infarct. No acute hemorrhage, mass, mass effect, or midline shift. No hemosiderin deposition to suggest remote hemorrhage. Scattered and confluent T2 hyperintense signal in the periventricular white matter and pons, likely the sequela of moderate to severe chronic small vessel ischemic disease. Remote lacunar infarct in the left thalamus.   Vascular: Normal arterial flow voids.   Skull and upper cervical spine: Normal marrow signal.   Sinuses/Orbits: No acute finding. Status post bilateral lens replacements.   Other: The mastoids are well aerated.   IMPRESSION: No acute intracranial process. No evidence of acute or subacute infarct. No etiology is seen for the patient's altered mental status  CT cervical spine wo contrast (07/03/21): CT CERVICAL SPINE FINDINGS   Alignment: Facet joints are aligned without dislocation or  traumatic listhesis. Dens and lateral masses are aligned. Degenerative grade 1 anterolisthesis of C2 on C3 and C7 on T1, unchanged.   Skull base and vertebrae: No acute fracture. No primary bone lesion or focal pathologic process.   Soft tissues and spinal canal: No prevertebral fluid or swelling. No visible canal hematoma.   Disc levels: Advanced multilevel degenerative disc disease and facet arthropathy throughout the cervical spine, not appreciably changed from prior.   Upper chest: Included lung apices are clear.   Other: Aortic atherosclerosis.   ASSESSMENT: [name] is a 88 y.o. male who presents for evaluation of falls and cognitive change. He has a relevant medical history of HTN, HLD, CAD, thrombocytopenia, renal insufficiency, OA, OSA (not on CPAP), depression s/p suicide attempt in 08/2021, cognitive impairment. His neurological examination is pertinent for normal strength, normal reflexes for age, and ?sensory loss. There is no evidence of parkinsonism or focal deficit. While today's appointment focused on falls and patient safety, he does exhibit signs of significant cognitive impairment, especially around recent events. More distant memories are well recalled. Available diagnostic data is significant for recent CT and MRI brain showing significant diffuse cerebral atrophy. HbA1c, B12, and IFE were all normal about 6 months ago.   The etiology of patient's symptoms are likely multifactorial with contributions from OA, deconditioning, and poor nutrition. Depression and untreated OSA could also be playing a factor  in patient's cognitive change. There could also be a contribution from neuropathy, but given the inability to assess sensation well, this is not clear. His falls are concerning because patient continues to live along with home health coming a few times per week to help. It appears that his daughter [name] calls him frequently to remind him to take his medications and  to eat, but it is unclear if he is doing either consistently. It is also concerning that he seems to manage and have access to all of his medications given his recent suicide attempt. He would no doubt benefit from 24/7 care given his cognitive impairment and gait instability. There has been disagreement within the family about the care that patient would benefit from, but in my assessment, it appears patient is not safe in his current environment and cannot be fully trusted to take care of himself. I expressed these concerns to patient's daughter, [name], and to patient's PCP via electronic correspondence. PCP shared my concerns.  I will send labs today for treatable causes of cognitive changes and possible neuropathy.  PLAN: -Blood work: B1, B6, B12 -Patient is getting PT through home health care per PCP. Recommend this to continue. -Strongly advise that patient not live independently at this time as it is not safe for him to do so given falls, suicide attempt, and inability to take care of himself. -Will consider expanding work up pending lab results and follow up examination  -Return to clinic in 2 months  The impression above as well as the plan as outlined below were extensively discussed with the patient (in the company of daughter, [name]) who voiced understanding. All questions were answered to their satisfaction.  The patient was counseled on pertinent fall precautions per the printed material provided today, and as noted under the "Patient Instructions" section below.  When available, results of the above investigations and possible further recommendations will be communicated to the patient via telephone/MyChart. Patient to call office if not contacted after expected testing turnaround time.   Total time spent reviewing records, interview, history/exam, documentation, and coordination of care on day of encounter:  100 min   Thank you for allowing me to participate in patient's care.   If I can answer any additional questions, I would be pleased to do so.  Kai Levins, MD   CC: Pleas Koch, NP [place] [id number]  CC: Referring provider: Roylene Reason, PA-C [address] [place],  [place] [address]

## 2022-01-05 NOTE — Telephone Encounter (Signed)
Called patient daughter [name], and she was requesting the information regarding who the palliative care order was sent to. I advised it was sent to [facility]. Also advised that the referral placed in the hospital was sent to [facility] in [place].

## 2022-01-06 ENCOUNTER — Telehealth: Payer: Self-pay | Admitting: *Deleted

## 2022-01-06 NOTE — Patient Outreach (Signed)
  Care Coordination [facility] Note Transition Care Management Unsuccessful Follow-up Telephone Call  Date of discharge and from where:  [facility] ED [id number]  Attempts:  1st Attempt  Reason for unsuccessful TCM follow-up call:  Left voice message  [facility] Management [phone]

## 2022-01-07 ENCOUNTER — Ambulatory Visit: Payer: Medicare HMO | Admitting: Neurology

## 2022-01-07 ENCOUNTER — Other Ambulatory Visit: Payer: Medicare HMO

## 2022-01-07 ENCOUNTER — Encounter: Payer: Self-pay | Admitting: Neurology

## 2022-01-07 VITALS — BP 110/67 | HR 104 | Ht 60.0 in | Wt 134.2 lb

## 2022-01-07 DIAGNOSIS — S0990XS Unspecified injury of head, sequela: Secondary | ICD-10-CM

## 2022-01-07 DIAGNOSIS — R2681 Unsteadiness on feet: Secondary | ICD-10-CM

## 2022-01-07 DIAGNOSIS — R410 Disorientation, unspecified: Secondary | ICD-10-CM | POA: Diagnosis not present

## 2022-01-07 DIAGNOSIS — R5381 Other malaise: Secondary | ICD-10-CM

## 2022-01-07 DIAGNOSIS — R296 Repeated falls: Secondary | ICD-10-CM

## 2022-01-07 DIAGNOSIS — R4189 Other symptoms and signs involving cognitive functions and awareness: Secondary | ICD-10-CM

## 2022-01-07 DIAGNOSIS — R21 Rash and other nonspecific skin eruption: Secondary | ICD-10-CM

## 2022-01-07 DIAGNOSIS — F332 Major depressive disorder, recurrent severe without psychotic features: Secondary | ICD-10-CM | POA: Diagnosis not present

## 2022-01-07 NOTE — Patient Instructions (Signed)
I would like to get lab work today to look for causes for imbalance and cognitive changes.  I will also reach our to Alma Friendly, primary care, to discuss physical therapy and goals of care.  I will be in touch when I have results and further directions for care.  The physicians and staff at [facility] are committed to providing excellent care. You may receive a survey requesting feedback about your experience at our office. We strive to receive "very good" responses to the survey questions. If you feel that your experience would prevent you from giving the office a "very good " response, please contact our office to try to remedy the situation. We may be reached at [phone]. Thank you for taking the time out of your busy day to complete the survey.  Kai Levins, MD [facility]  Preventing Falls at [facility] are common, often dreaded events in the lives of older people. Aside from the obvious injuries and even death that may result, fall can cause wide-ranging consequences including loss of independence, mental decline, decreased activity and mobility. Younger people are also at risk of falling, especially those with chronic illnesses and fatigue.  Ways to reduce risk for falling Examine diet and medications. Warm foods and alcohol dilate blood vessels, which can lead to dizziness when standing. Sleep aids, antidepressants and pain medications can also increase the likelihood of a fall.  Get a vision exam. Poor vision, cataracts and glaucoma increase the chances of falling.  Check foot gear. Shoes should fit snugly and have a sturdy, nonskid sole and a broad, low heel  Participate in a physician-approved exercise program to build and maintain muscle strength and improve balance and coordination. Programs that use ankle weights or stretch bands are excellent for muscle-strengthening. Water aerobics programs and low-impact Tai Chi programs have also been shown to improve  balance and coordination.  Increase vitamin D intake. Vitamin D improves muscle strength and increases the amount of calcium the body is able to absorb and deposit in bones.  How to prevent falls from common hazards Floors - Remove all loose wires, cords, and throw rugs. Minimize clutter. Make sure rugs are anchored and smooth. Keep furniture in its usual place.  Chairs -- Use chairs with straight backs, armrests and firm seats. Add firm cushions to existing pieces to add height.  Bathroom - Install grab bars and non-skid tape in the tub or shower. Use a bathtub transfer bench or a shower chair with a back support Use an elevated toilet seat and/or safety rails to assist standing from a low surface. Do not use towel racks or bathroom tissue holders to help you stand.  Lighting - Make sure halls, stairways, and entrances are well-lit. Install a night light in your bathroom or hallway. Make sure there is a light switch at the top and bottom of the staircase. Turn lights on if you get up in the middle of the night. Make sure lamps or light switches are within reach of the bed if you have to get up during the night.  Kitchen - Install non-skid rubber mats near the sink and stove. Clean spills immediately. Store frequently used utensils, pots, pans between waist and eye level. This helps prevent reaching and bending. Sit when getting things out of lower cupboards.  Living room/ Bedrooms - Place furniture with wide spaces in between, giving enough room to move around. Establish a route through the living room that gives you something to hold onto  as you walk.  Stairs - Make sure treads, rails, and rugs are secure. Install a rail on both sides of the stairs. If stairs are a threat, it might be helpful to arrange most of your activities on the lower level to reduce the number of times you must climb the stairs.  Entrances and doorways - Install metal handles on the walls adjacent to the doorknobs of all  doors to make it more secure as you travel through the doorway.  Tips for maintaining balance Keep at least one hand free at all times. Try using a backpack or fanny pack to hold things rather than carrying them in your hands. Never carry objects in both hands when walking as this interferes with keeping your balance.  Attempt to swing both arms from front to back while walking. This might require a conscious effort if Parkinson's disease has diminished your movement. It will, however, help you to maintain balance and posture, and reduce fatigue.  Consciously lift your feet off of the ground when walking. Shuffling and dragging of the feet is a common culprit in losing your balance.  When trying to navigate turns, use a "U" technique of facing forward and making a wide turn, rather than pivoting sharply.  Try to stand with your feet shoulder-length apart. When your feet are close together for any length of time, you increase your risk of losing your balance and falling.  Do one thing at a time. Don't try to walk and accomplish another task, such as reading or looking around. The decrease in your automatic reflexes complicates motor function, so the less distraction, the better.  Do not wear rubber or gripping soled shoes, they might "catch" on the floor and cause tripping.  Move slowly when changing positions. Use deliberate, concentrated movements and, if needed, use a grab bar or walking aid. Count 15 seconds between each movement. For example, when rising from a seated position, wait 15 seconds after standing to begin walking.  If balance is a continuous problem, you might want to consider a walking aid such as a cane, walking stick, or walker. Once you've mastered walking with help, you might be ready to try it on your own again.

## 2022-01-08 ENCOUNTER — Telehealth: Payer: Self-pay | Admitting: *Deleted

## 2022-01-08 LAB — VITAMIN B12: Vitamin B-12: 537 pg/mL (ref 200–1100)

## 2022-01-08 NOTE — Patient Outreach (Signed)
  Care Coordination [facility] Note Transition Care Management Follow-up Telephone Call Date of discharge and from where: [facility] [id number] How have you been since you were released from the hospital?  He has some confusion Any questions or concerns? Yes HHC came out the patient didn't answer the door and they left a wash basin and supplies in the door. They don't know the company and the Dr told them the [facility] they sent the referral to. They called the agency in [place] and they don't know the pt. Daughter is worried because the patient needs the assistance and if they let her know when they are coming she would make sure he opened the door.  RN gave her the local agency number to follow up with.   Items Reviewed: Did the pt receive and understand the discharge instructions provided? Yes  Medications obtained and verified? Yes The daughter did Other? No  Any new allergies since your discharge? No  Dietary orders reviewed? No Do you have support at home? Yes   Home Care and Equipment/Supplies: Were home health services ordered? yes If so, what is the name of the agency? Patient though [facility] but they told her they didn't have him listed  Has the agency set up a time to come to the patient's home? The patient didn't answer the door. A bath kit was placed in the door Were any new equipment or medical supplies ordered?  No What is the name of the medical supply agency? N. Were you able to get the supplies/equipment? no Do you have any questions related to the use of the equipment or supplies? No  Functional Questionnaire: (I = Independent and D = Dependent) ADLs: D  Bathing/Dressing- D  Meal Prep- D  Eating- I  Maintaining continence- D  Transferring/Ambulation- I  Managing Meds- D  Follow up appointments reviewed:  PCP Hospital f/u appt confirmed? Yes  Alma Friendly [id number] 9:00 Specialist Hospital f/u appt confirmed? Hayden Pedro [id number] 3:00. Are transportation arrangements  needed? No  If their condition worsens, is the pt aware to call PCP or go to the Emergency Dept.? Yes Was the patient provided with contact information for the PCP's office or ED? Yes Was to pt encouraged to call back with questions or concerns? Yes  SDOH assessments and interventions completed:   Yes SDOH Screenings   Food Insecurity: No Food Insecurity (01/08/2022)  Housing: Low Risk  (09/23/2021)  Transportation Needs: No Transportation Needs (01/08/2022)  Utilities: Not At Risk (09/23/2021)  Alcohol Screen: Low Risk  (09/17/2021)  Depression (PHQ2-9): High Risk (08/21/2021)  Financial Resource Strain: Low Risk  (08/21/2021)  Physical Activity: Inactive (08/21/2021)  Stress: Stress Concern Present (08/21/2021)  Tobacco Use: Medium Risk (01/07/2022)      Care Coordination Interventions:  RN discussed the meals on wheels and moms meals. RN gave daughter the local number listed for [facility]/    Encounter Outcome:  Pt. Visit Completed    [facility] [phone]

## 2022-01-12 ENCOUNTER — Telehealth: Payer: Self-pay | Admitting: Primary Care

## 2022-01-12 NOTE — Telephone Encounter (Signed)
[name] from [facility] called over and wanting to get more records on [name]. She stated the information could be faxed over to [phone]. Thank you!

## 2022-01-13 ENCOUNTER — Telehealth: Payer: Self-pay

## 2022-01-13 LAB — VITAMIN B6: Vitamin B6: 8.6 ng/mL (ref 2.1–21.7)

## 2022-01-13 LAB — VITAMIN B1: Vitamin B1 (Thiamine): 20 nmol/L (ref 8–30)

## 2022-01-13 NOTE — Telephone Encounter (Signed)
     Patient  visit on 12/18  at [place] you been able to follow up with your primary care physician? Yes   The patient was or was not able to obtain any needed medicine or equipment. Yes   Are there diet recommendations that you are having difficulty following? NA  Patient expresses understanding of discharge instructions and education provided has no other needs at this time.  Yes      [place], [facility], Care Management  [phone] [address], [place], [place] [address] Phone: [phone] Email: [email or web address]

## 2022-01-14 NOTE — Telephone Encounter (Signed)
Called and spoke to [place] from [facility] and clarified what records she needed. Faxed over the requested information; med list, demographics, last ov visit, problem list.

## 2022-01-19 ENCOUNTER — Telehealth: Payer: Self-pay | Admitting: Neurology

## 2022-01-19 NOTE — Telephone Encounter (Signed)
Spoke to patient's daughter, [name], who came with patient to recent appointment. Per [name], patient is getting hospice care at home, but not 24/7 supervision. Per [name], hospice has taken over care.  There continues to be concerns about his care, not taking care of himself, having poor communication with family. He does better when he is with [name] per her report. [name] and I expressed our concern about his safety being by himself.  All labs were normal. [name] will discuss with sister and PCP. I recommended following up with me in about 1 month if that is the direction of care they would like to pursue. [name] will follow up with [place] about this.  All questions were answered.  Kai Levins, MD [facility]

## 2022-01-29 ENCOUNTER — Other Ambulatory Visit: Payer: Self-pay | Admitting: Primary Care

## 2022-01-29 DIAGNOSIS — R21 Rash and other nonspecific skin eruption: Secondary | ICD-10-CM

## 2022-02-01 ENCOUNTER — Telehealth: Payer: Self-pay

## 2022-02-01 NOTE — Telephone Encounter (Signed)
Daughter called to cancel upcoming [place] appointments. Patient has entered hospice care.

## 2022-02-04 ENCOUNTER — Ambulatory Visit: Payer: Medicare HMO | Admitting: Hematology and Oncology

## 2022-02-04 ENCOUNTER — Other Ambulatory Visit: Payer: Medicare HMO

## 2022-02-08 ENCOUNTER — Telehealth: Payer: Self-pay | Admitting: Primary Care

## 2022-02-08 NOTE — Telephone Encounter (Signed)
Spoke with [name] (daughter).  She states his Medicare is now under hospice and  they are being told if he does the shoulder injection it would be considered curative and not comfort care. So she would like to know if the family brings him to the office for the injection and pays out of pocket for it,  how much would it cost??  Will forward message to [facility] who does how billing to see if she can give up a price.    I also mentioned to [name],  that my other concern would be if hospice learns that he came in and did the injection, since they considerate it curative that he may not qualify for hospice anymore.    Will also send to [name] for input since she use to work with hospice.

## 2022-02-08 NOTE — Telephone Encounter (Signed)
Can you call back  Since he has Medicare, it should not be an issue.  Medicare should cover a procedure like that just with an office co-pay.  I have never known them not to.   I will cc: Amy and Mickel Baas to make sure that they agree, as well.

## 2022-02-08 NOTE — Telephone Encounter (Signed)
Patient daughter called and stated patient has seen Dr. Lorelei Pont for a Cortizone shot and he is in hospice care  and hospice won't cover it and wanted to know what would price of out of pocket expense. Call back number [phone].

## 2022-02-09 ENCOUNTER — Telehealth: Payer: Self-pay | Admitting: Anesthesiology

## 2022-02-09 NOTE — Telephone Encounter (Signed)
Pt's daughter Lemmie Evens [facility]) called stating she would like to speak Dr Berdine Addison and office manager regarding what she believes to be erroneous medical information on pt's chart from last office visit.

## 2022-02-09 NOTE — Telephone Encounter (Signed)
Patient's daughter [name] who is the POA for her father [name] called today expressing concerns about documentation in the 01/07/22 office visit. [name] states that when [name] attended the [place] on 01/07/22 that she did not give factual information regarding patient. [name] stated that patient is not currently taking the Zyprexa and has not done for a while now. She would like this removed from his current medication list. [name] would like an opportunity to speak with Dr. Berdine Addison as she has concerns about statements made by [name] during the 01/07/22 office visit. She stated that in home Hospice care for her father has increased and that her father is getting the care that he needs. I informed patient that I will send a note to Dr. Berdine Addison and make him aware that [name] called expressing concerns with the information that [name] shared during that 01/07/22 OV. Patient verbalized understanding.

## 2022-02-09 NOTE — Telephone Encounter (Signed)
Tried calling daughter back, No answer. LMOVM to call us back to add more details for discussion.

## 2022-02-10 ENCOUNTER — Emergency Department (HOSPITAL_COMMUNITY): Payer: Medicare Other

## 2022-02-10 ENCOUNTER — Other Ambulatory Visit: Payer: Self-pay

## 2022-02-10 ENCOUNTER — Inpatient Hospital Stay (HOSPITAL_COMMUNITY)
Admission: EM | Admit: 2022-02-10 | Discharge: 2022-02-12 | DRG: 291 | Disposition: A | Discharge status: Hospice | Payer: Medicare Other | Attending: Internal Medicine | Admitting: Internal Medicine

## 2022-02-10 ENCOUNTER — Encounter (HOSPITAL_COMMUNITY): Payer: Self-pay

## 2022-02-10 DIAGNOSIS — G4733 Obstructive sleep apnea (adult) (pediatric): Secondary | ICD-10-CM | POA: Diagnosis present

## 2022-02-10 DIAGNOSIS — I2510 Atherosclerotic heart disease of native coronary artery without angina pectoris: Secondary | ICD-10-CM | POA: Diagnosis present

## 2022-02-10 DIAGNOSIS — Z66 Do not resuscitate: Secondary | ICD-10-CM | POA: Diagnosis present

## 2022-02-10 DIAGNOSIS — Z87891 Personal history of nicotine dependence: Secondary | ICD-10-CM | POA: Diagnosis not present

## 2022-02-10 DIAGNOSIS — M25521 Pain in right elbow: Secondary | ICD-10-CM | POA: Diagnosis not present

## 2022-02-10 DIAGNOSIS — E785 Hyperlipidemia, unspecified: Secondary | ICD-10-CM | POA: Diagnosis present

## 2022-02-10 DIAGNOSIS — L89151 Pressure ulcer of sacral region, stage 1: Secondary | ICD-10-CM | POA: Diagnosis not present

## 2022-02-10 DIAGNOSIS — R9431 Abnormal electrocardiogram [ECG] [EKG]: Secondary | ICD-10-CM | POA: Diagnosis not present

## 2022-02-10 DIAGNOSIS — Z7401 Bed confinement status: Secondary | ICD-10-CM | POA: Diagnosis not present

## 2022-02-10 DIAGNOSIS — F0393 Unspecified dementia, unspecified severity, with mood disturbance: Secondary | ICD-10-CM | POA: Diagnosis present

## 2022-02-10 DIAGNOSIS — F419 Anxiety disorder, unspecified: Secondary | ICD-10-CM | POA: Diagnosis present

## 2022-02-10 DIAGNOSIS — Z515 Encounter for palliative care: Secondary | ICD-10-CM | POA: Diagnosis not present

## 2022-02-10 DIAGNOSIS — S0990XA Unspecified injury of head, initial encounter: Secondary | ICD-10-CM | POA: Diagnosis not present

## 2022-02-10 DIAGNOSIS — F0394 Unspecified dementia, unspecified severity, with anxiety: Secondary | ICD-10-CM | POA: Diagnosis present

## 2022-02-10 DIAGNOSIS — R531 Weakness: Secondary | ICD-10-CM | POA: Diagnosis not present

## 2022-02-10 DIAGNOSIS — C9210 Chronic myeloid leukemia, BCR/ABL-positive, not having achieved remission: Secondary | ICD-10-CM | POA: Diagnosis present

## 2022-02-10 DIAGNOSIS — I5031 Acute diastolic (congestive) heart failure: Secondary | ICD-10-CM | POA: Diagnosis present

## 2022-02-10 DIAGNOSIS — R5381 Other malaise: Secondary | ICD-10-CM | POA: Diagnosis present

## 2022-02-10 DIAGNOSIS — Z9981 Dependence on supplemental oxygen: Secondary | ICD-10-CM | POA: Diagnosis not present

## 2022-02-10 DIAGNOSIS — K219 Gastro-esophageal reflux disease without esophagitis: Secondary | ICD-10-CM | POA: Diagnosis present

## 2022-02-10 DIAGNOSIS — M47812 Spondylosis without myelopathy or radiculopathy, cervical region: Secondary | ICD-10-CM | POA: Diagnosis not present

## 2022-02-10 DIAGNOSIS — Z789 Other specified health status: Secondary | ICD-10-CM

## 2022-02-10 DIAGNOSIS — Z96641 Presence of right artificial hip joint: Secondary | ICD-10-CM | POA: Diagnosis present

## 2022-02-10 DIAGNOSIS — Z96653 Presence of artificial knee joint, bilateral: Secondary | ICD-10-CM | POA: Diagnosis present

## 2022-02-10 DIAGNOSIS — F32A Depression, unspecified: Secondary | ICD-10-CM | POA: Diagnosis present

## 2022-02-10 DIAGNOSIS — Z803 Family history of malignant neoplasm of breast: Secondary | ICD-10-CM

## 2022-02-10 DIAGNOSIS — Z9049 Acquired absence of other specified parts of digestive tract: Secondary | ICD-10-CM

## 2022-02-10 DIAGNOSIS — E876 Hypokalemia: Secondary | ICD-10-CM | POA: Diagnosis present

## 2022-02-10 DIAGNOSIS — Z8616 Personal history of COVID-19: Secondary | ICD-10-CM | POA: Diagnosis not present

## 2022-02-10 DIAGNOSIS — G9341 Metabolic encephalopathy: Secondary | ICD-10-CM | POA: Diagnosis present

## 2022-02-10 DIAGNOSIS — N401 Enlarged prostate with lower urinary tract symptoms: Secondary | ICD-10-CM | POA: Diagnosis present

## 2022-02-10 DIAGNOSIS — S4992XA Unspecified injury of left shoulder and upper arm, initial encounter: Secondary | ICD-10-CM | POA: Diagnosis present

## 2022-02-10 DIAGNOSIS — W19XXXA Unspecified fall, initial encounter: Secondary | ICD-10-CM | POA: Diagnosis not present

## 2022-02-10 DIAGNOSIS — Y92009 Unspecified place in unspecified non-institutional (private) residence as the place of occurrence of the external cause: Secondary | ICD-10-CM | POA: Diagnosis not present

## 2022-02-10 DIAGNOSIS — S51811A Laceration without foreign body of right forearm, initial encounter: Secondary | ICD-10-CM | POA: Diagnosis present

## 2022-02-10 DIAGNOSIS — R7989 Other specified abnormal findings of blood chemistry: Secondary | ICD-10-CM

## 2022-02-10 DIAGNOSIS — D509 Iron deficiency anemia, unspecified: Secondary | ICD-10-CM | POA: Diagnosis present

## 2022-02-10 DIAGNOSIS — M7989 Other specified soft tissue disorders: Secondary | ICD-10-CM | POA: Diagnosis not present

## 2022-02-10 DIAGNOSIS — I509 Heart failure, unspecified: Secondary | ICD-10-CM | POA: Diagnosis not present

## 2022-02-10 DIAGNOSIS — R000 Tachycardia, unspecified: Secondary | ICD-10-CM | POA: Diagnosis not present

## 2022-02-10 DIAGNOSIS — D696 Thrombocytopenia, unspecified: Secondary | ICD-10-CM | POA: Diagnosis present

## 2022-02-10 DIAGNOSIS — Z9841 Cataract extraction status, right eye: Secondary | ICD-10-CM

## 2022-02-10 DIAGNOSIS — R079 Chest pain, unspecified: Secondary | ICD-10-CM | POA: Diagnosis not present

## 2022-02-10 DIAGNOSIS — I10 Essential (primary) hypertension: Secondary | ICD-10-CM | POA: Diagnosis not present

## 2022-02-10 DIAGNOSIS — Z79899 Other long term (current) drug therapy: Secondary | ICD-10-CM | POA: Diagnosis not present

## 2022-02-10 DIAGNOSIS — Z9842 Cataract extraction status, left eye: Secondary | ICD-10-CM

## 2022-02-10 DIAGNOSIS — I110 Hypertensive heart disease with heart failure: Principal | ICD-10-CM | POA: Diagnosis present

## 2022-02-10 DIAGNOSIS — N400 Enlarged prostate without lower urinary tract symptoms: Secondary | ICD-10-CM | POA: Diagnosis present

## 2022-02-10 DIAGNOSIS — M19012 Primary osteoarthritis, left shoulder: Secondary | ICD-10-CM | POA: Diagnosis not present

## 2022-02-10 DIAGNOSIS — Z8249 Family history of ischemic heart disease and other diseases of the circulatory system: Secondary | ICD-10-CM

## 2022-02-10 DIAGNOSIS — M419 Scoliosis, unspecified: Secondary | ICD-10-CM | POA: Diagnosis not present

## 2022-02-10 DIAGNOSIS — R102 Pelvic and perineal pain: Secondary | ICD-10-CM | POA: Diagnosis not present

## 2022-02-10 DIAGNOSIS — R0902 Hypoxemia: Secondary | ICD-10-CM | POA: Diagnosis not present

## 2022-02-10 DIAGNOSIS — Z86010 Personal history of colonic polyps: Secondary | ICD-10-CM

## 2022-02-10 DIAGNOSIS — T699XXA Effect of reduced temperature, unspecified, initial encounter: Secondary | ICD-10-CM | POA: Diagnosis not present

## 2022-02-10 DIAGNOSIS — Z888 Allergy status to other drugs, medicaments and biological substances status: Secondary | ICD-10-CM

## 2022-02-10 DIAGNOSIS — I4891 Unspecified atrial fibrillation: Secondary | ICD-10-CM | POA: Diagnosis present

## 2022-02-10 DIAGNOSIS — Z880 Allergy status to penicillin: Secondary | ICD-10-CM

## 2022-02-10 DIAGNOSIS — M4312 Spondylolisthesis, cervical region: Secondary | ICD-10-CM | POA: Diagnosis not present

## 2022-02-10 DIAGNOSIS — M85831 Other specified disorders of bone density and structure, right forearm: Secondary | ICD-10-CM | POA: Diagnosis not present

## 2022-02-10 DIAGNOSIS — Z961 Presence of intraocular lens: Secondary | ICD-10-CM | POA: Diagnosis present

## 2022-02-10 DIAGNOSIS — Z800 Family history of malignant neoplasm of digestive organs: Secondary | ICD-10-CM

## 2022-02-10 LAB — BASIC METABOLIC PANEL
Anion gap: 9 (ref 5–15)
BUN: 16 mg/dL (ref 8–23)
CO2: 24 mmol/L (ref 22–32)
Calcium: 8.4 mg/dL — ABNORMAL LOW (ref 8.9–10.3)
Chloride: 107 mmol/L (ref 98–111)
Creatinine, Ser: 0.99 mg/dL (ref 0.61–1.24)
GFR, Estimated: >60 mL/min (ref >60)
Glucose, Bld: 95 mg/dL (ref 70–99)
Potassium: 3.4 mmol/L — ABNORMAL LOW (ref 3.5–5.1)
Sodium: 140 mmol/L (ref 135–145)

## 2022-02-10 LAB — URINALYSIS, ROUTINE W REFLEX MICROSCOPIC
Bilirubin Urine: NEGATIVE
Glucose, UA: NEGATIVE mg/dL
Hgb urine dipstick: NEGATIVE
Ketones, ur: NEGATIVE mg/dL
Leukocytes,Ua: NEGATIVE
Nitrite: NEGATIVE
Protein, ur: NEGATIVE mg/dL
Specific Gravity, Urine: 1.006 (ref 1.005–1.030)
pH: 7 (ref 5.0–8.0)

## 2022-02-10 LAB — CBC WITH DIFFERENTIAL/PLATELET
Abs Immature Granulocytes: 4.34 10*3/uL — ABNORMAL HIGH (ref 0.00–0.07)
Basophils Absolute: 0.2 10*3/uL — ABNORMAL HIGH (ref 0.0–0.1)
Basophils Relative: 1 %
Eosinophils Absolute: 0.4 10*3/uL (ref 0.0–0.5)
Eosinophils Relative: 1 %
HCT: 33 % — ABNORMAL LOW (ref 39.0–52.0)
Hemoglobin: 10 g/dL — ABNORMAL LOW (ref 13.0–17.0)
Immature Granulocytes: 13 %
Lymphocytes Relative: 7 %
Lymphs Abs: 2.3 10*3/uL (ref 0.7–4.0)
MCH: 23.4 pg — ABNORMAL LOW (ref 26.0–34.0)
MCHC: 30.3 g/dL (ref 30.0–36.0)
MCV: 77.3 fL — ABNORMAL LOW (ref 80.0–100.0)
Monocytes Absolute: 6.6 10*3/uL — ABNORMAL HIGH (ref 0.1–1.0)
Monocytes Relative: 20 %
Neutro Abs: 19.2 10*3/uL — ABNORMAL HIGH (ref 1.7–7.7)
Neutrophils Relative %: 58 %
Platelets: 42 10*3/uL — ABNORMAL LOW (ref 150–400)
RBC: 4.27 MIL/uL (ref 4.22–5.81)
RDW: 24.1 % — ABNORMAL HIGH (ref 11.5–15.5)
WBC: 33.1 10*3/uL — ABNORMAL HIGH (ref 4.0–10.5)
nRBC: 0.1 % (ref 0.0–0.2)

## 2022-02-10 LAB — BRAIN NATRIURETIC PEPTIDE: B Natriuretic Peptide: 429.7 pg/mL — ABNORMAL HIGH (ref 0.0–100.0)

## 2022-02-10 LAB — CK: Total CK: 23 U/L — ABNORMAL LOW (ref 49–397)

## 2022-02-10 MED ORDER — POTASSIUM CHLORIDE CRYS ER 20 MEQ PO TBCR
40.0000 meq | EXTENDED_RELEASE_TABLET | Freq: Three times a day (TID) | ORAL | Status: AC
  Administered 2022-02-10 (×2): 40 meq via ORAL
  Filled 2022-02-10 (×2): qty 2

## 2022-02-10 MED ORDER — DEXAMETHASONE 2 MG PO TABS
2.0000 mg | ORAL_TABLET | Freq: Every day | ORAL | Status: DC
  Administered 2022-02-10 – 2022-02-12 (×3): 2 mg via ORAL
  Filled 2022-02-10 (×3): qty 1

## 2022-02-10 MED ORDER — ACETAMINOPHEN 325 MG PO TABS
650.0000 mg | ORAL_TABLET | Freq: Four times a day (QID) | ORAL | Status: DC | PRN
  Administered 2022-02-10: 650 mg via ORAL
  Filled 2022-02-10: qty 2

## 2022-02-10 MED ORDER — FAMOTIDINE 20 MG PO TABS
20.0000 mg | ORAL_TABLET | Freq: Every day | ORAL | Status: DC
  Administered 2022-02-10 – 2022-02-12 (×3): 20 mg via ORAL
  Filled 2022-02-10 (×3): qty 1

## 2022-02-10 MED ORDER — MIRABEGRON ER 25 MG PO TB24
25.0000 mg | ORAL_TABLET | Freq: Every day | ORAL | Status: DC
  Administered 2022-02-10 – 2022-02-11 (×2): 25 mg via ORAL
  Filled 2022-02-10 (×3): qty 1

## 2022-02-10 MED ORDER — GABAPENTIN 100 MG PO CAPS
200.0000 mg | ORAL_CAPSULE | Freq: Two times a day (BID) | ORAL | Status: DC
  Administered 2022-02-10 – 2022-02-12 (×4): 200 mg via ORAL
  Filled 2022-02-10 (×4): qty 2

## 2022-02-10 MED ORDER — FUROSEMIDE 10 MG/ML IJ SOLN
20.0000 mg | Freq: Two times a day (BID) | INTRAMUSCULAR | Status: DC
  Administered 2022-02-10 – 2022-02-11 (×3): 20 mg via INTRAVENOUS
  Filled 2022-02-10 (×2): qty 4

## 2022-02-10 MED ORDER — FUROSEMIDE 10 MG/ML IJ SOLN
40.0000 mg | Freq: Once | INTRAMUSCULAR | Status: DC
  Filled 2022-02-10: qty 4

## 2022-02-10 MED ORDER — SERTRALINE HCL 100 MG PO TABS
100.0000 mg | ORAL_TABLET | Freq: Every day | ORAL | Status: DC
  Administered 2022-02-10 – 2022-02-12 (×3): 100 mg via ORAL
  Filled 2022-02-10 (×2): qty 2
  Filled 2022-02-10: qty 1

## 2022-02-10 MED ORDER — ENSURE ENLIVE PO LIQD
237.0000 mL | Freq: Three times a day (TID) | ORAL | Status: DC
  Administered 2022-02-11 – 2022-02-12 (×3): 237 mL via ORAL
  Filled 2022-02-10 (×3): qty 237

## 2022-02-10 MED ORDER — ONDANSETRON HCL 4 MG PO TABS: 4.0000 mg | ORAL_TABLET | Freq: Four times a day (QID) | ORAL | Status: DC | PRN

## 2022-02-10 MED ORDER — ONDANSETRON HCL 4 MG/2ML IJ SOLN: 4.0000 mg | Freq: Four times a day (QID) | INTRAMUSCULAR | Status: DC | PRN

## 2022-02-10 MED ORDER — LORATADINE 10 MG PO TABS
10.0000 mg | ORAL_TABLET | Freq: Every day | ORAL | Status: DC
  Administered 2022-02-10 – 2022-02-12 (×3): 10 mg via ORAL
  Filled 2022-02-10 (×3): qty 1

## 2022-02-10 MED ORDER — TAMSULOSIN HCL 0.4 MG PO CAPS
0.8000 mg | ORAL_CAPSULE | Freq: Every day | ORAL | Status: DC
  Administered 2022-02-10 – 2022-02-12 (×3): 0.8 mg via ORAL
  Filled 2022-02-10 (×3): qty 2

## 2022-02-10 MED ORDER — AQUAPHOR EX OINT
1.0000 | TOPICAL_OINTMENT | Freq: Every day | CUTANEOUS | Status: DC
  Administered 2022-02-12: 1 via TOPICAL
  Filled 2022-02-10: qty 50

## 2022-02-10 MED ORDER — ACETAMINOPHEN 650 MG RE SUPP: 650.0000 mg | Freq: Four times a day (QID) | RECTAL | Status: DC | PRN

## 2022-02-10 MED ORDER — LORAZEPAM 1 MG PO TABS
1.0000 mg | ORAL_TABLET | Freq: Once | ORAL | Status: AC
  Administered 2022-02-10: 1 mg via ORAL
  Filled 2022-02-10: qty 1

## 2022-02-10 MED ORDER — POTASSIUM CHLORIDE 10 MEQ/100ML IV SOLN
10.0000 meq | Freq: Once | INTRAVENOUS | Status: AC
  Administered 2022-02-10: 10 meq via INTRAVENOUS
  Filled 2022-02-10: qty 100

## 2022-02-10 MED ORDER — FLUTICASONE PROPIONATE 50 MCG/ACT NA SUSP
1.0000 | Freq: Two times a day (BID) | NASAL | Status: DC
  Administered 2022-02-12: 1 via NASAL
  Filled 2022-02-10: qty 16

## 2022-02-10 MED ORDER — LORAZEPAM 0.5 MG PO TABS
0.5000 mg | ORAL_TABLET | Freq: Three times a day (TID) | ORAL | Status: DC | PRN
  Administered 2022-02-10 – 2022-02-11 (×2): 0.5 mg via ORAL
  Filled 2022-02-10 (×2): qty 1

## 2022-02-10 MED ORDER — ARIPIPRAZOLE 2 MG PO TABS
2.0000 mg | ORAL_TABLET | Freq: Every day | ORAL | Status: DC
  Administered 2022-02-10 – 2022-02-12 (×3): 2 mg via ORAL
  Filled 2022-02-10 (×3): qty 1

## 2022-02-10 NOTE — Telephone Encounter (Signed)
Tried to call [name] back but her voicemail is full so I was unable to leave a message.

## 2022-02-10 NOTE — ED Notes (Signed)
Pt's daughter back at bedside and provided an update.

## 2022-02-10 NOTE — ED Notes (Signed)
Unsuccessful IV attempt x2.  2nd RN asked to attempt.

## 2022-02-10 NOTE — ED Provider Notes (Signed)
[facility] AT [facility] Provider Note   CSN: [id number] Arrival date & time: 02/10/22  0559     History  Chief Complaint  Patient presents with   [name] is a 89 y.o. male.  Patient presents the emergency department secondary to an unwitnessed fall.  Patient reportedly hit his life alert at approximately 440 this morning due to falling outdoors.  The patient has a history of CML and is currently on hospice care.  He is reportedly not supposed to be leaving the house at night but apparently went outside to dispose of a soiled adult undergarment.  Patient arrives via EMS in a c-collar.  Unknown amount of downtime.  Patient's chief complaint is left shoulder pain.  Patient does have skin tears to right forearm.  No pain with palpation of pelvis, hips, lower extremities.  No obvious wounds noted to the head upon his arrival.  Patient is somewhat lethargic and slow to answer questions.  Patient's daughter states this is normal for him due to medications that he is on with hospice.  Patient currently not complaining of shortness of breath, chest pain, abdominal pain, nausea, vomiting, headache.  Past medical history includes hypertension, hyperlipidemia, thrombocytopenia, pernicious anemia, vitamin B deficiency, renal insufficiency, depression with suicide attempt in August of this past year, family diagnosed dementia.  He has a history of recent falls going back for the past few months.  HPI     Home Medications Prior to Admission medications   Medication Sig Start Date End Date Taking? Authorizing Provider  acetaminophen (TYLENOL) 500 MG tablet Take 500 mg by mouth every 6 (six) hours as needed.    [provider]  ARIPiprazole (ABILIFY) 2 MG tablet Take 2 mg by mouth daily. 12/02/21   [provider]  Artificial Tear Solution (TEARS NATURALE OP) Place 1-2 drops into both eyes at bedtime as needed (dry eyes).    [provider]  diclofenac Sodium (VOLTAREN ARTHRITIS PAIN) 1 % GEL Apply 2 g topically 3 (three) times daily as needed. Patient taking differently: Apply 2 g topically 3 (three) times daily as needed (left shoulder and arm pain). 08/18/21   Pleas Koch, NP  Ensure (ENSURE) Take 237 mLs by mouth daily. Chocolate    [provider]  famotidine (PEPCID) 20 MG tablet TAKE 1 TABLET BY MOUTH DAILY FOR ITCHING 01/29/22   Pleas Koch, NP  finasteride (PROSCAR) 5 MG tablet Take 1 tablet (5 mg total) by mouth daily. For urine flow 09/28/21   Pleas Koch, NP  fluticasone [facility]) 50 MCG/ACT nasal spray Place 1 spray into both nostrils 2 (two) times daily. 08/18/21   Pleas Koch, NP  gabapentin (NEURONTIN) 100 MG capsule Take 2 capsules (200 mg total) by mouth 2 (two) times daily. 09/23/21   Parks Ranger, DO  GEMTESA 75 MG TABS Take 1 tablet by mouth daily. 07/31/21   [provider]  loratadine (CLARITIN) 10 MG tablet Take 1 tablet (10 mg total) by mouth daily. For allergies 08/18/21   Pleas Koch, NP  OLANZapine (ZYPREXA) 5 MG tablet Take 1 tablet (5 mg total) by mouth at bedtime. 09/23/21   Parks Ranger, DO  predniSONE (DELTASONE) 20 MG tablet Take two tablets my mouth once daily in the morning for four days, then one tablet once daily in the morning for four days. Patient not taking: Reported on 01/07/2022 10/22/21   Alma Friendly  K, NP  senna-docusate (SENOKOT-S) 8.6-50 MG tablet Take 1 tablet by mouth at bedtime. 09/23/21   Parks Ranger, DO  sertraline (ZOLOFT) 100 MG tablet Take 1 tablet (100 mg total) by mouth daily. 09/24/21   Parks Ranger, DO  tamsulosin (FLOMAX) 0.4 MG CAPS capsule Take 2 capsules (0.8 mg total) by mouth daily. For urine flow 11/13/21   Pleas Koch, NP  triamcinolone cream (KENALOG) 0.5 % Apply 1 Application topically 2 (two) times daily. 10/02/21   Jinny Sanders, MD      Allergies     Penicillins and Tape    Review of Systems   Review of Systems  Constitutional:  Negative for fever.  Respiratory:  Negative for shortness of breath.   Cardiovascular:  Negative for chest pain.  Gastrointestinal:  Negative for abdominal pain.  Musculoskeletal:  Positive for arthralgias and neck pain.  Neurological:  Negative for speech difficulty.    Physical Exam Updated Vital Signs BP 114/67   Pulse 88   Temp 98.7 F (37.1 C) (Oral)   Resp (!) 32   SpO2 99%  Physical Exam Vitals and nursing note reviewed.  Constitutional:      General: He is not in acute distress.    Appearance: He is well-developed.  HENT:     Head: Normocephalic and atraumatic.  Eyes:     Extraocular Movements: Extraocular movements intact.     Conjunctiva/sclera: Conjunctivae normal.     Pupils: Pupils are equal, round, and reactive to light.  Cardiovascular:     Rate and Rhythm: Normal rate and regular rhythm.     Heart sounds: No murmur heard. Pulmonary:     Effort: Pulmonary effort is normal. No respiratory distress.     Breath sounds: Normal breath sounds.  Abdominal:     Palpations: Abdomen is soft.     Tenderness: There is no abdominal tenderness.  Musculoskeletal:        General: No swelling or deformity.     Cervical back: Neck supple.  Skin:    General: Skin is warm and dry.     Capillary Refill: Capillary refill takes less than 2 seconds.     Comments: Skin tears noted to right forearm  Neurological:     General: No focal deficit present.     Mental Status: He is alert.  Psychiatric:        Mood and Affect: Mood normal.     ED Results / Procedures / Treatments   Labs (all labs ordered are listed, but only abnormal results are displayed) Labs Reviewed  BASIC METABOLIC PANEL - Abnormal; Notable for the following components:      Result Value   Potassium 3.4 (*)    Calcium 8.4 (*)    All other components within normal limits  CBC WITH DIFFERENTIAL/PLATELET - Abnormal;  Notable for the following components:   WBC 33.1 (*)    Hemoglobin 10.0 (*)    HCT 33.0 (*)    MCV 77.3 (*)    MCH 23.4 (*)    RDW 24.1 (*)    Platelets 42 (*)    Neutro Abs 19.2 (*)    Monocytes Absolute 6.6 (*)    Basophils Absolute 0.2 (*)    Abs Immature Granulocytes 4.34 (*)    All other components within normal limits  CK - Abnormal; Notable for the following components:   Total CK 23 (*)    All other components within normal limits  BRAIN NATRIURETIC PEPTIDE -  Abnormal; Notable for the following components:   B Natriuretic Peptide 429.7 (*)    All other components within normal limits  URINALYSIS, ROUTINE W REFLEX MICROSCOPIC    EKG None  Radiology CT Head Wo Contrast  Result Date: 02/10/2022 CLINICAL DATA:  Head trauma, minor (Age >= 65y); Neck trauma (Age >= 65y) EXAM: CT HEAD WITHOUT CONTRAST CT CERVICAL SPINE WITHOUT CONTRAST TECHNIQUE: Multidetector CT imaging of the head and cervical spine was performed following the standard protocol without intravenous contrast. Multiplanar CT image reconstructions of the cervical spine were also generated. RADIATION DOSE REDUCTION: This exam was performed according to the departmental dose-optimization program which includes automated exposure control, adjustment of the mA and/or kV according to patient size and/or use of iterative reconstruction technique. COMPARISON:  07/03/2021, 01/03/2022 FINDINGS: CT HEAD FINDINGS Brain: No evidence of acute infarction, hemorrhage, hydrocephalus, extra-axial collection or mass lesion/mass effect. Patchy low-density changes within the periventricular and subcortical white matter compatible with chronic microvascular ischemic change. Mild-moderate diffuse cerebral volume loss. Vascular: No hyperdense vessel or unexpected calcification. Skull: Normal. Negative for fracture or focal lesion. Sinuses/Orbits: No acute finding. Other: None. CT CERVICAL SPINE FINDINGS Alignment: Facet joints are aligned  without dislocation or traumatic listhesis. Dens and lateral masses are aligned. Grade 1 anterolisthesis of C2 on C3, C3 on C4, C4 on C5 as well as grade 2 anterolisthesis of C7 on T1, unchanged. Anterolisthesis mediated by degenerative facet joint arthropathy. Skull base and vertebrae: No acute fracture. No primary bone lesion or focal pathologic process. Soft tissues and spinal canal: No prevertebral fluid or swelling. No visible canal hematoma. Disc levels: Severe multilevel cervical spondylosis, similar to prior. Upper chest: Layering bilateral pleural effusions. Ground-glass opacity within the visualized lung apices with interlobular septal thickening. Aortic atherosclerosis. Other: None. IMPRESSION: 1. No acute intracranial abnormality. 2. No acute fracture or traumatic listhesis of the cervical spine. 3. Layering bilateral pleural effusions with findings suggestive of pulmonary edema. Electronically Signed   By: Davina Poke D.O.   On: 02/10/2022 08:13   CT Cervical Spine Wo Contrast  Result Date: 02/10/2022 CLINICAL DATA:  Head trauma, minor (Age >= 65y); Neck trauma (Age >= 65y) EXAM: CT HEAD WITHOUT CONTRAST CT CERVICAL SPINE WITHOUT CONTRAST TECHNIQUE: Multidetector CT imaging of the head and cervical spine was performed following the standard protocol without intravenous contrast. Multiplanar CT image reconstructions of the cervical spine were also generated. RADIATION DOSE REDUCTION: This exam was performed according to the departmental dose-optimization program which includes automated exposure control, adjustment of the mA and/or kV according to patient size and/or use of iterative reconstruction technique. COMPARISON:  07/03/2021, 01/03/2022 FINDINGS: CT HEAD FINDINGS Brain: No evidence of acute infarction, hemorrhage, hydrocephalus, extra-axial collection or mass lesion/mass effect. Patchy low-density changes within the periventricular and subcortical white matter compatible with chronic  microvascular ischemic change. Mild-moderate diffuse cerebral volume loss. Vascular: No hyperdense vessel or unexpected calcification. Skull: Normal. Negative for fracture or focal lesion. Sinuses/Orbits: No acute finding. Other: None. CT CERVICAL SPINE FINDINGS Alignment: Facet joints are aligned without dislocation or traumatic listhesis. Dens and lateral masses are aligned. Grade 1 anterolisthesis of C2 on C3, C3 on C4, C4 on C5 as well as grade 2 anterolisthesis of C7 on T1, unchanged. Anterolisthesis mediated by degenerative facet joint arthropathy. Skull base and vertebrae: No acute fracture. No primary bone lesion or focal pathologic process. Soft tissues and spinal canal: No prevertebral fluid or swelling. No visible canal hematoma. Disc levels: Severe multilevel cervical spondylosis, similar to  prior. Upper chest: Layering bilateral pleural effusions. Ground-glass opacity within the visualized lung apices with interlobular septal thickening. Aortic atherosclerosis. Other: None. IMPRESSION: 1. No acute intracranial abnormality. 2. No acute fracture or traumatic listhesis of the cervical spine. 3. Layering bilateral pleural effusions with findings suggestive of pulmonary edema. Electronically Signed   By: Davina Poke D.O.   On: 02/10/2022 08:13   DG Pelvis 1-2 Views  Result Date: 02/10/2022 CLINICAL DATA:  89 year old male status post fall with pain. EXAM: PELVIS - 1-2 VIEW COMPARISON:  CT Abdomen and Pelvis 06/12/2021. FINDINGS: AP view at 0726 hours. Partially visible chronic right hip arthroplasty, right femoral implant. Normal AP alignment. Visible hardware appears intact. Underlying bone mineralization is normal for age. Left femoral head normally located. Grossly intact proximal left femur. No pelvis fracture identified. Chronic dextroconvex lumbar scoliosis with severe disc and endplate degeneration. Negative visible bowel gas pattern. IMPRESSION: No acute fracture or dislocation identified  about the pelvis. Chronic right hip arthroplasty. If there is lateralizing hip pain recommend dedicated hip series. Electronically Signed   By: Genevie Ann M.D.   On: 02/10/2022 07:50   DG Chest 2 View  Result Date: 02/10/2022 CLINICAL DATA:  88 year old male status post fall with pain. EXAM: CHEST - 2 VIEW COMPARISON:  Portable chest 09/04/2021 and earlier. FINDINGS: Semi upright AP and lateral views of the chest at 0721 hours. Similar lung volumes. Stable cardiac size and mediastinal contours. With some cardiomegaly. Visualized tracheal air column is within normal limits. Increased bilateral pulmonary interstitial opacity, somewhat coarse and asymmetric. No pneumothorax, pleural effusion or consolidation. Widespread degenerative osseous changes. No acute osseous abnormality identified. Paucity of bowel gas in the visible abdomen. Cholecystectomy clips. IMPRESSION: 1. Somewhat low lung volumes but disproportionate increased bilateral pulmonary interstitial opacity, nonspecific. Consider viral/atypical respiratory infection, interstitial edema. No pleural effusion or consolidation. 2. No acute traumatic injury identified. Electronically Signed   By: Genevie Ann M.D.   On: 02/10/2022 07:49   DG Forearm Right  Result Date: 02/10/2022 CLINICAL DATA:  Fall with right forearm and elbow lacerations. EXAM: RIGHT FOREARM - 2 VIEW COMPARISON:  None Available. FINDINGS: There is irregularity in the skin in the distal forearm most likely indicating skin lacerations. Vascular calcifications are noted in the ventral distal forearm. The bones demonstrate osteopenia without evidence of fractures. There is advanced degenerative arthrosis of the first [facility] joint, mild narrowing of the radiocarpal joint. IMPRESSION: 1. No evidence of fractures. 2. Osteopenia and degenerative changes. 3. Soft tissue lacerations in the distal forearm. Electronically Signed   By: Telford Nab M.D.   On: 02/10/2022 07:47   DG Elbow Complete  Right  Result Date: 02/10/2022 CLINICAL DATA:  89 year old male status post fall with pain. EXAM: RIGHT ELBOW - COMPLETE 3+ VIEW COMPARISON:  None Available. FINDINGS: Maintained alignment and joint spaces. Bone mineralization is within normal limits for age. Radial head appears intact. No joint effusion identified. No acute osseous abnormality identified. Generalized soft tissue swelling. IMPRESSION: Soft tissue swelling. No acute fracture or dislocation identified about the right elbow. Electronically Signed   By: Genevie Ann M.D.   On: 02/10/2022 07:46   DG Shoulder Left  Result Date: 02/10/2022 CLINICAL DATA:  89 year old male status post fall with pain. EXAM: LEFT SHOULDER - 2+ VIEW COMPARISON:  Chest radiographs 09/04/2021 and earlier. FINDINGS: AP and scapular Y-view demonstrates severe glenohumeral joint space loss with subchondral sclerosis and degenerative spurring. No glenohumeral joint dislocation. Proximal left humerus appears intact. No left clavicle  or scapula fracture identified. Grossly stable visible left chest, ribs. IMPRESSION: Severe glenohumeral joint degeneration. No acute fracture or dislocation identified about the left shoulder. Electronically Signed   By: Genevie Ann M.D.   On: 02/10/2022 07:45    Procedures Procedures    Medications Ordered in ED Medications  furosemide (LASIX) injection 40 mg (has no administration in time range)  potassium chloride 10 mEq in 100 mL IVPB (has no administration in time range)  LORazepam (ATIVAN) tablet 1 mg (1 mg Oral Given 02/10/22 1001)    ED Course/ Medical Decision Making/ A&P                             Medical Decision Making Amount and/or Complexity of Data Reviewed Labs: ordered. Radiology: ordered.  Risk Prescription drug management. Decision regarding hospitalization.   This patient presents to the ED for concern of injuries from a fall, this involves an extensive number of treatment options, and is a complaint that  carries with it a high risk of complications and morbidity.  The differential diagnosis includes fracture, dislocation, soft tissue injury, and others.  Patient also has had multiple recent falls.  Evaluation to include chest x-ray and basic labs   Co morbidities that complicate the patient evaluation  CML, hospice care   Additional history obtained:  Additional history obtained from patient's daughter, EMS External records from outside source obtained and reviewed including ED notes from December 17 with the patient was evaluated for a fall   Lab Tests:  I Ordered, and personally interpreted labs.  The pertinent results include: BNP 429.7, WBC 33.1, hemoglobin 10.0, potassium 3.4   Imaging Studies ordered:  I ordered imaging studies including the head and cervical spine, plain films of the pelvis, chest, right forearm, right elbow, and left shoulder I independently visualized and interpreted imaging which showed no acute fractures noted.  Severe arthritis noted in the left shoulder.  Chest x-ray shows   Somewhat low lung volumes but disproportionate increased  bilateral pulmonary interstitial opacity, nonspecific  CT scans that show 1. No acute intracranial abnormality.  2. No acute fracture or traumatic listhesis of the cervical spine.  3. Layering bilateral pleural effusions with findings suggestive of  pulmonary edema.   I agree with the radiologist interpretation   Cardiac Monitoring: / EKG:  The patient was maintained on a cardiac monitor.  I personally viewed and interpreted the cardiac monitored which showed an underlying rhythm of: Sinus rhythm   Consultations Obtained:  I requested consultation with the hospitalist, Dr.Ortiz and discussed lab and imaging findings as well as pertinent plan - they recommend: admission   Problem List / ED Course / Critical interventions / Medication management   I ordered medication including oxygen for hypoxia, Lasix for fluid  overload, Ativan for agitation/restlessness from underlying dementia Reevaluation of the patient after these medicines showed that the patient improved I have reviewed the patients home medicines and have made adjustments as needed   Social Determinants of Health:  Patient currently on hospice care lives at home with her granddaughter   Test / Admission - Considered:  Patient has a new oxygen requirement.  No oxygen available at home.  Patient needs admission for diuresis and oxygen therapy due to what appears to be fluid overload.  No chest pain.  No cough to suggest pneumonia.  WBC has been elevated for the past 3 months.  Doubt pneumonia at this time.  Final Clinical Impression(s) / ED Diagnoses Final diagnoses:  Fall, initial encounter  Elevated brain natriuretic peptide (BNP) level  Skin tear of forearm without complication, right, initial encounter  On supplemental oxygen by nasal cannula    Rx / DC Orders ED Discharge Orders     None         Ronny Bacon 02/10/22 1139    Ezequiel Essex, MD 02/10/22 1712

## 2022-02-10 NOTE — Telephone Encounter (Signed)
Patient daughter [name] called back in returning call she received. Phone wouldn't allow her to answer but she stated that you can feel free to call her back anytime.

## 2022-02-10 NOTE — Telephone Encounter (Signed)
From [facility] in Billing: Office visit will depend  [phone]  [phone]  It would also depend if he uses US guidance or not  20610 without guidance is 206.75  20611 is with US guidance=315.00  Triamcinolone '40mg'$ =24.00

## 2022-02-10 NOTE — ED Triage Notes (Signed)
BIB EMS/ found by family outside on back porch after fall/ pt called [facility] after fall/ pt was outside for approx. 1 hour/ injury to left shoulder/ right elbow/ skin tear to right forearm/ denies hitting head, no obvious injury to head/ pt is drowsy but able to answer questions appropriately/ heat blanket applied by EMS.

## 2022-02-10 NOTE — Telephone Encounter (Signed)
Spoke with [name] and advised them to reach out the their hospice social worker to address having shoulder injection done at our office.  I am still waiting on a cost from [place] but I told [name] I could call her back once I had that information.   [name] states her dad is currently in the ER waiting to be admitted.  He fell this morning.

## 2022-02-10 NOTE — ED Notes (Signed)
Pts bed linen, pad, brief, gown changed/ pericare performed/ new male purwick applied/ pt resting comfortably.

## 2022-02-10 NOTE — ED Notes (Signed)
Pt found to have pulled off all monitoring equipment and gown, bed covered in urine, and Pt has put legs through bed rails.  Pt cleaned and linens changed by ED staff.  Mitts placed to keep Pt from removing monitoring equipment and gown again.

## 2022-02-10 NOTE — H&P (Signed)
History and Physical    Patient: [name] HQR:[id number] DOB: 30-May-1933 DOA: 02/10/2022 DOS: the patient was seen and examined on 02/10/2022 PCP: Patient, No Pcp Per  Patient coming from: Home  Chief Complaint:  Chief Complaint  Patient presents with   Fall   HPI: [name] is a 89 y.o. male with medical history significant of osteoarthritis, BPH, chronic dysuria, elevated PSA, history of prostatitis, chronic idiopathic monocytosis, CAD, cervical and lumbar DDD, diverticulosis, hiatal hernia, colon polyps, history of remote episode of atrial fibrillation, COVID-19, history of pancreatitis, hyperlipidemia, hypertension, nephrolithiasis, OSA not on CPAP due to intolerance, pernicious anemia due to B12 deficiency, Peyronie's disease, renal insufficiency, sepsis, thrombocytopenia who called his life alert service after he had a fall at home sustaining injuries to his left shoulder, right elbow with a skin tear in his right forearm after he went outside to dispose of soiled undergarments.  He is currently sedated and unable to provide any history.  ED course: Initial vital signs were temperature 97.7 F, pulse 85, respirations 16, BP 134/56 mmHg O2 sat 87% on room air.  The patient received furosemide 20 mg IVP, KCl 10 mEq IVPB x 1 and lorazepam 1 mg p.o. x 1.  Lab work: His urinalysis was normal.  CBC showed a white count of 33.1 with 58% neutrophils, 7% lymphocytes and 20% monocytes.  BMP with a potassium of 3.4 mmol/L and calcium of 8.4 mg/dL.  The rest of the measurements were normal.  Total CK20 3 units/L.  BNP 430 pg/mL.  Imaging: Left shoulder, right elbow, right forearm, pelvics x-ray, CT head and CT cervical spine with multiple DJD findings, but no fractures or any other acute issues.  2 view chest radiograph with low lung volumes with mild cardiomegaly and disproportionate increase in bilateral pulmonary interstitial opacities.  Consider viral/atypical respiratory infection or  interstitial edema.   Review of Systems: As mentioned in the history of present illness. All other systems reviewed and are negative. Past Medical History:  Diagnosis Date   Arthralgia of multiple joints    limited mobility w/  independant adl's   BPH with obstruction/lower urinary tract symptoms    Chronic idiopathic monocytosis    followed by dr Waymon Budge--  per lov note 06/ 2017 persistant unexplained  with normal bone barrow bx   Coronary atherosclerosis of unspecified type of vessel, native or graft    cardiologist-  dr Stanford Breed -- per lov note 11-19-2014 ,  cardiac cath in 2000-- pLAD 60-70%,  small intermediate branch 70-80%,  mRCA 20%,  normal LV   Degenerative arthritis of spine    cervical and lumar   Diverticulosis of colon (without mention of hemorrhage)    Dysuria    chronic   Elevated PSA    Feeling of incomplete bladder emptying    Hiatal hernia    moderate per ct 11/ 2017   History of adenomatous polyp of colon    2008   History of atrial fibrillation    remote hx episode   History of COVID-19 03/05/2020   History of pancreatitis    07-01-2013  and 10-10-2015   History of prostatitis    2014   Hx of colonic polyps    Hyperlipidemia    Hypertension    Mouth sore    roof of mouth sore    Nephrolithiasis    Nocturia more than twice per night    severe  w/ leakage   OSA (obstructive sleep apnea)  INTOLERANT CPAP   Osteoarthritis    Pernicious anemia    B12  Def.   Peyronie disease    Renal insufficiency    Sepsis ([place]) 03/22/2017   Thrombocytopenia, unspecified ([place]) hemotology/oncologist-  dr Waymon Budge   per dr Velta Addison note 06/ 2016  secondary to vitro clumping   Vitamin B deficiency    Past Surgical History:  Procedure Laterality Date   BONE MARROW BIOPSY  2011   normal   CARDIAC CATHETERIZATION  2000   per dr Kathyrn Drown note --  dLAD 60-70%,  small intermediate branch 70-80%,  mRCA 20%,  normal LV   CARDIOVASCULAR STRESS TEST   01-29-2013   dr Stanford Breed   normal nuclear study w/ no ischemia,  normal LV function and wall motion , ef 82%   CATARACT EXTRACTION W/ INTRAOCULAR LENS  IMPLANT, BILATERAL  08/2015   CHOLECYSTECTOMY  11/24/2010   Procedure: LAPAROSCOPIC CHOLECYSTECTOMY WITH INTRAOPERATIVE CHOLANGIOGRAM;  Surgeon: Earnstine Regal, MD;  Location: [facility];  Service: General;  Laterality: N/A;  c-arm   CYSTOSCOPY W/ URETERAL STENT PLACEMENT Left 12/17/2015   Procedure: CYSTOSCOPY WITH RETROGRADE PYELOGRAM/URETERAL STENT PLACEMENT;  Surgeon: Ardis Hughs, MD;  Location: [facility];  Service: Urology;  Laterality: Left;   CYSTOSCOPY WITH RETROGRADE PYELOGRAM, URETEROSCOPY AND STENT PLACEMENT Left 12/22/2015   Procedure: CYSTOSCOPY WITH LEFT RETROGRADE  URETEROSCOPY AND STENT PLACEMENT;  Surgeon: Carolan Clines, MD;  Location: [facility];  Service: Urology;  Laterality: Left;   ERCP N/A 07/04/2013   Procedure: ENDOSCOPIC RETROGRADE CHOLANGIOPANCREATOGRAPHY (ERCP);  Surgeon: Gatha Mayer, MD;  Location: [facility];  Service: Endoscopy;  Laterality: N/A;  MAC if available   ERCP N/A 10/12/2015   Procedure: ENDOSCOPIC RETROGRADE CHOLANGIOPANCREATOGRAPHY (ERCP);  Surgeon: [name], MD;  Location: [facility];  Service: Endoscopy;  Laterality: N/A;   HOLMIUM LASER APPLICATION Left 77/01/1655   Procedure: HOLMIUM LASER APPLICATION;  Surgeon: Carolan Clines, MD;  Location: [facility];  Service: Urology;  Laterality: Left;   INGUINAL HERNIA REPAIR Right 01/25/2002   KNEE ARTHROSCOPY  x5   SATURATION BIOPSY OF PROSTATE  05-29-2007  and 01-26-2008   SHOULDER ARTHROSCOPY WITH OPEN ROTATOR CUFF REPAIR AND DISTAL CLAVICLE ACROMINECTOMY Right 11/19/2004   TOTAL HIP ARTHROPLASTY Right 05/21/2009   TOTAL KNEE ARTHROPLASTY Bilateral left 05-02-2003/  right  03-23-2010   TRANSTHORACIC ECHOCARDIOGRAM  12/23/2004   ef 60%, mild MV calcification without stenosis/  mild TR,  PASP 54mHg    UVULOPALATOPHARYNGOPLASTY  1993    w/  T & A   Social History:  reports that he quit smoking about 56 years ago. His smoking use included pipe. He has never used smokeless tobacco. He reports that he does not drink alcohol and does not use drugs.  Allergies  Allergen Reactions   Penicillins Hives and Other (See Comments)    [place]ps, passed out Tolerates cephalosporins  Has patient had a PCN reaction causing immediate rash, facial/tongue/throat swelling, SOB or lightheadedness with hypotension:  yes Has patient had a PCN reaction causing severe rash involving mucus membranes or skin necrosis: no Has patient had a PCN reaction that required hospitalization: no Has patient had a PCN reaction occurring within the last 10 years: no If all of the above answers are "NO", then may proceed with Cephalosporin use.    Tape Hives and Other (See Comments)    Paper Tape    Family History  Problem Relation Age of Onset   Peripheral vascular disease Mother  Colon cancer Father 76   Breast cancer Daughter    Breast cancer Daughter     Prior to Admission medications   Medication Sig Start Date End Date Taking? Authorizing Provider  acetaminophen (TYLENOL) 500 MG tablet Take 500 mg by mouth every 6 (six) hours as needed for mild pain.   Yes [provider]  ARIPiprazole (ABILIFY) 2 MG tablet Take 2 mg by mouth daily. 12/02/21  Yes [provider]  Artificial Tear Solution (TEARS NATURALE OP) Place 1-2 drops into both eyes at bedtime as needed (dry eyes).   Yes [provider]  dexamethasone (DECADRON) 2 MG tablet Take 2 mg by mouth daily. 01/31/22  Yes [provider]  diclofenac Sodium (VOLTAREN ARTHRITIS PAIN) 1 % GEL Apply 2 g topically 3 (three) times daily as needed. Patient taking differently: Apply 2 g topically 3 (three) times daily as needed (left shoulder and arm pain). 08/18/21  Yes Pleas Koch, NP  Ensure (ENSURE) Take 237 mLs by mouth 3 (three)  times daily between meals. Chocolate   Yes [provider]  famotidine (PEPCID) 20 MG tablet TAKE 1 TABLET BY MOUTH DAILY FOR ITCHING Patient taking differently: Take 20 mg by mouth daily. 01/29/22  Yes Pleas Koch, NP  fluticasone (FLONASE) 50 MCG/ACT nasal spray Place 1 spray into both nostrils 2 (two) times daily. 08/18/21  Yes Pleas Koch, NP  gabapentin (NEURONTIN) 100 MG capsule Take 2 capsules (200 mg total) by mouth 2 (two) times daily. 09/23/21  Yes Parks Ranger, DO  GEMTESA 75 MG TABS Take 75 mg by mouth at bedtime. 07/31/21  Yes [provider]  hydrOXYzine (ATARAX) 25 MG tablet Take 25 mg by mouth at bedtime. 01/25/22  Yes [provider]  LASIX 20 MG tablet Take 20 mg by mouth daily. X 3 days 02/08/22  Yes [provider]  loratadine (CLARITIN) 10 MG tablet Take 1 tablet (10 mg total) by mouth daily. For allergies 08/18/21  Yes Pleas Koch, NP  LORazepam (ATIVAN) 0.5 MG tablet Take 0.5 mg by mouth every 8 (eight) hours as needed for anxiety. 02/05/22  Yes [provider]  mineral oil-hydrophilic petrolatum (AQUAPHOR) ointment Apply 1 Application topically daily.   Yes [provider]  sertraline (ZOLOFT) 100 MG tablet Take 1 tablet (100 mg total) by mouth daily. 09/24/21  Yes Parks Ranger, DO  tamsulosin (FLOMAX) 0.4 MG CAPS capsule Take 2 capsules (0.8 mg total) by mouth daily. For urine flow 11/13/21  Yes Pleas Koch, NP  triamcinolone cream (KENALOG) 0.5 % Apply 1 Application topically 2 (two) times daily. 10/02/21  Yes Jinny Sanders, MD    Physical Exam: Vitals:   02/10/22 1135 02/10/22 1234 02/10/22 1300 02/10/22 1400  BP: (!) 111/54  116/87 120/82  Pulse: 87  88 90  Resp: (!) 23  (!) 22 20  Temp:  (!) 97.3 F (36.3 C)    TempSrc:  Oral    SpO2: 92%  91% 92%   Physical Exam Vitals and nursing note reviewed.  Constitutional:      General: He is sleeping. He is not in acute  distress.    Appearance: He is not ill-appearing.  HENT:     Head: Normocephalic.     Nose: No rhinorrhea.     Mouth/Throat:     Mouth: Mucous membranes are dry.  Eyes:     General: No scleral icterus.    Pupils: Pupils are equal, round, and  reactive to light.  Neck:     Vascular: No JVD.  Cardiovascular:     Rate and Rhythm: Normal rate and regular rhythm.     Heart sounds: S1 normal and S2 normal.  Pulmonary:     Effort: Pulmonary effort is normal.     Breath sounds: Normal breath sounds. No wheezing, rhonchi or rales.  Musculoskeletal:     Cervical back: Neck supple.     Right lower leg: No edema.     Left lower leg: No edema.     Comments: Hand mitts on.  Skin:    General: Skin is warm and dry.     Comments: Multiple small wounds on extremities.  Dressing on larger wound on right forearm is clear with no bleeding or discharge.  Neurological:     Mental Status: He is easily aroused.     Comments: Sedated after lorazepam administered.  Unable to fully evaluate.   Data Reviewed:  Results are pending, will review when available.  No previous echocardiogram.  Assessment and Plan: Principal Problem:   Acute CHF (congestive heart failure) (HCC) No previous echocardiogram. Observation/telemetry. Supplemental oxygen as needed. Sodium and fluid restriction. Continue furosemide 20 mg IVP twice daily. Monitor daily weights, intake and output. Monitor renal function electrolytes. Check echocardiogram.  Active Problems:   HLD (hyperlipidemia) Currently not on medical therapy.    Essential hypertension On diuretics at this time. Monitor blood pressure, GFR and electrolytes.    Coronary atherosclerosis Currently not on medical therapy. Begin low-dose aspirin. Check fasting lipids. Follow-up with PCP and/or cardiology.    BPH (benign prostatic hyperplasia) Continue tamsulosin 0.8 mg p.o. daily.    GERD (gastroesophageal reflux disease) Continue famotidine 20 mg  p.o. daily.    Thrombocytopenia (HCC) Monitor platelet count.    CML (chronic myelocytic leukemia) (HCC) On dexamethasone 2 mg p.o. daily. Monitor CBC. Follow-up with hematology as an outpatient.    Chronic anxiety/Depression Continue Abilify 2 mg p.o. daily. Continue lorazepam 0.5 mg every 8 hours as needed. Continue sertraline 100 mg p.o. daily.     Advance Care Planning:   Code Status: DNR   Consults:   Family Communication:   Severity of Illness: The appropriate patient status for this patient is OBSERVATION. Observation status is judged to be reasonable and necessary in order to provide the required intensity of service to ensure the patient's safety. The patient's presenting symptoms, physical exam findings, and initial radiographic and laboratory data in the context of their medical condition is felt to place them at decreased risk for further clinical deterioration. Furthermore, it is anticipated that the patient will be medically stable for discharge from the hospital within 2 midnights of admission.   Author: Reubin Milan, MD 02/10/2022 2:40 PM  For on call review [email or web address].   This document was prepared using Dragon voice recognition software and may contain some unintended transcription errors.

## 2022-02-10 NOTE — ED Notes (Signed)
Pt found out in the hallway stating he is ready to leave.  Pt redirected into room.  Pt requesting to use the restroom.  Pt's diaper filled with urine and stool.  Pt cleaned and changed into a gown by ED staff.  Bed alarm turned on.  Lights turned off for comfort and to decrease stimuli.

## 2022-02-11 ENCOUNTER — Other Ambulatory Visit (HOSPITAL_COMMUNITY): Payer: Medicare HMO

## 2022-02-11 DIAGNOSIS — Z8616 Personal history of COVID-19: Secondary | ICD-10-CM | POA: Diagnosis not present

## 2022-02-11 DIAGNOSIS — S51811A Laceration without foreign body of right forearm, initial encounter: Secondary | ICD-10-CM | POA: Diagnosis present

## 2022-02-11 DIAGNOSIS — Y92009 Unspecified place in unspecified non-institutional (private) residence as the place of occurrence of the external cause: Secondary | ICD-10-CM | POA: Diagnosis not present

## 2022-02-11 DIAGNOSIS — F0393 Unspecified dementia, unspecified severity, with mood disturbance: Secondary | ICD-10-CM | POA: Diagnosis present

## 2022-02-11 DIAGNOSIS — K219 Gastro-esophageal reflux disease without esophagitis: Secondary | ICD-10-CM | POA: Diagnosis present

## 2022-02-11 DIAGNOSIS — G4733 Obstructive sleep apnea (adult) (pediatric): Secondary | ICD-10-CM | POA: Diagnosis present

## 2022-02-11 DIAGNOSIS — E785 Hyperlipidemia, unspecified: Secondary | ICD-10-CM | POA: Diagnosis present

## 2022-02-11 DIAGNOSIS — F32A Depression, unspecified: Secondary | ICD-10-CM | POA: Diagnosis present

## 2022-02-11 DIAGNOSIS — I5031 Acute diastolic (congestive) heart failure: Secondary | ICD-10-CM | POA: Diagnosis present

## 2022-02-11 DIAGNOSIS — G9341 Metabolic encephalopathy: Secondary | ICD-10-CM | POA: Diagnosis present

## 2022-02-11 DIAGNOSIS — I2510 Atherosclerotic heart disease of native coronary artery without angina pectoris: Secondary | ICD-10-CM | POA: Diagnosis present

## 2022-02-11 DIAGNOSIS — I509 Heart failure, unspecified: Secondary | ICD-10-CM | POA: Diagnosis present

## 2022-02-11 DIAGNOSIS — L89151 Pressure ulcer of sacral region, stage 1: Secondary | ICD-10-CM | POA: Diagnosis present

## 2022-02-11 DIAGNOSIS — I4891 Unspecified atrial fibrillation: Secondary | ICD-10-CM | POA: Diagnosis present

## 2022-02-11 DIAGNOSIS — Z515 Encounter for palliative care: Secondary | ICD-10-CM | POA: Diagnosis not present

## 2022-02-11 DIAGNOSIS — I110 Hypertensive heart disease with heart failure: Secondary | ICD-10-CM | POA: Diagnosis present

## 2022-02-11 DIAGNOSIS — E876 Hypokalemia: Secondary | ICD-10-CM | POA: Diagnosis present

## 2022-02-11 DIAGNOSIS — R5381 Other malaise: Secondary | ICD-10-CM | POA: Diagnosis present

## 2022-02-11 DIAGNOSIS — W19XXXA Unspecified fall, initial encounter: Secondary | ICD-10-CM | POA: Diagnosis present

## 2022-02-11 DIAGNOSIS — Z79899 Other long term (current) drug therapy: Secondary | ICD-10-CM | POA: Diagnosis not present

## 2022-02-11 DIAGNOSIS — N401 Enlarged prostate with lower urinary tract symptoms: Secondary | ICD-10-CM | POA: Diagnosis present

## 2022-02-11 DIAGNOSIS — D696 Thrombocytopenia, unspecified: Secondary | ICD-10-CM | POA: Diagnosis present

## 2022-02-11 DIAGNOSIS — F0394 Unspecified dementia, unspecified severity, with anxiety: Secondary | ICD-10-CM | POA: Diagnosis present

## 2022-02-11 DIAGNOSIS — D509 Iron deficiency anemia, unspecified: Secondary | ICD-10-CM | POA: Diagnosis present

## 2022-02-11 DIAGNOSIS — C9210 Chronic myeloid leukemia, BCR/ABL-positive, not having achieved remission: Secondary | ICD-10-CM | POA: Diagnosis present

## 2022-02-11 DIAGNOSIS — Z87891 Personal history of nicotine dependence: Secondary | ICD-10-CM | POA: Diagnosis not present

## 2022-02-11 DIAGNOSIS — Z66 Do not resuscitate: Secondary | ICD-10-CM | POA: Diagnosis present

## 2022-02-11 LAB — COMPREHENSIVE METABOLIC PANEL
ALT: 16 U/L (ref 0–44)
AST: 21 U/L (ref 15–41)
Albumin: 3.4 g/dL — ABNORMAL LOW (ref 3.5–5.0)
Alkaline Phosphatase: 80 U/L (ref 38–126)
Anion gap: 11 (ref 5–15)
BUN: 14 mg/dL (ref 8–23)
CO2: 23 mmol/L (ref 22–32)
Calcium: 8.6 mg/dL — ABNORMAL LOW (ref 8.9–10.3)
Chloride: 106 mmol/L (ref 98–111)
Creatinine, Ser: 1.09 mg/dL (ref 0.61–1.24)
GFR, Estimated: >60 mL/min (ref >60)
Glucose, Bld: 103 mg/dL — ABNORMAL HIGH (ref 70–99)
Potassium: 4.5 mmol/L (ref 3.5–5.1)
Sodium: 140 mmol/L (ref 135–145)
Total Bilirubin: 1.2 mg/dL (ref 0.3–1.2)
Total Protein: 5.2 g/dL — ABNORMAL LOW (ref 6.5–8.1)

## 2022-02-11 LAB — CBC
HCT: 35.5 % — ABNORMAL LOW (ref 39.0–52.0)
Hemoglobin: 10.6 g/dL — ABNORMAL LOW (ref 13.0–17.0)
MCH: 23.4 pg — ABNORMAL LOW (ref 26.0–34.0)
MCHC: 29.9 g/dL — ABNORMAL LOW (ref 30.0–36.0)
MCV: 78.4 fL — ABNORMAL LOW (ref 80.0–100.0)
Platelets: 49 10*3/uL — ABNORMAL LOW (ref 150–400)
RBC: 4.53 MIL/uL (ref 4.22–5.81)
RDW: 24.1 % — ABNORMAL HIGH (ref 11.5–15.5)
WBC: 30.2 10*3/uL — ABNORMAL HIGH (ref 4.0–10.5)
nRBC: 0 % (ref 0.0–0.2)

## 2022-02-11 LAB — PROCALCITONIN: Procalcitonin: <0.1 ng/mL

## 2022-02-11 LAB — TSH: TSH: 1.227 u[IU]/mL (ref 0.350–4.500)

## 2022-02-11 LAB — VITAMIN B12: Vitamin B-12: 637 pg/mL (ref 180–914)

## 2022-02-11 MED ORDER — LORAZEPAM 2 MG/ML IJ SOLN
0.5000 mg | Freq: Four times a day (QID) | INTRAMUSCULAR | Status: DC | PRN
  Administered 2022-02-11 – 2022-02-12 (×3): 0.5 mg via INTRAVENOUS
  Filled 2022-02-11 (×3): qty 1

## 2022-02-11 MED ORDER — FUROSEMIDE 10 MG/ML IJ SOLN
20.0000 mg | Freq: Every day | INTRAMUSCULAR | Status: DC
  Administered 2022-02-12: 20 mg via INTRAVENOUS
  Filled 2022-02-11: qty 2

## 2022-02-11 NOTE — Progress Notes (Signed)
Heart Failure Navigator Progress Note  Assessed for Heart & Vascular TOC clinic readiness.  Patient does not meet criteria due to hospice service outpatient.   Navigator will sign off at this time. Earnestine Leys, BSN, RN Heart Failure Transport planner Only

## 2022-02-11 NOTE — Hospital Course (Addendum)
89 year old male with multiple medical history of osteoarthritis, BPH, chronic dysuria with elevated PSA/history of prostatitis, chronic idiopathic monocytosis, CAD, cervical and lumbar DJD, diverticulosis, hiatal hernia, colonic polyps history of remote episodes of A-fib, COVID-19 infection, history of pancreatitis, hyperlipidemia hypertension nephrolithiasis OSA not on CPAP due to intolerance, pernicious anemia due to B12 deficiency Peyronie's disease renal insufficiency presented to the ED after episode of fall outside office home.  In the ED vitals are stable, labs showed significant leukocytosis 33.1 chronic microcytic anemia thrombocytopenia 42 mild hypokalemia normal CK, extensive imaging negative for fractures, but positive for CHF along with elevated BNP.  Chest x-ray low lung volumes but increased bilateral pulmonary interstitial opacity. Patient was admitted for CHF exacerbation.  He has had decline at this current impairment/dementia, he is being followed by hospice at home. TTE shoed normal lvef 55-60%, No RWMA, diastolic parameters indeterminate.

## 2022-02-11 NOTE — Progress Notes (Signed)
PROGRESS NOTE [name]  GGE:[id number] DOB: 12-08-1933 DOA: 02/10/2022 PCP: Patient, No Pcp Per   Brief Narrative/Hospital Course: 89 year old male with multiple medical history of osteoarthritis, BPH, chronic dysuria with elevated PSA/history of prostatitis, chronic idiopathic monocytosis, CAD, cervical and lumbar DJD, diverticulosis, hiatal hernia, colonic polyps history of remote episodes of A-fib, COVID-19 infection, history of pancreatitis, hyperlipidemia hypertension nephrolithiasis OSA not on CPAP due to intolerance, pernicious anemia due to B12 deficiency Peyronie's disease renal insufficiency presented to the ED after episode of fall outside office home.  In the ED vitals are stable, labs showed significant leukocytosis 33.1 chronic microcytic anemia thrombocytopenia 42 mild hypokalemia normal CK, extensive imaging negative for fractures, but positive for CHF along with elevated BNP.  Chest x-ray low lung volumes but increased bilateral pulmonary interstitial opacity. Patient was admitted for CHF exacerbation.    Subjective: Patient seen and examined this morning in the ED He remains confused about location, is alert awake oriented to self  Overnight patient has been afebrile BP stable in 90s to low 100, saturating on 92% Labs reviewed. Mittens in place as he was removing his monitor and IV line overnight  Assessment and Plan: Principal Problem:   Acute CHF (congestive heart failure) (HCC) Active Problems:   HLD (hyperlipidemia)   Essential hypertension   Coronary atherosclerosis   Thrombocytopenia (HCC)   BPH (benign prostatic hyperplasia)   Depression   GERD (gastroesophageal reflux disease)   Chronic anxiety   CML (chronic myelocytic leukemia) (HCC)   Concern for acute congestive heart failure elevated BNP chest x-ray abnormal.  Follow-up echocardiogram continue Lasix 20 mg, continue to monitor daily weight, intake output, renal function.  Check troponin for  completeness  Deconditioning/debility /fall at home: No evidence of acute fractures, obtain PT OT evaluation, check vitamin D level B12 TSH.  CML with Leukocytosis Chronic thrombocytopenia Chronic microcytic anemia: UA unremarkable, chest x-ray without overt pneumonia.  No evidence of infection.  On a steroid daily.  Follows with Dr. Lorenso Courier from oncology for [name] has had recent decline, currently at home with hospice> planning to return home with hospice  Essential hypertension: BP stable Coronary atherosclerosis not on meds started low-dose aspirin checking fasting lipid profile will need outpatient PCP/cardiology follow-up GERD on Pepcid  Suspect acute metabolic encephalopathy in the setting of background dementia. Early stage of dementia currently confused:  Needing mittens in place.  PT OT eval supportive care Goals of care: Currently DNR, lives with family at home and has home hospice  DVT prophylaxis: SCDs Start: 02/10/22 1138 Code Status:   Code Status: DNR Family Communication: plan of care discussed with patient/updated patient's daughter over the phone Patient status is: admitted as observation but remains hospitalized for ongoing evaluation for fall, CHF.  Level of care: Telemetry  Dispo: The patient is from: Home with hospice with family            Anticipated disposition: Return back to previous state Objective: Vitals last 24 hrs: Vitals:   02/11/22 0715 02/11/22 0717 02/11/22 0730 02/11/22 0830  BP:    122/62  Pulse: 91  92 91  Resp: '17  19 17  '$ Temp:  (!) 97.5 F (36.4 C)    TempSrc:  Oral    SpO2: 94%  92% 93%  Weight:      Height:       Weight change:   Physical Examination: General exam: alert awake oriented x 1-2, older than stated age HEENT:Oral mucosa moist, Ear/Nose WNL grossly Respiratory system:  bilaterally clear BS, no use of accessory muscle Cardiovascular system: S1 & S2 +, No JVD. Gastrointestinal system: Abdomen soft,NT,ND, BS+ Nervous  System:Alert, awake, moving extremities. Extremities: LE edema neg,distal peripheral pulses palpable.  Skin: No rashes,no icterus. MSK: Normal muscle bulk,tone, power  Medications reviewed:  Scheduled Meds:  ARIPiprazole  2 mg Oral Daily   dexamethasone  2 mg Oral Daily   famotidine  20 mg Oral Daily   feeding supplement  237 mL Oral TID BM   fluticasone  1 spray Each Nare BID   furosemide  20 mg Intravenous BID   gabapentin  200 mg Oral BID   loratadine  10 mg Oral Daily   mineral oil-hydrophilic petrolatum  1 Application Topical Daily   mirabegron ER  25 mg Oral QHS   sertraline  100 mg Oral Daily   tamsulosin  0.8 mg Oral Daily   Continuous Infusions:  Unresulted Labs (From admission, onward)     Start     Ordered   02/12/22 2841  Basic metabolic panel  Daily,   R      02/10/22 1141   02/11/22 0915  TSH  Once,   R        02/11/22 0915   02/11/22 0837  Vitamin B12  Add-on,   AD        02/11/22 0836   02/11/22 0837  VITAMIN D 25 Hydroxy (Vit-D Deficiency, Fractures)  Add-on,   AD        02/11/22 0836   02/11/22 0500  CBC  Daily,   R      02/10/22 1141          Data Reviewed: I have personally reviewed following labs and imaging studies CBC: Recent Labs  Lab 02/10/22 0900 02/11/22 0519  WBC 33.1* 30.2*  NEUTROABS 19.2*  --   HGB 10.0* 10.6*  HCT 33.0* 35.5*  MCV 77.3* 78.4*  PLT 42* 49*   Basic Metabolic Panel: Recent Labs  Lab 02/10/22 0900 02/11/22 0519  NA 140 140  K 3.4* 4.5  CL 107 106  CO2 24 23  GLUCOSE 95 103*  BUN 16 14  CREATININE 0.99 1.09  CALCIUM 8.4* 8.6*   GFR: Estimated Creatinine Clearance: 37.4 mL/min (by C-G formula based on SCr of 1.09 mg/dL). Liver Function Tests: Recent Labs  Lab 02/11/22 0519  AST 21  ALT 16  ALKPHOS 80  BILITOT 1.2  PROT 5.2*  ALBUMIN 3.4*   Recent Labs  Lab 02/11/22 0915  PROCALCITON <0.10  No results found for this or any previous visit (from the past 240 hour(s)).   Antimicrobials: Anti-infectives (From admission, onward)    None      Culture/Microbiology    Component Value Date/Time   SDES  09/04/2021 1703    URINE, CATHETERIZED Performed at [facility] [address], [place], [place] [address]    SPECREQUEST  09/04/2021 1703    NONE Performed at [facility], [place] [address], [place], [place] [address]    CULT (A) 09/04/2021 1703    1,000 COLONIES/mL STAPHYLOCOCCUS EPIDERMIDIS CALL MICROBIOLOGY LAB IF SENSITIVITIES ARE REQUIRED. Performed at [facility], [place] [address], [place], [place] [address]    REPTSTATUS 09/06/2021 FINAL 09/04/2021 1703    Other culture-see note  Radiology Studies: CT Head Wo Contrast  Result Date: 02/10/2022 CLINICAL DATA:  Head trauma, minor (Age >= 65y); Neck trauma (Age >= 65y) EXAM: CT HEAD WITHOUT CONTRAST CT CERVICAL SPINE WITHOUT CONTRAST  TECHNIQUE: Multidetector CT imaging of the head and cervical spine was performed following the standard protocol without intravenous contrast. Multiplanar CT image reconstructions of the cervical spine were also generated. RADIATION DOSE REDUCTION: This exam was performed according to the departmental dose-optimization program which includes automated exposure control, adjustment of the mA and/or kV according to patient size and/or use of iterative reconstruction technique. COMPARISON:  07/03/2021, 01/03/2022 FINDINGS: CT HEAD FINDINGS Brain: No evidence of acute infarction, hemorrhage, hydrocephalus, extra-axial collection or mass lesion/mass effect. Patchy low-density changes within the periventricular and subcortical white matter compatible with chronic microvascular ischemic change. Mild-moderate diffuse cerebral volume loss. Vascular: No hyperdense vessel or unexpected calcification. Skull: Normal. Negative for fracture or focal lesion. Sinuses/Orbits: No acute finding. Other: None. CT CERVICAL SPINE FINDINGS Alignment: Facet  joints are aligned without dislocation or traumatic listhesis. Dens and lateral masses are aligned. Grade 1 anterolisthesis of C2 on C3, C3 on C4, C4 on C5 as well as grade 2 anterolisthesis of C7 on T1, unchanged. Anterolisthesis mediated by degenerative facet joint arthropathy. Skull base and vertebrae: No acute fracture. No primary bone lesion or focal pathologic process. Soft tissues and spinal canal: No prevertebral fluid or swelling. No visible canal hematoma. Disc levels: Severe multilevel cervical spondylosis, similar to prior. Upper chest: Layering bilateral pleural effusions. Ground-glass opacity within the visualized lung apices with interlobular septal thickening. Aortic atherosclerosis. Other: None. IMPRESSION: 1. No acute intracranial abnormality. 2. No acute fracture or traumatic listhesis of the cervical spine. 3. Layering bilateral pleural effusions with findings suggestive of pulmonary edema. Electronically Signed   By: Davina Poke D.O.   On: 02/10/2022 08:13   CT Cervical Spine Wo Contrast  Result Date: 02/10/2022 CLINICAL DATA:  Head trauma, minor (Age >= 65y); Neck trauma (Age >= 65y) EXAM: CT HEAD WITHOUT CONTRAST CT CERVICAL SPINE WITHOUT CONTRAST TECHNIQUE: Multidetector CT imaging of the head and cervical spine was performed following the standard protocol without intravenous contrast. Multiplanar CT image reconstructions of the cervical spine were also generated. RADIATION DOSE REDUCTION: This exam was performed according to the departmental dose-optimization program which includes automated exposure control, adjustment of the mA and/or kV according to patient size and/or use of iterative reconstruction technique. COMPARISON:  07/03/2021, 01/03/2022 FINDINGS: CT HEAD FINDINGS Brain: No evidence of acute infarction, hemorrhage, hydrocephalus, extra-axial collection or mass lesion/mass effect. Patchy low-density changes within the periventricular and subcortical white matter  compatible with chronic microvascular ischemic change. Mild-moderate diffuse cerebral volume loss. Vascular: No hyperdense vessel or unexpected calcification. Skull: Normal. Negative for fracture or focal lesion. Sinuses/Orbits: No acute finding. Other: None. CT CERVICAL SPINE FINDINGS Alignment: Facet joints are aligned without dislocation or traumatic listhesis. Dens and lateral masses are aligned. Grade 1 anterolisthesis of C2 on C3, C3 on C4, C4 on C5 as well as grade 2 anterolisthesis of C7 on T1, unchanged. Anterolisthesis mediated by degenerative facet joint arthropathy. Skull base and vertebrae: No acute fracture. No primary bone lesion or focal pathologic process. Soft tissues and spinal canal: No prevertebral fluid or swelling. No visible canal hematoma. Disc levels: Severe multilevel cervical spondylosis, similar to prior. Upper chest: Layering bilateral pleural effusions. Ground-glass opacity within the visualized lung apices with interlobular septal thickening. Aortic atherosclerosis. Other: None. IMPRESSION: 1. No acute intracranial abnormality. 2. No acute fracture or traumatic listhesis of the cervical spine. 3. Layering bilateral pleural effusions with findings suggestive of pulmonary edema. Electronically Signed   By: Davina Poke D.O.   On: 02/10/2022 08:13   DG  Pelvis 1-2 Views  Result Date: 02/10/2022 CLINICAL DATA:  89 year old male status post fall with pain. EXAM: PELVIS - 1-2 VIEW COMPARISON:  CT Abdomen and Pelvis 06/12/2021. FINDINGS: AP view at 0726 hours. Partially visible chronic right hip arthroplasty, right femoral implant. Normal AP alignment. Visible hardware appears intact. Underlying bone mineralization is normal for age. Left femoral head normally located. Grossly intact proximal left femur. No pelvis fracture identified. Chronic dextroconvex lumbar scoliosis with severe disc and endplate degeneration. Negative visible bowel gas pattern. IMPRESSION: No acute fracture or  dislocation identified about the pelvis. Chronic right hip arthroplasty. If there is lateralizing hip pain recommend dedicated hip series. Electronically Signed   By: Genevie Ann M.D.   On: 02/10/2022 07:50   DG Chest 2 View  Result Date: 02/10/2022 CLINICAL DATA:  89 year old male status post fall with pain. EXAM: CHEST - 2 VIEW COMPARISON:  Portable chest 09/04/2021 and earlier. FINDINGS: Semi upright AP and lateral views of the chest at 0721 hours. Similar lung volumes. Stable cardiac size and mediastinal contours. With some cardiomegaly. Visualized tracheal air column is within normal limits. Increased bilateral pulmonary interstitial opacity, somewhat coarse and asymmetric. No pneumothorax, pleural effusion or consolidation. Widespread degenerative osseous changes. No acute osseous abnormality identified. Paucity of bowel gas in the visible abdomen. Cholecystectomy clips. IMPRESSION: 1. Somewhat low lung volumes but disproportionate increased bilateral pulmonary interstitial opacity, nonspecific. Consider viral/atypical respiratory infection, interstitial edema. No pleural effusion or consolidation. 2. No acute traumatic injury identified. Electronically Signed   By: Genevie Ann M.D.   On: 02/10/2022 07:49   DG Forearm Right  Result Date: 02/10/2022 CLINICAL DATA:  Fall with right forearm and elbow lacerations. EXAM: RIGHT FOREARM - 2 VIEW COMPARISON:  None Available. FINDINGS: There is irregularity in the skin in the distal forearm most likely indicating skin lacerations. Vascular calcifications are noted in the ventral distal forearm. The bones demonstrate osteopenia without evidence of fractures. There is advanced degenerative arthrosis of the first [facility] joint, mild narrowing of the radiocarpal joint. IMPRESSION: 1. No evidence of fractures. 2. Osteopenia and degenerative changes. 3. Soft tissue lacerations in the distal forearm. Electronically Signed   By: Telford Nab M.D.   On: 02/10/2022 07:47   DG  Elbow Complete Right  Result Date: 02/10/2022 CLINICAL DATA:  89 year old male status post fall with pain. EXAM: RIGHT ELBOW - COMPLETE 3+ VIEW COMPARISON:  None Available. FINDINGS: Maintained alignment and joint spaces. Bone mineralization is within normal limits for age. Radial head appears intact. No joint effusion identified. No acute osseous abnormality identified. Generalized soft tissue swelling. IMPRESSION: Soft tissue swelling. No acute fracture or dislocation identified about the right elbow. Electronically Signed   By: Genevie Ann M.D.   On: 02/10/2022 07:46   DG Shoulder Left  Result Date: 02/10/2022 CLINICAL DATA:  89 year old male status post fall with pain. EXAM: LEFT SHOULDER - 2+ VIEW COMPARISON:  Chest radiographs 09/04/2021 and earlier. FINDINGS: AP and scapular Y-view demonstrates severe glenohumeral joint space loss with subchondral sclerosis and degenerative spurring. No glenohumeral joint dislocation. Proximal left humerus appears intact. No left clavicle or scapula fracture identified. Grossly stable visible left chest, ribs. IMPRESSION: Severe glenohumeral joint degeneration. No acute fracture or dislocation identified about the left shoulder. Electronically Signed   By: Genevie Ann M.D.   On: 02/10/2022 07:45     LOS: 0 days   Antonieta Pert, MD [facility]  02/11/2022, 10:20 AM

## 2022-02-11 NOTE — ED Notes (Signed)
Patient states he was not hungry and did not want his breakfast tray but did ask for ice cream. [place] [employer] given to patient. JRPRN

## 2022-02-11 NOTE — Progress Notes (Addendum)
This CSW received a call from [place] at [facility]. [name] reports she will need hospice orders at time of d/c.

## 2022-02-12 ENCOUNTER — Inpatient Hospital Stay (HOSPITAL_COMMUNITY): Payer: Medicare Other

## 2022-02-12 DIAGNOSIS — I5031 Acute diastolic (congestive) heart failure: Secondary | ICD-10-CM | POA: Diagnosis not present

## 2022-02-12 DIAGNOSIS — I509 Heart failure, unspecified: Secondary | ICD-10-CM

## 2022-02-12 LAB — ECHOCARDIOGRAM COMPLETE
Area-P 1/2: 2.14 cm2
Calc EF: 57 %
Height: 60 in
MV VTI: 1.39 cm2
S' Lateral: 2.5 cm
Single Plane A2C EF: 58.5 %
Single Plane A4C EF: 60 %
Weight: 2056.45 oz

## 2022-02-12 LAB — CBC
HCT: 37.3 % — ABNORMAL LOW (ref 39.0–52.0)
Hemoglobin: 11.2 g/dL — ABNORMAL LOW (ref 13.0–17.0)
MCH: 23.3 pg — ABNORMAL LOW (ref 26.0–34.0)
MCHC: 30 g/dL (ref 30.0–36.0)
MCV: 77.5 fL — ABNORMAL LOW (ref 80.0–100.0)
Platelets: 65 10*3/uL — ABNORMAL LOW (ref 150–400)
RBC: 4.81 MIL/uL (ref 4.22–5.81)
RDW: 24.2 % — ABNORMAL HIGH (ref 11.5–15.5)
WBC: 32.6 10*3/uL — ABNORMAL HIGH (ref 4.0–10.5)
nRBC: 0 % (ref 0.0–0.2)

## 2022-02-12 LAB — MISC LABCORP TEST (SEND OUT): Labcorp test code: 81950

## 2022-02-12 LAB — BASIC METABOLIC PANEL
Anion gap: 7 (ref 5–15)
BUN: 17 mg/dL (ref 8–23)
CO2: 27 mmol/L (ref 22–32)
Calcium: 8.5 mg/dL — ABNORMAL LOW (ref 8.9–10.3)
Chloride: 105 mmol/L (ref 98–111)
Creatinine, Ser: 1.15 mg/dL (ref 0.61–1.24)
GFR, Estimated: >60 mL/min (ref >60)
Glucose, Bld: 104 mg/dL — ABNORMAL HIGH (ref 70–99)
Potassium: 3.7 mmol/L (ref 3.5–5.1)
Sodium: 139 mmol/L (ref 135–145)

## 2022-02-12 MED ORDER — PERFLUTREN LIPID MICROSPHERE
1.0000 mL | INTRAVENOUS | Status: AC | PRN
  Administered 2022-02-12: 4 mL via INTRAVENOUS

## 2022-02-12 NOTE — Plan of Care (Signed)
  Problem: Education: Goal: Ability to demonstrate management of disease process will improve Outcome: Completed/Met Goal: Ability to verbalize understanding of medication therapies will improve Outcome: Completed/Met Goal: Individualized Educational Video(s) Outcome: Completed/Met   Problem: Activity: Goal: Capacity to carry out activities will improve Outcome: Completed/Met   Problem: Cardiac: Goal: Ability to achieve and maintain adequate cardiopulmonary perfusion will improve Outcome: Completed/Met   Problem: Education: Goal: Knowledge of General Education information will improve Description: Including pain rating scale, medication(s)/side effects and non-pharmacologic comfort measures Outcome: Completed/Met   Problem: Health Behavior/Discharge Planning: Goal: Ability to manage health-related needs will improve Outcome: Completed/Met   Problem: Clinical Measurements: Goal: Ability to maintain clinical measurements within normal limits will improve Outcome: Completed/Met Goal: Will remain free from infection Outcome: Completed/Met Goal: Diagnostic test results will improve Outcome: Completed/Met Goal: Respiratory complications will improve Outcome: Completed/Met Goal: Cardiovascular complication will be avoided Outcome: Completed/Met   Problem: Activity: Goal: Risk for activity intolerance will decrease Outcome: Completed/Met   Problem: Nutrition: Goal: Adequate nutrition will be maintained Outcome: Completed/Met   Problem: Coping: Goal: Level of anxiety will decrease Outcome: Completed/Met   Problem: Elimination: Goal: Will not experience complications related to bowel motility Outcome: Completed/Met Goal: Will not experience complications related to urinary retention Outcome: Completed/Met   Problem: Pain Managment: Goal: General experience of comfort will improve Outcome: Completed/Met   Problem: Safety: Goal: Ability to remain free from injury will  improve Outcome: Completed/Met   Problem: Skin Integrity: Goal: Risk for impaired skin integrity will decrease Outcome: Completed/Met   

## 2022-02-12 NOTE — Progress Notes (Signed)
PROGRESS NOTE DODD SCHMID  KNL:[id number] DOB: [birth year] DOA: 02/10/2022 PCP: Patient, No Pcp Per   Brief Narrative/Hospital Course: 89 year old male with multiple medical history of osteoarthritis, BPH, chronic dysuria with elevated PSA/history of prostatitis, chronic idiopathic monocytosis, CAD, cervical and lumbar DJD, diverticulosis, hiatal hernia, colonic polyps history of remote episodes of A-fib, COVID-19 infection, history of pancreatitis, hyperlipidemia hypertension nephrolithiasis OSA not on CPAP due to intolerance, pernicious anemia due to B12 deficiency Peyronie's disease renal insufficiency presented to the ED after episode of fall outside office home.  In the ED vitals are stable, labs showed significant leukocytosis 33.1 chronic microcytic anemia thrombocytopenia 42 mild hypokalemia normal CK, extensive imaging negative for fractures, but positive for CHF along with elevated BNP.  Chest x-ray low lung volumes but increased bilateral pulmonary interstitial opacity. Patient was admitted for CHF exacerbation.  He has had decline at this current impairment/dementia, he is being followed by hospice at home. TTE shoed normal lvef 55-60%, No RWMA, diastolic parameters indeterminate.  Subjective: Seen examined this morning.  Alert awake oriented to self not to place.  Assessment and Plan: Principal Problem:   Acute CHF (congestive heart failure) (HCC) Active Problems:   HLD (hyperlipidemia)   Essential hypertension   Coronary atherosclerosis   Thrombocytopenia (HCC)   BPH (benign prostatic hyperplasia)   Depression   GERD (gastroesophageal reflux disease)   Chronic anxiety   CML (chronic myelocytic leukemia) ([place])   Concern for acute congestive heart failure elevated BNP chest x-ray abnormal.  Echocardiogram unremarkable, he did improve with IV Lasix.  Resume oral Lasix, monitor intake output Daily weight.  Defer troponin check at this time, patient is elderly currently at home  hospice  Deconditioning/debility /fall at home: No evidence of acute fractures, PT OT evaluation is pending.  B12 stable procalcitonin negative B12 TSH stable.  CML with Leukocytosis Chronic thrombocytopenia Chronic microcytic anemia: UA unremarkable, chest x-ray without overt pneumonia.  No evidence of infection.  Procalcitonin unremarkable, continues Decadron. He follows with Dr. Lorenso Courier from oncology for [facility] has had recent decline, currently at home with hospice> planning to return home with hospice  Essential hypertension: BP stable Coronary atherosclerosis not on meds was started low-dose aspirin but will defer as less likely beneficial due to his debility dementia and at home with hospice  GERD on Pepcid Suspect acute metabolic encephalopathy in the setting of background dementia. Early stage of dementia currently confused has had recent decline, continue PT OT, supportive care delirium precaution. Goals of care: Currently DNR, lives with family at home and has home hospice  DVT prophylaxis: SCDs Start: 02/10/22 1138 Code Status:   Code Status: DNR Family Communication: plan of care discussed with patient/updated patient's daughter over the phone Patient status is: admitted as observation but remains hospitalized for ongoing evaluation for fall, CHF.  Level of care: Telemetry  Dispo: The patient is from: Home with hospice with family            Anticipated disposition: Return back to previous state Objective: Vitals last 24 hrs: Vitals:   02/12/22 1116 02/12/22 1146 02/12/22 1147 02/12/22 1247  BP:    112/67  Pulse:    95  Resp: '20 20 20 16  '$ Temp:    98.7 F (37.1 C)  TempSrc:    Oral  SpO2:    92%  Weight:      Height:       Weight change: -2.7 kg  Physical Examination: General exam: AAox1, weak,older appearing HEENT:Oral mucosa moist, Ear/Nose  WNL grossly, dentition normal. Respiratory system: bilaterally clear BS, no use of accessory muscle Cardiovascular system: S1  & S2 +, regular rate,. Gastrointestinal system: Abdomen soft, NT,ND,BS+ Nervous System:Alert, awake, moving extremities and grossly nonfocal Extremities: LE ankle edema neg, lower extremities warm Skin: No rashes,no icterus. MSK: Normal muscle bulk,tone, power   Medications reviewed:  Scheduled Meds:  ARIPiprazole  2 mg Oral Daily   dexamethasone  2 mg Oral Daily   famotidine  20 mg Oral Daily   feeding supplement  237 mL Oral TID BM   fluticasone  1 spray Each Nare BID   gabapentin  200 mg Oral BID   loratadine  10 mg Oral Daily   mineral oil-hydrophilic petrolatum  1 Application Topical Daily   mirabegron ER  25 mg Oral QHS   sertraline  100 mg Oral Daily   tamsulosin  0.8 mg Oral Daily   Continuous Infusions:  Unresulted Labs (From admission, onward)     Start     Ordered   02/12/22 8315  Basic metabolic panel  Daily,   R      02/10/22 1141   02/11/22 0500  CBC  Daily,   R      02/10/22 1141          Data Reviewed: I have personally reviewed following labs and imaging studies CBC: Recent Labs  Lab 02/10/22 0900 02/11/22 0519 02/12/22 0446  WBC 33.1* 30.2* 32.6*  NEUTROABS 19.2*  --   --   HGB 10.0* 10.6* 11.2*  HCT 33.0* 35.5* 37.3*  MCV 77.3* 78.4* 77.5*  PLT 42* 49* 65*    Basic Metabolic Panel: Recent Labs  Lab 02/10/22 0900 02/11/22 0519 02/12/22 0446  NA 140 140 139  K 3.4* 4.5 3.7  CL 107 106 105  CO2 '24 23 27  '$ GLUCOSE 95 103* 104*  BUN '16 14 17  '$ CREATININE 0.99 1.09 1.15  CALCIUM 8.4* 8.6* 8.5*    GFR: Estimated Creatinine Clearance: 32.6 mL/min (by C-G formula based on SCr of 1.15 mg/dL). Liver Function Tests: Recent Labs  Lab 02/11/22 0519  AST 21  ALT 16  ALKPHOS 80  BILITOT 1.2  PROT 5.2*  ALBUMIN 3.4*    Recent Labs  Lab 02/11/22 0915  PROCALCITON <0.10   No results found for this or any previous visit (from the past 240 hour(s)).  Antimicrobials: Anti-infectives (From admission, onward)    None     Other  culture-see note  Radiology Studies: ECHOCARDIOGRAM COMPLETE  Result Date: 02/12/2022    ECHOCARDIOGRAM REPORT   Patient Name:   Mico A Schrade Date of Exam: 02/12/2022 Medical Rec #:  [id number]        Height:       60.0 in Accession #:    [id number]       Weight:       128.5 lb Date of Birth:  02-05-1933        BSA:          1.547 m Patient Age:    32 years         BP:           112/61 mmHg Patient Gender: M                HR:           95 bpm. Exam Location:  Inpatient Procedure: 2D Echo, Cardiac Doppler, Color Doppler and Intracardiac            Opacification  Agent Indications:    Congestive Heart Failure I50.9  History:        Patient has no prior history of Echocardiogram examinations.                 Arrythmias:Atrial Fibrillation; Risk Factors:Hypertension,                 Dyslipidemia and Sleep Apnea.  Sonographer:    Bernadene Person RDCS Referring Phys: [id number] [place]  Sonographer Comments: definity attempted. It did not show up. IMPRESSIONS  1. Left ventricular ejection fraction, by estimation, is 55 to 60%. The left ventricle has normal function. The left ventricle has no regional wall motion abnormalities. Left ventricular diastolic parameters are indeterminate.  2. Right ventricular systolic function is normal. The right ventricular size is normal. There is moderately elevated pulmonary artery systolic pressure.  3. The mitral valve is degenerative. Trivial mitral valve regurgitation. No evidence of mitral stenosis. Moderate mitral annular calcification.  4. The aortic valve is tricuspid. There is moderate calcification of the aortic valve. There is moderate thickening of the aortic valve. Aortic valve regurgitation is trivial. Aortic valve sclerosis is present, with no evidence of aortic valve stenosis.  5. The inferior vena cava is normal in size with greater than 50% respiratory variability, suggesting right atrial pressure of 3 mmHg. FINDINGS  Left Ventricle: Left ventricular  ejection fraction, by estimation, is 55 to 60%. The left ventricle has normal function. The left ventricle has no regional wall motion abnormalities. Definity contrast agent was given IV to delineate the left ventricular  endocardial borders. The left ventricular internal cavity size was normal in size. There is no left ventricular hypertrophy. Left ventricular diastolic parameters are indeterminate. Right Ventricle: The right ventricular size is normal. No increase in right ventricular wall thickness. Right ventricular systolic function is normal. There is moderately elevated pulmonary artery systolic pressure. The tricuspid regurgitant velocity is 3.33 m/s, and with an assumed right atrial pressure of 3 mmHg, the estimated right ventricular systolic pressure is 81.7 mmHg. Left Atrium: Left atrial size was normal in size. Right Atrium: Right atrial size was normal in size. Pericardium: There is no evidence of pericardial effusion. Mitral Valve: The mitral valve is degenerative in appearance. There is mild thickening of the mitral valve leaflet(s). There is mild calcification of the mitral valve leaflet(s). Moderate mitral annular calcification. Trivial mitral valve regurgitation. No evidence of mitral valve stenosis. MV peak gradient, 15.7 mmHg. The mean mitral valve gradient is 6.0 mmHg. Tricuspid Valve: The tricuspid valve is normal in structure. Tricuspid valve regurgitation is mild . No evidence of tricuspid stenosis. Aortic Valve: The aortic valve is tricuspid. There is moderate calcification of the aortic valve. There is moderate thickening of the aortic valve. Aortic valve regurgitation is trivial. Aortic valve sclerosis is present, with no evidence of aortic valve  stenosis. Pulmonic Valve: The pulmonic valve was normal in structure. Pulmonic valve regurgitation is not visualized. No evidence of pulmonic stenosis. Aorta: The aortic root is normal in size and structure. Venous: The inferior vena cava is  normal in size with greater than 50% respiratory variability, suggesting right atrial pressure of 3 mmHg. IAS/Shunts: No atrial level shunt detected by color flow Doppler.  LEFT VENTRICLE PLAX 2D LVIDd:         4.10 cm     Diastology LVIDs:         2.50 cm     LV e' medial:    3.48 cm/s LV PW:  0.80 cm     LV E/e' medial:  35.3 LV IVS:        0.80 cm     LV e' lateral:   4.79 cm/s LVOT diam:     1.80 cm     LV E/e' lateral: 25.7 LV SV:         51 LV SV Index:   33 LVOT Area:     2.54 cm  LV Volumes (MOD) LV vol d, MOD A2C: 56.9 ml LV vol d, MOD A4C: 67.5 ml LV vol s, MOD A2C: 23.6 ml LV vol s, MOD A4C: 27.0 ml LV SV MOD A2C:     33.3 ml LV SV MOD A4C:     67.5 ml LV SV MOD BP:      35.4 ml RIGHT VENTRICLE RV S prime:     13.80 cm/s TAPSE (M-mode): 1.8 cm LEFT ATRIUM             Index        RIGHT ATRIUM           Index LA diam:        2.90 cm 1.87 cm/m   RA Area:     13.00 cm LA Vol (A2C):   43.1 ml 27.86 ml/m  RA Volume:   24.80 ml  16.03 ml/m LA Vol (A4C):   53.2 ml 34.39 ml/m LA Biplane Vol: 48.5 ml 31.35 ml/m  AORTIC VALVE LVOT Vmax:   124.33 cm/s LVOT Vmean:  80.267 cm/s LVOT VTI:    0.200 m  AORTA Ao Root diam: 3.30 cm Ao Asc diam:  3.10 cm MITRAL VALVE                TRICUSPID VALVE MV Area (PHT): 2.14 cm     TR Peak grad:   44.4 mmHg MV Area VTI:   1.39 cm     TR Vmax:        333.00 cm/s MV Peak grad:  15.7 mmHg MV Mean grad:  6.0 mmHg     SHUNTS MV Vmax:       1.98 m/s     Systemic VTI:  0.20 m MV Vmean:      108.0 cm/s   Systemic Diam: 1.80 cm MV Decel Time: 354 msec MV E velocity: 123.00 cm/s MV A velocity: 186.00 cm/s MV E/A ratio:  0.66 Jenkins Rouge MD Electronically signed by Jenkins Rouge MD Signature Date/Time: 02/12/2022/2:05:01 PM    Final      LOS: 1 day   Antonieta Pert, MD [facility]  02/12/2022, 3:00 PM

## 2022-02-12 NOTE — Progress Notes (Signed)
Echocardiogram 2D Echocardiogram has been performed.  [name] 02/12/2022, 1:51 PM

## 2022-02-12 NOTE — Evaluation (Signed)
Occupational Therapy Evaluation Patient Details Name: [name] MRN: [id number] DOB: Sep 23, 1933 Today's Date: 02/12/2022   History of Present Illness Pt is 89 yo male admitted 02/12/22 with acute CHF.  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   Clinical Impression   [name] is an 89 year old man who is currently enrolled in Hospice at home and has 24/7 caregivers. He has had a decline in functional abilities recently per his daughter. He is needing increased assistance with ADLs, needs a walker to ambulate and uses a lift chair to get up. Patient required mod assist for bed transfers and min assist to stand. He exhibited ability to take a couple steps - though his steps were minuscule in step length. From a functional standpoint I don't think patient needs OT services at discharge - nor do I think they are offered in the hospice setting. He is okay to discharge home with 24/7 assist. Hospice may need to increase services to assist family with burden of care.      Recommendations for follow up therapy are one component of a multi-disciplinary discharge planning process, led by the attending physician.  Recommendations may be updated based on patient status, additional functional criteria and insurance authorization.   Follow Up Recommendations  No OT follow up     Assistance Recommended at Discharge Frequent or constant Supervision/Assistance  Patient can return home with the following A little help with walking and/or transfers;A lot of help with bathing/dressing/bathroom    Functional Status Assessment  Patient has had a recent decline in their functional status and demonstrates the ability to make significant improvements in function in a reasonable and predictable amount of time.  Equipment Recommendations  None recommended by OT    Recommendations for Other Services        Precautions / Restrictions Precautions Precautions: Fall Restrictions Weight Bearing Restrictions: No      Mobility Bed Mobility Overal bed mobility: Needs Assistance Bed Mobility: Supine to Sit, Sit to Supine     Supine to sit: Mod assist Sit to supine: Mod assist   General bed mobility comments: Pt sleeps in lift chair    Transfers Overall transfer level: Needs assistance Equipment used: Rolling walker (2 wheels) Transfers: Sit to/from Stand Sit to Stand: Min assist           General transfer comment: Stood x 2 with light min A      Balance Overall balance assessment: Needs assistance Sitting-balance support: No upper extremity supported, Feet supported Sitting balance-Leahy Scale: Poor Sitting balance - Comments: Requiring UE support and with fatigue/challenges leans posteriorly requiring min A   Standing balance support: Bilateral upper extremity supported Standing balance-Leahy Scale: Poor Standing balance comment: RW and min guard-min A                           ADL either performed or assessed with clinical judgement   ADL Overall ADL's : Needs assistance/impaired Eating/Feeding: Maximal assistance;Set up   Grooming: Moderate assistance;Set up   Upper Body Bathing: Maximal assistance   Lower Body Bathing: Maximal assistance   Upper Body Dressing : Maximal assistance   Lower Body Dressing: Total assistance   Toilet Transfer: Minimal assistance;BSC/3in1;Rolling walker (2 wheels)   Toileting- Clothing Manipulation and Hygiene: Total assistance;Sit to/from stand       Functional mobility during  ADLs: Minimal assistance;Rolling walker (2 wheels)       Vision Patient Visual Report: No change from baseline       Perception     Praxis      Pertinent Vitals/Pain Pain Assessment Pain Assessment: No/denies pain     Hand Dominance     Extremity/Trunk Assessment Upper Extremity Assessment Upper Extremity Assessment: RUE  deficits/detail;LUE deficits/detail RUE Deficits / Details: Poor shoulder ROM but otherwsie functional LUE Deficits / Details: Poor shoulder ROM but otherwsie functional  -  no report of pain today but crepitus noted   Lower Extremity Assessment Lower Extremity Assessment: Overall WFL for tasks assessed   Cervical / Trunk Assessment Cervical / Trunk Assessment: Kyphotic   Communication Communication Communication: Expressive difficulties   Cognition Arousal/Alertness: Awake/alert Behavior During Therapy: WFL for tasks assessed/performed Overall Cognitive Status: History of cognitive impairments - at baseline                                       General Comments  Skin with multiple lesions and rash appearance - dtr reports leukemia to his skin    Exercises     Shoulder Instructions      Home Living Family/patient expects to be discharged to:: Private residence Living Arrangements: Children Available Help at Discharge: Available 24 hours/day;Family;Other (Comment);Personal care attendant (home hospice and aides) Type of Home: House Home Access: Stairs to enter [employer] of Steps: 2 Entrance Stairs-Rails: Right;Left Home Layout: One level     Bathroom Shower/Tub: Sponge bathes at [place]: Conservation officer, nature (2 wheels);Hospital bed;BSC/3in1;Wheelchair - manual   Additional Comments: lift chair; Home O2; hospice recently delivered with bed      Prior Functioning/Environment Prior Level of Function : Needs assist             Mobility Comments: Pt was at home on hospice and reports declining mobility but was still able to ambulate with assist and RW; several recent falls ADLs Comments: Aides was assisting with sponge baths ; family assisting with dressing and toielting        OT Problem List: Decreased strength;Decreased range of motion;Decreased activity tolerance;Impaired balance (sitting and/or  standing);Decreased cognition;Decreased safety awareness;Impaired UE functional use;Decreased knowledge of use of DME or AE      OT Treatment/Interventions:      OT Goals(Current goals can be found in the care plan section) Acute Rehab OT Goals OT Goal Formulation: All assessment and education complete, DC therapy  OT Frequency:      Co-evaluation   Reason for Co-Treatment: For patient/therapist safety PT goals addressed during session: Mobility/safety with mobility        AM-PAC OT "6 Clicks" Daily Activity     Outcome Measure Help from another person eating meals?: A Lot Help from another person taking care of personal grooming?: A Lot Help from another person toileting, which includes using toliet, bedpan, or urinal?: Total Help from another person bathing (including washing, rinsing, drying)?: A Lot Help from another person to put on and taking off regular upper body clothing?: A Lot Help from another person to put on and taking off regular lower body clothing?: Total 6 Click Score: 10   End of Session Equipment Utilized During Treatment: Gait belt Nurse Communication: Mobility status  Activity Tolerance: Patient tolerated treatment well Patient left: in bed;with call bell/phone within reach;with bed  alarm set;with family/visitor present  OT Visit Diagnosis: History of falling (Z91.81)                Time: 2527-1292 OT Time Calculation (min): 24 min Charges:  OT General Charges $OT Visit: 1 Visit OT Evaluation $OT Eval Low Complexity: 1 Low  Gustavo Lah, OTR/L [place]  Office [phone]   Lenward Chancellor 02/12/2022, 4:32 PM

## 2022-02-12 NOTE — Progress Notes (Signed)
Physician Discharge Summary  [name] WUJ:[id number] DOB: [birth year] DOA: 02/10/2022  PCP: Patient, No Pcp Per  Admit date: 02/10/2022 Discharge date: 02/12/2022 Recommendations for Outpatient Follow-up:  Follow up with PCP in 1 weeks-call for appointment Please obtain BMP/CBC in one week  Discharge Dispo: Home Discharge Condition: Stable Code Status:   Code Status: Prior Diet recommendation:  Diet Order             Diet - low sodium heart healthy                    Brief/Interim Summary: 89 year old male with multiple medical history of osteoarthritis, BPH, chronic dysuria with elevated PSA/history of prostatitis, chronic idiopathic monocytosis, CAD, cervical and lumbar DJD, diverticulosis, hiatal hernia, colonic polyps history of remote episodes of A-fib, COVID-19 infection, history of pancreatitis, hyperlipidemia hypertension nephrolithiasis OSA not on CPAP due to intolerance, pernicious anemia due to B12 deficiency Peyronie's disease renal insufficiency presented to the ED after episode of fall outside office home.  In the ED vitals are stable, labs showed significant leukocytosis 33.1 chronic microcytic anemia thrombocytopenia 42 mild hypokalemia normal CK, extensive imaging negative for fractures, but positive for CHF along with elevated BNP.  Chest x-ray low lung volumes but increased bilateral pulmonary interstitial opacity. Patient was admitted for CHF exacerbation.  He has had decline at this current impairment/dementia, he is being followed by hospice at home. TTE shoed normal lvef 55-60%, No RWMA, diastolic parameters indeterminate.  Discharge Diagnoses:  Principal Problem:   Acute CHF (congestive heart failure) (HCC) Active Problems:   HLD (hyperlipidemia)   Essential hypertension   Coronary atherosclerosis   Thrombocytopenia (HCC)   BPH (benign prostatic hyperplasia)   Depression   GERD (gastroesophageal reflux disease)   Chronic anxiety   CML (chronic  myelocytic leukemia) (HCC)  Acute congestive heart failure w/ preserved EF : Admitted with elevated BNP chest x-ray abnormal,Echocardiogram with preserved EF indeterminate diastolic function. He did improve with IV Lasix.  Resume oral Lasix, monitor intake output Daily weight.  Defer troponin check at this time, patient will resume home hospice   Deconditioning/debility /fall at home: No evidence of acute fractures, PT OT evaluation done.B12 stable procalcitonin negative B12 TSH stable.   CML with Leukocytosis Chronic thrombocytopenia Chronic microcytic anemia: UA unremarkable,chest x-ray without overt pneumonia.  No evidence of infection.  Procalcitonin unremarkable, continues Decadron. He follows with Dr. Leonides Schanz from oncology for [facility] has had recent decline, currently at home with hospice> planning to return home with hospice   Essential hypertension:BP stable Coronary atherosclerosis not on meds was started low-dose aspirin but will defer as less likely beneficial due to his debility dementia and at home with hospice  GERD on Pepcid Suspect acute metabolic encephalopathy in the setting of background dementia. Early stage of dementia currently confused has had recent decline, continue PT OT, supportive care delirium precaution. Goals of care: Currently DNR, lives with family at home and has home hospice. Pressure Injury 02/11/22 Sacrum Stage 1 -  Intact skin with non-blanchable redness of a localized area usually over a bony prominence. (Active)  02/11/22 1600  Location: Sacrum  Location Orientation:   Staging: Stage 1 -  Intact skin with non-blanchable redness of a localized area usually over a bony prominence.  Wound Description (Comments):   Present on Admission: Yes  Dressing Type Foam - Lift dressing to assess site every shift 02/12/22 0800    Consults: Ptot  Subjective: Alert oriented with baseline confusion  Discharge Exam:  Vitals:   02/12/22 1247 02/12/22 1900  BP: 112/67  114/69  Pulse: 95 90  Resp: 16 17  Temp: 98.7 F (37.1 C) 98.6 F (37 C)  SpO2: 92% 93%   General: Pt is alert, awake, not in acute distress Cardiovascular: RRR, S1/S2 +, no rubs, no gallops Respiratory: CTA bilaterally, no wheezing, no rhonchi Abdominal: Soft, NT, ND, bowel sounds + Extremities: no edema, no cyanosis  Discharge Instructions  Discharge Instructions     Diet - low sodium heart healthy   Complete by: As directed    Discharge instructions   Complete by: As directed    Please call call MD or return to ER for similar or worsening recurring problem that brought you to hospital or if any fever,nausea/vomiting,abdominal pain, uncontrolled pain, chest pain,  shortness of breath or any other alarming symptoms.  Please follow-up your doctor as instructed in a week time and call the office for appointment.  Please avoid alcohol, smoking, or any other illicit substance and maintain healthy habits including taking your regular medications as prescribed.  You were cared for by a hospitalist during your hospital stay. If you have any questions about your discharge medications or the care you received while you were in the hospital after you are discharged, you can call the unit and ask to speak with the hospitalist on call if the hospitalist that took care of you is not available.  Once you are discharged, your primary care physician will handle any further medical issues. Please note that NO REFILLS for any discharge medications will be authorized once you are discharged, as it is imperative that you return to your primary care physician (or establish a relationship with a primary care physician if you do not have one) for your aftercare needs so that they can reassess your need for medications and monitor your lab values   Discharge wound care:   Complete by: As directed    Foam dressing on sacrum   Increase activity slowly   Complete by: As directed       Allergies as of  02/12/2022       Reactions   Penicillins Hives, Other (See Comments)   Whelps, passed out Tolerates cephalosporins Has patient had a PCN reaction causing immediate rash, facial/tongue/throat swelling, SOB or lightheadedness with hypotension:  yes Has patient had a PCN reaction causing severe rash involving mucus membranes or skin necrosis: no Has patient had a PCN reaction that required hospitalization: no Has patient had a PCN reaction occurring within the last 10 years: no If all of the above answers are "NO", then may proceed with Cephalosporin use.   Tape Hives, Other (See Comments)   Paper Tape        Medication List     TAKE these medications    acetaminophen 500 MG tablet Commonly known as: TYLENOL Take 500 mg by mouth every 6 (six) hours as needed for mild pain.   ARIPiprazole 2 MG tablet Commonly known as: ABILIFY Take 2 mg by mouth daily.   dexamethasone 2 MG tablet Commonly known as: DECADRON Take 2 mg by mouth daily.   diclofenac Sodium 1 % Gel Commonly known as: Voltaren Arthritis Pain Apply 2 g topically 3 (three) times daily as needed. What changed: reasons to take this   Ensure Take 237 mLs by mouth 3 (three) times daily between meals. Chocolate   famotidine 20 MG tablet Commonly known as: PEPCID TAKE 1 TABLET BY MOUTH DAILY FOR ITCHING What changed:  See the new instructions.   fluticasone 50 MCG/ACT nasal spray Commonly known as: FLONASE Place 1 spray into both nostrils 2 (two) times daily.   gabapentin 100 MG capsule Commonly known as: NEURONTIN Take 2 capsules (200 mg total) by mouth 2 (two) times daily.   Gemtesa 75 MG Tabs Generic drug: Vibegron Take 75 mg by mouth at bedtime.   hydrOXYzine 25 MG tablet Commonly known as: ATARAX Take 25 mg by mouth at bedtime.   Lasix 20 MG tablet Generic drug: furosemide Take 20 mg by mouth daily. X 3 days   loratadine 10 MG tablet Commonly known as: CLARITIN Take 1 tablet (10 mg total) by  mouth daily. For allergies   LORazepam 0.5 MG tablet Commonly known as: ATIVAN Take 0.5 mg by mouth every 8 (eight) hours as needed for anxiety.   mineral oil-hydrophilic petrolatum ointment Apply 1 Application topically daily.   sertraline 100 MG tablet Commonly known as: ZOLOFT Take 1 tablet (100 mg total) by mouth daily.   tamsulosin 0.4 MG Caps capsule Commonly known as: FLOMAX Take 2 capsules (0.8 mg total) by mouth daily. For urine flow   TEARS NATURALE OP Place 1-2 drops into both eyes at bedtime as needed (dry eyes).   triamcinolone cream 0.5 % Commonly known as: KENALOG Apply 1 Application topically 2 (two) times daily.               Discharge Care Instructions  (From admission, onward)           Start     Ordered   02/12/22 0000  Discharge wound care:       Comments: Foam dressing on sacrum   02/12/22 1500            Allergies  Allergen Reactions   Penicillins Hives and Other (See Comments)    Whelps, passed out Tolerates cephalosporins  Has patient had a PCN reaction causing immediate rash, facial/tongue/throat swelling, SOB or lightheadedness with hypotension:  yes Has patient had a PCN reaction causing severe rash involving mucus membranes or skin necrosis: no Has patient had a PCN reaction that required hospitalization: no Has patient had a PCN reaction occurring within the last 10 years: no If all of the above answers are "NO", then may proceed with Cephalosporin use.    Tape Hives and Other (See Comments)    Paper Tape    The results of significant diagnostics from this hospitalization (including imaging, microbiology, ancillary and laboratory) are listed below for reference.    Microbiology: No results found for this or any previous visit (from the past 240 hour(s)).  Procedures/Studies: ECHOCARDIOGRAM COMPLETE  Result Date: 02/12/2022    ECHOCARDIOGRAM REPORT   Patient Name:   Kendrick A Scarlata Date of Exam: 02/12/2022 Medical  Rec #:  [id number]        Height:       60.0 in Accession #:    [id number]       Weight:       128.5 lb Date of Birth:  [birth year]        BSA:          1.547 m Patient Age:    88 years         BP:           112/61 mmHg Patient Gender: M                HR:           95 bpm.  Exam Location:  Inpatient Procedure: 2D Echo, Cardiac Doppler, Color Doppler and Intracardiac            Opacification Agent Indications:    Congestive Heart Failure I50.9  History:        Patient has no prior history of Echocardiogram examinations.                 Arrythmias:Atrial Fibrillation; Risk Factors:Hypertension,                 Dyslipidemia and Sleep Apnea.  Sonographer:    Eulah Pont RDCS Referring Phys: [id number] DAVID MANUEL ORTIZ  Sonographer Comments: definity attempted. It did not show up. IMPRESSIONS  1. Left ventricular ejection fraction, by estimation, is 55 to 60%. The left ventricle has normal function. The left ventricle has no regional wall motion abnormalities. Left ventricular diastolic parameters are indeterminate.  2. Right ventricular systolic function is normal. The right ventricular size is normal. There is moderately elevated pulmonary artery systolic pressure.  3. The mitral valve is degenerative. Trivial mitral valve regurgitation. No evidence of mitral stenosis. Moderate mitral annular calcification.  4. The aortic valve is tricuspid. There is moderate calcification of the aortic valve. There is moderate thickening of the aortic valve. Aortic valve regurgitation is trivial. Aortic valve sclerosis is present, with no evidence of aortic valve stenosis.  5. The inferior vena cava is normal in size with greater than 50% respiratory variability, suggesting right atrial pressure of 3 mmHg. FINDINGS  Left Ventricle: Left ventricular ejection fraction, by estimation, is 55 to 60%. The left ventricle has normal function. The left ventricle has no regional wall motion abnormalities. Definity contrast agent was given IV to  delineate the left ventricular  endocardial borders. The left ventricular internal cavity size was normal in size. There is no left ventricular hypertrophy. Left ventricular diastolic parameters are indeterminate. Right Ventricle: The right ventricular size is normal. No increase in right ventricular wall thickness. Right ventricular systolic function is normal. There is moderately elevated pulmonary artery systolic pressure. The tricuspid regurgitant velocity is 3.33 m/s, and with an assumed right atrial pressure of 3 mmHg, the estimated right ventricular systolic pressure is 47.4 mmHg. Left Atrium: Left atrial size was normal in size. Right Atrium: Right atrial size was normal in size. Pericardium: There is no evidence of pericardial effusion. Mitral Valve: The mitral valve is degenerative in appearance. There is mild thickening of the mitral valve leaflet(s). There is mild calcification of the mitral valve leaflet(s). Moderate mitral annular calcification. Trivial mitral valve regurgitation. No evidence of mitral valve stenosis. MV peak gradient, 15.7 mmHg. The mean mitral valve gradient is 6.0 mmHg. Tricuspid Valve: The tricuspid valve is normal in structure. Tricuspid valve regurgitation is mild . No evidence of tricuspid stenosis. Aortic Valve: The aortic valve is tricuspid. There is moderate calcification of the aortic valve. There is moderate thickening of the aortic valve. Aortic valve regurgitation is trivial. Aortic valve sclerosis is present, with no evidence of aortic valve  stenosis. Pulmonic Valve: The pulmonic valve was normal in structure. Pulmonic valve regurgitation is not visualized. No evidence of pulmonic stenosis. Aorta: The aortic root is normal in size and structure. Venous: The inferior vena cava is normal in size with greater than 50% respiratory variability, suggesting right atrial pressure of 3 mmHg. IAS/Shunts: No atrial level shunt detected by color flow Doppler.  LEFT VENTRICLE PLAX  2D LVIDd:         4.10 cm     Diastology  LVIDs:         2.50 cm     LV e' medial:    3.48 cm/s LV PW:         0.80 cm     LV E/e' medial:  35.3 LV IVS:        0.80 cm     LV e' lateral:   4.79 cm/s LVOT diam:     1.80 cm     LV E/e' lateral: 25.7 LV SV:         51 LV SV Index:   33 LVOT Area:     2.54 cm  LV Volumes (MOD) LV vol d, MOD A2C: 56.9 ml LV vol d, MOD A4C: 67.5 ml LV vol s, MOD A2C: 23.6 ml LV vol s, MOD A4C: 27.0 ml LV SV MOD A2C:     33.3 ml LV SV MOD A4C:     67.5 ml LV SV MOD BP:      35.4 ml RIGHT VENTRICLE RV S prime:     13.80 cm/s TAPSE (M-mode): 1.8 cm LEFT ATRIUM             Index        RIGHT ATRIUM           Index LA diam:        2.90 cm 1.87 cm/m   RA Area:     13.00 cm LA Vol (A2C):   43.1 ml 27.86 ml/m  RA Volume:   24.80 ml  16.03 ml/m LA Vol (A4C):   53.2 ml 34.39 ml/m LA Biplane Vol: 48.5 ml 31.35 ml/m  AORTIC VALVE LVOT Vmax:   124.33 cm/s LVOT Vmean:  80.267 cm/s LVOT VTI:    0.200 m  AORTA Ao Root diam: 3.30 cm Ao Asc diam:  3.10 cm MITRAL VALVE                TRICUSPID VALVE MV Area (PHT): 2.14 cm     TR Peak grad:   44.4 mmHg MV Area VTI:   1.39 cm     TR Vmax:        333.00 cm/s MV Peak grad:  15.7 mmHg MV Mean grad:  6.0 mmHg     SHUNTS MV Vmax:       1.98 m/s     Systemic VTI:  0.20 m MV Vmean:      108.0 cm/s   Systemic Diam: 1.80 cm MV Decel Time: 354 msec MV E velocity: 123.00 cm/s MV A velocity: 186.00 cm/s MV E/A ratio:  0.66 Charlton Haws MD Electronically signed by Charlton Haws MD Signature Date/Time: 02/12/2022/2:05:01 PM    Final    CT Head Wo Contrast  Result Date: 02/10/2022 CLINICAL DATA:  Head trauma, minor (Age >= 65y); Neck trauma (Age >= 65y) EXAM: CT HEAD WITHOUT CONTRAST CT CERVICAL SPINE WITHOUT CONTRAST TECHNIQUE: Multidetector CT imaging of the head and cervical spine was performed following the standard protocol without intravenous contrast. Multiplanar CT image reconstructions of the cervical spine were also generated. RADIATION DOSE REDUCTION:  This exam was performed according to the departmental dose-optimization program which includes automated exposure control, adjustment of the mA and/or kV according to patient size and/or use of iterative reconstruction technique. COMPARISON:  07/03/2021, 01/03/2022 FINDINGS: CT HEAD FINDINGS Brain: No evidence of acute infarction, hemorrhage, hydrocephalus, extra-axial collection or mass lesion/mass effect. Patchy low-density changes within the periventricular and subcortical white matter compatible with chronic microvascular ischemic change. Mild-moderate diffuse cerebral volume loss.  Vascular: No hyperdense vessel or unexpected calcification. Skull: Normal. Negative for fracture or focal lesion. Sinuses/Orbits: No acute finding. Other: None. CT CERVICAL SPINE FINDINGS Alignment: Facet joints are aligned without dislocation or traumatic listhesis. Dens and lateral masses are aligned. Grade 1 anterolisthesis of C2 on C3, C3 on C4, C4 on C5 as well as grade 2 anterolisthesis of C7 on T1, unchanged. Anterolisthesis mediated by degenerative facet joint arthropathy. Skull base and vertebrae: No acute fracture. No primary bone lesion or focal pathologic process. Soft tissues and spinal canal: No prevertebral fluid or swelling. No visible canal hematoma. Disc levels: Severe multilevel cervical spondylosis, similar to prior. Upper chest: Layering bilateral pleural effusions. Ground-glass opacity within the visualized lung apices with interlobular septal thickening. Aortic atherosclerosis. Other: None. IMPRESSION: 1. No acute intracranial abnormality. 2. No acute fracture or traumatic listhesis of the cervical spine. 3. Layering bilateral pleural effusions with findings suggestive of pulmonary edema. Electronically Signed   By: Duanne Guess D.O.   On: 02/10/2022 08:13   CT Cervical Spine Wo Contrast  Result Date: 02/10/2022 CLINICAL DATA:  Head trauma, minor (Age >= 65y); Neck trauma (Age >= 65y) EXAM: CT HEAD  WITHOUT CONTRAST CT CERVICAL SPINE WITHOUT CONTRAST TECHNIQUE: Multidetector CT imaging of the head and cervical spine was performed following the standard protocol without intravenous contrast. Multiplanar CT image reconstructions of the cervical spine were also generated. RADIATION DOSE REDUCTION: This exam was performed according to the departmental dose-optimization program which includes automated exposure control, adjustment of the mA and/or kV according to patient size and/or use of iterative reconstruction technique. COMPARISON:  07/03/2021, 01/03/2022 FINDINGS: CT HEAD FINDINGS Brain: No evidence of acute infarction, hemorrhage, hydrocephalus, extra-axial collection or mass lesion/mass effect. Patchy low-density changes within the periventricular and subcortical white matter compatible with chronic microvascular ischemic change. Mild-moderate diffuse cerebral volume loss. Vascular: No hyperdense vessel or unexpected calcification. Skull: Normal. Negative for fracture or focal lesion. Sinuses/Orbits: No acute finding. Other: None. CT CERVICAL SPINE FINDINGS Alignment: Facet joints are aligned without dislocation or traumatic listhesis. Dens and lateral masses are aligned. Grade 1 anterolisthesis of C2 on C3, C3 on C4, C4 on C5 as well as grade 2 anterolisthesis of C7 on T1, unchanged. Anterolisthesis mediated by degenerative facet joint arthropathy. Skull base and vertebrae: No acute fracture. No primary bone lesion or focal pathologic process. Soft tissues and spinal canal: No prevertebral fluid or swelling. No visible canal hematoma. Disc levels: Severe multilevel cervical spondylosis, similar to prior. Upper chest: Layering bilateral pleural effusions. Ground-glass opacity within the visualized lung apices with interlobular septal thickening. Aortic atherosclerosis. Other: None. IMPRESSION: 1. No acute intracranial abnormality. 2. No acute fracture or traumatic listhesis of the cervical spine. 3. Layering  bilateral pleural effusions with findings suggestive of pulmonary edema. Electronically Signed   By: Duanne Guess D.O.   On: 02/10/2022 08:13   DG Pelvis 1-2 Views  Result Date: 02/10/2022 CLINICAL DATA:  89 year old male status post fall with pain. EXAM: PELVIS - 1-2 VIEW COMPARISON:  CT Abdomen and Pelvis 06/12/2021. FINDINGS: AP view at 0726 hours. Partially visible chronic right hip arthroplasty, right femoral implant. Normal AP alignment. Visible hardware appears intact. Underlying bone mineralization is normal for age. Left femoral head normally located. Grossly intact proximal left femur. No pelvis fracture identified. Chronic dextroconvex lumbar scoliosis with severe disc and endplate degeneration. Negative visible bowel gas pattern. IMPRESSION: No acute fracture or dislocation identified about the pelvis. Chronic right hip arthroplasty. If there is lateralizing hip pain  recommend dedicated hip series. Electronically Signed   By: Odessa Fleming M.D.   On: 02/10/2022 07:50   DG Chest 2 View  Result Date: 02/10/2022 CLINICAL DATA:  89 year old male status post fall with pain. EXAM: CHEST - 2 VIEW COMPARISON:  Portable chest 09/04/2021 and earlier. FINDINGS: Semi upright AP and lateral views of the chest at 0721 hours. Similar lung volumes. Stable cardiac size and mediastinal contours. With some cardiomegaly. Visualized tracheal air column is within normal limits. Increased bilateral pulmonary interstitial opacity, somewhat coarse and asymmetric. No pneumothorax, pleural effusion or consolidation. Widespread degenerative osseous changes. No acute osseous abnormality identified. Paucity of bowel gas in the visible abdomen. Cholecystectomy clips. IMPRESSION: 1. Somewhat low lung volumes but disproportionate increased bilateral pulmonary interstitial opacity, nonspecific. Consider viral/atypical respiratory infection, interstitial edema. No pleural effusion or consolidation. 2. No acute traumatic injury  identified. Electronically Signed   By: Odessa Fleming M.D.   On: 02/10/2022 07:49   DG Forearm Right  Result Date: 02/10/2022 CLINICAL DATA:  Fall with right forearm and elbow lacerations. EXAM: RIGHT FOREARM - 2 VIEW COMPARISON:  None Available. FINDINGS: There is irregularity in the skin in the distal forearm most likely indicating skin lacerations. Vascular calcifications are noted in the ventral distal forearm. The bones demonstrate osteopenia without evidence of fractures. There is advanced degenerative arthrosis of the first [facility] joint, mild narrowing of the radiocarpal joint. IMPRESSION: 1. No evidence of fractures. 2. Osteopenia and degenerative changes. 3. Soft tissue lacerations in the distal forearm. Electronically Signed   By: Almira Bar M.D.   On: 02/10/2022 07:47   DG Elbow Complete Right  Result Date: 02/10/2022 CLINICAL DATA:  89 year old male status post fall with pain. EXAM: RIGHT ELBOW - COMPLETE 3+ VIEW COMPARISON:  None Available. FINDINGS: Maintained alignment and joint spaces. Bone mineralization is within normal limits for age. Radial head appears intact. No joint effusion identified. No acute osseous abnormality identified. Generalized soft tissue swelling. IMPRESSION: Soft tissue swelling. No acute fracture or dislocation identified about the right elbow. Electronically Signed   By: Odessa Fleming M.D.   On: 02/10/2022 07:46   DG Shoulder Left  Result Date: 02/10/2022 CLINICAL DATA:  89 year old male status post fall with pain. EXAM: LEFT SHOULDER - 2+ VIEW COMPARISON:  Chest radiographs 09/04/2021 and earlier. FINDINGS: AP and scapular Y-view demonstrates severe glenohumeral joint space loss with subchondral sclerosis and degenerative spurring. No glenohumeral joint dislocation. Proximal left humerus appears intact. No left clavicle or scapula fracture identified. Grossly stable visible left chest, ribs. IMPRESSION: Severe glenohumeral joint degeneration. No acute fracture or dislocation  identified about the left shoulder. Electronically Signed   By: Odessa Fleming M.D.   On: 02/10/2022 07:45    Labs: BNP (last 3 results) Recent Labs    09/04/21 1342 02/10/22 0900  BNP 146.8* 429.7*   Basic Metabolic Panel: Recent Labs  Lab 02/10/22 0900 02/11/22 0519 02/12/22 0446  NA 140 140 139  K 3.4* 4.5 3.7  CL 107 106 105  CO2 24 23 27   GLUCOSE 95 103* 104*  BUN 16 14 17   CREATININE 0.99 1.09 1.15  CALCIUM 8.4* 8.6* 8.5*   Liver Function Tests: Recent Labs  Lab 02/11/22 0519  AST 21  ALT 16  ALKPHOS 80  BILITOT 1.2  PROT 5.2*  ALBUMIN 3.4*   No results for input(s): "LIPASE", "AMYLASE" in the last 168 hours. No results for input(s): "AMMONIA" in the last 168 hours. CBC: Recent Labs  Lab 02/10/22 0900  02/11/22 0519 02/12/22 0446  WBC 33.1* 30.2* 32.6*  NEUTROABS 19.2*  --   --   HGB 10.0* 10.6* 11.2*  HCT 33.0* 35.5* 37.3*  MCV 77.3* 78.4* 77.5*  PLT 42* 49* 65*   Cardiac Enzymes: Recent Labs  Lab 02/10/22 0900  CKTOTAL 23*   Recent Labs    02/11/22 0915  TSH 1.227   Anemia work up Recent Labs    02/11/22 0915  VITAMINB12 637   Urinalysis    Component Value Date/Time   COLORURINE YELLOW 02/10/2022 1240   APPEARANCEUR CLEAR 02/10/2022 1240   LABSPEC 1.006 02/10/2022 1240   PHURINE 7.0 02/10/2022 1240   GLUCOSEU NEGATIVE 02/10/2022 1240   GLUCOSEU NEGATIVE 07/15/2021 1636   HGBUR NEGATIVE 02/10/2022 1240   HGBUR negative 06/02/2009 1059   BILIRUBINUR NEGATIVE 02/10/2022 1240   BILIRUBINUR positive 08/13/2021 0846   KETONESUR NEGATIVE 02/10/2022 1240   PROTEINUR NEGATIVE 02/10/2022 1240   UROBILINOGEN 0.2 08/13/2021 0846   UROBILINOGEN 0.2 07/15/2021 1636   NITRITE NEGATIVE 02/10/2022 1240   LEUKOCYTESUR NEGATIVE 02/10/2022 1240   Sepsis Labs Recent Labs  Lab 02/10/22 0900 02/11/22 0519 02/12/22 0446  WBC 33.1* 30.2* 32.6*   Microbiology No results found for this or any previous visit (from the past 240 hour(s)).  Time  coordinating discharge: 25 minutes  SIGNED: Lanae Boast, MD  [facility] 02/13/2022, 2:34 PM  If 7PM-7AM, please contact night-coverage [email or web address]

## 2022-02-12 NOTE — Evaluation (Addendum)
Physical Therapy Evaluation Patient Details Name: [name] MRN: [id number] DOB: Aug 17, 1933 Today's Date: 02/12/2022  History of Present Illness  Pt is 89 yo male admitted 02/12/22 with acute CHF.  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  Clinical Impression  Pt admitted with above diagnosis. At baseline, pt was at home with home hospice but was ambulatory in home with assist.  Daughter reports pt with gradually declining mobility requiring increased assist and with very short steps to walk.  He now has home O2, hospital bed, lift chair, RW, bsc, and w/c.  Today, pt requiring min-mod A for transfers and only able to take a couple steps.  He will benefit from PT while hospitalized to help maintain mobility for home and decrease caregiver burden.   Pt currently with functional limitations due to the deficits listed below (see PT Problem List). Pt will benefit from skilled PT to increase their independence and safety with mobility to allow discharge to the venue listed below.    Due to level of assist required, stairs to enter home pt will need transport home.         Recommendations for follow up therapy are one component of a multi-disciplinary discharge planning process, led by the attending physician.  Recommendations may be updated based on patient status, additional functional criteria and insurance authorization.  Follow Up Recommendations No PT follow up (pt is home with hospice)      Assistance Recommended at Discharge Frequent or constant Supervision/Assistance  Patient can return home with the following  A lot of help with walking and/or transfers;A lot of help with bathing/dressing/bathroom    Equipment Recommendations None recommended by PT  Recommendations for Other Services       Functional Status Assessment Patient has had a recent decline in their functional status and  demonstrates the ability to make significant improvements in function in a reasonable and predictable amount of time.     Precautions / Restrictions Precautions Precautions: Fall      Mobility  Bed Mobility Overal bed mobility: Needs Assistance Bed Mobility: Supine to Sit, Sit to Supine     Supine to sit: Mod assist Sit to supine: Mod assist   General bed mobility comments: Pt sleeps in lift chair    Transfers Overall transfer level: Needs assistance Equipment used: Rolling walker (2 wheels) Transfers: Sit to/from Stand Sit to Stand: Min assist           General transfer comment: Stood x 2 with light min A    Ambulation/Gait Ambulation/Gait assistance: Herbalist (Feet): 1 Feet Assistive device: Rolling walker (2 wheels) Gait Pattern/deviations: Step-to pattern, Shuffle Gait velocity: decreased     General Gait Details: Fatigued easily; very short steps but daughter reports that has been recent baseline  Financial trader Rankin (Stroke Patients Only)       Balance Overall balance assessment: Needs assistance Sitting-balance support: Bilateral upper extremity supported Sitting balance-Leahy Scale: Poor Sitting balance - Comments: Requiring UE support and with fatigue/challenges leans posteriorly requiring min A   Standing balance support: Bilateral upper extremity supported Standing balance-Leahy Scale: Poor Standing balance comment: RW and min guard-min A  Pertinent Vitals/Pain Pain Assessment Pain Assessment: No/denies pain    Home Living Family/patient expects to be discharged to:: Private residence Living Arrangements: Children Available Help at Discharge: Available 24 hours/day;Family;Other (Comment);Personal care attendant (home hospice and aides) Type of Home: House Home Access: Stairs to enter Entrance Stairs-Rails: Right;Left Entrance  Stairs-Number of Steps: 2   Home Layout: One level Home Equipment: Conservation officer, nature (2 wheels);Hospital bed;BSC/3in1;Wheelchair - manual Additional Comments: lift chair; Home O2; hospice recently delivered with bed   Family also reports now having bed alarms   Prior Function Prior Level of Function : Needs assist             Mobility Comments: Pt was at home on hospice and reports declining mobility but was still able to ambulate with assist and RW; several recent falls ADLs Comments: Aides was assisting with sponge baths ; family assisting with dressing and toielting     Hand Dominance        Extremity/Trunk Assessment   Upper Extremity Assessment Upper Extremity Assessment: Defer to OT evaluation    Lower Extremity Assessment Lower Extremity Assessment: Generalized weakness    Cervical / Trunk Assessment Cervical / Trunk Assessment: Kyphotic  Communication   Communication: Expressive difficulties  Cognition Arousal/Alertness: Awake/alert Behavior During Therapy: WFL for tasks assessed/performed Overall Cognitive Status: History of cognitive impairments - at baseline                                          General Comments General comments (skin integrity, edema, etc.): Skin with multiple lesions and rash appearance - dtr reports leukemia to his skin    Exercises     Assessment/Plan    PT Assessment Patient needs continued PT services  PT Problem List Decreased strength;Decreased activity tolerance;Decreased balance;Decreased mobility;Cardiopulmonary status limiting activity;Decreased range of motion;Decreased cognition;Decreased knowledge of use of DME;Decreased safety awareness;Decreased knowledge of precautions       PT Treatment Interventions DME instruction;Therapeutic exercise;Wheelchair mobility training;Gait training;Balance training;Functional mobility training;Therapeutic activities;Patient/family education    PT Goals (Current  goals can be found in the Care Plan section)  Acute Rehab PT Goals Patient Stated Goal: daughter reports return home with hospice PT Goal Formulation: With patient/family Time For Goal Achievement: 02/26/22 Potential to Achieve Goals: Poor    Frequency Min 3X/week     Co-evaluation PT/OT/SLP Co-Evaluation/Treatment: Yes Reason for Co-Treatment: For patient/therapist safety (unknown level, suspected +2) PT goals addressed during session: Mobility/safety with mobility         AM-PAC PT "6 Clicks" Mobility  Outcome Measure Help needed turning from your back to your side while in a flat bed without using bedrails?: A Little Help needed moving from lying on your back to sitting on the side of a flat bed without using bedrails?: A Lot Help needed moving to and from a bed to a chair (including a wheelchair)?: A Little Help needed standing up from a chair using your arms (e.g., wheelchair or bedside chair)?: A Little Help needed to walk in hospital room?: Total (limited by distance) Help needed climbing 3-5 steps with a railing? : Total 6 Click Score: 13    End of Session Equipment Utilized During Treatment: Gait belt (provided dtr with gait belt) Activity Tolerance: Patient limited by fatigue Patient left: in bed;with call bell/phone within reach;with bed alarm set;with family/visitor present Nurse Communication: Mobility status PT Visit Diagnosis: Other abnormalities of gait  and mobility (R26.89);Muscle weakness (generalized) (M62.81)    Time: 8811-0315 PT Time Calculation (min) (ACUTE ONLY): 25 min   Charges:   PT Evaluation $PT Eval Low Complexity: 1 Low          Darlinda Bellows, PT Acute Rehab [facility] Rehab [phone]   Karlton Lemon 02/12/2022, 3:30 PM

## 2022-02-12 NOTE — TOC Progression Note (Signed)
Transition of Care [facility]) - Progression Note    Patient Details  Name: [name] MRN: [id number] Date of Birth: [birth year]  [facility]) CM/SW [place], LCSW Phone Number: 02/12/2022, 3:25 PM  Clinical Narrative:     [facility] following pt for Hospice services. Pt will d/c back home with home hospice. Family requesting EMS transport. [facility] called. TOC sign off.        Expected Discharge Plan and Services                                               Social Determinants of Health (SDOH) Interventions SDOH Screenings   Food Insecurity: No Food Insecurity (02/11/2022)  Housing: Low Risk  (02/11/2022)  Transportation Needs: No Transportation Needs (02/11/2022)  Utilities: Not At Risk (02/11/2022)  Alcohol Screen: Low Risk  (09/17/2021)  Depression (PHQ2-9): High Risk (08/21/2021)  Financial Resource Strain: Low Risk  (08/21/2021)  Physical Activity: Inactive (08/21/2021)  Stress: Stress Concern Present (08/21/2021)  Tobacco Use: Medium Risk (02/10/2022)    Readmission Risk Interventions     No data to display

## 2022-02-15 ENCOUNTER — Telehealth: Payer: Self-pay | Admitting: General Practice

## 2022-02-15 NOTE — Telephone Encounter (Signed)
Patient daughter [name] called in and stated that Dois had a fall last week and was in the hospital Friday night. She wanted to let you know he will probably not be coming in for anymore appointments as he is now on hospice. Thank you!

## 2022-02-15 NOTE — Telephone Encounter (Signed)
Noted.  I do wish he and his family the very best.

## 2022-02-15 NOTE — Telephone Encounter (Signed)
[name] canceled appointment patient had for tomorrow.

## 2022-02-16 ENCOUNTER — Ambulatory Visit: Payer: Medicare HMO | Admitting: Primary Care

## 2022-03-19 DEATH — deceased

## 2022-03-25 ENCOUNTER — Ambulatory Visit: Payer: Medicare HMO | Admitting: Podiatry
